# Patient Record
Sex: Male | Born: 1965
Health system: Southern US, Community
[De-identification: ages and names within clinical notes are randomized; demographics above are authoritative.]

## PROBLEM LIST (undated history)

## (undated) DIAGNOSIS — R7881 Bacteremia: Secondary | ICD-10-CM

## (undated) DIAGNOSIS — J189 Pneumonia, unspecified organism: Secondary | ICD-10-CM

## (undated) DIAGNOSIS — N289 Disorder of kidney and ureter, unspecified: Secondary | ICD-10-CM

## (undated) DIAGNOSIS — Z992 Dependence on renal dialysis: Secondary | ICD-10-CM

## (undated) DIAGNOSIS — I10 Essential (primary) hypertension: Secondary | ICD-10-CM

## (undated) HISTORY — PX: CHEST TUBE INSERTION: SHX231

## (undated) HISTORY — PX: AMPUTATION TOE: SHX6595

---

## 2002-08-05 ENCOUNTER — Ambulatory Visit (HOSPITAL_COMMUNITY): Admission: RE | Admit: 2002-08-05 | Discharge: 2002-08-05 | Payer: Self-pay | Admitting: Orthopedic Surgery

## 2002-08-05 ENCOUNTER — Encounter: Payer: Self-pay | Admitting: Orthopedic Surgery

## 2012-02-21 DIAGNOSIS — J189 Pneumonia, unspecified organism: Secondary | ICD-10-CM

## 2012-02-21 HISTORY — DX: Pneumonia, unspecified organism: J18.9

## 2012-03-11 ENCOUNTER — Encounter (HOSPITAL_BASED_OUTPATIENT_CLINIC_OR_DEPARTMENT_OTHER): Payer: Self-pay

## 2012-03-11 ENCOUNTER — Emergency Department (INDEPENDENT_AMBULATORY_CARE_PROVIDER_SITE_OTHER): Payer: Managed Care, Other (non HMO)

## 2012-03-11 ENCOUNTER — Inpatient Hospital Stay (HOSPITAL_BASED_OUTPATIENT_CLINIC_OR_DEPARTMENT_OTHER)
Admission: EM | Admit: 2012-03-11 | Discharge: 2012-03-16 | DRG: 871 | Disposition: A | Discharge status: Home Health | Payer: Managed Care, Other (non HMO) | Attending: Internal Medicine | Admitting: Internal Medicine

## 2012-03-11 DIAGNOSIS — J189 Pneumonia, unspecified organism: Secondary | ICD-10-CM

## 2012-03-11 DIAGNOSIS — D631 Anemia in chronic kidney disease: Secondary | ICD-10-CM | POA: Diagnosis present

## 2012-03-11 DIAGNOSIS — R509 Fever, unspecified: Secondary | ICD-10-CM

## 2012-03-11 DIAGNOSIS — N189 Chronic kidney disease, unspecified: Secondary | ICD-10-CM | POA: Diagnosis present

## 2012-03-11 DIAGNOSIS — E876 Hypokalemia: Secondary | ICD-10-CM | POA: Diagnosis not present

## 2012-03-11 DIAGNOSIS — E119 Type 2 diabetes mellitus without complications: Secondary | ICD-10-CM | POA: Diagnosis present

## 2012-03-11 DIAGNOSIS — B953 Streptococcus pneumoniae as the cause of diseases classified elsewhere: Secondary | ICD-10-CM | POA: Diagnosis present

## 2012-03-11 DIAGNOSIS — J13 Pneumonia due to Streptococcus pneumoniae: Secondary | ICD-10-CM | POA: Diagnosis present

## 2012-03-11 DIAGNOSIS — E1129 Type 2 diabetes mellitus with other diabetic kidney complication: Secondary | ICD-10-CM | POA: Diagnosis present

## 2012-03-11 DIAGNOSIS — E46 Unspecified protein-calorie malnutrition: Secondary | ICD-10-CM | POA: Diagnosis not present

## 2012-03-11 DIAGNOSIS — N179 Acute kidney failure, unspecified: Secondary | ICD-10-CM | POA: Diagnosis not present

## 2012-03-11 DIAGNOSIS — N039 Chronic nephritic syndrome with unspecified morphologic changes: Secondary | ICD-10-CM | POA: Diagnosis present

## 2012-03-11 DIAGNOSIS — R05 Cough: Secondary | ICD-10-CM

## 2012-03-11 DIAGNOSIS — K7200 Acute and subacute hepatic failure without coma: Secondary | ICD-10-CM | POA: Diagnosis present

## 2012-03-11 DIAGNOSIS — R5381 Other malaise: Secondary | ICD-10-CM

## 2012-03-11 DIAGNOSIS — E785 Hyperlipidemia, unspecified: Secondary | ICD-10-CM | POA: Diagnosis present

## 2012-03-11 DIAGNOSIS — J154 Pneumonia due to other streptococci: Secondary | ICD-10-CM | POA: Diagnosis present

## 2012-03-11 DIAGNOSIS — R059 Cough, unspecified: Secondary | ICD-10-CM

## 2012-03-11 DIAGNOSIS — R7989 Other specified abnormal findings of blood chemistry: Secondary | ICD-10-CM | POA: Diagnosis present

## 2012-03-11 DIAGNOSIS — E1165 Type 2 diabetes mellitus with hyperglycemia: Secondary | ICD-10-CM | POA: Diagnosis present

## 2012-03-11 DIAGNOSIS — Y9210 Unspecified residential institution as the place of occurrence of the external cause: Secondary | ICD-10-CM | POA: Diagnosis not present

## 2012-03-11 DIAGNOSIS — R7881 Bacteremia: Secondary | ICD-10-CM | POA: Diagnosis present

## 2012-03-11 DIAGNOSIS — R918 Other nonspecific abnormal finding of lung field: Secondary | ICD-10-CM

## 2012-03-11 DIAGNOSIS — T50995A Adverse effect of other drugs, medicaments and biological substances, initial encounter: Secondary | ICD-10-CM | POA: Diagnosis not present

## 2012-03-11 DIAGNOSIS — R339 Retention of urine, unspecified: Secondary | ICD-10-CM | POA: Diagnosis not present

## 2012-03-11 DIAGNOSIS — R0602 Shortness of breath: Secondary | ICD-10-CM

## 2012-03-11 DIAGNOSIS — A409 Streptococcal sepsis, unspecified: Principal | ICD-10-CM | POA: Diagnosis present

## 2012-03-11 DIAGNOSIS — R079 Chest pain, unspecified: Secondary | ICD-10-CM

## 2012-03-11 DIAGNOSIS — N170 Acute kidney failure with tubular necrosis: Secondary | ICD-10-CM | POA: Diagnosis not present

## 2012-03-11 DIAGNOSIS — E871 Hypo-osmolality and hyponatremia: Secondary | ICD-10-CM | POA: Diagnosis present

## 2012-03-11 LAB — URINALYSIS, ROUTINE W REFLEX MICROSCOPIC
Glucose, UA: >1000 mg/dL — AB
Ketones, ur: 15 mg/dL — AB
Leukocytes, UA: NEGATIVE
Nitrite: NEGATIVE
Protein, ur: >300 mg/dL — AB
Specific Gravity, Urine: 1.027 (ref 1.005–1.030)
Urobilinogen, UA: 4 mg/dL — ABNORMAL HIGH (ref 0.0–1.0)
pH: 5.5 (ref 5.0–8.0)

## 2012-03-11 LAB — BASIC METABOLIC PANEL
BUN: 29 mg/dL — ABNORMAL HIGH (ref 6–23)
CO2: 27 mEq/L (ref 19–32)
Calcium: 8.3 mg/dL — ABNORMAL LOW (ref 8.4–10.5)
Chloride: 87 mEq/L — ABNORMAL LOW (ref 96–112)
Creatinine, Ser: 1.1 mg/dL (ref 0.50–1.35)
GFR calc Af Amer: >90 mL/min (ref >90)
GFR calc non Af Amer: 79 mL/min — ABNORMAL LOW (ref >90)
Glucose, Bld: 358 mg/dL — ABNORMAL HIGH (ref 70–99)
Potassium: 4.2 mEq/L (ref 3.5–5.1)
Sodium: 125 mEq/L — ABNORMAL LOW (ref 135–145)

## 2012-03-11 LAB — CBC
HCT: 28.3 % — ABNORMAL LOW (ref 39.0–52.0)
Hemoglobin: 10 g/dL — ABNORMAL LOW (ref 13.0–17.0)
MCH: 28.6 pg (ref 26.0–34.0)
MCHC: 35.3 g/dL (ref 30.0–36.0)
MCV: 80.9 fL (ref 78.0–100.0)
Platelets: 224 10*3/uL (ref 150–400)
RBC: 3.5 MIL/uL — ABNORMAL LOW (ref 4.22–5.81)
RDW: 10.9 % — ABNORMAL LOW (ref 11.5–15.5)
WBC: 10.1 10*3/uL (ref 4.0–10.5)

## 2012-03-11 LAB — GLUCOSE, CAPILLARY
Glucose-Capillary: 371 mg/dL — ABNORMAL HIGH (ref 70–99)
Glucose-Capillary: 303 mg/dL — ABNORMAL HIGH (ref 70–99)

## 2012-03-11 LAB — URINE MICROSCOPIC-ADD ON

## 2012-03-11 MED ORDER — INSULIN REGULAR HUMAN 100 UNIT/ML IJ SOLN
10.0000 [IU] | Freq: Once | INTRAMUSCULAR | Status: AC
  Administered 2012-03-11: 10 [IU] via INTRAVENOUS

## 2012-03-11 MED ORDER — HYDROCODONE-ACETAMINOPHEN 5-325 MG PO TABS
1.0000 | ORAL_TABLET | Freq: Once | ORAL | Status: AC
  Administered 2012-03-11: 1 via ORAL
  Filled 2012-03-11: qty 1

## 2012-03-11 MED ORDER — ACETAMINOPHEN 325 MG PO TABS
650.0000 mg | ORAL_TABLET | Freq: Once | ORAL | Status: AC
  Administered 2012-03-11: 650 mg via ORAL
  Filled 2012-03-11: qty 2

## 2012-03-11 MED ORDER — DEXTROSE 5 % IV SOLN
500.0000 mg | Freq: Once | INTRAVENOUS | Status: AC
  Administered 2012-03-11: 500 mg via INTRAVENOUS
  Filled 2012-03-11: qty 500

## 2012-03-11 MED ORDER — DEXTROSE 5 % IV SOLN
1.0000 g | Freq: Once | INTRAVENOUS | Status: AC
  Administered 2012-03-11: 1 g via INTRAVENOUS
  Filled 2012-03-11: qty 10

## 2012-03-11 MED ORDER — HYDROCODONE-ACETAMINOPHEN 5-325 MG PO TABS
1.0000 | ORAL_TABLET | Freq: Once | ORAL | Status: AC
  Administered 2012-03-11: 1 via ORAL

## 2012-03-11 MED ORDER — IOHEXOL 350 MG/ML SOLN
80.0000 mL | Freq: Once | INTRAVENOUS | Status: AC | PRN
  Administered 2012-03-11: 80 mL via INTRAVENOUS

## 2012-03-11 MED ORDER — HYDROCODONE-ACETAMINOPHEN 5-325 MG PO TABS
ORAL_TABLET | ORAL | Status: AC
  Filled 2012-03-11: qty 1

## 2012-03-11 MED ORDER — SODIUM CHLORIDE 0.9 % IV BOLUS (SEPSIS)
500.0000 mL | Freq: Once | INTRAVENOUS | Status: AC
  Administered 2012-03-11: 13:00:00 via INTRAVENOUS

## 2012-03-11 MED ORDER — SODIUM CHLORIDE 0.9 % IV BOLUS (SEPSIS)
1000.0000 mL | Freq: Once | INTRAVENOUS | Status: AC
  Administered 2012-03-11: 1000 mL via INTRAVENOUS

## 2012-03-11 MED ORDER — INSULIN REGULAR HUMAN 100 UNIT/ML IJ SOLN
INTRAMUSCULAR | Status: AC
  Filled 2012-03-11: qty 10

## 2012-03-11 NOTE — ED Notes (Signed)
EMTALA consent obtained from pt. Awaiting admission bed.  Pt and family informed of plan of care.

## 2012-03-11 NOTE — ED Notes (Signed)
Pt reports generalized weakness, weight loss, fever, cough and recent diagnosis of Influenza A.  Pt is present ly being treated with PO medications but not getting better.

## 2012-03-11 NOTE — ED Notes (Signed)
Pt returned from radiology.

## 2012-03-11 NOTE — ED Notes (Signed)
Pt reports illness since 03/08/12, diagnosed with ear infection and flu.  Taking Tamiflu but not improving.

## 2012-03-11 NOTE — ED Notes (Signed)
Report given to floor. Spoke to [employer]. [facility] also arrived for transport.

## 2012-03-11 NOTE — ED Notes (Signed)
Patient transported to CT via stretcher.

## 2012-03-11 NOTE — ED Provider Notes (Signed)
History     CSN: [id number]  Arrival date & time 03/11/12  1230   First MD Initiated Contact with Patient 03/11/12 1240      Chief Complaint  Patient presents with  . Abdominal Pain  . Weakness  . Cough  . Fever    (Consider location/radiation/quality/duration/timing/severity/associated sxs/prior treatment) HPI Comments: Pt states that he as diagnosed with the flu 3 days ago at [facility] and has been taking tamiflu and has not been feeling better:pt states that he went to see his pcp this morning and was sent over here for further evaluation;Pt states that he had a lot of vomiting 2 days ago, but has not had any in the last 24 hours:pt states that his blood sugar was in the 300's at his pcp today:pt states that he is having right lower rib pain and generalized weakness  Patient is a 46 y.o. male presenting with fever. The history is provided by the patient and a relative.  Fever Primary symptoms of the febrile illness include fever, cough, vomiting and myalgias. Primary symptoms do not include shortness of breath, abdominal pain, diarrhea, dysuria or rash. The current episode started 3 to 5 days ago. This is a new problem. The problem has been gradually worsening.  Risk factors for febrile illness include diabetes mellitus.   Past Medical History  Diagnosis Date  . Flu   . Diabetes mellitus     History reviewed. No pertinent past surgical history.  No family history on file.  History  Substance Use Topics  . Smoking status: Never Smoker   . Smokeless tobacco: Never Used  . Alcohol Use: No      Review of Systems  Constitutional: Positive for fever.  Respiratory: Positive for cough. Negative for shortness of breath.   Gastrointestinal: Positive for vomiting. Negative for abdominal pain and diarrhea.  Genitourinary: Negative for dysuria.  Musculoskeletal: Positive for myalgias.  Skin: Negative for rash.  All other systems reviewed and are negative.    Allergies    Penicillins  Home Medications   Current Outpatient Rx  Name Route Sig Dispense Refill  . ASPIRIN 81 MG PO TABS Oral Take 81 mg by mouth daily.    . AZITHROMYCIN 250 MG PO TABS Oral Take 250 mg by mouth daily.    . INSULIN ISOPHANE HUMAN 100 UNIT/ML [place] SUSP Subcutaneous Inject 15 Units into the skin.    . INSULIN REGULAR HUMAN 100 UNIT/ML IJ SOLN Subcutaneous Inject 15 Units into the skin every morning.    Marland Kitchen LISINOPRIL 5 MG PO TABS Oral Take 5 mg by mouth daily.    . MULTIVITAMINS PO CAPS Oral Take 1 capsule by mouth daily.    . OSELTAMIVIR PHOSPHATE 75 MG PO CAPS Oral Take 75 mg by mouth 2 (two) times daily.    Marland Kitchen ROSUVASTATIN CALCIUM 10 MG PO TABS Oral Take 10 mg by mouth daily.      BP 142/90  Pulse 124  Temp(Src) 100.2 F (37.9 C) (Oral)  Resp 20  Ht 6\' 2"  (1.88 m)  Wt 185 lb (83.915 kg)  BMI 23.75 kg/m2  SpO2 91%  Physical Exam  Nursing note and vitals reviewed. Constitutional: He is oriented to person, place, and time. He appears well-developed and well-nourished.  HENT:  Head: Normocephalic and atraumatic.  Right Ear: External ear normal.  Left Ear: External ear normal.  Mouth/Throat: Oropharynx is clear and moist.  Eyes: Conjunctivae and EOM are normal.  Neck: Neck supple.  Cardiovascular: Normal rate and  regular rhythm.   Pulmonary/Chest: Effort normal. He exhibits tenderness.       Pt tender on the right lower ribs:decreased lung sounds on the  Right middle and lower lobe  Abdominal: Soft. Bowel sounds are normal. There is no tenderness.  Musculoskeletal: Normal range of motion.  Neurological: He is alert and oriented to person, place, and time.  Skin: Skin is warm and dry.  Psychiatric: He has a normal mood and affect.    ED Course  Procedures (including critical care time)  Labs Reviewed  CBC - Abnormal; Notable for the following:    RBC 3.50 (*)    Hemoglobin 10.0 (*)    HCT 28.3 (*)    RDW 10.9 (*)    All other components within normal limits   BASIC METABOLIC PANEL - Abnormal; Notable for the following:    Sodium 125 (*)    Chloride 87 (*)    Glucose, Bld 358 (*)    BUN 29 (*)    Calcium 8.3 (*)    GFR calc non Af Amer 79 (*)    All other components within normal limits  URINALYSIS, ROUTINE W REFLEX MICROSCOPIC  CULTURE, BLOOD (ROUTINE X 2)  CULTURE, BLOOD (ROUTINE X 2)   Dg Chest 2 View  03/11/2012  *RADIOLOGY REPORT*  Clinical Data: Shortness of breath with right lower chest pain and fever.  CHEST - 2 VIEW  Comparison: None.  Findings: Trachea is midline.  Heart size normal.  There is dense airspace consolidation in the right lower lobe.  Question mild left lower lobe air space disease.  No left pleural fluid.  IMPRESSION:  1.  Dense airspace consolidation in the right lower lobe is most consistent with pneumonia.  Follow-up to clearing is recommended, to exclude a centrally obstructing mass. 2.  Question mild left lower lobe air space disease.  Original Report Authenticated By: Luretha Rued, M.D.     1. Community acquired pneumonia   2. Diabetes mellitus       MDM  5:16 PM Dr reddy accepted but is requesting ct chest 5:58 PM Ct angio negative:pt to go to medical floor:pt needs admission as is unable to hold oxygen saturation        Glendell Docker, NP 03/11/12 1759

## 2012-03-11 NOTE — ED Notes (Signed)
Glendell Docker, FNP at bedside speaking with pt.

## 2012-03-11 NOTE — ED Notes (Signed)
Pt's girlfriend, Honor Loh, to be notified once pt is transferred. (home#) [phone] or [phone]. Pt given ice water to drink. C/o continued right rib pain. NP made aware and no new orders given.

## 2012-03-11 NOTE — ED Notes (Signed)
No available admission beds at this time.

## 2012-03-11 NOTE — ED Notes (Signed)
Pt returns from CT.  No change in pt condition. Awaiting admission bed.

## 2012-03-11 NOTE — ED Notes (Signed)
Patient transported to X-ray 

## 2012-03-12 ENCOUNTER — Encounter (HOSPITAL_COMMUNITY): Payer: Self-pay | Admitting: Internal Medicine

## 2012-03-12 ENCOUNTER — Inpatient Hospital Stay (HOSPITAL_COMMUNITY): Payer: Managed Care, Other (non HMO)

## 2012-03-12 DIAGNOSIS — J13 Pneumonia due to Streptococcus pneumoniae: Secondary | ICD-10-CM | POA: Diagnosis present

## 2012-03-12 DIAGNOSIS — E119 Type 2 diabetes mellitus without complications: Secondary | ICD-10-CM | POA: Diagnosis present

## 2012-03-12 LAB — PROTIME-INR
INR: 1.12 (ref 0.00–1.49)
Prothrombin Time: 14.6 seconds (ref 11.6–15.2)

## 2012-03-12 LAB — URINALYSIS, ROUTINE W REFLEX MICROSCOPIC
Glucose, UA: >1000 mg/dL — AB
Ketones, ur: NEGATIVE mg/dL
Nitrite: NEGATIVE
Protein, ur: >300 mg/dL — AB
Specific Gravity, Urine: 1.039 — ABNORMAL HIGH (ref 1.005–1.030)
Urobilinogen, UA: 2 mg/dL — ABNORMAL HIGH (ref 0.0–1.0)
pH: 5 (ref 5.0–8.0)

## 2012-03-12 LAB — COMPREHENSIVE METABOLIC PANEL
ALT: 78 U/L — ABNORMAL HIGH (ref 0–53)
AST: 60 U/L — ABNORMAL HIGH (ref 0–37)
Albumin: 1.6 g/dL — ABNORMAL LOW (ref 3.5–5.2)
Alkaline Phosphatase: 390 U/L — ABNORMAL HIGH (ref 39–117)
BUN: 50 mg/dL — ABNORMAL HIGH (ref 6–23)
CO2: 21 mEq/L (ref 19–32)
Calcium: 8.1 mg/dL — ABNORMAL LOW (ref 8.4–10.5)
Chloride: 89 mEq/L — ABNORMAL LOW (ref 96–112)
Creatinine, Ser: 2 mg/dL — ABNORMAL HIGH (ref 0.50–1.35)
GFR calc Af Amer: 45 mL/min — ABNORMAL LOW (ref >90)
GFR calc non Af Amer: 39 mL/min — ABNORMAL LOW (ref >90)
Glucose, Bld: 371 mg/dL — ABNORMAL HIGH (ref 70–99)
Potassium: 3.9 mEq/L (ref 3.5–5.1)
Sodium: 127 mEq/L — ABNORMAL LOW (ref 135–145)
Total Bilirubin: 3.2 mg/dL — ABNORMAL HIGH (ref 0.3–1.2)
Total Protein: 6.4 g/dL (ref 6.0–8.3)

## 2012-03-12 LAB — HIV ANTIBODY (ROUTINE TESTING W REFLEX): HIV: NONREACTIVE

## 2012-03-12 LAB — URINE MICROSCOPIC-ADD ON

## 2012-03-12 LAB — CBC
HCT: 26 % — ABNORMAL LOW (ref 39.0–52.0)
Hemoglobin: 8.9 g/dL — ABNORMAL LOW (ref 13.0–17.0)
MCH: 28.5 pg (ref 26.0–34.0)
MCHC: 34.2 g/dL (ref 30.0–36.0)
MCV: 83.3 fL (ref 78.0–100.0)
Platelets: 233 10*3/uL (ref 150–400)
RBC: 3.12 MIL/uL — ABNORMAL LOW (ref 4.22–5.81)
RDW: 12.1 % (ref 11.5–15.5)
WBC: 14.8 10*3/uL — ABNORMAL HIGH (ref 4.0–10.5)

## 2012-03-12 LAB — GLUCOSE, CAPILLARY
Glucose-Capillary: 410 mg/dL — ABNORMAL HIGH (ref 70–99)
Glucose-Capillary: 357 mg/dL — ABNORMAL HIGH (ref 70–99)
Glucose-Capillary: 344 mg/dL — ABNORMAL HIGH (ref 70–99)
Glucose-Capillary: 333 mg/dL — ABNORMAL HIGH (ref 70–99)

## 2012-03-12 LAB — SODIUM, URINE, RANDOM: Sodium, Ur: <10 mEq/L

## 2012-03-12 LAB — HEMOGLOBIN A1C
Hgb A1c MFr Bld: 15.7 % — ABNORMAL HIGH (ref <5.7)
Mean Plasma Glucose: 404 mg/dL — ABNORMAL HIGH (ref <117)

## 2012-03-12 LAB — AMMONIA: Ammonia: 22 umol/L (ref 11–60)

## 2012-03-12 MED ORDER — ATORVASTATIN CALCIUM 20 MG PO TABS
20.0000 mg | ORAL_TABLET | Freq: Every day | ORAL | Status: DC
  Filled 2012-03-12: qty 1

## 2012-03-12 MED ORDER — ENOXAPARIN SODIUM 40 MG/0.4ML ~~LOC~~ SOLN
40.0000 mg | SUBCUTANEOUS | Status: DC
  Administered 2012-03-12 – 2012-03-16 (×4): 40 mg via SUBCUTANEOUS
  Filled 2012-03-12 (×5): qty 0.4

## 2012-03-12 MED ORDER — INSULIN GLARGINE 100 UNIT/ML ~~LOC~~ SOLN
10.0000 [IU] | Freq: Every day | SUBCUTANEOUS | Status: DC
  Administered 2012-03-12: 10 [IU] via SUBCUTANEOUS

## 2012-03-12 MED ORDER — ASPIRIN 81 MG PO CHEW
81.0000 mg | CHEWABLE_TABLET | Freq: Every day | ORAL | Status: DC
  Administered 2012-03-12 – 2012-03-16 (×5): 81 mg via ORAL
  Filled 2012-03-12 (×5): qty 1

## 2012-03-12 MED ORDER — INSULIN ASPART 100 UNIT/ML ~~LOC~~ SOLN
0.0000 [IU] | Freq: Every day | SUBCUTANEOUS | Status: DC
  Administered 2012-03-12: 4 [IU] via SUBCUTANEOUS
  Administered 2012-03-13 – 2012-03-14 (×2): 3 [IU] via SUBCUTANEOUS
  Administered 2012-03-15: 4 [IU] via SUBCUTANEOUS

## 2012-03-12 MED ORDER — INSULIN ASPART 100 UNIT/ML ~~LOC~~ SOLN: 15.0000 [IU] | Freq: Every day | SUBCUTANEOUS | Status: DC

## 2012-03-12 MED ORDER — INSULIN ASPART 100 UNIT/ML ~~LOC~~ SOLN
10.0000 [IU] | Freq: Once | SUBCUTANEOUS | Status: AC
  Administered 2012-03-12: 10 [IU] via SUBCUTANEOUS

## 2012-03-12 MED ORDER — OSELTAMIVIR PHOSPHATE 75 MG PO CAPS
75.0000 mg | ORAL_CAPSULE | Freq: Two times a day (BID) | ORAL | Status: DC
  Administered 2012-03-12: 75 mg via ORAL
  Filled 2012-03-12 (×3): qty 1

## 2012-03-12 MED ORDER — ONDANSETRON HCL 4 MG PO TABS: 4.0000 mg | ORAL_TABLET | Freq: Four times a day (QID) | ORAL | Status: DC | PRN

## 2012-03-12 MED ORDER — INSULIN ASPART 100 UNIT/ML ~~LOC~~ SOLN
0.0000 [IU] | Freq: Three times a day (TID) | SUBCUTANEOUS | Status: DC
  Administered 2012-03-12: 15 [IU] via SUBCUTANEOUS
  Administered 2012-03-12 – 2012-03-13 (×2): 11 [IU] via SUBCUTANEOUS
  Administered 2012-03-13: 15 [IU] via SUBCUTANEOUS
  Administered 2012-03-13 – 2012-03-14 (×2): 11 [IU] via SUBCUTANEOUS
  Administered 2012-03-14: 5 [IU] via SUBCUTANEOUS
  Administered 2012-03-15: 8 [IU] via SUBCUTANEOUS
  Administered 2012-03-15: 5 [IU] via SUBCUTANEOUS
  Administered 2012-03-16: 11 [IU] via SUBCUTANEOUS
  Administered 2012-03-16: 8 [IU] via SUBCUTANEOUS

## 2012-03-12 MED ORDER — DEXTROSE 5 % IV SOLN
1.0000 g | INTRAVENOUS | Status: DC
  Administered 2012-03-12: 1 g via INTRAVENOUS
  Filled 2012-03-12 (×2): qty 10

## 2012-03-12 MED ORDER — ACETAMINOPHEN 650 MG RE SUPP: 650.0000 mg | Freq: Four times a day (QID) | RECTAL | Status: DC | PRN

## 2012-03-12 MED ORDER — INSULIN ASPART 100 UNIT/ML ~~LOC~~ SOLN: 0.0000 [IU] | Freq: Three times a day (TID) | SUBCUTANEOUS | Status: DC

## 2012-03-12 MED ORDER — ONDANSETRON HCL 4 MG/2ML IJ SOLN
4.0000 mg | Freq: Four times a day (QID) | INTRAMUSCULAR | Status: DC | PRN
  Administered 2012-03-13: 4 mg via INTRAVENOUS
  Filled 2012-03-12: qty 2

## 2012-03-12 MED ORDER — ADULT MULTIVITAMIN W/MINERALS CH
1.0000 | ORAL_TABLET | Freq: Every day | ORAL | Status: DC
  Administered 2012-03-12 – 2012-03-16 (×5): 1 via ORAL
  Filled 2012-03-12 (×5): qty 1

## 2012-03-12 MED ORDER — INSULIN NPH (HUMAN) (ISOPHANE) 100 UNIT/ML ~~LOC~~ SUSP
35.0000 [IU] | Freq: Two times a day (BID) | SUBCUTANEOUS | Status: DC
  Filled 2012-03-12: qty 10

## 2012-03-12 MED ORDER — INSULIN REGULAR HUMAN 100 UNIT/ML IJ SOLN: 10.0000 [IU] | Freq: Once | INTRAMUSCULAR | Status: DC

## 2012-03-12 MED ORDER — LISINOPRIL 5 MG PO TABS
5.0000 mg | ORAL_TABLET | Freq: Every day | ORAL | Status: DC
  Administered 2012-03-12: 5 mg via ORAL
  Filled 2012-03-12: qty 1

## 2012-03-12 MED ORDER — ACETAMINOPHEN 325 MG PO TABS: 650.0000 mg | ORAL_TABLET | Freq: Four times a day (QID) | ORAL | Status: DC | PRN

## 2012-03-12 MED ORDER — SODIUM CHLORIDE 0.9 % IV SOLN
INTRAVENOUS | Status: DC
  Administered 2012-03-12 – 2012-03-13 (×5): via INTRAVENOUS

## 2012-03-12 MED ORDER — INSULIN REGULAR HUMAN 100 UNIT/ML IJ SOLN: 15.0000 [IU] | Freq: Every morning | INTRAMUSCULAR | Status: DC

## 2012-03-12 MED ORDER — OXYCODONE HCL 5 MG PO TABS: 5.0000 mg | ORAL_TABLET | ORAL | Status: DC | PRN

## 2012-03-12 MED ORDER — AZITHROMYCIN 500 MG IV SOLR
500.0000 mg | INTRAVENOUS | Status: DC
  Administered 2012-03-12: 500 mg via INTRAVENOUS
  Filled 2012-03-12 (×2): qty 500

## 2012-03-12 MED ORDER — SODIUM CHLORIDE 0.9 % IV SOLN
750.0000 mg | Freq: Two times a day (BID) | INTRAVENOUS | Status: DC
  Administered 2012-03-12 – 2012-03-13 (×2): 750 mg via INTRAVENOUS
  Filled 2012-03-12 (×3): qty 750

## 2012-03-12 MED ORDER — INSULIN GLARGINE 100 UNIT/ML ~~LOC~~ SOLN: 5.0000 [IU] | Freq: Every day | SUBCUTANEOUS | Status: DC

## 2012-03-12 NOTE — Progress Notes (Signed)
ANTIBIOTIC CONSULT NOTE - INITIAL  Pharmacy Consult for Vancomycin Indication: Gram positive cocci Sepsis and  Community-acquired pneumonia    Allergies  Allergen Reactions  . Penicillins Rash    Patient Measurements: Height: 6' 0.2" (183.4 cm) Weight: 185 lb 10 oz (84.2 kg) IBW/kg (Calculated) : 78.06    Vital Signs: Temp: 99.5 F (37.5 C) (03/21 0459) Temp src: Oral (03/21 0459) BP: 159/98 mmHg (03/21 1039) Pulse Rate: 94  (03/21 1039) Intake/Output from previous day: 03/20 0701 - 03/21 0700 In: 1300 [I.V.:1300] Out: 200 [Urine:200] Intake/Output from this shift: Total I/O In: 118 [P.O.:118] Out: -   Labs:  Basename 03/12/12 0940 03/11/12 1300  WBC 14.8* 10.1  HGB 8.9* 10.0*  PLT 233 224  LABCREA -- --  CREATININE 2.00* 1.10   Estimated Creatinine Clearance: 51.5 ml/min (by C-G formula based on Cr of 2). No results found for this basename: VANCOTROUGH:2,VANCOPEAK:2,VANCORANDOM:2,GENTTROUGH:2,GENTPEAK:2,GENTRANDOM:2,TOBRATROUGH:2,TOBRAPEAK:2,TOBRARND:2,AMIKACINPEAK:2,AMIKACINTROU:2,AMIKACIN:2, in the last 72 hours   Microbiology: Recent Results (from the past 720 hour(s))  CULTURE, BLOOD (ROUTINE X 2)     Status: Normal (Preliminary result)   Collection Time   03/11/12  1:00 PM      Component Value Range Status Comment   Specimen Description BLOOD RIGHT ANTECUBITAL   Final    Special Requests NONE BOTTLES DRAWN AEROBIC AND ANAEROBIC [facility]   Final    Culture  Setup Time [id number]   Final    Culture     Final    Value: GRAM POSITIVE COCCI IN PAIRS     Note: Gram Stain Report Called to,Read Back By and Verified With: COURTNEY DRIGGERS @ [id number] 03/12/12 WICKN   Report Status PENDING   Incomplete   CULTURE, BLOOD (ROUTINE X 2)     Status: Normal (Preliminary result)   Collection Time   03/11/12  2:10 PM      Component Value Range Status Comment   Specimen Description BLOOD RIGHT HAND   Final    Special Requests NONE BOTTLES DRAWN AEROBIC AND ANAEROBIC [facility]   Final    Culture  Setup Time [id number]   Final    Culture     Final    Value: GRAM POSITIVE COCCI IN PAIRS     Note: Gram Stain Report Called to,Read Back By and Verified WithHuel Cote @ [id number] 03/12/12 WICKN   Report Status PENDING   Incomplete     Medical History: Past Medical History  Diagnosis Date  . Flu   . Diabetes mellitus     Medications:  Prescriptions prior to admission  Medication Sig Dispense Refill  . aspirin 81 MG tablet Take 81 mg by mouth daily.      Marland Kitchen azithromycin (ZITHROMAX) 250 MG tablet Take 250 mg by mouth daily.      . insulin NPH (HUMULIN N,NOVOLIN N) 100 UNIT/ML injection Inject 15 Units into the skin.      Marland Kitchen insulin regular (NOVOLIN R,HUMULIN R) 100 units/mL injection Inject 15 Units into the skin every morning.      Marland Kitchen lisinopril (PRINIVIL,ZESTRIL) 5 MG tablet Take 5 mg by mouth daily.      . Multiple Vitamin (MULTIVITAMIN) capsule Take 1 capsule by mouth daily.      Marland Kitchen oseltamivir (TAMIFLU) 75 MG capsule Take 75 mg by mouth 2 (two) times daily.      . rosuvastatin (CRESTOR) 10 MG tablet Take 10 mg by mouth daily.       Scheduled:    . aspirin  81 mg  Oral Daily  . azithromycin  500 mg Intravenous Once  . azithromycin  500 mg Intravenous Q24H  . cefTRIAXone (ROCEPHIN)  IV  1 g Intravenous Once  . cefTRIAXone (ROCEPHIN)  IV  1 g Intravenous Q24H  . enoxaparin  40 mg Subcutaneous Q24H  . HYDROcodone-acetaminophen  1 tablet Oral Once  . HYDROcodone-acetaminophen  1 tablet Oral Once  . insulin aspart  0-15 Units Subcutaneous TID WC  . insulin aspart  0-5 Units Subcutaneous QHS  . insulin aspart  10 Units Subcutaneous Once  . insulin glargine  5 Units Subcutaneous QHS  . insulin regular  10 Units Intravenous Once  . mulitivitamin with minerals  1 tablet Oral Daily  . sodium chloride  1,000 mL Intravenous Once  . DISCONTD: atorvastatin  20 mg Oral q1800  . DISCONTD: insulin aspart  0-9 Units Subcutaneous TID WC  . DISCONTD: insulin  aspart  15 Units Subcutaneous Q breakfast  . DISCONTD: insulin NPH  35 Units Subcutaneous BID  . DISCONTD: insulin regular  10 Units Intravenous Once  . DISCONTD: insulin regular  15 Units Subcutaneous q morning - 10a  . DISCONTD: lisinopril  5 mg Oral Daily  . DISCONTD: oseltamivir  75 mg Oral BID   Anti-infectives     Start     Dose/Rate Route Frequency Ordered Stop   03/12/12 1500   azithromycin (ZITHROMAX) 500 mg in dextrose 5 % 250 mL IVPB        500 mg 250 mL/hr over 60 Minutes Intravenous Every 24 hours 03/12/12 0322     03/12/12 1400   cefTRIAXone (ROCEPHIN) 1 g in dextrose 5 % 50 mL IVPB        1 g 100 mL/hr over 30 Minutes Intravenous Every 24 hours 03/12/12 0322     03/12/12 0330   oseltamivir (TAMIFLU) capsule 75 mg  Status:  Discontinued     Comments: Only for today.      75 mg Oral 2 times daily 03/12/12 0322 03/12/12 1248   03/11/12 1345   cefTRIAXone (ROCEPHIN) 1 g in dextrose 5 % 50 mL IVPB        1 g 100 mL/hr over 30 Minutes Intravenous  Once 03/11/12 1336 03/11/12 1451   03/11/12 1345   azithromycin (ZITHROMAX) 500 mg in dextrose 5 % 250 mL IVPB        500 mg 250 mL/hr over 60 Minutes Intravenous  Once 03/11/12 1336 03/11/12 1640         Assessment: 46 y.o. Male with CAP and blood cultures growing GPC.  Currently on IV azithromycin and Ceftriaxone.  Goal of Therapy:  Vancomycin trough level 15-20 mcg/ml  Plan:  Vancomycin 750mg  IV q12h.  Monitor clinical status, rena fxn, culture sensitivies, vancomycin trough at steady state.   Arman Bogus 03/12/2012,1:41 PM

## 2012-03-12 NOTE — ED Provider Notes (Signed)
Medical screening examination/treatment/procedure(s) were performed by non-physician practitioner and as supervising physician I was immediately available for consultation/collaboration.    Dot Lanes, MD 03/12/12 [phone]

## 2012-03-12 NOTE — Progress Notes (Signed)
Inpatient Diabetes Program Recommendations  AACE/ADA: New Consensus Statement on Inpatient Glycemic Control (2009)  Target Ranges:  Prepandial:   less than 140 mg/dL      Peak postprandial:   less than 180 mg/dL (1-2 hours)      Critically ill patients:  140 - 180 mg/dL   Reason for assessment: Hyperglycemia CBGs 410, 357  Inpatient Diabetes Program Recommendations Insulin - Basal: Increase Lantus to 15 units HgbA1C: pending  Note: Recommend 1st dose Lantus now instead of waiting until tonight.  Will continue to follow during this admission. Text paged Patrici Ranks NP and she will order a dose now.  Thank you!   Raoul Pitch RN,BSN,CDE Inpatient Diabetes Coordinator [phone]

## 2012-03-12 NOTE — Progress Notes (Signed)
I have personally seen and examined [name]  I agree with PE/Ap as per [name] [name] note [name] [name]

## 2012-03-12 NOTE — Progress Notes (Signed)
PATIENT DETAILS Name: [name] [name] Age: 45 y.o. Sex: male Date of Birth: 1966/08/07 Admit Date: 03/11/2012 NP:[id number], MD, MD POA:   CONSULTS: None  Subjective: Feeling a little better this am. Current home insulin regimen was started bc he did not have health benefits at the time. He does have rx drug coverage now. States his A1C's have been "high" for quite sometime, but uncertain of number. Pain in right side worse with movement. No hx of tobacco abuse. Reports ~15lb unintentional wt loss over last 3 months. No reported hx of liver disease. No recent abdominal pain, n/v  Objective: Vital signs in last 24 hours: Temp:  [99.1 F (37.3 C)-101.3 F (38.5 C)] 99.5 F (37.5 C) (03/21 0459) Pulse Rate:  [94-124] 94  (03/21 1039) Resp:  [16-22] 16  (03/21 0459) BP: (104-170)/(72-98) 159/98 mmHg (03/21 1039) SpO2:  [81 %-100 %] 92 % (03/21 0459) Weight:  [83.915 kg (185 lb)-84.2 kg (185 lb 10 oz)] 84.2 kg (185 lb 10 oz) (03/21 0012) Weight change:  Last BM Date: 03/11/12  Intake/Output from previous day:  Intake/Output Summary (Last 24 hours) at 03/12/12 1213 Last data filed at 03/12/12 0900  Gross per 24 hour  Intake   1418 ml  Output    200 ml  Net   1218 ml     Physical Exam:  Gen:  Awake, alert sitting upright in bed in NAD Cardiovascular:  S1S2 RRR. No m/r/g, no increased wob Respiratory: No increased wob. LL rales, diminished RLL Gastrointestinal: abdomen soft, NT/ND, BS+, no appreciated masses or hsm Extremities: no c/c/e   Lab Results:  Lab 03/12/12 0940 03/11/12 1300  HGB 8.9* 10.0*  HCT 26.0* 28.3*  WBC 14.8* 10.1  PLT 233 224     Lab 03/12/12 0940 03/11/12 1300  NA 127* 125*  K 3.9 4.2  CL 89* 87*  CO2 21 27  GLUCOSE 371* 358*  BUN 50* 29*  CREATININE 2.00* 1.10  CALCIUM 8.1* 8.3*  MG -- --  PHOS -- --    Studies/Results: Dg Chest 2 View  03/11/2012  *RADIOLOGY REPORT*  Clinical Data: Shortness of breath with right lower  chest pain and fever.  CHEST - 2 VIEW  Comparison: None.  Findings: Trachea is midline.  Heart size normal.  There is dense airspace consolidation in the right lower lobe.  Question mild left lower lobe air space disease.  No left pleural fluid.  IMPRESSION:  1.  Dense airspace consolidation in the right lower lobe is most consistent with pneumonia.  Follow-up to clearing is recommended, to exclude a centrally obstructing mass. 2.  Question mild left lower lobe air space disease.  Original Report Authenticated By: Luretha Rued, M.D.   Ct Angio Chest W/cm &/or Wo Cm  03/11/2012  *RADIOLOGY REPORT*  Clinical Data: Fever.  Cough.  Weakness.  Short of breath.  CT ANGIOGRAPHY CHEST  Technique:  Multidetector CT imaging of the chest using the standard protocol during bolus administration of intravenous contrast. Multiplanar reconstructed images including MIPs were obtained and reviewed to evaluate the vascular anatomy.  Contrast: 31mL OMNIPAQUE IOHEXOL 350 MG/ML IV SOLN  Comparison: Chest radiography same day  Findings: Pulmonary arterial opacification is excellent.  There are no pulmonary emboli.  There is consolidative pneumonia throughout the right lower lobe with minor involvement of the right upper lobe.  There is consolidative pneumonia less extensively in the posterior aspect of the left lower lobe.  There is a small amount of pleural fluid on  the right.  There is a tiny amount of pericardial fluid.  There are small mediastinal lymph nodes, likely reactive.  No upper abdominal pathology is seen.  IMPRESSION: No pulmonary emboli.  Consolidative pneumonia throughout the right lower lobe.  Partial involvement of the left lower lobe by consolidative  pneumonia. Minimal involvement of the right upper lobe.  Original Report Authenticated By: Jules Schick, M.D.    Medications: Scheduled Meds:    . acetaminophen  650 mg Oral Once  . aspirin  81 mg Oral Daily  . atorvastatin  20 mg Oral q1800  .  azithromycin  500 mg Intravenous Once  . azithromycin  500 mg Intravenous Q24H  . cefTRIAXone (ROCEPHIN)  IV  1 g Intravenous Once  . cefTRIAXone (ROCEPHIN)  IV  1 g Intravenous Q24H  . enoxaparin  40 mg Subcutaneous Q24H  . HYDROcodone-acetaminophen  1 tablet Oral Once  . HYDROcodone-acetaminophen  1 tablet Oral Once  . insulin aspart  0-15 Units Subcutaneous TID WC  . insulin aspart  0-5 Units Subcutaneous QHS  . insulin aspart  10 Units Subcutaneous Once  . insulin glargine  5 Units Subcutaneous QHS  . insulin regular  10 Units Intravenous Once  . lisinopril  5 mg Oral Daily  . mulitivitamin with minerals  1 tablet Oral Daily  . oseltamivir  75 mg Oral BID  . sodium chloride  1,000 mL Intravenous Once  . sodium chloride  500 mL Intravenous Once  . DISCONTD: insulin aspart  0-9 Units Subcutaneous TID WC  . DISCONTD: insulin aspart  15 Units Subcutaneous Q breakfast  . DISCONTD: insulin NPH  35 Units Subcutaneous BID  . DISCONTD: insulin regular  10 Units Intravenous Once  . DISCONTD: insulin regular  15 Units Subcutaneous q morning - 10a   Continuous Infusions:    . sodium chloride 75 mL/hr at 03/12/12 0418   PRN Meds:.acetaminophen, acetaminophen, iohexol, ondansetron (ZOFRAN) IV, ondansetron, oxyCODONE Antibiotics: Anti-infectives     Start     Dose/Rate Route Frequency Ordered Stop   03/12/12 1500   azithromycin (ZITHROMAX) 500 mg in dextrose 5 % 250 mL IVPB        500 mg 250 mL/hr over 60 Minutes Intravenous Every 24 hours 03/12/12 0322     03/12/12 1400   cefTRIAXone (ROCEPHIN) 1 g in dextrose 5 % 50 mL IVPB        1 g 100 mL/hr over 30 Minutes Intravenous Every 24 hours 03/12/12 0322     03/12/12 0330   oseltamivir (TAMIFLU) capsule 75 mg     Comments: Only for today.      75 mg Oral 2 times daily 03/12/12 0322 03/16/12 2159   03/11/12 1345   cefTRIAXone (ROCEPHIN) 1 g in dextrose 5 % 50 mL IVPB        1 g 100 mL/hr over 30 Minutes Intravenous  Once 03/11/12  1336 03/11/12 1451   03/11/12 1345   azithromycin (ZITHROMAX) 500 mg in dextrose 5 % 250 mL IVPB        500 mg 250 mL/hr over 60 Minutes Intravenous  Once 03/11/12 1336 03/11/12 1640           Assessment/Plan:  Active Problems:  Community acquired pneumonia  Diabetes mellitus  1. Gram + sepsis/CAP: continue Rocephin and Azith D1. Add Vancomycin. D/C Tamiflu tomorrow. VSS. Pt looks ill, but not acutely so. Monitor closely.   2. DM2, uncontrolled prior to admission: Will change insulin regimen now that pt  has rx drug coverage. A1C pending. D/c NPH. Start Lantus titrating as needed, SSI. Will give 10units Novolog now with cbg 400.   3. Acute hepatitis: Unclear etiology. ? Viral vs related to sepsis vs other. Pt is asymptomatic, but recent weight loss is concerning. Will order abdominal ultrasound. Check hepatitis panel, coags, NH3. Consider GI consult pending results. Stop statin  4. Acute renal failure/contrast-induced nephropathy: Cr  2.0<--1.10 on admit 3/20.  Will increase IVF's and monitor trend.  Stop ACEI, recheck u/a, check urine sodium.  5. Chest pain, pleuritic: continue analgesics.  6. Hyperlipidemia: on statin.   7. Prophylaxis: on SQ lovenox for DVT prophylaxis    Patrici Ranks, NP-C [facility] [place]  pgr [phone]   LOS: 1 day    03/12/2012, 12:13 PM

## 2012-03-12 NOTE — Progress Notes (Signed)
Utilization review completed.  

## 2012-03-12 NOTE — Progress Notes (Signed)
CRITICAL VALUE ALERT  Critical value received:  Positive blood cultures x2 - gram positive cocci in pairs  Date of notification:  03/12/2012  Time of notification:  1035   Critical value read back:yes  Nurse who received alert:  Driggers, Allene Pyo   MD notified (1st page):  Dr. Marye Round    Time of first page:  1035  MD notified (2nd page):  Time of second page:  Responding MD:  Dr. Marye Round  Time MD responded:  [phone]

## 2012-03-12 NOTE — Progress Notes (Signed)
   CARE MANAGEMENT NOTE 03/12/2012  Patient:  [name] [name], [name] [name]   Account Number:  [id number]  Date Initiated:  03/12/2012  Documentation initiated by:  Tomi Bamberger  Subjective/Objective Assessment:   dx pna  admit as observation     Action/Plan:   Anticipated DC Date:  03/16/2012   Anticipated DC Plan:  [place]  CM consult      Choice offered to / List presented to:             Status of service:  In process, will continue to follow Medicare Important Message given?   (If response is "NO", the following Medicare IM given date fields will be blank) Date Medicare IM given:   Date Additional Medicare IM given:    Discharge Disposition:    Per UR Regulation:    If discussed at Long Length of Stay Meetings, dates discussed:    Comments:  03/12/12 17:04 Tomi Bamberger RN, BSN [phone] patient lives with children, pta independent, NCM will continue to follow for dc needs.

## 2012-03-12 NOTE — H&P (Signed)
[name] [name] is an 46 y.o. male.   PCP - Dr.Stallings ([facility]). Chief Complaint: Right-sided pleuritic chest pain. HPI: 46 year old male with known history of diabetes mellitus2 has not been feeling well since Sunday that's 4 days ago and had gone to urgent care and was told he had flu and was started on Tamiflu on Sunday. Following which he started developing some nausea and start of a right-sided chest pain on deep breathing. Since it was persistent he went to his PCP yesterday and was referred to the ER at [facility]. At the ER patient had CT angiogram of the chest which showed bilateral pneumonia. Patient has been admitted for further management. Patient at this time denies any nausea vomiting abdominal pain or any dysuria discharges or diarrhea.  Past Medical History  Diagnosis Date  . Flu   . Diabetes mellitus     History reviewed. No pertinent past surgical history.  History reviewed. No pertinent family history. Social History:  reports that he has never smoked. He has never used smokeless tobacco. He reports that he does not drink alcohol or use illicit drugs.  Allergies:  Allergies  Allergen Reactions  . Penicillins Rash    Medications Prior to Admission  Medication Dose Route Frequency Provider Last Rate Last Dose  . 0.9 %  sodium chloride infusion   Intravenous Continuous Rise Patience, MD      . acetaminophen (TYLENOL) tablet 650 mg  650 mg Oral Q6H PRN Rise Patience, MD       Or  . acetaminophen (TYLENOL) suppository 650 mg  650 mg Rectal Q6H PRN Rise Patience, MD      . acetaminophen (TYLENOL) tablet 650 mg  650 mg Oral Once Glendell Docker, NP   650 mg at 03/11/12 1336  . aspirin tablet 81 mg  81 mg Oral Daily Rise Patience, MD      . atorvastatin (LIPITOR) tablet 20 mg  20 mg Oral q1800 Rise Patience, MD      . azithromycin (ZITHROMAX) 500 mg in dextrose 5 % 250 mL IVPB  500 mg Intravenous Once Glendell Docker, NP   500  mg at 03/11/12 1450  . azithromycin (ZITHROMAX) 500 mg in dextrose 5 % 250 mL IVPB  500 mg Intravenous Q24H Rise Patience, MD      . cefTRIAXone (ROCEPHIN) 1 g in dextrose 5 % 50 mL IVPB  1 g Intravenous Once Glendell Docker, NP   1 g at 03/11/12 1421  . cefTRIAXone (ROCEPHIN) 1 g in dextrose 5 % 50 mL IVPB  1 g Intravenous Q24H Rise Patience, MD      . enoxaparin (LOVENOX) injection 40 mg  40 mg Subcutaneous Q24H Rise Patience, MD      . HYDROcodone-acetaminophen (NORCO) 5-325 MG per tablet 1 tablet  1 tablet Oral Once Glendell Docker, NP   1 tablet at 03/11/12 1450  . HYDROcodone-acetaminophen (NORCO) 5-325 MG per tablet 1 tablet  1 tablet Oral Once Glendell Docker, NP   1 tablet at 03/11/12 2152  . insulin aspart (novoLOG) injection 0-9 Units  0-9 Units Subcutaneous TID WC Rise Patience, MD      . insulin NPH (HUMULIN N,NOVOLIN N) injection 35 Units  35 Units Subcutaneous BID Rise Patience, MD      . insulin regular (NOVOLIN R,HUMULIN R) 100 units/mL injection 10 Units  10 Units Intravenous Once Glendell Docker, NP   10 Units at 03/11/12 1419  .  insulin regular (NOVOLIN R,HUMULIN R) 100 units/mL injection 15 Units  15 Units Subcutaneous q morning - 10a Rise Patience, MD      . iohexol (OMNIPAQUE) 350 MG/ML injection 80 mL  80 mL Intravenous Once PRN Medication Radiologist, MD   80 mL at 03/11/12 1715  . lisinopril (PRINIVIL,ZESTRIL) tablet 5 mg  5 mg Oral Daily Rise Patience, MD      . multivitamin capsule 1 capsule  1 capsule Oral Daily Rise Patience, MD      . ondansetron [facility]) tablet 4 mg  4 mg Oral Q6H PRN Rise Patience, MD       Or  . ondansetron [facility]) injection 4 mg  4 mg Intravenous Q6H PRN Rise Patience, MD      . oseltamivir (TAMIFLU) capsule 75 mg  75 mg Oral BID Rise Patience, MD      . oxyCODONE (Oxy IR/ROXICODONE) immediate release tablet 5 mg  5 mg Oral Q4H PRN Rise Patience, MD      . sodium  chloride 0.9 % bolus 1,000 mL  1,000 mL Intravenous Once Glendell Docker, NP   1,000 mL at 03/11/12 1450  . sodium chloride 0.9 % bolus 500 mL  500 mL Intravenous Once Glendell Docker, NP       No current outpatient prescriptions on file as of 03/12/2012.    Results for orders placed during the hospital encounter of 03/11/12 (from the past 48 hour(s))  CBC     Status: Abnormal   Collection Time   03/11/12  1:00 PM      Component Value Range Comment   WBC 10.1  4.0 - 10.5 (K/uL)    RBC 3.50 (*) 4.22 - 5.81 (MIL/uL)    Hemoglobin 10.0 (*) 13.0 - 17.0 (g/dL)    HCT 28.3 (*) 39.0 - 52.0 (%)    MCV 80.9  78.0 - 100.0 (fL)    MCH 28.6  26.0 - 34.0 (pg)    MCHC 35.3  30.0 - 36.0 (g/dL)    RDW 10.9 (*) 11.5 - 15.5 (%)    Platelets 224  150 - 400 (K/uL)   BASIC METABOLIC PANEL     Status: Abnormal   Collection Time   03/11/12  1:00 PM      Component Value Range Comment   Sodium 125 (*) 135 - 145 (mEq/L)    Potassium 4.2  3.5 - 5.1 (mEq/L)    Chloride 87 (*) 96 - 112 (mEq/L)    CO2 27  19 - 32 (mEq/L)    Glucose, Bld 358 (*) 70 - 99 (mg/dL)    BUN 29 (*) 6 - 23 (mg/dL)    Creatinine, Ser 1.10  0.50 - 1.35 (mg/dL)    Calcium 8.3 (*) 8.4 - 10.5 (mg/dL)    GFR calc non Af Amer 79 (*) >90 (mL/min)    GFR calc Af Amer >90  >90 (mL/min)   URINALYSIS, ROUTINE W REFLEX MICROSCOPIC     Status: Abnormal   Collection Time   03/11/12  3:40 PM      Component Value Range Comment   Color, Urine AMBER (*) YELLOW  BIOCHEMICALS MAY BE AFFECTED BY COLOR   APPearance CLOUDY (*) CLEAR     Specific Gravity, Urine 1.027  1.005 - 1.030     pH 5.5  5.0 - 8.0     Glucose, UA >1000 (*) NEGATIVE (mg/dL)    Hgb urine dipstick LARGE (*) NEGATIVE  Bilirubin Urine MODERATE (*) NEGATIVE     Ketones, ur 15 (*) NEGATIVE (mg/dL)    Protein, ur >300 (*) NEGATIVE (mg/dL)    Urobilinogen, UA 4.0 (*) 0.0 - 1.0 (mg/dL)    Nitrite NEGATIVE  NEGATIVE     Leukocytes, UA NEGATIVE  NEGATIVE    URINE MICROSCOPIC-ADD ON      Status: Abnormal   Collection Time   03/11/12  3:40 PM      Component Value Range Comment   Squamous Epithelial / LPF FEW (*) RARE     RBC / HPF 7-10  <3 (RBC/hpf)    Bacteria, UA FEW (*) RARE    GLUCOSE, CAPILLARY     Status: Abnormal   Collection Time   03/11/12  4:05 PM      Component Value Range Comment   Glucose-Capillary 371 (*) 70 - 99 (mg/dL)   GLUCOSE, CAPILLARY     Status: Abnormal   Collection Time   03/11/12  9:54 PM      Component Value Range Comment   Glucose-Capillary 303 (*) 70 - 99 (mg/dL)    Dg Chest 2 View  03/11/2012  *RADIOLOGY REPORT*  Clinical Data: Shortness of breath with right lower chest pain and fever.  CHEST - 2 VIEW  Comparison: None.  Findings: Trachea is midline.  Heart size normal.  There is dense airspace consolidation in the right lower lobe.  Question mild left lower lobe air space disease.  No left pleural fluid.  IMPRESSION:  1.  Dense airspace consolidation in the right lower lobe is most consistent with pneumonia.  Follow-up to clearing is recommended, to exclude a centrally obstructing mass. 2.  Question mild left lower lobe air space disease.  Original Report Authenticated By: Luretha Rued, M.D.   Ct Angio Chest W/cm &/or Wo Cm  03/11/2012  *RADIOLOGY REPORT*  Clinical Data: Fever.  Cough.  Weakness.  Short of breath.  CT ANGIOGRAPHY CHEST  Technique:  Multidetector CT imaging of the chest using the standard protocol during bolus administration of intravenous contrast. Multiplanar reconstructed images including MIPs were obtained and reviewed to evaluate the vascular anatomy.  Contrast: 51mL OMNIPAQUE IOHEXOL 350 MG/ML IV SOLN  Comparison: Chest radiography same day  Findings: Pulmonary arterial opacification is excellent.  There are no pulmonary emboli.  There is consolidative pneumonia throughout the right lower lobe with minor involvement of the right upper lobe.  There is consolidative pneumonia less extensively in the posterior aspect of the  left lower lobe.  There is a small amount of pleural fluid on the right.  There is a tiny amount of pericardial fluid.  There are small mediastinal lymph nodes, likely reactive.  No upper abdominal pathology is seen.  IMPRESSION: No pulmonary emboli.  Consolidative pneumonia throughout the right lower lobe.  Partial involvement of the left lower lobe by consolidative  pneumonia. Minimal involvement of the right upper lobe.  Original Report Authenticated By: Jules Schick, M.D.    Review of Systems  Constitutional: Positive for fever and chills.  HENT: Negative.   Eyes: Negative.   Respiratory: Negative.   Cardiovascular: Positive for chest pain.  Gastrointestinal: Negative.   Genitourinary: Negative.   Musculoskeletal: Negative.   Skin: Negative.   Neurological: Negative.   Endo/Heme/Allergies: Negative.   Psychiatric/Behavioral: Negative.     Blood pressure 144/94, pulse 96, temperature 99.1 F (37.3 C), temperature source Oral, resp. rate 18, height 6' 0.2" (1.834 m), weight 84.2 kg (185 lb 10 oz), SpO2 96.00%.  Physical Exam  Constitutional: He is oriented to person, place, and time. He appears well-developed and well-nourished. No distress.  HENT:  Head: Normocephalic and atraumatic.  Right Ear: External ear normal.  Left Ear: External ear normal.  Mouth/Throat: No oropharyngeal exudate.  Eyes: Conjunctivae are normal. Pupils are equal, round, and reactive to light. Right eye exhibits no discharge. Left eye exhibits no discharge. No scleral icterus.  Neck: Normal range of motion. Neck supple.  Cardiovascular: Normal rate, regular rhythm and normal heart sounds.   Respiratory: Effort normal and breath sounds normal. No respiratory distress. He has no wheezes. He has no rales.  GI: Soft. Bowel sounds are normal. He exhibits no distension. There is no tenderness. There is no rebound.  Musculoskeletal: Normal range of motion. He exhibits no edema and no tenderness.  Neurological: He  is alert and oriented to person, place, and time.       Moves all limbs 5/5.  Skin: Skin is warm and dry. No rash noted. He is not diaphoretic. No erythema.  Psychiatric: His behavior is normal.     Assessment/Plan #1. Community-acquired pneumonia - continue with ceftriaxone Zithromax. And one more day of Tamiflu. If chest pain persists will need repeat chest x-rays to make sure there is no pleural effusion developing. #2. Pleuritic chest pain from #1 reason. #3. Diabetes mellitus2 - continued present medications and sliding scale coverage.  CODE STATUS - full code.  Ahren Pettinger N. 03/12/2012, 3:26 AM

## 2012-03-13 ENCOUNTER — Encounter (HOSPITAL_COMMUNITY): Payer: Self-pay | Admitting: General Surgery

## 2012-03-13 ENCOUNTER — Telehealth: Payer: Self-pay | Admitting: Emergency Medicine

## 2012-03-13 DIAGNOSIS — R634 Abnormal weight loss: Secondary | ICD-10-CM

## 2012-03-13 DIAGNOSIS — N179 Acute kidney failure, unspecified: Secondary | ICD-10-CM | POA: Diagnosis not present

## 2012-03-13 DIAGNOSIS — R945 Abnormal results of liver function studies: Secondary | ICD-10-CM

## 2012-03-13 DIAGNOSIS — R0602 Shortness of breath: Secondary | ICD-10-CM

## 2012-03-13 DIAGNOSIS — R0902 Hypoxemia: Secondary | ICD-10-CM

## 2012-03-13 DIAGNOSIS — R079 Chest pain, unspecified: Secondary | ICD-10-CM

## 2012-03-13 DIAGNOSIS — J189 Pneumonia, unspecified organism: Secondary | ICD-10-CM

## 2012-03-13 DIAGNOSIS — R7989 Other specified abnormal findings of blood chemistry: Secondary | ICD-10-CM | POA: Diagnosis present

## 2012-03-13 DIAGNOSIS — D72828 Other elevated white blood cell count: Secondary | ICD-10-CM

## 2012-03-13 LAB — COMPREHENSIVE METABOLIC PANEL
ALT: 82 U/L — ABNORMAL HIGH (ref 0–53)
AST: 112 U/L — ABNORMAL HIGH (ref 0–37)
Albumin: 1.6 g/dL — ABNORMAL LOW (ref 3.5–5.2)
Alkaline Phosphatase: 508 U/L — ABNORMAL HIGH (ref 39–117)
BUN: 74 mg/dL — ABNORMAL HIGH (ref 6–23)
CO2: 20 mEq/L (ref 19–32)
Calcium: 7.9 mg/dL — ABNORMAL LOW (ref 8.4–10.5)
Chloride: 91 mEq/L — ABNORMAL LOW (ref 96–112)
Creatinine, Ser: 2.99 mg/dL — ABNORMAL HIGH (ref 0.50–1.35)
GFR calc Af Amer: 27 mL/min — ABNORMAL LOW (ref >90)
GFR calc non Af Amer: 24 mL/min — ABNORMAL LOW (ref >90)
Glucose, Bld: 355 mg/dL — ABNORMAL HIGH (ref 70–99)
Potassium: 4 mEq/L (ref 3.5–5.1)
Sodium: 125 mEq/L — ABNORMAL LOW (ref 135–145)
Total Bilirubin: 2.7 mg/dL — ABNORMAL HIGH (ref 0.3–1.2)
Total Protein: 6.3 g/dL (ref 6.0–8.3)

## 2012-03-13 LAB — PROTIME-INR
INR: 1.05 (ref 0.00–1.49)
Prothrombin Time: 13.9 seconds (ref 11.6–15.2)

## 2012-03-13 LAB — ALKALINE PHOSPHATASE, ISOENZYMES
ALP, Heat Stable (Liver): 259 U/L
Alk Phos Bone Fract: 278 U/L
Alk Phos Liver Fract: 48 %
Alk Phos: 537 U/L — ABNORMAL HIGH (ref 39–117)

## 2012-03-13 LAB — HEPATITIS PANEL, ACUTE
HCV Ab: NEGATIVE
Hep A IgM: NEGATIVE
Hep B C IgM: NEGATIVE
Hepatitis B Surface Ag: NEGATIVE

## 2012-03-13 LAB — CBC
HCT: 25 % — ABNORMAL LOW (ref 39.0–52.0)
Hemoglobin: 8.4 g/dL — ABNORMAL LOW (ref 13.0–17.0)
MCH: 28.5 pg (ref 26.0–34.0)
MCHC: 33.6 g/dL (ref 30.0–36.0)
MCV: 84.7 fL (ref 78.0–100.0)
Platelets: 243 10*3/uL (ref 150–400)
RBC: 2.95 MIL/uL — ABNORMAL LOW (ref 4.22–5.81)
RDW: 12.4 % (ref 11.5–15.5)
WBC: 21.8 10*3/uL — ABNORMAL HIGH (ref 4.0–10.5)

## 2012-03-13 LAB — GAMMA GT: GGT: 99 U/L — ABNORMAL HIGH (ref 7–51)

## 2012-03-13 LAB — GLUCOSE, CAPILLARY
Glucose-Capillary: 308 mg/dL — ABNORMAL HIGH (ref 70–99)
Glucose-Capillary: 360 mg/dL — ABNORMAL HIGH (ref 70–99)
Glucose-Capillary: 335 mg/dL — ABNORMAL HIGH (ref 70–99)
Glucose-Capillary: 258 mg/dL — ABNORMAL HIGH (ref 70–99)

## 2012-03-13 MED ORDER — GUAIFENESIN 100 MG/5ML PO SOLN
5.0000 mL | ORAL | Status: DC | PRN
  Administered 2012-03-13: 100 mg via ORAL
  Filled 2012-03-13: qty 5

## 2012-03-13 MED ORDER — INSULIN GLARGINE 100 UNIT/ML ~~LOC~~ SOLN
25.0000 [IU] | Freq: Every day | SUBCUTANEOUS | Status: DC
  Administered 2012-03-13: 25 [IU] via SUBCUTANEOUS

## 2012-03-13 MED ORDER — HYDRALAZINE HCL 20 MG/ML IJ SOLN
5.0000 mg | Freq: Four times a day (QID) | INTRAMUSCULAR | Status: DC | PRN
  Administered 2012-03-13: 5 mg via INTRAVENOUS
  Filled 2012-03-13: qty 0.25

## 2012-03-13 MED ORDER — INSULIN GLARGINE 100 UNIT/ML ~~LOC~~ SOLN: 15.0000 [IU] | Freq: Every day | SUBCUTANEOUS | Status: DC

## 2012-03-13 MED ORDER — DEXTROSE 5 % IV SOLN
2.0000 g | INTRAVENOUS | Status: DC
  Administered 2012-03-13 – 2012-03-16 (×4): 2 g via INTRAVENOUS
  Filled 2012-03-13 (×4): qty 2

## 2012-03-13 MED ORDER — LIVING WELL WITH DIABETES BOOK
Freq: Once | Status: AC
  Administered 2012-03-13: 18:00:00
  Filled 2012-03-13: qty 1

## 2012-03-13 NOTE — Progress Notes (Signed)
I have seen and examined Mr. Piehl  I agree with PE/Ap as per Karolee Stamps PA I appreciate input from consultants  Hashir Deleeuw

## 2012-03-13 NOTE — Telephone Encounter (Signed)
Order has been placed.

## 2012-03-13 NOTE — Consult Note (Signed)
Infectious disease initial consult note  Late entry, patient seen 3/22  Chief complaint: Pneumonia  Reason of consultation: Antibiotics choice and duration in patient with pneumonia and bacteremia  HPI: [name] is a 46 yo patient with a hx of diabetes since 2000. He has been treated with insulin and has not been complaint to his regimen recently. Also his work schedule has been intensified over the past six months during which he has been working overnight. He presented to urgent care last Sunday with the flu for which he was treated with tamiflu. He has felt increasing right-sided chest pain since, with fever, cough and yellowish sputum production. He denied nausea, vomiting, diarrhea or abdominal pain, but admitted to having lost 20 pounds over the past 6 months.  His PCP referred him to [facility] where a CT angiogram ruled out pulmonary thromboembolism but revealed right lower and middle and smaller left lung consolidations, consistent with pneumonia. He was admitted in [place] for inpatient treatment and IV azithromycin and ceftriaxone were started. He also received two doses of vancomycin after which his urine production decreased and vanc was discontinued.  The patient has progressed to develop multiorgan failure with increasing creatinine, LFTs and alkaline phophatase, although it appears that his blood pressure did not drop significantly and he has not been septic. His blood cultures grow streptococcus.   Review of Systems  Constitutional: Negative for fever and chills HENT: Negative.  Eyes: Negative.  Respiratory: Negative for shortness of breath, positive for cough, yellow sputum production, pleuritic type chest pain Cardiovascular: negative for angina Gastrointestinal: Negative for abdominal tenderness, nausea , vomiting, diarrhea Genitourinary: Negative for dysuria Musculoskeletal: Negative.  Skin: Negative.  Neurological: Negative.  Endo/Heme/Allergies: Negative.    Psychiatric/Behavioral: Negative. Positive for anxiety  No current facility-administered medications on file prior to encounter.   No current outpatient prescriptions on file prior to encounter.     Past Medical History  Diagnosis Date  . Flu   . Diabetes mellitus    History reviewed. No pertinent past surgical history.  History reviewed. No pertinent family history. History   Social History  . Marital Status: Married    Spouse Name: N/A    Number of Children: N/A  . Years of Education: N/A   Occupational History  . Not on file.   Social History Main Topics  . Smoking status: Never Smoker   . Smokeless tobacco: Never Used  . Alcohol Use: No  . Drug Use: No  . Sexually Active:    Other Topics Concern  . Not on file   Social History Narrative  . No narrative on file   Allergies  Allergen Reactions  . Penicillins Rash    Tolerating Ceftriaxone (03/12/12)   Objective: Vital signs in last 24 hours: Temp:  [98.1 F (36.7 C)-98.5 F (36.9 C)] 98.3 F (36.8 C) (03/22 0523) Pulse Rate:  [94-97] 94  (03/22 0523) Resp:  [20] 20  (03/22 0523) BP: (148-163)/(85-102) 152/90 mmHg (03/22 0642) SpO2:  [94 %-97 %] 94 % (03/22 0523)  Intake/Output from previous day: 03/21 0701 - 03/22 0700 In: 118 [P.O.:118] Out: 200 [Urine:200] Intake/Output this shift: Total I/O In: 3403.8 [I.V.:3403.8] Out: -   Physical exam:   Constitutional: alert, cooperative, in no acute pain HEENT: EOMI, PEERL, oropharynx without erythema or exudates EYES: Icteric CV: RRR, normal s1 s2, no mrg LUNGS: diffuse crackles on the right more than left, decreased breath sounds on the bases ABD: soft, nontender, nondistened, normal BS, negative Murphy's sign  SKIN: multiple scars on lower extremities due to trauma, trace edema on the ankles bilaterally NEURO: normal strength and sensation   Lab Results   [facility] 03/13/12 0642 03/12/12 0940  WBC 21.8* 14.8*  HGB 8.4* 8.9*  HCT 25.0* 26.0*   NA 125* 127*  K 4.0 3.9  CL 91* 89*  CO2 20 21  BUN 74* 50*  CREATININE 2.99* 2.00*  GLU -- --   Liver Panel  Basename 03/13/12 0642 03/12/12 0940  PROT 6.3 6.4  ALBUMIN 1.6* 1.6*  AST 112* 60*  ALT 82* 78*  ALKPHOS 508* 390*  BILITOT 2.7* 3.2*  BILIDIR -- --  IBILI -- --   Sedimentation Rate No results found for this basename: ESRSEDRATE in the last 72 hours C-Reactive Protein No results found for this basename: CRP:2 in the last 72 hours  Microbiology: Recent Results (from the past 240 hour(s))  CULTURE, BLOOD (ROUTINE X 2)     Status: Normal (Preliminary result)   Collection Time   03/11/12  1:00 PM      Component Value Range Status Comment   Specimen Description BLOOD RIGHT ANTECUBITAL   Final    Special Requests NONE BOTTLES DRAWN AEROBIC AND ANAEROBIC [facility]   Final    Culture  Setup Time [id number]   Final    Culture     Final    Value: STREPTOCOCCUS SPECIES     Note: Gram Stain Report Called to,Read Back By and Verified With: COURTNEY DRIGGERS @ [id number] 03/12/12 WICKN   Report Status PENDING   Incomplete   CULTURE, BLOOD (ROUTINE X 2)     Status: Normal (Preliminary result)   Collection Time   03/11/12  2:10 PM      Component Value Range Status Comment   Specimen Description BLOOD RIGHT HAND   Final    Special Requests NONE BOTTLES DRAWN AEROBIC AND ANAEROBIC [facility]   Final    Culture  Setup Time [id number]   Final    Culture     Final    Value: GRAM POSITIVE COCCI IN PAIRS     STREPTOCOCCUS SPECIES     Note: Gram Stain Report Called to,Read Back By and Verified WithHuel Cote @ 1035 03/12/12 WICKN   Report Status PENDING   Incomplete     Studies/Results: Dg Chest 2 View  03/11/2012  *RADIOLOGY REPORT*  Clinical Data: Shortness of breath with right lower chest pain and fever.  CHEST - 2 VIEW  Comparison: None.  Findings: Trachea is midline.  Heart size normal.  There is dense airspace consolidation in the right lower lobe.  Question mild left  lower lobe air space disease.  No left pleural fluid.  IMPRESSION:  1.  Dense airspace consolidation in the right lower lobe is most consistent with pneumonia.  Follow-up to clearing is recommended, to exclude a centrally obstructing mass. 2.  Question mild left lower lobe air space disease.  Original Report Authenticated By: Luretha Rued, M.D.   Ct Angio Chest W/cm &/or Wo Cm  03/11/2012  *RADIOLOGY REPORT*  Clinical Data: Fever.  Cough.  Weakness.  Short of breath.  CT ANGIOGRAPHY CHEST  Technique:  Multidetector CT imaging of the chest using the standard protocol during bolus administration of intravenous contrast. Multiplanar reconstructed images including MIPs were obtained and reviewed to evaluate the vascular anatomy.  Contrast: 62mL OMNIPAQUE IOHEXOL 350 MG/ML IV SOLN  Comparison: Chest radiography same day  Findings: Pulmonary arterial opacification is excellent.  There are  no pulmonary emboli.  There is consolidative pneumonia throughout the right lower lobe with minor involvement of the right upper lobe.  There is consolidative pneumonia less extensively in the posterior aspect of the left lower lobe.  There is a small amount of pleural fluid on the right.  There is a tiny amount of pericardial fluid.  There are small mediastinal lymph nodes, likely reactive.  No upper abdominal pathology is seen.  IMPRESSION: No pulmonary emboli.  Consolidative pneumonia throughout the right lower lobe.  Partial involvement of the left lower lobe by consolidative  pneumonia. Minimal involvement of the right upper lobe.  Original Report Authenticated By: Jules Schick, M.D.   US Abdomen Complete  03/12/2012  *RADIOLOGY REPORT*  Clinical Data:  Right upper quadrant abdominal pain, elevated liver function tests  ABDOMINAL ULTRASOUND COMPLETE  Comparison:  Chest CT 03/11/2012 with incomplete visualization of the liver  Findings:  Gallbladder:  Minimal dependent sludge is noted within the gallbladder.  The  gallbladder is not distended, with gallbladder wall thickness measuring borderline thickened at 4 mm.  No shadowing calculus or sonographic Murphy's sign noted.  Common Bile Duct:  Within normal limits in caliber.  Liver: No focal mass lesion identified.  Within normal limits in parenchymal echogenicity.  IVC:  Appears normal.  Pancreas:  No abnormality identified.  Spleen:  Within normal limits in size and echotexture.  Right kidney:  Normal in size and parenchymal echogenicity.  No evidence of mass or hydronephrosis.  Left kidney:  Normal in size and parenchymal echogenicity.  No evidence of mass or hydronephrosis.  Abdominal Aorta:  Small amount of ascites is noted.  Trace left pleural effusion.  IMPRESSION: Minimal sludge within the gallbladder with borderline wall thickening.  This could indicate cholecystitis in the appropriate clinical context.  Trace ascites.  Original Report Authenticated By: Arline Asp, M.D.    Medications:   Current facility-administered medications:0.9 %  sodium chloride infusion, , Intravenous, Continuous, Melton Alar, PA, Last Rate: 125 mL/hr at 03/13/12 1100;  acetaminophen (TYLENOL) suppository 650 mg, 650 mg, Rectal, Q6H PRN, Rise Patience, MD;  acetaminophen (TYLENOL) tablet 650 mg, 650 mg, Oral, Q6H PRN, Rise Patience, MD;  aspirin chewable tablet 81 mg, 81 mg, Oral, Daily, Rise Patience, MD, 81 mg at 03/13/12 1129 cefTRIAXone (ROCEPHIN) 2 g in dextrose 5 % 50 mL IVPB, 2 g, Intravenous, Q24H, Marianne L York, PA;  enoxaparin (LOVENOX) injection 40 mg, 40 mg, Subcutaneous, Q24H, Rise Patience, MD, 40 mg at 03/12/12 1400;  guaiFENesin (ROBITUSSIN) 100 MG/5ML solution 100 mg, 5 mL, Oral, Q4H PRN, Dianne Dun, NP, 100 mg at 03/13/12 0329 hydrALAZINE (APRESOLINE) injection 5 mg, 5 mg, Intravenous, Q6H PRN, Dianne Dun, NP, 5 mg at 03/13/12 0550;  insulin aspart (novoLOG) injection 0-15 Units, 0-15 Units, Subcutaneous,  TID WC, Patrici Ranks, NP, 11 Units at 03/13/12 0848;  insulin aspart (novoLOG) injection 0-5 Units, 0-5 Units, Subcutaneous, QHS, Patrici Ranks, NP, 4 Units at 03/12/12 2219 insulin glargine (LANTUS) injection 15 Units, 15 Units, Subcutaneous, QHS, Melton Alar, PA;  mulitivitamin with minerals tablet 1 tablet, 1 tablet, Oral, Daily, Rise Patience, MD, 1 tablet at 03/13/12 1129;  ondansetron (ZOFRAN) injection 4 mg, 4 mg, Intravenous, Q6H PRN, Rise Patience, MD, 4 mg at 03/13/12 0540;  ondansetron (ZOFRAN) tablet 4 mg, 4 mg, Oral, Q6H PRN, Rise Patience, MD oxyCODONE (Oxy IR/ROXICODONE) immediate release tablet 5 mg, 5 mg, Oral, Q4H PRN, Doreatha Lew  Hal Hope, MD;  DISCONTD: atorvastatin (LIPITOR) tablet 20 mg, 20 mg, Oral, q1800, Rise Patience, MD;  DISCONTD: azithromycin (ZITHROMAX) 500 mg in dextrose 5 % 250 mL IVPB, 500 mg, Intravenous, Q24H, Rise Patience, MD, 500 mg at 03/12/12 1612 DISCONTD: cefTRIAXone (ROCEPHIN) 1 g in dextrose 5 % 50 mL IVPB, 1 g, Intravenous, Q24H, Rise Patience, MD, 1 g at 03/12/12 1400;  DISCONTD: insulin glargine (LANTUS) injection 10 Units, 10 Units, Subcutaneous, QHS, Patrici Ranks, NP, 10 Units at 03/12/12 2218;  DISCONTD: insulin glargine (LANTUS) injection 5 Units, 5 Units, Subcutaneous, QHS, Patrici Ranks, NP DISCONTD: lisinopril (PRINIVIL,ZESTRIL) tablet 5 mg, 5 mg, Oral, Daily, Rise Patience, MD, 5 mg at 03/12/12 1042;  DISCONTD: oseltamivir (TAMIFLU) capsule 75 mg, 75 mg, Oral, BID, Rise Patience, MD, 75 mg at 03/12/12 1042;  DISCONTD: vancomycin (VANCOCIN) 750 mg in sodium chloride 0.9 % 150 mL IVPB, 750 mg, Intravenous, Q12H, Sorin C Laza, MD, 750 mg at 03/13/12 [id number]  Assessment/Plan:  Assessment: This patient has evidence of bilateral lung consolidations on CT strongly suggestive of pneumonia. His Blood cultures grow streptococcus spp although the patient does not look septic at this time. He is  afebrile and has had high blood pressures. The multiorgan failure is of unknown etiology so far but could be due transient organ dysfunction due to occult hypotensive episodes.   Plan: 1. Streptococcal pneumonia: Will continue ceftriaxone after first negative blood cx. D/c azithromycin.  Will follow up on patient's cultures and clinical picture.   2. Elevated liver enzymes. Could represent hepatitis. Hepatitis panel pending.    LOS: 2 days    Scharlene Gloss, MD

## 2012-03-13 NOTE — Progress Notes (Signed)
   CARE MANAGEMENT NOTE 03/13/2012  Patient:  [name] [name], [name] [name]   Account Number:  [id number]  Date Initiated:  03/12/2012  Documentation initiated by:  Tomi Bamberger  Subjective/Objective Assessment:   dx pna  admit as observation     Action/Plan:   Anticipated DC Date:  03/16/2012   Anticipated DC Plan:  [place]  CM consult      Choice offered to / List presented to:             Status of service:  In process, will continue to follow Medicare Important Message given?   (If response is "NO", the following Medicare IM given date fields will be blank) Date Medicare IM given:   Date Additional Medicare IM given:    Discharge Disposition:    Per UR Regulation:    If discussed at Long Length of Stay Meetings, dates discussed:    Comments:  03/13/12 Sl[place] RN, BSN [phone] patient has surgery consult for poss chole, pulmonary following (coughing up blood), kidneys and liver not doing so good.  NCM will continue to follow for dc needs.  03/12/12 17:04 Tomi Bamberger RN, BSN [phone] patient lives with children, pta independent, NCM will continue to follow for dc needs.

## 2012-03-13 NOTE — Consult Note (Signed)
[name] [name] 1966-01-22  [id number].   Primary Care MD: [name] Requesting MD: Dr. Marye Round Chief Complaint/Reason for Consult: rule out cholecystitis HPI: This is a 46 yo male who had a history of uncontrolled DM who began having coughing and fevers on 03-08-12.  He saw his PCP who sent him to [facility] for evaluation.  He was found to have a large RLL PNA and was transferred to [facility] for admission.  The patient has been complaining of pleuritic type chest pain on the right side.  A CTA was obtained which revealed this large PNA but no PE.  He has had an elevation of WBC over the last several days with worsening renal failure, and elevated LFTs.  An abdominal u/s was obtained that questioned some mild gb wall thickening, but no stones.  Cholecystitis could not be ruled out.  Therefore, we have been consulted.    Review of Systems: Please see HPI, otherwise all other systems are negative, except now he admits to some hemoptysis.  History reviewed. No pertinent family history.  Past Medical History  Diagnosis Date  . Flu   . Diabetes mellitus     History reviewed. No pertinent past surgical history.  Social History:  reports that he has never smoked. He has never used smokeless tobacco. He reports that he occasionally drinks a wine or a beer.  Works as a Furniture conservator/restorer.  Has one daughter.  Allergies:  Allergies  Allergen Reactions  . Penicillins Rash    Tolerating Ceftriaxone (03/12/12)    Medications Prior to Admission  Medication Dose Route Frequency Provider Last Rate Last Dose  . 0.9 %  sodium chloride infusion   Intravenous Continuous Melton Alar, PA 125 mL/hr at 03/13/12 1100    . acetaminophen (TYLENOL) tablet 650 mg  650 mg Oral Q6H PRN Rise Patience, MD       Or  . acetaminophen (TYLENOL) suppository 650 mg  650 mg Rectal Q6H PRN Rise Patience, MD      . acetaminophen (TYLENOL) tablet 650 mg  650 mg Oral Once Glendell Docker, NP   650 mg at  03/11/12 1336  . aspirin chewable tablet 81 mg  81 mg Oral Daily Rise Patience, MD   81 mg at 03/13/12 1129  . azithromycin (ZITHROMAX) 500 mg in dextrose 5 % 250 mL IVPB  500 mg Intravenous Once Glendell Docker, NP   500 mg at 03/11/12 1450  . cefTRIAXone (ROCEPHIN) 1 g in dextrose 5 % 50 mL IVPB  1 g Intravenous Once Glendell Docker, NP   1 g at 03/11/12 1421  . cefTRIAXone (ROCEPHIN) 2 g in dextrose 5 % 50 mL IVPB  2 g Intravenous Q24H Melton Alar, PA      . enoxaparin (LOVENOX) injection 40 mg  40 mg Subcutaneous Q24H Rise Patience, MD   40 mg at 03/12/12 1400  . guaiFENesin (ROBITUSSIN) 100 MG/5ML solution 100 mg  5 mL Oral Q4H PRN Dianne Dun, NP   100 mg at 03/13/12 0329  . hydrALAZINE (APRESOLINE) injection 5 mg  5 mg Intravenous Q6H PRN Dianne Dun, NP   5 mg at 03/13/12 0550  . HYDROcodone-acetaminophen (NORCO) 5-325 MG per tablet 1 tablet  1 tablet Oral Once Glendell Docker, NP   1 tablet at 03/11/12 1450  . HYDROcodone-acetaminophen (NORCO) 5-325 MG per tablet 1 tablet  1 tablet Oral Once Glendell Docker, NP   1 tablet at 03/11/12 2152  .  insulin aspart (novoLOG) injection 0-15 Units  0-15 Units Subcutaneous TID WC Patrici Ranks, NP   11 Units at 03/13/12 0848  . insulin aspart (novoLOG) injection 0-5 Units  0-5 Units Subcutaneous QHS Patrici Ranks, NP   4 Units at 03/12/12 2219  . insulin aspart (novoLOG) injection 10 Units  10 Units Subcutaneous Once Patrici Ranks, NP   10 Units at 03/12/12 [phone]  . insulin glargine (LANTUS) injection 15 Units  15 Units Subcutaneous QHS [name] Rumpf York, PA      . insulin regular (NOVOLIN R,HUMULIN R) 100 units/mL injection 10 Units  10 Units Intravenous Once Glendell Docker, NP   10 Units at 03/11/12 1419  . iohexol (OMNIPAQUE) 350 MG/ML injection 80 mL  80 mL Intravenous Once PRN Medication Radiologist, MD   80 mL at 03/11/12 1715  . mulitivitamin with minerals tablet 1 tablet  1 tablet Oral Daily  Rise Patience, MD   1 tablet at 03/13/12 1129  . ondansetron (ZOFRAN) tablet 4 mg  4 mg Oral Q6H PRN Rise Patience, MD       Or  . ondansetron [facility]) injection 4 mg  4 mg Intravenous Q6H PRN Rise Patience, MD   4 mg at 03/13/12 0540  . oxyCODONE (Oxy IR/ROXICODONE) immediate release tablet 5 mg  5 mg Oral Q4H PRN Rise Patience, MD      . sodium chloride 0.9 % bolus 1,000 mL  1,000 mL Intravenous Once Glendell Docker, NP   1,000 mL at 03/11/12 1450  . sodium chloride 0.9 % bolus 500 mL  500 mL Intravenous Once Glendell Docker, NP      . DISCONTD: atorvastatin (LIPITOR) tablet 20 mg  20 mg Oral q1800 Rise Patience, MD      . DISCONTD: azithromycin (ZITHROMAX) 500 mg in dextrose 5 % 250 mL IVPB  500 mg Intravenous Q24H Rise Patience, MD   500 mg at 03/12/12 1612  . DISCONTD: cefTRIAXone (ROCEPHIN) 1 g in dextrose 5 % 50 mL IVPB  1 g Intravenous Q24H Rise Patience, MD   1 g at 03/12/12 1400  . DISCONTD: insulin aspart (novoLOG) injection 0-9 Units  0-9 Units Subcutaneous TID WC Rise Patience, MD      . DISCONTD: insulin aspart (novoLOG) injection 15 Units  15 Units Subcutaneous Q breakfast Bynum Bellows, MD      . DISCONTD: insulin glargine (LANTUS) injection 10 Units  10 Units Subcutaneous QHS Patrici Ranks, NP   10 Units at 03/12/12 2218  . DISCONTD: insulin glargine (LANTUS) injection 5 Units  5 Units Subcutaneous QHS Patrici Ranks, NP      . DISCONTD: insulin NPH (HUMULIN N,NOVOLIN N) injection 35 Units  35 Units Subcutaneous BID Rise Patience, MD      . DISCONTD: insulin regular (NOVOLIN R,HUMULIN R) 100 units/mL injection 10 Units  10 Units Intravenous Once Patrici Ranks, NP      . DISCONTD: insulin regular (NOVOLIN R,HUMULIN R) 100 units/mL injection 15 Units  15 Units Subcutaneous q morning - 10a Rise Patience, MD      . DISCONTD: lisinopril (PRINIVIL,ZESTRIL) tablet 5 mg  5 mg Oral Daily Rise Patience, MD   5 mg  at 03/12/12 1042  . DISCONTD: oseltamivir (TAMIFLU) capsule 75 mg  75 mg Oral BID Rise Patience, MD   75 mg at 03/12/12 1042  . DISCONTD: vancomycin (VANCOCIN) 750 mg in sodium chloride 0.9 % 150 mL IVPB  750 mg  Intravenous Q12H Sorin June Leap, MD   750 mg at 03/13/12 [id number]   No current outpatient prescriptions on file as of 03/13/2012.    Blood pressure 152/90, pulse 94, temperature 98.3 F (36.8 C), temperature source Oral, resp. rate 20, height 6' 0.2" (1.834 m), weight 185 lb 10 oz (84.2 kg), SpO2 94.00%. Physical Exam:   General: pleasant, WD, WN male who is sitting up in his chair in NAD HEENT: head is normocephalic, atraumatic.  Sclera are noninjected.  PERRL.  Ears and nose without any masses or lesions.  Mouth is pink and moist Heart: regular, rate, and rhythm.  Normal s1,s2. No obvious murmurs, gallops, or rubs noted.  Palpable radial and pedal pulses bilaterally Lungs: CTAB, no wheezes, rhonchi, or rales noted, however he has significantly decreased breath sounds on the right and left side much worse on the right side.  Respiratory effort nonlabored Abd: soft, NT, ND, +BS, no masses, hernias, or organomegaly.  No Murphy's sign is noted. MS: all 4 extremities are symmetrical with no cyanosis, clubbing, or edema. Skin: warm and dry with no masses, lesions, or rashes Psych: A&Ox3 with an appropriate affect.    Results for orders placed during the hospital encounter of 03/11/12 (from the past 48 hour(s))  CBC     Status: Abnormal   Collection Time   03/11/12  1:00 PM      Component Value Range Comment   WBC 10.1  4.0 - 10.5 (K/uL)    RBC 3.50 (*) 4.22 - 5.81 (MIL/uL)    Hemoglobin 10.0 (*) 13.0 - 17.0 (g/dL)    HCT 28.3 (*) 39.0 - 52.0 (%)    MCV 80.9  78.0 - 100.0 (fL)    MCH 28.6  26.0 - 34.0 (pg)    MCHC 35.3  30.0 - 36.0 (g/dL)    RDW 10.9 (*) 11.5 - 15.5 (%)    Platelets 224  150 - 400 (K/uL)   BASIC METABOLIC PANEL     Status: Abnormal   Collection Time   03/11/12   1:00 PM      Component Value Range Comment   Sodium 125 (*) 135 - 145 (mEq/L)    Potassium 4.2  3.5 - 5.1 (mEq/L)    Chloride 87 (*) 96 - 112 (mEq/L)    CO2 27  19 - 32 (mEq/L)    Glucose, Bld 358 (*) 70 - 99 (mg/dL)    BUN 29 (*) 6 - 23 (mg/dL)    Creatinine, Ser 1.10  0.50 - 1.35 (mg/dL)    Calcium 8.3 (*) 8.4 - 10.5 (mg/dL)    GFR calc non Af Amer 79 (*) >90 (mL/min)    GFR calc Af Amer >90  >90 (mL/min)   CULTURE, BLOOD (ROUTINE X 2)     Status: Normal (Preliminary result)   Collection Time   03/11/12  1:00 PM      Component Value Range Comment   Specimen Description BLOOD RIGHT ANTECUBITAL      Special Requests NONE BOTTLES DRAWN AEROBIC AND ANAEROBIC [facility] EACH      Culture  Setup Time [id number]      Culture        Value: STREPTOCOCCUS SPECIES     Note: Gram Stain Report Called to,Read Back By and Verified With: COURTNEY DRIGGERS @ 1035 03/12/12 WICKN   Report Status PENDING     CULTURE, BLOOD (ROUTINE X 2)     Status: Normal (Preliminary result)   Collection Time   03/11/12  2:10 PM      Component Value Range Comment   Specimen Description BLOOD RIGHT HAND      Special Requests NONE BOTTLES DRAWN AEROBIC AND ANAEROBIC 5CC EACH      Culture  Setup Time [id number]      Culture        Value: GRAM POSITIVE COCCI IN PAIRS     STREPTOCOCCUS SPECIES     Note: Gram Stain Report Called to,Read Back By and Verified With: COURTNEY DRIGGERS @ [id number] 03/12/12 WICKN   Report Status PENDING     URINALYSIS, ROUTINE W REFLEX MICROSCOPIC     Status: Abnormal   Collection Time   03/11/12  3:40 PM      Component Value Range Comment   Color, Urine AMBER (*) YELLOW  BIOCHEMICALS MAY BE AFFECTED BY COLOR   APPearance CLOUDY (*) CLEAR     Specific Gravity, Urine 1.027  1.005 - 1.030     pH 5.5  5.0 - 8.0     Glucose, UA >1000 (*) NEGATIVE (mg/dL)    Hgb urine dipstick LARGE (*) NEGATIVE     Bilirubin Urine MODERATE (*) NEGATIVE     Ketones, ur 15 (*) NEGATIVE (mg/dL)    Protein, ur >300 (*)  NEGATIVE (mg/dL)    Urobilinogen, UA 4.0 (*) 0.0 - 1.0 (mg/dL)    Nitrite NEGATIVE  NEGATIVE     Leukocytes, UA NEGATIVE  NEGATIVE    URINE MICROSCOPIC-ADD ON     Status: Abnormal   Collection Time   03/11/12  3:40 PM      Component Value Range Comment   Squamous Epithelial / LPF FEW (*) RARE     RBC / HPF 7-10  <3 (RBC/hpf)    Bacteria, UA FEW (*) RARE    GLUCOSE, CAPILLARY     Status: Abnormal   Collection Time   03/11/12  4:05 PM      Component Value Range Comment   Glucose-Capillary 371 (*) 70 - 99 (mg/dL)   GLUCOSE, CAPILLARY     Status: Abnormal   Collection Time   03/11/12  9:54 PM      Component Value Range Comment   Glucose-Capillary 303 (*) 70 - 99 (mg/dL)   GLUCOSE, CAPILLARY     Status: Abnormal   Collection Time   03/12/12  7:42 AM      Component Value Range Comment   Glucose-Capillary 410 (*) 70 - 99 (mg/dL)   COMPREHENSIVE METABOLIC PANEL     Status: Abnormal   Collection Time   03/12/12  9:40 AM      Component Value Range Comment   Sodium 127 (*) 135 - 145 (mEq/L)    Potassium 3.9  3.5 - 5.1 (mEq/L)    Chloride 89 (*) 96 - 112 (mEq/L)    CO2 21  19 - 32 (mEq/L)    Glucose, Bld 371 (*) 70 - 99 (mg/dL)    BUN 50 (*) 6 - 23 (mg/dL)    Creatinine, Ser 2.00 (*) 0.50 - 1.35 (mg/dL)    Calcium 8.1 (*) 8.4 - 10.5 (mg/dL)    Total Protein 6.4  6.0 - 8.3 (g/dL)    Albumin 1.6 (*) 3.5 - 5.2 (g/dL)    AST 60 (*) 0 - 37 (U/L)    ALT 78 (*) 0 - 53 (U/L)    Alkaline Phosphatase 390 (*) 39 - 117 (U/L)    Total Bilirubin 3.2 (*) 0.3 - 1.2 (mg/dL)    GFR calc non Af  Amer 39 (*) >90 (mL/min)    GFR calc Af Amer 45 (*) >90 (mL/min)   CBC     Status: Abnormal   Collection Time   03/12/12  9:40 AM      Component Value Range Comment   WBC 14.8 (*) 4.0 - 10.5 (K/uL)    RBC 3.12 (*) 4.22 - 5.81 (MIL/uL)    Hemoglobin 8.9 (*) 13.0 - 17.0 (g/dL)    HCT 26.0 (*) 39.0 - 52.0 (%)    MCV 83.3  78.0 - 100.0 (fL)    MCH 28.5  26.0 - 34.0 (pg)    MCHC 34.2  30.0 - 36.0 (g/dL)    RDW  12.1  11.5 - 15.5 (%)    Platelets 233  150 - 400 (K/uL)   HEMOGLOBIN A1C     Status: Abnormal   Collection Time   03/12/12  9:40 AM      Component Value Range Comment   Hemoglobin A1C 15.7 (*) <5.7 (%)    Mean Plasma Glucose 404 (*) <117 (mg/dL)   GLUCOSE, CAPILLARY     Status: Abnormal   Collection Time   03/12/12 11:05 AM      Component Value Range Comment   Glucose-Capillary 357 (*) 70 - 99 (mg/dL)   AMMONIA     Status: Normal   Collection Time   03/12/12  1:22 PM      Component Value Range Comment   Ammonia 22  11 - 60 (umol/L)   PROTIME-INR     Status: Normal   Collection Time   03/12/12  1:23 PM      Component Value Range Comment   Prothrombin Time 14.6  11.6 - 15.2 (seconds)    INR 1.12  0.00 - 1.49    HIV ANTIBODY (ROUTINE TESTING)     Status: Normal   Collection Time   03/12/12  1:23 PM      Component Value Range Comment   HIV NON REACTIVE  NON REACTIVE    URINALYSIS, ROUTINE W REFLEX MICROSCOPIC     Status: Abnormal   Collection Time   03/12/12  1:41 PM      Component Value Range Comment   Color, Urine AMBER (*) YELLOW  BIOCHEMICALS MAY BE AFFECTED BY COLOR   APPearance CLOUDY (*) CLEAR     Specific Gravity, Urine 1.039 (*) 1.005 - 1.030     pH 5.0  5.0 - 8.0     Glucose, UA >1000 (*) NEGATIVE (mg/dL)    Hgb urine dipstick MODERATE (*) NEGATIVE     Bilirubin Urine MODERATE (*) NEGATIVE     Ketones, ur NEGATIVE  NEGATIVE (mg/dL)    Protein, ur >300 (*) NEGATIVE (mg/dL)    Urobilinogen, UA 2.0 (*) 0.0 - 1.0 (mg/dL)    Nitrite NEGATIVE  NEGATIVE     Leukocytes, UA TRACE (*) NEGATIVE    SODIUM, URINE, RANDOM     Status: Normal   Collection Time   03/12/12  1:41 PM      Component Value Range Comment   Sodium, Ur <10   REPEATED TO VERIFY  URINE MICROSCOPIC-ADD ON     Status: Abnormal   Collection Time   03/12/12  1:41 PM      Component Value Range Comment   Squamous Epithelial / LPF FEW (*) RARE     WBC, UA 0-2  <3 (WBC/hpf)    RBC / HPF 0-2  <3 (RBC/hpf)     Bacteria, UA FEW (*) RARE  Casts GRANULAR CAST (*) NEGATIVE     Urine-Other RARE YEAST     GLUCOSE, CAPILLARY     Status: Abnormal   Collection Time   03/12/12  5:32 PM      Component Value Range Comment   Glucose-Capillary 344 (*) 70 - 99 (mg/dL)   GLUCOSE, CAPILLARY     Status: Abnormal   Collection Time   03/12/12  9:31 PM      Component Value Range Comment   Glucose-Capillary 333 (*) 70 - 99 (mg/dL)   CBC     Status: Abnormal   Collection Time   03/13/12  6:42 AM      Component Value Range Comment   WBC 21.8 (*) 4.0 - 10.5 (K/uL)    RBC 2.95 (*) 4.22 - 5.81 (MIL/uL)    Hemoglobin 8.4 (*) 13.0 - 17.0 (g/dL)    HCT 25.0 (*) 39.0 - 52.0 (%)    MCV 84.7  78.0 - 100.0 (fL)    MCH 28.5  26.0 - 34.0 (pg)    MCHC 33.6  30.0 - 36.0 (g/dL)    RDW 12.4  11.5 - 15.5 (%)    Platelets 243  150 - 400 (K/uL)   COMPREHENSIVE METABOLIC PANEL     Status: Abnormal   Collection Time   03/13/12  6:42 AM      Component Value Range Comment   Sodium 125 (*) 135 - 145 (mEq/L)    Potassium 4.0  3.5 - 5.1 (mEq/L)    Chloride 91 (*) 96 - 112 (mEq/L)    CO2 20  19 - 32 (mEq/L)    Glucose, Bld 355 (*) 70 - 99 (mg/dL)    BUN 74 (*) 6 - 23 (mg/dL)    Creatinine, Ser 2.99 (*) 0.50 - 1.35 (mg/dL)    Calcium 7.9 (*) 8.4 - 10.5 (mg/dL)    Total Protein 6.3  6.0 - 8.3 (g/dL)    Albumin 1.6 (*) 3.5 - 5.2 (g/dL)    AST 112 (*) 0 - 37 (U/L)    ALT 82 (*) 0 - 53 (U/L)    Alkaline Phosphatase 508 (*) 39 - 117 (U/L)    Total Bilirubin 2.7 (*) 0.3 - 1.2 (mg/dL)    GFR calc non Af Amer 24 (*) >90 (mL/min)    GFR calc Af Amer 27 (*) >90 (mL/min)   GLUCOSE, CAPILLARY     Status: Abnormal   Collection Time   03/13/12  7:57 AM      Component Value Range Comment   Glucose-Capillary 308 (*) 70 - 99 (mg/dL)   PROTIME-INR     Status: Normal   Collection Time   03/13/12  8:10 AM      Component Value Range Comment   Prothrombin Time 13.9  11.6 - 15.2 (seconds)    INR 1.05  0.00 - 1.49     Dg Chest 2  View  03/11/2012  *RADIOLOGY REPORT*  Clinical Data: Shortness of breath with right lower chest pain and fever.  CHEST - 2 VIEW  Comparison: None.  Findings: Trachea is midline.  Heart size normal.  There is dense airspace consolidation in the right lower lobe.  Question mild left lower lobe air space disease.  No left pleural fluid.  IMPRESSION:  1.  Dense airspace consolidation in the right lower lobe is most consistent with pneumonia.  Follow-up to clearing is recommended, to exclude a centrally obstructing mass. 2.  Question mild left lower lobe air space disease.  Original  Report Authenticated By: Luretha Rued, M.D.   Ct Angio Chest W/cm &/or Wo Cm  03/11/2012  *RADIOLOGY REPORT*  Clinical Data: Fever.  Cough.  Weakness.  Short of breath.  CT ANGIOGRAPHY CHEST  Technique:  Multidetector CT imaging of the chest using the standard protocol during bolus administration of intravenous contrast. Multiplanar reconstructed images including MIPs were obtained and reviewed to evaluate the vascular anatomy.  Contrast: 10mL OMNIPAQUE IOHEXOL 350 MG/ML IV SOLN  Comparison: Chest radiography same day  Findings: Pulmonary arterial opacification is excellent.  There are no pulmonary emboli.  There is consolidative pneumonia throughout the right lower lobe with minor involvement of the right upper lobe.  There is consolidative pneumonia less extensively in the posterior aspect of the left lower lobe.  There is a small amount of pleural fluid on the right.  There is a tiny amount of pericardial fluid.  There are small mediastinal lymph nodes, likely reactive.  No upper abdominal pathology is seen.  IMPRESSION: No pulmonary emboli.  Consolidative pneumonia throughout the right lower lobe.  Partial involvement of the left lower lobe by consolidative  pneumonia. Minimal involvement of the right upper lobe.  Original Report Authenticated By: Jules Schick, M.D.   US Abdomen Complete  03/12/2012  *RADIOLOGY REPORT*   Clinical Data:  Right upper quadrant abdominal pain, elevated liver function tests  ABDOMINAL ULTRASOUND COMPLETE  Comparison:  Chest CT 03/11/2012 with incomplete visualization of the liver  Findings:  Gallbladder:  Minimal dependent sludge is noted within the gallbladder.  The gallbladder is not distended, with gallbladder wall thickness measuring borderline thickened at 4 mm.  No shadowing calculus or sonographic Murphy's sign noted.  Common Bile Duct:  Within normal limits in caliber.  Liver: No focal mass lesion identified.  Within normal limits in parenchymal echogenicity.  IVC:  Appears normal.  Pancreas:  No abnormality identified.  Spleen:  Within normal limits in size and echotexture.  Right kidney:  Normal in size and parenchymal echogenicity.  No evidence of mass or hydronephrosis.  Left kidney:  Normal in size and parenchymal echogenicity.  No evidence of mass or hydronephrosis.  Abdominal Aorta:  Small amount of ascites is noted.  Trace left pleural effusion.  IMPRESSION: Minimal sludge within the gallbladder with borderline wall thickening.  This could indicate cholecystitis in the appropriate clinical context.  Trace ascites.  Original Report Authenticated By: Arline Asp, M.D.       Assessment/Plan 1. Right lower lobe PNA 2. Elevated LFTs 3. DM 4. ARF  Plan: 1. The patient does not appear to be symptomatic in the abdomen c/w with cholecystitis.  I suspect his ultrasound findings and LFTs are related to his significant RLL PNA.  However, because his LFTs are elevated, we will obtain a HIDA scan to completely rule out any form of gallbladder infection.  We will make him NPO and hold his narcotics for 6 hrs prior to the test.  I have discussed this with the patient.  We will defer further treatment to the primary medical service.  We will follow along.  Thank you for this consultation.  Janice Bodine E 03/13/2012, 11:42 AM

## 2012-03-13 NOTE — Consult Note (Signed)
I have seen and examined the patient and agree with the assessment and plans.  Will check HIDA scan  Lilac Hoff A. [name] Linden  MD, FACS

## 2012-03-13 NOTE — Telephone Encounter (Signed)
I reviewed the case - please order a CT scan chest WITH contrast. Thanks.

## 2012-03-13 NOTE — Consult Note (Signed)
Name: [name] [name] MRN: [id number] DOB: 01/07/1966    LOS: 2  [facility] Note   History of Present Illness:  46 y/o M with PMH of DM (poorly controlled) admitted on 3/21 per [facility] with a month long history of feeling poorly. His daughter had recently been diagnosed with strep throat.  He was seen at an UC and told he had flu and Rx'd with Tamiflu. He continued to worsen and developed R sided chest pain with deep breathing. He was seen by his PCP and referred to ED where CTA of Chest demonstrated bilateral pneumonia R>L on 3/20.  PCCM consulted for pneumonia / hemoptysis 3/22.  Of note, blood cultures prelim positive for streptococcus species.    Indicates initially had non-productive cough, fevers, chills, weight loss, significant fatigue.  Progressed to rusty colored sputum production.   Cultures: BCx2 3/20>>>streptococcus species>>> Sputum 3/22>>>   Antibiotics: Azithromycin (CAP) 3/20>>> Rocephin (CAP) 3/20>>>   Tests / Events: CTA CHEST 3/20>>>No pulmonary emboli. Consolidative pneumonia throughout the right lower lobe. Partial involvement of the left lower lobe by consolidative pneumonia. Minimal involvement of the right upper lobe.  3/21 HIV>>>non-reactive  Past Medical History  Diagnosis Date  . Flu   . Diabetes mellitus     History reviewed. No pertinent past surgical history.  Prior to Admission medications   Medication Sig Start Date End Date Taking? Authorizing Provider  aspirin 81 MG tablet Take 81 mg by mouth daily.   Yes Historical Provider, MD  azithromycin (ZITHROMAX) 250 MG tablet Take 250 mg by mouth daily.   Yes Historical Provider, MD  insulin NPH (HUMULIN N,NOVOLIN N) 100 UNIT/ML injection Inject 15 Units into the skin.   Yes Historical Provider, MD  insulin regular (NOVOLIN R,HUMULIN R) 100 units/mL injection Inject 15 Units into the skin every morning.   Yes Historical Provider, MD  lisinopril  (PRINIVIL,ZESTRIL) 5 MG tablet Take 5 mg by mouth daily.   Yes Historical Provider, MD  Multiple Vitamin (MULTIVITAMIN) capsule Take 1 capsule by mouth daily.   Yes Historical Provider, MD  oseltamivir (TAMIFLU) 75 MG capsule Take 75 mg by mouth 2 (two) times daily.   Yes Historical Provider, MD  rosuvastatin (CRESTOR) 10 MG tablet Take 10 mg by mouth daily.   Yes Historical Provider, MD    Allergies Allergies  Allergen Reactions  . Penicillins Rash    Tolerating Ceftriaxone (03/12/12)    Family History History reviewed. No pertinent family history.  Social History  reports that he has never smoked. He has never used smokeless tobacco. He reports that he does not drink alcohol or use illicit drugs.  Review Of Systems:   Gen: Indicates fever, chills, weight change (loss of 20lbs over past 36mo, 175 lbs over couple years), fatigue, night sweats HEENT: Denies blurred vision, double vision, hearing loss, tinnitus, sinus congestion, rhinorrhea, sore throat, neck stiffness, dysphagia PULM: See HPI CV: Denies chest pain, edema, orthopnea, paroxysmal nocturnal dyspnea, palpitations GI: Denies abdominal pain, nausea, vomiting, diarrhea, hematochezia, melena, constipation, change in bowel habits GU: Denies dysuria, hematuria, polyuria, oliguria, urethral discharge Endocrine: Denies hot or cold intolerance, polyuria, polyphagia or appetite change Derm: Denies rash, dry skin, scaling or peeling skin change Heme: Denies easy bruising, bleeding, bleeding gums Neuro: Denies headache, numbness, weakness, slurred speech, loss of memory or consciousness   Vital Signs: Filed Vitals:   03/12/12 1300 03/12/12 2134 03/13/12 0523 03/13/12 0642  BP: 157/85 148/93 163/102 152/90  Pulse: 97 95 94  Temp: 98.1 F (36.7 C) 98.5 F (36.9 C) 98.3 F (36.8 C)   TempSrc: Oral Oral Oral   Resp: 20 20 20    Height:      Weight:      SpO2: 96% 97% 94%     I/O last 3 completed shifts: In: 118  [P.O.:118] Out: 200 [Urine:200]  Physical Examination: General: chronically ill in NAD Neuro: AAOx4, speech clear, MAE CV: s1s2 rrr, no m/r/g PULM: resp's even/non-labored, posterior lower crackles on R GI: flat / soft, bsx4 active, tolerating PO's Extremities: warm/dry, no edema.  Noted acanthos nigricans on back of neck.     Labs    CBC  Lab 03/13/12 0642 03/12/12 0940 03/11/12 1300  HGB 8.4* 8.9* 10.0*  HCT 25.0* 26.0* 28.3*  WBC 21.8* 14.8* 10.1  PLT 243 233 224     BMET  Lab 03/13/12 0642 03/12/12 0940 03/11/12 1300  NA 125* 127* 125*  K 4.0 3.9 --  CL 91* 89* 87*  CO2 20 21 27   GLUCOSE 355* 371* 358*  BUN 74* 50* 29*  CREATININE 2.99* 2.00* 1.10  CALCIUM 7.9* 8.1* 8.3*  MG -- -- --  PHOS -- -- --    Lab 03/13/12 0810 03/12/12 1323  INR 1.05 1.12    Radiology: See above.    Assessment and Plan:  Community acquired pneumonia Assessment: CT with consolidative pneumonia throughout RLL, partial involvement of RUL, LLL.  Blood cultures positive for streptococcus species, lends to streptococcal PNA.  HIV negative.   Weight loss and temporal wasting concerning for other underlying process.   Plan: -CAP coverage with rocephin / azithromycin  (minimum 5 days IV abx therapy) with 14 days total abx -f/u cxr PRN while inpatient -follow up as outpatient to ensure resolution with CT in 6 weeks with Dr. Lamonte Sakai -trend PCT daily x3    Other Medical Problems below per [facility] Diabetes mellitus - apparent poor control of DM.  Recommend Diabetes Coordinator to see patient.  Check cholesterol.  Acute kidney failure Elevated LFTs    Best practices / Disposition: -->Code Status: Full Code -->DVT Px: lovenox -->GI Px: not indicated -->Diet: PO    Noe Gens, NP-C [facility] Pgr: [phone]  03/13/2012, 11:46 AM   Patient is wasting, very large pneumonia, concern for an underlying malignancy is high here.  Agree with Abx but more importantly  will need a repeat chest CT after PNA is treated to look for evidence of any oncologic process in the lungs.  Abx recommendations per ID.  Patient seen and examined, agree with above note.  I dictated the care and orders written for this patient under my direction.  Jennet Maduro, M.D. [phone]

## 2012-03-13 NOTE — Progress Notes (Signed)
Inpatient Diabetes Program Recommendations  AACE/ADA: New Consensus Statement on Inpatient Glycemic Control (2009)  Target Ranges:  Prepandial:   less than 140 mg/dL      Peak postprandial:   less than 180 mg/dL (1-2 hours)      Critically ill patients:  140 - 180 mg/dL   Reason for Visit: CBGs on 03/13/12  308-360 mg/dl  Inpatient Diabetes Program Recommendations Insulin - Basal: Continue Lantus 15 units at HS. Add Lantus 10 units now since CBGs are greater than 300 mg/dl. .  HgbA1C: pending  Note: HgbA1C is 15.7%

## 2012-03-13 NOTE — Progress Notes (Signed)
03/13/12 1600  Spoke with patient about his diabetes.  Was diagnosed in 2001.  Has been seen at [facility]. Has been taking Novolin NPH 35 units and Novolin Regular 15 units every AM.  States that this was more affordable at the time, but now has insurnce that would cover other insulin.  Has been on Lantus Solostar pens in the past.  His job has him working 10 PM to 6 AM.  Has lost weight in the past year.  States has not "been eating right". Does have a home blood glucose meter and strips.   States that he would be willing to attend outpatient diabetes classes.  Recommend that he watch DM videos and talk to a dietician while in hospital.  Staff nurses to check him on insulin administration and give him [place] notes on diabetes.  Will continue to follow while in hospital.  Harvel Ricks RN BSN

## 2012-03-13 NOTE — Telephone Encounter (Signed)
Dr. Lamonte Sakai, is this okay to order. Please advise thanks

## 2012-03-13 NOTE — Progress Notes (Signed)
Patient ID: [name] [name], male   DOB: [birth year], 46 y.o.   MRN: [id number]  Admit Date: 03/11/2012 [id number], MD, MD POA:   CONSULTS: Pulmonary 3/22 Renal 3/22 Infectious Disease 3/22 General Surgery 3/22  Subjective: Afraid to eat because it makes he have pain and cough up brown liquid. Patient anxious/afraid about his current health (single father of 21yo daughter).  Objective: Vital signs in last 24 hours: Temp:  [98.1 F (36.7 C)-98.5 F (36.9 C)] 98.3 F (36.8 C) (03/22 0523) Pulse Rate:  [94-97] 94  (03/22 0523) Resp:  [20] 20  (03/22 0523) BP: (148-163)/(85-102) 152/90 mmHg (03/22 0642) SpO2:  [94 %-97 %] 94 % (03/22 0523) Weight change:  Last BM Date: 03/11/12  Intake/Output from previous day:  Intake/Output Summary (Last 24 hours) at 03/13/12 0832 Last data filed at 03/13/12 0719  Gross per 24 hour  Intake 3521.75 ml  Output    200 ml  Net 3321.75 ml     Physical Exam:  Gen:  Awake, alert sitting upright in bed, slight diaphoretic Cardiovascular:  S1S2 RRR. No m/r/g, no increased wob Respiratory: No increased wob. LL rales, diminished RLL Gastrointestinal: abdomen soft, RUQ tenderness on palp, ND, BS+, no appreciated masses or hsm Extremities: no c/c/e   Lab Results:  Lab 03/13/12 0642 03/12/12 0940 03/11/12 1300  HGB 8.4* 8.9* 10.0*  HCT 25.0* 26.0* 28.3*  WBC 21.8* 14.8* 10.1  PLT 243 233 224     Lab 03/13/12 0642 03/12/12 0940 03/11/12 1300  NA 125* 127* 125*  K 4.0 3.9 --  CL 91* 89* 87*  CO2 20 21 27   GLUCOSE 355* 371* 358*  BUN 74* 50* 29*  CREATININE 2.99* 2.00* 1.10  CALCIUM 7.9* 8.1* 8.3*  MG -- -- --  PHOS -- -- --    Studies/Results: Dg Chest 2 View   IMPRESSION:  1.  Dense airspace consolidation in the right lower lobe is most consistent with pneumonia.  Follow-up to clearing is recommended, to exclude a centrally obstructing mass. 2.  Question mild left lower lobe air space disease.  Original Report  Authenticated By: Luretha Rued, M.D.   Ct Angio Chest W/cm &/or Wo Cm  IMPRESSION: No pulmonary emboli.  Consolidative pneumonia throughout the right lower lobe.  Partial involvement of the left lower lobe by consolidative  pneumonia. Minimal involvement of the right upper lobe.  Original Report Authenticated By: Jules Schick, M.D.   US Abdomen Complete  03/12/2012  *RADIOLOGY REPORT*  Clinical Data:  Right upper quadrant abdominal pain, elevated liver function tests  ABDOMINAL ULTRASOUND COMPLETE   IMPRESSION: Minimal sludge within the gallbladder with borderline wall thickening.  This could indicate cholecystitis in the appropriate clinical context.  Trace ascites.  Original Report Authenticated By: Arline Asp, M.D.    Medications: Scheduled Meds:    . aspirin  81 mg Oral Daily  . azithromycin  500 mg Intravenous Q24H  . cefTRIAXone (ROCEPHIN)  IV  1 g Intravenous Q24H  . enoxaparin  40 mg Subcutaneous Q24H  . insulin aspart  0-15 Units Subcutaneous TID WC  . insulin aspart  0-5 Units Subcutaneous QHS  . insulin aspart  10 Units Subcutaneous Once  . insulin glargine  10 Units Subcutaneous QHS  . mulitivitamin with minerals  1 tablet Oral Daily  . vancomycin  750 mg Intravenous Q12H  . DISCONTD: atorvastatin  20 mg Oral q1800  . DISCONTD: insulin glargine  5 Units Subcutaneous QHS  . DISCONTD: insulin regular  10 Units Intravenous Once  . DISCONTD: lisinopril  5 mg Oral Daily  . DISCONTD: oseltamivir  75 mg Oral BID   Continuous Infusions:    . sodium chloride 150 mL/hr at 03/13/12 0719   PRN Meds:.acetaminophen, acetaminophen, guaiFENesin, hydrALAZINE, ondansetron (ZOFRAN) IV, ondansetron, oxyCODONE Antibiotics: Anti-infectives     Start     Dose/Rate Route Frequency Ordered Stop   03/12/12 1500   azithromycin (ZITHROMAX) 500 mg in dextrose 5 % 250 mL IVPB        500 mg 250 mL/hr over 60 Minutes Intravenous Every 24 hours 03/12/12 0322     03/12/12 1500    vancomycin (VANCOCIN) 750 mg in sodium chloride 0.9 % 150 mL IVPB        750 mg 150 mL/hr over 60 Minutes Intravenous Every 12 hours 03/12/12 1359     03/12/12 1400   cefTRIAXone (ROCEPHIN) 1 g in dextrose 5 % 50 mL IVPB        1 g 100 mL/hr over 30 Minutes Intravenous Every 24 hours 03/12/12 0322     03/12/12 0330   oseltamivir (TAMIFLU) capsule 75 mg  Status:  Discontinued     Comments: Only for today.      75 mg Oral 2 times daily 03/12/12 0322 03/12/12 1248   03/11/12 1345   cefTRIAXone (ROCEPHIN) 1 g in dextrose 5 % 50 mL IVPB        1 g 100 mL/hr over 30 Minutes Intravenous  Once 03/11/12 1336 03/11/12 1451   03/11/12 1345   azithromycin (ZITHROMAX) 500 mg in dextrose 5 % 250 mL IVPB        500 mg 250 mL/hr over 60 Minutes Intravenous  Once 03/11/12 1336 03/11/12 1640           Assessment/Plan:  Active Problems:  Community acquired pneumonia  Acute kidney failure  Elevated LFTs  Diabetes mellitus   Dense RLL Pna, now with min dark hemoptysis in sputum.  Can not exclude malignancy. continue Rocephin,  Azith,  Received 1 dose of vanc 3/21. WBC rising.  Will consult Pulmonary 3/22.    Bacteremia: Grm + cocci in pairs in 2 blood cultures.  On D2 Azith and Rocephin.  Received one dose of Vanc and it was subsequently d/c'd due to bump in creatinine.  ID consulted 3/22.  Acute renal failure/contrast-induced nephropathy: Cr  2.99<--1.10 on admit 3/20.   IVF's, hold ACEI, D/C Vanc (only received 1 dose) U/A with protein uria.  Not making urine overnight 3/21 - 3/22.  Will consult Renal 3/22.  Foley ordered.  Acute hepatitis ? Cholecystitis: Possible cholecystitis on U/S.  Surgery consulted for opinion.  LFTs rising acutely.  Possible etiologies:  Cholecystitis, Congestion from Dense PNA, Sequela of Acute Sepsis.  Will Fractionate Alk Phos, Check GGT, Acute Hep Panel.    DM2, uncontrolled prior to admission: Will change insulin regimen now that pt has rx drug coverage. A1C  pending. Lantus titrating as needed up to 25 (per DM Coordinator Rec) &  SSI. Patient afraid to eat secondary to right upper quad pain and hemoptysis.   Chest pain, pleuritic: continue analgesics. Hyperlipidemia: holding statin due to elevated LFTs. Prophylaxis: on SQ lovenox for DVT prophylaxis    Imogene Burn, PA-C [facility] Pager: [phone]    LOS: 2 days    03/13/2012, 8:32 AM

## 2012-03-13 NOTE — Consult Note (Signed)
[name] [name] Consult Note [facility]  Date: 03/13/2012  Patient name: [name] [name] Medical record number: [id number] Date of birth: 11-Feb-1966 Age: 46 y.o. Gender: male PCP: [name], MD, MD  Medical Service: Nephrology      Chief Complaint: Pneumonia, Septicemia, ARF  History of Present Illness: [name] [name] is a 46 yo male with a hx of diabetes who presented on 3-20 with the chief complaint of pleuritic chest pain. Pt went to urgent care this past Sunday and was dx with the flu. He later develop rt sided chest pain with deep inspiration, N/V, and worsening fatigue and went to see his PCP. PCP recommended follwup in ED. In ED pt underwent CTA which showed a bilateral pneumonia. Pt was admitted and started on Empiric antibiotics. Pulmonary currently following.  Blood cultures were drawn and were positive X 2 for Streptococcus species . Currently the pt is on Rocephin and azithro. ID consulted. Pt was also found to have elevated liver enzymes and possible cholecystitis per ABD [place].  Surgrey was consulted and they feel most likely due to RLL PNA but HIDA scan has been ordered.   Nephrology being consulted for ARF. On admission (3-20) pt's Scr 1.10, 3-21 was 2.00, today 2.99. BUN on admission 29 today 74. GFR on admission was 79 now 24. Pt also had minimal urinary output over the past two day. A foley cath was placed today and currently around 650 cc in foley bag. UA done yesterday shows +glucose, mod HGB, Mod bilirubin, >300 protein, trace leukocytes. Urine NA <10. Urine micro yesterday showed few bacteria and granular casts. Abd US done and showed Normal kidney size and parenchymal echogenicity bilaterally with no evidence of mass or hydronephrosis.   The pt denies ever having any issues with his kidneys in the past. He also denies any issues with his prostate. He said he recently had a PSA done which was normal. He denies having a DRE. Prior to  admission he denies any problems with urination. He does endorse a decrease in urinary output over the past two days. He states no decrease in stream force, feelings of incomplete emptying, dribbling. He denies any dysuria or hematuria. He denies a hx of kidney stones. The pt denies any sob or chest pain. He states his rt sided chest pain is improving. He states he has a productive cough and is able to expectorate his sputum. He does state he noticed some blood in his sputum.  He denies any abd pain. He currently denies any N/V. He does endorse some constipation. He states he normally will have some swelling in his ankles but states it is more prominent today. He notes some weakness and fatigue. He reports an unintentional weight loss of 15lbs over the past 3 month.   Meds: Medications Prior to Admission  Medication Dose Route Frequency Provider Last Rate Last Dose  . 0.9 %  sodium chloride infusion   Intravenous Continuous Melton Alar, PA 125 mL/hr at 03/13/12 1100    . acetaminophen (TYLENOL) tablet 650 mg  650 mg Oral Q6H PRN Rise Patience, MD       Or  . acetaminophen (TYLENOL) suppository 650 mg  650 mg Rectal Q6H PRN Rise Patience, MD      . acetaminophen (TYLENOL) tablet 650 mg  650 mg Oral Once Glendell Docker, NP   650 mg at 03/11/12 1336  . aspirin chewable tablet 81 mg  81 mg Oral Daily Rise Patience, MD  81 mg at 03/13/12 1129  . azithromycin (ZITHROMAX) 500 mg in dextrose 5 % 250 mL IVPB  500 mg Intravenous Once Glendell Docker, NP   500 mg at 03/11/12 1450  . cefTRIAXone (ROCEPHIN) 1 g in dextrose 5 % 50 mL IVPB  1 g Intravenous Once Glendell Docker, NP   1 g at 03/11/12 1421  . cefTRIAXone (ROCEPHIN) 2 g in dextrose 5 % 50 mL IVPB  2 g Intravenous Q24H Melton Alar, PA      . enoxaparin (LOVENOX) injection 40 mg  40 mg Subcutaneous Q24H Rise Patience, MD   40 mg at 03/12/12 1400  . guaiFENesin (ROBITUSSIN) 100 MG/5ML solution 100 mg  5 mL Oral Q4H PRN  Dianne Dun, NP   100 mg at 03/13/12 0329  . hydrALAZINE (APRESOLINE) injection 5 mg  5 mg Intravenous Q6H PRN Dianne Dun, NP   5 mg at 03/13/12 0550  . HYDROcodone-acetaminophen (NORCO) 5-325 MG per tablet 1 tablet  1 tablet Oral Once Glendell Docker, NP   1 tablet at 03/11/12 1450  . HYDROcodone-acetaminophen (NORCO) 5-325 MG per tablet 1 tablet  1 tablet Oral Once Glendell Docker, NP   1 tablet at 03/11/12 2152  . insulin aspart (novoLOG) injection 0-15 Units  0-15 Units Subcutaneous TID WC Patrici Ranks, NP   11 Units at 03/13/12 0848  . insulin aspart (novoLOG) injection 0-5 Units  0-5 Units Subcutaneous QHS Patrici Ranks, NP   4 Units at 03/12/12 2219  . insulin aspart (novoLOG) injection 10 Units  10 Units Subcutaneous Once Patrici Ranks, NP   10 Units at 03/12/12 [phone]  . insulin glargine (LANTUS) injection 15 Units  15 Units Subcutaneous QHS [name] Rumpf York, PA      . insulin regular (NOVOLIN R,HUMULIN R) 100 units/mL injection 10 Units  10 Units Intravenous Once Glendell Docker, NP   10 Units at 03/11/12 1419  . iohexol (OMNIPAQUE) 350 MG/ML injection 80 mL  80 mL Intravenous Once PRN Medication Radiologist, MD   80 mL at 03/11/12 1715  . mulitivitamin with minerals tablet 1 tablet  1 tablet Oral Daily Rise Patience, MD   1 tablet at 03/13/12 1129  . ondansetron (ZOFRAN) tablet 4 mg  4 mg Oral Q6H PRN Rise Patience, MD       Or  . ondansetron [name]) injection 4 mg  4 mg Intravenous Q6H PRN Rise Patience, MD   4 mg at 03/13/12 0540  . oxyCODONE (Oxy IR/ROXICODONE) immediate release tablet 5 mg  5 mg Oral Q4H PRN Rise Patience, MD      . sodium chloride 0.9 % bolus 1,000 mL  1,000 mL Intravenous Once Glendell Docker, NP   1,000 mL at 03/11/12 1450  . sodium chloride 0.9 % bolus 500 mL  500 mL Intravenous Once Glendell Docker, NP      . DISCONTD: atorvastatin (LIPITOR) tablet 20 mg  20 mg Oral q1800 Rise Patience, MD       . DISCONTD: azithromycin (ZITHROMAX) 500 mg in dextrose 5 % 250 mL IVPB  500 mg Intravenous Q24H Rise Patience, MD   500 mg at 03/12/12 1612  . DISCONTD: cefTRIAXone (ROCEPHIN) 1 g in dextrose 5 % 50 mL IVPB  1 g Intravenous Q24H Rise Patience, MD   1 g at 03/12/12 1400  . DISCONTD: insulin aspart (novoLOG) injection 0-9 Units  0-9 Units Subcutaneous TID WC Rise Patience, MD      .  DISCONTD: insulin aspart (novoLOG) injection 15 Units  15 Units Subcutaneous Q breakfast Bynum Bellows, MD      . DISCONTD: insulin glargine (LANTUS) injection 10 Units  10 Units Subcutaneous QHS Patrici Ranks, NP   10 Units at 03/12/12 2218  . DISCONTD: insulin glargine (LANTUS) injection 5 Units  5 Units Subcutaneous QHS Patrici Ranks, NP      . DISCONTD: insulin NPH (HUMULIN N,NOVOLIN N) injection 35 Units  35 Units Subcutaneous BID Rise Patience, MD      . DISCONTD: insulin regular (NOVOLIN R,HUMULIN R) 100 units/mL injection 10 Units  10 Units Intravenous Once Patrici Ranks, NP      . DISCONTD: insulin regular (NOVOLIN R,HUMULIN R) 100 units/mL injection 15 Units  15 Units Subcutaneous q morning - 10a Rise Patience, MD      . DISCONTD: lisinopril (PRINIVIL,ZESTRIL) tablet 5 mg  5 mg Oral Daily Rise Patience, MD   5 mg at 03/12/12 1042  . DISCONTD: oseltamivir (TAMIFLU) capsule 75 mg  75 mg Oral BID Rise Patience, MD   75 mg at 03/12/12 1042  . DISCONTD: vancomycin (VANCOCIN) 750 mg in sodium chloride 0.9 % 150 mL IVPB  750 mg Intravenous Q12H Sorin June Leap, MD   750 mg at 03/13/12 0229   No current outpatient prescriptions on file as of 03/13/2012.    Allergies: Penicillins Past Medical History  Diagnosis Date  . Flu   . Diabetes mellitus    History reviewed. No pertinent past surgical history. History reviewed. No pertinent family history. History   Social History  . Marital Status: Married    Spouse Name: N/A    Number of Children: N/A  . Years of  Education: N/A   Occupational History  . Not on file.   Social History Main Topics  . Smoking status: Never Smoker   . Smokeless tobacco: Never Used  . Alcohol Use: No  . Drug Use: No  . Sexually Active:    Other Topics Concern  . Not on file   Social History Narrative  . No narrative on file    Review of Systems: As stated in HPI  Physical Exam: Blood pressure 152/90, pulse 94, temperature 98.3 F (36.8 C), temperature source Oral, resp. rate 20, height 6' 0.2" (1.834 m), weight 84.2 kg (185 lb 10 oz), SpO2 94.00%.  Constitutional: Appears ill but in no acute distress. Sitting on edge of bed. He is oriented to person, place, and time. He appears well-developed and well-nourished.  HENT:  Head: Normocephalic and atraumatic.  Mouth/Throat: Oropharynx is clear and moist.  Eyes: Conjunctivae and EOM are normal. PERRLA Neck: Neck supple. No JVD Cardiovascular: Normal rate and regular rhythm. No Murmurs gallops or rubs. Pulmonary/Chest: Effort normal. BS diminished in R base, Bibasilar rales R>L,  Diffuse rhonchi improves with cough. No wheezes Abdominal: Soft. Bowel sounds are normal. There is no tenderness. Bladder does not feel distended Neurological: He is alert and oriented to person, place, and time.  Skin: Skin is warm and dry.  Ext- Trace pedal edema noted bilaterally.  Psychiatric: Appears depressed with somewhat flat affect  Lab results: Basic Metabolic Panel:  Lab [id number] 03/12/12 0940 03/11/12 1300  NA 125* 127* 125*  K 4.0 3.9 4.2  CL 91* 89* 87*  CO2 20 21 27   GLUCOSE 355* 371* 358*  BUN 74* 50* 29*  CREATININE 2.99* 2.00* 1.10  CALCIUM 7.9* 8.1* 8.3*  ALB -- -- --  PHOS -- -- --  Liver Function Tests:  Lab 03/13/12 0642 03/12/12 0940  AST 112* 60*  ALT 82* 78*  ALKPHOS 508* 390*  BILITOT 2.7* 3.2*  PROT 6.3 6.4  ALBUMIN 1.6* 1.6*   No results found for this basename: LIPASE:3,AMYLASE:3 in the last 168 hours  Lab 03/12/12 1322    AMMONIA 22   INR: @resultsinr3 @ CBC:  Lab 03/13/12 0642 03/12/12 0940 03/11/12 1300  WBC 21.8* 14.8* 10.1  NEUTROABS -- -- --  HGB 8.4* 8.9* 10.0*  HCT 25.0* 26.0* 28.3*  MCV 84.7 83.3 80.9  PLT 243 233 224   Blood Culture    Component Value Date/Time   SDES BLOOD RIGHT HAND 03/11/2012 1410   SPECREQUEST NONE BOTTLES DRAWN AEROBIC AND ANAEROBIC 5CC EACH 03/11/2012 1410   CULT  Value: GRAM POSITIVE COCCI IN PAIRS STREPTOCOCCUS SPECIES Note: Gram Stain Report Called to,Read Back By and Verified With: COURTNEY DRIGGERS @ [id number] 03/12/12 WICKN 03/11/2012 1410   REPTSTATUS PENDING 03/11/2012 1410    Cardiac Enzymes: No results found for this basename: CKTOTAL:5,CKMB:5,CKMBINDEX:5,TROPONINI:5 in the last 168 hours CBG:  Lab 03/13/12 0757 03/12/12 2131 03/12/12 1732 03/12/12 1105 03/12/12 0742  GLUCAP 308* 333* 344* 357* 410*   Iron Studies: No results found for this basename: IRON,TIBC,TRANSFERRIN,FERRITIN in the last 72 hours  Micro Results: Recent Results (from the past 240 hour(s))  CULTURE, BLOOD (ROUTINE X 2)     Status: Normal (Preliminary result)   Collection Time   03/11/12  1:00 PM      Component Value Range Status Comment   Specimen Description BLOOD RIGHT ANTECUBITAL   Final    Special Requests NONE BOTTLES DRAWN AEROBIC AND ANAEROBIC [facility]   Final    Culture  Setup Time [id number]   Final    Culture     Final    Value: STREPTOCOCCUS SPECIES     Note: Gram Stain Report Called to,Read Back By and Verified With: COURTNEY DRIGGERS @ [id number] 03/12/12 WICKN   Report Status PENDING   Incomplete   CULTURE, BLOOD (ROUTINE X 2)     Status: Normal (Preliminary result)   Collection Time   03/11/12  2:10 PM      Component Value Range Status Comment   Specimen Description BLOOD RIGHT HAND   Final    Special Requests NONE BOTTLES DRAWN AEROBIC AND ANAEROBIC [name]   Final    Culture  Setup Time [id number]   Final    Culture     Final    Value: GRAM POSITIVE COCCI IN PAIRS      STREPTOCOCCUS SPECIES     Note: Gram Stain Report Called to,Read Back By and Verified WithHuel Cote @ [id number] 03/12/12 WICKN   Report Status PENDING   Incomplete    Studies/Results: Dg Chest 2 View  03/11/2012  *RADIOLOGY REPORT*  Clinical Data: Shortness of breath with right lower chest pain and fever.  CHEST - 2 VIEW  Comparison: None.  Findings: Trachea is midline.  Heart size normal.  There is dense airspace consolidation in the right lower lobe.  Question mild left lower lobe air space disease.  No left pleural fluid.  IMPRESSION:  1.  Dense airspace consolidation in the right lower lobe is most consistent with pneumonia.  Follow-up to clearing is recommended, to exclude a centrally obstructing mass. 2.  Question mild left lower lobe air space disease.  Original Report Authenticated By: Luretha Rued, M.D.   Ct Angio Chest W/cm &/or Wo Cm  03/11/2012  *  RADIOLOGY REPORT*  Clinical Data: Fever.  Cough.  Weakness.  Short of breath.  CT ANGIOGRAPHY CHEST  Technique:  Multidetector CT imaging of the chest using the standard protocol during bolus administration of intravenous contrast. Multiplanar reconstructed images including MIPs were obtained and reviewed to evaluate the vascular anatomy.  Contrast: 52mL OMNIPAQUE IOHEXOL 350 MG/ML IV SOLN  Comparison: Chest radiography same day  Findings: Pulmonary arterial opacification is excellent.  There are no pulmonary emboli.  There is consolidative pneumonia throughout the right lower lobe with minor involvement of the right upper lobe.  There is consolidative pneumonia less extensively in the posterior aspect of the left lower lobe.  There is a small amount of pleural fluid on the right.  There is a tiny amount of pericardial fluid.  There are small mediastinal lymph nodes, likely reactive.  No upper abdominal pathology is seen.  IMPRESSION: No pulmonary emboli.  Consolidative pneumonia throughout the right lower lobe.  Partial involvement of the  left lower lobe by consolidative  pneumonia. Minimal involvement of the right upper lobe.  Original Report Authenticated By: Jules Schick, M.D.   US Abdomen Complete  03/12/2012  *RADIOLOGY REPORT*  Clinical Data:  Right upper quadrant abdominal pain, elevated liver function tests  ABDOMINAL ULTRASOUND COMPLETE  Comparison:  Chest CT 03/11/2012 with incomplete visualization of the liver  Findings:  Gallbladder:  Minimal dependent sludge is noted within the gallbladder.  The gallbladder is not distended, with gallbladder wall thickness measuring borderline thickened at 4 mm.  No shadowing calculus or sonographic Murphy's sign noted.  Common Bile Duct:  Within normal limits in caliber.  Liver: No focal mass lesion identified.  Within normal limits in parenchymal echogenicity.  IVC:  Appears normal.  Pancreas:  No abnormality identified.  Spleen:  Within normal limits in size and echotexture.  Right kidney:  Normal in size and parenchymal echogenicity.  No evidence of mass or hydronephrosis.  Left kidney:  Normal in size and parenchymal echogenicity.  No evidence of mass or hydronephrosis.  Abdominal Aorta:  Small amount of ascites is noted.  Trace left pleural effusion.  IMPRESSION: Minimal sludge within the gallbladder with borderline wall thickening.  This could indicate cholecystitis in the appropriate clinical context.  Trace ascites.  Original Report Authenticated By: Arline Asp, M.D.    Assessment & Plan by Problem: 1. Pneumonia- bilateral pneumonia per CTA on 3-20. Pt's pleuritic rt sided chest pain improving. Pt on Rocephin and Azithro. Pt did note some hemoptysis.  Pulmonary consulted.  2. Bacteremia: Grm + cocci in pairs in 2 blood cultures. Streptococcus Species identified. On D2 Azith and Rocephin. Received one dose of Vanc and it was subsequently d/c'd due to bump in creatinine. ID consulted 3/22. 3. ARF- Possibly multifactorial ATN due to septicemia and contrast dye administration.  Cannot rule out obstruction due to 600cc out after foley catheter placement. Renal US ordered. On admission (3-20) pt's Scr 1.10, 3-21 was 2.00, today 2.99. BUN on admission 29 today 74. GFR on admission was 79 now 24. Pt also had minimal urinary output over the past two day. A foley cath was placed today and currently around 650 cc in foley bag. UA done yesterday shows +glucose, mod HGB, Mod bilirubin, >300 protein, trace leukocytes. Urine NA <10. Urine micro yesterday showed few bacteria and granular casts. Abd US done and showed Normal kidney size and parenchymal echogenicity bilaterally with no evidence of mass or hydronephrosis.Continue to closely monitor I&O. Renal panel in AM. No  Acute HD needs. 4.Acute hepatitis/Cholecystitis: Possible cholecystitis on U/S. Surgery consulted and states most likely due to RLL PNA but HIDA scan ordered. Marland Kitchen LFTs rising acutely. GGT, Acute Hep Panel ordered and pending.  5. Hyponatremia- NA 125, pt getting NS infusion at 166ml/hr.  6. Anemia- HGB 8.4, closely follow  7. DM2, uncontrolled prior to admission: per primary  This is a Careers information officer Note.  The care of the patient was discussed with Dr. Posey Pronto and the assessment and plan was formulated with their assistance.  Please see their note for official documentation of the patient encounter.   Signed: Gabriel Rainwater  PA-S2 03/13/2012, 12:52 PM   I have seen and examined this patient and agree with the assessment/plan as outlined above by [facility] PA student. I have independently examined the patient and updated the above note as needed. The patient most probably has contrast-induced nephropathy from the CT angiogram that he got upon initial evaluation likely in the face of volume depletion due to diminished oral intake from his pneumonia. He had some urinary retention here in the [name] relieved by Foley catheter (yielding 600 cc of clear-looking urine). No acute volume concerns or electrolyte issues noted to prompt  a need for renal replacement therapy at this time. He likely has a background of diabetic kidney disease given his 13 years of diabetes and proteinuria seen on dipstick urinalysis. He also has hyponatremia from acute renal failure versus SIADH from acute respiratory illness. Will check a serum and urine osmolality to further corroborate the suspicion. Avoid nephrotoxins including NSAIDs. Maintain strict Input and Output monitoring including daily standing weights if feasible. Will continue to monitor clinically with labs and daily exams and intervene as indicated.  Jeralynn Vaquera K.,MD 03/13/2012 2:26 PM

## 2012-03-14 ENCOUNTER — Inpatient Hospital Stay (HOSPITAL_COMMUNITY): Payer: Managed Care, Other (non HMO)

## 2012-03-14 DIAGNOSIS — R7881 Bacteremia: Secondary | ICD-10-CM | POA: Diagnosis present

## 2012-03-14 LAB — MICROALBUMIN / CREATININE URINE RATIO
Creatinine, Urine: 103 mg/dL
Microalb Creat Ratio: 614.7 mg/g — ABNORMAL HIGH (ref 0.0–30.0)
Microalb, Ur: 63.31 mg/dL — ABNORMAL HIGH (ref 0.00–1.89)

## 2012-03-14 LAB — COMPREHENSIVE METABOLIC PANEL
ALT: 105 U/L — ABNORMAL HIGH (ref 0–53)
AST: 140 U/L — ABNORMAL HIGH (ref 0–37)
Albumin: 1.6 g/dL — ABNORMAL LOW (ref 3.5–5.2)
Alkaline Phosphatase: 586 U/L — ABNORMAL HIGH (ref 39–117)
BUN: 83 mg/dL — ABNORMAL HIGH (ref 6–23)
CO2: 21 mEq/L (ref 19–32)
Calcium: 8 mg/dL — ABNORMAL LOW (ref 8.4–10.5)
Chloride: 94 mEq/L — ABNORMAL LOW (ref 96–112)
Creatinine, Ser: 2.86 mg/dL — ABNORMAL HIGH (ref 0.50–1.35)
GFR calc Af Amer: 29 mL/min — ABNORMAL LOW (ref >90)
GFR calc non Af Amer: 25 mL/min — ABNORMAL LOW (ref >90)
Glucose, Bld: 256 mg/dL — ABNORMAL HIGH (ref 70–99)
Potassium: 3.5 mEq/L (ref 3.5–5.1)
Sodium: 127 mEq/L — ABNORMAL LOW (ref 135–145)
Total Bilirubin: 1.6 mg/dL — ABNORMAL HIGH (ref 0.3–1.2)
Total Protein: 6.2 g/dL (ref 6.0–8.3)

## 2012-03-14 LAB — CBC
HCT: 23 % — ABNORMAL LOW (ref 39.0–52.0)
Hemoglobin: 7.8 g/dL — ABNORMAL LOW (ref 13.0–17.0)
MCH: 28.2 pg (ref 26.0–34.0)
MCHC: 33.9 g/dL (ref 30.0–36.0)
MCV: 83 fL (ref 78.0–100.0)
Platelets: 291 10*3/uL (ref 150–400)
RBC: 2.77 MIL/uL — ABNORMAL LOW (ref 4.22–5.81)
RDW: 12.2 % (ref 11.5–15.5)
WBC: 29.4 10*3/uL — ABNORMAL HIGH (ref 4.0–10.5)

## 2012-03-14 LAB — GLUCOSE, CAPILLARY
Glucose-Capillary: 248 mg/dL — ABNORMAL HIGH (ref 70–99)
Glucose-Capillary: 244 mg/dL — ABNORMAL HIGH (ref 70–99)
Glucose-Capillary: 318 mg/dL — ABNORMAL HIGH (ref 70–99)

## 2012-03-14 LAB — OSMOLALITY: Osmolality: 305 mOsm/kg — ABNORMAL HIGH (ref 275–300)

## 2012-03-14 LAB — CULTURE, BLOOD (ROUTINE X 2)
Culture  Setup Time: 201303201935
Culture  Setup Time: 201303201934

## 2012-03-14 LAB — OSMOLALITY, URINE: Osmolality, Ur: 298 mOsm/kg — ABNORMAL LOW (ref 390–1090)

## 2012-03-14 LAB — PROCALCITONIN: Procalcitonin: 18.65 ng/mL

## 2012-03-14 MED ORDER — SENNOSIDES-DOCUSATE SODIUM 8.6-50 MG PO TABS
1.0000 | ORAL_TABLET | Freq: Two times a day (BID) | ORAL | Status: DC
  Administered 2012-03-14 – 2012-03-16 (×4): 1 via ORAL
  Filled 2012-03-14 (×4): qty 1

## 2012-03-14 MED ORDER — TECHNETIUM TC 99M MEBROFENIN IV KIT
5.0000 | PACK | Freq: Once | INTRAVENOUS | Status: AC | PRN
  Administered 2012-03-14: 5 via INTRAVENOUS

## 2012-03-14 MED ORDER — INSULIN GLARGINE 100 UNIT/ML ~~LOC~~ SOLN
30.0000 [IU] | Freq: Every day | SUBCUTANEOUS | Status: DC
Start: 2012-03-14 — End: 2012-03-15
  Administered 2012-03-14: 30 [IU] via SUBCUTANEOUS

## 2012-03-14 NOTE — Progress Notes (Signed)
Nutrition Brief Note:  Pt with several family members in room at time of visit.  When asked if now was a good time for education, pt requested a later visit.  RD will check back as able.  Docia Barrier Pager: [phone]

## 2012-03-14 NOTE — Progress Notes (Signed)
<principal problem not specified>   Subjective: He denies any abdominal pain or nausea. He is very hungry.  Objective: Vital signs in last 24 hours: Temp:  [98 F (36.7 C)-98.2 F (36.8 C)] 98.2 F (36.8 C) (03/23 0546) Pulse Rate:  [90-101] 90  (03/23 0546) Resp:  [18-20] 19  (03/23 0546) BP: (137-155)/(82-97) 155/97 mmHg (03/23 0546) SpO2:  [91 %-97 %] 91 % (03/23 0546) Last BM Date: 03/11/12  Intake/Output from previous day: 03/22 0701 - 03/23 0700 In: 3643.8 [P.O.:240; I.V.:3403.8] Out: 1950 [Urine:1950] Intake/Output this shift:    General appearance: alert, cooperative and mild distress GI: soft, non-tender; bowel sounds normal; no masses,  no organomegaly  Lab Results:  Results for orders placed during the hospital encounter of 03/11/12 (from the past 24 hour(s))  GLUCOSE, CAPILLARY     Status: Abnormal   Collection Time   03/13/12  5:06 PM      Component Value Range   Glucose-Capillary 335 (*) 70 - 99 (mg/dL)  MICROALBUMIN / CREATININE URINE RATIO     Status: Abnormal   Collection Time   03/13/12  6:20 PM      Component Value Range   Microalb, Ur 63.31 (*) 0.00 - 1.89 (mg/dL)   Creatinine, Urine 103.0     Microalb Creat Ratio 614.7 (*) 0.0 - 30.0 (mg/g)  GLUCOSE, CAPILLARY     Status: Abnormal   Collection Time   03/13/12  9:07 PM      Component Value Range   Glucose-Capillary 258 (*) 70 - 99 (mg/dL)  OSMOLALITY, URINE     Status: Abnormal   Collection Time   03/14/12  5:40 AM      Component Value Range   Osmolality, Ur 298 (*) 390 - 1090 (mOsm/kg)  COMPREHENSIVE METABOLIC PANEL     Status: Abnormal   Collection Time   03/14/12  6:50 AM      Component Value Range   Sodium 127 (*) 135 - 145 (mEq/L)   Potassium 3.5  3.5 - 5.1 (mEq/L)   Chloride 94 (*) 96 - 112 (mEq/L)   CO2 21  19 - 32 (mEq/L)   Glucose, Bld 256 (*) 70 - 99 (mg/dL)   BUN 83 (*) 6 - 23 (mg/dL)   Creatinine, Ser 2.86 (*) 0.50 - 1.35 (mg/dL)   Calcium 8.0 (*) 8.4 - 10.5 (mg/dL)   Total  Protein 6.2  6.0 - 8.3 (g/dL)   Albumin 1.6 (*) 3.5 - 5.2 (g/dL)   AST 140 (*) 0 - 37 (U/L)   ALT 105 (*) 0 - 53 (U/L)   Alkaline Phosphatase 586 (*) 39 - 117 (U/L)   Total Bilirubin 1.6 (*) 0.3 - 1.2 (mg/dL)   GFR calc non Af Amer 25 (*) >90 (mL/min)   GFR calc Af Amer 29 (*) >90 (mL/min)  CBC     Status: Abnormal   Collection Time   03/14/12  6:50 AM      Component Value Range   WBC 29.4 (*) 4.0 - 10.5 (K/uL)   RBC 2.77 (*) 4.22 - 5.81 (MIL/uL)   Hemoglobin 7.8 (*) 13.0 - 17.0 (g/dL)   HCT 23.0 (*) 39.0 - 52.0 (%)   MCV 83.0  78.0 - 100.0 (fL)   MCH 28.2  26.0 - 34.0 (pg)   MCHC 33.9  30.0 - 36.0 (g/dL)   RDW 12.2  11.5 - 15.5 (%)   Platelets 291  150 - 400 (K/uL)  PROCALCITONIN     Status: Normal  Collection Time   03/14/12  6:50 AM      Component Value Range   Procalcitonin 18.65    GLUCOSE, CAPILLARY     Status: Abnormal   Collection Time   03/14/12  8:10 AM      Component Value Range   Glucose-Capillary 248 (*) 70 - 99 (mg/dL)     Studies/Results Radiology     MEDS, Scheduled    . aspirin  81 mg Oral Daily  . cefTRIAXone (ROCEPHIN)  IV  2 g Intravenous Q24H  . enoxaparin  40 mg Subcutaneous Q24H  . insulin aspart  0-15 Units Subcutaneous TID WC  . insulin aspart  0-5 Units Subcutaneous QHS  . insulin glargine  30 Units Subcutaneous QHS  . living well with diabetes book   Does not apply Once  . mulitivitamin with minerals  1 tablet Oral Daily  . DISCONTD: insulin glargine  15 Units Subcutaneous QHS  . DISCONTD: insulin glargine  25 Units Subcutaneous QHS     Assessment: <principal problem not specified> His HIDA scan is normal, so does not appear that cholecystitis is an issue at this time  Plan: We will sign off and see again prn.   LOS: 3 days    Haywood Lasso, MD, [facility], [place]   03/14/2012 12:39 PM

## 2012-03-14 NOTE — Progress Notes (Addendum)
[facility] PROGRESS NOTE Resident Note   Please see below for attending addendum to resident note.  Subjective:   Denies any chest pain, SOB, abdominal pain. Noted increased urine output   Objective:    Vital Signs:   Temp:  [98 F (36.7 C)-98.2 F (36.8 C)] 98.2 F (36.8 C) (03/23 0546) Pulse Rate:  [90-101] 90  (03/23 0546) Resp:  [18-20] 19  (03/23 0546) BP: (137-155)/(82-97) 155/97 mmHg (03/23 0546) SpO2:  [91 %-97 %] 91 % (03/23 0546) Last BM Date: 03/11/12  24-hour weight change: Weight change:   Weight trends: Filed Weights   03/11/12 1244 03/12/12 0012  Weight: 185 lb (83.915 kg) 185 lb 10 oz (84.2 kg)    Intake/Output:  03/22 0701 - 03/23 0700 In: 3643.8 [P.O.:240; I.V.:3403.8] Out: 1950 [Urine:1950]  Physical Exam: General: Vital signs reviewed and noted. Well-developed, well-nourished, in no acute distress; alert, appropriate and cooperative throughout examination.  Lungs:  Normal respiratory effort.BS diminished in the right bases. Few rales more on R>L  Heart: RRR. S1 and S2 normal   Abdomen:  BS normoactive. Soft, Nondistended, non-tender.    Extremities: No pretibial edema.     Labs: Basic Metabolic Panel:  Lab [id number] 0650 03/13/12 0642 03/12/12 0940 03/11/12 1300  NA 127* 125* 127* 125*  K 3.5 4.0 3.9 4.2  CL 94* 91* 89* 87*  CO2 21 20 21 27   GLUCOSE 256* 355* 371* 358*  BUN 83* 74* 50* 29*  CREATININE 2.86* 2.99* 2.00* 1.10  CALCIUM 8.0* 7.9* 8.1* --  MG -- -- -- --  PHOS -- -- -- --    Liver Function Tests:  Lab 03/14/12 0650 03/13/12 0642 03/12/12 0940  AST 140* 112* 60*  ALT 105* 82* 78*  ALKPHOS 586* 508* 390*  BILITOT 1.6* 2.7* 3.2*  PROT 6.2 6.3 6.4  ALBUMIN 1.6* 1.6* 1.6*   No results found for this basename: LIPASE:5,AMYLASE:5 in the last 168 hours  Lab 03/12/12 1322  AMMONIA 22    CBC:  Lab 03/14/12 0650 03/13/12 0642 03/12/12 0940 03/11/12 1300  WBC 29.4* 21.8* 14.8* 10.1  NEUTROABS -- -- -- --  HGB  7.8* 8.4* 8.9* 10.0*  HCT 23.0* 25.0* 26.0* 28.3*  MCV 83.0 84.7 83.3 80.9  PLT 291 243 233 224    Cardiac Enzymes: No results found for this basename: CKTOTAL:5,CKMB:5,CKMBINDEX:5,TROPONINI:5 in the last 168 hours  BNP: No components found with this basename: POCBNP:5  CBG:  Lab 03/14/12 0810 03/13/12 2107 03/13/12 1706 03/13/12 1121 03/13/12 0757  GLUCAP 248* 258* 335* 360* 308*    Microbiology: Results for orders placed during the hospital encounter of 03/11/12  CULTURE, BLOOD (ROUTINE X 2)     Status: Normal   Collection Time   03/11/12  1:00 PM      Component Value Range Status Comment   Specimen Description BLOOD RIGHT ANTECUBITAL   Final    Special Requests NONE BOTTLES DRAWN AEROBIC AND ANAEROBIC [facility]   Final    Culture  Setup Time [id number]   Final    Culture     Final    Value: STREPTOCOCCUS PNEUMONIAE     Note: SUSCEPTIBILITIES PERFORMED ON PREVIOUS CULTURE WITHIN THE LAST 5 DAYS.     Note: Gram Stain Report Called to,Read Back By and Verified With: COURTNEY DRIGGERS @ [id number] 03/12/12 WICKN   Report Status 03/14/2012 FINAL   Final   CULTURE, BLOOD (ROUTINE X 2)     Status: Normal   Collection Time  03/11/12  2:10 PM      Component Value Range Status Comment   Specimen Description BLOOD RIGHT HAND   Final    Special Requests NONE BOTTLES DRAWN AEROBIC AND ANAEROBIC [facility]   Final    Culture  Setup Time [id number]   Final    Culture     Final    Value: GRAM POSITIVE COCCI IN PAIRS     STREPTOCOCCUS PNEUMONIAE     Note: Gram Stain Report Called to,Read Back By and Verified With: COURTNEY DRIGGERS @ [id number] 03-26-12 WICKN   Report Status 03/14/2012 FINAL   Final    Organism ID, Bacteria STREPTOCOCCUS PNEUMONIAE   Final     Coagulation Studies:  Basename 03/13/12 0810 03/26/2012 1323  LABPROT 13.9 14.6  INR 1.05 1.12    Urinalysis:  Basename Mar 26, 2012 1341 03/11/12 1540  COLORURINE AMBER* AMBER*  LABSPEC 1.039* 1.027  PHURINE 5.0 5.5  GLUCOSEU >1000*  >1000*  HGBUR MODERATE* LARGE*  BILIRUBINUR MODERATE* MODERATE*  KETONESUR NEGATIVE 15*  PROTEINUR >300* >300*  UROBILINOGEN 2.0* 4.0*  NITRITE NEGATIVE NEGATIVE  LEUKOCYTESUR TRACE* NEGATIVE      Imaging: Nm Hepatobiliary  03/14/2012  *RADIOLOGY REPORT*  Clinical Data: Abdominal pain, evaluate for cholecystitis  NUCLEAR MEDICINE HEPATOHBILIARY INCLUDE GB  Radiopharmaceutical:  5.5 mCi technetium 93m Choletec  Comparison: Ultrasound dated Mar 26, 2012.  Findings: Normal hepatic uptake.  Small bowel is visible within 20 minutes.  Gallbladder is visible within 30 minutes, confirming cystic duct patency.  IMPRESSION: Normal hepatobiliary nuclear medicine scan.  Original Report Authenticated By: Julian Hy, M.D.   US Abdomen Complete  2012-03-26  *RADIOLOGY REPORT*  Clinical Data:  Right upper quadrant abdominal pain, elevated liver function tests  ABDOMINAL ULTRASOUND COMPLETE  Comparison:  Chest CT 03/11/2012 with incomplete visualization of the liver  Findings:  Gallbladder:  Minimal dependent sludge is noted within the gallbladder.  The gallbladder is not distended, with gallbladder wall thickness measuring borderline thickened at 4 mm.  No shadowing calculus or sonographic Murphy's sign noted.  Common Bile Duct:  Within normal limits in caliber.  Liver: No focal mass lesion identified.  Within normal limits in parenchymal echogenicity.  IVC:  Appears normal.  Pancreas:  No abnormality identified.  Spleen:  Within normal limits in size and echotexture.  Right kidney:  Normal in size and parenchymal echogenicity.  No evidence of mass or hydronephrosis.  Left kidney:  Normal in size and parenchymal echogenicity.  No evidence of mass or hydronephrosis.  Abdominal Aorta:  Small amount of ascites is noted.  Trace left pleural effusion.  IMPRESSION: Minimal sludge within the gallbladder with borderline wall thickening.  This could indicate cholecystitis in the appropriate clinical context.  Trace  ascites.  Original Report Authenticated By: Arline Asp, M.D.      Medications:    Infusions:    . DISCONTD: sodium chloride 125 mL/hr at 03/13/12 1100    Scheduled Medications:    . aspirin  81 mg Oral Daily  . cefTRIAXone (ROCEPHIN)  IV  2 g Intravenous Q24H  . enoxaparin  40 mg Subcutaneous Q24H  . insulin aspart  0-15 Units Subcutaneous TID WC  . insulin aspart  0-5 Units Subcutaneous QHS  . insulin glargine  30 Units Subcutaneous QHS  . living well with diabetes book   Does not apply Once  . mulitivitamin with minerals  1 tablet Oral Daily  . DISCONTD: insulin glargine  15 Units Subcutaneous QHS  . DISCONTD: insulin glargine  25 Units  Subcutaneous QHS    PRN Medications: acetaminophen, acetaminophen, guaiFENesin, hydrALAZINE, ondansetron (ZOFRAN) IV, ondansetron, oxyCODONE, technetium TC 25M mebrofenin   Assessment/ Plan:    1. ARI- Possibly due to contrast dye administration in the setting of volume depletion. Patient likely has some underlying renal dysfunction in the setting of long history of diabetes.   Abd US done and showed Normal kidney size and parenchymal echogenicity bilaterally with no evidence of mass or hydronephrosis. Renal function stable. Urine output improved from 200 cc to 1.9 liters  - Continue to closely monitor I&O.  - Renal panel in AM. - No Acute HD needs.   1. Pneumonia- bilateral pneumonia per CTA on 3-20. Pt's pleuritic rt sided chest pain improving. Pt on Rocephin and Azithro. Pt did note some hemoptysis. Pulmonary consulted.   2. Bacteremia: Grm + cocci in pairs in 2 blood cultures. Streptococcus Species identified. On D2 Azith and Rocephin. Received one dose of Vanc and it was subsequently d/c'd due to bump in creatinine. ID consulted 3/22.   34.Acute hepatitis/Cholecystitis: Possible cholecystitis on U/S. Surgery consulted and states most likely due to RLL PNA but HIDA scan ordered. Marland Kitchen LFTs rising acutely. GGT, Acute Hep Panel and  HIV negative.   5. Hyponatremia- NA 127 today  increased from 125 yesterday. IV fluid d/c since 3/22 2 pm ,  6. Anemia- HGB down from 8.4 to 7.8 yesterday . Patient may have underlying AOCD.  - Consider FOBT  - closely follow   7. DM2, uncontrolled prior to admission: per primary    Length of Stay: 3 days  Patient history and plan of care reviewed with attending, Dr. Elmarie Shiley.   Rosalia Hammers, MD PGYII, Internal Medicine Resident 03/14/2012, 11:52 AM  I have seen and examined this patient and agree with the assessment/plan as outlined above by Newt Lukes MD (PGY2). Renal function appears stable since yesterday- likely reflecting approach to the plateau phase of ATN.  Yamato Kopf K.,MD 03/14/2012 12:29 PM

## 2012-03-14 NOTE — Progress Notes (Signed)
Subjective: Feels better  Remains afebrile        Physical Exam: Blood pressure 158/86, pulse 104, temperature 98.5 F (36.9 C), temperature source Oral, resp. rate 21, height 6' 0.2" (1.834 m), weight 84.2 kg (185 lb 10 oz), SpO2 92.00%. Patient Vitals for the past 24 hrs:  BP Temp Temp src Pulse Resp SpO2  03/14/12 1421 158/86 mmHg 98.5 F (36.9 C) Oral 104  21  92 %  03/14/12 0546 155/97 mmHg 98.2 F (36.8 C) Oral 90  19  91 %  03/13/12 2152 137/82 mmHg 98.1 F (36.7 C) Oral 94  18  97 %   Wasted  Cvs: rrr Rs: crackles right base Abdomen : soft , nt   Intake/Output Summary (Last 24 hours) at 03/14/12 1458 Last data filed at 03/14/12 1422  Gross per 24 hour  Intake    100 ml  Output   3500 ml  Net  -3400 ml    Investigations:  Recent Results (from the past 240 hour(s))  CULTURE, BLOOD (ROUTINE X 2)     Status: Normal   Collection Time   03/11/12  1:00 PM      Component Value Range Status Comment   Specimen Description BLOOD RIGHT ANTECUBITAL   Final    Special Requests NONE BOTTLES DRAWN AEROBIC AND ANAEROBIC [facility]   Final    Culture  Setup Time [id number]   Final    Culture     Final    Value: STREPTOCOCCUS PNEUMONIAE     Note: SUSCEPTIBILITIES PERFORMED ON PREVIOUS CULTURE WITHIN THE LAST 5 DAYS.     Note: Gram Stain Report Called to,Read Back By and Verified With: COURTNEY DRIGGERS @ [id number] 03/12/12 WICKN   Report Status 03/14/2012 FINAL   Final   CULTURE, BLOOD (ROUTINE X 2)     Status: Normal   Collection Time   03/11/12  2:10 PM      Component Value Range Status Comment   Specimen Description BLOOD RIGHT HAND   Final    Special Requests NONE BOTTLES DRAWN AEROBIC AND ANAEROBIC [facility]   Final    Culture  Setup Time [id number]   Final    Culture     Final    Value: GRAM POSITIVE COCCI IN PAIRS     STREPTOCOCCUS PNEUMONIAE     Note: Gram Stain Report Called to,Read Back By and Verified With: COURTNEY DRIGGERS @ [id number] 03/12/12 WICKN   Report Status  03/14/2012 FINAL   Final    Organism ID, Bacteria STREPTOCOCCUS PNEUMONIAE   Final      Basic Metabolic Panel:  Basename 03/14/12 0650 03/13/12 0642  NA 127* 125*  K 3.5 4.0  CL 94* 91*  CO2 21 20  GLUCOSE 256* 355*  BUN 83* 74*  CREATININE 2.86* 2.99*  CALCIUM 8.0* 7.9*  MG -- --  PHOS -- --   Liver Function Tests:  [facility] 03/14/12 0650 03/13/12 0642  AST 140* 112*  ALT 105* 82*  ALKPHOS 586* 508*  BILITOT 1.6* 2.7*  PROT 6.2 6.3  ALBUMIN 1.6* 1.6*     CBC:  Basename 03/14/12 0650 03/13/12 0642  WBC 29.4* 21.8*  NEUTROABS -- --  HGB 7.8* 8.4*  HCT 23.0* 25.0*  MCV 83.0 84.7  PLT 291 243    Nm Hepatobiliary  03/14/2012  *RADIOLOGY REPORT*  Clinical Data: Abdominal pain, evaluate for cholecystitis  NUCLEAR MEDICINE HEPATOHBILIARY INCLUDE GB  Radiopharmaceutical:  5.5 mCi technetium 92m Choletec  Comparison: Ultrasound dated 03/12/2012.  Findings: Normal hepatic uptake.  Small bowel is visible within 20 minutes.  Gallbladder is visible within 30 minutes, confirming cystic duct patency.  IMPRESSION: Normal hepatobiliary nuclear medicine scan.  Original Report Authenticated By: Julian Hy, M.D.   US Abdomen Complete  03/12/2012  *RADIOLOGY REPORT*  Clinical Data:  Right upper quadrant abdominal pain, elevated liver function tests  ABDOMINAL ULTRASOUND COMPLETE  Comparison:  Chest CT 03/11/2012 with incomplete visualization of the liver  Findings:  Gallbladder:  Minimal dependent sludge is noted within the gallbladder.  The gallbladder is not distended, with gallbladder wall thickness measuring borderline thickened at 4 mm.  No shadowing calculus or sonographic Murphy's sign noted.  Common Bile Duct:  Within normal limits in caliber.  Liver: No focal mass lesion identified.  Within normal limits in parenchymal echogenicity.  IVC:  Appears normal.  Pancreas:  No abnormality identified.  Spleen:  Within normal limits in size and echotexture.  Right kidney:  Normal in  size and parenchymal echogenicity.  No evidence of mass or hydronephrosis.  Left kidney:  Normal in size and parenchymal echogenicity.  No evidence of mass or hydronephrosis.  Abdominal Aorta:  Small amount of ascites is noted.  Trace left pleural effusion.  IMPRESSION: Minimal sludge within the gallbladder with borderline wall thickening.  This could indicate cholecystitis in the appropriate clinical context.  Trace ascites.  Original Report Authenticated By: Arline Asp, M.D.   Results for CHIKEZIE, WITTMER (MRN [id number]) as of 03/14/2012 08:19  Ref. Range 03/12/2012 13:23  Hep A IgM Latest Range: NEGATIVE  NEGATIVE  Hepatitis B Surface Ag Latest Range: NEGATIVE  NEGATIVE  Hep B C IgM Latest Range: NEGATIVE  NEGATIVE  HCV Ab Latest Range: NEGATIVE  NEGATIVE  HIV Latest Range: NON REACTIVE  NON REACTIVE   GGT 99    Medications:  Scheduled:    . aspirin  81 mg Oral Daily  . cefTRIAXone (ROCEPHIN)  IV  2 g Intravenous Q24H  . enoxaparin  40 mg Subcutaneous Q24H  . insulin aspart  0-15 Units Subcutaneous TID WC  . insulin aspart  0-5 Units Subcutaneous QHS  . insulin glargine  30 Units Subcutaneous QHS  . living well with diabetes book   Does not apply Once  . mulitivitamin with minerals  1 tablet Oral Daily  . DISCONTD: insulin glargine  15 Units Subcutaneous QHS  . DISCONTD: insulin glargine  25 Units Subcutaneous QHS   Continuous:  [id number], acetaminophen, guaiFENesin, hydrALAZINE, ondansetron (ZOFRAN) IV, ondansetron, oxyCODONE, technetium TC 11M mebrofenin  Impression:  Active Problems:  Community acquired pneumonia  Diabetes mellitus  Acute kidney failure  Elevated LFTs  Bacteremia due to Gram-positive bacteria   Dense RLL Pna, now with min dark hemoptysis in sputum. Can not exclude malignancy.  continue Rocephin, Azith, Received 1 dose of vanc 3/21. WBC rising. Will consult Pulmonary 3/22.  Bacteremia: Grm + cocci in pairs in 2 blood cultures. On D2  Azith and Rocephin. Received one dose of Vanc and it was subsequently d/c'd due to bump in creatinine. ID consulted 3/22.  Acute renal failure/contrast-induced nephropathy: Cr 2.99<--1.10 on admit 3/20. IVF's, hold ACEI, D/C Vanc (only received 1 dose) U/A with protein uria. Not making urine overnight 3/21 - 3/22. Will consult Renal 3/22. Foley ordered. And had 600 cc of concentrated urine out Anemia - will order w/u  Acute hepatitis ? Cholecystitis: Possible cholecystitis on U/S. Surgery consulted for opinion. LFTs rising acutely. Possible etiologies: Cholecystitis, bacteremia  Hep panel, HIV negative .  DM2, uncontrolled prior to admission: Will change insulin regimen now that pt has rx drug coverage. A1C pending. Lantus titrating as needed up today again  (per DM Coordinator Rec) & SSI. Patient afraid to eat secondary to right upper quad pain and hemoptysis.  Chest pain, pleuritic: continue analgesics.  Hyperlipidemia: holding statin due to elevated LFTs.  Prophylaxis: on SQ lovenox for DVT prophylaxis         LOS: 3 days   Eunie Lawn, MD Pager: [phone] 03/14/2012, 2:58 PM

## 2012-03-15 DIAGNOSIS — E871 Hypo-osmolality and hyponatremia: Secondary | ICD-10-CM | POA: Diagnosis present

## 2012-03-15 DIAGNOSIS — R7881 Bacteremia: Secondary | ICD-10-CM | POA: Diagnosis present

## 2012-03-15 DIAGNOSIS — B953 Streptococcus pneumoniae as the cause of diseases classified elsewhere: Secondary | ICD-10-CM | POA: Diagnosis present

## 2012-03-15 LAB — CBC
HCT: 23.2 % — ABNORMAL LOW (ref 39.0–52.0)
Hemoglobin: 8 g/dL — ABNORMAL LOW (ref 13.0–17.0)
MCH: 29 pg (ref 26.0–34.0)
MCHC: 34.5 g/dL (ref 30.0–36.0)
MCV: 84.1 fL (ref 78.0–100.0)
Platelets: 384 10*3/uL (ref 150–400)
RBC: 2.76 MIL/uL — ABNORMAL LOW (ref 4.22–5.81)
RDW: 12.2 % (ref 11.5–15.5)
WBC: 33.2 10*3/uL — ABNORMAL HIGH (ref 4.0–10.5)

## 2012-03-15 LAB — BASIC METABOLIC PANEL
BUN: 56 mg/dL — ABNORMAL HIGH (ref 6–23)
CO2: 25 mEq/L (ref 19–32)
Calcium: 8.1 mg/dL — ABNORMAL LOW (ref 8.4–10.5)
Chloride: 97 mEq/L (ref 96–112)
Creatinine, Ser: 1.59 mg/dL — ABNORMAL HIGH (ref 0.50–1.35)
GFR calc Af Amer: 59 mL/min — ABNORMAL LOW (ref >90)
GFR calc non Af Amer: 51 mL/min — ABNORMAL LOW (ref >90)
Glucose, Bld: 226 mg/dL — ABNORMAL HIGH (ref 70–99)
Potassium: 3.2 mEq/L — ABNORMAL LOW (ref 3.5–5.1)
Sodium: 131 mEq/L — ABNORMAL LOW (ref 135–145)

## 2012-03-15 LAB — HEPATIC FUNCTION PANEL
ALT: 99 U/L — ABNORMAL HIGH (ref 0–53)
AST: 85 U/L — ABNORMAL HIGH (ref 0–37)
Albumin: 1.9 g/dL — ABNORMAL LOW (ref 3.5–5.2)
Alkaline Phosphatase: 612 U/L — ABNORMAL HIGH (ref 39–117)
Bilirubin, Direct: 0.6 mg/dL — ABNORMAL HIGH (ref 0.0–0.3)
Indirect Bilirubin: 0.7 mg/dL (ref 0.3–0.9)
Total Bilirubin: 1.3 mg/dL — ABNORMAL HIGH (ref 0.3–1.2)
Total Protein: 6.5 g/dL (ref 6.0–8.3)

## 2012-03-15 LAB — PROCALCITONIN: Procalcitonin: 5.75 ng/mL

## 2012-03-15 MED ORDER — INSULIN GLARGINE 100 UNIT/ML ~~LOC~~ SOLN
35.0000 [IU] | Freq: Every day | SUBCUTANEOUS | Status: DC
  Administered 2012-03-15: 35 [IU] via SUBCUTANEOUS

## 2012-03-15 MED ORDER — SODIUM CHLORIDE 0.9 % IJ SOLN
10.0000 mL | INTRAMUSCULAR | Status: DC | PRN
  Administered 2012-03-15 – 2012-03-16 (×2): 10 mL

## 2012-03-15 MED ORDER — POTASSIUM CHLORIDE CRYS ER 20 MEQ PO TBCR
40.0000 meq | EXTENDED_RELEASE_TABLET | Freq: Two times a day (BID) | ORAL | Status: AC
  Administered 2012-03-15 (×2): 40 meq via ORAL
  Filled 2012-03-15 (×2): qty 2

## 2012-03-15 MED ORDER — INSULIN ASPART 100 UNIT/ML ~~LOC~~ SOLN
6.0000 [IU] | Freq: Three times a day (TID) | SUBCUTANEOUS | Status: DC
  Administered 2012-03-15 – 2012-03-16 (×3): 6 [IU] via SUBCUTANEOUS

## 2012-03-15 NOTE — Progress Notes (Signed)
INFECTIOUS DISEASE PROGRESS NOTE    Date of Admission:  03/11/2012   Total days of antibiotics 4        Day 4 ceftriaxone        Azithromycin 3/20-22        Principal Problem:  *Bacteremia due to Streptococcus pneumoniae Active Problems:  Community acquired pneumonia  Diabetes mellitus  Acute kidney failure  Elevated LFTs      . aspirin  81 mg Oral Daily  . cefTRIAXone (ROCEPHIN)  IV  2 g Intravenous Q24H  . enoxaparin  40 mg Subcutaneous Q24H  . insulin aspart  0-15 Units Subcutaneous TID WC  . insulin aspart  0-5 Units Subcutaneous QHS  . insulin aspart  6 Units Subcutaneous TID WC  . insulin glargine  35 Units Subcutaneous QHS  . mulitivitamin with minerals  1 tablet Oral Daily  . potassium chloride  40 mEq Oral BID  . senna-docusate  1 tablet Oral BID  . DISCONTD: insulin glargine  30 Units Subcutaneous QHS    Subjective: Feels much better and wants to go home.   Objective: Temp:  [98 F (36.7 C)-98.5 F (36.9 C)] 98.1 F (36.7 C) (03/24 0451) Pulse Rate:  [95-105] 95  (03/24 0451) Resp:  [18-21] 20  (03/24 0451) BP: (154-169)/(78-96) 169/96 mmHg (03/24 0451) SpO2:  [92 %-98 %] 94 % (03/24 0451)  General: AAO x 3, nad Skin: no rashes Lungs: CTA B Cor: RRR without m/r/g  Lab Results Lab Results  Component Value Date   WBC 33.2* 03/15/2012   HGB 8.0* 03/15/2012   HCT 23.2* 03/15/2012   MCV 84.1 03/15/2012   PLT 384 03/15/2012    Lab Results  Component Value Date   CREATININE 1.59* 03/15/2012   BUN 56* 03/15/2012   NA 131* 03/15/2012   K 3.2* 03/15/2012   CL 97 03/15/2012   CO2 25 03/15/2012    Lab Results  Component Value Date   ALT 99* 03/15/2012   AST 85* 03/15/2012   ALKPHOS 612* 03/15/2012   BILITOT 1.3* 03/15/2012       Microbiology: Recent Results (from the past 240 hour(s))  CULTURE, BLOOD (ROUTINE X 2)     Status: Normal   Collection Time   03/11/12  1:00 PM      Component Value Range Status Comment   Specimen Description BLOOD RIGHT  ANTECUBITAL   Final    Special Requests NONE BOTTLES DRAWN AEROBIC AND ANAEROBIC [facility]   Final    Culture  Setup Time [id number]   Final    Culture     Final    Value: STREPTOCOCCUS PNEUMONIAE     Note: SUSCEPTIBILITIES PERFORMED ON PREVIOUS CULTURE WITHIN THE LAST 5 DAYS.     Note: Gram Stain Report Called to,Read Back By and Verified With: COURTNEY DRIGGERS @ [id number] 03/12/12 WICKN   Report Status 03/14/2012 FINAL   Final   CULTURE, BLOOD (ROUTINE X 2)     Status: Normal   Collection Time   03/11/12  2:10 PM      Component Value Range Status Comment   Specimen Description BLOOD RIGHT HAND   Final    Special Requests NONE BOTTLES DRAWN AEROBIC AND ANAEROBIC [facility]   Final    Culture  Setup Time [id number]   Final    Culture     Final    Value: GRAM POSITIVE COCCI IN PAIRS     STREPTOCOCCUS PNEUMONIAE     Note: Gram Stain Report Called  to,Read Back By and Verified With: COURTNEY DRIGGERS @ 1035 03/12/12 WICKN   Report Status 03/14/2012 FINAL   Final    Organism ID, Bacteria STREPTOCOCCUS PNEUMONIAE   Final     Studies/Results: Nm Hepatobiliary  03/14/2012  *RADIOLOGY REPORT*  Clinical Data: Abdominal pain, evaluate for cholecystitis  NUCLEAR MEDICINE HEPATOHBILIARY INCLUDE GB  Radiopharmaceutical:  5.5 mCi technetium 61m Choletec  Comparison: Ultrasound dated 03/12/2012.  Findings: Normal hepatic uptake.  Small bowel is visible within 20 minutes.  Gallbladder is visible within 30 minutes, confirming cystic duct patency.  IMPRESSION: Normal hepatobiliary nuclear medicine scan.  Original Report Authenticated By: Julian Hy, M.D.     Assessment: 46 yo with pneumococcal pneumonia with bacteremia.  WBC remains elevated, patient is afebrile.  Plan: 1. Continue with ceftriaxone for 2 weeks (through April 2nd) and reevaluate since his WBC remains quit elevated.  I will arrange follow up with ID clinic on or around Monday the 1st of April.  -have home health get a CBC with diff and  CMP Friday the 29th of March with results to me.    Scharlene Gloss, MD

## 2012-03-15 NOTE — Progress Notes (Signed)
Patient ID: [name] [name], male   DOB: [birth year], 46 y.o.   MRN: [id number]   [facility] Progress Note    Subjective:   Reports to be feeling better and inquires about DC home   Objective:   BP 169/96  Pulse 95  Temp(Src) 98.1 F (36.7 C) (Oral)  Resp 20  Ht 6' 0.2" (1.834 m)  Wt 84.2 kg (185 lb 10 oz)  BMI 25.04 kg/m2  SpO2 94%  Intake/Output Summary (Last 24 hours) at 03/15/12 1017 Last data filed at 03/15/12 0900  Gross per 24 hour  Intake    820 ml  Output   3900 ml  Net  -3080 ml   Weight change:   Physical Exam: [id number] sitting up in recliner  [id number] RRR, normal S1 and S2  Resp:Coarse rales bilaterally, no wheeze [id number], flat, NT, BS normal Ext:No LE edema  Imaging: Nm Hepatobiliary  03/14/2012  *RADIOLOGY REPORT*  Clinical Data: Abdominal pain, evaluate for cholecystitis  NUCLEAR MEDICINE HEPATOHBILIARY INCLUDE GB  Radiopharmaceutical:  5.5 mCi technetium 11m Choletec  Comparison: Ultrasound dated 03/12/2012.  Findings: Normal hepatic uptake.  Small bowel is visible within 20 minutes.  Gallbladder is visible within 30 minutes, confirming cystic duct patency.  IMPRESSION: Normal hepatobiliary nuclear medicine scan.  Original Report Authenticated By: Julian Hy, M.D.    Labs: BMET  Lab 03/15/12 [id number] 03/14/12 0650 03/13/12 0642 03/12/12 0940 03/11/12 1300  NA 131* 127* 125* 127* 125*  K 3.2* 3.5 4.0 3.9 4.2  CL 97 94* 91* 89* 87*  CO2 25 21 20 21 27   GLUCOSE 226* 256* 355* 371* 358*  BUN 56* 83* 74* 50* 29*  CREATININE 1.59* 2.86* 2.99* 2.00* 1.10  ALB -- -- -- -- --  CALCIUM 8.1* 8.0* 7.9* 8.1* 8.3*  PHOS -- -- -- -- --   CBC  Lab 03/15/12 0937 03/14/12 0650 03/13/12 0642 03/12/12 0940  WBC 33.2* 29.4* 21.8* 14.8*  NEUTROABS -- -- -- --  HGB 8.0* 7.8* 8.4* 8.9*  HCT 23.2* 23.0* 25.0* 26.0*  MCV 84.1 83.0 84.7 83.3  PLT 384 291 243 233    Medications:      . aspirin  81 mg Oral Daily  . cefTRIAXone  (ROCEPHIN)  IV  2 g Intravenous Q24H  . enoxaparin  40 mg Subcutaneous Q24H  . insulin aspart  0-15 Units Subcutaneous TID WC  . insulin aspart  0-5 Units Subcutaneous QHS  . insulin aspart  6 Units Subcutaneous TID WC  . insulin glargine  35 Units Subcutaneous QHS  . mulitivitamin with minerals  1 tablet Oral Daily  . potassium chloride  40 mEq Oral BID  . senna-docusate  1 tablet Oral BID  . DISCONTD: insulin glargine  30 Units Subcutaneous QHS     Assessment/ Plan:   1. ARI- Possibly due to CINP in the setting of volume depletion. Patient likely has some underlying renal dysfunction in the setting of long history of diabetes. Abd US done and showed Normal kidney size and parenchymal echogenicity bilaterally with no evidence of mass or hydronephrosis. Renal function improving with good UOP and appears to be on the path to renal recovery. 2. Pneumonia- bilateral pneumonia per CTA on 3-20. Pt's pleuritic rt sided chest pain improving. Pt on Rocephin and Azithro. Pt did note some hemoptysis. Pulmonary consulted.  3. Bacteremia: Grm + cocci in pairs in 2 blood cultures. Streptococcus species identified. On D2 Azith and Rocephin. Received one dose of Vanc and it was subsequently  d/c'd due to bump in creatinine. ID consulted 3/22.  4.Acute hepatitis/Cholecystitis: Possible cholecystitis on U/S. Surgery consulted and states most likely due to RLL PNA but HIDA scan ordered. Marland Kitchen LFTs rising acutely. GGT, Acute Hep Panel and HIV negative.  5. Hyponatremia- Improving as renal function/water handling improves.- monitor.  6. Anemia- HGB down from 8.4 to 7.8 yesterday . Patient may have underlying AOCD.  7. DM2, uncontrolled prior to admission: per primary 8. Hypokalemia: give PO potassium  Will sign off for now and arrange for him to have an out-patient follow up with me in renal clinic (this will be done Monday and appt card mailed to the patient). Please call if further assistance required in  management.  Elmarie Shiley, MD 03/15/2012, 10:17 AM

## 2012-03-15 NOTE — Progress Notes (Signed)
Pt requested that he not be disturbed during the night with employees checking in on him.  A sign put on the door to check at the nurse's station prior to entering his room.  Pt asleep. [name] [name]

## 2012-03-15 NOTE — Progress Notes (Signed)
Discontinued droplet isolation as pt does not have fever with cough.  Do not see original order for isolation.  Instructed pt of reason to d/c isolation.  Rosalie Gums RN

## 2012-03-15 NOTE — Progress Notes (Signed)
Subjective:  Feeling great  Wants to go home    Physical Exam: Blood pressure 169/96, pulse 95, temperature 98.1 F (36.7 C), temperature source Oral, resp. rate 20, height 6' 0.2" (1.834 m), weight 84.2 kg (185 lb 10 oz), SpO2 94.00%. Patient Vitals for the past 24 hrs:  BP Temp Temp src Pulse Resp SpO2 Weight  03/15/12 0451 169/96 mmHg 98.1 F (36.7 C) Oral 95  20  94 % -  03/14/12 2145 154/78 mmHg 98 F (36.7 C) Oral 105  18  98 % -  03/14/12 1421 158/86 mmHg 98.5 F (36.9 C) Oral 104  21  92 % -   Alert and oriented x3 Cvs: rrr Rs: crackles right base    Intake/Output Summary (Last 24 hours) at 03/15/12 1401 Last data filed at 03/15/12 0900  Gross per 24 hour  Intake    720 ml  Output   3900 ml  Net  -3180 ml   CBG (last 3)   Basename 03/14/12 1718 03/14/12 1159 03/14/12 0810  GLUCAP 318* 244* 248*     Investigations:  Recent Results (from the past 240 hour(s))  CULTURE, BLOOD (ROUTINE X 2)     Status: Normal   Collection Time   03/11/12  1:00 PM      Component Value Range Status Comment   Specimen Description BLOOD RIGHT ANTECUBITAL   Final    Special Requests NONE BOTTLES DRAWN AEROBIC AND ANAEROBIC [facility]   Final    Culture  Setup Time [id number]   Final    Culture     Final    Value: STREPTOCOCCUS PNEUMONIAE     Note: SUSCEPTIBILITIES PERFORMED ON PREVIOUS CULTURE WITHIN THE LAST 5 DAYS.     Note: Gram Stain Report Called to,Read Back By and Verified With: COURTNEY DRIGGERS @ [id number] 03/12/12 WICKN   Report Status 03/14/2012 FINAL   Final   CULTURE, BLOOD (ROUTINE X 2)     Status: Normal   Collection Time   03/11/12  2:10 PM      Component Value Range Status Comment   Specimen Description BLOOD RIGHT HAND   Final    Special Requests NONE BOTTLES DRAWN AEROBIC AND ANAEROBIC [facility] EACH   Final    Culture  Setup Time [id number]   Final    Culture     Final    Value: GRAM POSITIVE COCCI IN PAIRS     STREPTOCOCCUS PNEUMONIAE     Note: Gram Stain Report  Called to,Read Back By and Verified With: COURTNEY DRIGGERS @ [id number] 03/12/12 WICKN   Report Status 03/14/2012 FINAL   Final    Organism ID, Bacteria STREPTOCOCCUS PNEUMONIAE   Final      Basic Metabolic Panel:  Basename 03/15/12 0610 03/14/12 0650  NA 131* 127*  K 3.2* 3.5  CL 97 94*  CO2 25 21  GLUCOSE 226* 256*  BUN 56* 83*  CREATININE 1.59* 2.86*  CALCIUM 8.1* 8.0*  MG -- --  PHOS -- --   Liver Function Tests:  [facility] 03/15/12 0937 03/14/12 0650  AST 85* 140*  ALT 99* 105*  ALKPHOS 612* 586*  BILITOT 1.3* 1.6*  PROT 6.5 6.2  ALBUMIN 1.9* 1.6*     CBC:  Basename 03/15/12 0937 03/14/12 0650  WBC 33.2* 29.4*  NEUTROABS -- --  HGB 8.0* 7.8*  HCT 23.2* 23.0*  MCV 84.1 83.0  PLT 384 291    Nm Hepatobiliary  03/14/2012  *RADIOLOGY REPORT*  Clinical Data: Abdominal pain, evaluate  for cholecystitis  NUCLEAR MEDICINE HEPATOHBILIARY INCLUDE GB  Radiopharmaceutical:  5.5 mCi technetium 75m Choletec  Comparison: Ultrasound dated 03/12/2012.  Findings: Normal hepatic uptake.  Small bowel is visible within 20 minutes.  Gallbladder is visible within 30 minutes, confirming cystic duct patency.  IMPRESSION: Normal hepatobiliary nuclear medicine scan.  Original Report Authenticated By: Julian Hy, M.D.   Results for [name] [name], [name] [name] (MRN [id number]) as of 03/14/2012 08:19  Ref. Range 03/12/2012 13:23  Hep A IgM Latest Range: NEGATIVE  NEGATIVE  Hepatitis B Surface Ag Latest Range: NEGATIVE  NEGATIVE  Hep B C IgM Latest Range: NEGATIVE  NEGATIVE  HCV Ab Latest Range: NEGATIVE  NEGATIVE  HIV Latest Range: NON REACTIVE  NON REACTIVE   GGT 99    Medications:  Scheduled:    . aspirin  81 mg Oral Daily  . cefTRIAXone (ROCEPHIN)  IV  2 g Intravenous Q24H  . enoxaparin  40 mg Subcutaneous Q24H  . insulin aspart  0-15 Units Subcutaneous TID WC  . insulin aspart  0-5 Units Subcutaneous QHS  . insulin aspart  6 Units Subcutaneous TID WC  . insulin glargine  35 Units  Subcutaneous QHS  . mulitivitamin with minerals  1 tablet Oral Daily  . potassium chloride  40 mEq Oral BID  . senna-docusate  1 tablet Oral BID  . DISCONTD: insulin glargine  30 Units Subcutaneous QHS   Continuous:  [id number], acetaminophen, guaiFENesin, hydrALAZINE, ondansetron (ZOFRAN) IV, ondansetron, oxyCODONE  Impression:  Principal Problem:  *Bacteremia due to Streptococcus pneumoniae Active Problems:  Community acquired pneumonia  Diabetes mellitus  Acute kidney failure  Elevated LFTs   Dense RLL Pna, now with min dark hemoptysis in sputum. Due to Strep pneumo continue Rocephin, 2g/daily Received 1 dose of vanc 3/21. WBC rising despite correct treatment. May need 2 d echo /TEE  Strep Pneumo Bacteremia:  ID consulted 3/22.   Acute renal failure/contrast-induced nephropathy: Cr 2.99<--1.10 on admit 3/20. IVF's, hold ACEI, D/C Vanc (only received 1 dose) U/A with protein uria. Not making urine overnight 3/21 - 3/22. Will consult Renal 3/22. Foley ordered. And had 600 cc of concentrated urine out. Improving - UO - OK  May have CKD due to DM as underlying disease   Anemia - probably due to acute illness  Acute hepatitis ? Cholecystitis: Possible cholecystitis on U/S. Surgery consulted for opinion. LFTs rising acutely. Possible etiologies: probably due to bacteremia  Hep panel, HIV negative, HIDA scan WNL .  DM2, uncontrolled prior to admission: Will change insulin regimen now that pt has rx drug coverage. A1C pending. Lantus titrating up as needed up today again  (per DM Coordinator Rec) & SSI. Add novolog with meals   Chest pain, pleuritic: continue analgesics.  Hyperlipidemia: holding statin due to elevated LFTs.  Prophylaxis: on SQ lovenox for DVT prophylaxis         LOS: 4 days   Ayyan Sites, MD Pager: [phone] 03/15/2012, 2:01 PM

## 2012-03-16 ENCOUNTER — Inpatient Hospital Stay (HOSPITAL_COMMUNITY): Payer: Managed Care, Other (non HMO)

## 2012-03-16 DIAGNOSIS — R634 Abnormal weight loss: Secondary | ICD-10-CM

## 2012-03-16 DIAGNOSIS — J90 Pleural effusion, not elsewhere classified: Secondary | ICD-10-CM

## 2012-03-16 DIAGNOSIS — J154 Pneumonia due to other streptococci: Secondary | ICD-10-CM

## 2012-03-16 LAB — COMPREHENSIVE METABOLIC PANEL
ALT: 78 U/L — ABNORMAL HIGH (ref 0–53)
AST: 60 U/L — ABNORMAL HIGH (ref 0–37)
Albumin: 1.7 g/dL — ABNORMAL LOW (ref 3.5–5.2)
Alkaline Phosphatase: 545 U/L — ABNORMAL HIGH (ref 39–117)
BUN: 39 mg/dL — ABNORMAL HIGH (ref 6–23)
CO2: 25 mEq/L (ref 19–32)
Calcium: 8.1 mg/dL — ABNORMAL LOW (ref 8.4–10.5)
Chloride: 97 mEq/L (ref 96–112)
Creatinine, Ser: 1.15 mg/dL (ref 0.50–1.35)
GFR calc Af Amer: 87 mL/min — ABNORMAL LOW (ref >90)
GFR calc non Af Amer: 75 mL/min — ABNORMAL LOW (ref >90)
Glucose, Bld: 313 mg/dL — ABNORMAL HIGH (ref 70–99)
Potassium: 4.3 mEq/L (ref 3.5–5.1)
Sodium: 132 mEq/L — ABNORMAL LOW (ref 135–145)
Total Bilirubin: 1 mg/dL (ref 0.3–1.2)
Total Protein: 6.2 g/dL (ref 6.0–8.3)

## 2012-03-16 LAB — HEPATITIS PANEL, ACUTE
HCV Ab: NEGATIVE
Hep A IgM: NEGATIVE
Hep B C IgM: NEGATIVE
Hepatitis B Surface Ag: NEGATIVE

## 2012-03-16 LAB — CBC
HCT: 21.2 % — ABNORMAL LOW (ref 39.0–52.0)
Hemoglobin: 7 g/dL — ABNORMAL LOW (ref 13.0–17.0)
MCH: 28.7 pg (ref 26.0–34.0)
MCHC: 33 g/dL (ref 30.0–36.0)
MCV: 86.9 fL (ref 78.0–100.0)
Platelets: 415 10*3/uL — ABNORMAL HIGH (ref 150–400)
RBC: 2.44 MIL/uL — ABNORMAL LOW (ref 4.22–5.81)
RDW: 12.7 % (ref 11.5–15.5)
WBC: 35.1 10*3/uL — ABNORMAL HIGH (ref 4.0–10.5)

## 2012-03-16 LAB — GLUCOSE, CAPILLARY
Glucose-Capillary: 277 mg/dL — ABNORMAL HIGH (ref 70–99)
Glucose-Capillary: 213 mg/dL — ABNORMAL HIGH (ref 70–99)
Glucose-Capillary: 281 mg/dL — ABNORMAL HIGH (ref 70–99)
Glucose-Capillary: 249 mg/dL — ABNORMAL HIGH (ref 70–99)
Glucose-Capillary: 284 mg/dL — ABNORMAL HIGH (ref 70–99)
Glucose-Capillary: 303 mg/dL — ABNORMAL HIGH (ref 70–99)
Glucose-Capillary: 292 mg/dL — ABNORMAL HIGH (ref 70–99)

## 2012-03-16 LAB — MRSA PCR SCREENING: MRSA by PCR: NEGATIVE

## 2012-03-16 LAB — PROCALCITONIN: Procalcitonin: 2.52 ng/mL

## 2012-03-16 MED ORDER — INSULIN ASPART 100 UNIT/ML ~~LOC~~ SOLN: 2.0000 [IU] | Freq: Three times a day (TID) | SUBCUTANEOUS | Status: DC

## 2012-03-16 MED ORDER — HEPARIN SOD (PORK) LOCK FLUSH 100 UNIT/ML IV SOLN
250.0000 [IU] | INTRAVENOUS | Status: AC | PRN
  Administered 2012-03-16: 500 [IU]

## 2012-03-16 MED ORDER — INSULIN GLARGINE 100 UNIT/ML ~~LOC~~ SOLN: 40.0000 [IU] | Freq: Every day | SUBCUTANEOUS | Status: DC

## 2012-03-16 MED ORDER — INSULIN ASPART 100 UNIT/ML ~~LOC~~ SOLN: 11.0000 [IU] | Freq: Three times a day (TID) | SUBCUTANEOUS | Status: DC

## 2012-03-16 MED ORDER — DEXTROSE 5 % IV SOLN: 2.0000 g | INTRAVENOUS | Status: AC

## 2012-03-16 NOTE — Discharge Summary (Signed)
Patient ID: [name] [name] MRN: [id number] DOB/AGE: Feb 01, 1966 46 y.o.  Admit date: 03/11/2012 Discharge date: 03/16/2012  Primary Care Physician:  Dr. Scarlette Calico Infectious Disease:  Dr. Scharlene Gloss Pulmonary:  Dr. Baltazar Apo  Discharge Diagnoses:   *Bacteremia due to Streptococcus pneumoniae  Present on Admission:  Principal Problem:  *Bacteremia due to Streptococcus pneumoniae Active Problems:  Bilateral Community acquired pneumonia  Acute kidney failure - contrast nephropathy, urinary retention, and a component of CKD from DM.  Elevated LFTs  Diabetes mellitus  Hyponatremia Mal-Nourished state   Medication List  As of 03/16/2012 12:12 PM   STOP taking these medications         azithromycin 250 MG tablet      insulin NPH 100 UNIT/ML injection      insulin regular 100 units/mL injection      oseltamivir 75 MG capsule         TAKE these medications         aspirin 81 MG tablet   Take 81 mg by mouth daily.      dextrose 5 % SOLN 50 mL with cefTRIAXone 2 G SOLR 2 g   Inject 2 g into the vein daily.      insulin aspart 100 UNIT/ML injection   Commonly known as: novoLOG   Inject 11 Units into the skin 3 (three) times daily before meals.      insulin aspart 100 UNIT/ML injection   Commonly known as: novoLOG   Inject 2 Units into the skin 3 (three) times daily before meals. Sliding Scale in addition to 11 units with meals    CBG 70 - 120: 0 units  CBG 121 - 150: 2 units  CBG 151 - 200: 3 units  CBG 201 - 250: 5 units  CBG 251 - 300: 8 units  CBG 301 - 350: 11 units  CBG 351 - 400: 15 units      insulin glargine 100 UNIT/ML injection   Commonly known as: LANTUS   Inject 40 Units into the skin at bedtime.      lisinopril 5 MG tablet   Commonly known as: PRINIVIL,ZESTRIL   Take 5 mg by mouth daily.      multivitamin capsule   Take 1 capsule by mouth daily.      rosuvastatin 10 MG tablet   Commonly known as: CRESTOR   Take 10 mg by  mouth daily.            Consults: 1.  [facility]  2.  [facility]  3.  Infectious Disease  4.  Renal  Brief H and P: 46 year old male with known history of diabetes mellitus 2 had not been feeling well since Sunday ( 4 days ago prior to admit) and had gone to urgent care and was told he had flu and was started on Tamiflu on Sunday. Following which he started developing some nausea and start of a right-sided chest pain on deep breathing. Since it was persistent he went to his PCP yesterday and was referred to the ER at [facility]. At the ER patient had CT angiogram of the chest which showed bilateral pneumonia. Patient has been admitted for further management. Patient at this time denies any nausea vomiting abdominal pain or any dysuria discharges or diarrhea.  1.  Bacteremia from Streptococcus Pneumoniae with Bilateral community acquired PNA.  The patient was started empirically on Rocephin and Zithromax, he received his last day of Tamiflu.  24 -  36 hours into his hospitalization his labs were significantly worse and the patient was feeling poorly and began having hemoptysis.  Pulmonary consultation and Infectious disease consultation were both called.  Blood cultures revealed Strep Pneumo.  HIV was checked and found to be non-reactive.  ID recommended Rocephin 2g IV per day thru 4/2, and the patient will follow up with Dr. Linus Salmons in the clinic around 4/1 (Dr. Linus Salmons will confirm the appointment.)  CT Angio of the chest on 3/20 ruled out PE and demonstrated consolidative PNA thru throughout the RLL with partial involvement of the LLL.  Pulmonary recommends that the patient follow up with Dr. Lamonte Sakai and have a repeat CT chest in 6 weeks to ensure resolution.  The patient was discharged to home with PICC line in place for antibiotics thru 4/2.  2.  Diabetes Mellitus.  Hgb A1c of 15.0  The patient maintained cbgs in the 200 - 300 range even after titrating up Insulin.  He  was discharged to home with diabetic education and prescriptions for Novolog and Lantus Pens.  3.  Acute Kidney Failure.  Due to a combination of contrast nephropathy, urinary retention and likely an element of chronic kidney disease from DM II.  The renal service saw the patient in consultation.  Abd US done and showed Normal kidney size and parenchymal echogenicity bilaterally with no evidence of mass or hydronephrosis.  After the CT scan the patient developed urinary retention, this resolved with Foley catheter placement. Once the patient was urinating well the Foley catheter was removed, and his creatinine normalized with IV fluids.  4.  Acute hepatitis/Cholecystitis with elevated LFTs.  Possible cholecystitis on U/S. CC Surgery consulted and stated elevated LFTs were most likely due to bacteremia.   HIDA scan ordered and found to be negative for cholecystitis or cystic duct blockage. LFTs slowly trending down. GGT,  and HIV negative.    5.  Acute drop in hemoglobin in the setting of Chronic Anemia.  On admission the patient had a hemoglobin of 10 and an MCV value of 80.9.  Over the course of his hospital stay he did have a minimal amount of hemoptysis, however there was no frank blood loss, melena or hematochezia. At the time of discharge his hemoglobin is 7.0 with an MCV value of 86.9. The acute portion of his anemia is felt to be due to acute illness rather than bleeding.   Physical Exam on Discharge: General: Alert, awake, oriented x3, in no acute distress. Standing walking about the room. Heart: Regular rate and rhythm, without murmurs, rubs, gallops. Lungs: Clear to auscultation bilaterally. Abdomen: Soft, nontender, nondistended, positive bowel sounds. Extremities: No clubbing cyanosis or edema Neuro: Grossly intact, nonfocal.  Filed Vitals:   03/15/12 0451 03/15/12 1500 03/15/12 2121 03/16/12 0602  BP: 169/96 154/85 144/84 156/96  Pulse: 95 100 98 90  Temp: 98.1 F (36.7 C) 97.9 F  (36.6 C) 98.3 F (36.8 C) 98 F (36.7 C)  TempSrc: Oral Oral Oral Oral  Resp: 20 18 20 19   Height:      Weight:      SpO2: 94% 96% 94% 91%     Intake/Output Summary (Last 24 hours) at 03/16/12 1212 Last data filed at 03/16/12 0900  Gross per 24 hour  Intake    838 ml  Output      0 ml  Net    838 ml    Basic Metabolic Panel:  Lab [id number] 0545 03/15/12 0610  NA 132* 131*  K 4.3 3.2*  CL 97 97  CO2 25 25  GLUCOSE 313* 226*  BUN 39* 56*  CREATININE 1.15 1.59*  CALCIUM 8.1* 8.1*  MG -- --  PHOS -- --   Liver Function Tests:  Lab 03/16/12 0545 03/15/12 0937  AST 60* 85*  ALT 78* 99*  ALKPHOS 545* 612*  BILITOT 1.0 1.3*  PROT 6.2 6.5  ALBUMIN 1.7* 1.9*    Lab 03/12/12 1322  AMMONIA 22   CBC:  Lab 03/16/12 0545 03/15/12 0937  WBC 35.1* 33.2*  NEUTROABS -- --  HGB 7.0* 8.0*  HCT 21.2* 23.2*  MCV 86.9 84.1  PLT 415* 384   CBG:  Lab 03/14/12 1718 03/14/12 1159 03/14/12 0810 03/13/12 2107 03/13/12 1706 03/13/12 1121  GLUCAP 318* 244* 248* 258* 335* 360*   Hemoglobin A1C:  Lab 03/12/12 0940  HGBA1C 15.7*   Coagulation:  Lab 03/13/12 0810 03/12/12 1323  LABPROT 13.9 14.6  INR 1.05 1.12    Micro Results:  03/11/12 Specimen Description BLOOD RIGHT HAND Special Requests NONE BOTTLES DRAWN AEROBIC AND ANAEROBIC 5CC EACH Culture Setup Time [id number] Culture GRAM POSITIVE COCCI IN PAIRS; STREPTOCOCCUS PNEUMONIAE  Other Pertinent Lab Results:   Results for [name] [name], [name] [name] (MRN [id number]) as of 03/16/2012 15:26  Ref. Range 03/12/2012 13:23 03/13/2012 11:44  Hep A IgM Latest Range: NEGATIVE  NEGATIVE PENDING  Hepatitis B Surface Ag Latest Range: NEGATIVE  NEGATIVE NEGATIVE  Hep B C IgM Latest Range: NEGATIVE  NEGATIVE PENDING  HCV Ab Latest Range: NEGATIVE  NEGATIVE NEGATIVE  HIV Latest Range: NON REACTIVE  NON REACTIVE      Significant Diagnostic Studies:  Dg Chest 2 View  03/11/2012  *RADIOLOGY REPORT*  Clinical Data: Shortness of breath  with right lower chest pain and fever.  CHEST - 2 VIEW  Comparison: None.  Findings: Trachea is midline.  Heart size normal.  There is dense airspace consolidation in the right lower lobe.  Question mild left lower lobe air space disease.  No left pleural fluid.  IMPRESSION:  1.  Dense airspace consolidation in the right lower lobe is most consistent with pneumonia.  Follow-up to clearing is recommended, to exclude a centrally obstructing mass. 2.  Question mild left lower lobe air space disease.  Original Report Authenticated By: Luretha Rued, M.D.   Ct Angio Chest W/cm &/or Wo Cm  03/11/2012  *RADIOLOGY REPORT*  Clinical Data: Fever.  Cough.  Weakness.  Short of breath.  CT ANGIOGRAPHY CHEST  Technique:  Multidetector CT imaging of the chest using the standard protocol during bolus administration of intravenous contrast. Multiplanar reconstructed images including MIPs were obtained and reviewed to evaluate the vascular anatomy.  Contrast: 80mL OMNIPAQUE IOHEXOL 350 MG/ML IV SOLN  Comparison: Chest radiography same day  Findings: Pulmonary arterial opacification is excellent.  There are no pulmonary emboli.  There is consolidative pneumonia throughout the right lower lobe with minor involvement of the right upper lobe.  There is consolidative pneumonia less extensively in the posterior aspect of the left lower lobe.  There is a small amount of pleural fluid on the right.  There is a tiny amount of pericardial fluid.  There are small mediastinal lymph nodes, likely reactive.  No upper abdominal pathology is seen.  IMPRESSION: No pulmonary emboli.  Consolidative pneumonia throughout the right lower lobe.  Partial involvement of the left lower lobe by consolidative  pneumonia. Minimal involvement of the right upper lobe.  Original Report Authenticated By: Jules Schick, M.D.  Nm Hepatobiliary  03/14/2012  *RADIOLOGY REPORT*  Clinical Data: Abdominal pain, evaluate for cholecystitis  NUCLEAR MEDICINE  HEPATOHBILIARY INCLUDE GB  Radiopharmaceutical:  5.5 mCi technetium 73m Choletec  Comparison: Ultrasound dated 03/12/2012.  Findings: Normal hepatic uptake.  Small bowel is visible within 20 minutes.  Gallbladder is visible within 30 minutes, confirming cystic duct patency.  IMPRESSION: Normal hepatobiliary nuclear medicine scan.  Original Report Authenticated By: Julian Hy, M.D.   US Abdomen Complete  03/12/2012  *RADIOLOGY REPORT*  Clinical Data:  Right upper quadrant abdominal pain, elevated liver function tests  ABDOMINAL ULTRASOUND COMPLETE  Comparison:  Chest CT 03/11/2012 with incomplete visualization of the liver  Findings:  Gallbladder:  Minimal dependent sludge is noted within the gallbladder.  The gallbladder is not distended, with gallbladder wall thickness measuring borderline thickened at 4 mm.  No shadowing calculus or sonographic Murphy's sign noted.  Common Bile Duct:  Within normal limits in caliber.  Liver: No focal mass lesion identified.  Within normal limits in parenchymal echogenicity.  IVC:  Appears normal.  Pancreas:  No abnormality identified.  Spleen:  Within normal limits in size and echotexture.  Right kidney:  Normal in size and parenchymal echogenicity.  No evidence of mass or hydronephrosis.  Left kidney:  Normal in size and parenchymal echogenicity.  No evidence of mass or hydronephrosis.  Abdominal Aorta:  Small amount of ascites is noted.  Trace left pleural effusion.  IMPRESSION: Minimal sludge within the gallbladder with borderline wall thickening.  This could indicate cholecystitis in the appropriate clinical context.  Trace ascites.  Original Report Authenticated By: Arline Asp, M.D.      Disposition and Follow-up:  Stable for discharge to home with Home Health RN and IV antibiotics.  Discharge Orders    Future Appointments: Provider: Department: Dept Phone: Center:   04/20/2012 10:00 AM [facility] [id number] [facility]   04/21/2012 10:30  AM Janith Lima, MD [facility] [phone] [facility]   04/24/2012 2:15 PM Collene Gobble, MD [facility] [phone] None     Future Orders Please Complete By Expires   Diet - low sodium heart healthy      Increase activity slowly      Discharge instructions      Comments:   Daily IV antibiotics per Home Health RN. If you develop fever > 101.0 or develop vomiting or diarrhea call Dr. Ronnald Ramp office immediately to be seen or return to the Emergency room.     Follow-up Information    Follow up with Collene Gobble., MD on 04/24/2012. (Appt at 2:15.  Arrive at 2 pm )    Contact information:   [address] [place] [phone]       Follow up with Scharlene Gloss, MD on 03/23/2012. (Call to confirm appointment time with Dr. Linus Salmons)    Contact information:   [address] [place] [phone]       Follow up with Scarlette Calico, MD on 03/21/2012. (10:30 with Dr. Ronnald Ramp)    Contact information:   [address] [facility] [place], [place] [phone]         And 1.  Patient to see Dr. Linus Salmons infectious disease for followup of his strep pneumo bacteremia. 2.  Please monitor anemia.  Patient had minimal hemoptysis in hospital.  Anemia is likely secondary to chronic disease. 3.  Please monitor LFTs to ensure normalization. 4.  Please focus on DM and medication dosing, as cbgs  were still in the 300s at the time of discharge.  Time spent on Discharge: 45 min.  Sig in aned: Melton Alar 03/16/2012, 12:12 PM [phone] I have personally seen and examined [name] [name]  I agree with PE/AP as per M/ [place], PA note I have personally participated in coordinating care during the discharge process  Kateryn Marasigan

## 2012-03-16 NOTE — Discharge Instructions (Signed)
[place] RN for IV abx arranged with [place]- [phone]

## 2012-03-16 NOTE — Progress Notes (Signed)
Utilization Review Completed.[name] [name] T3/25/2013   

## 2012-03-16 NOTE — Progress Notes (Addendum)
Name: [name] [name] MRN: [id number] DOB: 1966-04-18    LOS: 7   Pulmonary / Critical Care Note   History of Present Illness:  46 y/o M with PMH of DM (poorly controlled) admitted on 3/21 per [facility] with a month long history of feeling poorly. His daughter had recently been diagnosed with strep throat.  He was seen at an UC and told he had flu and Rx'd with Tamiflu. He continued to worsen and developed R sided chest pain with deep breathing. He was seen by his PCP and referred to ED where CTA of Chest demonstrated bilateral pneumonia R>L on 3/20.  PCCM consulted for pneumonia / hemoptysis 3/22 with pos blood cultures for strep pna.     Cultures: BCx2 3/20> streptococcus species> R penicillin only Sputum 3/22> ordered but not submitted  Antibiotics per ID Azithromycin (CAP) 3/20>>> Rocephin (CAP) 3/20> 3/21   Tests / Events: CTA CHEST 3/20>>>No pulmonary emboli. Consolidative pneumonia throughout the right lower lobe. Partial involvement of the left lower lobe by consolidative pneumonia. Minimal involvement of the right upper lobe.  3/21 HIV>non-reactive   Subjective: Cough better but c/o  bilateral cp with coughing L > R  Vital Signs: Filed Vitals:   03/15/12 0451 03/15/12 1500 03/15/12 2121 03/16/12 0602  BP: 169/96 154/85 144/84 156/96  Pulse: 95 100 98 90  Temp: 98.1 F (36.7 C) 97.9 F (36.6 C) 98.3 F (36.8 C) 98 F (36.7 C)  TempSrc: Oral Oral Oral Oral  Resp: 20 18 20 19   Height:      Weight:      SpO2: 94% 96% 94% 91%   On Room air   Intake/Output Summary (Last 24 hours) at 03/16/12 [id number] Last data filed at 03/16/12 0300  Gross per 24 hour  Intake    720 ml  Output      0 ml  Net    720 ml    Physical Examination: General: chronically ill in NAD sitting in chair Neuro: AAOx4, speech clear, MAE CV: s1s2 rrr, no m/r/g PULM: resp's even/non-labored, posterior lower crackles on R GI: flat / soft, bsx4 active, tolerating  PO's Extremities: warm/dry, no edema.  Noted acanthos nigricans on back of neck.     Labs    CBC  Lab 03/16/12 0545 03/15/12 0937 03/14/12 0650  HGB 7.0* 8.0* 7.8*  HCT 21.2* 23.2* 23.0*  WBC 35.1* 33.2* 29.4*  PLT 415* 384 291     BMET  Lab 03/16/12 0545 03/15/12 0610 03/14/12 0650 03/13/12 0642 03/12/12 0940  NA 132* 131* 127* 125* 127*  K 4.3 3.2* -- -- --  CL 97 97 94* 91* 89*  CO2 25 25 21 20 21   GLUCOSE 313* 226* 256* 355* 371*  BUN 39* 56* 83* 74* 50*  CREATININE 1.15 1.59* 2.86* 2.99* 2.00*  CALCIUM 8.1* 8.1* 8.0* 7.9* 8.1*  MG -- -- -- -- --  PHOS -- -- -- -- --    Lab 03/13/12 0810 03/12/12 1323  INR 1.05 1.12      Assessment and Plan:  Community acquired pneumonia Assessment: CT with consolidative pneumonia throughout RLL, partial involvement of RUL, LLL.  Blood cultures positive   strepto  PNA.  HIV negative.   Weight loss and temporal wasting concerning for other underlying process.   Plan: -step pneumo rx per ID -f/u cxr PRN while inpatient -follow up as outpatient to ensure resolution with CT in 6 weeks with Dr. Lamonte Sakai     Other Medical Problems below  per [facility] Diabetes mellitus - apparent poor control of DM.    Acute kidney failure on ACEI prior to admit Elevated LFTs   Add:   cxr 3/25 reviewed > much better aeration, no significant effusions, f/u with cxr as outpt in 4-6 weeks > [place]    Christinia Gully, MD Pulmonary and [place] [phone]

## 2012-03-16 NOTE — Progress Notes (Signed)
   CARE MANAGEMENT NOTE 03/16/2012  Patient:  [name] [name]   Account Number:  [id number]  Date Initiated:  03/12/2012  Documentation initiated by:  [name] [name]  Subjective/Objective Assessment:   dx pna  admit as observation     Action/Plan:   Anticipated DC Date:  03/16/2012   Anticipated DC Plan:  [place]  CM consult      [facility] Choice  HOME HEALTH   Choice offered to / List presented to:  C-1 Patient        [place] arranged  HH-1 RN      [place].   Status of service:  Completed, signed off Medicare Important Message given?   (If response is "NO", the following Medicare IM given date fields will be blank) Date Medicare IM given:   Date Additional Medicare IM given:    Discharge Disposition:  [place]  Per UR Regulation:  Reviewed for med. necessity/level of care/duration of stay  If discussed at [place] of Stay Meetings, dates discussed:    Comments:  PCP- [place]  03/16/12- 1155- [name] Gibbons RN, BSN [phone] Pt for d/c today with HH-RN for IV abx, spoke with pt at bedside regarding [place] needs- list of agencies for [facility] given to pt- per pt choice referral sent to [facility] via [facility] for [facility]. Also spoke with [name] [name] with [facility] regarding referral and d/c today. Per conversation with pt- has transportation home, and gets his medications at [facility].  03/13/12 14;42 [name] Bamberger RN, BSN [phone] patient has surgery consult for poss chole, pulmonary following (coughing up blood), kidneys and liver not doing so good.  NCM will continue to follow for dc needs.  03/12/12 17:04 [name] Bamberger RN, BSN [phone] patient lives with children, pta independent, NCM will continue to follow for dc needs.

## 2012-03-16 NOTE — Progress Notes (Signed)
1400 Discharge instructions reviewed with patient verbalize and understand  Prescriptions given to patient. Skin WNL  Noted feet slightly swollen. Picc line intact per order for home antibiotic use.

## 2012-03-16 NOTE — Progress Notes (Signed)
INITIAL ADULT NUTRITION ASSESSMENT Date: 03/16/2012   Time: 10:36 AM Reason for Assessment: Consult, DM edu  ASSESSMENT: Male 46 y.o.  Dx: Bacteremia due to Streptococcus pneumoniae  Hx:  Past Medical History  Diagnosis Date  . Flu   . Diabetes mellitus     Related Meds:     . aspirin  81 mg Oral Daily  . cefTRIAXone (ROCEPHIN)  IV  2 g Intravenous Q24H  . enoxaparin  40 mg Subcutaneous Q24H  . insulin aspart  0-15 Units Subcutaneous TID WC  . insulin aspart  0-5 Units Subcutaneous QHS  . insulin aspart  6 Units Subcutaneous TID WC  . insulin glargine  35 Units Subcutaneous QHS  . mulitivitamin with minerals  1 tablet Oral Daily  . potassium chloride  40 mEq Oral BID  . senna-docusate  1 tablet Oral BID     Ht: 6' 0.2" (183.4 cm)  Wt: 185 lbs (84.1 kg)  Ideal Wt:80.9 kg % Ideal Wt: 103%  Usual Wt: 205 lbs, (93.2 kg) % Usual Wt: 90%  Body mass index is 25.04 kg/(m^2). Overweight  Food/Nutrition Related Hx: reports weight loss recently, 6 months, no trying.   Labs:  CMP     Component Value Date/Time   NA 132* 03/16/2012 0545   K 4.3 03/16/2012 0545   CL 97 03/16/2012 0545   CO2 25 03/16/2012 0545   GLUCOSE 313* 03/16/2012 0545   BUN 39* 03/16/2012 0545   CREATININE 1.15 03/16/2012 0545   CALCIUM 8.1* 03/16/2012 0545   PROT 6.2 03/16/2012 0545   ALBUMIN 1.7* 03/16/2012 0545   AST 60* 03/16/2012 0545   ALT 78* 03/16/2012 0545   ALKPHOS 545* 03/16/2012 0545   BILITOT 1.0 03/16/2012 0545   GFRNONAA 75* 03/16/2012 0545   GFRAA 87* 03/16/2012 0545    CBG (last 3)   Basename 03/14/12 1718 03/14/12 1159 03/14/12 0810  GLUCAP 318* 244* 248*    Lab Results  Component Value Date   HGBA1C 15.7* 03/12/2012      Intake/Output Summary (Last 24 hours) at 03/16/12 1039 Last data filed at 03/16/12 0900  Gross per 24 hour  Intake    838 ml  Output      0 ml  Net    838 ml     Diet Order: Carb Control medium, 1600-2000 kcal  Supplements/Tube Feeding: none  IVF:   saline lock   Estimated Nutritional Needs:   Kcal: 2100-2300  Protein:  85-100 gm Fluid:  2.1-2.3 L  Patient has not been following a DM diet at home, has had difficulty financially with affording medications. Does not check BS very often. RD instructed patient on a carbohydrate controlled diet, how to count carbs and meal/snack planning since he works nights. Patient is willing to make changes and verbalized understanding of recommendations made to him. Pt was given DM choose a meal booklet and healthy eating for Dm booklet. Encouraged to notify staff if further questions arise.  Pt also reports he has been losing weight, unsure why. 20 lbs weight loss (9.6%) in 6 months. Unable to find a documented weight history. Unknown etiology of weight loss, social vs other cause?  NUTRITION DIAGNOSIS: Unintentional weight loss ([place]-3.2)  Status: Ongoing  RELATED TO: Etiology unknown  AS EVIDENCE BY: 9.6% weight loss in 6 months  MONITORING/EVALUATION(Goals): Goal: PO intake will meet >90% of estimated nutrition needs Monitor: PO intake, weight, labs, additional education needs  EDUCATION NEEDS: -Education needs addressed  INTERVENTION: 1. DM education complete  2. RD will continue to follow 3. Recommend patient attend DM outpatient classes   Dietitian [phone]  DOCUMENTATION CODES Per approved criteria  -Not Applicable    Orson Slick MARIE 03/16/2012, 10:36 AM

## 2012-03-18 ENCOUNTER — Other Ambulatory Visit: Payer: Managed Care, Other (non HMO)

## 2012-03-21 LAB — CULTURE, BLOOD (ROUTINE X 2)
Culture  Setup Time: 201303241823
Culture  Setup Time: 201303241823
Culture: NO GROWTH
Culture: NO GROWTH

## 2012-03-23 ENCOUNTER — Ambulatory Visit (INDEPENDENT_AMBULATORY_CARE_PROVIDER_SITE_OTHER): Payer: Managed Care, Other (non HMO) | Admitting: Internal Medicine

## 2012-03-23 ENCOUNTER — Encounter: Payer: Self-pay | Admitting: Internal Medicine

## 2012-03-23 VITALS — BP 116/74 | HR 103 | Temp 98.5°F | Ht 74.0 in | Wt 193.0 lb

## 2012-03-23 DIAGNOSIS — L729 Follicular cyst of the skin and subcutaneous tissue, unspecified: Secondary | ICD-10-CM

## 2012-03-23 DIAGNOSIS — R7881 Bacteremia: Secondary | ICD-10-CM

## 2012-03-23 DIAGNOSIS — R609 Edema, unspecified: Secondary | ICD-10-CM

## 2012-03-23 DIAGNOSIS — R600 Localized edema: Secondary | ICD-10-CM

## 2012-03-23 DIAGNOSIS — B953 Streptococcus pneumoniae as the cause of diseases classified elsewhere: Secondary | ICD-10-CM

## 2012-03-23 DIAGNOSIS — L723 Sebaceous cyst: Secondary | ICD-10-CM

## 2012-03-23 DIAGNOSIS — J189 Pneumonia, unspecified organism: Secondary | ICD-10-CM

## 2012-03-23 DIAGNOSIS — E119 Type 2 diabetes mellitus without complications: Secondary | ICD-10-CM

## 2012-03-23 NOTE — Progress Notes (Signed)
Order to remove PICC line obtained from Dr. Linus Salmons. Pt. identified with name and date of birth. PICC dressing removed, site unremarkable. PICC line removed using sterile procedure @ 1650 pm. PICC length equal to that noted in pt's hospital chart of 46 cm. Sterile petroleum gauze + sterile 4X4 applied to PICC site, pressure applied for 10 minutes and covered with Medipore tape as a pressure dressing. Pt. instructed to limit use of arm for 1 hour. Pt. instructed that the pressure dressing should remain in place for 24 hours. Pt. verablized understanding of these instructions.

## 2012-03-23 NOTE — Progress Notes (Signed)
  Subjective:    Patient ID: [name] [name], male    DOB: Jun 14, 1966, 46 y.o.   MRN: [id number]  HPI Here for hospital follow up of his pneumococcal pneumonia.  He was hospitalized for SOB and noted on CXR to have an opacity and developed pneumococcal bacteremia.  He left about 1 week ago and has had completed a two week course of IV ceftriaxone.  No rashes, no diarrhea.  He had some renal insufficiency in the hospital that resolved and increase in LFTs.  Complaints he has is some pitting edema of his lower extremities, "salty" tasting food while on the antibiotics, a new cyst on his lip and forehead and occasional dry cough.  No fever or chills.  No dypsnea.  His appetite is improved.     Review of Systems  Constitutional: Positive for activity change, appetite change and fatigue. Negative for fever and chills.  Respiratory: Positive for cough. Negative for chest tightness, shortness of breath and wheezing.   Cardiovascular: Positive for leg swelling. Negative for chest pain and palpitations.  Gastrointestinal: Negative for abdominal pain and diarrhea.  Skin: Positive for rash.  Neurological: Negative for dizziness, weakness and light-headedness.  Hematological: Negative for adenopathy.  Psychiatric/Behavioral: Negative for dysphoric mood. The patient is not nervous/anxious.        Objective:   Physical Exam  Constitutional: He appears well-developed. No distress.       Thin appearing  HENT:  Mouth/Throat: Oropharynx is clear and moist. No oropharyngeal exudate.       + right cystic like lesion on right cheek near lip, on forehead  Eyes: Right eye exhibits no discharge. Left eye exhibits no discharge. No scleral icterus.  Cardiovascular: Normal rate, regular rhythm and normal heart sounds.  Exam reveals no gallop and no friction rub.   No murmur heard. Pulmonary/Chest: Effort normal and breath sounds normal. No respiratory distress. He has no wheezes. He has no rales.  Abdominal:  Soft. Bowel sounds are normal.  Lymphadenopathy:    He has no cervical adenopathy.  Skin: Skin is warm and dry. No rash noted.          Assessment & Plan:

## 2012-03-23 NOTE — Assessment & Plan Note (Signed)
He now has completed 2 weeks of therapy and doing well, no indication for extension of therapy.  Will remove his pICC line.  He can follow up PRN.  I am trying to facilitate as well getting him into his PCP for other issues.  I also have referred him to ophthalmology for his recent vision changes/blurriness.

## 2012-03-23 NOTE — Assessment & Plan Note (Signed)
I discussed with him the need to really control his diabetes well to improve his immune system.

## 2012-03-23 NOTE — Assessment & Plan Note (Signed)
His breathing is improved and no current signs of pneumonia.

## 2012-03-23 NOTE — Assessment & Plan Note (Signed)
Noted on face. Referred to dermatology for possible drainage.

## 2012-03-23 NOTE — Assessment & Plan Note (Signed)
He does have bilateral pitting edema and some cough when lying flat.  Likely multifactorial with nutrition issues.  Also could be related to cardiac issues.  I will see if his PCP can see him sooner.

## 2012-03-27 ENCOUNTER — Encounter (HOSPITAL_COMMUNITY): Payer: Self-pay | Admitting: Emergency Medicine

## 2012-03-27 ENCOUNTER — Inpatient Hospital Stay (HOSPITAL_COMMUNITY)
Admission: EM | Admit: 2012-03-27 | Discharge: 2012-04-14 | DRG: 163 | Disposition: A | Discharge status: Home Health | Payer: Managed Care, Other (non HMO) | Attending: Family Medicine | Admitting: Family Medicine

## 2012-03-27 DIAGNOSIS — R7989 Other specified abnormal findings of blood chemistry: Secondary | ICD-10-CM | POA: Diagnosis present

## 2012-03-27 DIAGNOSIS — R0602 Shortness of breath: Secondary | ICD-10-CM

## 2012-03-27 DIAGNOSIS — L729 Follicular cyst of the skin and subcutaneous tissue, unspecified: Secondary | ICD-10-CM

## 2012-03-27 DIAGNOSIS — D126 Benign neoplasm of colon, unspecified: Secondary | ICD-10-CM | POA: Diagnosis present

## 2012-03-27 DIAGNOSIS — N179 Acute kidney failure, unspecified: Secondary | ICD-10-CM | POA: Diagnosis present

## 2012-03-27 DIAGNOSIS — E119 Type 2 diabetes mellitus without complications: Secondary | ICD-10-CM | POA: Diagnosis present

## 2012-03-27 DIAGNOSIS — R17 Unspecified jaundice: Secondary | ICD-10-CM

## 2012-03-27 DIAGNOSIS — K209 Esophagitis, unspecified without bleeding: Secondary | ICD-10-CM | POA: Diagnosis present

## 2012-03-27 DIAGNOSIS — D649 Anemia, unspecified: Secondary | ICD-10-CM | POA: Diagnosis present

## 2012-03-27 DIAGNOSIS — E871 Hypo-osmolality and hyponatremia: Secondary | ICD-10-CM | POA: Diagnosis present

## 2012-03-27 DIAGNOSIS — K029 Dental caries, unspecified: Secondary | ICD-10-CM | POA: Diagnosis present

## 2012-03-27 DIAGNOSIS — R7881 Bacteremia: Secondary | ICD-10-CM | POA: Diagnosis present

## 2012-03-27 DIAGNOSIS — J869 Pyothorax without fistula: Secondary | ICD-10-CM | POA: Diagnosis present

## 2012-03-27 DIAGNOSIS — J189 Pneumonia, unspecified organism: Secondary | ICD-10-CM

## 2012-03-27 DIAGNOSIS — E1139 Type 2 diabetes mellitus with other diabetic ophthalmic complication: Secondary | ICD-10-CM | POA: Diagnosis present

## 2012-03-27 DIAGNOSIS — B953 Streptococcus pneumoniae as the cause of diseases classified elsewhere: Secondary | ICD-10-CM

## 2012-03-27 DIAGNOSIS — K122 Cellulitis and abscess of mouth: Secondary | ICD-10-CM | POA: Diagnosis present

## 2012-03-27 DIAGNOSIS — R600 Localized edema: Secondary | ICD-10-CM

## 2012-03-27 DIAGNOSIS — B954 Other streptococcus as the cause of diseases classified elsewhere: Secondary | ICD-10-CM | POA: Diagnosis present

## 2012-03-27 DIAGNOSIS — R7401 Elevation of levels of liver transaminase levels: Secondary | ICD-10-CM

## 2012-03-27 DIAGNOSIS — E8809 Other disorders of plasma-protein metabolism, not elsewhere classified: Secondary | ICD-10-CM | POA: Diagnosis present

## 2012-03-27 DIAGNOSIS — J90 Pleural effusion, not elsewhere classified: Secondary | ICD-10-CM

## 2012-03-27 DIAGNOSIS — E1165 Type 2 diabetes mellitus with hyperglycemia: Secondary | ICD-10-CM | POA: Diagnosis present

## 2012-03-27 DIAGNOSIS — J13 Pneumonia due to Streptococcus pneumoniae: Principal | ICD-10-CM | POA: Diagnosis present

## 2012-03-27 DIAGNOSIS — E11359 Type 2 diabetes mellitus with proliferative diabetic retinopathy without macular edema: Secondary | ICD-10-CM | POA: Diagnosis present

## 2012-03-27 HISTORY — DX: Bacteremia: R78.81

## 2012-03-27 HISTORY — DX: Pneumonia, unspecified organism: J18.9

## 2012-03-27 MED ORDER — ONDANSETRON HCL 4 MG/2ML IJ SOLN
4.0000 mg | Freq: Once | INTRAMUSCULAR | Status: AC
  Administered 2012-03-28: 4 mg via INTRAVENOUS
  Filled 2012-03-27: qty 2

## 2012-03-27 MED ORDER — MORPHINE SULFATE 4 MG/ML IJ SOLN
4.0000 mg | Freq: Once | INTRAMUSCULAR | Status: AC
  Administered 2012-03-28: 4 mg via INTRAVENOUS
  Filled 2012-03-27: qty 1

## 2012-03-27 NOTE — ED Provider Notes (Signed)
History     CSN: [id number]  Arrival date & time 03/27/12  2157   First MD Initiated Contact with Patient 03/27/12 2330      Chief Complaint  Patient presents with  . Emesis    (Consider location/radiation/quality/duration/timing/severity/associated sxs/prior treatment) HPI Pt presents with c/o right sided chest pain, shortness of breath and emesis.  Pt has a complicated medical history with recent pneumonia, and strep pneumo bacteremia- was treated with IV rocephin through PICC line which was removed 03/23/12.  Pt states he had continued to feel weak, but over the past several days has developed right sided chest pain which is worse with deep breaths and feels it is hard to take a deep breath.  Also worse with couhging, cough is nonproductive. No fever/chills.  Increased fatigue.  Today he had several episodes of emesis as well which made the pain worse.  Denies abdominal pain.  Pain worse with movement and palpation.  There are no other associated systemic symptoms.  There are no other alleviating or modifying factors.   Past Medical History  Diagnosis Date  . Diabetes mellitus   . Pneumonia 02/2012    Strep pneumoniae bilateral pneumonia complicated by bacteremia  . Bacteremia     History reviewed. No pertinent past surgical history.  No family history on file.  History  Substance Use Topics  . Smoking status: Never Smoker   . Smokeless tobacco: Never Used  . Alcohol Use: No      Review of Systems ROS reviewed and all otherwise negative except for mentioned in HPI  Allergies  Penicillins  Home Medications   No current outpatient prescriptions on file.  BP 125/77  Pulse 95  Temp(Src) 99.4 F (37.4 C) (Oral)  Resp 20  Ht 6\' 2"  (1.88 m)  Wt 193 lb 5.5 oz (87.7 kg)  BMI 24.82 kg/m2  SpO2 92% Vitals reviewed Physical Exam Physical Examination: General appearance - alert, ill and uncomfortable appearing, but in no acute distress Mental status - alert, oriented to  person, place, and time Eyes - pupils equal and reactive, no conjunctival injection, no  Mouth - mucous membranes moist, pharynx normal without lesions Chest - decreased air movement on right, no wheezes or crackles, mild tachypnea Heart - normal rate, regular rhythm, normal S1, S2, no murmurs, rubs, clicks or gallops Abdomen - soft, nontender, nondistended, no masses or organomegaly, nabs Musculoskeletal - no joint tenderness, deformity or swelling Extremities - peripheral pulses normal, 1+ bilateral pedal edema, no clubbing or cyanosis Skin - normal coloration and turgor, no rashes Psych- anxious appearing  ED Course  Procedures (including critical care time)  11:36 PM went to see patient, not in room     Date: 03/28/2012  Rate: 94  Rhythm: normal sinus rhythm  QRS Axis: normal  Intervals: normal  ST/T Wave abnormalities: normal  Conduction Disutrbances: none  Narrative Interpretation: unremarkable       2:27 AM D/w Dr. Rolene Arbour, [facility] for admission- pt to go to med/surg bed, [facility] Team 3, temporary admission orders written  Labs Reviewed  URINALYSIS, ROUTINE W REFLEX MICROSCOPIC - Abnormal; Notable for the following:    Color, Urine AMBER (*) BIOCHEMICALS MAY BE AFFECTED BY COLOR   Glucose, UA 100 (*)    Hgb urine dipstick TRACE (*)    Protein, ur 100 (*)    All other components within normal limits  CBC - Abnormal; Notable for the following:    WBC 19.6 (*)    RBC 2.31 (*)  Hemoglobin 7.0 (*)    HCT 21.2 (*)    RDW 15.6 (*)    All other components within normal limits  COMPREHENSIVE METABOLIC PANEL - Abnormal; Notable for the following:    Sodium 133 (*)    Glucose, Bld 160 (*)    BUN 40 (*)    Albumin 2.5 (*)    AST 107 (*)    ALT 104 (*)    Alkaline Phosphatase 365 (*)    Total Bilirubin 1.6 (*)    GFR calc non Af Amer 77 (*)    GFR calc Af Amer 89 (*)    All other components within normal limits  PRO B NATRIURETIC PEPTIDE - Abnormal; Notable for the  following:    Pro B Natriuretic peptide (BNP) 178.6 (*)    All other components within normal limits  GLUCOSE, CAPILLARY - Abnormal; Notable for the following:    Glucose-Capillary 216 (*)    All other components within normal limits  GLUCOSE, CAPILLARY - Abnormal; Notable for the following:    Glucose-Capillary 308 (*)    All other components within normal limits  GLUCOSE, CAPILLARY - Abnormal; Notable for the following:    Glucose-Capillary 295 (*)    All other components within normal limits  POCT I-STAT TROPONIN I  TYPE AND SCREEN  ABO/RH  URINE MICROSCOPIC-ADD ON  CULTURE, BLOOD (ROUTINE X 2)  CULTURE, BLOOD (ROUTINE X 2)  COMPREHENSIVE METABOLIC PANEL  CBC  VANCOMYCIN, TROUGH   Dg Chest 2 View  03/28/2012  *RADIOLOGY REPORT*  Clinical Data: Cough, right rib pain, emesis, recent pneumonia  CHEST - 2 VIEW  Comparison: 03/16/2012  Findings: Patchy right lower lobe opacity, suspicious for pneumonia, with associated small-to-moderate right pleural effusion.  Left basilar opacity, atelectasis versus pneumonia, with suspected small left pleural effusion.  No pneumothorax.  The heart is normal in size.  Interval removal of right subclavian PICC.  Visualized osseous structures are within normal limits.  IMPRESSION: Patchy right lower lobe opacity, suspicious for pneumonia, with associated small-to-moderate right pleural effusion.  Left basilar opacity, atelectasis versus pneumonia, with suspected small left pleural effusion.  Original Report Authenticated By: Julian Hy, M.D.   Ct Chest Wo Contrast  03/28/2012  *RADIOLOGY REPORT*  Clinical Data: Cough, right-sided chest pain  CT CHEST WITHOUT CONTRAST  Technique:  Multidetector CT imaging of the chest was performed following the standard protocol without IV contrast.  Comparison: Chest radiographs dated 03/28/2012.  CT chest dated 03/11/2012.  Findings: Patchy posterior right upper lobe opacity and near complete right lower lobe  consolidation, suspicious for pneumonia. Associated moderate right pleural effusion.  Patchy left lower lobe opacity, atelectasis versus pneumonia, with associated small left pleural effusion.  No pneumothorax.  Heart is normal in size.  Small pericardial effusion.  Visualized upper abdomen is unremarkable.  Mild degenerative changes of the visualized thoracolumbar spine.  IMPRESSION: Right upper/lower lobe pneumonia, with associated moderate right pleural effusion.  Patchy left lower lobe opacity, atelectasis versus pneumonia, with associated small left pleural effusion.  Original Report Authenticated By: Julian Hy, M.D.   [place] Chest  03/28/2012  *RADIOLOGY REPORT*  Clinical Data: 46 year old male with pneumonia and pleural effusions on chest CT.  Assess for thoracentesis.  CHEST ULTRASOUND  Comparison: Chest CT without contrast 03/28/2012 and earlier.  Findings: Gray-scale imaging of the right hemithorax demonstrates a small component of pleural fluid, with a larger adjacent component of consolidated lung.  No free fluid seen subjacent to the right hemidiaphragm.  The quantity  of pleural fluid identified is insufficient for thoracentesis.  IMPRESSION: Right pleural effusion and larger component of right lung consolidation on ultrasound.  Pleural fluid component quantity insufficient for thoracentesis at this time.  Original Report Authenticated By: Randall An, M.D.   US Abdomen Complete  03/28/2012  *RADIOLOGY REPORT*  Clinical Data:  Elevated LFTs.  Right upper quadrant pain and nausea.  ABDOMINAL ULTRASOUND COMPLETE  Comparison:  03/12/2012  Findings:  Gallbladder:  No gallstones, gallbladder wall thickening, or pericholecystic fluid. Negative sonographic Murphy's sign reported by the sonographer.  Common Bile Duct:  Within normal limits in caliber.  Liver: No focal mass lesion identified.  Within normal limits in parenchymal echogenicity.  IVC:  Appears normal.  Pancreas:  No abnormality  identified.  Spleen:  Within normal limits in size and echotexture.  Right kidney:  Normal in size and parenchymal echogenicity.  No evidence of mass or hydronephrosis.  Left kidney:  Normal in size and parenchymal echogenicity.  No evidence of mass or hydronephrosis.  Abdominal Aorta:  No aneurysm identified.  Other findings:  Small bilateral pleural effusions.  Small amount of upper abdominal ascites posterior to the liver and spleen.  IMPRESSION:  1.  Small amount of abdominal ascites. 2.  Small pleural effusions.  Original Report Authenticated By: Angelita Ingles, M.D.     1. Parapneumonic effusion   2. Transaminitis   3. Anemia   4. Shortness of breath       MDM  Pt presents with sob, chest pain on right side, CXR reveals right sided consolidation and pleural effusion which has increased since prior CXR and he is s/p full course of rocephin via PICC line.  Pt started on vanc and zosyn in ED for HCAP- he is uncomfortable but not unstable at this time- will need diagnostic but not therapeutic thoracentesis on an emergent basis.  Pt admitted to [facility] for further management. He also has a transaminitis- abdominal ultrasound ordered, he is also anemic- this appears to be at his baseline.  Type and screen ordered.   Prior records reviewed and considered during this visit        Threasa Beards, MD 03/29/12 [phone]

## 2012-03-27 NOTE — ED Notes (Signed)
Pt to ED via EMS.  Pt states he was discharged from [facility] on 3/25 for a blood infection and pneumonia.  Reports coughing since last night and sharp R sided pain.  Pt reports fever, nausea, and vomiting since this afternoon.

## 2012-03-28 ENCOUNTER — Inpatient Hospital Stay (HOSPITAL_COMMUNITY): Payer: Managed Care, Other (non HMO)

## 2012-03-28 ENCOUNTER — Emergency Department (HOSPITAL_COMMUNITY): Payer: Managed Care, Other (non HMO)

## 2012-03-28 ENCOUNTER — Other Ambulatory Visit: Payer: Self-pay

## 2012-03-28 ENCOUNTER — Encounter (HOSPITAL_COMMUNITY): Payer: Self-pay | Admitting: Internal Medicine

## 2012-03-28 DIAGNOSIS — J189 Pneumonia, unspecified organism: Secondary | ICD-10-CM | POA: Diagnosis present

## 2012-03-28 DIAGNOSIS — D649 Anemia, unspecified: Secondary | ICD-10-CM | POA: Diagnosis present

## 2012-03-28 DIAGNOSIS — R17 Unspecified jaundice: Secondary | ICD-10-CM | POA: Diagnosis present

## 2012-03-28 DIAGNOSIS — E8809 Other disorders of plasma-protein metabolism, not elsewhere classified: Secondary | ICD-10-CM | POA: Diagnosis present

## 2012-03-28 LAB — URINALYSIS, ROUTINE W REFLEX MICROSCOPIC
Bilirubin Urine: NEGATIVE
Glucose, UA: 100 mg/dL — AB
Ketones, ur: NEGATIVE mg/dL
Leukocytes, UA: NEGATIVE
Nitrite: NEGATIVE
Protein, ur: 100 mg/dL — AB
Specific Gravity, Urine: 1.021 (ref 1.005–1.030)
Urobilinogen, UA: 0.2 mg/dL (ref 0.0–1.0)
pH: 5 (ref 5.0–8.0)

## 2012-03-28 LAB — POCT I-STAT TROPONIN I: Troponin i, poc: 0.01 ng/mL (ref 0.00–0.08)

## 2012-03-28 LAB — COMPREHENSIVE METABOLIC PANEL
ALT: 104 U/L — ABNORMAL HIGH (ref 0–53)
AST: 107 U/L — ABNORMAL HIGH (ref 0–37)
Albumin: 2.5 g/dL — ABNORMAL LOW (ref 3.5–5.2)
Alkaline Phosphatase: 365 U/L — ABNORMAL HIGH (ref 39–117)
BUN: 40 mg/dL — ABNORMAL HIGH (ref 6–23)
CO2: 25 mEq/L (ref 19–32)
Calcium: 8.5 mg/dL (ref 8.4–10.5)
Chloride: 98 mEq/L (ref 96–112)
Creatinine, Ser: 1.13 mg/dL (ref 0.50–1.35)
GFR calc Af Amer: 89 mL/min — ABNORMAL LOW (ref >90)
GFR calc non Af Amer: 77 mL/min — ABNORMAL LOW (ref >90)
Glucose, Bld: 160 mg/dL — ABNORMAL HIGH (ref 70–99)
Potassium: 4.4 mEq/L (ref 3.5–5.1)
Sodium: 133 mEq/L — ABNORMAL LOW (ref 135–145)
Total Bilirubin: 1.6 mg/dL — ABNORMAL HIGH (ref 0.3–1.2)
Total Protein: 7.1 g/dL (ref 6.0–8.3)

## 2012-03-28 LAB — CBC
HCT: 21.2 % — ABNORMAL LOW (ref 39.0–52.0)
Hemoglobin: 7 g/dL — ABNORMAL LOW (ref 13.0–17.0)
MCH: 30.3 pg (ref 26.0–34.0)
MCHC: 33 g/dL (ref 30.0–36.0)
MCV: 91.8 fL (ref 78.0–100.0)
Platelets: 395 10*3/uL (ref 150–400)
RBC: 2.31 MIL/uL — ABNORMAL LOW (ref 4.22–5.81)
RDW: 15.6 % — ABNORMAL HIGH (ref 11.5–15.5)
WBC: 19.6 10*3/uL — ABNORMAL HIGH (ref 4.0–10.5)

## 2012-03-28 LAB — ABO/RH: ABO/RH(D): O POS

## 2012-03-28 LAB — PRO B NATRIURETIC PEPTIDE: Pro B Natriuretic peptide (BNP): 178.6 pg/mL — ABNORMAL HIGH (ref 0–125)

## 2012-03-28 LAB — URINE MICROSCOPIC-ADD ON

## 2012-03-28 LAB — GLUCOSE, CAPILLARY
Glucose-Capillary: 216 mg/dL — ABNORMAL HIGH (ref 70–99)
Glucose-Capillary: 308 mg/dL — ABNORMAL HIGH (ref 70–99)
Glucose-Capillary: 295 mg/dL — ABNORMAL HIGH (ref 70–99)

## 2012-03-28 MED ORDER — MORPHINE SULFATE 4 MG/ML IJ SOLN
INTRAMUSCULAR | Status: AC
  Administered 2012-03-28: 4 mg via INTRAVENOUS
  Filled 2012-03-28: qty 1

## 2012-03-28 MED ORDER — INSULIN ASPART 100 UNIT/ML ~~LOC~~ SOLN: 0.0000 [IU] | Freq: Three times a day (TID) | SUBCUTANEOUS | Status: DC

## 2012-03-28 MED ORDER — FUROSEMIDE 20 MG PO TABS
20.0000 mg | ORAL_TABLET | Freq: Every day | ORAL | Status: DC
  Administered 2012-03-29 – 2012-03-30 (×2): 20 mg via ORAL
  Filled 2012-03-28 (×2): qty 1

## 2012-03-28 MED ORDER — MORPHINE SULFATE 2 MG/ML IJ SOLN
2.0000 mg | INTRAMUSCULAR | Status: DC | PRN
  Administered 2012-03-28 – 2012-04-06 (×7): 2 mg via INTRAVENOUS
  Filled 2012-03-28 (×7): qty 1

## 2012-03-28 MED ORDER — VANCOMYCIN HCL IN DEXTROSE 1-5 GM/200ML-% IV SOLN
1000.0000 mg | Freq: Once | INTRAVENOUS | Status: AC
  Administered 2012-03-28: 1000 mg via INTRAVENOUS
  Filled 2012-03-28: qty 200

## 2012-03-28 MED ORDER — SODIUM CHLORIDE 0.9 % IJ SOLN: 3.0000 mL | INTRAMUSCULAR | Status: DC | PRN

## 2012-03-28 MED ORDER — INSULIN ASPART 100 UNIT/ML ~~LOC~~ SOLN
0.0000 [IU] | Freq: Three times a day (TID) | SUBCUTANEOUS | Status: DC
  Administered 2012-03-28 – 2012-03-29 (×2): 7 [IU] via SUBCUTANEOUS
  Administered 2012-03-29: 9 [IU] via SUBCUTANEOUS
  Administered 2012-03-29: 2 [IU] via SUBCUTANEOUS
  Administered 2012-03-30: 3 [IU] via SUBCUTANEOUS
  Administered 2012-03-30: 2 [IU] via SUBCUTANEOUS
  Administered 2012-04-01: 1 [IU] via SUBCUTANEOUS
  Administered 2012-04-01: 2 [IU] via SUBCUTANEOUS
  Administered 2012-04-02: 5 [IU] via SUBCUTANEOUS
  Administered 2012-04-02: 2 [IU] via SUBCUTANEOUS
  Administered 2012-04-03: 3 [IU] via SUBCUTANEOUS
  Administered 2012-04-05: 1 [IU] via SUBCUTANEOUS
  Administered 2012-04-07: 3 [IU] via SUBCUTANEOUS

## 2012-03-28 MED ORDER — PIPERACILLIN-TAZOBACTAM 3.375 G IVPB
3.3750 g | Freq: Once | INTRAVENOUS | Status: AC
  Administered 2012-03-28: 3.375 g via INTRAVENOUS
  Filled 2012-03-28: qty 50

## 2012-03-28 MED ORDER — VANCOMYCIN HCL IN DEXTROSE 1-5 GM/200ML-% IV SOLN
1000.0000 mg | Freq: Three times a day (TID) | INTRAVENOUS | Status: DC
  Administered 2012-03-28 – 2012-04-03 (×18): 1000 mg via INTRAVENOUS
  Filled 2012-03-28 (×20): qty 200

## 2012-03-28 MED ORDER — OXYCODONE HCL 5 MG PO TABS
5.0000 mg | ORAL_TABLET | ORAL | Status: DC | PRN
  Administered 2012-03-28 – 2012-04-06 (×3): 5 mg via ORAL
  Filled 2012-03-28 (×3): qty 1

## 2012-03-28 MED ORDER — SODIUM CHLORIDE 0.9 % IV SOLN: INTRAVENOUS | Status: DC

## 2012-03-28 MED ORDER — DOCUSATE SODIUM 100 MG PO CAPS
100.0000 mg | ORAL_CAPSULE | Freq: Two times a day (BID) | ORAL | Status: DC
  Administered 2012-03-28 – 2012-04-08 (×18): 100 mg via ORAL
  Filled 2012-03-28 (×16): qty 1

## 2012-03-28 MED ORDER — ACETAMINOPHEN 325 MG PO TABS: 650.0000 mg | ORAL_TABLET | Freq: Four times a day (QID) | ORAL | Status: DC | PRN

## 2012-03-28 MED ORDER — ASPIRIN 81 MG PO TABS: 81.0000 mg | ORAL_TABLET | ORAL | Status: DC

## 2012-03-28 MED ORDER — INSULIN GLARGINE 100 UNIT/ML ~~LOC~~ SOLN
40.0000 [IU] | Freq: Every day | SUBCUTANEOUS | Status: DC
  Administered 2012-03-28 – 2012-04-02 (×6): 40 [IU] via SUBCUTANEOUS
  Administered 2012-04-03: 100 [IU] via SUBCUTANEOUS

## 2012-03-28 MED ORDER — SENNA 8.6 MG PO TABS
1.0000 | ORAL_TABLET | Freq: Two times a day (BID) | ORAL | Status: DC
Start: 2012-03-28 — End: 2012-04-09
  Administered 2012-03-28 – 2012-04-08 (×20): 8.6 mg via ORAL
  Filled 2012-03-28 (×28): qty 1

## 2012-03-28 MED ORDER — PIPERACILLIN-TAZOBACTAM 3.375 G IVPB
3.3750 g | Freq: Three times a day (TID) | INTRAVENOUS | Status: DC
  Administered 2012-03-28 – 2012-03-31 (×9): 3.375 g via INTRAVENOUS
  Filled 2012-03-28 (×12): qty 50

## 2012-03-28 MED ORDER — ASPIRIN 81 MG PO CHEW
81.0000 mg | CHEWABLE_TABLET | Freq: Every day | ORAL | Status: DC
  Administered 2012-03-29: 81 mg via ORAL
  Filled 2012-03-28 (×2): qty 1

## 2012-03-28 MED ORDER — HEPARIN SODIUM (PORCINE) 5000 UNIT/ML IJ SOLN
5000.0000 [IU] | Freq: Three times a day (TID) | INTRAMUSCULAR | Status: DC
  Administered 2012-03-28 – 2012-03-30 (×6): 5000 [IU] via SUBCUTANEOUS
  Filled 2012-03-28 (×10): qty 1

## 2012-03-28 MED ORDER — LISINOPRIL 5 MG PO TABS
5.0000 mg | ORAL_TABLET | Freq: Every day | ORAL | Status: DC
  Administered 2012-03-28 – 2012-03-30 (×2): 5 mg via ORAL
  Filled 2012-03-28 (×3): qty 1

## 2012-03-28 MED ORDER — SODIUM CHLORIDE 0.9 % IJ SOLN
3.0000 mL | Freq: Two times a day (BID) | INTRAMUSCULAR | Status: DC
  Administered 2012-03-29 – 2012-04-08 (×16): 3 mL via INTRAVENOUS

## 2012-03-28 MED ORDER — ONDANSETRON HCL 4 MG PO TABS: 4.0000 mg | ORAL_TABLET | Freq: Four times a day (QID) | ORAL | Status: DC | PRN

## 2012-03-28 MED ORDER — ONDANSETRON HCL 4 MG/2ML IJ SOLN: 4.0000 mg | Freq: Four times a day (QID) | INTRAMUSCULAR | Status: DC | PRN

## 2012-03-28 MED ORDER — ACETAMINOPHEN 650 MG RE SUPP: 650.0000 mg | Freq: Four times a day (QID) | RECTAL | Status: DC | PRN

## 2012-03-28 MED ORDER — ATORVASTATIN CALCIUM 20 MG PO TABS
20.0000 mg | ORAL_TABLET | Freq: Every day | ORAL | Status: DC
  Administered 2012-03-28 – 2012-04-13 (×16): 20 mg via ORAL
  Filled 2012-03-28 (×19): qty 1

## 2012-03-28 MED ORDER — SODIUM CHLORIDE 0.9 % IV SOLN: 250.0000 mL | INTRAVENOUS | Status: DC | PRN

## 2012-03-28 NOTE — Progress Notes (Signed)
[place]  INITIAL NOTE  Pharmacy Consult for: Vancomycin, Zosyn  Indication: rule out pneumonia   Patient Data:   Allergies: Allergies  Allergen Reactions  . Penicillins Rash    Tolerating Ceftriaxone (03/12/12)    Patient Measurements: Height: 6\' 2"  (188 cm) Weight: 193 lb 5.5 oz (87.7 kg) IBW/kg (Calculated) : 82.2   Vital Signs: Temp:  [97.7 F (36.5 C)-99 F (37.2 C)] 99 F (37.2 C) (04/06 0639) Pulse Rate:  [85-95] 85  (04/06 0639) Resp:  [16-18] 18  (04/06 0639) BP: (113-122)/(70-79) 118/77 mmHg (04/06 0639) SpO2:  [90 %-93 %] 90 % (04/06 0639) Weight:  [193 lb 5.5 oz (87.7 kg)] 193 lb 5.5 oz (87.7 kg) (04/06 0427)  Intake/Output from previous day: No intake or output data in the 24 hours ending 03/28/12 0701  Labs:  Basename 03/28/12 0007  WBC 19.6*  HGB 7.0*  PLT 395  LABCREA --  CREATININE 1.13   Estimated Creatinine Clearance: 96 ml/min (by C-G formula based on Cr of 1.13). No results found for this basename: VANCOTROUGH:2,VANCOPEAK:2,VANCORANDOM:2,GENTTROUGH:2,GENTPEAK:2,GENTRANDOM:2,TOBRATROUGH:2,TOBRAPEAK:2,TOBRARND:2,AMIKACINPEAK:2,AMIKACINTROU:2,AMIKACIN:2, in the last 72 hours   Microbiology: Recent Results (from the past 720 hour(s))  CULTURE, BLOOD (ROUTINE X 2)     Status: Normal   Collection Time   03/11/12  1:00 PM      Component Value Range Status Comment   Specimen Description BLOOD RIGHT ANTECUBITAL   Final    Special Requests NONE BOTTLES DRAWN AEROBIC AND ANAEROBIC [facility]   Final    Culture  Setup Time [id number]   Final    Culture     Final    Value: STREPTOCOCCUS PNEUMONIAE     Note: SUSCEPTIBILITIES PERFORMED ON PREVIOUS CULTURE WITHIN THE LAST 5 DAYS.     Note: Gram Stain Report Called to,Read Back By and Verified With: COURTNEY DRIGGERS @ [id number] 03/12/12 [place]   Report Status 03/14/2012 FINAL   Final   CULTURE, BLOOD (ROUTINE X 2)     Status: Normal   Collection Time   03/11/12  2:10 PM      Component  Value Range Status Comment   Specimen Description BLOOD RIGHT HAND   Final    Special Requests NONE BOTTLES DRAWN AEROBIC AND ANAEROBIC [facility]   Final    Culture  Setup Time [id number]   Final    Culture     Final    Value: GRAM POSITIVE COCCI IN PAIRS     STREPTOCOCCUS PNEUMONIAE     Note: Gram Stain Report Called to,Read Back By and Verified With: COURTNEY DRIGGERS @ [id number] 03/12/12 [place]   Report Status 03/14/2012 FINAL   Final    Organism ID, Bacteria STREPTOCOCCUS PNEUMONIAE   Final   CULTURE, BLOOD (ROUTINE X 2)     Status: Normal   Collection Time   03/15/12  1:15 PM      Component Value Range Status Comment   Specimen Description BLOOD RIGHT ARM   Final    Special Requests BOTTLES DRAWN AEROBIC AND ANAEROBIC 10CC   Final    Culture  Setup Time [id number]   Final    Culture NO GROWTH 5 DAYS   Final    Report Status 03/21/2012 FINAL   Final   CULTURE, BLOOD (ROUTINE X 2)     Status: Normal   Collection Time   03/15/12  1:20 PM      Component Value Range Status Comment   Specimen Description BLOOD LEFT ARM   Final  Special Requests BOTTLES DRAWN AEROBIC AND ANAEROBIC 10CC   Final    Culture  Setup Time [id number]   Final    Culture NO GROWTH 5 DAYS   Final    Report Status 03/21/2012 FINAL   Final   MRSA PCR SCREENING     Status: Normal   Collection Time   03/16/12 11:50 AM      Component Value Range Status Comment   MRSA by PCR NEGATIVE  NEGATIVE  Final     Medical History: Past Medical History  Diagnosis Date  . Diabetes mellitus   . Pneumonia 02/2012    Strep pneumoniae bilateral pneumonia complicated by bacteremia  . Bacteremia     Scheduled Medications:     . aspirin  81 mg Oral Daily  . atorvastatin  20 mg Oral q1800  . docusate sodium  100 mg Oral BID  . heparin  5,000 Units Subcutaneous Q8H  . insulin glargine  40 Units Subcutaneous QHS  . lisinopril  5 mg Oral Daily  .  morphine injection  4 mg Intravenous Once  . morphine      . ondansetron  4  mg Intravenous Once  . piperacillin-tazobactam (ZOSYN)  IV  3.375 g Intravenous Once  . senna  1 tablet Oral BID  . sodium chloride  3 mL Intravenous Q12H  . vancomycin  1,000 mg Intravenous Once  . DISCONTD: sodium chloride   Intravenous STAT  . DISCONTD: aspirin  81 mg Oral BH-q7a      Assessment:  46 y.o. male admitted on 03/27/2012, with pneumonia. Pharmacy consulted to manage vancomycin and Zosyn. Patient with documented penicillin allergy (rash), but patient already received Zosyn and vancomycin in ED without adverse reaction.    Goal of Therapy:  1. Vancomycin trough level 15-20 mcg/ml  Plan:  1. Zosyn 3.375gm IV Q8H (4 hour infusion).  2. Vancomycin 1gm IV Q8H.   Monia Sabal Doy Mince, PharmD 03/28/2012, 7:01 AM

## 2012-03-28 NOTE — Progress Notes (Signed)
INITIAL ADULT NUTRITION ASSESSMENT Date: 03/28/2012   Time: 10:52 AM Reason for Assessment: Consult  ASSESSMENT: Male 45 y.o.  Dx: Parapneumonic effusion  Hx:  Past Medical History  Diagnosis Date  . Diabetes mellitus   . Pneumonia 02/2012    Strep pneumoniae bilateral pneumonia complicated by bacteremia  . Bacteremia     Related Meds:     . aspirin  81 mg Oral Daily  . atorvastatin  20 mg Oral q1800  . docusate sodium  100 mg Oral BID  . heparin  5,000 Units Subcutaneous Q8H  . insulin glargine  40 Units Subcutaneous QHS  . lisinopril  5 mg Oral Daily  .  morphine injection  4 mg Intravenous Once  . morphine      . ondansetron  4 mg Intravenous Once  . piperacillin-tazobactam (ZOSYN)  IV  3.375 g Intravenous Once  . piperacillin-tazobactam (ZOSYN)  IV  3.375 g Intravenous Q8H  . senna  1 tablet Oral BID  . sodium chloride  3 mL Intravenous Q12H  . vancomycin  1,000 mg Intravenous Once  . vancomycin  1,000 mg Intravenous Q8H  . DISCONTD: sodium chloride   Intravenous STAT  . DISCONTD: aspirin  81 mg Oral BH-q7a     Ht: 6\' 2"  (188 cm)  Wt: 193 lb 5.5 oz (87.7 kg)  Ideal Wt: 86.4 kg % Ideal Wt: 101%  Usual Wt: 205 lbs  Per pt report Wt Readings from Last 3 Encounters:  03/28/12 193 lb 5.5 oz (87.7 kg)  03/23/12 193 lb (87.544 kg)  03/12/12 185 lb 10 oz (84.2 kg)   % Usual Wt: 94%  Body mass index is 24.82 kg/(m^2). WNL  Food/Nutrition Related Hx: Pt reports weight loss more then 10 lbs in last month per nutrition risk report. When speaking with pt he reports a weight increase, this is supported with weight hx.   Labs:  CMP     Component Value Date/Time   NA 133* 03/28/2012 0007   K 4.4 03/28/2012 0007   CL 98 03/28/2012 0007   CO2 25 03/28/2012 0007   GLUCOSE 160* 03/28/2012 0007   BUN 40* 03/28/2012 0007   CREATININE 1.13 03/28/2012 0007   CALCIUM 8.5 03/28/2012 0007   PROT 7.1 03/28/2012 0007   ALBUMIN 2.5* 03/28/2012 0007   AST 107* 03/28/2012 0007   ALT 104*  03/28/2012 0007   ALKPHOS 365* 03/28/2012 0007   BILITOT 1.6* 03/28/2012 0007   GFRNONAA 77* 03/28/2012 0007   GFRAA 89* 03/28/2012 0007   Lab Results  Component Value Date   HGBA1C 15.7* 03/12/2012   No intake or output data in the 24 hours ending 03/28/12 1054   Diet Order: Carb Control  Supplements/Tube Feeding: none  IVF:    Estimated Nutritional Needs:   Kcal: 2100-2300 Protein:  85-100 gm Fluid:  2.1 - 2.3 L  Pt seen on previous admission for DM education. Pt reports doing better with DM control, but still needs some help with meal planning, RD to bring meal ideas to pt this afternoon. Pt reports increased appetite and weight gain since last admission.  RD consult for low albumin, pt with edema on admission, this is likely skewing lab value. Pt has had good po intake PTA, eats protein foods often.   NUTRITION DIAGNOSIS: -Food and nutrition related knowledge deficit (NB-1.1).  Status: Ongoing  RELATED TO: having difficulty creating meals that fit his diet and don't cause BS spikes  AS EVIDENCE BY: pt request for meal  ideas  MONITORING/EVALUATION(Goals): Goal: PO intake will be >75% most meals Monitor: PO intake, weight, labs, I/O's, education needs  EDUCATION NEEDS: -Education needs addressed  INTERVENTION: 1. RD provided pt with meal planning information for DM 2. RD will continue to follow  Dietitian [phone]  [employer] Per approved criteria  -Not Applicable    Orson Slick MARIE 03/28/2012, 10:52 AM

## 2012-03-28 NOTE — Progress Notes (Signed)
PT Cancellation Note  Treatment cancelled today due to patient's refusal to participate. Pt complains of pain and does not want to move today. Will attempt evaluation tomorrow pending medical stability.  Thanks, 03/28/2012 Ambrose Finland DPT PAGER: [phone] OFFICE: [phone]    Ambrose Finland 03/28/2012, 11:27 AM

## 2012-03-28 NOTE — Progress Notes (Signed)
Clinical Social Worker received referral, patient discharge plan per chart is for return home once medically ready.  Referral states may need home health needs at discharge - please contact case manager.  Clinical Social Worker will sign off for now as social work intervention is no longer needed. Please consult [place] again if new need arises.  [place], [place]

## 2012-03-28 NOTE — Progress Notes (Signed)
Patient ID: [name] [name], male   DOB: 02/15/1966, 46 y.o.   MRN: [id number]   [place] of Rt chest very small sliver of effusion Not really enough to gain access for thora- High risk NO thora performed today  Pt will return to room after [place] abd  Message placed to MD

## 2012-03-28 NOTE — H&P (Signed)
PCP:  [name], MD, MD Confirmed with pt Followed up recently with Dr. Linus Salmons in ID  Chief Complaint:  Right sided pleuritic chest pain  HPI: 54yoM with h/o poorly controlled DM, recent admission for strep pneumoniae bacteremia presents with  cough, chest pain, and found to have new pleural effusions, worsening LFT's.   Pt was admitted to [facility] 3/21-3/25 with right sided pleuritic chest pain, and at [facility] had CTA  showing bilateral basilar PNA. At 24-36 hrs into hospitalization, he looked and felt worse, and  labs got worse. He had hemoptysis. Pulm and ID consulted. He had bacteremia from streptococcus  pneumoniae. HIV negative. Started ceftriaxone 2g IV. Had PICC placed for ceftriaxone. A1c noted to  be 15. He had ARF thought due to contrast nephropathy, urinary retention but this resolved. He had  abnormal LFT's with possible cholecystitis on u/s. Surgery consulted, HIDA scan done, was egative,  LFT's downtrended. Hgb dropped from 10 to 7 through admission, felt due to acute illness. Followed  up with Dr. Linus Salmons 4/1, had PICC removed s/p 2wks ceftriaxone.   Pt is reliable historian, states that overall he was slowly getting better at home until Thrusday  night developed severe, persistent cough with extremely painful right sided chest pain, worse with  breathing. His measured temp at home was 99.3. The next day he had an appt with PCP and was  recommended tylenol. Later on Friday he continued to have persistent painful cough productive of  brownish sputum, and got more nauseous and dizzy through the evening for which he presented.   In the ED, vitals were stable. Labs with hypoNa 133, stable renal 40/1.13, decrease in AlkP, but  increase in AST/ALT to 107/104, increase in Tbili 1.6. WBC overall downtrending to 19.6. Hgb  stable at 7.0. Bcx pending x2. CXR showed patchy RLL opacity suspicious for PNA with associated  small/moderate right pleural effusion and left basilar opacity  atelectasis vs PNA with small left  pleural effusion, and PICC has been removed. I recommended CT chest for empyema vs simple  effusion, which showed RUL/RLL PNA, moderate right pleural effusion, and patchy LLL opacity with  small left pleural effusion.   He also endorses RUQ pain to palpation. ROS is otherwise not remarkable other than above. Dr. Linus Salmons  referred him to [facility] for a painful right upper lip cyst that developed during last admission.     Past Medical History  Diagnosis Date  . Diabetes mellitus   . Pneumonia 02/2012    Strep pneumoniae bilateral pneumonia complicated by bacteremia  . Bacteremia     History reviewed. No pertinent past surgical history.  Medications:  HOME MEDS: Reconciled with pt  Prior to Admission medications   Medication Sig Start Date End Date Taking? Authorizing Provider  aspirin 81 MG tablet Take 81 mg by mouth every morning.    Yes Historical Provider, MD  insulin aspart (NOVOLOG) 100 UNIT/ML injection Inject 11 Units into the skin 3 (three) times daily before meals. Based on sliding scale. Pt took 16 units today at 1600. 03/16/12 03/16/13 Yes Marianne L York, PA  insulin glargine (LANTUS) 100 UNIT/ML injection Inject 40 Units into the skin at bedtime. 03/16/12 03/16/13 Yes Marianne L York, PA  lisinopril (PRINIVIL,ZESTRIL) 5 MG tablet Take 5 mg by mouth every morning.    Yes Historical Provider, MD  Multiple Vitamin (MULITIVITAMIN WITH MINERALS) TABS Take 1 tablet by mouth every morning.   Yes Historical Provider, MD  rosuvastatin (CRESTOR) 10 MG tablet Take 10  mg by mouth every morning.    Yes Historical Provider, MD    Allergies:  Allergies  Allergen Reactions  . Penicillins Rash    Tolerating Ceftriaxone (03/12/12)    Social History:   reports that he has never smoked. He has never used smokeless tobacco. He reports that he does not drink alcohol or use illicit drugs. He is from [place] and lives at home here with his daughter. He is  divorced. 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   Family History: No family history on file.  Physical Exam: Filed Vitals:   03/28/12 0345 03/28/12 0427 03/28/12 0430 03/28/12 0639  BP: 119/70  122/79 118/77  Pulse: 88  95 85  Temp: 97.7 F (36.5 C)  98.2 F (36.8 C) 99 F (37.2 C)  TempSrc: Oral  Oral Oral  Resp: 16  16 18   Height:  6\' 2"  (1.88 m)    Weight:  87.7 kg (193 lb 5.5 oz)    SpO2: 93%  90% 90%   Blood pressure 118/77, pulse 85, temperature 99 F (37.2 C), temperature source Oral, resp. rate 18, height 6\' 2"  (1.88 m), weight 87.7 kg (193 lb 5.5 oz), SpO2 90.00%.  Gen: Tall, thin, ill but not floridly toxic appearing M in no distress, he does appear uncomfortable  and quite run down. Able to relate history well though. He appears pale.  HEENT: Pupils round, reactive, EOMI, sclera clear. Mouth with a notable left upper lip round mass.  Mouth dry appearing.  Lungs: Can't sit forward due to pain, so auscultated anterolaterally. Breathing causes him pain.  Decreased breath sounds noted at right base. Otherwise, moderately clear BS's Heart: Tachycardic, with S1/2 heard, but no m/g  Abd: Soft, not rigid, but with notable facial grimacing and exquisite tenderness in the RUQ, not on  the chest wall, but abdominal wall. BS positive Extrem: Upper extremities overall normal appearing, warm, perfusing well with palapble radials. BLE's  have very notable soft pitting edema, but are warm.  Neuro: Alert, attentive, conversant, moves extremities on his own. Grossly non-focal.    Labs & Imaging Results for orders placed during the hospital encounter of 03/27/12 (from the past 48 hour(s))  CBC     Status: Abnormal   Collection Time   03/28/12 12:07 AM      Component Value Range Comment   WBC 19.6 (*) 4.0 - 10.5 (K/uL)    RBC 2.31 (*) 4.22 - 5.81 (MIL/uL)    Hemoglobin 7.0 (*) 13.0 - 17.0 (g/dL)    HCT 21.2 (*) 39.0 - 52.0 (%)    MCV 91.8   78.0 - 100.0 (fL)    MCH 30.3  26.0 - 34.0 (pg)    MCHC 33.0  30.0 - 36.0 (g/dL)    RDW 15.6 (*) 11.5 - 15.5 (%)    Platelets 395  150 - 400 (K/uL)   COMPREHENSIVE METABOLIC PANEL     Status: Abnormal   Collection Time   03/28/12 12:07 AM      Component Value Range Comment   Sodium 133 (*) 135 - 145 (mEq/L)    Potassium 4.4  3.5 - 5.1 (mEq/L)    Chloride 98  96 - 112 (mEq/L)    CO2 25  19 - 32 (mEq/L)    Glucose, Bld 160 (*) 70 - 99 (mg/dL)    BUN 40 (*) 6 - 23 (mg/dL)    Creatinine, Ser 1.13  0.50 - 1.35 (  mg/dL)    Calcium 8.5  8.4 - 10.5 (mg/dL)    Total Protein 7.1  6.0 - 8.3 (g/dL)    Albumin 2.5 (*) 3.5 - 5.2 (g/dL)    AST 107 (*) 0 - 37 (U/L)    ALT 104 (*) 0 - 53 (U/L)    Alkaline Phosphatase 365 (*) 39 - 117 (U/L)    Total Bilirubin 1.6 (*) 0.3 - 1.2 (mg/dL)    GFR calc non Af Amer 77 (*) >90 (mL/min)    GFR calc Af Amer 89 (*) >90 (mL/min)   PRO B NATRIURETIC PEPTIDE     Status: Abnormal   Collection Time   03/28/12 12:08 AM      Component Value Range Comment   Pro B Natriuretic peptide (BNP) 178.6 (*) 0 - 125 (pg/mL)   POCT I-STAT TROPONIN I     Status: Normal   Collection Time   03/28/12 12:30 AM      Component Value Range Comment   Troponin i, poc 0.01  0.00 - 0.08 (ng/mL)    Comment 3            TYPE AND SCREEN     Status: Normal   Collection Time   03/28/12  1:00 AM      Component Value Range Comment   ABO/RH(D) O POS      Antibody Screen NEG      Sample Expiration 03/31/2012      Dg Chest 2 View  03/28/2012  *RADIOLOGY REPORT*  Clinical Data: Cough, right rib pain, emesis, recent pneumonia  CHEST - 2 VIEW  Comparison: 03/16/2012  Findings: Patchy right lower lobe opacity, suspicious for pneumonia, with associated small-to-moderate right pleural effusion.  Left basilar opacity, atelectasis versus pneumonia, with suspected small left pleural effusion.  No pneumothorax.  The heart is normal in size.  Interval removal of right subclavian PICC.  Visualized osseous  structures are within normal limits.  IMPRESSION: Patchy right lower lobe opacity, suspicious for pneumonia, with associated small-to-moderate right pleural effusion.  Left basilar opacity, atelectasis versus pneumonia, with suspected small left pleural effusion.  Original Report Authenticated By: Julian Hy, M.D.   Ct Chest Wo Contrast  03/28/2012  *RADIOLOGY REPORT*  Clinical Data: Cough, right-sided chest pain  CT CHEST WITHOUT CONTRAST  Technique:  Multidetector CT imaging of the chest was performed following the standard protocol without IV contrast.  Comparison: Chest radiographs dated 03/28/2012.  CT chest dated 03/11/2012.  Findings: Patchy posterior right upper lobe opacity and near complete right lower lobe consolidation, suspicious for pneumonia. Associated moderate right pleural effusion.  Patchy left lower lobe opacity, atelectasis versus pneumonia, with associated small left pleural effusion.  No pneumothorax.  Heart is normal in size.  Small pericardial effusion.  Visualized upper abdomen is unremarkable.  Mild degenerative changes of the visualized thoracolumbar spine.  IMPRESSION: Right upper/lower lobe pneumonia, with associated moderate right pleural effusion.  Patchy left lower lobe opacity, atelectasis versus pneumonia, with associated small left pleural effusion.  Original Report Authenticated By: Julian Hy, M.D.    Impression Present on Admission:  .Elevated LFTs .Hyponatremia .Diabetes mellitus .Community acquired pneumonia .Bacteremia due to Streptococcus pneumoniae .Acute kidney failure .Bilateral leg edema .Cyst of skin .Parapneumonic effusion .Anemia .Hypoalbuminemia .Total bilirubin, elevated  45yoM with h/o poorly controlled DM, recent admission for strep pneumoniae bacteremia presents with  cough, chest pain, and found to have new pleural effusions, worsening LFT's.   1. Bilateral strep pneumoniae PNA, now with bilateral R >  L pleural effusions: From  what I can tell,  prior CT chest showed bilateral basilar PNA, now pt appears to have RUL involvement and persistent  bibasilar involvement, and development of likely parapneumonic effusions. Overall, I am not sure if  the PNA's themselves are worse, given that his WBC count is better, and his stated Tmax was 99.3.  However it seems safer at this point to continue the ABx and get ID consultation.   His symptoms could be related to simple pleural effusions (and not worsened PNA requiring ABx) and  should be tapped.   - Continue broad spectrum ABx for now with vanc/zosyn, recommend ID consultation, consider going back  to just ceftriaxone (since he overall improved on this?). Blood cultures are pending.  - IR consultation for thoracentesis. Consider repeat pulmonary consultation.  - Holding on diuresing him for now given likely upcoming thora  2. Transaminitis and increased Tbili: There was suspicion for cholecystitis last admission but  negative HIDA scan. However, now his numbers are going back up and he's pretty exquisitely tender in  the RUQ (not the right chest wall).  - Abdominal u/s  3. HypoNa: Suspect hypervolemic hypoNa, but overall this is stable and mild, trending.   4. Anemia: Also stable, but low. Hgb 10 down to 7 during admission. Consider transfusion, although I  was not able to broach this with him given above issues.   5. Diabetes: A1c 15. Continue home lantus, add aggressive SSI. Continue home ASA 81, lisinopril,  statin  6. Right upper lip cyst: this developed during the admission. Not sure what this is. Consider  ultrasound to look for abscess?  7. Hypoalbuminemia: Nutrition consult  8. BLE edema: Not on home lasix. Will not give IVF's and will NOT give lasix for now but this should  be ordered after thoracentesis  SubQ heparin Regular bed, MC team 3 Presumed full code  Other plans as per orders.   Dijon Kohlman 03/28/2012, 6:54 AM

## 2012-03-28 NOTE — Progress Notes (Signed)
PROGRESS NOTE  [name] [name] [id number] DOB: 1966-05-13 DOA: 03/27/2012 PCP: [name], MD, MD Infectious Disease: Dr. Scharlene Gloss  Pulmonary: Dr. Baltazar Apo  Brief narrative: 46 year old man recently treated for strep coccus pneumoniae pneumonia and bacteremia. Presented with cough, right side chest pain.  Chart review:  03/11/2012-03/16/2012 hospitalization: Streptococcus pneumoniae a pneumonia with associated bacteremia, acute renal failure, elevated LFTs.  Past medical history: Diabetes mellitus  Antibiotics:  April 6: Vancomycin  April 6: Zosyn  Interim History: Chart reviewed in detail. Afebrile, vital signs stable. No hypoxia or tachypnea.  Subjective: Continues to have severe right chest pain. Some shortness of breath. Cough continues.  Objective: Filed Vitals:   03/28/12 0345 03/28/12 0427 03/28/12 0430 03/28/12 0639  BP: 119/70  122/79 118/77  Pulse: 88  95 85  Temp: 97.7 F (36.5 C)  98.2 F (36.8 C) 99 F (37.2 C)  TempSrc: Oral  Oral Oral  Resp: 16  16 18   Height:  6\' 2"  (1.88 m)    Weight:  87.7 kg (193 lb 5.5 oz)    SpO2: 93%  90% 90%   No intake or output data in the 24 hours ending 03/28/12 1001  Exam:   General:  Appears mildly uncomfortable.  ENT: Right corner of mouth had a small mass that appears to be a cyst. Inspection of the mouth itself reveals an irregular mass in the buccal mucosa. It is nontender to palpation and nonfluctuant.  Cardiovascular: Regular rate and rhythm. No murmur, rub, gallop. No lower extremity edema.  Respiratory: Clear to auscultation bilaterally. No wheezes, rales, rhonchi. Shallow inspirations limited examination.  Abdomen: Soft, nontender, nondistended. No right upper quadrant pain with deep palpation.  Musculoskeletal: Exquisite right chest wall tenderness with palpation in the midaxillary line.  Psychiatric: Grossly normal mood and affect. Speech fluent and appropriate  Data  Reviewed: Basic Metabolic Panel:  Lab [id number] 0007  NA 133*  K 4.4  CL 98  CO2 25  GLUCOSE 160*  BUN 40*  CREATININE 1.13  CALCIUM 8.5  MG --  PHOS --   Liver Function Tests:  Lab 03/28/12 0007  AST 107*  ALT 104*  ALKPHOS 365*  BILITOT 1.6*  PROT 7.1  ALBUMIN 2.5*   CBC:  Lab 03/28/12 0007  WBC 19.6*  NEUTROABS --  HGB 7.0*  HCT 21.2*  MCV 91.8  PLT 395   Studies: Dg Chest 2 View  03/28/2012  *RADIOLOGY REPORT*  Clinical Data: Cough, right rib pain, emesis, recent pneumonia  CHEST - 2 VIEW  Comparison: 03/16/2012  Findings: Patchy right lower lobe opacity, suspicious for pneumonia, with associated small-to-moderate right pleural effusion.  Left basilar opacity, atelectasis versus pneumonia, with suspected small left pleural effusion.  No pneumothorax.  The heart is normal in size.  Interval removal of right subclavian PICC.  Visualized osseous structures are within normal limits.  IMPRESSION: Patchy right lower lobe opacity, suspicious for pneumonia, with associated small-to-moderate right pleural effusion.  Left basilar opacity, atelectasis versus pneumonia, with suspected small left pleural effusion.  Original Report Authenticated By: Julian Hy, M.D.   Ct Chest Wo Contrast  03/28/2012  *RADIOLOGY REPORT*  Clinical Data: Cough, right-sided chest pain  CT CHEST WITHOUT CONTRAST  Technique:  Multidetector CT imaging of the chest was performed following the standard protocol without IV contrast.  Comparison: Chest radiographs dated 03/28/2012.  CT chest dated 03/11/2012.  Findings: Patchy posterior right upper lobe opacity and near complete right lower lobe consolidation, suspicious for pneumonia. Associated moderate right  pleural effusion.  Patchy left lower lobe opacity, atelectasis versus pneumonia, with associated small left pleural effusion.  No pneumothorax.  Heart is normal in size.  Small pericardial effusion.  Visualized upper abdomen is unremarkable.  Mild  degenerative changes of the visualized thoracolumbar spine.  IMPRESSION: Right upper/lower lobe pneumonia, with associated moderate right pleural effusion.  Patchy left lower lobe opacity, atelectasis versus pneumonia, with associated small left pleural effusion.  Original Report Authenticated By: Julian Hy, M.D.   Scheduled Meds:   . aspirin  81 mg Oral Daily  . atorvastatin  20 mg Oral q1800  . docusate sodium  100 mg Oral BID  . heparin  5,000 Units Subcutaneous Q8H  . insulin glargine  40 Units Subcutaneous QHS  . lisinopril  5 mg Oral Daily  .  morphine injection  4 mg Intravenous Once  . morphine      . ondansetron  4 mg Intravenous Once  . piperacillin-tazobactam (ZOSYN)  IV  3.375 g Intravenous Once  . piperacillin-tazobactam (ZOSYN)  IV  3.375 g Intravenous Q8H  . senna  1 tablet Oral BID  . sodium chloride  3 mL Intravenous Q12H  . vancomycin  1,000 mg Intravenous Once  . vancomycin  1,000 mg Intravenous Q8H  . DISCONTD: sodium chloride   Intravenous STAT  . DISCONTD: aspirin  81 mg Oral BH-q7a   Continuous Infusions:    Assessment/Plan: 1. Bilateral pleural effusions, right greater than left: Not amenable to thoracentesis secondary to small size on ultrasound. Pleural effusion is likely the cause of the patient's pain. He is afebrile, has no hypoxia and leukocytosis is actually markedly improved. 2. Recent pneumonia with pleural effusions: Chest CT again shows right lower lobe pneumonia with some right upper lobe patchiness. Not clear whether this represents new/persistent infection versus lag from recent treatment. Continue empiric anabiotic therapy for now. Consider pulmonology and infectious disease consultation but for now will defer.  3. History of strep pneumoniae pneumonia 4. Transaminitis: Etiology not clear however ultrasound unremarkable, hepatitis panel negative and recent HIDA scan was negative. Likely related to effusion. 5. Hyponatremia: Stable. Likely  related to recent pneumonia. 6. Anemia: Secondary to acute illness. Follow. Transfuse for hemoglobin less than 7.0.  7. Diabetes mellitus type 2, uncontrolled: Sliding scale insulin. Lantus.  8. Right buccal mucosa mass: Outpatient followup with oral surgery or ENT.    Code Status: Full code  Family Communication: None, per patient request  Disposition Plan: Home when improved.   Murray Hodgkins, MD  [facility] Pager [phone]  If 7PM-7AM, please contact night-coverage [email or web address] Password [email or web address] 03/28/2012, 10:01 AM    LOS: 1 day

## 2012-03-29 LAB — GLUCOSE, CAPILLARY
Glucose-Capillary: 308 mg/dL — ABNORMAL HIGH (ref 70–99)
Glucose-Capillary: 378 mg/dL — ABNORMAL HIGH (ref 70–99)
Glucose-Capillary: 197 mg/dL — ABNORMAL HIGH (ref 70–99)
Glucose-Capillary: 185 mg/dL — ABNORMAL HIGH (ref 70–99)

## 2012-03-29 LAB — VANCOMYCIN, TROUGH: Vancomycin Tr: 21.3 ug/mL — ABNORMAL HIGH (ref 10.0–20.0)

## 2012-03-29 LAB — COMPREHENSIVE METABOLIC PANEL
ALT: 69 U/L — ABNORMAL HIGH (ref 0–53)
AST: 41 U/L — ABNORMAL HIGH (ref 0–37)
Albumin: 2 g/dL — ABNORMAL LOW (ref 3.5–5.2)
Alkaline Phosphatase: 347 U/L — ABNORMAL HIGH (ref 39–117)
BUN: 41 mg/dL — ABNORMAL HIGH (ref 6–23)
CO2: 25 mEq/L (ref 19–32)
Calcium: 8.2 mg/dL — ABNORMAL LOW (ref 8.4–10.5)
Chloride: 96 mEq/L (ref 96–112)
Creatinine, Ser: 1.25 mg/dL (ref 0.50–1.35)
GFR calc Af Amer: 79 mL/min — ABNORMAL LOW (ref >90)
GFR calc non Af Amer: 68 mL/min — ABNORMAL LOW (ref >90)
Glucose, Bld: 334 mg/dL — ABNORMAL HIGH (ref 70–99)
Potassium: 4.3 mEq/L (ref 3.5–5.1)
Sodium: 130 mEq/L — ABNORMAL LOW (ref 135–145)
Total Bilirubin: 0.6 mg/dL (ref 0.3–1.2)
Total Protein: 6.6 g/dL (ref 6.0–8.3)

## 2012-03-29 LAB — IRON AND TIBC
Iron: <10 ug/dL — ABNORMAL LOW (ref 42–135)
UIBC: 189 ug/dL (ref 125–400)

## 2012-03-29 LAB — RETICULOCYTES
RBC.: 2.16 MIL/uL — ABNORMAL LOW (ref 4.22–5.81)
Retic Count, Absolute: 121 10*3/uL (ref 19.0–186.0)
Retic Ct Pct: 5.6 % — ABNORMAL HIGH (ref 0.4–3.1)

## 2012-03-29 LAB — CBC
HCT: 18.6 % — ABNORMAL LOW (ref 39.0–52.0)
Hemoglobin: 5.9 g/dL — CL (ref 13.0–17.0)
MCH: 29.2 pg (ref 26.0–34.0)
MCHC: 31.7 g/dL (ref 30.0–36.0)
MCV: 92.1 fL (ref 78.0–100.0)
Platelets: 327 10*3/uL (ref 150–400)
RBC: 2.02 MIL/uL — ABNORMAL LOW (ref 4.22–5.81)
RDW: 14.9 % (ref 11.5–15.5)
WBC: 13.7 10*3/uL — ABNORMAL HIGH (ref 4.0–10.5)

## 2012-03-29 LAB — FOLATE: Folate: 12.8 ng/mL

## 2012-03-29 LAB — HEMOGLOBIN AND HEMATOCRIT, BLOOD
HCT: 22.2 % — ABNORMAL LOW (ref 39.0–52.0)
Hemoglobin: 7.4 g/dL — ABNORMAL LOW (ref 13.0–17.0)

## 2012-03-29 LAB — PREPARE RBC (CROSSMATCH)

## 2012-03-29 LAB — FERRITIN: Ferritin: 906 ng/mL — ABNORMAL HIGH (ref 22–322)

## 2012-03-29 LAB — VITAMIN B12: Vitamin B-12: 937 pg/mL — ABNORMAL HIGH (ref 211–911)

## 2012-03-29 MED ORDER — ACETAMINOPHEN 325 MG PO TABS
650.0000 mg | ORAL_TABLET | Freq: Once | ORAL | Status: AC
  Administered 2012-03-29: 650 mg via ORAL
  Filled 2012-03-29: qty 2

## 2012-03-29 MED ORDER — DIPHENHYDRAMINE HCL 25 MG PO CAPS
25.0000 mg | ORAL_CAPSULE | Freq: Once | ORAL | Status: AC
  Administered 2012-03-29: 25 mg via ORAL
  Filled 2012-03-29: qty 1

## 2012-03-29 MED ORDER — INSULIN ASPART 100 UNIT/ML ~~LOC~~ SOLN
11.0000 [IU] | Freq: Three times a day (TID) | SUBCUTANEOUS | Status: DC
  Administered 2012-03-29 (×2): 11 [IU] via SUBCUTANEOUS
  Administered 2012-03-30: 12:00:00 via SUBCUTANEOUS
  Administered 2012-03-30 – 2012-04-01 (×2): 11 [IU] via SUBCUTANEOUS

## 2012-03-29 NOTE — Progress Notes (Signed)
PROGRESS NOTE  [name] [name] [id number] DOB: Feb 15, 1966 DOA: 03/27/2012 PCP: [name], MD, MD Infectious Disease: Dr. Scharlene Gloss  Pulmonary: Dr. Baltazar Apo  Brief narrative: 46 year old man recently treated for strep coccus pneumoniae pneumonia and bacteremia. Presented with cough, right side chest pain.  Chart review:  03/11/2012-03/16/2012 hospitalization: Streptococcus pneumoniae a pneumonia with associated bacteremia, acute renal failure, elevated LFTs.  Past medical history: Diabetes mellitus  Antibiotics:  April 6: Vancomycin  April 6: Zosyn  Interim History: Interval documentation reviewed. Hemoglobin 5.9 today.  Subjective: Poor sleep last night secondary to discomfort in the bed. Continues to have significant amount of right-sided chest pain with cough. Cough continues.  Objective: Filed Vitals:   03/28/12 1442 03/28/12 1454 03/28/12 2110 03/29/12 0557  BP: 129/76  125/77 103/69  Pulse: 96  95 86  Temp: 98.9 F (37.2 C)  99.4 F (37.4 C) 98.1 F (36.7 C)  TempSrc:   Oral Oral  Resp: 20  20 20   Height:      Weight:      SpO2: 88% 97% 92% 93%    Intake/Output Summary (Last 24 hours) at 03/29/12 0916 Last data filed at 03/28/12 2202  Gross per 24 hour  Intake   1450 ml  Output   2250 ml  Net   -800 ml    Exam:   General:  Appears mildly uncomfortable. Sitting in chair.  ENT: No change in soft tissue mass right buccal mucosa.  Cardiovascular: Regular rate and rhythm. No murmur, rub, gallop. No change in extremity edema (2+).  Respiratory: Clear to auscultation bilaterally. No wheezes, rales, rhonchi. Shallow inspirations limited examination.  Abdomen: Soft, nontender, nondistended.   Musculoskeletal: No change in right-sided chest pain.  Psychiatric: Grossly normal mood and affect. Speech fluent and appropriate  Data Reviewed: Basic Metabolic Panel:  Lab [id number] 0555 03/28/12 0007  NA 130* 133*  K 4.3 4.4  CL 96 98    CO2 25 25  GLUCOSE 334* 160*  BUN 41* 40*  CREATININE 1.25 1.13  CALCIUM 8.2* 8.5  MG -- --  PHOS -- --   Liver Function Tests:  Lab 03/29/12 0555 03/28/12 0007  AST 41* 107*  ALT 69* 104*  ALKPHOS 347* 365*  BILITOT 0.6 1.6*  PROT 6.6 7.1  ALBUMIN 2.0* 2.5*   CBC:  Lab 03/29/12 0555 03/28/12 0007  WBC 13.7* 19.6*  NEUTROABS -- --  HGB 5.9* 7.0*  HCT 18.6* 21.2*  MCV 92.1 91.8  PLT 327 395   Studies: Dg Chest 2 View  03/28/2012  *RADIOLOGY REPORT*  Clinical Data: Cough, right rib pain, emesis, recent pneumonia  CHEST - 2 VIEW  Comparison: 03/16/2012  Findings: Patchy right lower lobe opacity, suspicious for pneumonia, with associated small-to-moderate right pleural effusion.  Left basilar opacity, atelectasis versus pneumonia, with suspected small left pleural effusion.  No pneumothorax.  The heart is normal in size.  Interval removal of right subclavian PICC.  Visualized osseous structures are within normal limits.  IMPRESSION: Patchy right lower lobe opacity, suspicious for pneumonia, with associated small-to-moderate right pleural effusion.  Left basilar opacity, atelectasis versus pneumonia, with suspected small left pleural effusion.  Original Report Authenticated By: Julian Hy, M.D.   Ct Chest Wo Contrast  03/28/2012  *RADIOLOGY REPORT*  Clinical Data: Cough, right-sided chest pain  CT CHEST WITHOUT CONTRAST  Technique:  Multidetector CT imaging of the chest was performed following the standard protocol without IV contrast.  Comparison: Chest radiographs dated 03/28/2012.  CT chest dated 03/11/2012.  Findings: Patchy posterior right upper lobe opacity and near complete right lower lobe consolidation, suspicious for pneumonia. Associated moderate right pleural effusion.  Patchy left lower lobe opacity, atelectasis versus pneumonia, with associated small left pleural effusion.  No pneumothorax.  Heart is normal in size.  Small pericardial effusion.  Visualized upper  abdomen is unremarkable.  Mild degenerative changes of the visualized thoracolumbar spine.  IMPRESSION: Right upper/lower lobe pneumonia, with associated moderate right pleural effusion.  Patchy left lower lobe opacity, atelectasis versus pneumonia, with associated small left pleural effusion.  Original Report Authenticated By: Julian Hy, M.D.   Scheduled Meds:    . aspirin  81 mg Oral Daily  . atorvastatin  20 mg Oral q1800  . docusate sodium  100 mg Oral BID  . furosemide  20 mg Oral Daily  . heparin  5,000 Units Subcutaneous Q8H  . insulin aspart  0-9 Units Subcutaneous TID WC  . insulin glargine  40 Units Subcutaneous QHS  . lisinopril  5 mg Oral Daily  . piperacillin-tazobactam (ZOSYN)  IV  3.375 g Intravenous Q8H  . senna  1 tablet Oral BID  . sodium chloride  3 mL Intravenous Q12H  . vancomycin  1,000 mg Intravenous Q8H  . DISCONTD: insulin aspart  0-9 Units Subcutaneous TID WC   Continuous Infusions:    Assessment/Plan: 1. Bilateral pleural effusions, right greater than left: Not amenable to thoracentesis secondary to small size on ultrasound. Pleural effusion is likely the cause of the patient's pain. He is afebrile, has minimal hypoxia and leukocytosis continues to trend downward. 2. Recent pneumonia with pleural effusions: Chest CT again shows right lower lobe pneumonia with some right upper lobe patchiness. Not clear whether this represents new/persistent infection versus lag from recent treatment. Continue empiric anabiotic therapy for now. Will consider pulmonology and infectious disease consultation April 8.  3. History of strep pneumoniae pneumonia 4. Transaminitis: Trending downward. Etiology not clear however abdominal ultrasound unremarkable, hepatitis panel negative and recent HIDA scan was negative. Likely related to effusion. 5. Hyponatremia: Stable. Likely related to recent pneumonia. 6. Anemia: No history of bleeding. Check fecal occult blood. No previous  data prior to last hospitalization when he presented with hemoglobin of 10 which quickly decreased, probably from dilution. Likely secondary to  acute illness. Transfuse today. Check anemia panel first. No previous GI evaluation. We will ask GI to see. 7. Diabetes mellitus type 2, uncontrolled: Sliding scale insulin. Lantus. Resume your coverage. 8. Right buccal mucosa mass: Outpatient followup with oral surgery or ENT.     Code Status: Full code  Family Communication: None, per patient request  Disposition Plan: Home when improved.   Murray Hodgkins, MD  [facility] Pager [phone]  If 7PM-7AM, please contact night-coverage [email or web address] Password [email or web address] 03/29/2012, 9:16 AM    LOS: 2 days

## 2012-03-29 NOTE — Progress Notes (Signed)
PT Cancellation Note  Treatment cancelled today due to medical issues with patient which prohibited therapy. Pt currently has a Hgb of 5.9 Spoke with RN and they are giving 2 units of blood this morning/afternoon. Will attempt evaluation this afternoon/tomorrow.   Thank you, 03/29/2012 Ambrose Finland DPT PAGER: [phone] OFFICE: [phone]    Ambrose Finland 03/29/2012, 9:58 AM

## 2012-03-29 NOTE — Progress Notes (Signed)
[place] for: Vancomycin, Zosyn  Indication: rule out pneumonia   Patient Data:   Allergies: Allergies  Allergen Reactions  . Penicillins Rash    Tolerating Ceftriaxone (03/12/12)    Patient Measurements: Height: 6\' 2"  (188 cm) Weight: 193 lb 5.5 oz (87.7 kg) IBW/kg (Calculated) : 82.2   Vital Signs: Temp:  [98.1 F (36.7 C)-99.4 F (37.4 C)] 98.1 F (36.7 C) (04/07 0557) Pulse Rate:  [86-96] 86  (04/07 0557) Resp:  [20] 20  (04/07 0557) BP: (103-129)/(69-77) 103/69 mmHg (04/07 0557) SpO2:  [88 %-97 %] 93 % (04/07 0557) FiO2 (%):  [2 %] 2 % (04/06 2110)  Intake/Output from previous day:  Intake/Output Summary (Last 24 hours) at 03/29/12 1032 Last data filed at 03/28/12 2202  Gross per 24 hour  Intake   1450 ml  Output   2250 ml  Net   -800 ml    Labs:  Basename 03/29/12 0555 03/28/12 0007  WBC 13.7* 19.6*  HGB 5.9* 7.0*  PLT 327 395  LABCREA -- --  CREATININE 1.25 1.13   Estimated Creatinine Clearance: 86.8 ml/min (by C-G formula based on Cr of 1.25).  Basename 03/29/12 0852  VANCOTROUGH 21.3*  VANCOPEAK --  Jake Michaelis --  GENTTROUGH --  GENTPEAK --  GENTRANDOM --  TOBRATROUGH --  Shelah Lewandowsky --  TOBRARND --  AMIKACINPEAK --  AMIKACINTROU --  AMIKACIN --     Microbiology: Recent Results (from the past 720 hour(s))  CULTURE, BLOOD (ROUTINE X 2)     Status: Normal   Collection Time   03/11/12  1:00 PM      Component Value Range Status Comment   Specimen Description BLOOD RIGHT ANTECUBITAL   Final    Special Requests NONE BOTTLES DRAWN AEROBIC AND ANAEROBIC [facility]   Final    Culture  Setup Time [id number]   Final    Culture     Final    Value: STREPTOCOCCUS PNEUMONIAE     Note: SUSCEPTIBILITIES PERFORMED ON PREVIOUS CULTURE WITHIN THE LAST 5 DAYS.     Note: Gram Stain Report Called to,Read Back By and Verified With: COURTNEY DRIGGERS @ [id number] 03/12/12 WICKN   Report Status 03/14/2012 FINAL   Final     CULTURE, BLOOD (ROUTINE X 2)     Status: Normal   Collection Time   03/11/12  2:10 PM      Component Value Range Status Comment   Specimen Description BLOOD RIGHT HAND   Final    Special Requests NONE BOTTLES DRAWN AEROBIC AND ANAEROBIC [facility]   Final    Culture  Setup Time [id number]   Final    Culture     Final    Value: GRAM POSITIVE COCCI IN PAIRS     STREPTOCOCCUS PNEUMONIAE     Note: Gram Stain Report Called to,Read Back By and Verified With: COURTNEY DRIGGERS @ [id number] 03/12/12 WICKN   Report Status 03/14/2012 FINAL   Final    Organism ID, Bacteria STREPTOCOCCUS PNEUMONIAE   Final   CULTURE, BLOOD (ROUTINE X 2)     Status: Normal   Collection Time   03/15/12  1:15 PM      Component Value Range Status Comment   Specimen Description BLOOD RIGHT ARM   Final    Special Requests BOTTLES DRAWN AEROBIC AND ANAEROBIC 10CC   Final    Culture  Setup Time [id number]   Final    Culture NO GROWTH 5 DAYS  Final    Report Status 03/21/2012 FINAL   Final   CULTURE, BLOOD (ROUTINE X 2)     Status: Normal   Collection Time   03/15/12  1:20 PM      Component Value Range Status Comment   Specimen Description BLOOD LEFT ARM   Final    Special Requests BOTTLES DRAWN AEROBIC AND ANAEROBIC 10CC   Final    Culture  Setup Time [id number]   Final    Culture NO GROWTH 5 DAYS   Final    Report Status 03/21/2012 FINAL   Final   MRSA PCR SCREENING     Status: Normal   Collection Time   03/16/12 11:50 AM      Component Value Range Status Comment   MRSA by PCR NEGATIVE  NEGATIVE  Final   CULTURE, BLOOD (ROUTINE X 2)     Status: Normal (Preliminary result)   Collection Time   03/28/12 12:15 AM      Component Value Range Status Comment   Specimen Description BLOOD LEFT ARM   Final    Special Requests BOTTLES DRAWN AEROBIC AND ANAEROBIC 10CC   Final    Culture  Setup Time [id number]   Final    Culture     Final    Value:        BLOOD CULTURE RECEIVED NO GROWTH TO DATE CULTURE WILL BE HELD FOR 5  DAYS BEFORE ISSUING A FINAL NEGATIVE REPORT   Report Status PENDING   Incomplete   CULTURE, BLOOD (ROUTINE X 2)     Status: Normal (Preliminary result)   Collection Time   03/28/12 12:25 AM      Component Value Range Status Comment   Specimen Description BLOOD RIGHT ARM   Final    Special Requests BOTTLES DRAWN AEROBIC ONLY 10CC   Final    Culture  Setup Time [id number]   Final    Culture     Final    Value:        BLOOD CULTURE RECEIVED NO GROWTH TO DATE CULTURE WILL BE HELD FOR 5 DAYS BEFORE ISSUING A FINAL NEGATIVE REPORT   Report Status PENDING   Incomplete     Medical History: Past Medical History  Diagnosis Date  . Diabetes mellitus   . Pneumonia 02/2012    Strep pneumoniae bilateral pneumonia complicated by bacteremia  . Bacteremia     Scheduled Medications:     . acetaminophen  650 mg Oral Once  . aspirin  81 mg Oral Daily  . atorvastatin  20 mg Oral q1800  . diphenhydrAMINE  25 mg Oral Once  . docusate sodium  100 mg Oral BID  . furosemide  20 mg Oral Daily  . heparin  5,000 Units Subcutaneous Q8H  . insulin aspart  0-9 Units Subcutaneous TID WC  . insulin aspart  11 Units Subcutaneous TID AC  . insulin glargine  40 Units Subcutaneous QHS  . lisinopril  5 mg Oral Daily  . piperacillin-tazobactam (ZOSYN)  IV  3.375 g Intravenous Q8H  . senna  1 tablet Oral BID  . sodium chloride  3 mL Intravenous Q12H  . vancomycin  1,000 mg Intravenous Q8H  . DISCONTD: insulin aspart  0-9 Units Subcutaneous TID WC      Assessment:  46 y.o. male admitted on 03/27/2012, with pneumonia. Pharmacy consulted to manage vancomycin and Zosyn. Patient with documented penicillin allergy (rash), but patient received Zosyn and vancomycin in ED without adverse reaction.  Pharmacy System-Based Medication Review: Anticoagulation: sq hep, CBC stable Infectious Diseases: PNA, bilateral pleural effusions; had S. pneumo bacteremia in March treated with 2wks of IV Rocephin. Afebrile, WBC  declining  Vanc 4/6 >> {4/7 trough at 7h 21.40mcg/ml, no change) Zosyn 4/6 >>  Blood x 2 4/6   Pulmonary: bilateral pleural effusions, unable to do thoracentesis due to small effusions Cardiovascular: DM risk reduction:  ASA/Statin/Lisinopril, lasix 20 daily Gastrointestinal: LFTs up, possible cholesystitis Endocrinology: hx DM - on Lantus,SSI Fluids/Electrolytes/Nutrition: lytes ok Nephrology: Scr rising, good UOP Hematology/Oncology: Hgb 7 ->5.6, for 2 units today, for GI evaluation Best Practices: DVT Px: SQ Heparin - consider changing to SCDs in light of falling Hgb Unless otherwise clinically indicated. PTA Medication Problems: Home medications not ordered:  MVI   Goal of Therapy:  1. Vancomycin trough level 15-20 mcg/ml  Plan:  1. Continue Vancomycin 1g IV q8h 2. Follow renal function very closely, if SCr rises or UOP drops consider empiric frequency decrease to q12h, with trough at [facility]. Follow up SCr, UOP, cultures, clinical course and adjust as clinically indicated.   Rowe Robert Pharm.D., BCPS Clinical Pharmacist 03/29/2012 10:34 AM Pager: [phone] Phone: [phone]

## 2012-03-29 NOTE — Progress Notes (Signed)
Received call from lab about pt's hemoglobin of 5.9 at 0658. Paged Dr Sarajane Jews at [phone] and received call back at 0703. No new orders received at this time. Will continue to monitor pt closely.

## 2012-03-30 DIAGNOSIS — R609 Edema, unspecified: Secondary | ICD-10-CM

## 2012-03-30 DIAGNOSIS — R0602 Shortness of breath: Secondary | ICD-10-CM

## 2012-03-30 DIAGNOSIS — R7881 Bacteremia: Secondary | ICD-10-CM

## 2012-03-30 DIAGNOSIS — N179 Acute kidney failure, unspecified: Secondary | ICD-10-CM

## 2012-03-30 DIAGNOSIS — J90 Pleural effusion, not elsewhere classified: Secondary | ICD-10-CM

## 2012-03-30 DIAGNOSIS — B953 Streptococcus pneumoniae as the cause of diseases classified elsewhere: Secondary | ICD-10-CM

## 2012-03-30 DIAGNOSIS — E119 Type 2 diabetes mellitus without complications: Secondary | ICD-10-CM

## 2012-03-30 LAB — COMPREHENSIVE METABOLIC PANEL
ALT: 78 U/L — ABNORMAL HIGH (ref 0–53)
AST: 52 U/L — ABNORMAL HIGH (ref 0–37)
Albumin: 1.9 g/dL — ABNORMAL LOW (ref 3.5–5.2)
Alkaline Phosphatase: 494 U/L — ABNORMAL HIGH (ref 39–117)
BUN: 36 mg/dL — ABNORMAL HIGH (ref 6–23)
CO2: 25 mEq/L (ref 19–32)
Calcium: 8.3 mg/dL — ABNORMAL LOW (ref 8.4–10.5)
Chloride: 97 mEq/L (ref 96–112)
Creatinine, Ser: 1.15 mg/dL (ref 0.50–1.35)
GFR calc Af Amer: 87 mL/min — ABNORMAL LOW (ref >90)
GFR calc non Af Amer: 75 mL/min — ABNORMAL LOW (ref >90)
Glucose, Bld: 190 mg/dL — ABNORMAL HIGH (ref 70–99)
Potassium: 4.2 mEq/L (ref 3.5–5.1)
Sodium: 130 mEq/L — ABNORMAL LOW (ref 135–145)
Total Bilirubin: 0.6 mg/dL (ref 0.3–1.2)
Total Protein: 6.3 g/dL (ref 6.0–8.3)

## 2012-03-30 LAB — CBC
HCT: 20.8 % — ABNORMAL LOW (ref 39.0–52.0)
Hemoglobin: 6.9 g/dL — CL (ref 13.0–17.0)
MCH: 29.5 pg (ref 26.0–34.0)
MCHC: 33.2 g/dL (ref 30.0–36.0)
MCV: 88.9 fL (ref 78.0–100.0)
Platelets: 299 10*3/uL (ref 150–400)
RBC: 2.34 MIL/uL — ABNORMAL LOW (ref 4.22–5.81)
RDW: 15.1 % (ref 11.5–15.5)
WBC: 10.6 10*3/uL — ABNORMAL HIGH (ref 4.0–10.5)

## 2012-03-30 LAB — GLUCOSE, CAPILLARY
Glucose-Capillary: 96 mg/dL (ref 70–99)
Glucose-Capillary: 233 mg/dL — ABNORMAL HIGH (ref 70–99)
Glucose-Capillary: 197 mg/dL — ABNORMAL HIGH (ref 70–99)
Glucose-Capillary: 175 mg/dL — ABNORMAL HIGH (ref 70–99)

## 2012-03-30 MED ORDER — DIPHENHYDRAMINE HCL 25 MG PO CAPS
25.0000 mg | ORAL_CAPSULE | Freq: Once | ORAL | Status: AC
  Administered 2012-03-30: 25 mg via ORAL
  Filled 2012-03-30: qty 22

## 2012-03-30 MED ORDER — POLYETHYLENE GLYCOL 3350 17 G PO PACK
68.0000 g | PACK | Freq: Once | ORAL | Status: AC
  Administered 2012-03-30: 68 g via ORAL
  Filled 2012-03-30: qty 4

## 2012-03-30 MED ORDER — ACETAMINOPHEN 325 MG PO TABS
650.0000 mg | ORAL_TABLET | Freq: Once | ORAL | Status: AC
  Administered 2012-03-30: 650 mg via ORAL
  Filled 2012-03-30: qty 2

## 2012-03-30 MED ORDER — FUROSEMIDE 10 MG/ML IJ SOLN
20.0000 mg | Freq: Two times a day (BID) | INTRAMUSCULAR | Status: AC
  Administered 2012-03-30 – 2012-04-01 (×4): 20 mg via INTRAVENOUS
  Filled 2012-03-30 (×4): qty 2

## 2012-03-30 NOTE — Progress Notes (Addendum)
PROGRESS NOTE  [name] [name] [id number] DOB: 07/12/1966 DOA: 03/27/2012 PCP: [name], MD, MD Infectious Disease: Dr. Scharlene Gloss  Pulmonary: Dr. Baltazar Apo  Brief narrative: 46 year old man recently treated for streptococcus pneumoniae bacteremia and pneumonia. Presented with cough, right-sided chest pain.  Chart review:  03/11/2012-03/16/2012 hospitalization: Streptococcus pneumoniae bacteremia, pneumonia, acute renal failure, elevated LFTs.  Past medical history: Diabetes mellitus  Antibiotics:  April 6: Vancomycin  April 6: Zosyn  Interim History: Interval documentation reviewed. Afebrile. Minimal hypoxia. Hemoglobin 6.9. White blood cell count decreased to 10.6. LFT elevation persists.  Subjective: Slept much better. Still has exquisite right-sided chest pain with cough. Not particularly short of breath.  Objective: Filed Vitals:   03/29/12 1500 03/29/12 2009 03/30/12 0511 03/30/12 0514  BP: 115/76 119/74 141/90 129/84  Pulse: 88 95 95 98  Temp: 97.9 F (36.6 C) 98.7 F (37.1 C) 98.6 F (37 C) 98.5 F (36.9 C)  TempSrc: Oral Oral Oral Oral  Resp: 18 18 18 18   Height:      Weight:      SpO2:  94% 94% 94%    Intake/Output Summary (Last 24 hours) at 03/30/12 0820 Last data filed at 03/30/12 0248  Gross per 24 hour  Intake   1330 ml  Output   1700 ml  Net   -370 ml    Exam:   General:  Appears more comfortable.   ENT: No change in soft tissue mass right buccal mucosa.  Cardiovascular: Regular rate and rhythm. No murmur, rub, gallop. No change in extremity edema (2+).  Respiratory: Clear to auscultation bilaterally. No wheezes, rales, rhonchi. Shallow inspirations limit examination.  Abdomen: Soft, nontender, nondistended. No right upper quadrant tenderness.  Musculoskeletal: No change in right-sided chest pain.  Psychiatric: Grossly normal mood and affect. Speech fluent and appropriate  Data Reviewed: Basic Metabolic  Panel:  Lab 0000000 0530 03/29/12 0555 03/28/12 0007  NA 130* 130* 133*  K 4.2 4.3 --  CL 97 96 98  CO2 25 25 25   GLUCOSE 190* 334* 160*  BUN 36* 41* 40*  CREATININE 1.15 1.25 1.13  CALCIUM 8.3* 8.2* 8.5  MG -- -- --  PHOS -- -- --   Liver Function Tests:  Lab 03/30/12 0530 03/29/12 0555 03/28/12 0007  AST 52* 41* 107*  ALT 78* 69* 104*  ALKPHOS 494* 347* 365*  BILITOT 0.6 0.6 1.6*  PROT 6.3 6.6 7.1  ALBUMIN 1.9* 2.0* 2.5*   CBC:  Lab 03/30/12 0530 03/29/12 1614 03/29/12 0555 03/28/12 0007  WBC 10.6* -- 13.7* 19.6*  NEUTROABS -- -- -- --  HGB 6.9* 7.4* 5.9* 7.0*  HCT 20.8* 22.2* 18.6* 21.2*  MCV 88.9 -- 92.1 91.8  PLT 299 -- 327 395    Basename 03/30/12 0657 03/29/12 2205 03/29/12 1622 03/29/12 1144 03/29/12 0647 03/28/12 2123  GLUCAP 96 185* 197* 378* 308* 295*    Studies: Ct Chest Wo Contrast  03/28/2012  *RADIOLOGY REPORT*  Clinical Data: Cough, right-sided chest pain  CT CHEST WITHOUT CONTRAST  Technique:  Multidetector CT imaging of the chest was performed following the standard protocol without IV contrast.  Comparison: Chest radiographs dated 03/28/2012.  CT chest dated 03/11/2012.  Findings: Patchy posterior right upper lobe opacity and near complete right lower lobe consolidation, suspicious for pneumonia. Associated moderate right pleural effusion.  Patchy left lower lobe opacity, atelectasis versus pneumonia, with associated small left pleural effusion.  No pneumothorax.  Heart is normal in size.  Small pericardial effusion.  Visualized upper abdomen  is unremarkable.  Mild degenerative changes of the visualized thoracolumbar spine.  IMPRESSION: Right upper/lower lobe pneumonia, with associated moderate right pleural effusion.  Patchy left lower lobe opacity, atelectasis versus pneumonia, with associated small left pleural effusion.  Original Report Authenticated By: Julian Hy, M.D.   Scheduled Meds:    . acetaminophen  650 mg Oral Once  . atorvastatin   20 mg Oral q1800  . diphenhydrAMINE  25 mg Oral Once  . docusate sodium  100 mg Oral BID  . furosemide  20 mg Oral Daily  . insulin aspart  0-9 Units Subcutaneous TID WC  . insulin aspart  11 Units Subcutaneous TID AC  . insulin glargine  40 Units Subcutaneous QHS  . lisinopril  5 mg Oral Daily  . piperacillin-tazobactam (ZOSYN)  IV  3.375 g Intravenous Q8H  . senna  1 tablet Oral BID  . sodium chloride  3 mL Intravenous Q12H  . vancomycin  1,000 mg Intravenous Q8H  . DISCONTD: aspirin  81 mg Oral Daily  . DISCONTD: heparin  5,000 Units Subcutaneous Q8H   Continuous Infusions:    Assessment/Plan: 1. Bilateral pleural effusions, right greater than left: Not amenable to thoracentesis secondary to small size on ultrasound. Pleural effusion may be etiology of pain. He is afebrile, has minimal hypoxia and leukocytosis continues to trend downward. 2. Recent pneumonia with pleural effusions: Chest CT again shows right lower lobe pneumonia with some right upper lobe patchiness. Not clear whether this represents new/persistent infection versus lag from recent treatment. Continue empiric antibiotic therapy for now. Pulmonary consultation for further recommendations and possible bronchoscopy. 3. History of strep pneumoniae pneumonia 4. Mixed pattern elevated LFTs: Etiology not clear however abdominal ultrasound unremarkable, hepatitis panel negative and recent HIDA scan was negative. Possibly related to effusion. 5. Hyponatremia: Stable. Likely related to recent pneumonia/pulmonary process. 6. Anemia: No history of bleeding. Check fecal occult blood. No previous data prior to last hospitalization when he presented with hemoglobin of 10 which quickly decreased, probably from dilution. Certainly a component of this is likely to be secondary to acute illness but I am skeptical that acute illness totally accounts for it. Transfuse additional units of packed or blood cells today. We will ask GI to  see. 7. Diabetes mellitus type 2, uncontrolled: Improved control. Continue sliding scale insulin, Lantus, meal coverage. 8. Right buccal mucosa mass: Outpatient followup with oral surgery or ENT. This developed during his last hospitalization.  Concern for underlying malignancy--at this point it is difficult to tie together his multiple abnormalities. Appreciate pulmonology and gastroenterology recommendations.  Code Status: Full code  Family Communication: None, per patient request  Disposition Plan: Home when improved.   Murray Hodgkins, MD  [facility] Pager [phone]  If 7PM-7AM, please contact night-coverage [email or web address] Password [email or web address] 03/30/2012, 8:20 AM    LOS: 3 days

## 2012-03-30 NOTE — Consult Note (Signed)
Patient: [name] [name] DOB: December 31, 1965 Date of Admission: 03/27/2012 PCP - [name], MD            Pulmonary consult  Date of Consult: 03/30/2012 MD requesting consult:  [facility] Sarajane Jews) Reason for consult: Recurrent PNA  HPI - 46yo male with hx poorly controlled DM with recent admission for PNA and strep bacteremia c/b acute renal failure. He was d/c 3/25 with PICC for Ceftriaxone through 4/2.  He returned 4/6 with cough, SOB and R sided pleuritic chest pain.  Re-admitted by [facility] with recurrent PNA, R>L pleural effusions, DM and anemia.  PCCM consulted 4/8 for assist and ?FOB.   Allergies:  Allergies  Allergen Reactions  . Penicillins Rash    Tolerating Ceftriaxone (03/12/12)     PMH: Past Medical History  Diagnosis Date  . Diabetes mellitus   . Pneumonia 02/2012    Strep pneumoniae bilateral pneumonia complicated by bacteremia  . Bacteremia     Home meds: Prior to Admission medications   Medication  Sig  Start Date  End Date  Taking?  Authorizing Provider   aspirin 81 MG tablet  Take 81 mg by mouth every morning.    Yes  Historical Provider, MD   insulin aspart (NOVOLOG) 100 UNIT/ML injection  Inject 11 Units into the skin 3 (three) times daily before meals. Based on sliding scale. Pt took 16 units today at 1600.  03/16/12  03/16/13  Yes  Marianne L York, PA   insulin glargine (LANTUS) 100 UNIT/ML injection  Inject 40 Units into the skin at bedtime.  03/16/12  03/16/13  Yes  Marianne L York, PA   lisinopril (PRINIVIL,ZESTRIL) 5 MG tablet  Take 5 mg by mouth every morning.    Yes  Historical Provider, MD   Multiple Vitamin (MULITIVITAMIN WITH MINERALS) TABS  Take 1 tablet by mouth every morning.    Yes  Historical Provider, MD   rosuvastatin (CRESTOR) 10 MG tablet  Take 10 mg by mouth every morning.    Yes  Historical Provider, MD       Social Hx: History   Social History  . Marital Status: Married    Spouse Name: N/A    Number of Children: N/A  . Years of  Education: N/A   Occupational History  . Not on file.   Social History Main Topics  . Smoking status: Never Smoker   . Smokeless tobacco: Never Used  . Alcohol Use: No  . Drug Use: No  . Sexually Active:    Other Topics Concern  . Not on file   Social History Narrative   He is from [place] and lives at home here with his daughter. He is divorced. 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      Family Hx: -- only family hx he knows of is DM.  His parents were killed in car accident when he was 5 and he was raised by his aunt and uncle.  No known autoimmune or pulmonary disease.  No family history on file.   ROS: C/o SOB, non productive cough and R sided pleuritic pain, worse with inspiration.  Also c/o BLE edema.  Denies other chest pain, hemoptysis.  No recent travel or sick contacts. Was hospitalized x 1 week late march.  All other systems reviewed and were neg.   Filed Vitals:   03/29/12 1500 03/29/12 2009 03/30/12 0511 03/30/12 0514  BP: 115/76 119/74 141/90 129/84  Pulse: 88  95 95 98  Temp: 97.9 F (36.6 C) 98.7 F (37.1 C) 98.6 F (37 C) 98.5 F (36.9 C)  TempSrc: Oral Oral Oral Oral  Resp: 18 18 18 18   Height:      Weight:      SpO2:  94% 94% 94%    chest X-ray [place] Chest  03/28/2012  *RADIOLOGY REPORT*  Clinical Data: 46 year old male with pneumonia and pleural effusions on chest CT.  Assess for thoracentesis.  CHEST ULTRASOUND  Comparison: Chest CT without contrast 03/28/2012 and earlier.  Findings: Gray-scale imaging of the right hemithorax demonstrates a small component of pleural fluid, with a larger adjacent component of consolidated lung.  No free fluid seen subjacent to the right hemidiaphragm.  The quantity of pleural fluid identified is insufficient for thoracentesis.  IMPRESSION: Right pleural effusion and larger component of right lung consolidation on ultrasound.  Pleural fluid component quantity insufficient for  thoracentesis at this time.  Original Report Authenticated By: Randall An, M.D.   US Abdomen Complete  03/28/2012  *RADIOLOGY REPORT*  Clinical Data:  Elevated LFTs.  Right upper quadrant pain and nausea.  ABDOMINAL ULTRASOUND COMPLETE  Comparison:  03/12/2012  Findings:  Gallbladder:  No gallstones, gallbladder wall thickening, or pericholecystic fluid. Negative sonographic Murphy's sign reported by the sonographer.  Common Bile Duct:  Within normal limits in caliber.  Liver: No focal mass lesion identified.  Within normal limits in parenchymal echogenicity.  IVC:  Appears normal.  Pancreas:  No abnormality identified.  Spleen:  Within normal limits in size and echotexture.  Right kidney:  Normal in size and parenchymal echogenicity.  No evidence of mass or hydronephrosis.  Left kidney:  Normal in size and parenchymal echogenicity.  No evidence of mass or hydronephrosis.  Abdominal Aorta:  No aneurysm identified.  Other findings:  Small bilateral pleural effusions.  Small amount of upper abdominal ascites posterior to the liver and spleen.  IMPRESSION:  1.  Small amount of abdominal ascites. 2.  Small pleural effusions.  Original Report Authenticated By: Angelita Ingles, M.D.    CT chest 4/6>>>Right upper/lower lobe pneumonia, with associated moderate right pleural effusion. Patchy left lower lobe opacity, atelectasis versus pneumonia, with associated small left pleural effusion.     CBC    Component Value Date/Time   WBC 10.6* 03/30/2012 0530   RBC 2.34* 03/30/2012 0530   HGB 6.9* 03/30/2012 0530   HCT 20.8* 03/30/2012 0530   PLT 299 03/30/2012 0530   MCV 88.9 03/30/2012 0530   MCH 29.5 03/30/2012 0530   MCHC 33.2 03/30/2012 0530   RDW 15.1 03/30/2012 0530     BMET    Component Value Date/Time   NA 130* 03/30/2012 0530   K 4.2 03/30/2012 0530   CL 97 03/30/2012 0530   CO2 25 03/30/2012 0530   GLUCOSE 190* 03/30/2012 0530   BUN 36* 03/30/2012 0530   CREATININE 1.15 03/30/2012 0530   CALCIUM 8.3* 03/30/2012  0530   GFRNONAA 75* 03/30/2012 0530   GFRAA 87* 03/30/2012 0530      EXAM: General: pleasant male, NAD HEENT: mm moist, R buccal mucosa mass Neuro: awake, alert, appropriate CV: s1s2 rrr PULM: resps even, non labored on Brice, diminished R>L base, few scattered ronchi GI: abd soft, non tender, +bs Extremities:  Warm and dry, 1-2+ BLE edema, pale  IMPRESSION/ PLAN:  1. Recurrent PNA -- Recent strep pneumoniae PNA and bacteremia.  D/c home 3/25 with cont IV ceftriaxone through 4/2 per ID.  Worsened  4/6.  Non resolving R sided PNA with associated effusion.  HIV neg last admit. No significant risk factors, never smoker.  However given mult abnormalities (transaminitis, significant anemia, etc) does increase concern for underlying malignancy and 20 lbs wt loss unexplained!  Aspiration event at home very likely!!  PLAN -  Cont IV abx (now on vanc, zosyn)  ID to see  Call ent to consider drain facial abscess?  ??underlying mass with post-obstructive PNA- not seen on CT however in BI but with clinical circumstance wt loss unexplained,. May need bronch Pending clinical course, may need FOB O2 F/u CXR in am  Lasix to neg balance  2. Pleural effusions -- likely parapneumonic.  Not large enough for thora on u/s eval by IR. Although I cannot see these images and I have some concern loculation PLAN -  Cont abx - see above PCCM will consider [place] and eval  lasix  3. Anemia - Per primary.  GI to see, hold Tx , lasix for heoconcetration prior 4. DM - per primary , does he need gastroparesis treatment, work up? 5. Transaminitis - per primary  6. Hyponatremia- eval for SIADH to consider and nhe appears overloaded as contributer Ensure guac stools by primary  Hold acei with diuresis Will follow p in am      [facility], NP 03/30/2012  10:45 AM Pager: [phone]  *Care during the described time interval was provided by me and/or other providers on the critical care team. I have  reviewed this patient's available data, including medical history, events of note, physical examination and test results as part of my evaluation.   Lavon Paganini. Titus Mould, MD, [place] Pgr: [facility]

## 2012-03-30 NOTE — Progress Notes (Signed)
PT Cancellation Note  Treatment cancelled today due to medical issues with patient which prohibited therapy.  Pt's hgb currenlty 6.9.  Will continue to follow.  Thanks!!!  Hughey Rittenberry 03/30/2012, 11:13 AM Antoine Poche, [place] DPT [phone]

## 2012-03-30 NOTE — Progress Notes (Signed)
Notified by lab of pt's hemoglobin of 5.9 this am. Dr Sarajane Jews made aware. Day shift RN updated.

## 2012-03-30 NOTE — Consult Note (Signed)
Reason for Consult: Anemia Referring Physician: [facility]  [name] [name] is an 46 y.o. male.  HPI: Patient is a 46 year old man recently treated for strep pneumoniae pneumonia and bacteremia, AKI-from March 20 of March 25 who is readmitted with right-sided chest pain and cough- found to have worsening anemia requiring blood transfusion. Patient's hemoglobin on 03/11/2012 was 10- which slowly trended down to 5.9 yesterday- which required 2 units of PRBCs. Did not have any bowel movements since admission. Also denies having any vomiting episodes after admission. He did report having to vomiting episodes before coming in hospital on Friday- and says that he might have seen red color in the vomit- but is unsure as it was dark in the parking lot. Patient denies any previous history of anemia. Has not noticed/pain attention to change in color of stool- or any blood in stool. He does report losing 20 pounds weight in past 6 months but has no apparent change in appetite. Did use ibuprofen for 3-4 days before his previous admission- but no long-term use of NSAIDs.  Denies any history of peptic ulcer disease, but does report having acid reflux for some time now. Did not have any GI evaluation in past- including no EGD or colonoscopy. Has family history of colon cancer in maternal aunt-diagnosed in her 67s   Past Medical History  Diagnosis Date  . Diabetes mellitus   . Pneumonia 02/2012    Strep pneumoniae bilateral pneumonia complicated by bacteremia  . Bacteremia     History reviewed. No pertinent past surgical history.  No family history on file.  Social History:  reports that he has never smoked. He has never used smokeless tobacco. He reports that he does not drink alcohol or use illicit drugs.  Allergies:  Allergies  Allergen Reactions  . Penicillins Rash    Tolerating Ceftriaxone (03/12/12)    Medications: I have reviewed the patient's current medications.  Results for  orders placed during the hospital encounter of 03/27/12 (from the past 48 hour(s))  URINALYSIS, ROUTINE W REFLEX MICROSCOPIC     Status: Abnormal   Collection Time   03/28/12 11:41 AM      Component Value Range Comment   Color, Urine AMBER (*) YELLOW  BIOCHEMICALS MAY BE AFFECTED BY COLOR   APPearance CLEAR  CLEAR     Specific Gravity, Urine 1.021  1.005 - 1.030     pH 5.0  5.0 - 8.0     Glucose, UA 100 (*) NEGATIVE (mg/dL)    Hgb urine dipstick TRACE (*) NEGATIVE     Bilirubin Urine NEGATIVE  NEGATIVE     Ketones, ur NEGATIVE  NEGATIVE (mg/dL)    Protein, ur 100 (*) NEGATIVE (mg/dL)    Urobilinogen, UA 0.2  0.0 - 1.0 (mg/dL)    Nitrite NEGATIVE  NEGATIVE     Leukocytes, UA NEGATIVE  NEGATIVE    URINE MICROSCOPIC-ADD ON     Status: Normal   Collection Time   03/28/12 11:41 AM      Component Value Range Comment   Squamous Epithelial / LPF RARE  RARE     RBC / HPF 0-2  <3 (RBC/hpf)   GLUCOSE, CAPILLARY     Status: Abnormal   Collection Time   03/28/12 12:10 PM      Component Value Range Comment   Glucose-Capillary 216 (*) 70 - 99 (mg/dL)    Comment 1 Notify RN      Comment 2 Documented in Chart     GLUCOSE,  CAPILLARY     Status: Abnormal   Collection Time   03/28/12  4:04 PM      Component Value Range Comment   Glucose-Capillary 308 (*) 70 - 99 (mg/dL)    Comment 1 Notify RN      Comment 2 Documented in Chart     GLUCOSE, CAPILLARY     Status: Abnormal   Collection Time   03/28/12  9:23 PM      Component Value Range Comment   Glucose-Capillary 295 (*) 70 - 99 (mg/dL)   COMPREHENSIVE METABOLIC PANEL     Status: Abnormal   Collection Time   03/29/12  5:55 AM      Component Value Range Comment   Sodium 130 (*) 135 - 145 (mEq/L)    Potassium 4.3  3.5 - 5.1 (mEq/L)    Chloride 96  96 - 112 (mEq/L)    CO2 25  19 - 32 (mEq/L)    Glucose, Bld 334 (*) 70 - 99 (mg/dL)    BUN 41 (*) 6 - 23 (mg/dL)    Creatinine, Ser 1.25  0.50 - 1.35 (mg/dL)    Calcium 8.2 (*) 8.4 - 10.5 (mg/dL)    Total  Protein 6.6  6.0 - 8.3 (g/dL)    Albumin 2.0 (*) 3.5 - 5.2 (g/dL)    AST 41 (*) 0 - 37 (U/L)    ALT 69 (*) 0 - 53 (U/L)    Alkaline Phosphatase 347 (*) 39 - 117 (U/L)    Total Bilirubin 0.6  0.3 - 1.2 (mg/dL)    GFR calc non Af Amer 68 (*) >90 (mL/min)    GFR calc Af Amer 79 (*) >90 (mL/min)   CBC     Status: Abnormal   Collection Time   03/29/12  5:55 AM      Component Value Range Comment   WBC 13.7 (*) 4.0 - 10.5 (K/uL)    RBC 2.02 (*) 4.22 - 5.81 (MIL/uL)    Hemoglobin 5.9 (*) 13.0 - 17.0 (g/dL)    HCT 18.6 (*) 39.0 - 52.0 (%)    MCV 92.1  78.0 - 100.0 (fL)    MCH 29.2  26.0 - 34.0 (pg)    MCHC 31.7  30.0 - 36.0 (g/dL)    RDW 14.9  11.5 - 15.5 (%)    Platelets 327  150 - 400 (K/uL)   GLUCOSE, CAPILLARY     Status: Abnormal   Collection Time   03/29/12  6:47 AM      Component Value Range Comment   Glucose-Capillary 308 (*) 70 - 99 (mg/dL)   VANCOMYCIN, TROUGH     Status: Abnormal   Collection Time   03/29/12  8:52 AM      Component Value Range Comment   Vancomycin Tr 21.3 (*) 10.0 - 20.0 (ug/mL)   PREPARE RBC (CROSSMATCH)     Status: Normal   Collection Time   03/29/12  9:30 AM      Component Value Range Comment   Order Confirmation ORDER PROCESSED BY BLOOD BANK     VITAMIN B12     Status: Abnormal   Collection Time   03/29/12 11:34 AM      Component Value Range Comment   Vitamin B-12 937 (*) 211 - 911 (pg/mL)   FOLATE     Status: Normal   Collection Time   03/29/12 11:34 AM      Component Value Range Comment   Folate 12.8     IRON AND  TIBC     Status: Abnormal   Collection Time   03/29/12 11:34 AM      Component Value Range Comment   Iron <10 (*) 42 - 135 (ug/dL)    TIBC Not calculated due to Iron <10.  215 - 435 (ug/dL)    Saturation Ratios Not calculated due to Iron <10.  20 - 55 (%)    UIBC 189  125 - 400 (ug/dL)   FERRITIN     Status: Abnormal   Collection Time   03/29/12 11:34 AM      Component Value Range Comment   Ferritin 906 (*) 22 - 322 (ng/mL)   RETICULOCYTES      Status: Abnormal   Collection Time   03/29/12 11:34 AM      Component Value Range Comment   Retic Ct Pct 5.6 (*) 0.4 - 3.1 (%)    RBC. 2.16 (*) 4.22 - 5.81 (MIL/uL)    Retic Count, Manual 121.0  19.0 - 186.0 (K/uL)   GLUCOSE, CAPILLARY     Status: Abnormal   Collection Time   03/29/12 11:44 AM      Component Value Range Comment   Glucose-Capillary 378 (*) 70 - 99 (mg/dL)    Comment 1 Notify RN      Comment 2 Documented in Chart     HEMOGLOBIN AND HEMATOCRIT, BLOOD     Status: Abnormal   Collection Time   03/29/12  4:14 PM      Component Value Range Comment   Hemoglobin 7.4 (*) 13.0 - 17.0 (g/dL) POST TRANSFUSION SPECIMEN   HCT 22.2 (*) 39.0 - 52.0 (%)   GLUCOSE, CAPILLARY     Status: Abnormal   Collection Time   03/29/12  4:22 PM      Component Value Range Comment   Glucose-Capillary 197 (*) 70 - 99 (mg/dL)    Comment 1 Notify RN      Comment 2 Documented in Chart     GLUCOSE, CAPILLARY     Status: Abnormal   Collection Time   03/29/12 10:05 PM      Component Value Range Comment   Glucose-Capillary 185 (*) 70 - 99 (mg/dL)   CBC     Status: Abnormal   Collection Time   03/30/12  5:30 AM      Component Value Range Comment   WBC 10.6 (*) 4.0 - 10.5 (K/uL)    RBC 2.34 (*) 4.22 - 5.81 (MIL/uL)    Hemoglobin 6.9 (*) 13.0 - 17.0 (g/dL)    HCT 20.8 (*) 39.0 - 52.0 (%)    MCV 88.9  78.0 - 100.0 (fL)    MCH 29.5  26.0 - 34.0 (pg)    MCHC 33.2  30.0 - 36.0 (g/dL)    RDW 15.1  11.5 - 15.5 (%)    Platelets 299  150 - 400 (K/uL)   COMPREHENSIVE METABOLIC PANEL     Status: Abnormal   Collection Time   03/30/12  5:30 AM      Component Value Range Comment   Sodium 130 (*) 135 - 145 (mEq/L)    Potassium 4.2  3.5 - 5.1 (mEq/L)    Chloride 97  96 - 112 (mEq/L)    CO2 25  19 - 32 (mEq/L)    Glucose, Bld 190 (*) 70 - 99 (mg/dL)    BUN 36 (*) 6 - 23 (mg/dL)    Creatinine, Ser 1.15  0.50 - 1.35 (mg/dL)    Calcium 8.3 (*) 8.4 -  10.5 (mg/dL)    Total Protein 6.3  6.0 - 8.3 (g/dL)    Albumin 1.9  (*) 3.5 - 5.2 (g/dL)    AST 52 (*) 0 - 37 (U/L)    ALT 78 (*) 0 - 53 (U/L)    Alkaline Phosphatase 494 (*) 39 - 117 (U/L)    Total Bilirubin 0.6  0.3 - 1.2 (mg/dL)    GFR calc non Af Amer 75 (*) >90 (mL/min)    GFR calc Af Amer 87 (*) >90 (mL/min)   GLUCOSE, CAPILLARY     Status: Normal   Collection Time   03/30/12  6:57 AM      Component Value Range Comment   Glucose-Capillary 96  70 - 99 (mg/dL)     [place] Chest  03/28/2012  *RADIOLOGY REPORT*  Clinical Data: 46 year old male with pneumonia and pleural effusions on chest CT.  Assess for thoracentesis.  CHEST ULTRASOUND  Comparison: Chest CT without contrast 03/28/2012 and earlier.  Findings: Gray-scale imaging of the right hemithorax demonstrates a small component of pleural fluid, with a larger adjacent component of consolidated lung.  No free fluid seen subjacent to the right hemidiaphragm.  The quantity of pleural fluid identified is insufficient for thoracentesis.  IMPRESSION: Right pleural effusion and larger component of right lung consolidation on ultrasound.  Pleural fluid component quantity insufficient for thoracentesis at this time.  Original Report Authenticated By: Randall An, M.D.   US Abdomen Complete  03/28/2012  *RADIOLOGY REPORT*  Clinical Data:  Elevated LFTs.  Right upper quadrant pain and nausea.  ABDOMINAL ULTRASOUND COMPLETE  Comparison:  03/12/2012  Findings:  Gallbladder:  No gallstones, gallbladder wall thickening, or pericholecystic fluid. Negative sonographic Murphy's sign reported by the sonographer.  Common Bile Duct:  Within normal limits in caliber.  Liver: No focal mass lesion identified.  Within normal limits in parenchymal echogenicity.  IVC:  Appears normal.  Pancreas:  No abnormality identified.  Spleen:  Within normal limits in size and echotexture.  Right kidney:  Normal in size and parenchymal echogenicity.  No evidence of mass or hydronephrosis.  Left kidney:  Normal in size and parenchymal echogenicity.  No  evidence of mass or hydronephrosis.  Abdominal Aorta:  No aneurysm identified.  Other findings:  Small bilateral pleural effusions.  Small amount of upper abdominal ascites posterior to the liver and spleen.  IMPRESSION:  1.  Small amount of abdominal ascites. 2.  Small pleural effusions.  Original Report Authenticated By: Angelita Ingles, M.D.    ROS:  As stated above in the HPI otherwise negative.  Blood pressure 129/84, pulse 98, temperature 98.5 F (36.9 C), temperature source Oral, resp. rate 18, height 6\' 2"  (1.88 m), weight 87.7 kg (193 lb 5.5 oz), SpO2 94.00%.  PE: Gen: NAD, Alert and Oriented HEENT:  [place]/AT, EOMI Neck: Supple, no LAD Lungs: Decreased air entry bilaterally lower lung fields. No wheezing or crackles in upper lung fields. CV: S1, S2, RRR without M/G/R ABM: Soft, NTND, +BS Ext: Bilateral 2+ edema.  Assessment/Plan:  # Anemia:  Hb: 6.9< 7.4< 5.9< 7.0<8.0<<<10.0 ( 03/11/2012 )  Iron/TIBC/Ferritin    Component Value Date/Time   IRON <10* 03/29/2012 1134   TIBC Not calculated due to Iron <10. 03/29/2012 1134   FERRITIN 906* 03/29/2012 1134   Patient denies any previous known history of anemia-although does not remember if his hemoglobin was checked recently. Unclear if this is from GI bleeding- but is highly likely- mostly upper GI considering age group. -  Ferritin 906- but has acute stress. Iron stores are most likely low. - Will check occult blood in stool. - Transfuse to keep hemoglobin more than 7. - Likely upper endoscopy tomorrow. Will discuss with Dr. Collene Mares.  #Bilateral pleural effusions: No thoracentesis- as insufficient fluid for tapping per ultrasound. - Most likely para-pneumonic effusion.  - Management per primary team. Will proceed with an EGD in AM. He will also need a colonoscopy later this week. He has not had a BM in 3 days. Will prep tomorrow for a colonoscopy on Wednesday. Will give him Miralax tonight to start the prep gradually.Marland Kitchen    PATEL,RAVI 03/30/2012, 10:53 AM

## 2012-03-31 ENCOUNTER — Encounter (HOSPITAL_COMMUNITY): Payer: Self-pay | Admitting: *Deleted

## 2012-03-31 ENCOUNTER — Encounter (HOSPITAL_COMMUNITY)
Admission: EM | Disposition: A | Discharge status: Home / Self Care | Payer: Self-pay | Source: Home / Self Care | Attending: Internal Medicine

## 2012-03-31 ENCOUNTER — Inpatient Hospital Stay (HOSPITAL_COMMUNITY): Payer: Managed Care, Other (non HMO)

## 2012-03-31 HISTORY — PX: ESOPHAGOGASTRODUODENOSCOPY: SHX5428

## 2012-03-31 LAB — COMPREHENSIVE METABOLIC PANEL
ALT: 127 U/L — ABNORMAL HIGH (ref 0–53)
AST: 103 U/L — ABNORMAL HIGH (ref 0–37)
Albumin: 1.9 g/dL — ABNORMAL LOW (ref 3.5–5.2)
Alkaline Phosphatase: 792 U/L — ABNORMAL HIGH (ref 39–117)
BUN: 31 mg/dL — ABNORMAL HIGH (ref 6–23)
CO2: 25 mEq/L (ref 19–32)
Calcium: 8.7 mg/dL (ref 8.4–10.5)
Chloride: 95 mEq/L — ABNORMAL LOW (ref 96–112)
Creatinine, Ser: 1.1 mg/dL (ref 0.50–1.35)
GFR calc Af Amer: >90 mL/min (ref >90)
GFR calc non Af Amer: 79 mL/min — ABNORMAL LOW (ref >90)
Glucose, Bld: 250 mg/dL — ABNORMAL HIGH (ref 70–99)
Potassium: 4.2 mEq/L (ref 3.5–5.1)
Sodium: 130 mEq/L — ABNORMAL LOW (ref 135–145)
Total Bilirubin: 0.6 mg/dL (ref 0.3–1.2)
Total Protein: 6.7 g/dL (ref 6.0–8.3)

## 2012-03-31 LAB — TYPE AND SCREEN
ABO/RH(D): O POS
Antibody Screen: NEGATIVE
Unit division: 0
Unit division: 0

## 2012-03-31 LAB — GLUCOSE, CAPILLARY
Glucose-Capillary: 211 mg/dL — ABNORMAL HIGH (ref 70–99)
Glucose-Capillary: 116 mg/dL — ABNORMAL HIGH (ref 70–99)
Glucose-Capillary: 75 mg/dL (ref 70–99)
Glucose-Capillary: 275 mg/dL — ABNORMAL HIGH (ref 70–99)

## 2012-03-31 LAB — CBC
HCT: 23.8 % — ABNORMAL LOW (ref 39.0–52.0)
Hemoglobin: 8 g/dL — ABNORMAL LOW (ref 13.0–17.0)
MCH: 28.8 pg (ref 26.0–34.0)
MCHC: 33.6 g/dL (ref 30.0–36.0)
MCV: 85.6 fL (ref 78.0–100.0)
Platelets: 281 10*3/uL (ref 150–400)
RBC: 2.78 MIL/uL — ABNORMAL LOW (ref 4.22–5.81)
RDW: 15.7 % — ABNORMAL HIGH (ref 11.5–15.5)
WBC: 10.1 10*3/uL (ref 4.0–10.5)

## 2012-03-31 LAB — SODIUM, URINE, RANDOM: Sodium, Ur: 71 mEq/L

## 2012-03-31 LAB — PHOSPHORUS: Phosphorus: 3.7 mg/dL (ref 2.3–4.6)

## 2012-03-31 LAB — MAGNESIUM: Magnesium: 1.9 mg/dL (ref 1.5–2.5)

## 2012-03-31 LAB — OSMOLALITY, URINE: Osmolality, Ur: 353 mOsm/kg — ABNORMAL LOW (ref 390–1090)

## 2012-03-31 LAB — OCCULT BLOOD X 1 CARD TO LAB, STOOL: Fecal Occult Bld: NEGATIVE

## 2012-03-31 SURGERY — EGD (ESOPHAGOGASTRODUODENOSCOPY)
Anesthesia: Moderate Sedation

## 2012-03-31 MED ORDER — PEG 3350-KCL-NA BICARB-NACL 420 G PO SOLR
4000.0000 mL | Freq: Once | ORAL | Status: AC
  Administered 2012-03-31: 4000 mL via ORAL
  Filled 2012-03-31: qty 4000

## 2012-03-31 MED ORDER — DIPHENHYDRAMINE HCL 50 MG/ML IJ SOLN
INTRAMUSCULAR | Status: AC
  Filled 2012-03-31: qty 1

## 2012-03-31 MED ORDER — FENTANYL NICU IV SYRINGE 50 MCG/ML
INJECTION | INTRAMUSCULAR | Status: DC | PRN
  Administered 2012-03-31 (×2): 25 ug via INTRAVENOUS

## 2012-03-31 MED ORDER — SODIUM CHLORIDE 0.9 % IV SOLN
Freq: Once | INTRAVENOUS | Status: AC
  Administered 2012-03-31: 500 mL via INTRAVENOUS

## 2012-03-31 MED ORDER — PANTOPRAZOLE SODIUM 40 MG PO TBEC: 40.0000 mg | DELAYED_RELEASE_TABLET | Freq: Two times a day (BID) | ORAL | Status: DC

## 2012-03-31 MED ORDER — PEG 3350-KCL-NA BICARB-NACL 420 G PO SOLR: 4000.0000 mL | Freq: Once | ORAL | Status: DC

## 2012-03-31 MED ORDER — IOHEXOL 300 MG/ML  SOLN
75.0000 mL | Freq: Once | INTRAMUSCULAR | Status: AC | PRN
  Administered 2012-03-31: 75 mL via INTRAVENOUS

## 2012-03-31 MED ORDER — PANTOPRAZOLE SODIUM 40 MG PO TBEC
40.0000 mg | DELAYED_RELEASE_TABLET | Freq: Every day | ORAL | Status: DC
  Administered 2012-03-31: 40 mg via ORAL
  Filled 2012-03-31 (×2): qty 1

## 2012-03-31 MED ORDER — MIDAZOLAM HCL 10 MG/2ML IJ SOLN
INTRAMUSCULAR | Status: DC | PRN
  Administered 2012-03-31 (×2): 2 mg via INTRAVENOUS
  Administered 2012-03-31: 1 mg via INTRAVENOUS

## 2012-03-31 MED ORDER — PIPERACILLIN-TAZOBACTAM 3.375 G IVPB
3.3750 g | Freq: Three times a day (TID) | INTRAVENOUS | Status: DC
  Administered 2012-03-31 – 2012-04-03 (×8): 3.375 g via INTRAVENOUS
  Filled 2012-03-31 (×10): qty 50

## 2012-03-31 MED ORDER — MIDAZOLAM HCL 10 MG/2ML IJ SOLN
INTRAMUSCULAR | Status: AC
  Filled 2012-03-31: qty 4

## 2012-03-31 MED ORDER — FENTANYL CITRATE 0.05 MG/ML IJ SOLN
INTRAMUSCULAR | Status: AC
  Filled 2012-03-31: qty 4

## 2012-03-31 NOTE — Op Note (Signed)
[facility] [place], [place]  [id number]  OPERATIVE PROCEDURE REPORT  PATIENT:  [name] [name], [name] [name]  MR#:  [id number] BIRTHDATE:  Feb 07, 1966  GENDER:  male ENDOSCOPIST:  Carol Ada, MD ASSISTANT:  Nicky Pugh and Ethelene Hal PROCEDURE DATE:  03/31/2012 PROCEDURE:  EGD, diagnostic [phone] ASA CLASS:  Class II INDICATIONS:  Anemia MEDICATIONS:  Fentanyl 50 mcg IV, Versed 4 mg IV  DESCRIPTION OF PROCEDURE:   After the risks benefits and alternatives of the procedure were thoroughly explained, informed consent was obtained.  The Pentax Gastroscope [id number] endoscope was introduced through the mouth and advanced to the second portion of the duodenum, without limitations.  The instrument was slowly withdrawn as the mucosa was fully examined. <<PROCEDUREIMAGES>>  FINDINGS:  An LA Grade C esophagitis was identified. No other abnormalities in the upper GI tract. There was no bleeding from the esophagitis or any evidence of any old blood.    Retroflexed views revealed no abnormalities.    The scope was then withdrawn from the patient and the procedure terminated.  COMPLICATIONS:  None  IMPRESSION:  1) LA Grade C Esophagitis. RECOMMENDATIONS:  1) Colonoscopy for tomorrow. 2) Protonix 40 mg BID.  ______________________________ Carol Ada, MD  n. Lorrin MaisCarol Ada at 03/31/2012 03:56 PM  [place], Herbie [place], [id number]

## 2012-03-31 NOTE — H&P (View-Only) (Signed)
PROGRESS NOTE  [name] [name] [id number] DOB: 1966-11-17 DOA: 03/27/2012 PCP: [name], MD, MD Infectious Disease: Dr. Scharlene Gloss  Pulmonary: Dr. Baltazar Apo  Brief narrative: 46 year old man recently treated for streptococcus pneumoniae bacteremia and pneumonia. Presented with cough, right-sided chest pain.  Chart review:  03/11/2012-03/16/2012 hospitalization: Streptococcus pneumoniae bacteremia, pneumonia, acute renal failure, elevated LFTs.  Past medical history: Diabetes mellitus  Antibiotics:  April 6: Vancomycin  April 6: Zosyn  Interim History: Interval documentation reviewed. Afebrile. Minimal hypoxia. Hemoglobin 6.9. White blood cell count decreased to 10.6. LFT elevation persists.  Subjective: Slept much better. Still has exquisite right-sided chest pain with cough. Not particularly short of breath.  Objective: Filed Vitals:   03/29/12 1500 03/29/12 2009 03/30/12 0511 03/30/12 0514  BP: 115/76 119/74 141/90 129/84  Pulse: 88 95 95 98  Temp: 97.9 F (36.6 C) 98.7 F (37.1 C) 98.6 F (37 C) 98.5 F (36.9 C)  TempSrc: Oral Oral Oral Oral  Resp: 18 18 18 18   Height:      Weight:      SpO2:  94% 94% 94%    Intake/Output Summary (Last 24 hours) at 03/30/12 0820 Last data filed at 03/30/12 0248  Gross per 24 hour  Intake   1330 ml  Output   1700 ml  Net   -370 ml    Exam:   General:  Appears more comfortable.   ENT: No change in soft tissue mass right buccal mucosa.  Cardiovascular: Regular rate and rhythm. No murmur, rub, gallop. No change in extremity edema (2+).  Respiratory: Clear to auscultation bilaterally. No wheezes, rales, rhonchi. Shallow inspirations limit examination.  Abdomen: Soft, nontender, nondistended. No right upper quadrant tenderness.  Musculoskeletal: No change in right-sided chest pain.  Psychiatric: Grossly normal mood and affect. Speech fluent and appropriate  Data Reviewed: Basic Metabolic  Panel:  Lab 0000000 0530 03/29/12 0555 03/28/12 0007  NA 130* 130* 133*  K 4.2 4.3 --  CL 97 96 98  CO2 25 25 25   GLUCOSE 190* 334* 160*  BUN 36* 41* 40*  CREATININE 1.15 1.25 1.13  CALCIUM 8.3* 8.2* 8.5  MG -- -- --  PHOS -- -- --   Liver Function Tests:  Lab 03/30/12 0530 03/29/12 0555 03/28/12 0007  AST 52* 41* 107*  ALT 78* 69* 104*  ALKPHOS 494* 347* 365*  BILITOT 0.6 0.6 1.6*  PROT 6.3 6.6 7.1  ALBUMIN 1.9* 2.0* 2.5*   CBC:  Lab 03/30/12 0530 03/29/12 1614 03/29/12 0555 03/28/12 0007  WBC 10.6* -- 13.7* 19.6*  NEUTROABS -- -- -- --  HGB 6.9* 7.4* 5.9* 7.0*  HCT 20.8* 22.2* 18.6* 21.2*  MCV 88.9 -- 92.1 91.8  PLT 299 -- 327 395    Basename 03/30/12 0657 03/29/12 2205 03/29/12 1622 03/29/12 1144 03/29/12 0647 03/28/12 2123  GLUCAP 96 185* 197* 378* 308* 295*    Studies: Ct Chest Wo Contrast  03/28/2012  *RADIOLOGY REPORT*  Clinical Data: Cough, right-sided chest pain  CT CHEST WITHOUT CONTRAST  Technique:  Multidetector CT imaging of the chest was performed following the standard protocol without IV contrast.  Comparison: Chest radiographs dated 03/28/2012.  CT chest dated 03/11/2012.  Findings: Patchy posterior right upper lobe opacity and near complete right lower lobe consolidation, suspicious for pneumonia. Associated moderate right pleural effusion.  Patchy left lower lobe opacity, atelectasis versus pneumonia, with associated small left pleural effusion.  No pneumothorax.  Heart is normal in size.  Small pericardial effusion.  Visualized upper abdomen  is unremarkable.  Mild degenerative changes of the visualized thoracolumbar spine.  IMPRESSION: Right upper/lower lobe pneumonia, with associated moderate right pleural effusion.  Patchy left lower lobe opacity, atelectasis versus pneumonia, with associated small left pleural effusion.  Original Report Authenticated By: Julian Hy, M.D.   Scheduled Meds:    . acetaminophen  650 mg Oral Once  . atorvastatin   20 mg Oral q1800  . diphenhydrAMINE  25 mg Oral Once  . docusate sodium  100 mg Oral BID  . furosemide  20 mg Oral Daily  . insulin aspart  0-9 Units Subcutaneous TID WC  . insulin aspart  11 Units Subcutaneous TID AC  . insulin glargine  40 Units Subcutaneous QHS  . lisinopril  5 mg Oral Daily  . piperacillin-tazobactam (ZOSYN)  IV  3.375 g Intravenous Q8H  . senna  1 tablet Oral BID  . sodium chloride  3 mL Intravenous Q12H  . vancomycin  1,000 mg Intravenous Q8H  . DISCONTD: aspirin  81 mg Oral Daily  . DISCONTD: heparin  5,000 Units Subcutaneous Q8H   Continuous Infusions:    Assessment/Plan: 1. Bilateral pleural effusions, right greater than left: Not amenable to thoracentesis secondary to small size on ultrasound. Pleural effusion may be etiology of pain. He is afebrile, has minimal hypoxia and leukocytosis continues to trend downward. 2. Recent pneumonia with pleural effusions: Chest CT again shows right lower lobe pneumonia with some right upper lobe patchiness. Not clear whether this represents new/persistent infection versus lag from recent treatment. Continue empiric antibiotic therapy for now. Pulmonary consultation for further recommendations and possible bronchoscopy. 3. History of strep pneumoniae pneumonia 4. Mixed pattern elevated LFTs: Etiology not clear however abdominal ultrasound unremarkable, hepatitis panel negative and recent HIDA scan was negative. Possibly related to effusion. 5. Hyponatremia: Stable. Likely related to recent pneumonia/pulmonary process. 6. Anemia: No history of bleeding. Check fecal occult blood. No previous data prior to last hospitalization when he presented with hemoglobin of 10 which quickly decreased, probably from dilution. Certainly a component of this is likely to be secondary to acute illness but I am skeptical that acute illness totally accounts for it. Transfuse additional units of packed or blood cells today. We will ask GI to  see. 7. Diabetes mellitus type 2, uncontrolled: Improved control. Continue sliding scale insulin, Lantus, meal coverage. 8. Right buccal mucosa mass: Outpatient followup with oral surgery or ENT. This developed during his last hospitalization.  Concern for underlying malignancy--at this point it is difficult to tie together his multiple abnormalities. Appreciate pulmonology and gastroenterology recommendations.  Code Status: Full code  Family Communication: None, per patient request  Disposition Plan: Home when improved.   Murray Hodgkins, MD  [facility] Pager [phone]  If 7PM-7AM, please contact night-coverage [email or web address] Password [email or web address] 03/30/2012, 8:20 AM    LOS: 3 days

## 2012-03-31 NOTE — Progress Notes (Signed)
PROGRESS NOTE  [name] [name] [id number] DOB: 09-Jul-1966 DOA: 03/27/2012 PCP: [name], MD, MD Infectious Disease: Dr. Scharlene Gloss  Pulmonary: Dr. Baltazar Apo  Brief narrative: 46 year old man recently treated for streptococcus pneumoniae bacteremia and pneumonia. Presented with cough, right-sided chest pain.  Chart review:  03/11/2012-03/16/2012 hospitalization: Streptococcus pneumoniae bacteremia, pneumonia, acute renal failure, elevated LFTs.  Past medical history: Diabetes mellitus  Consultations:  Pulmonology  Gastroenterology  ENT  Physical therapy: Home health  Procedures:  April 9: Upper endoscopy: LA grade C esophagitis. Returns 40 mg by mouth twice a day.  April 10: Colonoscopy  Antibiotics:  April 6: Vancomycin  April 6: Zosyn  Interim History: Interval documentation reviewed. Bronchoscopy is being considered. Afebrile, vital signs stable. Minimal hypoxia. Chest x-ray shows increasing right-sided effusion.  Subjective: Still coughing but has less pain. Short of breath at times especially when lying down.  Objective: Filed Vitals:   03/31/12 1535 03/31/12 1540 03/31/12 1545 03/31/12 1600  BP: 168/89 168/89 151/81 145/81  Pulse:      Temp:      TempSrc:      Resp: 20 28 32 6  Height:      Weight:      SpO2: 92% 94% 93% 95%   No intake or output data in the 24 hours ending 03/31/12 1709  Exam:   General:  Appears more comfortable.   ENT: No change in soft tissue mass right buccal mucosa.  Cardiovascular: Regular rate and rhythm. No murmur, rub, gallop. Slight decrease in extremity edema (2+).  Respiratory: Clear to auscultation anteriorly bilaterally. No wheezes, rales, rhonchi. Diminished breath sounds on the right posteriorly. Shallow inspirations limit examination.  Psychiatric: Grossly normal mood and affect. Speech fluent and appropriate  Data Reviewed: Basic Metabolic Panel:  Lab [id number] 0500 03/30/12 0530  03/29/12 0555 03/28/12 0007  NA 130* 130* 130* 133*  K 4.2 4.2 -- --  CL 95* 97 96 98  CO2 25 25 25 25   GLUCOSE 250* 190* 334* 160*  BUN 31* 36* 41* 40*  CREATININE 1.10 1.15 1.25 1.13  CALCIUM 8.7 8.3* 8.2* 8.5  MG 1.9 -- -- --  PHOS 3.7 -- -- --   Liver Function Tests:  Lab 03/31/12 0500 03/30/12 0530 03/29/12 0555 03/28/12 0007  AST 103* 52* 41* 107*  ALT 127* 78* 69* 104*  ALKPHOS 792* 494* 347* 365*  BILITOT 0.6 0.6 0.6 1.6*  PROT 6.7 6.3 6.6 7.1  ALBUMIN 1.9* 1.9* 2.0* 2.5*   CBC:  Lab 03/31/12 0500 03/30/12 0530 03/29/12 1614 03/29/12 0555 03/28/12 0007  WBC 10.1 10.6* -- 13.7* 19.6*  NEUTROABS -- -- -- -- --  HGB 8.0* 6.9* 7.4* 5.9* 7.0*  HCT 23.8* 20.8* 22.2* 18.6* 21.2*  MCV 85.6 88.9 -- 92.1 91.8  PLT 281 299 -- 327 395    Basename 03/31/12 1101 03/31/12 0706 03/30/12 2144 03/30/12 1620 03/30/12 1153 03/30/12 0657  GLUCAP 116* 211* 175* 197* 233* 96   Studies: Dg Chest Port 1 View  03/31/2012  *RADIOLOGY REPORT*  Clinical Data: Assess infiltrate.  PORTABLE CHEST - 1 VIEW  Comparison: 03/28/2012  Findings: Very low lung volumes.  Worsening bibasilar opacities and probable edema.  Bilateral effusions.  Cardiomegaly.  IMPRESSION: Increasing pulmonary edema, bibasilar opacities and effusions. Very low lung volumes.  Original Report Authenticated By: Raelyn Number, M.D.   Scheduled Meds:    . sodium chloride   Intravenous Once  . atorvastatin  20 mg Oral q1800  . docusate sodium  100  mg Oral BID  . furosemide  20 mg Intravenous Q12H  . insulin aspart  0-9 Units Subcutaneous TID WC  . insulin aspart  11 Units Subcutaneous TID AC  . insulin glargine  40 Units Subcutaneous QHS  . piperacillin-tazobactam (ZOSYN)  IV  3.375 g Intravenous Q8H  . polyethylene glycol  68 g Oral Once  . senna  1 tablet Oral BID  . sodium chloride  3 mL Intravenous Q12H  . vancomycin  1,000 mg Intravenous Q8H   Continuous Infusions:    Assessment/Plan: 1. Bilateral pleural  effusions, right greater than left: Not amenable to thoracentesis on admission secondary to small size on ultrasound. However, repeat chest x-ray today suggests much larger effusion. Defer timing of thoracentesis to pulmonology. Appreciate pulmonology evaluation.  2. Recent pneumonia with pleural effusions: Chest CT suggested right lower lobe pneumonia with some right upper lobe patchiness. Not clear whether this represents new/persistent infection versus lag from recent treatment. Continue empiric antibiotic therapy for now. Pulmonary consultation appreciated. Bronchoscopy being considered.  3. History of strep pneumoniae pneumonia 4. Mixed pattern elevated LFTs: Etiology not clear. Abdominal ultrasound unremarkable, hepatitis panel negative and recent HIDA scan was negative. Possibly related to effusion. Continue to follow. 5. Hyponatremia: Stable. Likely related to current pneumonia/pulmonary process. 6. Anemia: No history of bleeding. Fecal occult blood negative. No previous data prior to last hospitalization when he presented with hemoglobin of 10 which quickly decreased, probably from dilution. Certainly a component of this is likely to be secondary to acute illness but I am skeptical that acute illness totally accounts for it. Appreciate gastroenterology evaluation 7. Diabetes mellitus type 2, uncontrolled: Continue sliding scale insulin, Lantus, meal coverage. 8. Right buccal mucosa mass: This developed during his last hospitalization. Etiology unclear. Discussed with Dr. Allena Napoleon is to obtain CT. He will see in the morning.   Concern for underlying malignancy--at this point it is difficult to tie together his multiple abnormalities. [place] pulmonology, gastroenterology and ENT recommendations.  Code Status: Full code  Family Communication: None, per patient request  Disposition Plan: Home when improved.   Murray Hodgkins, MD  [facility] Pager [phone]  If  7PM-7AM, please contact night-coverage [email or web address] Password [email or web address] 03/31/2012, 5:09 PM    LOS: 4 days

## 2012-03-31 NOTE — Evaluation (Signed)
Physical Therapy Evaluation Patient Details Name: [name] [name] MRN: [id number] DOB: Jul 12, 1966 Today's Date: 03/31/2012  Problem List:  Patient Active Problem List  Diagnoses  . Community acquired pneumonia  . Diabetes mellitus  . Acute kidney failure  . Elevated LFTs  . Bacteremia due to Streptococcus pneumoniae  . Hyponatremia  . Bilateral leg edema  . Cyst of skin  . Parapneumonic effusion  . Anemia  . Hypoalbuminemia  . Total bilirubin, elevated    Past Medical History:  Past Medical History  Diagnosis Date  . Diabetes mellitus   . Pneumonia 02/2012    Strep pneumoniae bilateral pneumonia complicated by bacteremia  . Bacteremia    Past Surgical History: History reviewed. No pertinent past surgical history.  PT Assessment/Plan/Recommendation PT Assessment Clinical Impression Statement: Pt is 46 y/o male admitted for PNA and parapneumonic effusion with further generalize weakness and low Hgb.  Pt will benefit from acute PT services to improve overall endurance, strength and overall mobiltiy to prepare for safe d/c home.  Limited evaluation due to pt leaving for test. PT Recommendation/Assessment: Patient will need skilled PT in the acute care venue PT Problem List: Decreased strength;Decreased activity tolerance;Decreased balance;Decreased mobility;Decreased knowledge of use of DME PT Therapy Diagnosis : Difficulty walking;Abnormality of gait;Generalized weakness PT Plan PT Frequency: Min 3X/week PT Treatment/Interventions: DME instruction;Gait training;Stair training;Functional mobility training;Therapeutic activities;Therapeutic exercise;Balance training;Neuromuscular re-education;Patient/family education PT Recommendation Follow Up Recommendations: Home health PT;Outpatient PT (HHPT vs OPPT depending ability to drive) Equipment Recommended: Rolling walker with 5" wheels (Will continue to determine) PT Goals  Acute Rehab PT Goals PT Goal Formulation: With  patient Time For Goal Achievement: 7 days Pt will go Supine/Side to Sit: Independently PT Goal: Supine/Side to Sit - Progress: Goal set today Pt will go Sit to Stand: Independently PT Goal: Sit to Stand - Progress: Goal set today Pt will go Stand to Sit: Independently PT Goal: Stand to Sit - Progress: Goal set today Pt will Stand: 3 - 5 min;with modified independence PT Goal: Stand - Progress: Goal set today Pt will Ambulate: >150 feet;with modified independence;with rolling walker PT Goal: Ambulate - Progress: Goal set today Pt will Go Up / Down Stairs: Flight;with modified independence;with rail(s) PT Goal: Up/Down Stairs - Progress: Goal set today  PT Evaluation Precautions/Restrictions  Precautions Precautions: Fall Prior Functioning  Home Living Lives With: Daughter (48 y/o) Receives Help From: Friend(s) Type of Home: Apartment (on third floor) Home Layout: One level Home Access: Stairs to enter Entrance Stairs-Rails: Left Entrance Stairs-Number of Steps: 14 Bathroom Shower/Tub: Chiropodist: Standard Bathroom Accessibility: Yes Home Adaptive Equipment: None Prior Function Level of Independence: Independent with basic ADLs;Independent with gait;Independent with transfers Able to Take Stairs?: Yes (progressively got weaker and needed extra time ) Driving: Yes 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; manufacturer) Cognition Cognition Arousal/Alertness: Awake/alert Overall Cognitive Status: Appears within functional limits for tasks assessed Orientation Level: Oriented X4 Sensation/Coordination Coordination Gross Motor Movements are Fluid and Coordinated: Yes Extremity Assessment RUE Assessment RUE Assessment: Within Functional Limits LUE Assessment LUE Assessment: Within Functional Limits RLE Assessment RLE Assessment: Within Functional Limits (overall poor endurance) LLE Assessment LLE Assessment: Within Functional Limits (overall  poor endurance) Mobility (including Balance) Bed Mobility Bed Mobility: Yes Sit to Supine: 5: Set up;With rail;HOB flat (minguard for safety) Sit to Supine - Details (indicate cue type and reason): Minguard for safety for pt to transfer to stretcher to go off floor for procedure. Transfers Transfers: Yes Sit to  Stand: 4: Min assist;From chair/3-in-1;With armrests Sit to Stand Details (indicate cue type and reason): (A) to initiate transfer with cues for hand placement Stand to Sit: 4: Min assist;To bed (minguard) Stand to Sit Details: Minguard for safety with cues for hand placement Ambulation/Gait Ambulation/Gait: Yes Ambulation/Gait Assistance: 4: Min assist Ambulation/Gait Assistance Details (indicate cue type and reason): HHA to maintain balance.  Pt with LOB x 2 with staggered gait Ambulation Distance (Feet): 20 Feet Assistive device: 1 person hand held assist Gait Pattern: Shuffle    Exercise    End of Session PT - End of Session Activity Tolerance: Patient limited by fatigue Patient left: Other (comment) (On stretcher going down for procedure) Nurse Communication: Mobility status for transfers;Mobility status for ambulation General Behavior During Session: [facility] for tasks performed Cognition: [facility] for tasks performed  Kyia Rhude 03/31/2012, 3:17 PM [place], [place] DPT [phone]

## 2012-03-31 NOTE — Progress Notes (Deleted)
PROGRESS NOTE  [name] [name] [id number] DOB: 11-02-1966 DOA: 03/27/2012 PCP: [name], MD, MD Infectious Disease: Dr. Scharlene Gloss  Pulmonary: Dr. Baltazar Apo  Brief narrative: 46 year old man recently treated for streptococcus pneumoniae bacteremia and pneumonia. Presented with cough, right-sided chest pain.  Chart review:  03/11/2012-03/16/2012 hospitalization: Streptococcus pneumoniae bacteremia, pneumonia, acute renal failure, elevated LFTs.  Past medical history: Diabetes mellitus  Consultations:  Pulmonology  Gastroenterology  ENT  Physical therapy: Home health  Procedures:  April 9: Upper endoscopy: LA grade C esophagitis. Returns 40 mg by mouth twice a day.  April 10: Colonoscopy  Antibiotics:  April 6: Vancomycin  April 6: Zosyn  Interim History: Interval documentation reviewed. Bronchoscopy is being considered. Afebrile, vital signs stable. Minimal hypoxia. Chest x-ray shows increasing right-sided effusion.  Subjective: Still coughing but has less pain. Short of breath at times especially when lying down.  Objective: Filed Vitals:   03/30/12 1700 03/30/12 1716 03/30/12 2200 03/31/12 0600  BP: 148/86 147/89 145/98 156/93  Pulse: 100 98 98 96  Temp: 98.9 F (37.2 C) 99 F (37.2 C) 99.4 F (37.4 C) 98.8 F (37.1 C)  TempSrc: Oral Oral Oral Oral  Resp: 16 20 19 19   Height:      Weight:    86.4 kg (190 lb 7.6 oz)  SpO2:  92% 93% 92%    Intake/Output Summary (Last 24 hours) at 03/31/12 0805 Last data filed at 03/30/12 1459  Gross per 24 hour  Intake    250 ml  Output    850 ml  Net   -600 ml    Exam:   General:  Appears more comfortable.   ENT: No change in soft tissue mass right buccal mucosa.  Cardiovascular: Regular rate and rhythm. No murmur, rub, gallop. Slight decrease in extremity edema (2+).  Respiratory: Clear to auscultation anteriorly bilaterally. No wheezes, rales, rhonchi. Diminished breath sounds on the  right posteriorly. Shallow inspirations limit examination.  Psychiatric: Grossly normal mood and affect. Speech fluent and appropriate  Data Reviewed: Basic Metabolic Panel:  Lab [id number] 0500 03/30/12 0530 03/29/12 0555 03/28/12 0007  NA 130* 130* 130* 133*  K 4.2 4.2 -- --  CL 95* 97 96 98  CO2 25 25 25 25   GLUCOSE 250* 190* 334* 160*  BUN 31* 36* 41* 40*  CREATININE 1.10 1.15 1.25 1.13  CALCIUM 8.7 8.3* 8.2* 8.5  MG 1.9 -- -- --  PHOS 3.7 -- -- --   Liver Function Tests:  Lab 03/31/12 0500 03/30/12 0530 03/29/12 0555 03/28/12 0007  AST 103* 52* 41* 107*  ALT 127* 78* 69* 104*  ALKPHOS 792* 494* 347* 365*  BILITOT 0.6 0.6 0.6 1.6*  PROT 6.7 6.3 6.6 7.1  ALBUMIN 1.9* 1.9* 2.0* 2.5*   CBC:  Lab 03/31/12 0500 03/30/12 0530 03/29/12 1614 03/29/12 0555 03/28/12 0007  WBC 10.1 10.6* -- 13.7* 19.6*  NEUTROABS -- -- -- -- --  HGB 8.0* 6.9* 7.4* 5.9* 7.0*  HCT 23.8* 20.8* 22.2* 18.6* 21.2*  MCV 85.6 88.9 -- 92.1 91.8  PLT 281 299 -- 327 395    Basename 03/31/12 0706 03/30/12 2144 03/30/12 1620 03/30/12 1153 03/30/12 0657 03/29/12 2205  GLUCAP 211* 175* 197* 233* 96 185*   Studies: Dg Chest Port 1 View  03/31/2012  *RADIOLOGY REPORT*  Clinical Data: Assess infiltrate.  PORTABLE CHEST - 1 VIEW  Comparison: 03/28/2012  Findings: Very low lung volumes.  Worsening bibasilar opacities and probable edema.  Bilateral effusions.  Cardiomegaly.  IMPRESSION: Increasing  pulmonary edema, bibasilar opacities and effusions. Very low lung volumes.  Original Report Authenticated By: Raelyn Number, M.D.   Scheduled Meds:    . acetaminophen  650 mg Oral Once  . atorvastatin  20 mg Oral q1800  . diphenhydrAMINE  25 mg Oral Once  . docusate sodium  100 mg Oral BID  . furosemide  20 mg Intravenous Q12H  . insulin aspart  0-9 Units Subcutaneous TID WC  . insulin aspart  11 Units Subcutaneous TID AC  . insulin glargine  40 Units Subcutaneous QHS  . piperacillin-tazobactam (ZOSYN)  IV   3.375 g Intravenous Q8H  . polyethylene glycol  68 g Oral Once  . senna  1 tablet Oral BID  . sodium chloride  3 mL Intravenous Q12H  . vancomycin  1,000 mg Intravenous Q8H  . DISCONTD: furosemide  20 mg Oral Daily  . DISCONTD: lisinopril  5 mg Oral Daily   Continuous Infusions:    Assessment/Plan: 1. Bilateral pleural effusions, right greater than left: Not amenable to thoracentesis on admission secondary to small size on ultrasound. However, repeat chest x-ray today suggests much larger effusion. Defer timing of thoracentesis to pulmonology. Appreciate pulmonology evaluation.  2. Recent pneumonia with pleural effusions: Chest CT suggested right lower lobe pneumonia with some right upper lobe patchiness. Not clear whether this represents new/persistent infection versus lag from recent treatment. Continue empiric antibiotic therapy for now. Pulmonary consultation appreciated. Bronchoscopy being considered.  3. History of strep pneumoniae pneumonia 4. Mixed pattern elevated LFTs: Etiology not clear. Abdominal ultrasound unremarkable, hepatitis panel negative and recent HIDA scan was negative. Possibly related to effusion. Continue to follow. 5. Hyponatremia: Stable. Likely related to current pneumonia/pulmonary process. 6. Anemia: No history of bleeding. Fecal occult blood negative. No previous data prior to last hospitalization when he presented with hemoglobin of 10 which quickly decreased, probably from dilution. Certainly a component of this is likely to be secondary to acute illness but I am skeptical that acute illness totally accounts for it. Appreciate gastroenterology evaluation 7. Diabetes mellitus type 2, uncontrolled: Continue sliding scale insulin, Lantus, meal coverage. 8. Right buccal mucosa mass: This developed during his last hospitalization. Etiology unclear. ENT consult to assess for possible abscess versus cyst.  Concern for underlying malignancy--at this point it is difficult  to tie together his multiple abnormalities. [place] pulmonology, gastroenterology and ENT recommendations.  Code Status: Full code  Family Communication: None, per patient request  Disposition Plan: Home when improved.   Murray Hodgkins, MD  [facility] Pager [phone]  If 7PM-7AM, please contact night-coverage [email or web address] Password [email or web address] 03/31/2012, 8:05 AM    LOS: 4 days

## 2012-03-31 NOTE — Progress Notes (Signed)
   CARE MANAGEMENT NOTE 03/31/2012  Patient:  [name] [name], [name] [name]   Account Number:  [id number]  Date Initiated:  03/31/2012  Documentation initiated by:  Lizabeth Leyden  Subjective/Objective Assessment:   Patient admitted with cough and chest pain.     Action/Plan:   Progression of care and discharge planning   Anticipated DC Date:  04/02/2012   Anticipated DC Plan:  [place]  CM consult      Choice offered to / List presented to:             Status of service:   Medicare Important Message given?   (If response is "NO", the following Medicare IM given date fields will be blank) Date Medicare IM given:   Date Additional Medicare IM given:    Discharge Disposition:    Per UR Regulation:    If discussed at Long Length of Stay Meetings, dates discussed:    Comments:  PCP: Dr Nolon Rod  03/30/2012 [place], [place] [phone] Met with patient to discuss discharge planning. CM to continue to follow for discharge needs.

## 2012-03-31 NOTE — Interval H&P Note (Signed)
History and Physical Interval Note:  03/31/2012 3:37 PM  [name] [name]  has presented today for surgery, with the diagnosis of anemia  The various methods of treatment have been discussed with the patient and family. After consideration of risks, benefits and other options for treatment, the patient has consented to  Procedure(s) (LRB): ESOPHAGOGASTRODUODENOSCOPY (EGD) (N/A) as a surgical intervention .  The patients' history has been reviewed, patient examined, no change in status, stable for surgery.  I have reviewed the patients' chart and labs.  Questions were answered to the patient's satisfaction.     [name]

## 2012-04-01 ENCOUNTER — Encounter (HOSPITAL_COMMUNITY): Payer: Self-pay | Admitting: Gastroenterology

## 2012-04-01 ENCOUNTER — Inpatient Hospital Stay (HOSPITAL_COMMUNITY): Payer: Managed Care, Other (non HMO)

## 2012-04-01 ENCOUNTER — Encounter (HOSPITAL_COMMUNITY)
Admission: EM | Disposition: A | Discharge status: Home / Self Care | Payer: Self-pay | Source: Home / Self Care | Attending: Internal Medicine

## 2012-04-01 HISTORY — PX: COLONOSCOPY: SHX5424

## 2012-04-01 LAB — GLUCOSE, CAPILLARY
Glucose-Capillary: 153 mg/dL — ABNORMAL HIGH (ref 70–99)
Glucose-Capillary: 88 mg/dL (ref 70–99)
Glucose-Capillary: 68 mg/dL — ABNORMAL LOW (ref 70–99)
Glucose-Capillary: 86 mg/dL (ref 70–99)
Glucose-Capillary: 128 mg/dL — ABNORMAL HIGH (ref 70–99)

## 2012-04-01 LAB — COMPREHENSIVE METABOLIC PANEL
ALT: 91 U/L — ABNORMAL HIGH (ref 0–53)
AST: 46 U/L — ABNORMAL HIGH (ref 0–37)
Albumin: 2 g/dL — ABNORMAL LOW (ref 3.5–5.2)
Alkaline Phosphatase: 714 U/L — ABNORMAL HIGH (ref 39–117)
BUN: 18 mg/dL (ref 6–23)
CO2: 30 mEq/L (ref 19–32)
Calcium: 8.7 mg/dL (ref 8.4–10.5)
Chloride: 98 mEq/L (ref 96–112)
Creatinine, Ser: 0.91 mg/dL (ref 0.50–1.35)
GFR calc Af Amer: >90 mL/min (ref >90)
GFR calc non Af Amer: >90 mL/min (ref >90)
Glucose, Bld: 99 mg/dL (ref 70–99)
Potassium: 3.5 mEq/L (ref 3.5–5.1)
Sodium: 136 mEq/L (ref 135–145)
Total Bilirubin: 0.5 mg/dL (ref 0.3–1.2)
Total Protein: 6.8 g/dL (ref 6.0–8.3)

## 2012-04-01 LAB — CBC
HCT: 25 % — ABNORMAL LOW (ref 39.0–52.0)
Hemoglobin: 8.2 g/dL — ABNORMAL LOW (ref 13.0–17.0)
MCH: 28.7 pg (ref 26.0–34.0)
MCHC: 32.8 g/dL (ref 30.0–36.0)
MCV: 87.4 fL (ref 78.0–100.0)
Platelets: 313 10*3/uL (ref 150–400)
RBC: 2.86 MIL/uL — ABNORMAL LOW (ref 4.22–5.81)
RDW: 15.4 % (ref 11.5–15.5)
WBC: 9.1 10*3/uL (ref 4.0–10.5)

## 2012-04-01 SURGERY — COLONOSCOPY
Anesthesia: Moderate Sedation

## 2012-04-01 MED ORDER — FUROSEMIDE 40 MG PO TABS
40.0000 mg | ORAL_TABLET | Freq: Every day | ORAL | Status: DC
  Administered 2012-04-02: 40 mg via ORAL
  Filled 2012-04-01 (×4): qty 1

## 2012-04-01 MED ORDER — MIDAZOLAM HCL 10 MG/2ML IJ SOLN
INTRAMUSCULAR | Status: DC | PRN
  Administered 2012-04-01 (×4): 2 mg via INTRAVENOUS

## 2012-04-01 MED ORDER — FENTANYL CITRATE 0.05 MG/ML IJ SOLN
INTRAMUSCULAR | Status: AC
  Filled 2012-04-01: qty 4

## 2012-04-01 MED ORDER — DEXTROSE 50 % IV SOLN
INTRAVENOUS | Status: AC
  Administered 2012-04-01: 25 mL
  Filled 2012-04-01: qty 50

## 2012-04-01 MED ORDER — SODIUM CHLORIDE 0.9 % IV SOLN: Freq: Once | INTRAVENOUS | Status: DC

## 2012-04-01 MED ORDER — MIDAZOLAM HCL 10 MG/2ML IJ SOLN
INTRAMUSCULAR | Status: AC
  Filled 2012-04-01: qty 4

## 2012-04-01 MED ORDER — FENTANYL NICU IV SYRINGE 50 MCG/ML
INJECTION | INTRAMUSCULAR | Status: DC | PRN
  Administered 2012-04-01 (×4): 25 ug via INTRAVENOUS

## 2012-04-01 MED ORDER — DIPHENHYDRAMINE HCL 50 MG/ML IJ SOLN
INTRAMUSCULAR | Status: AC
  Filled 2012-04-01: qty 1

## 2012-04-01 NOTE — Progress Notes (Signed)
[name] [name] CSN:[id number],MRN:[id number] is a 46 y.o. male,  Outpatient Primary MD for the patient is [name], MD, MD  Chief Complaint  Patient presents with  . Emesis        Subjective:   [name] [name] today has, No headache, No chest pain, No abdominal pain - No Nausea, No new weakness tingling or numbness, No Cough - SOB.   Objective:   Filed Vitals:   03/31/12 1540 03/31/12 1545 03/31/12 1600 03/31/12 2244  BP: 168/89 151/81 145/81 182/105  Pulse:    109  Temp:    98 F (36.7 C)  TempSrc:    Oral  Resp: 28 32 6 20  Height:      Weight:      SpO2: 94% 93% 95% 94%    Wt Readings from Last 3 Encounters:  03/31/12 86.4 kg (190 lb 7.6 oz)  03/31/12 86.4 kg (190 lb 7.6 oz)  03/31/12 86.4 kg (190 lb 7.6 oz)     Intake/Output Summary (Last 24 hours) at 04/01/12 1256 Last data filed at 04/01/12 0958  Gross per 24 hour  Intake      3 ml  Output      0 ml  Net      3 ml    Exam Awake Alert, Oriented *3, No new F.N deficits, Normal affect [place].AT,PERRAL. Right-sided facial swelling. Supple Neck,No JVD, No cervical lymphadenopathy appriciated.  Symmetrical Chest wall movement, Good air movement bilaterally, CTAB RRR,No Gallops,Rubs or new Murmurs, No Parasternal Heave +ve B.Sounds, Abd Soft, Non tender, No organomegaly appriciated, No rebound -guarding or rigidity. No Cyanosis, Clubbing , 1+edema, No new Rash or bruise    Data Review  CBC  Lab 04/01/12 0630 03/31/12 0500 03/30/12 0530 03/29/12 1614 03/29/12 0555 03/28/12 0007  WBC 9.1 10.1 10.6* -- 13.7* 19.6*  HGB 8.2* 8.0* 6.9* 7.4* 5.9* --  HCT 25.0* 23.8* 20.8* 22.2* 18.6* --  PLT 313 281 299 -- 327 395  MCV 87.4 85.6 88.9 -- 92.1 91.8  MCH 28.7 28.8 29.5 -- 29.2 30.3  MCHC 32.8 33.6 33.2 -- 31.7 33.0  RDW 15.4 15.7* 15.1 -- 14.9 15.6*  LYMPHSABS -- -- -- -- -- --  MONOABS -- -- -- -- -- --  EOSABS -- -- -- -- -- --  BASOSABS -- -- -- -- -- --  BANDABS -- -- -- -- -- --    Chemistries     Lab 04/01/12 0630 03/31/12 0500 03/30/12 0530 03/29/12 0555 03/28/12 0007  NA 136 130* 130* 130* 133*  K 3.5 4.2 4.2 4.3 4.4  CL 98 95* 97 96 98  CO2 30 25 25 25 25   GLUCOSE 99 250* 190* 334* 160*  BUN 18 31* 36* 41* 40*  CREATININE 0.91 1.10 1.15 1.25 1.13  CALCIUM 8.7 8.7 8.3* 8.2* 8.5  MG -- 1.9 -- -- --  AST 46* 103* 52* 41* 107*  ALT 91* 127* 78* 69* 104*  ALKPHOS 714* 792* 494* 347* 365*  BILITOT 0.5 0.6 0.6 0.6 1.6*   ------------------------------------------------------------------------------------------------------------------ estimated creatinine clearance is 119.2 ml/min (by C-G formula based on Cr of 0.91). ------------------------------------------------------------------------------------------------------------------ No results found for this basename: HGBA1C:2 in the last 72 hours ------------------------------------------------------------------------------------------------------------------ No results found for this basename: CHOL:2,HDL:2,LDLCALC:2,TRIG:2,CHOLHDL:2,LDLDIRECT:2 in the last 72 hours ------------------------------------------------------------------------------------------------------------------ No results found for this basename: TSH,T4TOTAL,FREET3,T3FREE,THYROIDAB in the last 72 hours ------------------------------------------------------------------------------------------------------------------ No results found for this basename: VITAMINB12:2,FOLATE:2,FERRITIN:2,TIBC:2,IRON:2,RETICCTPCT:2 in the last 72 hours  Coagulation profile No results found for this basename: INR:5,PROTIME:5 in the last 168  hours  No results found for this basename: DDIMER:2 in the last 72 hours  Cardiac Enzymes No results found for this basename: CK:3,CKMB:3,TROPONINI:3,MYOGLOBIN:3 in the last 168 hours ------------------------------------------------------------------------------------------------------------------ No components found with this basename:  POCBNP:3  Micro Results Recent Results (from the past 240 hour(s))  CULTURE, BLOOD (ROUTINE X 2)     Status: Normal (Preliminary result)   Collection Time   03/28/12 12:15 AM      Component Value Range Status Comment   Specimen Description BLOOD LEFT ARM   Final    Special Requests BOTTLES DRAWN AEROBIC AND ANAEROBIC 10CC   Final    Culture  Setup Time [id number]   Final    Culture     Final    Value:        BLOOD CULTURE RECEIVED NO GROWTH TO DATE CULTURE WILL BE HELD FOR 5 DAYS BEFORE ISSUING A FINAL NEGATIVE REPORT   Report Status PENDING   Incomplete   CULTURE, BLOOD (ROUTINE X 2)     Status: Normal (Preliminary result)   Collection Time   03/28/12 12:25 AM      Component Value Range Status Comment   Specimen Description BLOOD RIGHT ARM   Final    Special Requests BOTTLES DRAWN AEROBIC ONLY 10CC   Final    Culture  Setup Time [id number]   Final    Culture     Final    Value:        BLOOD CULTURE RECEIVED NO GROWTH TO DATE CULTURE WILL BE HELD FOR 5 DAYS BEFORE ISSUING A FINAL NEGATIVE REPORT   Report Status PENDING   Incomplete     Radiology Reports Dg Chest 2 View  03/28/2012  *RADIOLOGY REPORT*  Clinical Data: Cough, right rib pain, emesis, recent pneumonia  CHEST - 2 VIEW  Comparison: 03/16/2012  Findings: Patchy right lower lobe opacity, suspicious for pneumonia, with associated small-to-moderate right pleural effusion.  Left basilar opacity, atelectasis versus pneumonia, with suspected small left pleural effusion.  No pneumothorax.  The heart is normal in size.  Interval removal of right subclavian PICC.  Visualized osseous structures are within normal limits.  IMPRESSION: Patchy right lower lobe opacity, suspicious for pneumonia, with associated small-to-moderate right pleural effusion.  Left basilar opacity, atelectasis versus pneumonia, with suspected small left pleural effusion.  Original Report Authenticated By: Julian Hy, M.D.   Dg Chest 2 View  03/16/2012   *RADIOLOGY REPORT*  Clinical Data: History of pneumonia.  CHEST - 2 VIEW  Comparison: 03/11/2012.  Findings: Tip of right PICC terminates in right atrial region.  No pneumothorax is seen.  There is some atelectasis and infiltrate in the medial posterior aspect of the left lung.  There are atelectasis and infiltrative densities seen within the right perihilar region and within the right midlung and right lower lung areas with consolidation and atelectasis.  There is improvement in the amount of infiltrate compared to previous study.   On the lateral image there is continued abnormal opacity inferiorly and posteriorly.  Indistinctness of costophrenic angles may reflect an element of pleural effusion.  IMPRESSION: Right PICC in place.  Some atelectasis and infiltrative density in the medial posterior left lung inferiorly. here are atelectasis and infiltrative densities seen within the right perihilar region and within the right midlung and right lower lung areas with consolidation and atelectasis.  There is improvement in the amount of infiltrative compared to previous study but clearing has not occurred. Question small amounts of pleural effusion.  Original Report  Authenticated By: Delane Ginger, M.D.   Dg Chest 2 View  03/11/2012  *RADIOLOGY REPORT*  Clinical Data: Shortness of breath with right lower chest pain and fever.  CHEST - 2 VIEW  Comparison: None.  Findings: Trachea is midline.  Heart size normal.  There is dense airspace consolidation in the right lower lobe.  Question mild left lower lobe air space disease.  No left pleural fluid.  IMPRESSION:  1.  Dense airspace consolidation in the right lower lobe is most consistent with pneumonia.  Follow-up to clearing is recommended, to exclude a centrally obstructing mass. 2.  Question mild left lower lobe air space disease.  Original Report Authenticated By: Luretha Rued, M.D.   Ct Chest Wo Contrast  03/28/2012  *RADIOLOGY REPORT*  Clinical Data: Cough,  right-sided chest pain  CT CHEST WITHOUT CONTRAST  Technique:  Multidetector CT imaging of the chest was performed following the standard protocol without IV contrast.  Comparison: Chest radiographs dated 03/28/2012.  CT chest dated 03/11/2012.  Findings: Patchy posterior right upper lobe opacity and near complete right lower lobe consolidation, suspicious for pneumonia. Associated moderate right pleural effusion.  Patchy left lower lobe opacity, atelectasis versus pneumonia, with associated small left pleural effusion.  No pneumothorax.  Heart is normal in size.  Small pericardial effusion.  Visualized upper abdomen is unremarkable.  Mild degenerative changes of the visualized thoracolumbar spine.  IMPRESSION: Right upper/lower lobe pneumonia, with associated moderate right pleural effusion.  Patchy left lower lobe opacity, atelectasis versus pneumonia, with associated small left pleural effusion.  Original Report Authenticated By: Julian Hy, M.D.   Ct Angio Chest W/cm &/or Wo Cm  03/11/2012  *RADIOLOGY REPORT*  Clinical Data: Fever.  Cough.  Weakness.  Short of breath.  CT ANGIOGRAPHY CHEST  Technique:  Multidetector CT imaging of the chest using the standard protocol during bolus administration of intravenous contrast. Multiplanar reconstructed images including MIPs were obtained and reviewed to evaluate the vascular anatomy.  Contrast: 39mL OMNIPAQUE IOHEXOL 350 MG/ML IV SOLN  Comparison: Chest radiography same day  Findings: Pulmonary arterial opacification is excellent.  There are no pulmonary emboli.  There is consolidative pneumonia throughout the right lower lobe with minor involvement of the right upper lobe.  There is consolidative pneumonia less extensively in the posterior aspect of the left lower lobe.  There is a small amount of pleural fluid on the right.  There is a tiny amount of pericardial fluid.  There are small mediastinal lymph nodes, likely reactive.  No upper abdominal pathology is  seen.  IMPRESSION: No pulmonary emboli.  Consolidative pneumonia throughout the right lower lobe.  Partial involvement of the left lower lobe by consolidative  pneumonia. Minimal involvement of the right upper lobe.  Original Report Authenticated By: Jules Schick, M.D.   Nm Hepatobiliary  03/14/2012  *RADIOLOGY REPORT*  Clinical Data: Abdominal pain, evaluate for cholecystitis  NUCLEAR MEDICINE HEPATOHBILIARY INCLUDE GB  Radiopharmaceutical:  5.5 mCi technetium 66m Choletec  Comparison: Ultrasound dated 03/12/2012.  Findings: Normal hepatic uptake.  Small bowel is visible within 20 minutes.  Gallbladder is visible within 30 minutes, confirming cystic duct patency.  IMPRESSION: Normal hepatobiliary nuclear medicine scan.  Original Report Authenticated By: Julian Hy, M.D.   [place] Chest  03/28/2012  *RADIOLOGY REPORT*  Clinical Data: 46 year old male with pneumonia and pleural effusions on chest CT.  Assess for thoracentesis.  CHEST ULTRASOUND  Comparison: Chest CT without contrast 03/28/2012 and earlier.  Findings: Gray-scale imaging of the right hemithorax demonstrates  a small component of pleural fluid, with a larger adjacent component of consolidated lung.  No free fluid seen subjacent to the right hemidiaphragm.  The quantity of pleural fluid identified is insufficient for thoracentesis.  IMPRESSION: Right pleural effusion and larger component of right lung consolidation on ultrasound.  Pleural fluid component quantity insufficient for thoracentesis at this time.  Original Report Authenticated By: Randall An, M.D.   US Abdomen Complete  03/28/2012  *RADIOLOGY REPORT*  Clinical Data:  Elevated LFTs.  Right upper quadrant pain and nausea.  ABDOMINAL ULTRASOUND COMPLETE  Comparison:  03/12/2012  Findings:  Gallbladder:  No gallstones, gallbladder wall thickening, or pericholecystic fluid. Negative sonographic Murphy's sign reported by the sonographer.  Common Bile Duct:  Within normal limits in  caliber.  Liver: No focal mass lesion identified.  Within normal limits in parenchymal echogenicity.  IVC:  Appears normal.  Pancreas:  No abnormality identified.  Spleen:  Within normal limits in size and echotexture.  Right kidney:  Normal in size and parenchymal echogenicity.  No evidence of mass or hydronephrosis.  Left kidney:  Normal in size and parenchymal echogenicity.  No evidence of mass or hydronephrosis.  Abdominal Aorta:  No aneurysm identified.  Other findings:  Small bilateral pleural effusions.  Small amount of upper abdominal ascites posterior to the liver and spleen.  IMPRESSION:  1.  Small amount of abdominal ascites. 2.  Small pleural effusions.  Original Report Authenticated By: Angelita Ingles, M.D.   US Abdomen Complete  03/12/2012  *RADIOLOGY REPORT*  Clinical Data:  Right upper quadrant abdominal pain, elevated liver function tests  ABDOMINAL ULTRASOUND COMPLETE  Comparison:  Chest CT 03/11/2012 with incomplete visualization of the liver  Findings:  Gallbladder:  Minimal dependent sludge is noted within the gallbladder.  The gallbladder is not distended, with gallbladder wall thickness measuring borderline thickened at 4 mm.  No shadowing calculus or sonographic Murphy's sign noted.  Common Bile Duct:  Within normal limits in caliber.  Liver: No focal mass lesion identified.  Within normal limits in parenchymal echogenicity.  IVC:  Appears normal.  Pancreas:  No abnormality identified.  Spleen:  Within normal limits in size and echotexture.  Right kidney:  Normal in size and parenchymal echogenicity.  No evidence of mass or hydronephrosis.  Left kidney:  Normal in size and parenchymal echogenicity.  No evidence of mass or hydronephrosis.  Abdominal Aorta:  Small amount of ascites is noted.  Trace left pleural effusion.  IMPRESSION: Minimal sludge within the gallbladder with borderline wall thickening.  This could indicate cholecystitis in the appropriate clinical context.  Trace ascites.   Original Report Authenticated By: Arline Asp, M.D.   Ct Maxillofacial W/cm  04/01/2012  *RADIOLOGY REPORT*  Clinical Data: Swelling on the right for several weeks.  Jaw pain.  CT MAXILLOFACIAL WITH CONTRAST  Technique:  Multidetector CT imaging of the maxillofacial structures was performed with intravenous contrast. Multiplanar CT image reconstructions were also generated.  Contrast: 40mL OMNIPAQUE IOHEXOL 300 MG/ML  SOLN  Comparison: None.  Findings: In the right buccal region, there is a complex fluid collection spanning over 5 x 2.1 x 1.1 cm.  This is immediately adjacent to the significant dental disease.  Mild cervical spondylotic changes.  Visualized mastoid air cells, middle ear cavities and paranasal sinuses are clear.  IMPRESSION: Probable abscess right buccal region most likely dental in origin given the degree of significant caries and periapical lucency of the molars.  Results called to Bari Mantis patient's  nurse 04/01/2012 3:20 a.m. with request to call report first in the morning.  Original Report Authenticated By: Doug Sou, M.D.   Dg Chest Port 1 View  03/31/2012  *RADIOLOGY REPORT*  Clinical Data: Assess infiltrate.  PORTABLE CHEST - 1 VIEW  Comparison: 03/28/2012  Findings: Very low lung volumes.  Worsening bibasilar opacities and probable edema.  Bilateral effusions.  Cardiomegaly.  IMPRESSION: Increasing pulmonary edema, bibasilar opacities and effusions. Very low lung volumes.  Original Report Authenticated By: Raelyn Number, M.D.    Scheduled Meds:   . sodium chloride   Intravenous Once  . atorvastatin  20 mg Oral q1800  . docusate sodium  100 mg Oral BID  . furosemide  20 mg Intravenous Q12H  . insulin aspart  0-9 Units Subcutaneous TID WC  . insulin aspart  11 Units Subcutaneous TID AC  . insulin glargine  40 Units Subcutaneous QHS  . pantoprazole  40 mg Oral Daily  . piperacillin-tazobactam (ZOSYN)  IV  3.375 g Intravenous Q8H  . polyethylene  glycol-electrolytes  4,000 mL Oral Once  . senna  1 tablet Oral BID  . sodium chloride  3 mL Intravenous Q12H  . vancomycin  1,000 mg Intravenous Q8H  . DISCONTD: pantoprazole  40 mg Oral BID AC  . DISCONTD: piperacillin-tazobactam (ZOSYN)  IV  3.375 g Intravenous Q8H  . DISCONTD: polyethylene glycol-electrolytes  4,000 mL Oral Once   Continuous Infusions:  PRN Meds:.sodium chloride, acetaminophen, acetaminophen, iohexol, morphine, ondansetron (ZOFRAN) IV, ondansetron, oxyCODONE, sodium chloride, DISCONTD: fentaNYL, DISCONTD: midazolam  Assessment & Plan   Brief narrative:  46 year old man recently treated for streptococcus pneumoniae bacteremia and pneumonia. Presented with cough, right-sided chest pain.    Chart review:  03/11/2012-03/16/2012 hospitalization: Streptococcus pneumoniae bacteremia, pneumonia, acute renal failure, elevated LFTs.   Past medical history:  Diabetes mellitus    Consultations:  Pulmonology Gastroenterology ENT  Physical therapy: Home health   Procedures:  April 9: Upper endoscopy: LA grade C esophagitis. Protonix 40 mg by mouth twice a day.  April 10: Colonoscopy   Antibiotics:  April 6: Vancomycin  April 6: Zosyn      #1. 46 y.o. male admitted on 03/27/2012, with recurrent pneumonia along with pleural effusion concerning for empyema, recent admission forStreptococcus pneumoniae bacteremia and was discharged on IV Rocephin, now comes back with fever chills. Pharmacy consulted to manage vancomycin and Zosyn (currently day 5). Noted plans for thoracentesis and possible referral for VATS. Based on repeat chest ultrasound results on April 11-20 13. Appreciate pulmonary input, we'll consult ID once oral surgery he sees the patient today. He should is HIV negative.     #2.Mixed pattern elevated LFTs: Etiology not clear. Abdominal ultrasound unremarkable, hepatitis panel negative and recent HIDA scan was negative. Possibly related to effusion.  Continue to follow.     #3. Hyponatremia: Stable. Likely related to current pneumonia/pulmonary process.     #4. Anemia: No history of bleeding. Fecal occult blood negative. No previous data prior to last hospitalization when he presented with hemoglobin of 10 which quickly decreased, probably from dilution. Certainly a component of this is likely to be secondary to acute illness but I am skeptical that acute illness totally accounts for it. Appreciate gastroenterology evaluation, EGD showing grade C. esophagitis placed on PPI, due for colonoscopy later today.     #5. Diabetes mellitus type 2, uncontrolled: Continue sliding scale insulin, Lantus, meal coverage.     #6. Right buccal mucosa mass: This developed during his last hospitalization.  Etiology unclear. Discussed with Dr. Murriel Hopper to his sedation have called oral surgeon Dr. Hoyt Koch who will see the patient later today for now continue vancomycin and Zosyn.  .   #7. Acute renal failure with edema - renal failure almost resolved, I edema improving with Lasix, will switch to by mouth Lasix, will add TED stockings and SCDs.    DVT Prophylaxis  SCDs      Thurnell Lose M.D on 04/01/2012 at 12:56 PM  [facility] Office  [phone]

## 2012-04-01 NOTE — Consult Note (Signed)
Reason for Consult: Right cheek/lip swelling   [name] [name] is an 46 y.o. male.  CC: Swelling right cheek  HPI: Pt admitted for Bilateral strep pneumoniae on 03/27/2012. Pt states swelling right cheek began about that time. No dental pain.  Past Medical History  Diagnosis Date  . Diabetes mellitus   . Pneumonia 02/2012    Strep pneumoniae bilateral pneumonia complicated by bacteremia  . Bacteremia     Past Surgical History  Procedure Date  . Esophagogastroduodenoscopy 03/31/2012    Procedure: ESOPHAGOGASTRODUODENOSCOPY (EGD);  Surgeon: Beryle Beams, MD;  Location: [facility] ENDOSCOPY;  Service: Endoscopy;  Laterality: N/A;    History reviewed. No pertinent family history.  Social History:  reports that he has never smoked. He has never used smokeless tobacco. He reports that he does not drink alcohol or use illicit drugs.  Allergies:  Allergies  Allergen Reactions  . Penicillins Rash    Tolerating Ceftriaxone (03/12/12)    Medications: I have reviewed the patient's current medications.  Results for orders placed during the hospital encounter of 03/27/12 (from the past 48 hour(s))  GLUCOSE, CAPILLARY     Status: Abnormal   Collection Time   03/30/12 11:53 AM      Component Value Range Comment   Glucose-Capillary 233 (*) 70 - 99 (mg/dL)   GLUCOSE, CAPILLARY     Status: Abnormal   Collection Time   03/30/12  4:20 PM      Component Value Range Comment   Glucose-Capillary 197 (*) 70 - 99 (mg/dL)   GLUCOSE, CAPILLARY     Status: Abnormal   Collection Time   03/30/12  9:44 PM      Component Value Range Comment   Glucose-Capillary 175 (*) 70 - 99 (mg/dL)    Comment 1 Documented in Chart      Comment 2 Notify RN     OSMOLALITY, URINE     Status: Abnormal   Collection Time   03/31/12 12:20 AM      Component Value Range Comment   Osmolality, Ur 353 (*) 390 - 1090 (mOsm/kg)   SODIUM, URINE, RANDOM     Status: Normal   Collection Time   03/31/12 12:21 AM      Component Value Range  Comment   Sodium, Ur 71     OCCULT BLOOD X 1 CARD TO LAB, STOOL     Status: Normal   Collection Time   03/31/12  3:20 AM      Component Value Range Comment   Fecal Occult Bld NEGATIVE     CBC     Status: Abnormal   Collection Time   03/31/12  5:00 AM      Component Value Range Comment   WBC 10.1  4.0 - 10.5 (K/uL)    RBC 2.78 (*) 4.22 - 5.81 (MIL/uL)    Hemoglobin 8.0 (*) 13.0 - 17.0 (g/dL)    HCT 23.8 (*) 39.0 - 52.0 (%)    MCV 85.6  78.0 - 100.0 (fL)    MCH 28.8  26.0 - 34.0 (pg)    MCHC 33.6  30.0 - 36.0 (g/dL)    RDW 15.7 (*) 11.5 - 15.5 (%)    Platelets 281  150 - 400 (K/uL)   COMPREHENSIVE METABOLIC PANEL     Status: Abnormal   Collection Time   03/31/12  5:00 AM      Component Value Range Comment   Sodium 130 (*) 135 - 145 (mEq/L)    Potassium 4.2  3.5 - 5.1 (mEq/L)    Chloride 95 (*) 96 - 112 (mEq/L)    CO2 25  19 - 32 (mEq/L)    Glucose, Bld 250 (*) 70 - 99 (mg/dL)    BUN 31 (*) 6 - 23 (mg/dL)    Creatinine, Ser 1.10  0.50 - 1.35 (mg/dL)    Calcium 8.7  8.4 - 10.5 (mg/dL)    Total Protein 6.7  6.0 - 8.3 (g/dL)    Albumin 1.9 (*) 3.5 - 5.2 (g/dL)    AST 103 (*) 0 - 37 (U/L)    ALT 127 (*) 0 - 53 (U/L)    Alkaline Phosphatase 792 (*) 39 - 117 (U/L)    Total Bilirubin 0.6  0.3 - 1.2 (mg/dL)    GFR calc non Af Amer 79 (*) >90 (mL/min)    GFR calc Af Amer >90  >90 (mL/min)   MAGNESIUM     Status: Normal   Collection Time   03/31/12  5:00 AM      Component Value Range Comment   Magnesium 1.9  1.5 - 2.5 (mg/dL)   PHOSPHORUS     Status: Normal   Collection Time   03/31/12  5:00 AM      Component Value Range Comment   Phosphorus 3.7  2.3 - 4.6 (mg/dL)   GLUCOSE, CAPILLARY     Status: Abnormal   Collection Time   03/31/12  7:06 AM      Component Value Range Comment   Glucose-Capillary 211 (*) 70 - 99 (mg/dL)    Comment 1 Documented in Chart      Comment 2 Notify RN     GLUCOSE, CAPILLARY     Status: Abnormal   Collection Time   03/31/12 11:01 AM      Component Value Range  Comment   Glucose-Capillary 116 (*) 70 - 99 (mg/dL)    Comment 1 Documented in Chart      Comment 2 Notify RN     GLUCOSE, CAPILLARY     Status: Normal   Collection Time   03/31/12  4:56 PM      Component Value Range Comment   Glucose-Capillary 75  70 - 99 (mg/dL)   GLUCOSE, CAPILLARY     Status: Abnormal   Collection Time   03/31/12 10:40 PM      Component Value Range Comment   Glucose-Capillary 275 (*) 70 - 99 (mg/dL)   CBC     Status: Abnormal   Collection Time   04/01/12  6:30 AM      Component Value Range Comment   WBC 9.1  4.0 - 10.5 (K/uL)    RBC 2.86 (*) 4.22 - 5.81 (MIL/uL)    Hemoglobin 8.2 (*) 13.0 - 17.0 (g/dL)    HCT 25.0 (*) 39.0 - 52.0 (%)    MCV 87.4  78.0 - 100.0 (fL)    MCH 28.7  26.0 - 34.0 (pg)    MCHC 32.8  30.0 - 36.0 (g/dL)    RDW 15.4  11.5 - 15.5 (%)    Platelets 313  150 - 400 (K/uL)   COMPREHENSIVE METABOLIC PANEL     Status: Abnormal   Collection Time   04/01/12  6:30 AM      Component Value Range Comment   Sodium 136  135 - 145 (mEq/L)    Potassium 3.5  3.5 - 5.1 (mEq/L)    Chloride 98  96 - 112 (mEq/L)    CO2 30  19 - 32 (  mEq/L)    Glucose, Bld 99  70 - 99 (mg/dL)    BUN 18  6 - 23 (mg/dL) DELTA CHECK NOTED   Creatinine, Ser 0.91  0.50 - 1.35 (mg/dL)    Calcium 8.7  8.4 - 10.5 (mg/dL)    Total Protein 6.8  6.0 - 8.3 (g/dL)    Albumin 2.0 (*) 3.5 - 5.2 (g/dL)    AST 46 (*) 0 - 37 (U/L)    ALT 91 (*) 0 - 53 (U/L)    Alkaline Phosphatase 714 (*) 39 - 117 (U/L)    Total Bilirubin 0.5  0.3 - 1.2 (mg/dL)    GFR calc non Af Amer >90  >90 (mL/min)    GFR calc Af Amer >90  >90 (mL/min)   GLUCOSE, CAPILLARY     Status: Abnormal   Collection Time   04/01/12  7:04 AM      Component Value Range Comment   Glucose-Capillary 153 (*) 70 - 99 (mg/dL)   GLUCOSE, CAPILLARY     Status: Normal   Collection Time   04/01/12 11:29 AM      Component Value Range Comment   Glucose-Capillary 88  70 - 99 (mg/dL)     Ct Maxillofacial W/cm  04/01/2012  *RADIOLOGY  REPORT*  Clinical Data: Swelling on the right for several weeks.  Jaw pain.  CT MAXILLOFACIAL WITH CONTRAST  Technique:  Multidetector CT imaging of the maxillofacial structures was performed with intravenous contrast. Multiplanar CT image reconstructions were also generated.  Contrast: 44mL OMNIPAQUE IOHEXOL 300 MG/ML  SOLN  Comparison: None.  Findings: In the right buccal region, there is a complex fluid collection spanning over 5 x 2.1 x 1.1 cm.  This is immediately adjacent to the significant dental disease.  Mild cervical spondylotic changes.  Visualized mastoid air cells, middle ear cavities and paranasal sinuses are clear.  IMPRESSION: Probable abscess right buccal region most likely dental in origin given the degree of significant caries and periapical lucency of the molars.  Results called to Bari Mantis patient's nurse 04/01/2012 3:20 a.m. with request to call report first in the morning.  Original Report Authenticated By: Doug Sou, M.D.   Dg Chest Port 1 View  03/31/2012  *RADIOLOGY REPORT*  Clinical Data: Assess infiltrate.  PORTABLE CHEST - 1 VIEW  Comparison: 03/28/2012  Findings: Very low lung volumes.  Worsening bibasilar opacities and probable edema.  Bilateral effusions.  Cardiomegaly.  IMPRESSION: Increasing pulmonary edema, bibasilar opacities and effusions. Very low lung volumes.  Original Report Authenticated By: Raelyn Number, M.D.    @ROS @ Blood pressure 182/105, pulse 109, temperature 98 F (36.7 C), temperature source Oral, resp. rate 20, height 6\' 2"  (1.88 m), weight 86.4 kg (190 lb 7.6 oz), SpO2 94.00%. General appearance: alert, cooperative and no distress Head: Normocephalic, without obvious abnormality, atraumatic Eyes: negative Ears: normal TM's and external ear canals both ears Nose: Nares normal. Septum midline. Mucosa normal. No drainage or sinus tenderness. Throat: lips, mucosa, and tongue normal; teeth and gums normal and 5.o cm by 2.0 cm by 1.0 cm firm  swelling right buccal cheek mucosa. No obvious dental caries on exam. Negative trismus. Unable to express saliva from parotid manipulation Right.  Neck: no adenopathy, no carotid bruit, no JVD, supple, symmetrical, trachea midline and thyroid not enlarged, symmetric, no tenderness/mass/nodules Resp: clear to auscultation bilaterally Cardio: regular rate and rhythm, S1, S2 normal, no murmur, click, rub or gallop  Assessment/Plan: 46 Year Old admitted for Bilateral pneumonia with  right cheek swelling. Possible dental vs parotid etiology. Will order panorex and evaluate for dental abscesses. Patient may require I and D bedside with local anesthesia.   Kamee Bobst M 04/01/2012, 11:48 AM

## 2012-04-01 NOTE — Progress Notes (Signed)
Physical Therapy Treatment Patient Details Name: [name] [name] MRN: [id number] DOB: Aug 20, 1966 Today's Date: 04/01/2012  PT Assessment/Plan  PT - Assessment/Plan Comments on Treatment Session: Pt was able to increase amb without an assistive device. Pt had LOB 3x and need standing breaks to regain himself with sudden turns and changes in gait speed.  PT Plan: Discharge plan remains appropriate;Frequency remains appropriate PT Frequency: Min 3X/week Follow Up Recommendations: Home health PT;Outpatient PT Equipment Recommended: Rolling walker with 5" wheels PT Goals  Acute Rehab PT Goals PT Goal: Supine/Side to Sit - Progress: Progressing toward goal PT Goal: Sit to Stand - Progress: Progressing toward goal PT Goal: Stand to Sit - Progress: Progressing toward goal PT Goal: Stand - Progress: Progressing toward goal PT Goal: Ambulate - Progress: Progressing toward goal  PT Treatment Precautions/Restrictions  Precautions Precautions: Fall Required Braces or Orthoses: No Restrictions Weight Bearing Restrictions: No Mobility (including Balance) Bed Mobility Bed Mobility: Yes Sit to Supine: 6: Modified independent (Device/Increase time) Transfers Transfers: Yes Sit to Stand: 6: Modified independent (Device/Increase time) Stand to Sit: 6: Modified independent (Device/Increase time) Ambulation/Gait Ambulation/Gait: Yes Ambulation/Gait Assistance: 4: Min assist (Minguard A for safety during LOB) Ambulation/Gait Assistance Details (indicate cue type and reason): Pt had LOB 3x but was able to regain his balance with Min assist. Pt took 3 standing breaks after changes in gait speed and sudden turns.   Ambulation Distance (Feet): 800 Feet Assistive device: None Gait Pattern: Within Functional Limits;Step-through pattern Stairs: No Wheelchair Mobility Wheelchair Mobility: No    Exercise    End of Session PT - End of Session Equipment Utilized During Treatment: Gait  belt Activity Tolerance: Patient tolerated treatment well Patient left: in bed Nurse Communication: Mobility status for transfers;Mobility status for ambulation General Behavior During Session: [facility] for tasks performed Cognition: [place] for tasks performed  Cathlean Cower 04/01/2012, 2:57 PM

## 2012-04-01 NOTE — Progress Notes (Signed)
CBG: 68  Treatment: D50 IV 25 mL  Symptoms: Shaky, Hungry  Follow-up CBG: Time:1445 CBG Result 86  Possible Reasons for Event: Inadequate meal intake  Comments/MD notified:Pt NPO since MN for procedures. Will continue to monitor.     Romie Levee, RN

## 2012-04-01 NOTE — Op Note (Signed)
[facility] [place], [place]  [id number]  OPERATIVE PROCEDURE REPORT  PATIENT:  [name] [name], [name] [name]  MR#:  [id number] BIRTHDATE:  [birth year]  GENDER:  male ENDOSCOPIST:  Dr. Edmonia James, MD ASSISTANT:  Cherylynn Ridges, technician and Wray Kearns, RN.  PROCEDURE DATE:  04/01/2012 PRE-PROCEDURE PREPERATION:  The patient was prepped with a gallon of Golytely the night prior to the procedure.  The patient was fasted for 8 hours prior to the procedure. PRE-PROCEDURE PHYSICAL:  Patient has stable vital signs. Neck is supple. There is no JVD, thyromegaly or LAD. Chest clear to auscultation. S1 and S2 regular. Abdomen soft, non-distended, non-tender with NABS. PROCEDURE:  Colonoscopy with hot snare polypectomy x 1. ASA CLASS:  Class III INDICATIONS:  1) Iron deficiency anemia 2) Abnormal weight loss 3) CRC screening. MEDICATIONS:  Fentanyl 100 mcg & Versed 8 mg IV.  DESCRIPTION OF PROCEDURE: After the risks, benefits, and alternatives of the procedure were thoroughly explained [including a 10% missed rate of cancer and polyps], informed consent was obtained.  Digital rectal exam was performed.  The Pentax video colonoscope [id number] was introduced through the anus and advanced to the cecum, which was identified by both the appendix and ileocecal valve, limited by a lot f residual stool in the colon. The quality of the prep was fair, at best Multiple washes were done. Small lesions could be missed. The instrument was then slowly withdrawn as the colon was fully examined. <<PROCEDUREIMAGES>>  FINDINGS:  There was a 75mm sessile, mid-right colon polyp that was removed by a hot snare x 1 [150/15] The rest of the colonic mucosa appeared healthy with a normal vascular pattern. No masses, diverticula or AVM's were noted.  The appendiceal orifice and the ICV were identified and photographed. Retroflexed views revealed no abnormalities. The patient tolerated  the procedure without immediate complications.  The scope was then withdrawn from the patient and the procedure terminated.  IMPRESSION:  One 9 mm sessile right colon polyp removed by hot snare x 1; otherwise normal colonoscopy up to the cecum; there was a lot of residual stool in the colon-multiple washes wre done-small lesions could be missed.  RECOMMENDATIONS:  1) Hold all NSAIDS for 2 weeks. 2) Await pathology results.  3) Continue surveillance. 4) Serial CBC's. 5) Resume regular diet.  REPEAT EXAM:  In 3 years with a 3 day prep; in case the patient has any abnormal GI symptoms in the interim, she should contact the office immediately for further recommendations.  DISCHARGE INSTRUCTIONS:  Standard discharge instructions given.  ______________________________ Dr. Edmonia James, MD  CPT CODES: [phone]  DIAGNOSIS CODES:  211.3, 280.9, 783.21, V76.51  CC:  [facility]  n. eSIGNED:   Dr. Edmonia James at 04/01/2012 04:37 PM  [name] [name], [name] [name], [id number]

## 2012-04-01 NOTE — Consult Note (Signed)
ENT CONSULT:  Reason for Consult:Right Cheek swelling  Referring Physician: [facility] Kordel Arriola is an 46 y.o. male.  HPI: Pt reports several week hx of gradual swelling in the Rt cheek and perilabial area. Nontender and min symptoms. No hx of trauma. Tol po and chewing. Hx of poor dental care.  Past Medical History  Diagnosis Date  . Diabetes mellitus   . Pneumonia 02/2012    Strep pneumoniae bilateral pneumonia complicated by bacteremia  . Bacteremia     Past Surgical History  Procedure Date  . Esophagogastroduodenoscopy 03/31/2012    Procedure: ESOPHAGOGASTRODUODENOSCOPY (EGD);  Surgeon: Beryle Beams, MD;  Location: [facility] ENDOSCOPY;  Service: Endoscopy;  Laterality: N/A;    History reviewed. No pertinent family history.  Social History:  reports that he has never smoked. He has never used smokeless tobacco. He reports that he does not drink alcohol or use illicit drugs.  Allergies:  Allergies  Allergen Reactions  . Penicillins Rash    Tolerating Ceftriaxone (03/12/12)    Medications: I have reviewed the patient's current medications.  Results for orders placed during the hospital encounter of 03/27/12 (from the past 48 hour(s))  GLUCOSE, CAPILLARY     Status: Abnormal   Collection Time   03/30/12 11:53 AM      Component Value Range Comment   Glucose-Capillary 233 (*) 70 - 99 (mg/dL)   GLUCOSE, CAPILLARY     Status: Abnormal   Collection Time   03/30/12  4:20 PM      Component Value Range Comment   Glucose-Capillary 197 (*) 70 - 99 (mg/dL)   GLUCOSE, CAPILLARY     Status: Abnormal   Collection Time   03/30/12  9:44 PM      Component Value Range Comment   Glucose-Capillary 175 (*) 70 - 99 (mg/dL)    Comment 1 Documented in Chart      Comment 2 Notify RN     OSMOLALITY, URINE     Status: Abnormal   Collection Time   03/31/12 12:20 AM      Component Value Range Comment   Osmolality, Ur 353 (*) 390 - 1090 (mOsm/kg)   SODIUM, URINE, RANDOM     Status: Normal   Collection Time   03/31/12 12:21 AM      Component Value Range Comment   Sodium, Ur 71     OCCULT BLOOD X 1 CARD TO LAB, STOOL     Status: Normal   Collection Time   03/31/12  3:20 AM      Component Value Range Comment   Fecal Occult Bld NEGATIVE     CBC     Status: Abnormal   Collection Time   03/31/12  5:00 AM      Component Value Range Comment   WBC 10.1  4.0 - 10.5 (K/uL)    RBC 2.78 (*) 4.22 - 5.81 (MIL/uL)    Hemoglobin 8.0 (*) 13.0 - 17.0 (g/dL)    HCT 23.8 (*) 39.0 - 52.0 (%)    MCV 85.6  78.0 - 100.0 (fL)    MCH 28.8  26.0 - 34.0 (pg)    MCHC 33.6  30.0 - 36.0 (g/dL)    RDW 15.7 (*) 11.5 - 15.5 (%)    Platelets 281  150 - 400 (K/uL)   COMPREHENSIVE METABOLIC PANEL     Status: Abnormal   Collection Time   03/31/12  5:00 AM      Component Value Range Comment  Sodium 130 (*) 135 - 145 (mEq/L)    Potassium 4.2  3.5 - 5.1 (mEq/L)    Chloride 95 (*) 96 - 112 (mEq/L)    CO2 25  19 - 32 (mEq/L)    Glucose, Bld 250 (*) 70 - 99 (mg/dL)    BUN 31 (*) 6 - 23 (mg/dL)    Creatinine, Ser 1.10  0.50 - 1.35 (mg/dL)    Calcium 8.7  8.4 - 10.5 (mg/dL)    Total Protein 6.7  6.0 - 8.3 (g/dL)    Albumin 1.9 (*) 3.5 - 5.2 (g/dL)    AST 103 (*) 0 - 37 (U/L)    ALT 127 (*) 0 - 53 (U/L)    Alkaline Phosphatase 792 (*) 39 - 117 (U/L)    Total Bilirubin 0.6  0.3 - 1.2 (mg/dL)    GFR calc non Af Amer 79 (*) >90 (mL/min)    GFR calc Af Amer >90  >90 (mL/min)   MAGNESIUM     Status: Normal   Collection Time   03/31/12  5:00 AM      Component Value Range Comment   Magnesium 1.9  1.5 - 2.5 (mg/dL)   PHOSPHORUS     Status: Normal   Collection Time   03/31/12  5:00 AM      Component Value Range Comment   Phosphorus 3.7  2.3 - 4.6 (mg/dL)   GLUCOSE, CAPILLARY     Status: Abnormal   Collection Time   03/31/12  7:06 AM      Component Value Range Comment   Glucose-Capillary 211 (*) 70 - 99 (mg/dL)    Comment 1 Documented in Chart      Comment 2 Notify RN     GLUCOSE, CAPILLARY     Status: Abnormal    Collection Time   03/31/12 11:01 AM      Component Value Range Comment   Glucose-Capillary 116 (*) 70 - 99 (mg/dL)    Comment 1 Documented in Chart      Comment 2 Notify RN     GLUCOSE, CAPILLARY     Status: Normal   Collection Time   03/31/12  4:56 PM      Component Value Range Comment   Glucose-Capillary 75  70 - 99 (mg/dL)   GLUCOSE, CAPILLARY     Status: Abnormal   Collection Time   03/31/12 10:40 PM      Component Value Range Comment   Glucose-Capillary 275 (*) 70 - 99 (mg/dL)   CBC     Status: Abnormal   Collection Time   04/01/12  6:30 AM      Component Value Range Comment   WBC 9.1  4.0 - 10.5 (K/uL)    RBC 2.86 (*) 4.22 - 5.81 (MIL/uL)    Hemoglobin 8.2 (*) 13.0 - 17.0 (g/dL)    HCT 25.0 (*) 39.0 - 52.0 (%)    MCV 87.4  78.0 - 100.0 (fL)    MCH 28.7  26.0 - 34.0 (pg)    MCHC 32.8  30.0 - 36.0 (g/dL)    RDW 15.4  11.5 - 15.5 (%)    Platelets 313  150 - 400 (K/uL)   COMPREHENSIVE METABOLIC PANEL     Status: Abnormal   Collection Time   04/01/12  6:30 AM      Component Value Range Comment   Sodium 136  135 - 145 (mEq/L)    Potassium 3.5  3.5 - 5.1 (mEq/L)    Chloride 98  96 - 112 (mEq/L)    CO2 30  19 - 32 (mEq/L)    Glucose, Bld 99  70 - 99 (mg/dL)    BUN 18  6 - 23 (mg/dL) DELTA CHECK NOTED   Creatinine, Ser 0.91  0.50 - 1.35 (mg/dL)    Calcium 8.7  8.4 - 10.5 (mg/dL)    Total Protein 6.8  6.0 - 8.3 (g/dL)    Albumin 2.0 (*) 3.5 - 5.2 (g/dL)    AST 46 (*) 0 - 37 (U/L)    ALT 91 (*) 0 - 53 (U/L)    Alkaline Phosphatase 714 (*) 39 - 117 (U/L)    Total Bilirubin 0.5  0.3 - 1.2 (mg/dL)    GFR calc non Af Amer >90  >90 (mL/min)    GFR calc Af Amer >90  >90 (mL/min)   GLUCOSE, CAPILLARY     Status: Abnormal   Collection Time   04/01/12  7:04 AM      Component Value Range Comment   Glucose-Capillary 153 (*) 70 - 99 (mg/dL)     Ct Maxillofacial W/cm  04/01/2012  *RADIOLOGY REPORT*  Clinical Data: Swelling on the right for several weeks.  Jaw pain.  CT MAXILLOFACIAL  WITH CONTRAST  Technique:  Multidetector CT imaging of the maxillofacial structures was performed with intravenous contrast. Multiplanar CT image reconstructions were also generated.  Contrast: 64mL OMNIPAQUE IOHEXOL 300 MG/ML  SOLN  Comparison: None.  Findings: In the right buccal region, there is a complex fluid collection spanning over 5 x 2.1 x 1.1 cm.  This is immediately adjacent to the significant dental disease.  Mild cervical spondylotic changes.  Visualized mastoid air cells, middle ear cavities and paranasal sinuses are clear.  IMPRESSION: Probable abscess right buccal region most likely dental in origin given the degree of significant caries and periapical lucency of the molars.  Results called to Bari Mantis patient's nurse 04/01/2012 3:20 a.m. with request to call report first in the morning.  Original Report Authenticated By: Doug Sou, M.D.   Dg Chest Port 1 View  03/31/2012  *RADIOLOGY REPORT*  Clinical Data: Assess infiltrate.  PORTABLE CHEST - 1 VIEW  Comparison: 03/28/2012  Findings: Very low lung volumes.  Worsening bibasilar opacities and probable edema.  Bilateral effusions.  Cardiomegaly.  IMPRESSION: Increasing pulmonary edema, bibasilar opacities and effusions. Very low lung volumes.  Original Report Authenticated By: Raelyn Number, M.D.    ROS:@ROS @  Blood pressure 182/105, pulse 109, temperature 98 F (36.7 C), temperature source Oral, resp. rate 20, height 6\' 2"  (1.88 m), weight 86.4 kg (190 lb 7.6 oz), SpO2 94.00%.  PHYSICAL EXAM: Mouth - mucous membranes moist, pharynx normal without lesions.  Sig soft swelling, Non-tender, no erythema  No apparent dental infection, Neck - No LA  Studies Reviewed:CT MaxilloFacial reviewed as above  Assessment/Plan: Perioral swelling c/w fluid collection vs low-grade infection. Cont current Abx tx. ?etiology, based on CT findings c/w dental/periapical abscess with soft tissue extension. Rec Oral Surgery eval., possible dental  extraction and drainage absc. Will schedule panorex x-ray. Please reconsult as needed.  [place], Katelen Luepke 04/01/2012, 8:44 AM

## 2012-04-01 NOTE — Consult Note (Signed)
Patient: [name] [name] DOB: 1966-11-16 Date of Admission: 03/27/2012 PCP - [name], MD            Pulmonary consult  Date of Consult: 04/01/2012 MD requesting consult:  [facility] Sarajane Jews) Reason for consult: Recurrent PNA  HPI - 46yo male with hx poorly controlled DM with recent admission for PNA and strep bacteremia c/b acute renal failure. He was d/c 3/25 with PICC for Ceftriaxone through 4/2.  He returned 4/6 with cough, SOB and R sided pleuritic chest pain.  Re-admitted by [facility] with recurrent PNA, R>L pleural effusions, DM and anemia.  PCCM consulted 4/8 for assist and ?FOB.   Subjective: - he feels better since being restarted on abx (currently vanco + pip/tazo) - attempt made at R thora under [place] but not enough fluid visualized to tap - planning for CSY today, underwent EGD 4/9 - to be seen by oral surgeon re R lip abscess (seen on CT scan)  Filed Vitals:   03/31/12 1540 03/31/12 1545 03/31/12 1600 03/31/12 2244  BP: 168/89 151/81 145/81 182/105  Pulse:    109  Temp:    98 F (36.7 C)  TempSrc:    Oral  Resp: 28 32 6 20  Height:      Weight:      SpO2: 94% 93% 95% 94%    chest X-ray Ct Maxillofacial W/cm  04/01/2012  *RADIOLOGY REPORT*  Clinical Data: Swelling on the right for several weeks.  Jaw pain.  CT MAXILLOFACIAL WITH CONTRAST  Technique:  Multidetector CT imaging of the maxillofacial structures was performed with intravenous contrast. Multiplanar CT image reconstructions were also generated.  Contrast: 2mL OMNIPAQUE IOHEXOL 300 MG/ML  SOLN  Comparison: None.  Findings: In the right buccal region, there is a complex fluid collection spanning over 5 x 2.1 x 1.1 cm.  This is immediately adjacent to the significant dental disease.  Mild cervical spondylotic changes.  Visualized mastoid air cells, middle ear cavities and paranasal sinuses are clear.  IMPRESSION: Probable abscess right buccal region most likely dental in origin given the degree of significant  caries and periapical lucency of the molars.  Results called to Bari Mantis patient's nurse 04/01/2012 3:20 a.m. with request to call report first in the morning.  Original Report Authenticated By: Doug Sou, M.D.   Dg Chest Port 1 View  03/31/2012  *RADIOLOGY REPORT*  Clinical Data: Assess infiltrate.  PORTABLE CHEST - 1 VIEW  Comparison: 03/28/2012  Findings: Very low lung volumes.  Worsening bibasilar opacities and probable edema.  Bilateral effusions.  Cardiomegaly.  IMPRESSION: Increasing pulmonary edema, bibasilar opacities and effusions. Very low lung volumes.  Original Report Authenticated By: Raelyn Number, M.D.    CT chest 4/6>>>Right upper/lower lobe pneumonia, with associated moderate right pleural effusion. Patchy left lower lobe opacity, atelectasis versus pneumonia, with associated small left pleural effusion. No obvious endobronchial lesion CBC    Component Value Date/Time   WBC 9.1 04/01/2012 0630   RBC 2.86* 04/01/2012 0630   HGB 8.2* 04/01/2012 0630   HCT 25.0* 04/01/2012 0630   PLT 313 04/01/2012 0630   MCV 87.4 04/01/2012 0630   MCH 28.7 04/01/2012 0630   MCHC 32.8 04/01/2012 0630   RDW 15.4 04/01/2012 0630   BMET    Component Value Date/Time   NA 136 04/01/2012 0630   K 3.5 04/01/2012 0630   CL 98 04/01/2012 0630   CO2 30 04/01/2012 0630   GLUCOSE 99 04/01/2012 0630   BUN 18 04/01/2012  0630   CREATININE 0.91 04/01/2012 0630   CALCIUM 8.7 04/01/2012 0630   GFRNONAA >90 04/01/2012 0630   GFRAA >90 04/01/2012 0630   EXAM: General: pleasant male, NAD HEENT: mm moist, R buccal mucosa mass Neuro: awake, alert, appropriate CV: s1s2 rrr PULM: resps even, non labored on RA, diminished R>L base, few scattered ronchi GI: abd soft, non tender, +bs Extremities:  Warm and dry, 1-2+ BLE edema, pale  IMPRESSION/ PLAN:  1. Recurrent PNA -- Recent strep pneumoniae PNA and bacteremia.  D/c home 3/25 with cont IV ceftriaxone through 4/2 per ID.  Worsened 4/6.  Persistent R sided PNA  with associated effusion.  This could reflect insufficient treatment time given the severity of the PNA, possible empyema or abscess development.  HIV neg last admit. No significant risk factors, never smoker.  However given mult abnormalities (transaminitis, significant anemia, etc) does increase concern for underlying malignancy and 20 lbs wt loss unexplained! No clear endobronchial lesion or abscess on CT scan 4/6, he does have new R effusion.   Lab 04/01/12 0630 03/31/12 0500 03/30/12 0530 03/29/12 0555 03/28/12 0007  WBC 9.1 10.1 10.6* 13.7* 19.6*  PLAN -  Cont IV abx (now on vanc, zosyn) ID should see this patient to help guide duration of therapy, tailor abx (he was getting better on ceftriaxone, ? Whether we can change to PO abx to extend the treatment course)  Possible drainage of  facial abscess?  I would propose longer abx course, then repeat CT scan chest to determine whether he will need FOB. If he worsens clinically, then will proceed to FOB sooner  2. Pleural effusions -- likely parapneumonic fluid but consider empyema.  Not large enough for thora on u/s eval by IR.  PLAN -  Cont abx - see above Will recheck chest [place] on 4/11, consider possible thora at that time. I believe he needs thora if there is enough fluid to get. If the fluid is loculated then possible referral to [facility] for VATS  3. Anemia - Per primary.  Planning CSY 4/10  Lab 04/01/12 0630 03/31/12 0500 03/30/12 0530 03/29/12 1614 03/29/12 0555  HGB 8.2* 8.0* 6.9* 7.4* 5.9*   4. DM - per primary , does he need gastroparesis treatment, work up?  5. Transaminitis - per primary   Lab 04/01/12 0630 03/31/12 0500 03/30/12 0530 03/29/12 0555 03/28/12 0007  AST 46* 103* 52* 41* 107*  ALT 91* 127* 78* 69* 104*  ALKPHOS 714* 792* 494* 347* 365*  BILITOT 0.5 0.6 0.6 0.6 1.6*  PROT 6.8 6.7 6.3 6.6 7.1  ALBUMIN 2.0* 1.9* 1.9* 2.0* 2.5*  INR -- -- -- -- --   6. Hyponatremia- quickly resolved  Baltazar Apo, MD,  PhD 04/01/2012, 9:39 AM [facility] [phone] or if no answer [phone]

## 2012-04-01 NOTE — Progress Notes (Addendum)
Patient has order to do CGB Q1h x 4hours. Started at 1339. Second at 1449. Patient was off unit for colonoscopy until around 1800. Resumed at 1825, And passed along to night shift RN to obtain fourth CBG. Will continue to monitor.

## 2012-04-01 NOTE — Progress Notes (Signed)
[place] for: Vancomycin, Zosyn  Indication: rule out pneumonia   Patient Data:   Allergies: Allergies  Allergen Reactions  . Penicillins Rash    Tolerating Ceftriaxone (03/12/12)    Patient Measurements: Height: 6\' 2"  (188 cm) Weight: 190 lb 7.6 oz (86.4 kg) IBW/kg (Calculated) : 82.2   Vital Signs: Temp:  [98 F (36.7 C)] 98 F (36.7 C) (04/09 2244) Pulse Rate:  [109] 109  (04/09 2244) Resp:  [6-32] 20  (04/09 2244) BP: (145-182)/(81-105) 182/105 mmHg (04/09 2244) SpO2:  [92 %-95 %] 94 % (04/09 2244)  Intake/Output from previous day:  Intake/Output Summary (Last 24 hours) at 04/01/12 1118 Last data filed at 04/01/12 0958  Gross per 24 hour  Intake      3 ml  Output      0 ml  Net      3 ml    Labs:  Basename 04/01/12 0630 03/31/12 0500 03/30/12 0530  WBC 9.1 10.1 10.6*  HGB 8.2* 8.0* 6.9*  PLT 313 281 299  LABCREA -- -- --  CREATININE 0.91 1.10 1.15   Estimated Creatinine Clearance: 119.2 ml/min (by C-G formula based on Cr of 0.91).  Scheduled Medications:     . sodium chloride   Intravenous Once  . atorvastatin  20 mg Oral q1800  . docusate sodium  100 mg Oral BID  . furosemide  20 mg Intravenous Q12H  . insulin aspart  0-9 Units Subcutaneous TID WC  . insulin aspart  11 Units Subcutaneous TID AC  . insulin glargine  40 Units Subcutaneous QHS  . pantoprazole  40 mg Oral Daily  . piperacillin-tazobactam (ZOSYN)  IV  3.375 g Intravenous Q8H  . polyethylene glycol-electrolytes  4,000 mL Oral Once  . senna  1 tablet Oral BID  . sodium chloride  3 mL Intravenous Q12H  . vancomycin  1,000 mg Intravenous Q8H  . DISCONTD: pantoprazole  40 mg Oral BID AC  . DISCONTD: piperacillin-tazobactam (ZOSYN)  IV  3.375 g Intravenous Q8H  . DISCONTD: polyethylene glycol-electrolytes  4,000 mL Oral Once      Assessment:  46 y.o. male admitted on 03/27/2012, with recurrent pneumonia. Pharmacy consulted to manage vancomycin and  Zosyn (currently day 5).  Noted plans for thoracentesis and possible referral for VATS.      Goal of Therapy:  1. Vancomycin trough level 15-20 mcg/ml    Plan:  1. Continue Vancomycin 1g IV q8h 2. Will consider repeat vancomycin late this week or early next week 3. Follow up SCr, UOP, cultures, clinical course and adjust as clinically indicated.   Hildred Laser, Pharm D 04/01/2012 11:21 AM

## 2012-04-01 NOTE — Progress Notes (Signed)
PT Cancellation Note  Treatment cancelled today due to patient receiving procedure or test. Pt at Xray and scheduled for colonoscopy this afternoon. Will followup with patient tomorrow.   04/01/2012 Jacqualyn Posey PTA [phone] pager [phone] office     Jacqualyn Posey 04/01/2012, 11:05 AM

## 2012-04-01 NOTE — Progress Notes (Signed)
04/01/2012 Jacqualyn Posey PTA [phone] pager [phone] office

## 2012-04-02 DIAGNOSIS — L03211 Cellulitis of face: Secondary | ICD-10-CM

## 2012-04-02 DIAGNOSIS — J189 Pneumonia, unspecified organism: Secondary | ICD-10-CM

## 2012-04-02 DIAGNOSIS — L0201 Cutaneous abscess of face: Secondary | ICD-10-CM

## 2012-04-02 LAB — CBC
HCT: 23.2 % — ABNORMAL LOW (ref 39.0–52.0)
Hemoglobin: 7.6 g/dL — ABNORMAL LOW (ref 13.0–17.0)
MCH: 28.8 pg (ref 26.0–34.0)
MCHC: 32.8 g/dL (ref 30.0–36.0)
MCV: 87.9 fL (ref 78.0–100.0)
Platelets: 271 10*3/uL (ref 150–400)
RBC: 2.64 MIL/uL — ABNORMAL LOW (ref 4.22–5.81)
RDW: 15.1 % (ref 11.5–15.5)
WBC: 11 10*3/uL — ABNORMAL HIGH (ref 4.0–10.5)

## 2012-04-02 LAB — GLUCOSE, CAPILLARY
Glucose-Capillary: 189 mg/dL — ABNORMAL HIGH (ref 70–99)
Glucose-Capillary: 277 mg/dL — ABNORMAL HIGH (ref 70–99)
Glucose-Capillary: 186 mg/dL — ABNORMAL HIGH (ref 70–99)
Glucose-Capillary: 117 mg/dL — ABNORMAL HIGH (ref 70–99)
Glucose-Capillary: 166 mg/dL — ABNORMAL HIGH (ref 70–99)

## 2012-04-02 LAB — PREPARE RBC (CROSSMATCH)

## 2012-04-02 MED ORDER — FUROSEMIDE 10 MG/ML IJ SOLN
20.0000 mg | Freq: Once | INTRAMUSCULAR | Status: AC
  Administered 2012-04-02: 20 mg via INTRAVENOUS
  Filled 2012-04-02: qty 2

## 2012-04-02 MED ORDER — INSULIN ASPART 100 UNIT/ML ~~LOC~~ SOLN: 9.0000 [IU] | Freq: Three times a day (TID) | SUBCUTANEOUS | Status: DC

## 2012-04-02 MED ORDER — METOPROLOL TARTRATE 50 MG PO TABS
50.0000 mg | ORAL_TABLET | Freq: Two times a day (BID) | ORAL | Status: DC
  Administered 2012-04-02 – 2012-04-08 (×14): 50 mg via ORAL
  Filled 2012-04-02 (×16): qty 1

## 2012-04-02 MED ORDER — LIDOCAINE HCL (PF) 1 % IJ SOLN
INTRAMUSCULAR | Status: AC
  Administered 2012-04-02: 12:00:00
  Filled 2012-04-02: qty 5

## 2012-04-02 MED ORDER — FERROUS SULFATE 325 (65 FE) MG PO TABS
325.0000 mg | ORAL_TABLET | Freq: Two times a day (BID) | ORAL | Status: DC
  Administered 2012-04-02 – 2012-04-13 (×21): 325 mg via ORAL
  Filled 2012-04-02 (×28): qty 1

## 2012-04-02 MED ORDER — INSULIN ASPART 100 UNIT/ML ~~LOC~~ SOLN
13.0000 [IU] | Freq: Three times a day (TID) | SUBCUTANEOUS | Status: DC
  Administered 2012-04-02 – 2012-04-04 (×5): 13 [IU] via SUBCUTANEOUS

## 2012-04-02 MED ORDER — AMLODIPINE BESYLATE 5 MG PO TABS
5.0000 mg | ORAL_TABLET | Freq: Every day | ORAL | Status: DC
  Administered 2012-04-02 – 2012-04-12 (×10): 5 mg via ORAL
  Filled 2012-04-02 (×12): qty 1

## 2012-04-02 NOTE — Consult Note (Signed)
[name] [name] is an 46 y.o. male.  CC: Cheek swelling Right     Past Medical History  Diagnosis Date  . Diabetes mellitus   . Pneumonia 02/2012    Strep pneumoniae bilateral pneumonia complicated by bacteremia  . Bacteremia     Past Surgical History  Procedure Date  . Esophagogastroduodenoscopy 03/31/2012    Procedure: ESOPHAGOGASTRODUODENOSCOPY (EGD);  Surgeon: Beryle Beams, MD;  Location: [facility] ENDOSCOPY;  Service: Endoscopy;  Laterality: N/A;    History reviewed. No pertinent family history.  Social History:  reports that he has never smoked. He has never used smokeless tobacco. He reports that he does not drink alcohol or use illicit drugs.  Allergies:  Allergies  Allergen Reactions  . Penicillins Rash    Tolerating Ceftriaxone (03/12/12)  I have reviewed the patient's current medications.  Medications:   Results for orders placed during the hospital encounter of 03/27/12 (from the past 48 hour(s))  GLUCOSE, CAPILLARY     Status: Normal   Collection Time   03/31/12  4:56 PM      Component Value Range Comment   Glucose-Capillary 75  70 - 99 (mg/dL)   GLUCOSE, CAPILLARY     Status: Abnormal   Collection Time   03/31/12 10:40 PM      Component Value Range Comment   Glucose-Capillary 275 (*) 70 - 99 (mg/dL)   CBC     Status: Abnormal   Collection Time   04/01/12  6:30 AM      Component Value Range Comment   WBC 9.1  4.0 - 10.5 (K/uL)    RBC 2.86 (*) 4.22 - 5.81 (MIL/uL)    Hemoglobin 8.2 (*) 13.0 - 17.0 (g/dL)    HCT 25.0 (*) 39.0 - 52.0 (%)    MCV 87.4  78.0 - 100.0 (fL)    MCH 28.7  26.0 - 34.0 (pg)    MCHC 32.8  30.0 - 36.0 (g/dL)    RDW 15.4  11.5 - 15.5 (%)    Platelets 313  150 - 400 (K/uL)   COMPREHENSIVE METABOLIC PANEL     Status: Abnormal   Collection Time   04/01/12  6:30 AM      Component Value Range Comment   Sodium 136  135 - 145 (mEq/L)    Potassium 3.5  3.5 - 5.1 (mEq/L)    Chloride 98  96 - 112 (mEq/L)    CO2 30  19 - 32 (mEq/L)    Glucose, Bld 99  70 - 99 (mg/dL)    BUN 18  6 - 23 (mg/dL) DELTA CHECK NOTED   Creatinine, Ser 0.91  0.50 - 1.35 (mg/dL)    Calcium 8.7  8.4 - 10.5 (mg/dL)    Total Protein 6.8  6.0 - 8.3 (g/dL)    Albumin 2.0 (*) 3.5 - 5.2 (g/dL)    AST 46 (*) 0 - 37 (U/L)    ALT 91 (*) 0 - 53 (U/L)    Alkaline Phosphatase 714 (*) 39 - 117 (U/L)    Total Bilirubin 0.5  0.3 - 1.2 (mg/dL)    GFR calc non Af Amer >90  >90 (mL/min)    GFR calc Af Amer >90  >90 (mL/min)   GLUCOSE, CAPILLARY     Status: Abnormal   Collection Time   04/01/12  7:04 AM      Component Value Range Comment   Glucose-Capillary 153 (*) 70 - 99 (mg/dL)   GLUCOSE, CAPILLARY  Status: Normal   Collection Time   04/01/12 11:29 AM      Component Value Range Comment   Glucose-Capillary 88  70 - 99 (mg/dL)   GLUCOSE, CAPILLARY     Status: Abnormal   Collection Time   04/01/12  1:39 PM      Component Value Range Comment   Glucose-Capillary 68 (*) 70 - 99 (mg/dL)   GLUCOSE, CAPILLARY     Status: Normal   Collection Time   04/01/12  2:49 PM      Component Value Range Comment   Glucose-Capillary 86  70 - 99 (mg/dL)   GLUCOSE, CAPILLARY     Status: Abnormal   Collection Time   04/01/12  6:25 PM      Component Value Range Comment   Glucose-Capillary 128 (*) 70 - 99 (mg/dL)   GLUCOSE, CAPILLARY     Status: Abnormal   Collection Time   04/01/12  9:39 PM      Component Value Range Comment   Glucose-Capillary 189 (*) 70 - 99 (mg/dL)   CBC     Status: Abnormal   Collection Time   04/02/12  6:05 AM      Component Value Range Comment   WBC 11.0 (*) 4.0 - 10.5 (K/uL)    RBC 2.64 (*) 4.22 - 5.81 (MIL/uL)    Hemoglobin 7.6 (*) 13.0 - 17.0 (g/dL)    HCT 23.2 (*) 39.0 - 52.0 (%)    MCV 87.9  78.0 - 100.0 (fL)    MCH 28.8  26.0 - 34.0 (pg)    MCHC 32.8  30.0 - 36.0 (g/dL)    RDW 15.1  11.5 - 15.5 (%)    Platelets 271  150 - 400 (K/uL)   GLUCOSE, CAPILLARY     Status: Abnormal   Collection Time   04/02/12  6:37 AM      Component Value  Range Comment   Glucose-Capillary 277 (*) 70 - 99 (mg/dL)   GLUCOSE, CAPILLARY     Status: Abnormal   Collection Time   04/02/12 11:17 AM      Component Value Range Comment   Glucose-Capillary 186 (*) 70 - 99 (mg/dL)     Dg Orthopantogram  04/01/2012  *RADIOLOGY REPORT*  Clinical Data: 46 year old male with poor dentition, swelling of the right upper jaw and tooth ache.  ORTHOPANTOGRAM/PANORAMIC  Comparison: Face CT 03/31/2012.  Findings: Large dental caries of the left mandibular wisdom tooth again noted.  Periapical lucency at the left maxillary anterior molar better seen on comparison.  No right side maxillary or mandible dentition peri apical lucency.  Small dental caries better seen on comparison (Non occlusal surface posterior right maxillary molar).  Mandible intact.  IMPRESSION: Left greater than right dental caries.  No periapical lucency identified in the area of the right buccal space collection.  Original Report Authenticated By: Randall An, M.D.   Ct Maxillofacial W/cm  04/01/2012  *RADIOLOGY REPORT*  Clinical Data: Swelling on the right for several weeks.  Jaw pain.  CT MAXILLOFACIAL WITH CONTRAST  Technique:  Multidetector CT imaging of the maxillofacial structures was performed with intravenous contrast. Multiplanar CT image reconstructions were also generated.  Contrast: 56mL OMNIPAQUE IOHEXOL 300 MG/ML  SOLN  Comparison: None.  Findings: In the right buccal region, there is a complex fluid collection spanning over 5 x 2.1 x 1.1 cm.  This is immediately adjacent to the significant dental disease.  Mild cervical spondylotic changes.  Visualized mastoid air cells, middle ear  cavities and paranasal sinuses are clear.  IMPRESSION: Probable abscess right buccal region most likely dental in origin given the degree of significant caries and periapical lucency of the molars.  Results called to Bari Mantis patient's nurse 04/01/2012 3:20 a.m. with request to call report first in the morning.   Original Report Authenticated By: Doug Sou, M.D.     Blood pressure 152/91, pulse 98, temperature 99.3 F (37.4 C), temperature source Oral, resp. rate 24, height 6\' 2"  (1.88 m), weight 86.7 kg (191 lb 2.2 oz), SpO2 93.00%. Throat: 5cm by 2 cm by 1 cm swelling right buccal cheek  Assessment/Plan: Panorex reviewed. Location of swelling may indicate parotid origin. Drained bedside. Local anesthesia 2% lidocaine 1cc. 18 gauge needle aspiration. Approximately 3-4cc purulent material expressed. Abscess decompressed. Patient tolerated well. Aerobic culture ordered.  Nichol Ator M 04/02/2012, 12:20 PM

## 2012-04-02 NOTE — Progress Notes (Signed)
[name] [name] CSN:[id number],MRN:[id number] is a 46 y.o. male,  Outpatient Primary MD for the patient is [name], MD, MD  Chief Complaint  Patient presents with  . Emesis        Subjective:   [name] [name] today has, No headache, No chest pain, No abdominal pain - No Nausea, No new weakness tingling or numbness, No Cough - SOB.   Objective:   Filed Vitals:   04/02/12 0400 04/02/12 0544 04/02/12 0549 04/02/12 0844  BP: 145/87 140/80  152/91  Pulse:  96  98  Temp:  98.6 F (37 C)  99.3 F (37.4 C)  TempSrc:  Oral  Oral  Resp: 16 20  24   Height:      Weight:   86.7 kg (191 lb 2.2 oz)   SpO2:  98%  93%    Wt Readings from Last 3 Encounters:  04/02/12 86.7 kg (191 lb 2.2 oz)  04/02/12 86.7 kg (191 lb 2.2 oz)  04/02/12 86.7 kg (191 lb 2.2 oz)     Intake/Output Summary (Last 24 hours) at 04/02/12 1244 Last data filed at 04/02/12 0951  Gross per 24 hour  Intake   1478 ml  Output      0 ml  Net   1478 ml    Exam Awake Alert, Oriented *3, No new F.N deficits, Normal affect [place].AT,PERRAL. Right-sided facial swelling. Supple Neck,No JVD, No cervical lymphadenopathy appriciated.  Symmetrical Chest wall movement, Good air movement bilaterally, CTAB RRR,No Gallops,Rubs or new Murmurs, No Parasternal Heave +ve B.Sounds, Abd Soft, Non tender, No organomegaly appriciated, No rebound -guarding or rigidity. No Cyanosis, Clubbing , 1+edema, No new Rash or bruise    Data Review  CBC  Lab 04/02/12 0605 04/01/12 0630 03/31/12 0500 03/30/12 0530 03/29/12 1614 03/29/12 0555  WBC 11.0* 9.1 10.1 10.6* -- 13.7*  HGB 7.6* 8.2* 8.0* 6.9* 7.4* --  HCT 23.2* 25.0* 23.8* 20.8* 22.2* --  PLT 271 313 281 299 -- 327  MCV 87.9 87.4 85.6 88.9 -- 92.1  MCH 28.8 28.7 28.8 29.5 -- 29.2  MCHC 32.8 32.8 33.6 33.2 -- 31.7  RDW 15.1 15.4 15.7* 15.1 -- 14.9  LYMPHSABS -- -- -- -- -- --  MONOABS -- -- -- -- -- --  EOSABS -- -- -- -- -- --  BASOSABS -- -- -- -- -- --  BANDABS -- --  -- -- -- --    Chemistries   Lab 04/01/12 0630 03/31/12 0500 03/30/12 0530 03/29/12 0555 03/28/12 0007  NA 136 130* 130* 130* 133*  K 3.5 4.2 4.2 4.3 4.4  CL 98 95* 97 96 98  CO2 30 25 25 25 25   GLUCOSE 99 250* 190* 334* 160*  BUN 18 31* 36* 41* 40*  CREATININE 0.91 1.10 1.15 1.25 1.13  CALCIUM 8.7 8.7 8.3* 8.2* 8.5  MG -- 1.9 -- -- --  AST 46* 103* 52* 41* 107*  ALT 91* 127* 78* 69* 104*  ALKPHOS 714* 792* 494* 347* 365*  BILITOT 0.5 0.6 0.6 0.6 1.6*   ------------------------------------------------------------------------------------------------------------------ estimated creatinine clearance is 119.2 ml/min (by C-G formula based on Cr of 0.91). ------------------------------------------------------------------------------------------------------------------ No results found for this basename: HGBA1C:2 in the last 72 hours ------------------------------------------------------------------------------------------------------------------ No results found for this basename: CHOL:2,HDL:2,LDLCALC:2,TRIG:2,CHOLHDL:2,LDLDIRECT:2 in the last 72 hours ------------------------------------------------------------------------------------------------------------------ No results found for this basename: TSH,T4TOTAL,FREET3,T3FREE,THYROIDAB in the last 72 hours ------------------------------------------------------------------------------------------------------------------ No results found for this basename: VITAMINB12:2,FOLATE:2,FERRITIN:2,TIBC:2,IRON:2,RETICCTPCT:2 in the last 72 hours  Coagulation profile No results found for this basename: INR:5,PROTIME:5 in the last  168 hours  No results found for this basename: DDIMER:2 in the last 72 hours  Cardiac Enzymes No results found for this basename: CK:3,CKMB:3,TROPONINI:3,MYOGLOBIN:3 in the last 168 hours ------------------------------------------------------------------------------------------------------------------ No components found  with this basename: POCBNP:3  Micro Results Recent Results (from the past 240 hour(s))  CULTURE, BLOOD (ROUTINE X 2)     Status: Normal (Preliminary result)   Collection Time   03/28/12 12:15 AM      Component Value Range Status Comment   Specimen Description BLOOD LEFT ARM   Final    Special Requests BOTTLES DRAWN AEROBIC AND ANAEROBIC 10CC   Final    Culture  Setup Time [id number]   Final    Culture     Final    Value:        BLOOD CULTURE RECEIVED NO GROWTH TO DATE CULTURE WILL BE HELD FOR 5 DAYS BEFORE ISSUING A FINAL NEGATIVE REPORT   Report Status PENDING   Incomplete   CULTURE, BLOOD (ROUTINE X 2)     Status: Normal (Preliminary result)   Collection Time   03/28/12 12:25 AM      Component Value Range Status Comment   Specimen Description BLOOD RIGHT ARM   Final    Special Requests BOTTLES DRAWN AEROBIC ONLY 10CC   Final    Culture  Setup Time [id number]   Final    Culture     Final    Value:        BLOOD CULTURE RECEIVED NO GROWTH TO DATE CULTURE WILL BE HELD FOR 5 DAYS BEFORE ISSUING A FINAL NEGATIVE REPORT   Report Status PENDING   Incomplete     Radiology Reports Dg Chest 2 View  03/28/2012  *RADIOLOGY REPORT*  Clinical Data: Cough, right rib pain, emesis, recent pneumonia  CHEST - 2 VIEW  Comparison: 03/16/2012  Findings: Patchy right lower lobe opacity, suspicious for pneumonia, with associated small-to-moderate right pleural effusion.  Left basilar opacity, atelectasis versus pneumonia, with suspected small left pleural effusion.  No pneumothorax.  The heart is normal in size.  Interval removal of right subclavian PICC.  Visualized osseous structures are within normal limits.  IMPRESSION: Patchy right lower lobe opacity, suspicious for pneumonia, with associated small-to-moderate right pleural effusion.  Left basilar opacity, atelectasis versus pneumonia, with suspected small left pleural effusion.  Original Report Authenticated By: Julian Hy, M.D.   Dg Chest 2  View  03/16/2012  *RADIOLOGY REPORT*  Clinical Data: History of pneumonia.  CHEST - 2 VIEW  Comparison: 03/11/2012.  Findings: Tip of right PICC terminates in right atrial region.  No pneumothorax is seen.  There is some atelectasis and infiltrate in the medial posterior aspect of the left lung.  There are atelectasis and infiltrative densities seen within the right perihilar region and within the right midlung and right lower lung areas with consolidation and atelectasis.  There is improvement in the amount of infiltrate compared to previous study.   On the lateral image there is continued abnormal opacity inferiorly and posteriorly.  Indistinctness of costophrenic angles may reflect an element of pleural effusion.  IMPRESSION: Right PICC in place.  Some atelectasis and infiltrative density in the medial posterior left lung inferiorly. here are atelectasis and infiltrative densities seen within the right perihilar region and within the right midlung and right lower lung areas with consolidation and atelectasis.  There is improvement in the amount of infiltrative compared to previous study but clearing has not occurred. Question small amounts of pleural effusion.  Original  Report Authenticated By: Delane Ginger, M.D.   Dg Chest 2 View  03/11/2012  *RADIOLOGY REPORT*  Clinical Data: Shortness of breath with right lower chest pain and fever.  CHEST - 2 VIEW  Comparison: None.  Findings: Trachea is midline.  Heart size normal.  There is dense airspace consolidation in the right lower lobe.  Question mild left lower lobe air space disease.  No left pleural fluid.  IMPRESSION:  1.  Dense airspace consolidation in the right lower lobe is most consistent with pneumonia.  Follow-up to clearing is recommended, to exclude a centrally obstructing mass. 2.  Question mild left lower lobe air space disease.  Original Report Authenticated By: Luretha Rued, M.D.   Ct Chest Wo Contrast  03/28/2012  *RADIOLOGY REPORT*   Clinical Data: Cough, right-sided chest pain  CT CHEST WITHOUT CONTRAST  Technique:  Multidetector CT imaging of the chest was performed following the standard protocol without IV contrast.  Comparison: Chest radiographs dated 03/28/2012.  CT chest dated 03/11/2012.  Findings: Patchy posterior right upper lobe opacity and near complete right lower lobe consolidation, suspicious for pneumonia. Associated moderate right pleural effusion.  Patchy left lower lobe opacity, atelectasis versus pneumonia, with associated small left pleural effusion.  No pneumothorax.  Heart is normal in size.  Small pericardial effusion.  Visualized upper abdomen is unremarkable.  Mild degenerative changes of the visualized thoracolumbar spine.  IMPRESSION: Right upper/lower lobe pneumonia, with associated moderate right pleural effusion.  Patchy left lower lobe opacity, atelectasis versus pneumonia, with associated small left pleural effusion.  Original Report Authenticated By: Julian Hy, M.D.   Ct Angio Chest W/cm &/or Wo Cm  03/11/2012  *RADIOLOGY REPORT*  Clinical Data: Fever.  Cough.  Weakness.  Short of breath.  CT ANGIOGRAPHY CHEST  Technique:  Multidetector CT imaging of the chest using the standard protocol during bolus administration of intravenous contrast. Multiplanar reconstructed images including MIPs were obtained and reviewed to evaluate the vascular anatomy.  Contrast: 65mL OMNIPAQUE IOHEXOL 350 MG/ML IV SOLN  Comparison: Chest radiography same day  Findings: Pulmonary arterial opacification is excellent.  There are no pulmonary emboli.  There is consolidative pneumonia throughout the right lower lobe with minor involvement of the right upper lobe.  There is consolidative pneumonia less extensively in the posterior aspect of the left lower lobe.  There is a small amount of pleural fluid on the right.  There is a tiny amount of pericardial fluid.  There are small mediastinal lymph nodes, likely reactive.  No upper  abdominal pathology is seen.  IMPRESSION: No pulmonary emboli.  Consolidative pneumonia throughout the right lower lobe.  Partial involvement of the left lower lobe by consolidative  pneumonia. Minimal involvement of the right upper lobe.  Original Report Authenticated By: Jules Schick, M.D.   Nm Hepatobiliary  03/14/2012  *RADIOLOGY REPORT*  Clinical Data: Abdominal pain, evaluate for cholecystitis  NUCLEAR MEDICINE HEPATOHBILIARY INCLUDE GB  Radiopharmaceutical:  5.5 mCi technetium 39m Choletec  Comparison: Ultrasound dated 03/12/2012.  Findings: Normal hepatic uptake.  Small bowel is visible within 20 minutes.  Gallbladder is visible within 30 minutes, confirming cystic duct patency.  IMPRESSION: Normal hepatobiliary nuclear medicine scan.  Original Report Authenticated By: Julian Hy, M.D.   [place] Chest  03/28/2012  *RADIOLOGY REPORT*  Clinical Data: 46 year old male with pneumonia and pleural effusions on chest CT.  Assess for thoracentesis.  CHEST ULTRASOUND  Comparison: Chest CT without contrast 03/28/2012 and earlier.  Findings: Gray-scale imaging of the right hemithorax  demonstrates a small component of pleural fluid, with a larger adjacent component of consolidated lung.  No free fluid seen subjacent to the right hemidiaphragm.  The quantity of pleural fluid identified is insufficient for thoracentesis.  IMPRESSION: Right pleural effusion and larger component of right lung consolidation on ultrasound.  Pleural fluid component quantity insufficient for thoracentesis at this time.  Original Report Authenticated By: Randall An, M.D.   US Abdomen Complete  03/28/2012  *RADIOLOGY REPORT*  Clinical Data:  Elevated LFTs.  Right upper quadrant pain and nausea.  ABDOMINAL ULTRASOUND COMPLETE  Comparison:  03/12/2012  Findings:  Gallbladder:  No gallstones, gallbladder wall thickening, or pericholecystic fluid. Negative sonographic Murphy's sign reported by the sonographer.  Common Bile Duct:  Within  normal limits in caliber.  Liver: No focal mass lesion identified.  Within normal limits in parenchymal echogenicity.  IVC:  Appears normal.  Pancreas:  No abnormality identified.  Spleen:  Within normal limits in size and echotexture.  Right kidney:  Normal in size and parenchymal echogenicity.  No evidence of mass or hydronephrosis.  Left kidney:  Normal in size and parenchymal echogenicity.  No evidence of mass or hydronephrosis.  Abdominal Aorta:  No aneurysm identified.  Other findings:  Small bilateral pleural effusions.  Small amount of upper abdominal ascites posterior to the liver and spleen.  IMPRESSION:  1.  Small amount of abdominal ascites. 2.  Small pleural effusions.  Original Report Authenticated By: Angelita Ingles, M.D.   US Abdomen Complete  03/12/2012  *RADIOLOGY REPORT*  Clinical Data:  Right upper quadrant abdominal pain, elevated liver function tests  ABDOMINAL ULTRASOUND COMPLETE  Comparison:  Chest CT 03/11/2012 with incomplete visualization of the liver  Findings:  Gallbladder:  Minimal dependent sludge is noted within the gallbladder.  The gallbladder is not distended, with gallbladder wall thickness measuring borderline thickened at 4 mm.  No shadowing calculus or sonographic Murphy's sign noted.  Common Bile Duct:  Within normal limits in caliber.  Liver: No focal mass lesion identified.  Within normal limits in parenchymal echogenicity.  IVC:  Appears normal.  Pancreas:  No abnormality identified.  Spleen:  Within normal limits in size and echotexture.  Right kidney:  Normal in size and parenchymal echogenicity.  No evidence of mass or hydronephrosis.  Left kidney:  Normal in size and parenchymal echogenicity.  No evidence of mass or hydronephrosis.  Abdominal Aorta:  Small amount of ascites is noted.  Trace left pleural effusion.  IMPRESSION: Minimal sludge within the gallbladder with borderline wall thickening.  This could indicate cholecystitis in the appropriate clinical  context.  Trace ascites.  Original Report Authenticated By: Arline Asp, M.D.   Ct Maxillofacial W/cm  04/01/2012  *RADIOLOGY REPORT*  Clinical Data: Swelling on the right for several weeks.  Jaw pain.  CT MAXILLOFACIAL WITH CONTRAST  Technique:  Multidetector CT imaging of the maxillofacial structures was performed with intravenous contrast. Multiplanar CT image reconstructions were also generated.  Contrast: 13mL OMNIPAQUE IOHEXOL 300 MG/ML  SOLN  Comparison: None.  Findings: In the right buccal region, there is a complex fluid collection spanning over 5 x 2.1 x 1.1 cm.  This is immediately adjacent to the significant dental disease.  Mild cervical spondylotic changes.  Visualized mastoid air cells, middle ear cavities and paranasal sinuses are clear.  IMPRESSION: Probable abscess right buccal region most likely dental in origin given the degree of significant caries and periapical lucency of the molars.  Results called to Bari Mantis  patient's nurse 04/01/2012 3:20 a.m. with request to call report first in the morning.  Original Report Authenticated By: Doug Sou, M.D.   Dg Chest Port 1 View  03/31/2012  *RADIOLOGY REPORT*  Clinical Data: Assess infiltrate.  PORTABLE CHEST - 1 VIEW  Comparison: 03/28/2012  Findings: Very low lung volumes.  Worsening bibasilar opacities and probable edema.  Bilateral effusions.  Cardiomegaly.  IMPRESSION: Increasing pulmonary edema, bibasilar opacities and effusions. Very low lung volumes.  Original Report Authenticated By: Raelyn Number, M.D.    Scheduled Meds:    . amLODipine  5 mg Oral Daily  . atorvastatin  20 mg Oral q1800  . dextrose      . docusate sodium  100 mg Oral BID  . ferrous sulfate  325 mg Oral BID WC  . furosemide  20 mg Intravenous Once  . furosemide  40 mg Oral Daily  . insulin aspart  0-9 Units Subcutaneous TID WC  . insulin aspart  13 Units Subcutaneous TID AC  . insulin glargine  40 Units Subcutaneous QHS  . lidocaine      .  metoprolol tartrate  50 mg Oral BID  . piperacillin-tazobactam (ZOSYN)  IV  3.375 g Intravenous Q8H  . senna  1 tablet Oral BID  . sodium chloride  3 mL Intravenous Q12H  . vancomycin  1,000 mg Intravenous Q8H  . DISCONTD: sodium chloride   Intravenous Once  . DISCONTD: insulin aspart  11 Units Subcutaneous TID AC  . DISCONTD: insulin aspart  9 Units Subcutaneous TID AC  . DISCONTD: pantoprazole  40 mg Oral Daily   Continuous Infusions:  PRN Meds:.sodium chloride, acetaminophen, acetaminophen, morphine, ondansetron (ZOFRAN) IV, ondansetron, oxyCODONE, sodium chloride, DISCONTD: fentaNYL, DISCONTD: midazolam  Assessment & Plan   Brief narrative:  46 year old man recently treated for streptococcus pneumoniae bacteremia and pneumonia. Presented with cough, right-sided chest pain.    Chart review:  03/11/2012-03/16/2012 hospitalization: Streptococcus pneumoniae bacteremia, pneumonia, acute renal failure, elevated LFTs.   Past medical history:  Diabetes mellitus    Consultations:  Pulmonology Gastroenterology ENT  Physical therapy: Home health   Procedures:  April 9: Upper endoscopy: LA grade C esophagitis. Protonix 40 mg by mouth twice a day- Dr Benson [place] April 10: Colonoscopy - Dr Collene Mares April 11: I&D of Rt Parotid Abscess - Dr Hoyt Koch   Antibiotics:  April 6: Vancomycin  April 6: Zosyn      #1. 46 y.o. male admitted on 03/27/2012, with recurrent pneumonia along with pleural effusion concerning for empyema, recent admission for Streptococcus pneumoniae bacteremia and was discharged on IV Rocephin, now comes back with fever chills. Pharmacy consulted to manage vancomycin and Zosyn started on April 6, Noted plans for thoracentesis and possible referral for [facility] pulmonary managing to see the patient again. Based on repeat chest ultrasound results. Appreciate pulmonary input, have discussed the case with Dr. Tommy Medal infectious disease hose to see the patient today and adjust  antibiotics further. He is HIV negative.     #2.Mixed pattern elevated LFTs: Etiology not clear. Abdominal ultrasound unremarkable, hepatitis panel negative and recent HIDA scan was negative. Possibly related to effusion. Continue to follow.     #3. Hyponatremia: Stable. Likely related to current pneumonia/pulmonary process.     #4. Anemia: No history of bleeding. Fecal occult blood negative. No previous data prior to last hospitalization when he presented with hemoglobin of 10 which quickly decreased, probably from dilution also could be a medication effect. His anemia panel does show severely  low iron although ferritin is stable (however it is a acute phase reactant) . Appreciate gastroenterology evaluation, EGD showing grade C. esophagitis placed on PPI, limited colonoscopy also unremarkable, as patient is getting symptomatic i.e. short of breath with exertion we'll transfuse him one unit of packed RBC on 04/02/2012.    #5. Diabetes mellitus type 2, uncontrolled: Continue sliding scale insulin, Lantus, meal coverage increased.  CBG (last 3)   Basename 04/02/12 1117 04/02/12 0637 04/01/12 2139  GLUCAP 186* 277* 189*      #6. Right buccal mucosa mass: This developed during his last hospitalization. Was seen by ENT and oral surgery, underwent I&D of a right parotid abscess by Dr. Hoyt Koch on 04/02/2012, antibiotics as #1 above.  .   #7. Acute renal failure with edema - renal failure almost resolved,  edema improving with Lasix, will switch to by mouth Lasix, will add TED stockings and SCDs.    DVT Prophylaxis  SCDs      Thurnell Lose M.D on 04/02/2012 at 12:44 PM  [facility] Office  [phone]

## 2012-04-02 NOTE — Consult Note (Signed)
Date of Admission:  03/27/2012  Date of Consult:  04/02/2012  Reason for Consult:buccal abscess, HCAP after rx for Pneumooccal bacteremia and pneumonia Referring Physician: Dr. [name]   HPI: [name] [name] is an 46 y.o. male with diabetes recently admitted to the hospital with streptococcus pneumonia a pneumonia and bacteremia was seen by my partner Dr.Cpmer, who prescribed him high dose IV Rocephin. Patient was seen in followup by my partner Dr. Linus Salmons to discontinue the patient's PICC line. In interim the patient had developed some facial swelling while he was actually in the hospital the first time and this was noted by my partner Dr. Ewell Poe while in clinic. He was concerned that the patient might have a cyst that might require drainage in this area. The patient was then readmitted to the hospital on April 6th with fevers chills nausea vomiting and right-sided chest pain. CT scan of the chest revealed new lobar consolidation pleural effusions. He also had worsening facial swelling. He was started on vancomycin and Zosyn by the hospitalist team. The patient was seen by Dr. Lamonte Sakai with critical care medicine, as well by Dr. Almyra Free with a GI who diagnosed gastritis. CT of the maxillofacial area did revealed a abscess within the buccal mucosa. This was thought by radiology and ear nose and throat to be due to dental disease that the patient has significant caries in this region. Patient is also been seen by Dr. Hoyt Koch who today at the bedside drained pus from this region. Dr. Hoyt Koch actually feels the tinnitus for this abscess is more likely the patient's parotid gland. Consult to assist in the manner work up this patient with buccal abscess, possible HCAP after recent rx for Pneumococcal pneumonia. I spent greater than 45 minutes with the patient including greater than 50% of time in face to face counsel of the patient and in coordination of their care.   Past Medical History  Diagnosis Date  . Diabetes  mellitus   . Pneumonia 02/2012    Strep pneumoniae bilateral pneumonia complicated by bacteremia  . Bacteremia     Past Surgical History  Procedure Date  . Esophagogastroduodenoscopy 03/31/2012    Procedure: ESOPHAGOGASTRODUODENOSCOPY (EGD);  Surgeon: Beryle Beams, MD;  Location: [facility];  Service: Endoscopy;  Laterality: N/A;  ergies:   Allergies  Allergen Reactions  . Penicillins Rash    Tolerating Ceftriaxone (03/12/12)     Medications: I have reviewed patients current medications as documented in Epic Anti-infectives     Start     Dose/Rate Route Frequency Ordered Stop   03/31/12 2000  piperacillin-tazobactam (ZOSYN) IVPB 3.375 g       3.375 g 12.5 mL/hr over 240 Minutes Intravenous Every 8 hours 03/31/12 1841     03/28/12 1000   vancomycin (VANCOCIN) IVPB 1000 mg/200 mL premix        1,000 mg 200 mL/hr over 60 Minutes Intravenous Every 8 hours 03/28/12 0715     03/28/12 1000   piperacillin-tazobactam (ZOSYN) IVPB 3.375 g  Status:  Discontinued        3.375 g 12.5 mL/hr over 240 Minutes Intravenous 3 times per day 03/28/12 0726 03/31/12 1841   03/28/12 0200   vancomycin (VANCOCIN) IVPB 1000 mg/200 mL premix        1,000 mg 200 mL/hr over 60 Minutes Intravenous  Once 03/28/12 0148 03/28/12 0413   03/28/12 0200  piperacillin-tazobactam (ZOSYN) IVPB 3.375 g       3.375 g 12.5 mL/hr over 240 Minutes Intravenous  Once 03/28/12 0148 03/28/12 [id number]          Social History:  reports that he has never smoked. He has never used smokeless tobacco. He reports that he does not drink alcohol or use illicit drugs.  History reviewed. No pertinent family history.  As in HPI and primary teams notes otherwise 12 point review of systems is negative  Blood pressure 152/91, pulse 98, temperature 99.3 F (37.4 C), temperature source Oral, resp. rate 24, height 6\' 2"  (1.88 m), weight 191 lb 2.2 oz (86.7 kg), SpO2 93.00%. General: Alert and awake, oriented x3, not in any acute  distress. HEENT: anicteric sclera, pupils reactive to light and accommodation, EOMI, oropharynx clear and without exudate CVS regular rate, normal r,  no murmur rubs or gallops Chest: clear to auscultation bilaterally, no wheezing, rales or rhonchi Abdomen: soft nontender, nondistended, normal bowel sounds, Extremities: no  clubbing or edema noted bilaterally Skin: no rashes Neuro: nonfocal, strength and sensation intact   Results for orders placed during the hospital encounter of 03/27/12 (from the past 48 hour(s))  GLUCOSE, CAPILLARY     Status: Normal   Collection Time   03/31/12  4:56 PM      Component Value Range Comment   Glucose-Capillary 75  70 - 99 (mg/dL)   GLUCOSE, CAPILLARY     Status: Abnormal   Collection Time   03/31/12 10:40 PM      Component Value Range Comment   Glucose-Capillary 275 (*) 70 - 99 (mg/dL)   CBC     Status: Abnormal   Collection Time   04/01/12  6:30 AM      Component Value Range Comment   WBC 9.1  4.0 - 10.5 (K/uL)    RBC 2.86 (*) 4.22 - 5.81 (MIL/uL)    Hemoglobin 8.2 (*) 13.0 - 17.0 (g/dL)    HCT 25.0 (*) 39.0 - 52.0 (%)    MCV 87.4  78.0 - 100.0 (fL)    MCH 28.7  26.0 - 34.0 (pg)    MCHC 32.8  30.0 - 36.0 (g/dL)    RDW 15.4  11.5 - 15.5 (%)    Platelets 313  150 - 400 (K/uL)   COMPREHENSIVE METABOLIC PANEL     Status: Abnormal   Collection Time   04/01/12  6:30 AM      Component Value Range Comment   Sodium 136  135 - 145 (mEq/L)    Potassium 3.5  3.5 - 5.1 (mEq/L)    Chloride 98  96 - 112 (mEq/L)    CO2 30  19 - 32 (mEq/L)    Glucose, Bld 99  70 - 99 (mg/dL)    BUN 18  6 - 23 (mg/dL) DELTA CHECK NOTED   Creatinine, Ser 0.91  0.50 - 1.35 (mg/dL)    Calcium 8.7  8.4 - 10.5 (mg/dL)    Total Protein 6.8  6.0 - 8.3 (g/dL)    Albumin 2.0 (*) 3.5 - 5.2 (g/dL)    AST 46 (*) 0 - 37 (U/L)    ALT 91 (*) 0 - 53 (U/L)    Alkaline Phosphatase 714 (*) 39 - 117 (U/L)    Total Bilirubin 0.5  0.3 - 1.2 (mg/dL)    GFR calc non Af Amer >90  >90 (mL/min)     GFR calc Af Amer >90  >90 (mL/min)   GLUCOSE, CAPILLARY     Status: Abnormal   Collection Time   04/01/12  7:04 AM  Component Value Range Comment   Glucose-Capillary 153 (*) 70 - 99 (mg/dL)   GLUCOSE, CAPILLARY     Status: Normal   Collection Time   04/01/12 11:29 AM      Component Value Range Comment   Glucose-Capillary 88  70 - 99 (mg/dL)   GLUCOSE, CAPILLARY     Status: Abnormal   Collection Time   04/01/12  1:39 PM      Component Value Range Comment   Glucose-Capillary 68 (*) 70 - 99 (mg/dL)   GLUCOSE, CAPILLARY     Status: Normal   Collection Time   04/01/12  2:49 PM      Component Value Range Comment   Glucose-Capillary 86  70 - 99 (mg/dL)   GLUCOSE, CAPILLARY     Status: Abnormal   Collection Time   04/01/12  6:25 PM      Component Value Range Comment   Glucose-Capillary 128 (*) 70 - 99 (mg/dL)   GLUCOSE, CAPILLARY     Status: Abnormal   Collection Time   04/01/12  9:39 PM      Component Value Range Comment   Glucose-Capillary 189 (*) 70 - 99 (mg/dL)   CBC     Status: Abnormal   Collection Time   04/02/12  6:05 AM      Component Value Range Comment   WBC 11.0 (*) 4.0 - 10.5 (K/uL)    RBC 2.64 (*) 4.22 - 5.81 (MIL/uL)    Hemoglobin 7.6 (*) 13.0 - 17.0 (g/dL)    HCT 23.2 (*) 39.0 - 52.0 (%)    MCV 87.9  78.0 - 100.0 (fL)    MCH 28.8  26.0 - 34.0 (pg)    MCHC 32.8  30.0 - 36.0 (g/dL)    RDW 15.1  11.5 - 15.5 (%)    Platelets 271  150 - 400 (K/uL)   GLUCOSE, CAPILLARY     Status: Abnormal   Collection Time   04/02/12  6:37 AM      Component Value Range Comment   Glucose-Capillary 277 (*) 70 - 99 (mg/dL)   GLUCOSE, CAPILLARY     Status: Abnormal   Collection Time   04/02/12 11:17 AM      Component Value Range Comment   Glucose-Capillary 186 (*) 70 - 99 (mg/dL)       Component Value Date/Time   SDES BLOOD RIGHT ARM 03/28/2012 0025   SPECREQUEST BOTTLES DRAWN AEROBIC ONLY 10CC 03/28/2012 0025   CULT        BLOOD CULTURE RECEIVED NO GROWTH TO DATE CULTURE WILL BE  HELD FOR 5 DAYS BEFORE ISSUING A FINAL NEGATIVE REPORT 03/28/2012 0025   REPTSTATUS PENDING 03/28/2012 0025   Dg Orthopantogram  04/01/2012  *RADIOLOGY REPORT*  Clinical Data: 46 year old male with poor dentition, swelling of the right upper jaw and tooth ache.  ORTHOPANTOGRAM/PANORAMIC  Comparison: Face CT 03/31/2012.  Findings: Large dental caries of the left mandibular wisdom tooth again noted.  Periapical lucency at the left maxillary anterior molar better seen on comparison.  No right side maxillary or mandible dentition peri apical lucency.  Small dental caries better seen on comparison (Non occlusal surface posterior right maxillary molar).  Mandible intact.  IMPRESSION: Left greater than right dental caries.  No periapical lucency identified in the area of the right buccal space collection.  Original Report Authenticated By: Randall An, M.D.   Ct Maxillofacial W/cm  04/01/2012  *RADIOLOGY REPORT*  Clinical Data: Swelling on the right for several weeks.  Jaw pain.  CT MAXILLOFACIAL WITH CONTRAST  Technique:  Multidetector CT imaging of the maxillofacial structures was performed with intravenous contrast. Multiplanar CT image reconstructions were also generated.  Contrast: 52mL OMNIPAQUE IOHEXOL 300 MG/ML  SOLN  Comparison: None.  Findings: In the right buccal region, there is a complex fluid collection spanning over 5 x 2.1 x 1.1 cm.  This is immediately adjacent to the significant dental disease.  Mild cervical spondylotic changes.  Visualized mastoid air cells, middle ear cavities and paranasal sinuses are clear.  IMPRESSION: Probable abscess right buccal region most likely dental in origin given the degree of significant caries and periapical lucency of the molars.  Results called to Bari Mantis patient's nurse 04/01/2012 3:20 a.m. with request to call report first in the morning.  Original Report Authenticated By: Doug Sou, M.D.     Recent Results (from the past 720 hour(s))  CULTURE,  BLOOD (ROUTINE X 2)     Status: Normal   Collection Time   03/11/12  1:00 PM      Component Value Range Status Comment   Specimen Description BLOOD RIGHT ANTECUBITAL   Final    Special Requests NONE BOTTLES DRAWN AEROBIC AND ANAEROBIC [facility]   Final    Culture  Setup Time [id number]   Final    Culture     Final    Value: STREPTOCOCCUS PNEUMONIAE     Note: SUSCEPTIBILITIES PERFORMED ON PREVIOUS CULTURE WITHIN THE LAST 5 DAYS.     Note: Gram Stain Report Called to,Read Back By and Verified With: COURTNEY DRIGGERS @ [id number] 03/12/12 [place]   Report Status 03/14/2012 FINAL   Final   CULTURE, BLOOD (ROUTINE X 2)     Status: Normal   Collection Time   03/11/12  2:10 PM      Component Value Range Status Comment   Specimen Description BLOOD RIGHT HAND   Final    Special Requests NONE BOTTLES DRAWN AEROBIC AND ANAEROBIC [facility]   Final    Culture  Setup Time [id number]   Final    Culture     Final    Value: GRAM POSITIVE COCCI IN PAIRS     STREPTOCOCCUS PNEUMONIAE     Note: Gram Stain Report Called to,Read Back By and Verified With: COURTNEY DRIGGERS @ [id number] 03/12/12 [place]   Report Status 03/14/2012 FINAL   Final    Organism ID, Bacteria STREPTOCOCCUS PNEUMONIAE   Final   CULTURE, BLOOD (ROUTINE X 2)     Status: Normal   Collection Time   03/15/12  1:15 PM      Component Value Range Status Comment   Specimen Description BLOOD RIGHT ARM   Final    Special Requests BOTTLES DRAWN AEROBIC AND ANAEROBIC 10CC   Final    Culture  Setup Time [id number]   Final    Culture NO GROWTH 5 DAYS   Final    Report Status 03/21/2012 FINAL   Final   CULTURE, BLOOD (ROUTINE X 2)     Status: Normal   Collection Time   03/15/12  1:20 PM      Component Value Range Status Comment   Specimen Description BLOOD LEFT ARM   Final    Special Requests BOTTLES DRAWN AEROBIC AND ANAEROBIC 10CC   Final    Culture  Setup Time [id number]   Final    Culture NO GROWTH 5 DAYS   Final    Report Status 03/21/2012 FINAL    Final  MRSA PCR SCREENING     Status: Normal   Collection Time   03/16/12 11:50 AM      Component Value Range Status Comment   MRSA by PCR NEGATIVE  NEGATIVE  Final   CULTURE, BLOOD (ROUTINE X 2)     Status: Normal (Preliminary result)   Collection Time   03/28/12 12:15 AM      Component Value Range Status Comment   Specimen Description BLOOD LEFT ARM   Final    Special Requests BOTTLES DRAWN AEROBIC AND ANAEROBIC 10CC   Final    Culture  Setup Time [id number]   Final    Culture     Final    Value:        BLOOD CULTURE RECEIVED NO GROWTH TO DATE CULTURE WILL BE HELD FOR 5 DAYS BEFORE ISSUING A FINAL NEGATIVE REPORT   Report Status PENDING   Incomplete   CULTURE, BLOOD (ROUTINE X 2)     Status: Normal (Preliminary result)   Collection Time   03/28/12 12:25 AM      Component Value Range Status Comment   Specimen Description BLOOD RIGHT ARM   Final    Special Requests BOTTLES DRAWN AEROBIC ONLY 10CC   Final    Culture  Setup Time [id number]   Final    Culture     Final    Value:        BLOOD CULTURE RECEIVED NO GROWTH TO DATE CULTURE WILL BE HELD FOR 5 DAYS BEFORE ISSUING A FINAL NEGATIVE REPORT   Report Status PENDING   Incomplete      Impression/Recommendation  74 -year-old with recent pneumococcal bacteremia and pneumonia readmitted with apparent healthcare associated pneumonia and a buccal abscess.  1) HCAP: --agree with vancomycin and zosyn and should finish 7 days of therapy minimum --Unfortunately no culture data to help narrow therapy  2) Buccal abscess: Dr. Hoyt Koch thinks this is due to parotid disease which would make MSSA MRSA more likely culprits. Still need to keep in mid odontogenic flora as well. He is well covered with current regimen. --will followup culture data and narrow antibiotics for buccal abscess as able and AFTER we have finished rx for HCAP  3) Pleural effusions: Likely parapneumonic but Dr. Lamonte Sakai feels they are too small to hazard tapping them  4) Mx  infections: I dont see need to workup for immunodeficiency state. His buccal abscess and HCAP could all have been due to hospitalization causing hcap and parotitis or he might have coincident bad dental disease. No prior hx of recurrent infections  Thank you so much for this interesting consult,   Alcide Evener 04/02/2012, 12:52 PM   [phone] (pager) [phone] (office)

## 2012-04-03 ENCOUNTER — Encounter (HOSPITAL_COMMUNITY): Payer: Self-pay | Admitting: Gastroenterology

## 2012-04-03 ENCOUNTER — Inpatient Hospital Stay (HOSPITAL_COMMUNITY): Payer: Managed Care, Other (non HMO)

## 2012-04-03 DIAGNOSIS — I319 Disease of pericardium, unspecified: Secondary | ICD-10-CM

## 2012-04-03 LAB — COMPREHENSIVE METABOLIC PANEL
ALT: 128 U/L — ABNORMAL HIGH (ref 0–53)
ALT: 141 U/L — ABNORMAL HIGH (ref 0–53)
AST: 161 U/L — ABNORMAL HIGH (ref 0–37)
AST: 132 U/L — ABNORMAL HIGH (ref 0–37)
Albumin: 1.8 g/dL — ABNORMAL LOW (ref 3.5–5.2)
Albumin: 1.9 g/dL — ABNORMAL LOW (ref 3.5–5.2)
Alkaline Phosphatase: 1356 U/L — ABNORMAL HIGH (ref 39–117)
Alkaline Phosphatase: 1491 U/L — ABNORMAL HIGH (ref 39–117)
BUN: 27 mg/dL — ABNORMAL HIGH (ref 6–23)
BUN: 27 mg/dL — ABNORMAL HIGH (ref 6–23)
CO2: 28 mEq/L (ref 19–32)
CO2: 29 mEq/L (ref 19–32)
Calcium: 8.5 mg/dL (ref 8.4–10.5)
Calcium: 8.7 mg/dL (ref 8.4–10.5)
Chloride: 95 mEq/L — ABNORMAL LOW (ref 96–112)
Chloride: 97 mEq/L (ref 96–112)
Creatinine, Ser: 1.66 mg/dL — ABNORMAL HIGH (ref 0.50–1.35)
Creatinine, Ser: 1.54 mg/dL — ABNORMAL HIGH (ref 0.50–1.35)
GFR calc Af Amer: 56 mL/min — ABNORMAL LOW (ref >90)
GFR calc Af Amer: 61 mL/min — ABNORMAL LOW (ref >90)
GFR calc non Af Amer: 48 mL/min — ABNORMAL LOW (ref >90)
GFR calc non Af Amer: 53 mL/min — ABNORMAL LOW (ref >90)
Glucose, Bld: 240 mg/dL — ABNORMAL HIGH (ref 70–99)
Glucose, Bld: 88 mg/dL (ref 70–99)
Potassium: 4.1 mEq/L (ref 3.5–5.1)
Potassium: 4.1 mEq/L (ref 3.5–5.1)
Sodium: 132 mEq/L — ABNORMAL LOW (ref 135–145)
Sodium: 137 mEq/L (ref 135–145)
Total Bilirubin: 0.6 mg/dL (ref 0.3–1.2)
Total Bilirubin: 0.6 mg/dL (ref 0.3–1.2)
Total Protein: 6.7 g/dL (ref 6.0–8.3)
Total Protein: 6.5 g/dL (ref 6.0–8.3)

## 2012-04-03 LAB — TYPE AND SCREEN
ABO/RH(D): O POS
Antibody Screen: NEGATIVE
Unit division: 0

## 2012-04-03 LAB — CULTURE, BLOOD (ROUTINE X 2)
Culture  Setup Time: 201304060313
Culture  Setup Time: 201304060313
Culture: NO GROWTH
Culture: NO GROWTH

## 2012-04-03 LAB — URINALYSIS, ROUTINE W REFLEX MICROSCOPIC
Bilirubin Urine: NEGATIVE
Glucose, UA: NEGATIVE mg/dL
Ketones, ur: NEGATIVE mg/dL
Leukocytes, UA: NEGATIVE
Nitrite: NEGATIVE
Protein, ur: 100 mg/dL — AB
Specific Gravity, Urine: 1.009 (ref 1.005–1.030)
Urobilinogen, UA: 0.2 mg/dL (ref 0.0–1.0)
pH: 6.5 (ref 5.0–8.0)

## 2012-04-03 LAB — URINE MICROSCOPIC-ADD ON

## 2012-04-03 LAB — CREATININE, URINE, RANDOM: Creatinine, Urine: 32.13 mg/dL

## 2012-04-03 LAB — GLUCOSE, CAPILLARY
Glucose-Capillary: 220 mg/dL — ABNORMAL HIGH (ref 70–99)
Glucose-Capillary: 94 mg/dL (ref 70–99)
Glucose-Capillary: 64 mg/dL — ABNORMAL LOW (ref 70–99)
Glucose-Capillary: 81 mg/dL (ref 70–99)
Glucose-Capillary: 74 mg/dL (ref 70–99)
Glucose-Capillary: 164 mg/dL — ABNORMAL HIGH (ref 70–99)

## 2012-04-03 LAB — HEMOGLOBIN AND HEMATOCRIT, BLOOD
HCT: 26.4 % — ABNORMAL LOW (ref 39.0–52.0)
Hemoglobin: 8.5 g/dL — ABNORMAL LOW (ref 13.0–17.0)

## 2012-04-03 LAB — NA AND K (SODIUM & POTASSIUM), RAND UR
Potassium Urine: 22 mEq/L
Sodium, Ur: 35 mEq/L

## 2012-04-03 LAB — ALPHA-1-ANTITRYPSIN: A-1 Antitrypsin, Ser: 332 mg/dL — ABNORMAL HIGH (ref 90–200)

## 2012-04-03 MED ORDER — LEVOFLOXACIN IN D5W 750 MG/150ML IV SOLN
750.0000 mg | INTRAVENOUS | Status: DC
  Administered 2012-04-03 – 2012-04-05 (×3): 750 mg via INTRAVENOUS
  Filled 2012-04-03 (×4): qty 150

## 2012-04-03 MED ORDER — SODIUM CHLORIDE 0.9 % IV SOLN
INTRAVENOUS | Status: AC
  Administered 2012-04-03: 100 mL/h via INTRAVENOUS

## 2012-04-03 MED ORDER — HYDROCODONE-ACETAMINOPHEN 10-325 MG PO TABS
2.0000 | ORAL_TABLET | ORAL | Status: DC | PRN
  Administered 2012-04-03 – 2012-04-05 (×3): 2 via ORAL
  Filled 2012-04-03 (×3): qty 2

## 2012-04-03 MED ORDER — LINEZOLID 600 MG PO TABS
600.0000 mg | ORAL_TABLET | Freq: Two times a day (BID) | ORAL | Status: DC
  Administered 2012-04-03 – 2012-04-13 (×21): 600 mg via ORAL
  Filled 2012-04-03 (×26): qty 1

## 2012-04-03 MED ORDER — DEXTROSE 50 % IV SOLN
INTRAVENOUS | Status: AC
  Administered 2012-04-03: 50 mL
  Filled 2012-04-03: qty 50

## 2012-04-03 MED ORDER — ALBUTEROL SULFATE (5 MG/ML) 0.5% IN NEBU
2.5000 mg | INHALATION_SOLUTION | Freq: Four times a day (QID) | RESPIRATORY_TRACT | Status: DC
  Administered 2012-04-03 – 2012-04-06 (×12): 2.5 mg via RESPIRATORY_TRACT
  Filled 2012-04-03 (×12): qty 0.5

## 2012-04-03 MED ORDER — METRONIDAZOLE IN NACL 5-0.79 MG/ML-% IV SOLN
500.0000 mg | Freq: Three times a day (TID) | INTRAVENOUS | Status: AC
Start: 2012-04-03 — End: 2012-04-04
  Administered 2012-04-03 – 2012-04-04 (×2): 500 mg via INTRAVENOUS
  Filled 2012-04-03 (×3): qty 100

## 2012-04-03 MED ORDER — GUAIFENESIN ER 600 MG PO TB12
1200.0000 mg | ORAL_TABLET | Freq: Two times a day (BID) | ORAL | Status: DC
  Administered 2012-04-03 – 2012-04-13 (×20): 1200 mg via ORAL
  Filled 2012-04-03 (×26): qty 2

## 2012-04-03 MED ORDER — LIDOCAINE 5 % EX PTCH
2.0000 | MEDICATED_PATCH | CUTANEOUS | Status: DC
  Administered 2012-04-03 – 2012-04-08 (×6): 2 via TRANSDERMAL
  Filled 2012-04-03 (×7): qty 2

## 2012-04-03 NOTE — Progress Notes (Signed)
Physical Therapy Treatment Patient Details Name: [name] [name] MRN: [id number] DOB: [birth year] Today's Date: 04/03/2012  PT Assessment/Plan  PT - Assessment/Plan Comments on Treatment Session: Pt's amb/ balance is improving. Pt had LOB 1x and needed less standing breaks during amb with sudden turns. Pt still unsteadiness but was able to regain himself with assistance. Pt instructed to use hand rails during stairs for safey.  PT Plan: Discharge plan remains appropriate;Frequency remains appropriate PT Frequency: Min 3X/week Follow Up Recommendations: Home health PT Equipment Recommended: Rolling walker with 5" wheels PT Goals  Acute Rehab PT Goals PT Goal: Sit to Stand - Progress: Progressing toward goal PT Goal: Stand to Sit - Progress: Progressing toward goal PT Goal: Stand - Progress: Met PT Goal: Ambulate - Progress: Met PT Goal: Up/Down Stairs - Progress: Progressing toward goal  PT Treatment Precautions/Restrictions  Precautions Precautions: Fall Required Braces or Orthoses DO NOT USE: No Restrictions Weight Bearing Restrictions: No Mobility (including Balance) Bed Mobility Bed Mobility: No Transfers Transfers: Yes Sit to Stand: 6: Modified independent (Device/Increase time) Stand to Sit: 6: Modified independent (Device/Increase time) Ambulation/Gait Ambulation/Gait: Yes Ambulation/Gait Assistance: 4: Min assist (Minguard A for safety ) Ambulation/Gait Assistance Details (indicate cue type and reason): Pt had LOB but was able to regain balance without assistance. Pt took 2 standing break with sudden turns. He walked with a slight "waddle."   Ambulation Distance (Feet): 900 Feet Assistive device: None Gait Pattern: Within Functional Limits Stairs: Yes Stairs Assistance: 4: Min assist (Minguard for safety) Stair Management Technique: One rail Left Number of Stairs: 20  (5 steps x4 in gym )   End of Session PT - End of Session Equipment Utilized During  Treatment: Gait belt Activity Tolerance: Patient tolerated treatment well Patient left: in chair Nurse Communication: Mobility status for transfers;Need for lift equipment General Behavior During Session: [facility] for tasks performed Cognition: [facility] for tasks performed  Cathlean Cower 04/03/2012, 2:55 PM

## 2012-04-03 NOTE — Progress Notes (Signed)
   CARE MANAGEMENT NOTE 04/03/2012  Patient:  [name] [name], [name] [name]   Account Number:  [id number]  Date Initiated:  03/31/2012  Documentation initiated by:  Lizabeth Leyden  Subjective/Objective Assessment:   Patient admitted with cough and chest pain.     Action/Plan:   Progression of care and discharge planning   Anticipated DC Date:  04/02/2012   Anticipated DC Plan:  [place]  CM consult      PAC Choice  [place]   Choice offered to / List presented to:  C-1 Patient   DME arranged  [place]      DME agency  [place] arranged  [facility]      [place].   Status of service:  In process, will continue to follow Medicare Important Message given?   (If response is "NO", the following Medicare IM given date fields will be blank) Date Medicare IM given:   Date Additional Medicare IM given:    Discharge Disposition:    Per UR Regulation:    If discussed at Long Length of Stay Meetings, dates discussed:    Comments:  PCP: Dr Nolon Rod  04/03/2012  [place], [place]  [phone] Met with patient to discuss discharge plans for home health services.  Reviewed home health agencies, he selected [facility] for Pt and OT. DME: RW and Tub bench [employer]. [facility]/ [facility] referral for PT and OT [facility]/ referral for rollining walker and tub bench.  04/01/12 Lars Pinks, RN ,BSN [id number] PT HAS A DENTAL ABCESS AND WILL NEED TEETH EXTRACTED.  PT STATED THAT HE LIVES ON THE 3RD FLOOR OF AN APARTMENT BUILDING AND FEELS THAT HE WOULD BENEFIT FROM HH PT AND A TUB BENCH.  PT/OT TO EVAL PT.  WILL F/U ON DC NEEDS.  03/30/2012 [place], [place] Met with patient to discuss discharge planning. CM to continue to follow for discharge needs.

## 2012-04-03 NOTE — Progress Notes (Signed)
Subjective: Pt with worsened elevation of aklaline phosphatase and transminiases today   Antibiotics:  Anti-infectives     Start     Dose/Rate Route Frequency Ordered Stop   04/03/12 1400   levofloxacin (LEVAQUIN) IVPB 750 mg        750 mg 100 mL/hr over 90 Minutes Intravenous Every 24 hours 04/03/12 1155     04/03/12 1200   metroNIDAZOLE (FLAGYL) IVPB 500 mg        500 mg 100 mL/hr over 60 Minutes Intravenous Every 8 hours 04/03/12 1145     04/03/12 1200   linezolid (ZYVOX) tablet 600 mg        600 mg Oral Every 12 hours 04/03/12 1145     03/31/12 2000   piperacillin-tazobactam (ZOSYN) IVPB 3.375 g  Status:  Discontinued        3.375 g 12.5 mL/hr over 240 Minutes Intravenous Every 8 hours 03/31/12 1841 04/03/12 1145   03/28/12 1000   vancomycin (VANCOCIN) IVPB 1000 mg/200 mL premix  Status:  Discontinued        1,000 mg 200 mL/hr over 60 Minutes Intravenous Every 8 hours 03/28/12 0715 04/03/12 1008   03/28/12 1000   piperacillin-tazobactam (ZOSYN) IVPB 3.375 g  Status:  Discontinued        3.375 g 12.5 mL/hr over 240 Minutes Intravenous 3 times per day 03/28/12 0726 03/31/12 1841   03/28/12 0200   vancomycin (VANCOCIN) IVPB 1000 mg/200 mL premix        1,000 mg 200 mL/hr over 60 Minutes Intravenous  Once 03/28/12 0148 03/28/12 0413   03/28/12 0200  piperacillin-tazobactam (ZOSYN) IVPB 3.375 g       3.375 g 12.5 mL/hr over 240 Minutes Intravenous  Once 03/28/12 0148 03/28/12 0626          Medications: Scheduled Meds:   . albuterol  2.5 mg Nebulization Q6H  . amLODipine  5 mg Oral Daily  . atorvastatin  20 mg Oral q1800  . dextrose      . docusate sodium  100 mg Oral BID  . ferrous sulfate  325 mg Oral BID WC  . guaiFENesin  1,200 mg Oral BID  . insulin aspart  0-9 Units Subcutaneous TID WC  . insulin aspart  13 Units Subcutaneous TID AC  . insulin glargine  40 Units Subcutaneous QHS  . levofloxacin (LEVAQUIN) IV  750 mg Intravenous Q24H  . lidocaine  2 patch  Transdermal Q24H  . linezolid  600 mg Oral Q12H  . metoprolol tartrate  50 mg Oral BID  . metronidazole  500 mg Intravenous Q8H  . senna  1 tablet Oral BID  . sodium chloride  3 mL Intravenous Q12H  . DISCONTD: furosemide  40 mg Oral Daily  . DISCONTD: piperacillin-tazobactam (ZOSYN)  IV  3.375 g Intravenous Q8H  . DISCONTD: vancomycin  1,000 mg Intravenous Q8H   Continuous Infusions:   . sodium chloride 100 mL/hr (04/03/12 1839)   PRN Meds:.sodium chloride, acetaminophen, acetaminophen, HYDROcodone-acetaminophen, morphine, ondansetron (ZOFRAN) IV, ondansetron, oxyCODONE, sodium chloride   Objective: Weight change: 7 lb 7.4 oz (3.384 kg)  Intake/Output Summary (Last 24 hours) at 04/03/12 1851 Last data filed at 04/03/12 1025  Gross per 24 hour  Intake    983 ml  Output   1300 ml  Net   -317 ml   Blood pressure 149/91, pulse 81, temperature 99 F (37.2 C), temperature source Oral, resp. rate 18, height 6\' 2"  (1.88 m), weight 198 lb 9.6 oz (  90.084 kg), SpO2 88.00%. Temp:  [99 F (37.2 C)-99.4 F (37.4 C)] 99 F (37.2 C) (04/12 1323) Pulse Rate:  [70-96] 81  (04/12 1323) Resp:  [18-20] 18  (04/12 1323) BP: (133-152)/(81-96) 149/91 mmHg (04/12 1323) SpO2:  [88 %-93 %] 88 % (04/12 1700) Weight:  [198 lb 9.6 oz (90.084 kg)] 198 lb 9.6 oz (90.084 kg) (04/12 0500)  Physical Exam: General: Alert and awake, oriented x3, not in any acute distress. HEENT: anicteric sclera, pupils reactive to light and accommodation, EOMI CVS regular rate, normal r,  no murmur rubs or gallops Chest: clear to auscultation bilaterally, no wheezing, rales or rhonchi Abdomen: soft nontender, nondistended, normal bowel sounds, Extremities: no  clubbing or edema noted bilaterally Skin: no rashes Lymph: no new lymphadenopathy Neuro: nonfocal  Lab Results:  Basename 04/03/12 0953 04/02/12 0605 04/01/12 0630  WBC -- 11.0* 9.1  HGB 8.5* 7.6* --  HCT 26.4* 23.2* --  PLT -- 271 313     BMET  Basename 04/03/12 1120 04/03/12 0630  NA 137 132*  K 4.1 4.1  CL 97 95*  CO2 29 28  GLUCOSE 88 240*  BUN 27* 27*  CREATININE 1.54* 1.66*  CALCIUM 8.7 8.5    Micro Results: Recent Results (from the past 240 hour(s))  CULTURE, BLOOD (ROUTINE X 2)     Status: Normal   Collection Time   03/28/12 12:15 AM      Component Value Range Status Comment   Specimen Description BLOOD LEFT ARM   Final    Special Requests BOTTLES DRAWN AEROBIC AND ANAEROBIC 10CC   Final    Culture  Setup Time [id number]   Final    Culture NO GROWTH 5 DAYS   Final    Report Status 04/03/2012 FINAL   Final   CULTURE, BLOOD (ROUTINE X 2)     Status: Normal   Collection Time   03/28/12 12:25 AM      Component Value Range Status Comment   Specimen Description BLOOD RIGHT ARM   Final    Special Requests BOTTLES DRAWN AEROBIC ONLY 10CC   Final    Culture  Setup Time [id number]   Final    Culture NO GROWTH 5 DAYS   Final    Report Status 04/03/2012 FINAL   Final   WOUND CULTURE     Status: Normal (Preliminary result)   Collection Time   04/02/12 12:30 PM      Component Value Range Status Comment   Specimen Description WOUND MOUTH RIGHT   Final    Special Requests ORAL MUCOSA   Final    Gram Stain     Final    Value: FEW WBC PRESENT, PREDOMINANTLY PMN     NO SQUAMOUS EPITHELIAL CELLS SEEN     NO ORGANISMS SEEN   Culture NO GROWTH 1 DAY   Final    Report Status PENDING   Incomplete     Studies/Results: US Abdomen Complete  04/03/2012  *RADIOLOGY REPORT*  Clinical Data:  Elevated liver enzymes.  Acute renal failure.  COMPLETE ABDOMINAL ULTRASOUND  Comparison:  HIDA 03/14/2012, ultrasound 03/12/2012  Findings:  Gallbladder:  There is layering sludge within the gallbladder.  The gallbladder wall is upper limits of normal 3 mm.  No pericholecystic fluid.  Negative sonographic Murphy's sign.  Common bile duct:  Normal at 4 mm  Liver:  No focal lesion identified.  Within normal limits in parenchymal  echogenicity.  IVC:  Appears normal.  Pancreas:  No focal  abnormality seen.  Spleen:  Normal size and echogenicity.  Right Kidney:  12.2cm in length.  No evidence of hydronephrosis or stones.  Left Kidney:  12.9cm in length.  No evidence of hydronephrosis or stones.  Abdominal aorta:  No aneurysm identified.  IMPRESSION:  1.  Small sludge within the gallbladder is similar to comparison exam. 2.  Liver parenchyma appears normal without evidence of duct dilatation.  Of note on the prior HIDA  scan (03/14/2012),  there is a delayed clearance of radiotracer from the liver suggesting cholestasis or hepatitis.  Original Report Authenticated By: Suzy Bouchard, M.D.      Assessment/Plan: [name] [name] is a 46 y.o. male with recent pneumococcal bacteremia and pneumonia readmitted with apparent healthcare associated pneumonia and a buccal abscess.Marland Kitchen He is now having worsening elevation of alkaline phosphatase, transaminases, and worsened renal fxn  1) HCAP:  --will change to zyvox, levaquin and flagyl to finish 7 days of therapy   2) Buccal abscess: Dr. Hoyt Koch thinks this is due to parotid disease which would make MSSA MRSA more likely culprits. Still need to keep in mid odontogenic flora as well. He is well covered with current regimen.  --changed to zyvox and could continue this to cover for MRSA, MSSA, most oral flora, another cheaper alternative would be avelox and doxycyline  3) Elevated Alkaline phosphatase, transaminases: Primary team were concerned this could be antibiotic effect, therefore changed regimen in particular to avoid the beta lactams  3) Pleural effusions: Likely parapneumonic but Dr. Lamonte Sakai feels they are too small to hazard tapping them   4) Mx infections: I dont see need to workup for immunodeficiency state. His buccal abscess and HCAP could all have been due to hospitalization causing hcap and parotitis or he might have coincident bad dental disease. No prior hx of recurrent  infections   Dr. Megan Salon available on the weekend for questions.    LOS: 7 days   Alcide Evener 04/03/2012, 6:51 PM

## 2012-04-03 NOTE — Progress Notes (Signed)
Nutrition Follow-up  Diet Order:  CHO Modified Medium; meal completion 100%. Pt s/p I&D of Right parotid abscess 04/02/12. Pt reports no problems eating. Pt asking for meal ideas.  Reviewed CHO counting with pt, especially for Breakfast meal. Handout provided. Pt seemed very interested in following CHO modified diet. Expect good compliance.  Meds: Scheduled Meds:   . albuterol  2.5 mg Nebulization Q6H  . amLODipine  5 mg Oral Daily  . atorvastatin  20 mg Oral q1800  . docusate sodium  100 mg Oral BID  . ferrous sulfate  325 mg Oral BID WC  . furosemide  20 mg Intravenous Once  . furosemide  40 mg Oral Daily  . guaiFENesin  1,200 mg Oral BID  . insulin aspart  0-9 Units Subcutaneous TID WC  . insulin aspart  13 Units Subcutaneous TID AC  . insulin glargine  40 Units Subcutaneous QHS  . lidocaine  2 patch Transdermal Q24H  . lidocaine      . metoprolol tartrate  50 mg Oral BID  . piperacillin-tazobactam (ZOSYN)  IV  3.375 g Intravenous Q8H  . senna  1 tablet Oral BID  . sodium chloride  3 mL Intravenous Q12H  . DISCONTD: vancomycin  1,000 mg Intravenous Q8H   Continuous Infusions:  PRN Meds:.sodium chloride, acetaminophen, acetaminophen, HYDROcodone-acetaminophen, morphine, ondansetron (ZOFRAN) IV, ondansetron, oxyCODONE, sodium chloride  Labs:  CMP     Component Value Date/Time   NA 132* 04/03/2012 0630   K 4.1 04/03/2012 0630   CL 95* 04/03/2012 0630   CO2 28 04/03/2012 0630   GLUCOSE 240* 04/03/2012 0630   BUN 27* 04/03/2012 0630   CREATININE 1.66* 04/03/2012 0630   CALCIUM 8.5 04/03/2012 0630   PROT 6.7 04/03/2012 0630   ALBUMIN 1.8* 04/03/2012 0630   AST 161* 04/03/2012 0630   ALT 128* 04/03/2012 0630   ALKPHOS 1356* 04/03/2012 0630   BILITOT 0.6 04/03/2012 0630   GFRNONAA 48* 04/03/2012 0630   GFRAA 56* 04/03/2012 0630   CBG (last 3)   Basename 04/03/12 0641 04/02/12 2153 04/02/12 1634  GLUCAP 220* 166* 117*    Intake/Output Summary (Last 24 hours) at 04/03/12 1048 Last  data filed at 04/03/12 1025  Gross per 24 hour  Intake   1453 ml  Output   2500 ml  Net  -1047 ml    Weight Status:   04/03/12 198 lbs- stable  Estimated needs:  Kcal: 2100-2300 Protein: 85-100 gm  Nutrition Dx:  Food and nutrition related knowledge deficit (NB-1.1). Status: Ongoing  Goal: PO intake will be >75% most meals; met  Intervention:    CHO counting education  Recommend CHO modified high diet to better meet pt's needs.  Monitor:  PO intake, weight, labs, I/O's, education needs   Maylon Peppers [place] Pager #:  [phone]

## 2012-04-03 NOTE — Progress Notes (Signed)
CBG: 64  Treatment: D50 IV 25 mL  Symptoms: Shaky  Follow-up CBG: Time:1547 CBG Result:81 (pt getting [place] of Abdomen)   Possible Reasons for Event: Inadequate meal intake  Comments/MD notified:    Crotts, Kirsten Spearing Renea

## 2012-04-03 NOTE — Progress Notes (Signed)
   CARE MANAGEMENT NOTE 04/03/2012  Patient:  [name] [name], [name] [name]   Account Number:  [id number]  Date Initiated:  03/31/2012  Documentation initiated by:  Lizabeth Leyden  Subjective/Objective Assessment:   Patient admitted with cough and chest pain.     Action/Plan:   Progression of care and discharge planning   Anticipated DC Date:  04/02/2012   Anticipated DC Plan:  [place]  CM consult      PAC Choice  [place]   Choice offered to / List presented to:  C-1 Patient   DME arranged  [place]      DME agency  [place] arranged  [facility]      [place].   Status of service:  In process, will continue to follow Medicare Important Message given?   (If response is "NO", the following Medicare IM given date fields will be blank) Date Medicare IM given:   Date Additional Medicare IM given:    Discharge Disposition:    Per UR Regulation:    If discussed at Long Length of Stay Meetings, dates discussed:    Comments:  PCP: Dr Nolon Rod  04/03/12 Lars Pinks, RN, BSN 1434 UR COMPLETED  04/03/2012 [place], [place]: will deliver the DME to hospital today.  04/03/2012  [place], [employer] Met with patient to discuss discharge plans for home health services.  Reviewed home health agencies, he selected [facility] for Pt and OT. DME: RW and Tub bench [facility]. [facility]/ [facility] referral for PT and OT [facility]/ referral for rollining walker and tub bench.  04/01/12 Lars Pinks, RN ,BSN 1636 PT HAS A DENTAL ABCESS AND WILL NEED TEETH EXTRACTED.  PT STATED THAT HE LIVES ON THE 3RD FLOOR OF AN APARTMENT BUILDING AND FEELS THAT HE WOULD BENEFIT FROM HH PT AND A TUB BENCH.  PT/OT TO EVAL PT.  WILL F/U ON DC NEEDS.  03/30/2012 [place], [place] Met with patient to discuss discharge  planning. CM to continue to follow for discharge needs.

## 2012-04-03 NOTE — Progress Notes (Signed)
Patient: [name] [name] DOB: 1966/10/20 Date of Admission: 03/27/2012 PCP - [name], MD            Pulmonary consult f/u Date of Consult: 04/03/2012 MD requesting consult:  [facility] Sarajane Jews) Reason for consult: Recurrent PNA  HPI - 45yo male with hx poorly controlled DM with recent admission for PNA and strep bacteremia c/b acute renal failure. He was d/c 3/25 with PICC for Ceftriaxone through 4/2.  He returned 4/6 with cough, SOB and R sided pleuritic chest pain.  Re-admitted by [facility] with recurrent PNA, R>L pleural effusions, DM and anemia.  PCCM consulted 4/8 for assist and ?FOB.   Culture data:  4/6: BCX2>>> 4/11 mouth I&D>>>  abx Zosyn 4/6>>> vanc 4/6>>>  Subjective: C/o right sided CP and discomfort.   BP 145/86  Pulse 70  Temp(Src) 99 F (37.2 C) (Oral)  Resp 18  Ht 6\' 2"  (1.88 m)  Wt 90.084 kg (198 lb 9.6 oz)  BMI 25.50 kg/m2  SpO2 92% 2 liters  EXAM: General: pleasant male, NAD HEENT: mm moist, R buccal mucosa mass Neuro: awake, alert, appropriate CV: s1s2 rrr PULM: resps even,labored w/ exertion. diminished R>L base, few scattered ronchi GI: abd soft, non tender, +bs Extremities:  Warm and dry, 1-2+ BLE edema, pale chest X-ray Dg Orthopantogram  04/01/2012  *RADIOLOGY REPORT*  Clinical Data: 46 year old male with poor dentition, swelling of the right upper jaw and tooth ache.  ORTHOPANTOGRAM/PANORAMIC  Comparison: Face CT 03/31/2012.  Findings: Large dental caries of the left mandibular wisdom tooth again noted.  Periapical lucency at the left maxillary anterior molar better seen on comparison.  No right side maxillary or mandible dentition peri apical lucency.  Small dental caries better seen on comparison (Non occlusal surface posterior right maxillary molar).  Mandible intact.  IMPRESSION: Left greater than right dental caries.  No periapical lucency identified in the area of the right buccal space collection.  Original Report Authenticated By: Joni Fears III, M.D.    CT chest 4/6>>>Right upper/lower lobe pneumonia, with associated moderate right pleural effusion. Patchy left lower lobe opacity, atelectasis versus pneumonia, with associated small left pleural effusion. No obvious endobronchial lesion  Bedside US exam 4/12: right chest examined at bedside. Very little pleural fluid. Mostly consolidated lung. Fluid collections are minimal and not amendable to draining.  Pictures in chart.   Lab 04/03/12 0630 04/01/12 0630 03/31/12 0500  NA 132* 136 130*  K 4.1 3.5 4.2  CL 95* 98 95*  CO2 28 30 25   BUN 27* 18 31*  CREATININE 1.66* 0.91 1.10  GLUCOSE 240* 99 250*    Lab 04/02/12 0605 04/01/12 0630 03/31/12 0500  HGB 7.6* 8.2* 8.0*  HCT 23.2* 25.0* 23.8*  WBC 11.0* 9.1 10.1  PLT 271 313 281        IMPRESSION/ PLAN:  1. Recurrent PNA -- Recent strep pneumoniae PNA and bacteremia.  D/c home 3/25 with cont IV ceftriaxone through 4/2 per ID.  Worsened 4/6.  Persistent R sided PNA with associated effusion.  This could reflect insufficient treatment time given the severity of the PNA, possible empyema or abscess development. Does increase concern for underlying malignancy and 20 lbs wt loss unexplained! No clear endobronchial lesion or abscess on CT scan 4/6, he has small right effusion. Not enough fluid to tap. There is marked consolidation of the right lung by bedside US Lab 04/02/12 0605 04/01/12 0630 03/31/12 0500 03/30/12 0530 03/29/12 0555  WBC 11.0* 9.1 10.1 10.6* 13.7*  PLAN -  Cont IV abx (now on vanc, zosyn) as directed by ID.  I Would propose longer abx course, transition to oral regimen (soon), then repeat CT scan chest in several weeks to determine whether he will need FOB. If he worsens clinically, then will proceed to FOB sooner Will add pulmonary hygiene and analgesia to assist w/ cough deep breath efforts.   2. Pleural effusions -- likely parapneumonic but small and not large enough to tap by bedside US on 4/12     PLAN -  Cont abx - see above   3. Anemia - Per primary.  S/p colonoscopy 4/10, finding:  right colon polyp, which was removed.     Lab 04/02/12 0605 04/01/12 0630 03/31/12 0500 03/30/12 0530 03/29/12 1614  HGB 7.6* 8.2* 8.0* 6.9* 7.4*  plan: Hold NSAIDS Regular diet Trend CBC  4. DM - per primary , does he need gastroparesis treatment, work up?  5. Transaminitis - per primary   Lab 04/03/12 0630 04/01/12 0630 03/31/12 0500 03/30/12 0530 03/29/12 0555  AST 161* 46* 103* 52* 41*  ALT 128* 91* 127* 78* 69*  ALKPHOS 1356* 714* 792* 494* 347*  BILITOT 0.6 0.5 0.6 0.6 0.6  PROT 6.7 6.8 6.7 6.3 6.6  ALBUMIN 1.8* 2.0* 1.9* 1.9* 2.0*  INR -- -- -- -- --   6. Buccal abscess s/p I&D on 4/11 Plan abx per ID   Attending Addendum:  I have seen the patient, discussed the issues, test results and plans with Jerrye Bushy, NP. I agree with the Assessment and Plans as ammended above.   Baltazar Apo, MD, PhD 04/03/2012, 3:39 PM [facility] [phone] or if no answer [phone]

## 2012-04-03 NOTE — Progress Notes (Signed)
ANTIBIOTIC CONSULT NOTE - FOLLOW UP  Pharmacy Consult for levofloxacin, pt also started on zyvox and flagyl. Vancomycin and zosyn stopped today Indication: HCAP and buccal abscess  Allergies  Allergen Reactions  . Penicillins Rash    Tolerating Ceftriaxone (03/12/12)    Patient Measurements: Height: 6\' 2"  (188 cm) Weight: 198 lb 9.6 oz (90.084 kg) IBW/kg (Calculated) : 82.2  Adjusted Body Weight: Vital Signs: Temp: 99 F (37.2 C) (04/12 0600) BP: 152/96 mmHg (04/12 1054) Pulse Rate: 70  (04/12 0600) Intake/Output from previous day: 04/11 0701 - 04/12 0700 In: 1931 [P.O.:1278; I.V.:3; Blood:350; IV Piggyback:300] Out: 1800 [Urine:1800] Intake/Output from this shift: Total I/O In: -  Out: 700 [Urine:700]  Labs:  [facility] 04/03/12 0953 04/03/12 0630 04/02/12 0605 04/01/12 0630  WBC -- -- 11.0* 9.1  HGB 8.5* -- 7.6* 8.2*  PLT -- -- 271 313  LABCREA -- -- -- --  CREATININE -- 1.66* -- 0.91   Estimated Creatinine Clearance: 65.3 ml/min (by C-G formula based on Cr of 1.66). No results found for this basename: VANCOTROUGH:2,VANCOPEAK:2,VANCORANDOM:2,GENTTROUGH:2,GENTPEAK:2,GENTRANDOM:2,TOBRATROUGH:2,TOBRAPEAK:2,TOBRARND:2,AMIKACINPEAK:2,AMIKACINTROU:2,AMIKACIN:2, in the last 72 hours   Microbiology: Recent Results (from the past 720 hour(s))  CULTURE, BLOOD (ROUTINE X 2)     Status: Normal   Collection Time   03/11/12  1:00 PM      Component Value Range Status Comment   Specimen Description BLOOD RIGHT ANTECUBITAL   Final    Special Requests NONE BOTTLES DRAWN AEROBIC AND ANAEROBIC [facility]   Final    Culture  Setup Time [id number]   Final    Culture     Final    Value: STREPTOCOCCUS PNEUMONIAE     Note: SUSCEPTIBILITIES PERFORMED ON PREVIOUS CULTURE WITHIN THE LAST 5 DAYS.     Note: Gram Stain Report Called to,Read Back By and Verified With: COURTNEY DRIGGERS @ [id number] 03/12/12 [place]   Report Status 03/14/2012 FINAL   Final   CULTURE, BLOOD (ROUTINE X 2)     Status:  Normal   Collection Time   03/11/12  2:10 PM      Component Value Range Status Comment   Specimen Description BLOOD RIGHT HAND   Final    Special Requests NONE BOTTLES DRAWN AEROBIC AND ANAEROBIC [facility]   Final    Culture  Setup Time [id number]   Final    Culture     Final    Value: GRAM POSITIVE COCCI IN PAIRS     STREPTOCOCCUS PNEUMONIAE     Note: Gram Stain Report Called to,Read Back By and Verified With: COURTNEY DRIGGERS @ [id number] 03/12/12 [place]   Report Status 03/14/2012 FINAL   Final    Organism ID, Bacteria STREPTOCOCCUS PNEUMONIAE   Final   CULTURE, BLOOD (ROUTINE X 2)     Status: Normal   Collection Time   03/15/12  1:15 PM      Component Value Range Status Comment   Specimen Description BLOOD RIGHT ARM   Final    Special Requests BOTTLES DRAWN AEROBIC AND ANAEROBIC 10CC   Final    Culture  Setup Time [id number]   Final    Culture NO GROWTH 5 DAYS   Final    Report Status 03/21/2012 FINAL   Final   CULTURE, BLOOD (ROUTINE X 2)     Status: Normal   Collection Time   03/15/12  1:20 PM      Component Value Range Status Comment   Specimen Description BLOOD LEFT ARM   Final    Special Requests  BOTTLES DRAWN AEROBIC AND ANAEROBIC 10CC   Final    Culture  Setup Time [id number]   Final    Culture NO GROWTH 5 DAYS   Final    Report Status 03/21/2012 FINAL   Final   MRSA PCR SCREENING     Status: Normal   Collection Time   03/16/12 11:50 AM      Component Value Range Status Comment   MRSA by PCR NEGATIVE  NEGATIVE  Final   CULTURE, BLOOD (ROUTINE X 2)     Status: Normal   Collection Time   03/28/12 12:15 AM      Component Value Range Status Comment   Specimen Description BLOOD LEFT ARM   Final    Special Requests BOTTLES DRAWN AEROBIC AND ANAEROBIC 10CC   Final    Culture  Setup Time [id number]   Final    Culture NO GROWTH 5 DAYS   Final    Report Status 04/03/2012 FINAL   Final   CULTURE, BLOOD (ROUTINE X 2)     Status: Normal   Collection Time   03/28/12 12:25 AM       Component Value Range Status Comment   Specimen Description BLOOD RIGHT ARM   Final    Special Requests BOTTLES DRAWN AEROBIC ONLY 10CC   Final    Culture  Setup Time [id number]   Final    Culture NO GROWTH 5 DAYS   Final    Report Status 04/03/2012 FINAL   Final   WOUND CULTURE     Status: Normal (Preliminary result)   Collection Time   04/02/12 12:30 PM      Component Value Range Status Comment   Specimen Description WOUND MOUTH RIGHT   Final    Special Requests ORAL MUCOSA   Final    Gram Stain     Final    Value: FEW WBC PRESENT, PREDOMINANTLY PMN     NO SQUAMOUS EPITHELIAL CELLS SEEN     NO ORGANISMS SEEN   Culture NO GROWTH 1 DAY   Final    Report Status PENDING   Incomplete     Anti-infectives     Start     Dose/Rate Route Frequency Ordered Stop   04/03/12 1400   levofloxacin (LEVAQUIN) IVPB 750 mg        750 mg 100 mL/hr over 90 Minutes Intravenous Every 24 hours 04/03/12 1155     04/03/12 1200   metroNIDAZOLE (FLAGYL) IVPB 500 mg        500 mg 100 mL/hr over 60 Minutes Intravenous Every 8 hours 04/03/12 1145     04/03/12 1200   linezolid (ZYVOX) tablet 600 mg        600 mg Oral Every 12 hours 04/03/12 1145     03/31/12 2000   piperacillin-tazobactam (ZOSYN) IVPB 3.375 g  Status:  Discontinued        3.375 g 12.5 mL/hr over 240 Minutes Intravenous Every 8 hours 03/31/12 1841 04/03/12 1145   03/28/12 1000   vancomycin (VANCOCIN) IVPB 1000 mg/200 mL premix  Status:  Discontinued        1,000 mg 200 mL/hr over 60 Minutes Intravenous Every 8 hours 03/28/12 0715 04/03/12 1008   03/28/12 1000   piperacillin-tazobactam (ZOSYN) IVPB 3.375 g  Status:  Discontinued        3.375 g 12.5 mL/hr over 240 Minutes Intravenous 3 times per day 03/28/12 0726 03/31/12 1841   03/28/12 0200   vancomycin (VANCOCIN) IVPB 1000 mg/200  mL premix        1,000 mg 200 mL/hr over 60 Minutes Intravenous  Once 03/28/12 0148 03/28/12 0413   03/28/12 0200  piperacillin-tazobactam (ZOSYN) IVPB  3.375 g       3.375 g 12.5 mL/hr over 240 Minutes Intravenous  Once 03/28/12 0148 03/28/12 0626          Assessment: 46 year old on vancomycin and zosyn for HCAP and buccal abscess to change to levofloxacin, zyvox and flagyl. Today is total abx day # 7.  Creatinine up to 1.66 from 0.91.  Afebrile. Wbc was up to 11 from 9.1 yesterday. Mouth cx ng x 1 day. Blood cxs negative - final.  Plan:  1. Levofloxacin 750 mg IV q24 for HCAP, will f/u renal fxn closely 2. Zyvox 600 mg po bid per MD 3. Flagyl 500 mg IV q8h per MD  Eudelia Bunch, Pharm.D. [id number] 04/03/2012 12:05 PM

## 2012-04-03 NOTE — Progress Notes (Signed)
[name] [name] CSN:[id number],MRN:[id number] is a 46 y.o. male,  Outpatient Primary MD for the patient is [name] Logan, MD, MD  Chief Complaint  Patient presents with  . Emesis        Subjective:   [name] [name] today has, No headache, No chest pain, No abdominal pain - No Nausea, No new weakness tingling or numbness, No Cough - SOB.   Objective:   Filed Vitals:   04/03/12 0500 04/03/12 0600 04/03/12 1054 04/03/12 1100  BP:  145/86 152/96   Pulse:  70    Temp:  99 F (37.2 C)    TempSrc:      Resp:  18    Height:      Weight: 90.084 kg (198 lb 9.6 oz)     SpO2:  92%  93%    Wt Readings from Last 3 Encounters:  04/03/12 90.084 kg (198 lb 9.6 oz)  04/03/12 90.084 kg (198 lb 9.6 oz)  04/03/12 90.084 kg (198 lb 9.6 oz)     Intake/Output Summary (Last 24 hours) at 04/03/12 1152 Last data filed at 04/03/12 1025  Gross per 24 hour  Intake   1453 ml  Output   2500 ml  Net  -1047 ml    Exam Awake Alert, Oriented *3, No new F.N deficits, Normal affect [place].AT,PERRAL. Right-sided facial swelling. Supple Neck,No JVD, No cervical lymphadenopathy appriciated.  Symmetrical Chest wall movement, Good air movement bilaterally, CTAB RRR,No Gallops,Rubs or new Murmurs, No Parasternal Heave +ve B.Sounds, Abd Soft, Non tender, No organomegaly appriciated, No rebound -guarding or rigidity. No Cyanosis, Clubbing , 1+edema, No new Rash or bruise    Data Review  CBC  Lab 04/03/12 0953 04/02/12 0605 04/01/12 0630 03/31/12 0500 03/30/12 0530 03/29/12 0555  WBC -- 11.0* 9.1 10.1 10.6* 13.7*  HGB 8.5* 7.6* 8.2* 8.0* 6.9* --  HCT 26.4* 23.2* 25.0* 23.8* 20.8* --  PLT -- 271 313 281 299 327  MCV -- 87.9 87.4 85.6 88.9 92.1  MCH -- 28.8 28.7 28.8 29.5 29.2  MCHC -- 32.8 32.8 33.6 33.2 31.7  RDW -- 15.1 15.4 15.7* 15.1 14.9  LYMPHSABS -- -- -- -- -- --  MONOABS -- -- -- -- -- --  EOSABS -- -- -- -- -- --  BASOSABS -- -- -- -- -- --  BANDABS -- -- -- -- -- --    Chemistries    Lab 04/03/12 0630 04/01/12 0630 03/31/12 0500 03/30/12 0530 03/29/12 0555  NA 132* 136 130* 130* 130*  K 4.1 3.5 4.2 4.2 4.3  CL 95* 98 95* 97 96  CO2 28 30 25 25 25   GLUCOSE 240* 99 250* 190* 334*  BUN 27* 18 31* 36* 41*  CREATININE 1.66* 0.91 1.10 1.15 1.25  CALCIUM 8.5 8.7 8.7 8.3* 8.2*  MG -- -- 1.9 -- --  AST 161* 46* 103* 52* 41*  ALT 128* 91* 127* 78* 69*  ALKPHOS 1356* 714* 792* 494* 347*  BILITOT 0.6 0.5 0.6 0.6 0.6   ------------------------------------------------------------------------------------------------------------------ estimated creatinine clearance is 65.3 ml/min (by C-G formula based on Cr of 1.66). ------------------------------------------------------------------------------------------------------------------ No results found for this basename: HGBA1C:2 in the last 72 hours ------------------------------------------------------------------------------------------------------------------ No results found for this basename: CHOL:2,HDL:2,LDLCALC:2,TRIG:2,CHOLHDL:2,LDLDIRECT:2 in the last 72 hours ------------------------------------------------------------------------------------------------------------------ No results found for this basename: TSH,T4TOTAL,FREET3,T3FREE,THYROIDAB in the last 72 hours ------------------------------------------------------------------------------------------------------------------ No results found for this basename: VITAMINB12:2,FOLATE:2,FERRITIN:2,TIBC:2,IRON:2,RETICCTPCT:2 in the last 72 hours  Coagulation profile No results found for this basename: INR:5,PROTIME:5 in the last 168 hours  No results found for  this basename: DDIMER:2 in the last 72 hours  Cardiac Enzymes No results found for this basename: CK:3,CKMB:3,TROPONINI:3,MYOGLOBIN:3 in the last 168 hours ------------------------------------------------------------------------------------------------------------------ No components found with this basename:  POCBNP:3  Micro Results Recent Results (from the past 240 hour(s))  CULTURE, BLOOD (ROUTINE X 2)     Status: Normal   Collection Time   03/28/12 12:15 AM      Component Value Range Status Comment   Specimen Description BLOOD LEFT ARM   Final    Special Requests BOTTLES DRAWN AEROBIC AND ANAEROBIC 10CC   Final    Culture  Setup Time [id number]   Final    Culture NO GROWTH 5 DAYS   Final    Report Status 04/03/2012 FINAL   Final   CULTURE, BLOOD (ROUTINE X 2)     Status: Normal   Collection Time   03/28/12 12:25 AM      Component Value Range Status Comment   Specimen Description BLOOD RIGHT ARM   Final    Special Requests BOTTLES DRAWN AEROBIC ONLY 10CC   Final    Culture  Setup Time [id number]   Final    Culture NO GROWTH 5 DAYS   Final    Report Status 04/03/2012 FINAL   Final   WOUND CULTURE     Status: Normal (Preliminary result)   Collection Time   04/02/12 12:30 PM      Component Value Range Status Comment   Specimen Description WOUND MOUTH RIGHT   Final    Special Requests ORAL MUCOSA   Final    Gram Stain     Final    Value: FEW WBC PRESENT, PREDOMINANTLY PMN     NO SQUAMOUS EPITHELIAL CELLS SEEN     NO ORGANISMS SEEN   Culture NO GROWTH 1 DAY   Final    Report Status PENDING   Incomplete     Radiology Reports Dg Chest 2 View  03/28/2012  *RADIOLOGY REPORT*  Clinical Data: Cough, right rib pain, emesis, recent pneumonia  CHEST - 2 VIEW  Comparison: 03/16/2012  Findings: Patchy right lower lobe opacity, suspicious for pneumonia, with associated small-to-moderate right pleural effusion.  Left basilar opacity, atelectasis versus pneumonia, with suspected small left pleural effusion.  No pneumothorax.  The heart is normal in size.  Interval removal of right subclavian PICC.  Visualized osseous structures are within normal limits.  IMPRESSION: Patchy right lower lobe opacity, suspicious for pneumonia, with associated small-to-moderate right pleural effusion.  Left basilar  opacity, atelectasis versus pneumonia, with suspected small left pleural effusion.  Original Report Authenticated By: Julian Hy, M.D.   Dg Chest 2 View  03/16/2012  *RADIOLOGY REPORT*  Clinical Data: History of pneumonia.  CHEST - 2 VIEW  Comparison: 03/11/2012.  Findings: Tip of right PICC terminates in right atrial region.  No pneumothorax is seen.  There is some atelectasis and infiltrate in the medial posterior aspect of the left lung.  There are atelectasis and infiltrative densities seen within the right perihilar region and within the right midlung and right lower lung areas with consolidation and atelectasis.  There is improvement in the amount of infiltrate compared to previous study.   On the lateral image there is continued abnormal opacity inferiorly and posteriorly.  Indistinctness of costophrenic angles may reflect an element of pleural effusion.  IMPRESSION: Right PICC in place.  Some atelectasis and infiltrative density in the medial posterior left lung inferiorly. here are atelectasis and infiltrative densities seen within the right perihilar region  and within the right midlung and right lower lung areas with consolidation and atelectasis.  There is improvement in the amount of infiltrative compared to previous study but clearing has not occurred. Question small amounts of pleural effusion.  Original Report Authenticated By: Delane Ginger, M.D.   Dg Chest 2 View  03/11/2012  *RADIOLOGY REPORT*  Clinical Data: Shortness of breath with right lower chest pain and fever.  CHEST - 2 VIEW  Comparison: None.  Findings: Trachea is midline.  Heart size normal.  There is dense airspace consolidation in the right lower lobe.  Question mild left lower lobe air space disease.  No left pleural fluid.  IMPRESSION:  1.  Dense airspace consolidation in the right lower lobe is most consistent with pneumonia.  Follow-up to clearing is recommended, to exclude a centrally obstructing mass. 2.  Question mild  left lower lobe air space disease.  Original Report Authenticated By: Luretha Rued, M.D.   Ct Chest Wo Contrast  03/28/2012  *RADIOLOGY REPORT*  Clinical Data: Cough, right-sided chest pain  CT CHEST WITHOUT CONTRAST  Technique:  Multidetector CT imaging of the chest was performed following the standard protocol without IV contrast.  Comparison: Chest radiographs dated 03/28/2012.  CT chest dated 03/11/2012.  Findings: Patchy posterior right upper lobe opacity and near complete right lower lobe consolidation, suspicious for pneumonia. Associated moderate right pleural effusion.  Patchy left lower lobe opacity, atelectasis versus pneumonia, with associated small left pleural effusion.  No pneumothorax.  Heart is normal in size.  Small pericardial effusion.  Visualized upper abdomen is unremarkable.  Mild degenerative changes of the visualized thoracolumbar spine.  IMPRESSION: Right upper/lower lobe pneumonia, with associated moderate right pleural effusion.  Patchy left lower lobe opacity, atelectasis versus pneumonia, with associated small left pleural effusion.  Original Report Authenticated By: Julian Hy, M.D.   Ct Angio Chest W/cm &/or Wo Cm  03/11/2012  *RADIOLOGY REPORT*  Clinical Data: Fever.  Cough.  Weakness.  Short of breath.  CT ANGIOGRAPHY CHEST  Technique:  Multidetector CT imaging of the chest using the standard protocol during bolus administration of intravenous contrast. Multiplanar reconstructed images including MIPs were obtained and reviewed to evaluate the vascular anatomy.  Contrast: 48mL OMNIPAQUE IOHEXOL 350 MG/ML IV SOLN  Comparison: Chest radiography same day  Findings: Pulmonary arterial opacification is excellent.  There are no pulmonary emboli.  There is consolidative pneumonia throughout the right lower lobe with minor involvement of the right upper lobe.  There is consolidative pneumonia less extensively in the posterior aspect of the left lower lobe.  There is a small  amount of pleural fluid on the right.  There is a tiny amount of pericardial fluid.  There are small mediastinal lymph nodes, likely reactive.  No upper abdominal pathology is seen.  IMPRESSION: No pulmonary emboli.  Consolidative pneumonia throughout the right lower lobe.  Partial involvement of the left lower lobe by consolidative  pneumonia. Minimal involvement of the right upper lobe.  Original Report Authenticated By: Jules Schick, M.D.   Nm Hepatobiliary  03/14/2012  *RADIOLOGY REPORT*  Clinical Data: Abdominal pain, evaluate for cholecystitis  NUCLEAR MEDICINE HEPATOHBILIARY INCLUDE GB  Radiopharmaceutical:  5.5 mCi technetium 82m Choletec  Comparison: Ultrasound dated 03/12/2012.  Findings: Normal hepatic uptake.  Small bowel is visible within 20 minutes.  Gallbladder is visible within 30 minutes, confirming cystic duct patency.  IMPRESSION: Normal hepatobiliary nuclear medicine scan.  Original Report Authenticated By: Julian Hy, M.D.   [place] Chest  03/28/2012  *  RADIOLOGY REPORT*  Clinical Data: 46 year old male with pneumonia and pleural effusions on chest CT.  Assess for thoracentesis.  CHEST ULTRASOUND  Comparison: Chest CT without contrast 03/28/2012 and earlier.  Findings: Gray-scale imaging of the right hemithorax demonstrates a small component of pleural fluid, with a larger adjacent component of consolidated lung.  No free fluid seen subjacent to the right hemidiaphragm.  The quantity of pleural fluid identified is insufficient for thoracentesis.  IMPRESSION: Right pleural effusion and larger component of right lung consolidation on ultrasound.  Pleural fluid component quantity insufficient for thoracentesis at this time.  Original Report Authenticated By: Randall An, M.D.   US Abdomen Complete  03/28/2012  *RADIOLOGY REPORT*  Clinical Data:  Elevated LFTs.  Right upper quadrant pain and nausea.  ABDOMINAL ULTRASOUND COMPLETE  Comparison:  03/12/2012  Findings:  Gallbladder:  No  gallstones, gallbladder wall thickening, or pericholecystic fluid. Negative sonographic Murphy's sign reported by the sonographer.  Common Bile Duct:  Within normal limits in caliber.  Liver: No focal mass lesion identified.  Within normal limits in parenchymal echogenicity.  IVC:  Appears normal.  Pancreas:  No abnormality identified.  Spleen:  Within normal limits in size and echotexture.  Right kidney:  Normal in size and parenchymal echogenicity.  No evidence of mass or hydronephrosis.  Left kidney:  Normal in size and parenchymal echogenicity.  No evidence of mass or hydronephrosis.  Abdominal Aorta:  No aneurysm identified.  Other findings:  Small bilateral pleural effusions.  Small amount of upper abdominal ascites posterior to the liver and spleen.  IMPRESSION:  1.  Small amount of abdominal ascites. 2.  Small pleural effusions.  Original Report Authenticated By: Angelita Ingles, M.D.   US Abdomen Complete  03/12/2012  *RADIOLOGY REPORT*  Clinical Data:  Right upper quadrant abdominal pain, elevated liver function tests  ABDOMINAL ULTRASOUND COMPLETE  Comparison:  Chest CT 03/11/2012 with incomplete visualization of the liver  Findings:  Gallbladder:  Minimal dependent sludge is noted within the gallbladder.  The gallbladder is not distended, with gallbladder wall thickness measuring borderline thickened at 4 mm.  No shadowing calculus or sonographic Murphy's sign noted.  Common Bile Duct:  Within normal limits in caliber.  Liver: No focal mass lesion identified.  Within normal limits in parenchymal echogenicity.  IVC:  Appears normal.  Pancreas:  No abnormality identified.  Spleen:  Within normal limits in size and echotexture.  Right kidney:  Normal in size and parenchymal echogenicity.  No evidence of mass or hydronephrosis.  Left kidney:  Normal in size and parenchymal echogenicity.  No evidence of mass or hydronephrosis.  Abdominal Aorta:  Small amount of ascites is noted.  Trace left pleural  effusion.  IMPRESSION: Minimal sludge within the gallbladder with borderline wall thickening.  This could indicate cholecystitis in the appropriate clinical context.  Trace ascites.  Original Report Authenticated By: Arline Asp, M.D.   Ct Maxillofacial W/cm  04/01/2012  *RADIOLOGY REPORT*  Clinical Data: Swelling on the right for several weeks.  Jaw pain.  CT MAXILLOFACIAL WITH CONTRAST  Technique:  Multidetector CT imaging of the maxillofacial structures was performed with intravenous contrast. Multiplanar CT image reconstructions were also generated.  Contrast: 32mL OMNIPAQUE IOHEXOL 300 MG/ML  SOLN  Comparison: None.  Findings: In the right buccal region, there is a complex fluid collection spanning over 5 x 2.1 x 1.1 cm.  This is immediately adjacent to the significant dental disease.  Mild cervical spondylotic changes.  Visualized mastoid air  cells, middle ear cavities and paranasal sinuses are clear.  IMPRESSION: Probable abscess right buccal region most likely dental in origin given the degree of significant caries and periapical lucency of the molars.  Results called to Bari Mantis patient's nurse 04/01/2012 3:20 a.m. with request to call report first in the morning.  Original Report Authenticated By: Doug Sou, M.D.   Dg Chest Port 1 View  03/31/2012  *RADIOLOGY REPORT*  Clinical Data: Assess infiltrate.  PORTABLE CHEST - 1 VIEW  Comparison: 03/28/2012  Findings: Very low lung volumes.  Worsening bibasilar opacities and probable edema.  Bilateral effusions.  Cardiomegaly.  IMPRESSION: Increasing pulmonary edema, bibasilar opacities and effusions. Very low lung volumes.  Original Report Authenticated By: Raelyn Number, M.D.    Scheduled Meds:    . albuterol  2.5 mg Nebulization Q6H  . amLODipine  5 mg Oral Daily  . atorvastatin  20 mg Oral q1800  . docusate sodium  100 mg Oral BID  . ferrous sulfate  325 mg Oral BID WC  . furosemide  20 mg Intravenous Once  . guaiFENesin  1,200  mg Oral BID  . insulin aspart  0-9 Units Subcutaneous TID WC  . insulin aspart  13 Units Subcutaneous TID AC  . insulin glargine  40 Units Subcutaneous QHS  . lidocaine  2 patch Transdermal Q24H  . lidocaine      . linezolid  600 mg Oral Q12H  . metoprolol tartrate  50 mg Oral BID  . metronidazole  500 mg Intravenous Q8H  . senna  1 tablet Oral BID  . sodium chloride  3 mL Intravenous Q12H  . DISCONTD: furosemide  40 mg Oral Daily  . DISCONTD: piperacillin-tazobactam (ZOSYN)  IV  3.375 g Intravenous Q8H  . DISCONTD: vancomycin  1,000 mg Intravenous Q8H   Continuous Infusions:    . sodium chloride     PRN Meds:.sodium chloride, acetaminophen, acetaminophen, HYDROcodone-acetaminophen, morphine, ondansetron (ZOFRAN) IV, ondansetron, oxyCODONE, sodium chloride  Assessment & Plan   Brief narrative:  46 year old man recently treated for streptococcus pneumoniae bacteremia and pneumonia. Presented with cough, right-sided chest pain.    Chart review:  03/11/2012-03/16/2012 hospitalization: Streptococcus pneumoniae bacteremia, pneumonia, acute renal failure, elevated LFTs.   Past medical history:  Diabetes mellitus    Consultations:  Pulmonology Gastroenterology ENT  Physical therapy: Home health   Procedures:  April 9: Upper endoscopy: LA grade C esophagitis. Protonix 40 mg by mouth twice a day- Dr Benson [place] April 10: Colonoscopy - Dr Collene Mares April 11: I&D of Rt Parotid Abscess - Dr Hoyt Koch   Antibiotics:  April 6: Vancomycin  April 6: Zosyn ABX on 12th switched to Zyvox-Levaquin-Flagyl      #1. 46 y.o. male admitted on 03/27/2012, with recurrent pneumonia along with pleural effusion concerning for empyema, recent admission for Streptococcus pneumoniae bacteremia and was discharged on IV Rocephin, now comes back with fever chills. ID now changed ABX to Zyvox-Lavaquin and Falgyl due to ? Marrow suppression and ARF on 12th, D/W Dr Wendie Agreste, will monitor. Pulm saw pt again not  enough Pl fluid to Tap-ABX continue.     #2.Mixed pattern elevated LFTs: Etiology not clear. Abdominal ultrasound unremarkable on the 6th, hepatitis panel negative and recent HIDA scan was negative. ?? Possibly related to effusion. Has bumped up again on 12th, have requested GI to see, repeat US and Echo-Hep congestion.     #3. Hyponatremia: Stable. Likely related to current pneumonia/pulmonary process.      #4. Anemia: No  history of bleeding. Fecal occult blood negative. No previous data prior to last hospitalization when he presented with hemoglobin of 10 which quickly decreased, probably from dilution also could be a medication effect. His anemia panel does show severely low iron although ferritin is stable (however it is a acute phase reactant) . Appreciate gastroenterology evaluation, EGD showing grade C. esophagitis placed on PPI, limited colonoscopy also unremarkable, stable post 2 units on 11th, ? Marrow suppression from [place] will monitor.    #5. Diabetes mellitus type 2, uncontrolled: Continue sliding scale insulin, Lantus, meal coverage increased.  CBG (last 3)   Basename 04/03/12 1122 04/03/12 0641 04/02/12 2153  GLUCAP 94 220* 166*      #6. Right buccal mucosa mass: This developed during his last hospitalization. Was seen by ENT and oral surgery, underwent I&D of a right parotid abscess by Dr. Hoyt Koch on 04/02/2012, antibiotics as #1 above.  .   #7. Acute renal failure with edema - renal failure worse today along with LFTs, USm Renal lytes, UA, hold lasix, gentle IVF, DC Vanco.    DVT Prophylaxis  SCDs      Thurnell Lose M.D on 04/03/2012 at 11:52 AM  [place]  [phone]

## 2012-04-03 NOTE — Progress Notes (Signed)
04/03/2012 Jacqualyn Posey PTA [phone] pager [phone] office

## 2012-04-03 NOTE — Progress Notes (Signed)
Patient ID: [name] [name], male   DOB: 03-Apr-1966, 46 y.o.   MRN: [id number] Subjective: No acute events.  Objective: Vital signs in last 24 hours: Temp:  [97 F (36.1 C)-99.4 F (37.4 C)] 99 F (37.2 C) (04/12 1323) Pulse Rate:  [70-96] 81  (04/12 1323) Resp:  [18-20] 18  (04/12 1323) BP: (133-165)/(81-101) 149/91 mmHg (04/12 1323) SpO2:  [90 %-93 %] 92 % (04/12 1323) Weight:  [90.084 kg (198 lb 9.6 oz)] 90.084 kg (198 lb 9.6 oz) (04/12 0500) Last BM Date: 04/01/12  Intake/Output from previous day: 04/11 0701 - 04/12 0700 In: 1931 [P.O.:1278; I.V.:3; Blood:350; IV Piggyback:300] Out: 1800 [Urine:1800] Intake/Output this shift: Total I/O In: -  Out: 700 [Urine:700]  General appearance: alert and no distress GI: soft, non-tender; bowel sounds normal; no masses,  no organomegaly  Lab Results:  Basename 04/03/12 0953 04/02/12 0605 04/01/12 0630  WBC -- 11.0* 9.1  HGB 8.5* 7.6* 8.2*  HCT 26.4* 23.2* 25.0*  PLT -- 271 313   BMET  Basename 04/03/12 1120 04/03/12 0630 04/01/12 0630  NA 137 132* 136  K 4.1 4.1 3.5  CL 97 95* 98  CO2 29 28 30   GLUCOSE 88 240* 99  BUN 27* 27* 18  CREATININE 1.54* 1.66* 0.91  CALCIUM 8.7 8.5 8.7   LFT  Basename 04/03/12 1120  PROT 6.5  ALBUMIN 1.9*  AST 132*  ALT 141*  ALKPHOS PENDING  BILITOT 0.6  BILIDIR --  IBILI --   PT/INR No results found for this basename: LABPROT:2,INR:2 in the last 72 hours Hepatitis Panel No results found for this basename: HEPBSAG,HCVAB,HEPAIGM,HEPBIGM in the last 72 hours C-Diff No results found for this basename: CDIFFTOX:3 in the last 72 hours Fecal Lactopherrin No results found for this basename: FECLLACTOFRN in the last 72 hours  Studies/Results: No results found.  Medications:  Scheduled:   . albuterol  2.5 mg Nebulization Q6H  . amLODipine  5 mg Oral Daily  . atorvastatin  20 mg Oral q1800  . docusate sodium  100 mg Oral BID  . ferrous sulfate  325 mg Oral BID WC  .  guaiFENesin  1,200 mg Oral BID  . insulin aspart  0-9 Units Subcutaneous TID WC  . insulin aspart  13 Units Subcutaneous TID AC  . insulin glargine  40 Units Subcutaneous QHS  . levofloxacin (LEVAQUIN) IV  750 mg Intravenous Q24H  . lidocaine  2 patch Transdermal Q24H  . linezolid  600 mg Oral Q12H  . metoprolol tartrate  50 mg Oral BID  . metronidazole  500 mg Intravenous Q8H  . senna  1 tablet Oral BID  . sodium chloride  3 mL Intravenous Q12H  . DISCONTD: furosemide  40 mg Oral Daily  . DISCONTD: piperacillin-tazobactam (ZOSYN)  IV  3.375 g Intravenous Q8H  . DISCONTD: vancomycin  1,000 mg Intravenous Q8H   Continuous:   . sodium chloride      Assessment/Plan: 1) Elevated AP. 2) LA Grade C esophagitis. 3) Pneumonia. 4) Increased creatinine.   I am uncertain about the cause of the elevated AP.  This has been noted even since his initial hospitalization.  No clear evidence of CHF.  I checked him for hepatojugular reflux, but it was negative and his lung fields are essentially clear.  The RUQ U/S during this admission was negative for any biliary ductal dilation.  He is on for a follow up ultrasound today.  Plan: 1) Check AMA, ASMA, ACE level, ANA, alpha-1-antitrypsin.  LOS: 7 days   Elzie Knisley D 04/03/2012, 1:42 PM

## 2012-04-03 NOTE — Progress Notes (Signed)
  Echocardiogram 2D Echocardiogram has been performed.  [name] [name] 04/03/2012, 5:03 PM

## 2012-04-04 ENCOUNTER — Inpatient Hospital Stay (HOSPITAL_COMMUNITY): Payer: Managed Care, Other (non HMO)

## 2012-04-04 DIAGNOSIS — R7989 Other specified abnormal findings of blood chemistry: Secondary | ICD-10-CM

## 2012-04-04 DIAGNOSIS — R945 Abnormal results of liver function studies: Secondary | ICD-10-CM

## 2012-04-04 DIAGNOSIS — N289 Disorder of kidney and ureter, unspecified: Secondary | ICD-10-CM

## 2012-04-04 DIAGNOSIS — D649 Anemia, unspecified: Secondary | ICD-10-CM

## 2012-04-04 DIAGNOSIS — R7402 Elevation of levels of lactic acid dehydrogenase (LDH): Secondary | ICD-10-CM

## 2012-04-04 DIAGNOSIS — R7401 Elevation of levels of liver transaminase levels: Secondary | ICD-10-CM

## 2012-04-04 LAB — GLUCOSE, CAPILLARY
Glucose-Capillary: 116 mg/dL — ABNORMAL HIGH (ref 70–99)
Glucose-Capillary: 82 mg/dL (ref 70–99)
Glucose-Capillary: 101 mg/dL — ABNORMAL HIGH (ref 70–99)
Glucose-Capillary: 83 mg/dL (ref 70–99)
Glucose-Capillary: 67 mg/dL — ABNORMAL LOW (ref 70–99)
Glucose-Capillary: 72 mg/dL (ref 70–99)

## 2012-04-04 LAB — DIRECT ANTIGLOBULIN TEST (NOT AT ARMC)
DAT, IgG: NEGATIVE
DAT, complement: NEGATIVE

## 2012-04-04 LAB — COMPREHENSIVE METABOLIC PANEL
ALT: 101 U/L — ABNORMAL HIGH (ref 0–53)
AST: 69 U/L — ABNORMAL HIGH (ref 0–37)
Albumin: 1.8 g/dL — ABNORMAL LOW (ref 3.5–5.2)
Alkaline Phosphatase: 1151 U/L — ABNORMAL HIGH (ref 39–117)
BUN: 25 mg/dL — ABNORMAL HIGH (ref 6–23)
CO2: 29 mEq/L (ref 19–32)
Calcium: 8.4 mg/dL (ref 8.4–10.5)
Chloride: 102 mEq/L (ref 96–112)
Creatinine, Ser: 1.6 mg/dL — ABNORMAL HIGH (ref 0.50–1.35)
GFR calc Af Amer: 59 mL/min — ABNORMAL LOW (ref >90)
GFR calc non Af Amer: 51 mL/min — ABNORMAL LOW (ref >90)
Glucose, Bld: 116 mg/dL — ABNORMAL HIGH (ref 70–99)
Potassium: 3.9 mEq/L (ref 3.5–5.1)
Sodium: 139 mEq/L (ref 135–145)
Total Bilirubin: 0.4 mg/dL (ref 0.3–1.2)
Total Protein: 6.5 g/dL (ref 6.0–8.3)

## 2012-04-04 LAB — CBC
HCT: 23.2 % — ABNORMAL LOW (ref 39.0–52.0)
Hemoglobin: 7.6 g/dL — ABNORMAL LOW (ref 13.0–17.0)
MCH: 28.6 pg (ref 26.0–34.0)
MCHC: 32.8 g/dL (ref 30.0–36.0)
MCV: 87.2 fL (ref 78.0–100.0)
Platelets: 261 10*3/uL (ref 150–400)
RBC: 2.66 MIL/uL — ABNORMAL LOW (ref 4.22–5.81)
RDW: 14.6 % (ref 11.5–15.5)
WBC: 7.3 10*3/uL (ref 4.0–10.5)

## 2012-04-04 LAB — VITAMIN B12: Vitamin B-12: 679 pg/mL (ref 211–911)

## 2012-04-04 LAB — WOUND CULTURE: Culture: NO GROWTH

## 2012-04-04 LAB — RETICULOCYTES
RBC.: 2.95 MIL/uL — ABNORMAL LOW (ref 4.22–5.81)
Retic Count, Absolute: 50.2 10*3/uL (ref 19.0–186.0)
Retic Ct Pct: 1.7 % (ref 0.4–3.1)

## 2012-04-04 LAB — IRON AND TIBC
Iron: 19 ug/dL — ABNORMAL LOW (ref 42–135)
Saturation Ratios: 10 % — ABNORMAL LOW (ref 20–55)
TIBC: 195 ug/dL — ABNORMAL LOW (ref 215–435)
UIBC: 176 ug/dL (ref 125–400)

## 2012-04-04 LAB — LACTATE DEHYDROGENASE: LDH: 180 U/L (ref 94–250)

## 2012-04-04 LAB — FERRITIN: Ferritin: 1235 ng/mL — ABNORMAL HIGH (ref 22–322)

## 2012-04-04 LAB — HAPTOGLOBIN: Haptoglobin: 409 mg/dL — ABNORMAL HIGH (ref 45–215)

## 2012-04-04 LAB — FOLATE: Folate: 13.9 ng/mL

## 2012-04-04 LAB — ANGIOTENSIN CONVERTING ENZYME: Angiotensin-Converting Enzyme: 32 U/L (ref 8–52)

## 2012-04-04 MED ORDER — INSULIN ASPART 100 UNIT/ML ~~LOC~~ SOLN
9.0000 [IU] | Freq: Three times a day (TID) | SUBCUTANEOUS | Status: DC
  Administered 2012-04-04: 9 [IU] via SUBCUTANEOUS

## 2012-04-04 MED ORDER — IOHEXOL 300 MG/ML  SOLN
20.0000 mL | INTRAMUSCULAR | Status: AC
  Administered 2012-04-04 (×2): 20 mL via ORAL

## 2012-04-04 MED ORDER — SODIUM CHLORIDE 0.9 % IV SOLN
25.0000 mg | Freq: Once | INTRAVENOUS | Status: AC
  Administered 2012-04-04: 25 mg via INTRAVENOUS
  Filled 2012-04-04: qty 0.5

## 2012-04-04 MED ORDER — SODIUM CHLORIDE 0.9 % IV SOLN
125.0000 mg | Freq: Once | INTRAVENOUS | Status: DC
  Filled 2012-04-04: qty 10

## 2012-04-04 MED ORDER — DIPHENHYDRAMINE HCL 25 MG PO CAPS
25.0000 mg | ORAL_CAPSULE | Freq: Once | ORAL | Status: AC
  Administered 2012-04-04: 25 mg via ORAL
  Filled 2012-04-04: qty 1

## 2012-04-04 MED ORDER — SODIUM CHLORIDE 0.9 % IV SOLN
1500.0000 mg | Freq: Once | INTRAVENOUS | Status: AC
  Administered 2012-04-04: 1500 mg via INTRAVENOUS
  Filled 2012-04-04 (×2): qty 30

## 2012-04-04 MED ORDER — SODIUM CHLORIDE 0.9 % IV SOLN
25.0000 mg | Freq: Once | INTRAVENOUS | Status: DC
  Filled 2012-04-04: qty 0.5

## 2012-04-04 MED ORDER — INSULIN GLARGINE 100 UNIT/ML ~~LOC~~ SOLN
35.0000 [IU] | Freq: Every day | SUBCUTANEOUS | Status: DC
  Administered 2012-04-04: 35 [IU] via SUBCUTANEOUS

## 2012-04-04 MED ORDER — SODIUM CHLORIDE 0.9 % IV SOLN
1500.0000 mg | Freq: Once | INTRAVENOUS | Status: DC
  Filled 2012-04-04: qty 30

## 2012-04-04 MED ORDER — SODIUM CHLORIDE 0.9 % IV SOLN
INTRAVENOUS | Status: AC
  Administered 2012-04-04: 09:00:00 via INTRAVENOUS

## 2012-04-04 NOTE — Progress Notes (Signed)
[facility] Progress Note  Subjective: Feels better. No GI complaints.  Objective:  Vital signs in last 24 hours: Temp:  [98 F (36.7 C)-99 F (37.2 C)] 98 F (36.7 C) (04/13 0600) Pulse Rate:  [81-91] 86  (04/13 0600) Resp:  [18] 18  (04/13 0600) BP: (133-149)/(80-91) 133/85 mmHg (04/13 0600) SpO2:  [88 %-92 %] 92 % (04/13 0924) Weight:  [201 lb 4.5 oz (91.3 kg)] 201 lb 4.5 oz (91.3 kg) (04/13 0500) Last BM Date: 04/01/12 General:   Alert,  Well-developed,  white male in NAD Heart:  Regular rate and rhythm; no murmurs Abdomen:  Soft, nontender and nondistended. Normal bowel sounds, without guarding, and without rebound.   Extremities:  Without edema. Neurologic:  Alert and  oriented x4;  grossly normal neurologically. Psych:  Alert and cooperative. Normal mood and affect.  Intake/Output from previous day: 04/12 0701 - 04/13 0700 In: 240 [P.O.:240] Out: 700 [Urine:700] Intake/Output this shift:    Lab Results:  Basename 04/04/12 0720 04/03/12 0953 04/02/12 0605  WBC 7.3 -- 11.0*  HGB 7.6* 8.5* 7.6*  HCT 23.2* 26.4* 23.2*  PLT 261 -- 271   BMET  Basename 04/04/12 0720 04/03/12 1120 04/03/12 0630  NA 139 137 132*  K 3.9 4.1 4.1  CL 102 97 95*  CO2 29 29 28   GLUCOSE 116* 88 240*  BUN 25* 27* 27*  CREATININE 1.60* 1.54* 1.66*  CALCIUM 8.4 8.7 8.5   LFT  Basename 04/04/12 0720  PROT 6.5  ALBUMIN 1.8*  AST 69*  ALT 101*  ALKPHOS 1151*  BILITOT 0.4  BILIDIR --  IBILI --   Studies/Results: US Abdomen Complete  04/03/2012  *RADIOLOGY REPORT*  Clinical Data:  Elevated liver enzymes.  Acute renal failure.  COMPLETE ABDOMINAL ULTRASOUND  Comparison:  HIDA 03/14/2012, ultrasound 03/12/2012  Findings:  Gallbladder:  There is layering sludge within the gallbladder.  The gallbladder wall is upper limits of normal 3 mm.  No pericholecystic fluid.  Negative sonographic Murphy's sign.  Common bile duct:  Normal at 4 mm  Liver:  No focal lesion identified.   Within normal limits in parenchymal echogenicity.  IVC:  Appears normal.  Pancreas:  No focal abnormality seen.  Spleen:  Normal size and echogenicity.  Right Kidney:  12.2cm in length.  No evidence of hydronephrosis or stones.  Left Kidney:  12.9cm in length.  No evidence of hydronephrosis or stones.  Abdominal aorta:  No aneurysm identified.  IMPRESSION:  1.  Small sludge within the gallbladder is similar to comparison exam. 2.  Liver parenchyma appears normal without evidence of duct dilatation.  Of note on the prior HIDA  scan (03/14/2012),  there is a delayed clearance of radiotracer from the liver suggesting cholestasis or hepatitis.  Original Report Authenticated By: Suzy Bouchard, M.D.    Assessment / Plan:  1) Elevated AP, intrahepatic cholestasis. US shows GB sludge, no biliary dilation. Serologies so far are negative, several are pending.  2) LA Grade C esophagitis. Continue PPI. 3) Pneumonia.  4) Increased creatinine. 5) Anemia.  Principal Problem:  *Community acquired pneumonia Active Problems:  Diabetes mellitus  Acute kidney failure  Elevated LFTs  Bacteremia due to Streptococcus pneumoniae  Hyponatremia  Bilateral leg edema  Cyst of skin  Parapneumonic effusion  Anemia  Hypoalbuminemia  Total bilirubin, elevated    LOS: 8 days   Aslan Montagna T. Fuller Plan MD [facility] 04/04/2012, 11:22 AM

## 2012-04-04 NOTE — Consult Note (Signed)
[name] [name] [id number] August 17, 1966 46 y.o. 04/04/2012 11:15 AM   Referring Physician: Dr Mamie Nick. [name] [name]  Reason For Consult:  Anemia  CC:  Dr. [name]  Findings:  46 year old man admitted with streptococcus  pneumonia and bacteremia and parotitis.  Patient has seen several consults.  He was also found to have anemia -  H/H are as follows :-  4/13.13  7.6/23.2 04/02/12  7.6/23.2 03/30/12    6.9/20.8 03/29/12    7.4/22.2 03/28/12    7.0/21.2  Patient has received 3 units of PRBC so far.  Last unit given on 04/02/12  Patient denies history of over blood loss.  He denies history of weight loss prior to admission.  Denies family history for anemia.   GI evaluation has included an EGD and colonoscopy performed by Dr. Saralyn Pilar hung on 4/9 and 4/10 - colonoscopy was unremarkable.  EGD revealed mild esophagitis.     Past Medical History:  Past Medical History  Diagnosis Date  . Diabetes mellitus   . Pneumonia 02/2012    Strep pneumoniae bilateral pneumonia complicated by bacteremia  . Bacteremia     Past Surgical History  Procedure Date  . Esophagogastroduodenoscopy 03/31/2012    Procedure: ESOPHAGOGASTRODUODENOSCOPY (EGD);  Surgeon: Beryle Beams, MD;  Location: [facility] ENDOSCOPY;  Service: Endoscopy;  Laterality: N/A;  . Colonoscopy 04/01/2012    Procedure: COLONOSCOPY;  Surgeon: Juanita Craver, MD;  Location: [facility] ENDOSCOPY;  Service: Endoscopy;  Laterality: N/A;    Allergies:  Allergies  Allergen Reactions  . Penicillins Rash    Tolerating Ceftriaxone (03/12/12)    Medications:  Scheduled:   . albuterol  2.5 mg Nebulization Q6H  . amLODipine  5 mg Oral Daily  . atorvastatin  20 mg Oral q1800  . dextrose      . docusate sodium  100 mg Oral BID  . ferrous sulfate  325 mg Oral BID WC  . guaiFENesin  1,200 mg Oral BID  . insulin aspart  0-9 Units Subcutaneous TID WC  . insulin aspart  13 Units Subcutaneous TID AC  . insulin glargine  40 Units Subcutaneous QHS  . levofloxacin  (LEVAQUIN) IV  750 mg Intravenous Q24H  . lidocaine  2 patch Transdermal Q24H  . linezolid  600 mg Oral Q12H  . metoprolol tartrate  50 mg Oral BID  . metronidazole  500 mg Intravenous Q8H  . senna  1 tablet Oral BID  . sodium chloride  3 mL Intravenous Q12H  . DISCONTD: piperacillin-tazobactam (ZOSYN)  IV  3.375 g Intravenous Q8H   Continuous:   . sodium chloride 100 mL/hr (04/03/12 1839)  . sodium chloride 75 mL/hr at 04/04/12 0900   [id number] chloride, acetaminophen, acetaminophen, HYDROcodone-acetaminophen, morphine, ondansetron (ZOFRAN) IV, ondansetron, oxyCODONE, sodium chloride  Social History:  Denies tobacco use. Denies alcohol use.  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  Divorced with a 23 year old daughter. His aunt, Lorna Dibble is a retired Marine scientist in [place].    Family History:  Negative for anemia and malignancy.   Review of Systems: Constitutional ROS: Fever, Chills, Night Sweats, Anorexia, Pain  Cardiovascular ROS: no chest pain or dyspnea on exertion Respiratory ROS: has a chronic cough with shortness of breath on exertion and wheeze Neurological ROS: no TIA or stroke symptoms Dermatological ROS: Denies rash or petechiae Gastrointestinal ROS: no abdominal pain, change in bowel habits, or black or bloody stools Genito-Urinary ROS: no dysuria, trouble voiding, or hematuria Hematological and Lymphatic ROS: negative Musculoskeletal ROS: negative  Remaining ROS negative.   Physical Exam: Blood pressure 133/85, pulse 86, temperature 98 F (36.7 C), temperature source Oral, resp. rate 18, height 6\' 2"  (1.88 m), weight 201 lb 4.5 oz (91.3 kg), SpO2 92.00%.  General appearance: alert and cooperative Resp: wheezes bilaterally Cardio: regular rate and rhythm, S1, S2 normal, no murmur, click, rub or gallop GI: soft, non-tender; bowel sounds normal; no masses,  no organomegaly Extremities: extremities normal, atraumatic, no cyanosis or edema Skin: No petechiea   Lab  Results: LABS:  CBC    Component Value Date/Time   WBC 7.3 04/04/2012 0720   RBC 2.66* 04/04/2012 0720   HGB 7.6* 04/04/2012 0720   HCT 23.2* 04/04/2012 0720   PLT 261 04/04/2012 0720   MCV 87.2 04/04/2012 0720   MCH 28.6 04/04/2012 0720   MCHC 32.8 04/04/2012 0720   RDW 14.6 04/04/2012 0720     Basename 04/04/12 0720 04/03/12 1120  NA 139 137  K 3.9 4.1  CL 102 97  CO2 29 29  GLUCOSE 116* 88  BUN 25* 27*  CREATININE 1.60* 1.54*  CALCIUM 8.4 8.7   Retic count - 5.6, Ferritn 906, iron <10, Folate 12.  Radiological Studies: US Abdomen Complete  04/03/2012  Findings:  Gallbladder:  There is layering sludge within the gallbladder.  The gallbladder wall is upper limits of normal 3 mm.  No pericholecystic fluid.  Negative sonographic Murphy's sign.  Common bile duct:  Normal at 4 mm  Liver:  No focal lesion identified.  Within normal limits in parenchymal echogenicity.  IVC:  Appears normal.  Pancreas:  No focal abnormality seen.  Spleen:  Normal size and echogenicity.  Right Kidney:  12.2cm in length.  No evidence of hydronephrosis or stones.  Left Kidney:  12.9cm in length.  No evidence of hydronephrosis or stones.  Abdominal aorta:  No aneurysm identified.  IMPRESSION:  1.  Small sludge within the gallbladder is similar to comparison exam. 2.  Liver parenchyma appears normal without evidence of duct dilatation.  Of note on the prior HIDA  scan (03/14/2012),  there is a delayed clearance of radiotracer from the liver suggesting cholestasis or hepatitis.    Impression and Plan: 1.  Persistent anemia - multifactorial. Acute on chronic illness, nutritional, diabetes etc.  Obtain smear and reticulocyte count. Rule out hemolysis from recent infection.  Repeat anemia panel.  Rule out myelodyscrasia.  Obtain erythropoetin level.  Limit blood draws. Would not transfuse unless hg <7.0 or if patient becomes symptomatic.  Although ferritin elevated (acute phase reactant), iron level low.  Would also  recommend IV iron x 1 dose. 2. Renal insufficiency 3. Abnormal LFT's -  Hepatitis and HIV negative.  W/U in progress.  Consider CT of abdomen ( non contrast as creatinine elevated).   Will follow results with you.   Sophronia Simas I., MD 04/04/2012

## 2012-04-04 NOTE — Progress Notes (Signed)
[name] [name] CSN:[id number],MRN:[id number] is a 46 y.o. male,  Outpatient Primary MD for the patient is [name] Logan, MD, MD  Chief Complaint  Patient presents with  . Emesis        Subjective:    [name] today has, No headache, No chest pain, No abdominal pain - No Nausea, No new weakness tingling or numbness, No Cough - SOB.    Objective:   Filed Vitals:   04/04/12 0500 04/04/12 0600 04/04/12 0917 04/04/12 0924  BP:  133/85    Pulse:  86    Temp:  98 F (36.7 C)    TempSrc:      Resp:  18    Height:      Weight: 91.3 kg (201 lb 4.5 oz)     SpO2:  89% 89% 92%    Wt Readings from Last 3 Encounters:  04/04/12 91.3 kg (201 lb 4.5 oz)  04/04/12 91.3 kg (201 lb 4.5 oz)  04/04/12 91.3 kg (201 lb 4.5 oz)    No intake or output data in the 24 hours ending 04/04/12 1331  Exam Awake Alert, Oriented *3, No new F.N deficits, Normal affect [place].AT,PERRAL. Right-sided facial swelling. Supple Neck,No JVD, No cervical lymphadenopathy appriciated.  Symmetrical Chest wall movement, Good air movement bilaterally, CTAB RRR,No Gallops,Rubs or new Murmurs, No Parasternal Heave +ve B.Sounds, Abd Soft, Non tender, No organomegaly appriciated, No rebound -guarding or rigidity. No Cyanosis, Clubbing , 1+edema, No new Rash or bruise      Data Review   CBC  Lab 04/04/12 0720 04/03/12 0953 04/02/12 0605 04/01/12 0630 03/31/12 0500 03/30/12 0530  WBC 7.3 -- 11.0* 9.1 10.1 10.6*  HGB 7.6* 8.5* 7.6* 8.2* 8.0* --  HCT 23.2* 26.4* 23.2* 25.0* 23.8* --  PLT 261 -- 271 313 281 299  MCV 87.2 -- 87.9 87.4 85.6 88.9  MCH 28.6 -- 28.8 28.7 28.8 29.5  MCHC 32.8 -- 32.8 32.8 33.6 33.2  RDW 14.6 -- 15.1 15.4 15.7* 15.1  LYMPHSABS -- -- -- -- -- --  MONOABS -- -- -- -- -- --  EOSABS -- -- -- -- -- --  BASOSABS -- -- -- -- -- --  BANDABS -- -- -- -- -- --    Chemistries   Lab 04/04/12 0720 04/03/12 1120 04/03/12 0630 04/01/12 0630 03/31/12 0500  NA 139 137 132* 136 130*  K  3.9 4.1 4.1 3.5 4.2  CL 102 97 95* 98 95*  CO2 29 29 28 30 25   GLUCOSE 116* 88 240* 99 250*  BUN 25* 27* 27* 18 31*  CREATININE 1.60* 1.54* 1.66* 0.91 1.10  CALCIUM 8.4 8.7 8.5 8.7 8.7  MG -- -- -- -- 1.9  AST 69* 132* 161* 46* 103*  ALT 101* 141* 128* 91* 127*  ALKPHOS 1151* 1491* 1356* 714* 792*  BILITOT 0.4 0.6 0.6 0.5 0.6   ------------------------------------------------------------------------------------------------------------------ estimated creatinine clearance is 67.8 ml/min (by C-G formula based on Cr of 1.6). ------------------------------------------------------------------------------------------------------------------ No results found for this basename: HGBA1C:2 in the last 72 hours ------------------------------------------------------------------------------------------------------------------ No results found for this basename: CHOL:2,HDL:2,LDLCALC:2,TRIG:2,CHOLHDL:2,LDLDIRECT:2 in the last 72 hours ------------------------------------------------------------------------------------------------------------------ No results found for this basename: TSH,T4TOTAL,FREET3,T3FREE,THYROIDAB in the last 72 hours ------------------------------------------------------------------------------------------------------------------  [facility] 04/04/12 1210  VITAMINB12 --  FOLATE --  FERRITIN --  TIBC --  IRON --  RETICCTPCT 1.7    Coagulation profile No results found for this basename: INR:5,PROTIME:5 in the last 168 hours  No results found for this basename: DDIMER:2 in the last 72 hours  Cardiac  Enzymes No results found for this basename: CK:3,CKMB:3,TROPONINI:3,MYOGLOBIN:3 in the last 168 hours ------------------------------------------------------------------------------------------------------------------ No components found with this basename: POCBNP:3  Micro Results Recent Results (from the past 240 hour(s))  CULTURE, BLOOD (ROUTINE X 2)     Status: Normal    Collection Time   03/28/12 12:15 AM      Component Value Range Status Comment   Specimen Description BLOOD LEFT ARM   Final    Special Requests BOTTLES DRAWN AEROBIC AND ANAEROBIC 10CC   Final    Culture  Setup Time [id number]   Final    Culture NO GROWTH 5 DAYS   Final    Report Status 04/03/2012 FINAL   Final   CULTURE, BLOOD (ROUTINE X 2)     Status: Normal   Collection Time   03/28/12 12:25 AM      Component Value Range Status Comment   Specimen Description BLOOD RIGHT ARM   Final    Special Requests BOTTLES DRAWN AEROBIC ONLY 10CC   Final    Culture  Setup Time [id number]   Final    Culture NO GROWTH 5 DAYS   Final    Report Status 04/03/2012 FINAL   Final   WOUND CULTURE     Status: Normal   Collection Time   04/02/12 12:30 PM      Component Value Range Status Comment   Specimen Description WOUND MOUTH RIGHT   Final    Special Requests ORAL MUCOSA   Final    Gram Stain     Final    Value: FEW WBC PRESENT, PREDOMINANTLY PMN     NO SQUAMOUS EPITHELIAL CELLS SEEN     NO ORGANISMS SEEN   Culture NO GROWTH 2 DAYS   Final    Report Status 04/04/2012 FINAL   Final     Radiology Reports Dg Chest 2 View  03/28/2012  *RADIOLOGY REPORT*  Clinical Data: Cough, right rib pain, emesis, recent pneumonia  CHEST - 2 VIEW  Comparison: 03/16/2012  Findings: Patchy right lower lobe opacity, suspicious for pneumonia, with associated small-to-moderate right pleural effusion.  Left basilar opacity, atelectasis versus pneumonia, with suspected small left pleural effusion.  No pneumothorax.  The heart is normal in size.  Interval removal of right subclavian PICC.  Visualized osseous structures are within normal limits.  IMPRESSION: Patchy right lower lobe opacity, suspicious for pneumonia, with associated small-to-moderate right pleural effusion.  Left basilar opacity, atelectasis versus pneumonia, with suspected small left pleural effusion.  Original Report Authenticated By: Julian Hy, M.D.    Dg Chest 2 View  03/16/2012  *RADIOLOGY REPORT*  Clinical Data: History of pneumonia.  CHEST - 2 VIEW  Comparison: 03/11/2012.  Findings: Tip of right PICC terminates in right atrial region.  No pneumothorax is seen.  There is some atelectasis and infiltrate in the medial posterior aspect of the left lung.  There are atelectasis and infiltrative densities seen within the right perihilar region and within the right midlung and right lower lung areas with consolidation and atelectasis.  There is improvement in the amount of infiltrate compared to previous study.   On the lateral image there is continued abnormal opacity inferiorly and posteriorly.  Indistinctness of costophrenic angles may reflect an element of pleural effusion.  IMPRESSION: Right PICC in place.  Some atelectasis and infiltrative density in the medial posterior left lung inferiorly. here are atelectasis and infiltrative densities seen within the right perihilar region and within the right midlung and right lower lung areas with  consolidation and atelectasis.  There is improvement in the amount of infiltrative compared to previous study but clearing has not occurred. Question small amounts of pleural effusion.  Original Report Authenticated By: Delane Ginger, M.D.   Dg Chest 2 View  03/11/2012  *RADIOLOGY REPORT*  Clinical Data: Shortness of breath with right lower chest pain and fever.  CHEST - 2 VIEW  Comparison: None.  Findings: Trachea is midline.  Heart size normal.  There is dense airspace consolidation in the right lower lobe.  Question mild left lower lobe air space disease.  No left pleural fluid.  IMPRESSION:  1.  Dense airspace consolidation in the right lower lobe is most consistent with pneumonia.  Follow-up to clearing is recommended, to exclude a centrally obstructing mass. 2.  Question mild left lower lobe air space disease.  Original Report Authenticated By: Luretha Rued, M.D.   Ct Chest Wo Contrast  03/28/2012  *RADIOLOGY  REPORT*  Clinical Data: Cough, right-sided chest pain  CT CHEST WITHOUT CONTRAST  Technique:  Multidetector CT imaging of the chest was performed following the standard protocol without IV contrast.  Comparison: Chest radiographs dated 03/28/2012.  CT chest dated 03/11/2012.  Findings: Patchy posterior right upper lobe opacity and near complete right lower lobe consolidation, suspicious for pneumonia. Associated moderate right pleural effusion.  Patchy left lower lobe opacity, atelectasis versus pneumonia, with associated small left pleural effusion.  No pneumothorax.  Heart is normal in size.  Small pericardial effusion.  Visualized upper abdomen is unremarkable.  Mild degenerative changes of the visualized thoracolumbar spine.  IMPRESSION: Right upper/lower lobe pneumonia, with associated moderate right pleural effusion.  Patchy left lower lobe opacity, atelectasis versus pneumonia, with associated small left pleural effusion.  Original Report Authenticated By: Julian Hy, M.D.   Ct Angio Chest W/cm &/or Wo Cm  03/11/2012  *RADIOLOGY REPORT*  Clinical Data: Fever.  Cough.  Weakness.  Short of breath.  CT ANGIOGRAPHY CHEST  Technique:  Multidetector CT imaging of the chest using the standard protocol during bolus administration of intravenous contrast. Multiplanar reconstructed images including MIPs were obtained and reviewed to evaluate the vascular anatomy.  Contrast: 49mL OMNIPAQUE IOHEXOL 350 MG/ML IV SOLN  Comparison: Chest radiography same day  Findings: Pulmonary arterial opacification is excellent.  There are no pulmonary emboli.  There is consolidative pneumonia throughout the right lower lobe with minor involvement of the right upper lobe.  There is consolidative pneumonia less extensively in the posterior aspect of the left lower lobe.  There is a small amount of pleural fluid on the right.  There is a tiny amount of pericardial fluid.  There are small mediastinal lymph nodes, likely reactive.   No upper abdominal pathology is seen.  IMPRESSION: No pulmonary emboli.  Consolidative pneumonia throughout the right lower lobe.  Partial involvement of the left lower lobe by consolidative  pneumonia. Minimal involvement of the right upper lobe.  Original Report Authenticated By: Jules Schick, M.D.   Nm Hepatobiliary  03/14/2012  *RADIOLOGY REPORT*  Clinical Data: Abdominal pain, evaluate for cholecystitis  NUCLEAR MEDICINE HEPATOHBILIARY INCLUDE GB  Radiopharmaceutical:  5.5 mCi technetium 35m Choletec  Comparison: Ultrasound dated 03/12/2012.  Findings: Normal hepatic uptake.  Small bowel is visible within 20 minutes.  Gallbladder is visible within 30 minutes, confirming cystic duct patency.  IMPRESSION: Normal hepatobiliary nuclear medicine scan.  Original Report Authenticated By: Julian Hy, M.D.   [place] Chest  03/28/2012  *RADIOLOGY REPORT*  Clinical Data: 46 year old male with pneumonia and  pleural effusions on chest CT.  Assess for thoracentesis.  CHEST ULTRASOUND  Comparison: Chest CT without contrast 03/28/2012 and earlier.  Findings: Gray-scale imaging of the right hemithorax demonstrates a small component of pleural fluid, with a larger adjacent component of consolidated lung.  No free fluid seen subjacent to the right hemidiaphragm.  The quantity of pleural fluid identified is insufficient for thoracentesis.  IMPRESSION: Right pleural effusion and larger component of right lung consolidation on ultrasound.  Pleural fluid component quantity insufficient for thoracentesis at this time.  Original Report Authenticated By: Randall An, M.D.   US Abdomen Complete  03/28/2012  *RADIOLOGY REPORT*  Clinical Data:  Elevated LFTs.  Right upper quadrant pain and nausea.  ABDOMINAL ULTRASOUND COMPLETE  Comparison:  03/12/2012  Findings:  Gallbladder:  No gallstones, gallbladder wall thickening, or pericholecystic fluid. Negative sonographic Murphy's sign reported by the sonographer.  Common Bile  Duct:  Within normal limits in caliber.  Liver: No focal mass lesion identified.  Within normal limits in parenchymal echogenicity.  IVC:  Appears normal.  Pancreas:  No abnormality identified.  Spleen:  Within normal limits in size and echotexture.  Right kidney:  Normal in size and parenchymal echogenicity.  No evidence of mass or hydronephrosis.  Left kidney:  Normal in size and parenchymal echogenicity.  No evidence of mass or hydronephrosis.  Abdominal Aorta:  No aneurysm identified.  Other findings:  Small bilateral pleural effusions.  Small amount of upper abdominal ascites posterior to the liver and spleen.  IMPRESSION:  1.  Small amount of abdominal ascites. 2.  Small pleural effusions.  Original Report Authenticated By: Angelita Ingles, M.D.   US Abdomen Complete  03/12/2012  *RADIOLOGY REPORT*  Clinical Data:  Right upper quadrant abdominal pain, elevated liver function tests  ABDOMINAL ULTRASOUND COMPLETE  Comparison:  Chest CT 03/11/2012 with incomplete visualization of the liver  Findings:  Gallbladder:  Minimal dependent sludge is noted within the gallbladder.  The gallbladder is not distended, with gallbladder wall thickness measuring borderline thickened at 4 mm.  No shadowing calculus or sonographic Murphy's sign noted.  Common Bile Duct:  Within normal limits in caliber.  Liver: No focal mass lesion identified.  Within normal limits in parenchymal echogenicity.  IVC:  Appears normal.  Pancreas:  No abnormality identified.  Spleen:  Within normal limits in size and echotexture.  Right kidney:  Normal in size and parenchymal echogenicity.  No evidence of mass or hydronephrosis.  Left kidney:  Normal in size and parenchymal echogenicity.  No evidence of mass or hydronephrosis.  Abdominal Aorta:  Small amount of ascites is noted.  Trace left pleural effusion.  IMPRESSION: Minimal sludge within the gallbladder with borderline wall thickening.  This could indicate cholecystitis in the appropriate  clinical context.  Trace ascites.  Original Report Authenticated By: Arline Asp, M.D.   Ct Maxillofacial W/cm  04/01/2012  *RADIOLOGY REPORT*  Clinical Data: Swelling on the right for several weeks.  Jaw pain.  CT MAXILLOFACIAL WITH CONTRAST  Technique:  Multidetector CT imaging of the maxillofacial structures was performed with intravenous contrast. Multiplanar CT image reconstructions were also generated.  Contrast: 46mL OMNIPAQUE IOHEXOL 300 MG/ML  SOLN  Comparison: None.  Findings: In the right buccal region, there is a complex fluid collection spanning over 5 x 2.1 x 1.1 cm.  This is immediately adjacent to the significant dental disease.  Mild cervical spondylotic changes.  Visualized mastoid air cells, middle ear cavities and paranasal sinuses are clear.  IMPRESSION: Probable abscess right buccal region most likely dental in origin given the degree of significant caries and periapical lucency of the molars.  Results called to Bari Mantis patient's nurse 04/01/2012 3:20 a.m. with request to call report first in the morning.  Original Report Authenticated By: Doug Sou, M.D.   Dg Chest Port 1 View  03/31/2012  *RADIOLOGY REPORT*  Clinical Data: Assess infiltrate.  PORTABLE CHEST - 1 VIEW  Comparison: 03/28/2012  Findings: Very low lung volumes.  Worsening bibasilar opacities and probable edema.  Bilateral effusions.  Cardiomegaly.  IMPRESSION: Increasing pulmonary edema, bibasilar opacities and effusions. Very low lung volumes.  Original Report Authenticated By: Raelyn Number, M.D.    Scheduled Meds:    . albuterol  2.5 mg Nebulization Q6H  . amLODipine  5 mg Oral Daily  . atorvastatin  20 mg Oral q1800  . dextrose      . diphenhydrAMINE  25 mg Oral Once  . docusate sodium  100 mg Oral BID  . ferric gluconate (FERRLECIT/NULECIT) IV  125 mg Intravenous Once  . ferrous sulfate  325 mg Oral BID WC  . guaiFENesin  1,200 mg Oral BID  . insulin aspart  0-9 Units Subcutaneous TID WC  .  insulin aspart  13 Units Subcutaneous TID AC  . insulin glargine  40 Units Subcutaneous QHS  . iron dextran (INFED/DEXFERRUM) infusion  25 mg Intravenous Once   Followed by  . iron dextran (INFED/DEXFERRUM) infusion  1,500 mg Intravenous Once  . levofloxacin (LEVAQUIN) IV  750 mg Intravenous Q24H  . lidocaine  2 patch Transdermal Q24H  . linezolid  600 mg Oral Q12H  . metoprolol tartrate  50 mg Oral BID  . metronidazole  500 mg Intravenous Q8H  . senna  1 tablet Oral BID  . sodium chloride  3 mL Intravenous Q12H   Continuous Infusions:    . sodium chloride 100 mL/hr (04/03/12 1839)  . sodium chloride 75 mL/hr at 04/04/12 0900   PRN Meds:.sodium chloride, acetaminophen, acetaminophen, HYDROcodone-acetaminophen, morphine, ondansetron (ZOFRAN) IV, ondansetron, oxyCODONE, sodium chloride  Assessment & Plan   Brief narrative:  46 year old man recently treated for streptococcus pneumoniae bacteremia and pneumonia , anemia, right-sided facial swelling and elevated liver enzymes along with acute renal failure during his first hospitalization about 2 weeks ago, patient received a PICC line and was then discharged on IV Rocephin to home, he presented back to the hospital last week with fever and chills, this time he was diagnosed with buccal abscess, was able right-sided parapneumonic effusion, persistently elevated liver enzymes despite negative hepatitis panel and mild gallbladder sludge with negative HIDA, he was being seen by GI, pulmonary, ID, renal, hematology.  His antibiotics have been switched around by infectious disease physicians, he continues to have moderate to severe anemia probably anemia of acute disease in combination with iron deficiency worsened by frequent lab draws, his INR is being replaced he has been seen by hematology and will be followed up by them, GI has ordered further immunological testing for elevated transaminases, pulmonary has evaluated him several times for his  right-sided parapneumonic effusion however effusion size is too small to be tapped. He has developed mild non-oliguric acute renal insufficiency again with negative ultrasound, this is likely due to vancomycin and anemia, vancomycin has been stopped patient is improving with gentle IV fluids which will be continued. His edema has improved thereafter Lasix has been stopped also.   Past medical history:  Diabetes mellitus  Consultations:  Pulmonology Gastroenterology ENT  Physical therapy: Home health Hematology   Procedures:  April 9: Upper endoscopy: LA grade C esophagitis. Protonix 40 mg by mouth twice a day- Dr Benson [place] April 10: Colonoscopy - Dr Collene Mares April 11: I&D of Rt Parotid Abscess - Dr Hoyt Koch   Antibiotics:  April 6: Vancomycin  April 6: Zosyn ABX on 12th switched to Zyvox-Levaquin-Flagyl      #1. 46 y.o. male admitted on 03/27/2012, with recurrent pneumonia along with pleural effusion concerning for empyema, recent admission for Streptococcus pneumoniae bacteremia and was discharged on IV Rocephin, now comes back with fever chills. ID now changed ABX to Zyvox-Lavaquin and Falgyl due to ? Marrow suppression and ARF on 12th, D/W Dr Wendie Agreste, will monitor. Pulm saw pt again not enough Pl fluid to Tap-ABX continue.     #2.Mixed pattern elevated LFTs: Etiology not clear. Abdominal ultrasound unremarkable on the 6th, hepatitis panel negative and recent HIDA scan was negative. ?? Possibly related to effusion. Has bumped up again on 12th, GI following patient, repeat US shows evidence of gallbladder sludge I discussed the case personally with surgeon on call Dr. Sharlett Iles who recommended no surgical intervention at this time as there is no sign of acute cholecystitis on exam or on the ultrasound, with recent negative HIDA scan, will also check an echo to rule out right-sided heart failure with hepatic congestion.     #3. Hyponatremia: Stable. Likely related to current  pneumonia/pulmonary process.      #4. Anemia: No history of bleeding. Fecal occult blood negative. No previous data prior to last hospitalization when he presented with hemoglobin of 10 which quickly decreased, probably from dilution also could be a medication effect. His anemia panel does show severely low iron although ferritin is stable (however it is a acute phase reactant) . Appreciate gastroenterology evaluation, EGD showing grade C. esophagitis placed on PPI, limited colonoscopy also unremarkable, stable post 1 unit on 11th, ? Marrow suppression from Zosyn + AOAD  and iron deficiency, IV iron being given on recommendation of hematology we'll continue to monitor H&H hematology follow.    #5. Diabetes mellitus type 2, uncontrolled: Continue sliding scale insulin, Lantus, meal coverage increased.  CBG (last 3)   Basename 04/04/12 1124 04/04/12 0706 04/03/12 2141  GLUCAP 82 116* 164*      #6. Right buccal mucosa mass: This developed during his last hospitalization. Was seen by ENT and oral surgery, underwent I&D of a right parotid abscess by Dr. Hoyt Koch on 04/02/2012, antibiotics as #1 above.  .   #7. Acute renal failure with edema - renal failure worse today along with LFTs, could have been due to combination of vancomycin and anemia, [place] stable, FeNa 1.37, urine does show mild proteinuria and since patient is diabetic we'll try to place him on ACE. but once creatinine is stable will need outpatient renal follow up. Continue to hold lasix, gentle IVF, vancomycin stopped 03-03-12.    DVT Prophylaxis  SCDs      Thurnell Lose M.D on 04/04/2012 at 1:31 PM  [facility] Office  [phone]

## 2012-04-05 ENCOUNTER — Inpatient Hospital Stay (HOSPITAL_COMMUNITY): Payer: Managed Care, Other (non HMO)

## 2012-04-05 LAB — GLUCOSE, CAPILLARY
Glucose-Capillary: 74 mg/dL (ref 70–99)
Glucose-Capillary: 130 mg/dL — ABNORMAL HIGH (ref 70–99)
Glucose-Capillary: 49 mg/dL — ABNORMAL LOW (ref 70–99)
Glucose-Capillary: 57 mg/dL — ABNORMAL LOW (ref 70–99)
Glucose-Capillary: 101 mg/dL — ABNORMAL HIGH (ref 70–99)
Glucose-Capillary: 95 mg/dL (ref 70–99)
Glucose-Capillary: 102 mg/dL — ABNORMAL HIGH (ref 70–99)

## 2012-04-05 LAB — CBC
HCT: 25.5 % — ABNORMAL LOW (ref 39.0–52.0)
Hemoglobin: 8.3 g/dL — ABNORMAL LOW (ref 13.0–17.0)
MCH: 28.8 pg (ref 26.0–34.0)
MCHC: 32.5 g/dL (ref 30.0–36.0)
MCV: 88.5 fL (ref 78.0–100.0)
Platelets: 339 10*3/uL (ref 150–400)
RBC: 2.88 MIL/uL — ABNORMAL LOW (ref 4.22–5.81)
RDW: 14.5 % (ref 11.5–15.5)
WBC: 9.7 10*3/uL (ref 4.0–10.5)

## 2012-04-05 LAB — BASIC METABOLIC PANEL
BUN: 23 mg/dL (ref 6–23)
CO2: 26 mEq/L (ref 19–32)
Calcium: 8.6 mg/dL (ref 8.4–10.5)
Chloride: 103 mEq/L (ref 96–112)
Creatinine, Ser: 1.69 mg/dL — ABNORMAL HIGH (ref 0.50–1.35)
GFR calc Af Amer: 55 mL/min — ABNORMAL LOW (ref >90)
GFR calc non Af Amer: 47 mL/min — ABNORMAL LOW (ref >90)
Glucose, Bld: 104 mg/dL — ABNORMAL HIGH (ref 70–99)
Potassium: 4.2 mEq/L (ref 3.5–5.1)
Sodium: 137 mEq/L (ref 135–145)

## 2012-04-05 MED ORDER — INSULIN GLARGINE 100 UNIT/ML ~~LOC~~ SOLN
20.0000 [IU] | Freq: Every day | SUBCUTANEOUS | Status: DC
  Administered 2012-04-05 – 2012-04-08 (×4): 20 [IU] via SUBCUTANEOUS

## 2012-04-05 MED ORDER — INSULIN GLARGINE 100 UNIT/ML ~~LOC~~ SOLN: 30.0000 [IU] | Freq: Every day | SUBCUTANEOUS | Status: DC

## 2012-04-05 MED ORDER — INSULIN ASPART 100 UNIT/ML ~~LOC~~ SOLN
7.0000 [IU] | Freq: Three times a day (TID) | SUBCUTANEOUS | Status: DC
  Administered 2012-04-05: 7 [IU] via SUBCUTANEOUS

## 2012-04-05 MED ORDER — ASPIRIN EC 81 MG PO TBEC
81.0000 mg | DELAYED_RELEASE_TABLET | Freq: Once | ORAL | Status: AC
  Administered 2012-04-05: 81 mg via ORAL
  Filled 2012-04-05: qty 1

## 2012-04-05 NOTE — Progress Notes (Signed)
[name] [name] CSN:[id number],MRN:[id number] is a 46 y.o. male,  Outpatient Primary MD for the patient is [name], MD, MD  Chief Complaint  Patient presents with  . Emesis        Subjective:    [name] [name] today has, No headache, No chest pain, No abdominal pain - No Nausea, No new weakness tingling or numbness, No Cough - SOB. On 04/05/2012 He is complaining of left eye purple colored squeakily worm on his left eye vision field(10" O clock position) does not report of any vision loss or weakness or headache.   Objective:   Filed Vitals:   04/04/12 1459 04/04/12 2115 04/05/12 0530 04/05/12 0759  BP:  147/89 132/80   Pulse:  88 80   Temp:  99.4 F (37.4 C) 98.3 F (36.8 C)   TempSrc:  Oral Oral   Resp:  17 20   Height:      Weight:      SpO2: 90% 93% 96% 85%    Wt Readings from Last 3 Encounters:  04/04/12 91.3 kg (201 lb 4.5 oz)  04/04/12 91.3 kg (201 lb 4.5 oz)  04/04/12 91.3 kg (201 lb 4.5 oz)     Intake/Output Summary (Last 24 hours) at 04/05/12 1006 Last data filed at 04/05/12 0410  Gross per 24 hour  Intake    240 ml  Output    450 ml  Net   -210 ml    Exam Awake Alert, Oriented *3, No new F.N deficits, Normal affect [place].AT,PERRAL. Right-sided facial swelling. Supple Neck,No JVD, No cervical lymphadenopathy appriciated.  Symmetrical Chest wall movement, Good air movement bilaterally, CTAB RRR,No Gallops,Rubs or new Murmurs, No Parasternal Heave +ve B.Sounds, Abd Soft, Non tender, No organomegaly appriciated, No rebound -guarding or rigidity. No Cyanosis, Clubbing , 1+edema, No new Rash or bruise      Data Review   CBC  Lab 04/05/12 0556 04/04/12 0720 04/03/12 0953 04/02/12 0605 04/01/12 0630 03/31/12 0500  WBC 9.7 7.3 -- 11.0* 9.1 10.1  HGB 8.3* 7.6* 8.5* 7.6* 8.2* --  HCT 25.5* 23.2* 26.4* 23.2* 25.0* --  PLT 339 261 -- 271 313 281  MCV 88.5 87.2 -- 87.9 87.4 85.6  MCH 28.8 28.6 -- 28.8 28.7 28.8  MCHC 32.5 32.8 -- 32.8 32.8  33.6  RDW 14.5 14.6 -- 15.1 15.4 15.7*  LYMPHSABS -- -- -- -- -- --  MONOABS -- -- -- -- -- --  EOSABS -- -- -- -- -- --  BASOSABS -- -- -- -- -- --  BANDABS -- -- -- -- -- --    Chemistries   Lab 04/05/12 0556 04/04/12 0720 04/03/12 1120 04/03/12 0630 04/01/12 0630 03/31/12 0500  NA 137 139 137 132* 136 --  K 4.2 3.9 4.1 4.1 3.5 --  CL 103 102 97 95* 98 --  CO2 26 29 29 28 30  --  GLUCOSE 104* 116* 88 240* 99 --  BUN 23 25* 27* 27* 18 --  CREATININE 1.69* 1.60* 1.54* 1.66* 0.91 --  CALCIUM 8.6 8.4 8.7 8.5 8.7 --  MG -- -- -- -- -- 1.9  AST -- 69* 132* 161* 46* 103*  ALT -- 101* 141* 128* 91* 127*  ALKPHOS -- 1151* 1491* 1356* 714* 792*  BILITOT -- 0.4 0.6 0.6 0.5 0.6   ------------------------------------------------------------------------------------------------------------------ estimated creatinine clearance is 64.2 ml/min (by C-G formula based on Cr of 1.69). ------------------------------------------------------------------------------------------------------------------ No results found for this basename: HGBA1C:2 in the last 72 hours ------------------------------------------------------------------------------------------------------------------ No results found for this basename:  CHOL:2,HDL:2,LDLCALC:2,TRIG:2,CHOLHDL:2,LDLDIRECT:2 in the last 72 hours ------------------------------------------------------------------------------------------------------------------ No results found for this basename: TSH,T4TOTAL,FREET3,T3FREE,THYROIDAB in the last 72 hours ------------------------------------------------------------------------------------------------------------------  [facility] 04/04/12 1210  VITAMINB12 679  FOLATE 13.9  FERRITIN 1235*  TIBC 195*  IRON 19*  RETICCTPCT 1.7    Coagulation profile No results found for this basename: INR:5,PROTIME:5 in the last 168 hours  No results found for this basename: DDIMER:2 in the last 72 hours  Cardiac Enzymes No  results found for this basename: CK:3,CKMB:3,TROPONINI:3,MYOGLOBIN:3 in the last 168 hours ------------------------------------------------------------------------------------------------------------------ No components found with this basename: POCBNP:3  Micro Results Recent Results (from the past 240 hour(s))  CULTURE, BLOOD (ROUTINE X 2)     Status: Normal   Collection Time   03/28/12 12:15 AM      Component Value Range Status Comment   Specimen Description BLOOD LEFT ARM   Final    Special Requests BOTTLES DRAWN AEROBIC AND ANAEROBIC 10CC   Final    Culture  Setup Time [id number]   Final    Culture NO GROWTH 5 DAYS   Final    Report Status 04/03/2012 FINAL   Final   CULTURE, BLOOD (ROUTINE X 2)     Status: Normal   Collection Time   03/28/12 12:25 AM      Component Value Range Status Comment   Specimen Description BLOOD RIGHT ARM   Final    Special Requests BOTTLES DRAWN AEROBIC ONLY 10CC   Final    Culture  Setup Time [id number]   Final    Culture NO GROWTH 5 DAYS   Final    Report Status 04/03/2012 FINAL   Final   WOUND CULTURE     Status: Normal   Collection Time   04/02/12 12:30 PM      Component Value Range Status Comment   Specimen Description WOUND MOUTH RIGHT   Final    Special Requests ORAL MUCOSA   Final    Gram Stain     Final    Value: FEW WBC PRESENT, PREDOMINANTLY PMN     NO SQUAMOUS EPITHELIAL CELLS SEEN     NO ORGANISMS SEEN   Culture NO GROWTH 2 DAYS   Final    Report Status 04/04/2012 FINAL   Final     Radiology Reports Dg Chest 2 View  03/28/2012  *RADIOLOGY REPORT*  Clinical Data: Cough, right rib pain, emesis, recent pneumonia  CHEST - 2 VIEW  Comparison: 03/16/2012  Findings: Patchy right lower lobe opacity, suspicious for pneumonia, with associated small-to-moderate right pleural effusion.  Left basilar opacity, atelectasis versus pneumonia, with suspected small left pleural effusion.  No pneumothorax.  The heart is normal in size.  Interval removal of  right subclavian PICC.  Visualized osseous structures are within normal limits.  IMPRESSION: Patchy right lower lobe opacity, suspicious for pneumonia, with associated small-to-moderate right pleural effusion.  Left basilar opacity, atelectasis versus pneumonia, with suspected small left pleural effusion.  Original Report Authenticated By: Julian Hy, M.D.   Dg Chest 2 View  03/16/2012  *RADIOLOGY REPORT*  Clinical Data: History of pneumonia.  CHEST - 2 VIEW  Comparison: 03/11/2012.  Findings: Tip of right PICC terminates in right atrial region.  No pneumothorax is seen.  There is some atelectasis and infiltrate in the medial posterior aspect of the left lung.  There are atelectasis and infiltrative densities seen within the right perihilar region and within the right midlung and right lower lung areas with consolidation and atelectasis.  There is improvement in the amount  of infiltrate compared to previous study.   On the lateral image there is continued abnormal opacity inferiorly and posteriorly.  Indistinctness of costophrenic angles may reflect an element of pleural effusion.  IMPRESSION: Right PICC in place.  Some atelectasis and infiltrative density in the medial posterior left lung inferiorly. here are atelectasis and infiltrative densities seen within the right perihilar region and within the right midlung and right lower lung areas with consolidation and atelectasis.  There is improvement in the amount of infiltrative compared to previous study but clearing has not occurred. Question small amounts of pleural effusion.  Original Report Authenticated By: Delane Ginger, M.D.   Dg Chest 2 View  03/11/2012  *RADIOLOGY REPORT*  Clinical Data: Shortness of breath with right lower chest pain and fever.  CHEST - 2 VIEW  Comparison: None.  Findings: Trachea is midline.  Heart size normal.  There is dense airspace consolidation in the right lower lobe.  Question mild left lower lobe air space disease.  No  left pleural fluid.  IMPRESSION:  1.  Dense airspace consolidation in the right lower lobe is most consistent with pneumonia.  Follow-up to clearing is recommended, to exclude a centrally obstructing mass. 2.  Question mild left lower lobe air space disease.  Original Report Authenticated By: Luretha Rued, M.D.   Ct Chest Wo Contrast  03/28/2012  *RADIOLOGY REPORT*  Clinical Data: Cough, right-sided chest pain  CT CHEST WITHOUT CONTRAST  Technique:  Multidetector CT imaging of the chest was performed following the standard protocol without IV contrast.  Comparison: Chest radiographs dated 03/28/2012.  CT chest dated 03/11/2012.  Findings: Patchy posterior right upper lobe opacity and near complete right lower lobe consolidation, suspicious for pneumonia. Associated moderate right pleural effusion.  Patchy left lower lobe opacity, atelectasis versus pneumonia, with associated small left pleural effusion.  No pneumothorax.  Heart is normal in size.  Small pericardial effusion.  Visualized upper abdomen is unremarkable.  Mild degenerative changes of the visualized thoracolumbar spine.  IMPRESSION: Right upper/lower lobe pneumonia, with associated moderate right pleural effusion.  Patchy left lower lobe opacity, atelectasis versus pneumonia, with associated small left pleural effusion.  Original Report Authenticated By: Julian Hy, M.D.   Ct Angio Chest W/cm &/or Wo Cm  03/11/2012  *RADIOLOGY REPORT*  Clinical Data: Fever.  Cough.  Weakness.  Short of breath.  CT ANGIOGRAPHY CHEST  Technique:  Multidetector CT imaging of the chest using the standard protocol during bolus administration of intravenous contrast. Multiplanar reconstructed images including MIPs were obtained and reviewed to evaluate the vascular anatomy.  Contrast: 6mL OMNIPAQUE IOHEXOL 350 MG/ML IV SOLN  Comparison: Chest radiography same day  Findings: Pulmonary arterial opacification is excellent.  There are no pulmonary emboli.  There  is consolidative pneumonia throughout the right lower lobe with minor involvement of the right upper lobe.  There is consolidative pneumonia less extensively in the posterior aspect of the left lower lobe.  There is a small amount of pleural fluid on the right.  There is a tiny amount of pericardial fluid.  There are small mediastinal lymph nodes, likely reactive.  No upper abdominal pathology is seen.  IMPRESSION: No pulmonary emboli.  Consolidative pneumonia throughout the right lower lobe.  Partial involvement of the left lower lobe by consolidative  pneumonia. Minimal involvement of the right upper lobe.  Original Report Authenticated By: Jules Schick, M.D.   Nm Hepatobiliary  03/14/2012  *RADIOLOGY REPORT*  Clinical Data: Abdominal pain, evaluate for cholecystitis  NUCLEAR MEDICINE HEPATOHBILIARY INCLUDE GB  Radiopharmaceutical:  5.5 mCi technetium 65m Choletec  Comparison: Ultrasound dated 03/12/2012.  Findings: Normal hepatic uptake.  Small bowel is visible within 20 minutes.  Gallbladder is visible within 30 minutes, confirming cystic duct patency.  IMPRESSION: Normal hepatobiliary nuclear medicine scan.  Original Report Authenticated By: Julian Hy, M.D.   [place] Chest  03/28/2012  *RADIOLOGY REPORT*  Clinical Data: 46 year old male with pneumonia and pleural effusions on chest CT.  Assess for thoracentesis.  CHEST ULTRASOUND  Comparison: Chest CT without contrast 03/28/2012 and earlier.  Findings: Gray-scale imaging of the right hemithorax demonstrates a small component of pleural fluid, with a larger adjacent component of consolidated lung.  No free fluid seen subjacent to the right hemidiaphragm.  The quantity of pleural fluid identified is insufficient for thoracentesis.  IMPRESSION: Right pleural effusion and larger component of right lung consolidation on ultrasound.  Pleural fluid component quantity insufficient for thoracentesis at this time.  Original Report Authenticated By: Randall An, M.D.   US Abdomen Complete  03/28/2012  *RADIOLOGY REPORT*  Clinical Data:  Elevated LFTs.  Right upper quadrant pain and nausea.  ABDOMINAL ULTRASOUND COMPLETE  Comparison:  03/12/2012  Findings:  Gallbladder:  No gallstones, gallbladder wall thickening, or pericholecystic fluid. Negative sonographic Murphy's sign reported by the sonographer.  Common Bile Duct:  Within normal limits in caliber.  Liver: No focal mass lesion identified.  Within normal limits in parenchymal echogenicity.  IVC:  Appears normal.  Pancreas:  No abnormality identified.  Spleen:  Within normal limits in size and echotexture.  Right kidney:  Normal in size and parenchymal echogenicity.  No evidence of mass or hydronephrosis.  Left kidney:  Normal in size and parenchymal echogenicity.  No evidence of mass or hydronephrosis.  Abdominal Aorta:  No aneurysm identified.  Other findings:  Small bilateral pleural effusions.  Small amount of upper abdominal ascites posterior to the liver and spleen.  IMPRESSION:  1.  Small amount of abdominal ascites. 2.  Small pleural effusions.  Original Report Authenticated By: Angelita Ingles, M.D.   US Abdomen Complete  03/12/2012  *RADIOLOGY REPORT*  Clinical Data:  Right upper quadrant abdominal pain, elevated liver function tests  ABDOMINAL ULTRASOUND COMPLETE  Comparison:  Chest CT 03/11/2012 with incomplete visualization of the liver  Findings:  Gallbladder:  Minimal dependent sludge is noted within the gallbladder.  The gallbladder is not distended, with gallbladder wall thickness measuring borderline thickened at 4 mm.  No shadowing calculus or sonographic Murphy's sign noted.  Common Bile Duct:  Within normal limits in caliber.  Liver: No focal mass lesion identified.  Within normal limits in parenchymal echogenicity.  IVC:  Appears normal.  Pancreas:  No abnormality identified.  Spleen:  Within normal limits in size and echotexture.  Right kidney:  Normal in size and parenchymal  echogenicity.  No evidence of mass or hydronephrosis.  Left kidney:  Normal in size and parenchymal echogenicity.  No evidence of mass or hydronephrosis.  Abdominal Aorta:  Small amount of ascites is noted.  Trace left pleural effusion.  IMPRESSION: Minimal sludge within the gallbladder with borderline wall thickening.  This could indicate cholecystitis in the appropriate clinical context.  Trace ascites.  Original Report Authenticated By: Arline Asp, M.D.   Ct Maxillofacial W/cm  04/01/2012  *RADIOLOGY REPORT*  Clinical Data: Swelling on the right for several weeks.  Jaw pain.  CT MAXILLOFACIAL WITH CONTRAST  Technique:  Multidetector CT imaging of the maxillofacial structures  was performed with intravenous contrast. Multiplanar CT image reconstructions were also generated.  Contrast: 63mL OMNIPAQUE IOHEXOL 300 MG/ML  SOLN  Comparison: None.  Findings: In the right buccal region, there is a complex fluid collection spanning over 5 x 2.1 x 1.1 cm.  This is immediately adjacent to the significant dental disease.  Mild cervical spondylotic changes.  Visualized mastoid air cells, middle ear cavities and paranasal sinuses are clear.  IMPRESSION: Probable abscess right buccal region most likely dental in origin given the degree of significant caries and periapical lucency of the molars.  Results called to Bari Mantis patient's nurse 04/01/2012 3:20 a.m. with request to call report first in the morning.  Original Report Authenticated By: Doug Sou, M.D.   Dg Chest Port 1 View  03/31/2012  *RADIOLOGY REPORT*  Clinical Data: Assess infiltrate.  PORTABLE CHEST - 1 VIEW  Comparison: 03/28/2012  Findings: Very low lung volumes.  Worsening bibasilar opacities and probable edema.  Bilateral effusions.  Cardiomegaly.  IMPRESSION: Increasing pulmonary edema, bibasilar opacities and effusions. Very low lung volumes.  Original Report Authenticated By: Raelyn Number, M.D.    Echo  - Left ventricle: The cavity  size was normal. Wall thickness was increased in a pattern of mild LVH. Systolic function was normal. The estimated ejection fraction was in the range of 55% to 60%. - Atrial septum: No defect or patent foramen ovale was identified. - Pericardium, extracardiac: Small pericardial effusion with no evidence of tamponade    Scheduled Meds:    . albuterol  2.5 mg Nebulization Q6H  . amLODipine  5 mg Oral Daily  . atorvastatin  20 mg Oral q1800  . diphenhydrAMINE  25 mg Oral Once  . docusate sodium  100 mg Oral BID  . ferrous sulfate  325 mg Oral BID WC  . guaiFENesin  1,200 mg Oral BID  . insulin aspart  0-9 Units Subcutaneous TID WC  . insulin aspart  7 Units Subcutaneous TID AC  . insulin glargine  30 Units Subcutaneous QHS  . iohexol  20 mL Oral Q1 Hr x 2  . iron dextran (INFED/DEXFERRUM) infusion  25 mg Intravenous Once   Followed by  . iron dextran (INFED/DEXFERRUM) infusion  1,500 mg Intravenous Once  . levofloxacin (LEVAQUIN) IV  750 mg Intravenous Q24H  . lidocaine  2 patch Transdermal Q24H  . linezolid  600 mg Oral Q12H  . metoprolol tartrate  50 mg Oral BID  . metronidazole  500 mg Intravenous Q8H  . senna  1 tablet Oral BID  . sodium chloride  3 mL Intravenous Q12H  . DISCONTD: ferric gluconate (FERRLECIT/NULECIT) IV  125 mg Intravenous Once  . DISCONTD: insulin aspart  13 Units Subcutaneous TID AC  . DISCONTD: insulin aspart  9 Units Subcutaneous TID AC  . DISCONTD: insulin glargine  35 Units Subcutaneous QHS  . DISCONTD: insulin glargine  40 Units Subcutaneous QHS  . DISCONTD: iron dextran (INFED/DEXFERRUM) infusion  1,500 mg Intravenous Once  . DISCONTD: iron dextran (INFED/DEXFERRUM) infusion  25 mg Intravenous Once   Continuous Infusions:    . sodium chloride 75 mL/hr at 04/04/12 0900   PRN Meds:.sodium chloride, acetaminophen, acetaminophen, HYDROcodone-acetaminophen, morphine, ondansetron (ZOFRAN) IV, ondansetron, oxyCODONE, sodium chloride  Assessment &  Plan   Brief narrative:  46 year old man recently treated for streptococcus pneumoniae bacteremia and pneumonia , anemia, right-sided facial swelling and elevated liver enzymes along with acute renal failure during his first hospitalization about 2 weeks ago, patient received a  PICC line and was then discharged on IV Rocephin to home, he presented back to the hospital last week with fever and chills, this time he was diagnosed with buccal abscess, was able right-sided parapneumonic effusion, persistently elevated liver enzymes despite negative hepatitis panel and mild gallbladder sludge with negative HIDA, he was being seen by GI, pulmonary, ID, renal, hematology.  His antibiotics have been switched around by infectious disease physicians, he continues to have moderate to severe anemia probably anemia of acute disease in combination with iron deficiency worsened by frequent lab draws, his INR is being replaced he has been seen by hematology and will be followed up by them, GI has ordered further immunological testing for elevated transaminases, pulmonary has evaluated him several times for his right-sided parapneumonic effusion however effusion size is too small to be tapped. He has developed mild non-oliguric acute renal insufficiency again with negative ultrasound, this is likely due to vancomycin and anemia, vancomycin has been stopped patient is improving with gentle IV fluids which will be continued. His edema has improved thereafter Lasix has been stopped also.   Past medical history:  Diabetes mellitus    Consultations:  Pulmonology Gastroenterology ENT  Physical therapy: Home health Hematology   Procedures:  April 9: Upper endoscopy: LA grade C esophagitis. Protonix 40 mg by mouth twice a day- Dr Benson [place] April 10: Colonoscopy - Dr Collene Mares April 11: I&D of Rt Parotid Abscess - Dr Hoyt Koch   Antibiotics:  April 6: Vancomycin  April 6: Zosyn ABX on 12th switched to  Zyvox-Levaquin-Flagyl      #1. 46 y.o. male admitted on 03/27/2012, with recurrent pneumonia along with pleural effusion concerning for empyema, recent admission for Streptococcus pneumoniae bacteremia and was discharged on IV Rocephin, now comes back with fever chills. ID now changed ABX to Zyvox-Lavaquin and Falgyl due to ? Marrow suppression and ARF on 12th, D/W Dr Wendie Agreste, will monitor. Pulm saw pt again not enough Pl fluid to Tap-ABX continue. Will repeat a two-view chest x-ray on April 14.     #2.Mixed pattern elevated LFTs: Etiology not clear. Abdominal ultrasound unremarkable on the 6th, hepatitis panel negative and recent HIDA scan was negative. ?? Possibly related to effusion. Has bumped up again on 12th, GI following patient, repeat US shows evidence of gallbladder sludge I discussed the case personally with surgeon on call Dr. Sharlett Iles who recommended no surgical intervention at this time as there is no sign of acute cholecystitis on exam or on the ultrasound, with recent negative HIDA scan, echogram shows mild LV hypertrophy but no frank acute heart failure.    #3. Hyponatremia: Stable. Likely related to current pneumonia/pulmonary process.      #4. Anemia: No history of bleeding. Fecal occult blood negative. No previous data prior to last hospitalization when he presented with hemoglobin of 10 which quickly decreased, probably from dilution also could be a medication effect. His anemia panel does show severely low iron although ferritin is stable (however it is a acute phase reactant) . Appreciate gastroenterology evaluation, EGD showing grade C. esophagitis placed on PPI, limited colonoscopy also unremarkable, stable post 1 unit on 11th, ? Marrow suppression from Zosyn + AOAD  and iron deficiency, IV iron being given on recommendation of hematology we'll continue to monitor H&H hematology follow.    #5. Diabetes mellitus type 2, uncontrolled: Continue sliding scale insulin,  Lantus, meal coverage, do 2 acute renal insufficiency patient's insulin dose has been reduced on 04/05/2012, few episodes of borderline hypoglycemia were  noted.    CBG (last 3)   Basename 04/05/12 0625 04/04/12 2142 04/04/12 2056  GLUCAP 130* 72 67*      #6. Right buccal mucosa mass: This developed during his last hospitalization. Was seen by ENT and oral surgery, underwent I&D of a right parotid abscess by Dr. Hoyt Koch on 04/02/2012, antibiotics as #1 above.  .   #7. Acute renal failure with edema - renal failure worse today along with LFTs, could have been due to combination of vancomycin and anemia, [place] stable, FeNa 1.37, urine does show mild proteinuria and since patient is diabetic we'll try to place him on ACE. but once creatinine is stable will need outpatient renal follow up. Continue to hold lasix, gentle IVF, vancomycin stopped 03-03-12. Have discussed the case on 04/05/2012 with Dr. Burnett Sheng nephrologist on call who does not recommend any change in  plan of care at this time, most likely this ATN which was improved in due course of time. Patient likely has underlying CKD due to his diabetes.     #8. Tubular adenoma on colonic polyp biopsy outpatient GI followup.     #9. Left eye 10:00 position sensation of purple colored loose body which per patient started yesterday evening. With no vision loss, no focal neurological deficit, no eye pain or headache. Have discussed it with Dr. Satira Sark ophthalmology who will see the patient shortly on 04/05/2012. Further testing as recommended by ophthalmology. Will give him a dose of baby aspirin today although I doubt this is CNS in origin.    DVT Prophylaxis  SCDs      Thurnell Lose M.D on 04/05/2012 at 10:06 AM  [facility] Office  [phone]

## 2012-04-05 NOTE — Consult Note (Signed)
CC: Visual change HPI: Patient is a 46 y/o male who has a history of uncontrolled DM, currently admitted for treatment of pneumonia, anemia, buccal abscess, and elevated LFTs who developed a new vision change yesterday. He reports that he has had some loss of clarity of his vision OU since March, but yesterday developed a squiggly worm in the upper aspect of his vision OS. He says OD is normal. No pain or HA. The area of the "worm" is translucent, not completely dark. It moves with his vision, but lags slightly behind his eye movements if he looks from side to side. He does not wear glasses and has not had a recent eye exam. He says as best he can remember, last A1c was about 13.   Past Medical History  Diagnosis Date  . Diabetes mellitus   . Pneumonia 02/2012    Strep pneumoniae bilateral pneumonia complicated by bacteremia  . Bacteremia    POH: Negative, does not wear glasses or readers.    History   Social History  . Marital Status: Married    Spouse Name: N/A    Number of Children: N/A  . Years of Education: N/A   Occupational History  . Not on file.   Social History Main Topics  . Smoking status: Never Smoker   . Smokeless tobacco: Never Used  . Alcohol Use: No  . Drug Use: No  . Sexually Active:    Other Topics Concern  . Not on file   Social History Narrative   He is from [place] and lives at home here with his daughter. He is divorced. 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    Allergies  Allergen Reactions  . Penicillins Rash    Tolerating Ceftriaxone (03/12/12)   Scheduled Meds:   . albuterol  2.5 mg Nebulization Q6H  . amLODipine  5 mg Oral Daily  . aspirin EC  81 mg Oral Once  . atorvastatin  20 mg Oral q1800  . diphenhydrAMINE  25 mg Oral Once  . docusate sodium  100 mg Oral BID  . ferrous sulfate  325 mg Oral BID WC  . guaiFENesin  1,200 mg Oral BID  . insulin aspart  0-9 Units Subcutaneous TID WC  . insulin  glargine  20 Units Subcutaneous QHS  . iohexol  20 mL Oral Q1 Hr x 2  . iron dextran (INFED/DEXFERRUM) infusion  25 mg Intravenous Once   Followed by  . iron dextran (INFED/DEXFERRUM) infusion  1,500 mg Intravenous Once  . levofloxacin (LEVAQUIN) IV  750 mg Intravenous Q24H  . lidocaine  2 patch Transdermal Q24H  . linezolid  600 mg Oral Q12H  . metoprolol tartrate  50 mg Oral BID  . senna  1 tablet Oral BID  . sodium chloride  3 mL Intravenous Q12H  . DISCONTD: insulin aspart  13 Units Subcutaneous TID AC  . DISCONTD: insulin aspart  7 Units Subcutaneous TID AC  . DISCONTD: insulin aspart  9 Units Subcutaneous TID AC  . DISCONTD: insulin glargine  30 Units Subcutaneous QHS  . DISCONTD: insulin glargine  35 Units Subcutaneous QHS  . DISCONTD: insulin glargine  40 Units Subcutaneous QHS   Continuous Infusions:   . sodium chloride 75 mL/hr at 04/04/12 0900   PRN Meds:.sodium chloride, acetaminophen, acetaminophen, HYDROcodone-acetaminophen, morphine, ondansetron (ZOFRAN) IV, ondansetron, oxyCODONE, sodium chloride  ROS: 10 system review positive for right thoracic pain with deep breathing, visual changes per  HPI, no fever/chills/numbness/weakness/HA/rashes/MSK problems/CP. Had recent colonoscopy and upper GI.  EXAM: VA near, with +2.50 add: OD 20/40-, OS 20/50- CVF: Full OD and OS Motility: Full versions Pupils: ERRL, no visible RAPD. IOP (mm Hg by tonopen): OD 13, OS 12  Penlight exam: Lids: Normal OU. Conj - quiet OU Cornea - Clear OU. AC - formed OU. Lens - Clear OU.  Dilated fundus examination with 20D lens (tropicamide and phenylephrine instilled OU at 2:45): OD: There are intraretinal hemorrhages, exudates and cotton wool spots scattered throughout the fundus. Vitreous is clear. Optic nerve is pink and sharp. Heme and exudates present in the macula. OS: There are intraretinal hemorrhages, exudates and cotton wool spots throughout the fundus, including in the macula. The  optic nerve is pink but has overlying neovascularization with an associated small vitreous hemorrhage.  Imp/Plan: Proliferative diabetic retinopathy with macular edema and vitreous hemorrhage. I emphasized to the patient and family the importance of blood glucose control, as well as control of blood pressure and cholesterol. He will need outpatient follow up with a retinal specialist for panretinal photocoagulation, as well as likely additional treatment for macular edema. All questions were answered to the patient and family's satisfaction.

## 2012-04-06 ENCOUNTER — Inpatient Hospital Stay (HOSPITAL_COMMUNITY): Payer: Managed Care, Other (non HMO)

## 2012-04-06 DIAGNOSIS — J90 Pleural effusion, not elsewhere classified: Secondary | ICD-10-CM

## 2012-04-06 DIAGNOSIS — J189 Pneumonia, unspecified organism: Secondary | ICD-10-CM

## 2012-04-06 DIAGNOSIS — D509 Iron deficiency anemia, unspecified: Secondary | ICD-10-CM

## 2012-04-06 DIAGNOSIS — L0201 Cutaneous abscess of face: Secondary | ICD-10-CM

## 2012-04-06 DIAGNOSIS — D638 Anemia in other chronic diseases classified elsewhere: Secondary | ICD-10-CM

## 2012-04-06 DIAGNOSIS — L03211 Cellulitis of face: Secondary | ICD-10-CM

## 2012-04-06 LAB — COMPREHENSIVE METABOLIC PANEL
ALT: 61 U/L — ABNORMAL HIGH (ref 0–53)
AST: 29 U/L (ref 0–37)
Albumin: 2 g/dL — ABNORMAL LOW (ref 3.5–5.2)
Alkaline Phosphatase: 1010 U/L — ABNORMAL HIGH (ref 39–117)
BUN: 22 mg/dL (ref 6–23)
CO2: 29 mEq/L (ref 19–32)
Calcium: 8.9 mg/dL (ref 8.4–10.5)
Chloride: 105 mEq/L (ref 96–112)
Creatinine, Ser: 1.66 mg/dL — ABNORMAL HIGH (ref 0.50–1.35)
GFR calc Af Amer: 56 mL/min — ABNORMAL LOW (ref >90)
GFR calc non Af Amer: 48 mL/min — ABNORMAL LOW (ref >90)
Glucose, Bld: 71 mg/dL (ref 70–99)
Potassium: 4.6 mEq/L (ref 3.5–5.1)
Sodium: 141 mEq/L (ref 135–145)
Total Bilirubin: 0.5 mg/dL (ref 0.3–1.2)
Total Protein: 6.7 g/dL (ref 6.0–8.3)

## 2012-04-06 LAB — GLUCOSE, CAPILLARY
Glucose-Capillary: 100 mg/dL — ABNORMAL HIGH (ref 70–99)
Glucose-Capillary: 146 mg/dL — ABNORMAL HIGH (ref 70–99)
Glucose-Capillary: 70 mg/dL (ref 70–99)
Glucose-Capillary: 133 mg/dL — ABNORMAL HIGH (ref 70–99)
Glucose-Capillary: 118 mg/dL — ABNORMAL HIGH (ref 70–99)
Glucose-Capillary: 138 mg/dL — ABNORMAL HIGH (ref 70–99)

## 2012-04-06 LAB — CBC
HCT: 25.2 % — ABNORMAL LOW (ref 39.0–52.0)
Hemoglobin: 8.2 g/dL — ABNORMAL LOW (ref 13.0–17.0)
MCH: 28.8 pg (ref 26.0–34.0)
MCHC: 32.5 g/dL (ref 30.0–36.0)
MCV: 88.4 fL (ref 78.0–100.0)
Platelets: 350 10*3/uL (ref 150–400)
RBC: 2.85 MIL/uL — ABNORMAL LOW (ref 4.22–5.81)
RDW: 14.6 % (ref 11.5–15.5)
WBC: 7.3 10*3/uL (ref 4.0–10.5)

## 2012-04-06 LAB — BODY FLUID CELL COUNT WITH DIFFERENTIAL
Eos, Fluid: 0 %
Lymphs, Fluid: 20 %
Monocyte-Macrophage-Serous Fluid: 14 % — ABNORMAL LOW (ref 50–90)
Neutrophil Count, Fluid: 65 % — ABNORMAL HIGH (ref 0–25)
Other Cells, Fluid: 1 %
Total Nucleated Cell Count, Fluid: 950 cu mm (ref 0–1000)

## 2012-04-06 LAB — ERYTHROPOIETIN: Erythropoietin: 25.3 m[IU]/mL (ref 2.6–34.0)

## 2012-04-06 LAB — ANA: Anti Nuclear Antibody(ANA): POSITIVE — AB

## 2012-04-06 LAB — ANTI-NUCLEAR AB-TITER (ANA TITER): ANA Titer 1: NEGATIVE

## 2012-04-06 LAB — MITOCHONDRIAL ANTIBODIES: Mitochondrial M2 Ab, IgG: 0.22 (ref <0.91)

## 2012-04-06 LAB — PATHOLOGIST SMEAR REVIEW

## 2012-04-06 MED ORDER — DOCUSATE SODIUM 100 MG PO CAPS: 200.0000 mg | ORAL_CAPSULE | Freq: Every day | ORAL | Status: DC

## 2012-04-06 MED ORDER — ALBUTEROL SULFATE (5 MG/ML) 0.5% IN NEBU
2.5000 mg | INHALATION_SOLUTION | Freq: Three times a day (TID) | RESPIRATORY_TRACT | Status: DC
  Administered 2012-04-06 – 2012-04-07 (×3): 2.5 mg via RESPIRATORY_TRACT
  Filled 2012-04-06 (×3): qty 0.5

## 2012-04-06 MED ORDER — ALBUTEROL SULFATE (5 MG/ML) 0.5% IN NEBU
2.5000 mg | INHALATION_SOLUTION | Freq: Four times a day (QID) | RESPIRATORY_TRACT | Status: DC | PRN
  Administered 2012-04-07: 2.5 mg via RESPIRATORY_TRACT
  Filled 2012-04-06: qty 0.5

## 2012-04-06 NOTE — Progress Notes (Signed)
   CARE MANAGEMENT NOTE 04/06/2012  Patient:  [name] [name], [name] [name]   Account Number:  [id number]  Date Initiated:  03/31/2012  Documentation initiated by:  Lizabeth Leyden  Subjective/Objective Assessment:   Patient admitted with cough and chest pain.     Action/Plan:   Progression of care and discharge planning   Anticipated DC Date:  04/02/2012   Anticipated DC Plan:  [place]  CM consult      PAC Choice  [place]   Choice offered to / List presented to:  C-1 Patient   DME arranged  [place]      DME agency  [place] arranged  [place] [facility]      [place].   Status of service:  In process, will continue to follow Medicare Important Message given?   (If response is "NO", the following Medicare IM given date fields will be blank) Date Medicare IM given:   Date Additional Medicare IM given:    Discharge Disposition:    Per UR Regulation:    If discussed at Long Length of Stay Meetings, dates discussed:    Comments:  PCP: Dr Nolon Rod  04/06/12 Lars Pinks, RN, BSN 1512 PT WAS FOUND TO HAVE BILATERAL PLEURAL EFFUSIONS AND WILL NEED TAPPING, IF PT IS ABLE TO TOLERATE AS HE HAS HAD A FAILED ATTEMPT.  WILL F/U.  04/03/12 Lars Pinks, RN, BSN 1434 UR COMPLETED  04/03/2012 [place], [place] APRIA: will deliver the DME to hospital today.  04/03/2012  [place], [place] Met with patient to discuss discharge plans for home health services.  Reviewed home health agencies, he selected [facility] for Pt and OT. DME: RW and Tub bench [facility]. [facility]/ [facility] referral for PT and OT APRIA/ referral for rollining walker and tub bench.  04/01/12 Lars Pinks, RN ,BSN 1636 PT HAS A DENTAL ABCESS AND WILL NEED TEETH EXTRACTED.  PT STATED THAT HE LIVES ON THE 3RD FLOOR OF AN APARTMENT BUILDING AND FEELS THAT  HE WOULD BENEFIT FROM HH PT AND A TUB BENCH.  PT/OT TO EVAL PT.  WILL F/U ON DC NEEDS.  03/30/2012 [place], [place] Met with patient to discuss discharge planning. CM to continue to follow for discharge needs.

## 2012-04-06 NOTE — Progress Notes (Signed)
SUBJECTIVE:  S/p IV iron which he tolerated well.   LABS:  CBC    Component Value Date/Time   WBC 7.3 04/06/2012 0655   RBC 2.85* 04/06/2012 0655   HGB 8.2* 04/06/2012 0655   HCT 25.2* 04/06/2012 0655   PLT 350 04/06/2012 0655   MCV 88.4 04/06/2012 0655   MCH 28.8 04/06/2012 0655   MCHC 32.5 04/06/2012 0655   RDW 14.6 04/06/2012 0655     Basename 04/06/12 0655 04/05/12 0556  NA 141 137  K 4.6 4.2  CL 105 103  CO2 29 26  GLUCOSE 71 104*  BUN 22 23  CREATININE 1.66* 1.69*  CALCIUM 8.9 8.6   Additional lab results reviewed. TIBC - 195 Iron - 19 Folate - 13.9 Haptoglobin - 409  XRAYS/RESULTS: Ct Abdomen Pelvis Wo Contrast  04/05/2012  *RADIOLOGY REPORT*  Clinical Data: Abdominal pain, abnormal LFTs  CT ABDOMEN AND PELVIS WITHOUT CONTRAST  Technique:  Multidetector CT imaging of the abdomen and pelvis was performed following the standard protocol without intravenous contrast.  Comparison: Ultrasound 04/03/2012  Findings: There is  bilateral moderate pleural effusions which layer dependently.  There is dense right basilar atelectasis versus pneumonia with air bronchograms.  There is passive atelectasis at the left lung base additionally.  Small pericardial effusion is noted.  Non-IV contrast of the images of the liver are unremarkable.  The gallbladder wall is mildly thickened with mild pericholecystic fluid.  This is similar to comparison ultrasound.  Pancreas, spleen, adrenal glands, and kidneys appear normal.  The stomach, small bowel, appendix, and cecum are normal.  There is moderate volume stool throughout the colon.  The rectosigmoid colon is normal.  Abdominal aorta normal caliber.  No retroperitoneal periportal lymphadenopathy.  No free fluid the pelvis.  The bladder is distended.  No pelvic lymphadenopathy.  Prostate gland is small.  There is small amount of free fluid within the pelvis which has simple fluid attenuation. Review of  bone windows demonstrates no aggressive osseous  lesions.  IMPRESSION:  1.  Large bilateral pleural effusions with associated dense basilar passive atelectasis.  Cannot exclude a component of pneumonia at the lung bases.  Extensive air bronchograms. 2.  Small pericardial effusion. 3.  Mild gallbladder wall thickening and small amount pericholecystic fluid. 4.  Small amount of free fluid in the pelvis may relate to liver dysfunction. 5.  Distended bladder.  Original Report Authenticated By: Suzy Bouchard, M.D.   Dg Chest 2 View  04/05/2012  *RADIOLOGY REPORT*  Clinical Data: Shortness of breath and chest pain.  CHEST - 2 VIEW  Comparison: 03/31/2012 chest radiograph and 03/28/2012 CT  Findings: Moderate to large bilateral pleural effusions with lower lung atelectasis again noted. There is no evidence of pneumothorax. Pulmonary vascular congestion has decreased. No other changes are identified.  IMPRESSION: Unchanged moderate to large bilateral pleural effusions and lower lung atelectasis.  Decreased pulmonary vascular congestion.  Original Report Authenticated By: Lura Em, M.D.     ASSESSMENT and PLAN: Anemia of chronic disease with iron deficiency - s/p IV iron.  Continue with oral iron.   Sophronia Simas I., MD 04/06/2012

## 2012-04-06 NOTE — Progress Notes (Signed)
ANTIBIOTIC CONSULT NOTE - FOLLOW UP  Pharmacy Consult for levofloxacin Indication: HCAP and buccal abscess  Allergies  Allergen Reactions  . Penicillins Rash    Tolerating Ceftriaxone (03/12/12)    Patient Measurements: Height: 6\' 2"  (188 cm) Weight: 201 lb 4.5 oz (91.3 kg) IBW/kg (Calculated) : 82.2  Adjusted Body Weight: Vital Signs: Temp: 98.1 F (36.7 C) (04/15 0600) Temp src: Oral (04/15 0600) BP: 155/91 mmHg (04/15 0600) Pulse Rate: 81  (04/15 0600) Intake/Output from previous day:   Intake/Output from this shift:    Labs:  Basename 04/06/12 0655 04/05/12 0556 04/04/12 0720 04/03/12 1207  WBC 7.3 9.7 7.3 --  HGB 8.2* 8.3* 7.6* --  PLT 350 339 261 --  LABCREA -- -- -- 32.13  CREATININE 1.66* 1.69* 1.60* --   Estimated Creatinine Clearance: 65.3 ml/min (by C-G formula based on Cr of 1.66). No results found for this basename: VANCOTROUGH:2,VANCOPEAK:2,VANCORANDOM:2,GENTTROUGH:2,GENTPEAK:2,GENTRANDOM:2,TOBRATROUGH:2,TOBRAPEAK:2,TOBRARND:2,AMIKACINPEAK:2,AMIKACINTROU:2,AMIKACIN:2, in the last 72 hours   Microbiology: Recent Results (from the past 720 hour(s))  CULTURE, BLOOD (ROUTINE X 2)     Status: Normal   Collection Time   03/11/12  1:00 PM      Component Value Range Status Comment   Specimen Description BLOOD RIGHT ANTECUBITAL   Final    Special Requests NONE BOTTLES DRAWN AEROBIC AND ANAEROBIC [facility]   Final    Culture  Setup Time [id number]   Final    Culture     Final    Value: STREPTOCOCCUS PNEUMONIAE     Note: SUSCEPTIBILITIES PERFORMED ON PREVIOUS CULTURE WITHIN THE LAST 5 DAYS.     Note: Gram Stain Report Called to,Read Back By and Verified With: COURTNEY DRIGGERS @ [id number] 03/12/12 [place]   Report Status 03/14/2012 FINAL   Final   CULTURE, BLOOD (ROUTINE X 2)     Status: Normal   Collection Time   03/11/12  2:10 PM      Component Value Range Status Comment   Specimen Description BLOOD RIGHT HAND   Final    Special Requests NONE BOTTLES DRAWN AEROBIC  AND ANAEROBIC [facility]   Final    Culture  Setup Time [id number]   Final    Culture     Final    Value: GRAM POSITIVE COCCI IN PAIRS     STREPTOCOCCUS PNEUMONIAE     Note: Gram Stain Report Called to,Read Back By and Verified With: COURTNEY DRIGGERS @ [id number] 03/12/12 [place]   Report Status 03/14/2012 FINAL   Final    Organism ID, Bacteria STREPTOCOCCUS PNEUMONIAE   Final   CULTURE, BLOOD (ROUTINE X 2)     Status: Normal   Collection Time   03/15/12  1:15 PM      Component Value Range Status Comment   Specimen Description BLOOD RIGHT ARM   Final    Special Requests BOTTLES DRAWN AEROBIC AND ANAEROBIC 10CC   Final    Culture  Setup Time [id number]   Final    Culture NO GROWTH 5 DAYS   Final    Report Status 03/21/2012 FINAL   Final   CULTURE, BLOOD (ROUTINE X 2)     Status: Normal   Collection Time   03/15/12  1:20 PM      Component Value Range Status Comment   Specimen Description BLOOD LEFT ARM   Final    Special Requests BOTTLES DRAWN AEROBIC AND ANAEROBIC 10CC   Final    Culture  Setup Time [id number]   Final    Culture NO GROWTH  5 DAYS   Final    Report Status 03/21/2012 FINAL   Final   MRSA PCR SCREENING     Status: Normal   Collection Time   03/16/12 11:50 AM      Component Value Range Status Comment   MRSA by PCR NEGATIVE  NEGATIVE  Final   CULTURE, BLOOD (ROUTINE X 2)     Status: Normal   Collection Time   03/28/12 12:15 AM      Component Value Range Status Comment   Specimen Description BLOOD LEFT ARM   Final    Special Requests BOTTLES DRAWN AEROBIC AND ANAEROBIC 10CC   Final    Culture  Setup Time [id number]   Final    Culture NO GROWTH 5 DAYS   Final    Report Status 04/03/2012 FINAL   Final   CULTURE, BLOOD (ROUTINE X 2)     Status: Normal   Collection Time   03/28/12 12:25 AM      Component Value Range Status Comment   Specimen Description BLOOD RIGHT ARM   Final    Special Requests BOTTLES DRAWN AEROBIC ONLY 10CC   Final    Culture  Setup Time [id number]    Final    Culture NO GROWTH 5 DAYS   Final    Report Status 04/03/2012 FINAL   Final   WOUND CULTURE     Status: Normal   Collection Time   04/02/12 12:30 PM      Component Value Range Status Comment   Specimen Description WOUND MOUTH RIGHT   Final    Special Requests ORAL MUCOSA   Final    Gram Stain     Final    Value: FEW WBC PRESENT, PREDOMINANTLY PMN     NO SQUAMOUS EPITHELIAL CELLS SEEN     NO ORGANISMS SEEN   Culture NO GROWTH 2 DAYS   Final    Report Status 04/04/2012 FINAL   Final     Anti-infectives     Start     Dose/Rate Route Frequency Ordered Stop   04/03/12 1400   levofloxacin (LEVAQUIN) IVPB 750 mg        750 mg 100 mL/hr over 90 Minutes Intravenous Every 24 hours 04/03/12 1155     04/03/12 1200   metroNIDAZOLE (FLAGYL) IVPB 500 mg        500 mg 100 mL/hr over 60 Minutes Intravenous Every 8 hours 04/03/12 1145 04/04/12 1159   04/03/12 1200   linezolid (ZYVOX) tablet 600 mg        600 mg Oral Every 12 hours 04/03/12 1145     03/31/12 2000   piperacillin-tazobactam (ZOSYN) IVPB 3.375 g  Status:  Discontinued        3.375 g 12.5 mL/hr over 240 Minutes Intravenous Every 8 hours 03/31/12 1841 04/03/12 1145   03/28/12 1000   vancomycin (VANCOCIN) IVPB 1000 mg/200 mL premix  Status:  Discontinued        1,000 mg 200 mL/hr over 60 Minutes Intravenous Every 8 hours 03/28/12 0715 04/03/12 1008   03/28/12 1000   piperacillin-tazobactam (ZOSYN) IVPB 3.375 g  Status:  Discontinued        3.375 g 12.5 mL/hr over 240 Minutes Intravenous 3 times per day 03/28/12 0726 03/31/12 1841   03/28/12 0200   vancomycin (VANCOCIN) IVPB 1000 mg/200 mL premix        1,000 mg 200 mL/hr over 60 Minutes Intravenous  Once 03/28/12 0148 03/28/12 0413   03/28/12 0200  piperacillin-tazobactam (ZOSYN) IVPB 3.375 g        3.375 g 12.5 mL/hr over 240 Minutes Intravenous  Once 03/28/12 0148 03/28/12 [id number]          Assessment: 46 year old on levaquin, flagyl and zyvox for HCAP and buccal  abscess.  His CrCl ~ 65.  Plan:  1. Continue Levofloxacin 750 mg IV q24 for HCAP. 2. Zyvox 600 mg po bid per MD 3. Flagyl 500 mg IV q8h per MD  Excell Seltzer, PharmD [phone] 04/06/2012 8:18 AM

## 2012-04-06 NOTE — Progress Notes (Signed)
[name] [name] CSN:[id number],MRN:[id number] is a 46 y.o. male,  Outpatient Primary MD for the patient is [name], MD, MD  Chief Complaint  Patient presents with  . Emesis        Subjective:    [name] [name] today has, No headache, No chest pain, No abdominal pain - No Nausea, No new weakness tingling or numbness, No Cough, he continues to have some right-sided pressure and exertional SOB . On 04/05/2012 He is complaining of left eye purple colored squeakily worm on his left eye vision field (10" O clock position) does not report of any vision loss or weakness or headache.   Objective:   Filed Vitals:   04/05/12 2129 04/05/12 2215 04/06/12 0600 04/06/12 0850  BP:  165/95 155/91   Pulse:  97 81   Temp:  98.7 F (37.1 C) 98.1 F (36.7 C)   TempSrc:  Oral Oral   Resp:  20 20   Height:      Weight:      SpO2: 88% 89% 89% 88%    Wt Readings from Last 3 Encounters:  04/04/12 91.3 kg (201 lb 4.5 oz)  04/04/12 91.3 kg (201 lb 4.5 oz)  04/04/12 91.3 kg (201 lb 4.5 oz)    No intake or output data in the 24 hours ending 04/06/12 1254  Exam Awake Alert, Oriented *3, No new F.N deficits, Normal affect [place].AT,PERRAL. Right-sided facial swelling. Supple Neck,No JVD, No cervical lymphadenopathy appriciated.  Symmetrical Chest wall movement, Good air movement bilaterally, diminished breath sounds bilaterally with some bibasilar Rales RRR,No Gallops,Rubs or new Murmurs, No Parasternal Heave +ve B.Sounds, Abd Soft, Non tender, No organomegaly appriciated, No rebound -guarding or rigidity. No Cyanosis, Clubbing , 1+edema, No new Rash or bruise      Data Review   CBC  Lab 04/06/12 0655 04/05/12 0556 04/04/12 0720 04/03/12 0953 04/02/12 0605 04/01/12 0630  WBC 7.3 9.7 7.3 -- 11.0* 9.1  HGB 8.2* 8.3* 7.6* 8.5* 7.6* --  HCT 25.2* 25.5* 23.2* 26.4* 23.2* --  PLT 350 339 261 -- 271 313  MCV 88.4 88.5 87.2 -- 87.9 87.4  MCH 28.8 28.8 28.6 -- 28.8 28.7  MCHC 32.5 32.5  32.8 -- 32.8 32.8  RDW 14.6 14.5 14.6 -- 15.1 15.4  LYMPHSABS -- -- -- -- -- --  MONOABS -- -- -- -- -- --  EOSABS -- -- -- -- -- --  BASOSABS -- -- -- -- -- --  BANDABS -- -- -- -- -- --    Chemistries   Lab 04/06/12 0655 04/05/12 0556 04/04/12 0720 04/03/12 1120 04/03/12 0630 04/01/12 0630 03/31/12 0500  NA 141 137 139 137 132* -- --  K 4.6 4.2 3.9 4.1 4.1 -- --  CL 105 103 102 97 95* -- --  CO2 29 26 29 29 28  -- --  GLUCOSE 71 104* 116* 88 240* -- --  BUN 22 23 25* 27* 27* -- --  CREATININE 1.66* 1.69* 1.60* 1.54* 1.66* -- --  CALCIUM 8.9 8.6 8.4 8.7 8.5 -- --  MG -- -- -- -- -- -- 1.9  AST 29 -- 69* 132* 161* 46* --  ALT 61* -- 101* 141* 128* 91* --  ALKPHOS 1010* -- 1151* 1491* 1356* 714* --  BILITOT 0.5 -- 0.4 0.6 0.6 0.5 --   ------------------------------------------------------------------------------------------------------------------ estimated creatinine clearance is 65.3 ml/min (by C-G formula based on Cr of 1.66). ------------------------------------------------------------------------------------------------------------------ No results found for this basename: HGBA1C:2 in the last 72 hours ------------------------------------------------------------------------------------------------------------------ No results found for  this basename: CHOL:2,HDL:2,LDLCALC:2,TRIG:2,CHOLHDL:2,LDLDIRECT:2 in the last 72 hours ------------------------------------------------------------------------------------------------------------------ No results found for this basename: TSH,T4TOTAL,FREET3,T3FREE,THYROIDAB in the last 72 hours ------------------------------------------------------------------------------------------------------------------  [facility] 04/04/12 1210  VITAMINB12 679  FOLATE 13.9  FERRITIN 1235*  TIBC 195*  IRON 19*  RETICCTPCT 1.7    Coagulation profile No results found for this basename: INR:5,PROTIME:5 in the last 168 hours  No results found for this  basename: DDIMER:2 in the last 72 hours  Cardiac Enzymes No results found for this basename: CK:3,CKMB:3,TROPONINI:3,MYOGLOBIN:3 in the last 168 hours ------------------------------------------------------------------------------------------------------------------ No components found with this basename: POCBNP:3  Micro Results Recent Results (from the past 240 hour(s))  CULTURE, BLOOD (ROUTINE X 2)     Status: Normal   Collection Time   03/28/12 12:15 AM      Component Value Range Status Comment   Specimen Description BLOOD LEFT ARM   Final    Special Requests BOTTLES DRAWN AEROBIC AND ANAEROBIC 10CC   Final    Culture  Setup Time [id number]   Final    Culture NO GROWTH 5 DAYS   Final    Report Status 04/03/2012 FINAL   Final   CULTURE, BLOOD (ROUTINE X 2)     Status: Normal   Collection Time   03/28/12 12:25 AM      Component Value Range Status Comment   Specimen Description BLOOD RIGHT ARM   Final    Special Requests BOTTLES DRAWN AEROBIC ONLY 10CC   Final    Culture  Setup Time [id number]   Final    Culture NO GROWTH 5 DAYS   Final    Report Status 04/03/2012 FINAL   Final   WOUND CULTURE     Status: Normal   Collection Time   04/02/12 12:30 PM      Component Value Range Status Comment   Specimen Description WOUND MOUTH RIGHT   Final    Special Requests ORAL MUCOSA   Final    Gram Stain     Final    Value: FEW WBC PRESENT, PREDOMINANTLY PMN     NO SQUAMOUS EPITHELIAL CELLS SEEN     NO ORGANISMS SEEN   Culture NO GROWTH 2 DAYS   Final    Report Status 04/04/2012 FINAL   Final     Radiology Reports Dg Chest 2 View  03/28/2012  *RADIOLOGY REPORT*  Clinical Data: Cough, right rib pain, emesis, recent pneumonia  CHEST - 2 VIEW  Comparison: 03/16/2012  Findings: Patchy right lower lobe opacity, suspicious for pneumonia, with associated small-to-moderate right pleural effusion.  Left basilar opacity, atelectasis versus pneumonia, with suspected small left pleural effusion.  No  pneumothorax.  The heart is normal in size.  Interval removal of right subclavian PICC.  Visualized osseous structures are within normal limits.  IMPRESSION: Patchy right lower lobe opacity, suspicious for pneumonia, with associated small-to-moderate right pleural effusion.  Left basilar opacity, atelectasis versus pneumonia, with suspected small left pleural effusion.  Original Report Authenticated By: Julian Hy, M.D.   Dg Chest 2 View  03/16/2012  *RADIOLOGY REPORT*  Clinical Data: History of pneumonia.  CHEST - 2 VIEW  Comparison: 03/11/2012.  Findings: Tip of right PICC terminates in right atrial region.  No pneumothorax is seen.  There is some atelectasis and infiltrate in the medial posterior aspect of the left lung.  There are atelectasis and infiltrative densities seen within the right perihilar region and within the right midlung and right lower lung areas with consolidation and atelectasis.  There is improvement in  the amount of infiltrate compared to previous study.   On the lateral image there is continued abnormal opacity inferiorly and posteriorly.  Indistinctness of costophrenic angles may reflect an element of pleural effusion.  IMPRESSION: Right PICC in place.  Some atelectasis and infiltrative density in the medial posterior left lung inferiorly. here are atelectasis and infiltrative densities seen within the right perihilar region and within the right midlung and right lower lung areas with consolidation and atelectasis.  There is improvement in the amount of infiltrative compared to previous study but clearing has not occurred. Question small amounts of pleural effusion.  Original Report Authenticated By: Delane Ginger, M.D.   Dg Chest 2 View  03/11/2012  *RADIOLOGY REPORT*  Clinical Data: Shortness of breath with right lower chest pain and fever.  CHEST - 2 VIEW  Comparison: None.  Findings: Trachea is midline.  Heart size normal.  There is dense airspace consolidation in the right  lower lobe.  Question mild left lower lobe air space disease.  No left pleural fluid.  IMPRESSION:  1.  Dense airspace consolidation in the right lower lobe is most consistent with pneumonia.  Follow-up to clearing is recommended, to exclude a centrally obstructing mass. 2.  Question mild left lower lobe air space disease.  Original Report Authenticated By: Luretha Rued, M.D.   Ct Chest Wo Contrast  03/28/2012  *RADIOLOGY REPORT*  Clinical Data: Cough, right-sided chest pain  CT CHEST WITHOUT CONTRAST  Technique:  Multidetector CT imaging of the chest was performed following the standard protocol without IV contrast.  Comparison: Chest radiographs dated 03/28/2012.  CT chest dated 03/11/2012.  Findings: Patchy posterior right upper lobe opacity and near complete right lower lobe consolidation, suspicious for pneumonia. Associated moderate right pleural effusion.  Patchy left lower lobe opacity, atelectasis versus pneumonia, with associated small left pleural effusion.  No pneumothorax.  Heart is normal in size.  Small pericardial effusion.  Visualized upper abdomen is unremarkable.  Mild degenerative changes of the visualized thoracolumbar spine.  IMPRESSION: Right upper/lower lobe pneumonia, with associated moderate right pleural effusion.  Patchy left lower lobe opacity, atelectasis versus pneumonia, with associated small left pleural effusion.  Original Report Authenticated By: Julian Hy, M.D.   Ct Angio Chest W/cm &/or Wo Cm  03/11/2012  *RADIOLOGY REPORT*  Clinical Data: Fever.  Cough.  Weakness.  Short of breath.  CT ANGIOGRAPHY CHEST  Technique:  Multidetector CT imaging of the chest using the standard protocol during bolus administration of intravenous contrast. Multiplanar reconstructed images including MIPs were obtained and reviewed to evaluate the vascular anatomy.  Contrast: 9mL OMNIPAQUE IOHEXOL 350 MG/ML IV SOLN  Comparison: Chest radiography same day  Findings: Pulmonary arterial  opacification is excellent.  There are no pulmonary emboli.  There is consolidative pneumonia throughout the right lower lobe with minor involvement of the right upper lobe.  There is consolidative pneumonia less extensively in the posterior aspect of the left lower lobe.  There is a small amount of pleural fluid on the right.  There is a tiny amount of pericardial fluid.  There are small mediastinal lymph nodes, likely reactive.  No upper abdominal pathology is seen.  IMPRESSION: No pulmonary emboli.  Consolidative pneumonia throughout the right lower lobe.  Partial involvement of the left lower lobe by consolidative  pneumonia. Minimal involvement of the right upper lobe.  Original Report Authenticated By: Jules Schick, M.D.   Nm Hepatobiliary  03/14/2012  *RADIOLOGY REPORT*  Clinical Data: Abdominal pain, evaluate for  cholecystitis  NUCLEAR MEDICINE HEPATOHBILIARY INCLUDE GB  Radiopharmaceutical:  5.5 mCi technetium 33m Choletec  Comparison: Ultrasound dated 03/12/2012.  Findings: Normal hepatic uptake.  Small bowel is visible within 20 minutes.  Gallbladder is visible within 30 minutes, confirming cystic duct patency.  IMPRESSION: Normal hepatobiliary nuclear medicine scan.  Original Report Authenticated By: Julian Hy, M.D.   [place] Chest  03/28/2012  *RADIOLOGY REPORT*  Clinical Data: 46 year old male with pneumonia and pleural effusions on chest CT.  Assess for thoracentesis.  CHEST ULTRASOUND  Comparison: Chest CT without contrast 03/28/2012 and earlier.  Findings: Gray-scale imaging of the right hemithorax demonstrates a small component of pleural fluid, with a larger adjacent component of consolidated lung.  No free fluid seen subjacent to the right hemidiaphragm.  The quantity of pleural fluid identified is insufficient for thoracentesis.  IMPRESSION: Right pleural effusion and larger component of right lung consolidation on ultrasound.  Pleural fluid component quantity insufficient for  thoracentesis at this time.  Original Report Authenticated By: Randall An, M.D.   US Abdomen Complete  03/28/2012  *RADIOLOGY REPORT*  Clinical Data:  Elevated LFTs.  Right upper quadrant pain and nausea.  ABDOMINAL ULTRASOUND COMPLETE  Comparison:  03/12/2012  Findings:  Gallbladder:  No gallstones, gallbladder wall thickening, or pericholecystic fluid. Negative sonographic Murphy's sign reported by the sonographer.  Common Bile Duct:  Within normal limits in caliber.  Liver: No focal mass lesion identified.  Within normal limits in parenchymal echogenicity.  IVC:  Appears normal.  Pancreas:  No abnormality identified.  Spleen:  Within normal limits in size and echotexture.  Right kidney:  Normal in size and parenchymal echogenicity.  No evidence of mass or hydronephrosis.  Left kidney:  Normal in size and parenchymal echogenicity.  No evidence of mass or hydronephrosis.  Abdominal Aorta:  No aneurysm identified.  Other findings:  Small bilateral pleural effusions.  Small amount of upper abdominal ascites posterior to the liver and spleen.  IMPRESSION:  1.  Small amount of abdominal ascites. 2.  Small pleural effusions.  Original Report Authenticated By: Angelita Ingles, M.D.   US Abdomen Complete  03/12/2012  *RADIOLOGY REPORT*  Clinical Data:  Right upper quadrant abdominal pain, elevated liver function tests  ABDOMINAL ULTRASOUND COMPLETE  Comparison:  Chest CT 03/11/2012 with incomplete visualization of the liver  Findings:  Gallbladder:  Minimal dependent sludge is noted within the gallbladder.  The gallbladder is not distended, with gallbladder wall thickness measuring borderline thickened at 4 mm.  No shadowing calculus or sonographic Murphy's sign noted.  Common Bile Duct:  Within normal limits in caliber.  Liver: No focal mass lesion identified.  Within normal limits in parenchymal echogenicity.  IVC:  Appears normal.  Pancreas:  No abnormality identified.  Spleen:  Within normal limits in size  and echotexture.  Right kidney:  Normal in size and parenchymal echogenicity.  No evidence of mass or hydronephrosis.  Left kidney:  Normal in size and parenchymal echogenicity.  No evidence of mass or hydronephrosis.  Abdominal Aorta:  Small amount of ascites is noted.  Trace left pleural effusion.  IMPRESSION: Minimal sludge within the gallbladder with borderline wall thickening.  This could indicate cholecystitis in the appropriate clinical context.  Trace ascites.  Original Report Authenticated By: Arline Asp, M.D.   Ct Maxillofacial W/cm  04/01/2012  *RADIOLOGY REPORT*  Clinical Data: Swelling on the right for several weeks.  Jaw pain.  CT MAXILLOFACIAL WITH CONTRAST  Technique:  Multidetector CT imaging of the  maxillofacial structures was performed with intravenous contrast. Multiplanar CT image reconstructions were also generated.  Contrast: 55mL OMNIPAQUE IOHEXOL 300 MG/ML  SOLN  Comparison: None.  Findings: In the right buccal region, there is a complex fluid collection spanning over 5 x 2.1 x 1.1 cm.  This is immediately adjacent to the significant dental disease.  Mild cervical spondylotic changes.  Visualized mastoid air cells, middle ear cavities and paranasal sinuses are clear.  IMPRESSION: Probable abscess right buccal region most likely dental in origin given the degree of significant caries and periapical lucency of the molars.  Results called to Bari Mantis patient's nurse 04/01/2012 3:20 a.m. with request to call report first in the morning.  Original Report Authenticated By: Doug Sou, M.D.   Dg Chest Port 1 View  03/31/2012  *RADIOLOGY REPORT*  Clinical Data: Assess infiltrate.  PORTABLE CHEST - 1 VIEW  Comparison: 03/28/2012  Findings: Very low lung volumes.  Worsening bibasilar opacities and probable edema.  Bilateral effusions.  Cardiomegaly.  IMPRESSION: Increasing pulmonary edema, bibasilar opacities and effusions. Very low lung volumes.  Original Report Authenticated By:  Raelyn Number, M.D.    Echo  - Left ventricle: The cavity size was normal. Wall thickness was increased in a pattern of mild LVH. Systolic function was normal. The estimated ejection fraction was in the range of 55% to 60%. - Atrial septum: No defect or patent foramen ovale was identified. - Pericardium, extracardiac: Small pericardial effusion with no evidence of tamponade    Scheduled Meds:    . albuterol  2.5 mg Nebulization Q6H  . amLODipine  5 mg Oral Daily  . atorvastatin  20 mg Oral q1800  . docusate sodium  100 mg Oral BID  . ferrous sulfate  325 mg Oral BID WC  . guaiFENesin  1,200 mg Oral BID  . insulin aspart  0-9 Units Subcutaneous TID WC  . insulin glargine  20 Units Subcutaneous QHS  . lidocaine  2 patch Transdermal Q24H  . linezolid  600 mg Oral Q12H  . metoprolol tartrate  50 mg Oral BID  . senna  1 tablet Oral BID  . sodium chloride  3 mL Intravenous Q12H  . DISCONTD: levofloxacin (LEVAQUIN) IV  750 mg Intravenous Q24H   Continuous Infusions:   PRN Meds:.sodium chloride, acetaminophen, acetaminophen, HYDROcodone-acetaminophen, morphine, ondansetron (ZOFRAN) IV, ondansetron, oxyCODONE, sodium chloride  Assessment & Plan   Brief narrative:  46 year old man recently treated for streptococcus pneumoniae bacteremia and pneumonia , anemia, right-sided facial swelling and elevated liver enzymes along with acute renal failure during his first hospitalization about 2 weeks ago was admitted on 03/12/2012 and discharged on 03/17/2012, patient had received a PICC line during his first admission and was then discharged on IV Rocephin to home as recommended by ID physician Dr. Linus Salmons.  He presented back to the hospital 03/28/2010 with fever and chills, this time he was diagnosed with buccal abscess, along with bilateral pleural effusions with possible right-sided parapneumonic effusion, persistently elevated liver enzymes despite negative hepatitis panel , mild  gallbladder sludge on ultrasound but with negative HIDA, he was being seen by GI, pulmonary, ID, renal, hematology.  His antibiotics have been switched around by infectious disease physicians, he continues to have moderate to severe anemia probably anemia of chronic disease along with anemia of acute disease in combination with iron deficiency worsened by frequent lab draws, his iron has been replaced intravenously and now is on oral supplements, has been seen by hematology and is being  followed up by them, GI has ordered further immunological testing for elevated transaminases, pulmonary has evaluated him several times for his right-sided parapneumonic effusion however effusion size is too small to be tapped who on ultrasound, He has redeveloped mild non-oliguric acute renal insufficiency again with negative ultrasound, this is likely due to vancomycin and anemia, vancomycin has been stopped patient is improving post gentle IV fluids which will be continued. His edema has improved thereafter Lasix has been stopped also.  He also had left I visual field defect i.e. loose body in his left eye for which he was seen by ophthalmologist Dr.Tanner who diagnosed him with Proliferative diabetic retinopathy with macular edema and vitreous hemorrhage and will need continued outpatient IV followup along with aggressive risk factor modification i.e. Diabetes, hypertension dyslipidemia.    Past medical history:  Diabetes mellitus    Consultations:  Pulmonology Gastroenterology ENT  Oral Surgery Physical therapy: Home health Hematology [facility]   Procedures:  April 9: Upper endoscopy: LA grade C esophagitis. Protonix 40 mg by mouth twice a day- Dr Benson [place] April 10: Colonoscopy - Dr Collene Mares April 11: I&D of Rt Parotid Abscess - Dr Hoyt Koch   Antibiotics:  April 6: Vancomycin  April 6: Zosyn ABX on April 12th switched to Zyvox-Levaquin-Flagyl      #1. 46 y.o. male admitted on 03/27/2012, with  recurrent pneumonia along with pleural effusion concerning for empyema, recent admission for Streptococcus pneumoniae bacteremia and was discharged on IV Rocephin, now comes back with fever chills. ID now changed ABX to Zyvox-Lavaquin and Falgyl due to ? Marrow suppression and ARF on 12th, D/W Dr Wendie Agreste, will monitor. Patient continues to have quite a bit of oxygen demand along with exertional shortness of breath, off note CT abdomen done on 04/05/2012 by [facility]em-Onc shows significantly large amounts of bilateral pleural effusions, I have personally called pulmonary physician Dr. Leonidas [name] on 04-06-12 to reevaluate the patient from the standpoint of large pleural effusions.    #2.Mixed pattern elevated LFTs: Etiology not clear. Abdominal ultrasound unremarkable on the 6th, hepatitis panel negative and recent HIDA scan was negative. ?? Possibly related to effusion. Has bumped up again on 12th, GI following patient, repeat US shows evidence of gallbladder sludge I discussed the case personally with surgeon on call Dr. Sharlett Iles who recommended no surgical intervention at this time as there is no sign of acute cholecystitis on exam or on the ultrasound, with recent negative HIDA scan, echogram shows mild LV hypertrophy but no frank acute heart failure.    #3. Hyponatremia: Stable. Likely related to current pneumonia/pulmonary process. Resolved     #4. Anemia: Likely combination of anemia of chronic disease, iron deficiency, anemia acute disease. No history of bleeding. Fecal occult blood negative. No previous data prior to last hospitalization when he presented with hemoglobin of 10 which quickly decreased, probably from dilution also could be a medication effect. His anemia panel does show severely low iron although ferritin is stable (however it is a acute phase reactant) . Appreciate gastroenterology evaluation, EGD showing grade C. esophagitis placed on PPI, limited colonoscopy also unremarkable, stable  post 1 unit on 11th, ? Marrow suppression from Zosyn + AOAD  and iron deficiency, IV iron being given on recommendation of hematology we'll continue to monitor H&H hematology follow.    #5. Diabetes mellitus type 2, uncontrolled: Continue sliding scale insulin, Lantus, meal coverage, due to acute renal insufficiency patient's insulin dose has been reduced on 04/05/2012, few episodes of borderline hypoglycemia were noted.  Lab Results  Component Value Date   HGBA1C 15.7* 03/12/2012     CBG (last 3)   Basename 04/06/12 1204 04/06/12 0701 04/05/12 2222  GLUCAP 133* 70 146*      #6. Right buccal mucosa mass: This developed during his last hospitalization. Was seen by ENT and oral surgery, underwent I&D of a right parotid abscess by Dr. Hoyt Koch on 04/02/2012, antibiotics as #1 above.  .   #7. Acute renal failure with edema - renal failure worse today along with LFTs, could have been due to combination of vancomycin and anemia, [place] stable, FeNa 1.37, urine does show mild proteinuria and since patient is diabetic we'll try to place him on ACE. but once creatinine is stable will need outpatient renal follow up. Continue to hold lasix, post gentle IVF, vancomycin stopped 03-03-12. Have discussed the case on 04/05/2012 with Dr. Jonnie Finner nephrologist on call who does not recommend any change in  plan of care at this time, most likely this ATN which was improved in due course of time. Patient likely has underlying CKD due to his diabetes which was an extremely poor control as evidenced by his A1c of 15.7. We'll continue to monitor     #8. Tubular adenoma on colonic polyp biopsy outpatient GI followup.     #9. Left eye 10:00 position sensation of purple colored loose body which per patient started yesterday evening. With no vision loss, no focal neurological deficit, no eye pain or headache. Have discussed it with Dr. Satira Sark patient was seen by him on 04/05/2012 and was diagnosed withProliferative  diabetic retinopathy with macular edema and vitreous hemorrhage, patient will need outpatient retina specialist followup along with risk factor modification with strict control of diabetes mellitus type 2, hypertension, dyslipidemia.     DVT Prophylaxis  SCDs      Thurnell Lose M.D on 04/06/2012 at 12:54 PM  [facility] Office  [phone]

## 2012-04-06 NOTE — Progress Notes (Signed)
Patient: [name] [name] DOB: 28-Jul-1966 Date of Admission: 03/27/2012 PCP - [name], MD            [facility]  MD requesting consult:  [facility] Sarajane Jews) Reason for consult: Recurrent PNA  HPI - 46yo male with hx poorly controlled DM with recent admission for PNA and strep bacteremia c/b acute renal failure. He was d/c 3/25 with PICC for Ceftriaxone through 4/2.  He returned 4/6 with cough, SOB and R sided pleuritic chest pain.  Re-admitted by [facility] with recurrent PNA, R>L pleural effusions, DM and anemia.  PCCM consulted 4/8 for assist and ?FOB.   Culture data:  4/6: BCX2>>>neg 4/11 mouth I&D>>>neg  abx Zosyn 4/6>>>4/12 vanc 4/6>>>4/12 Levaquin (PNA - per ID) 4/12>>> Zyvox (PNA - per ID) 4/12>>>  Subjective: Denies SOB.  Still with some mild pleuritic chest pain with inspiration.    BP 155/91  Pulse 81  Temp(Src) 98.1 F (36.7 C) (Oral)  Resp 20  Ht 6\' 2"  (1.88 m)  Wt 201 lb 4.5 oz (91.3 kg)  BMI 25.84 kg/m2  SpO2 88% 2 liters  EXAM: General: pleasant male, NAD OOB in chair  HEENT: mm moist, R buccal mucosa mass improved Neuro: awake, alert, appropriate CV: s1s2 rrr PULM: resps even, nonlabored, CTA apices, diminished R>L base GI: abd soft, non tender, +bs Extremities:  Warm and dry, 1-2+ BLE edema, pale   chest X-ray Ct Abdomen Pelvis Wo Contrast  04/05/2012  *RADIOLOGY REPORT*  Clinical Data: Abdominal pain, abnormal LFTs  CT ABDOMEN AND PELVIS WITHOUT CONTRAST  Technique:  Multidetector CT imaging of the abdomen and pelvis was performed following the standard protocol without intravenous contrast.  Comparison: Ultrasound 04/03/2012  Findings: There is  bilateral moderate pleural effusions which layer dependently.  There is dense right basilar atelectasis versus pneumonia with air bronchograms.  There is passive atelectasis at the left lung base additionally.  Small pericardial effusion is noted.  Non-IV contrast of the images of the liver are  unremarkable.  The gallbladder wall is mildly thickened with mild pericholecystic fluid.  This is similar to comparison ultrasound.  Pancreas, spleen, adrenal glands, and kidneys appear normal.  The stomach, small bowel, appendix, and cecum are normal.  There is moderate volume stool throughout the colon.  The rectosigmoid colon is normal.  Abdominal aorta normal caliber.  No retroperitoneal periportal lymphadenopathy.  No free fluid the pelvis.  The bladder is distended.  No pelvic lymphadenopathy.  Prostate gland is small.  There is small amount of free fluid within the pelvis which has simple fluid attenuation. Review of  bone windows demonstrates no aggressive osseous lesions.  IMPRESSION:  1.  Large bilateral pleural effusions with associated dense basilar passive atelectasis.  Cannot exclude a component of pneumonia at the lung bases.  Extensive air bronchograms. 2.  Small pericardial effusion. 3.  Mild gallbladder wall thickening and small amount pericholecystic fluid. 4.  Small amount of free fluid in the pelvis may relate to liver dysfunction. 5.  Distended bladder.  Original Report Authenticated By: Suzy Bouchard, M.D.   Dg Chest 2 View  04/05/2012  *RADIOLOGY REPORT*  Clinical Data: Shortness of breath and chest pain.  CHEST - 2 VIEW  Comparison: 03/31/2012 chest radiograph and 03/28/2012 CT  Findings: Moderate to large bilateral pleural effusions with lower lung atelectasis again noted. There is no evidence of pneumothorax. Pulmonary vascular congestion has decreased. No other changes are identified.  IMPRESSION: Unchanged moderate to large bilateral pleural effusions and lower lung atelectasis.  Decreased pulmonary vascular congestion.  Original Report Authenticated By: Lura Em, M.D.    CT chest 4/6>>>Right upper/lower lobe pneumonia, with associated moderate right pleural effusion. Patchy left lower lobe opacity, atelectasis versus pneumonia, with associated small left pleural effusion.  No obvious endobronchial lesion  Bedside US exam 4/12: right chest examined at bedside. Very little pleural fluid. Mostly consolidated lung. Fluid collections are minimal and not amendable to draining.  Pictures in chart.   Lab 04/06/12 0655 04/05/12 0556 04/04/12 0720  NA 141 137 139  K 4.6 4.2 3.9  CL 105 103 102  CO2 29 26 29   BUN 22 23 25*  CREATININE 1.66* 1.69* 1.60*  GLUCOSE 71 104* 116*    Lab 04/06/12 0655 04/05/12 0556 04/04/12 0720  HGB 8.2* 8.3* 7.6*  HCT 25.2* 25.5* 23.2*  WBC 7.3 9.7 7.3  PLT 350 339 261       IMPRESSION/ PLAN:  1. Recurrent PNA -- Recent strep pneumoniae PNA and bacteremia.  D/c home 3/25 with cont IV ceftriaxone through 4/2 per ID.  Worsened 4/6.  Persistent R sided PNA with associated effusion.  This could reflect insufficient treatment time given the severity of the PNA, possible empyema or abscess development. Does increase concern for underlying malignancy and 20 lbs wt loss unexplained! No clear endobronchial lesion or abscess on CT scan 4/6, he has small right effusion. Not enough fluid to tap. There is marked consolidation of the right lung by bedside US 4/12.  Lab 04/06/12 0655 04/05/12 0556 04/04/12 0720 04/02/12 0605 04/01/12 0630  WBC 7.3 9.7 7.3 11.0* 9.1  PLAN -  Cont IV abx (now on zyvox, levaquin per ID) Consider longer abx course, transition to oral regimen (soon), then repeat CT scan chest in several weeks to determine whether he will need FOB. If he worsens clinically, then will proceed to FOB sooner Cont pulmonary hygiene and analgesia to assist w/ cough deep breath efforts.  Consider repeat bedside u/s 4/15 to eval effusion -- ?? Effusion v empyema v consolidated lung.  ??enough fluid for diagnostic tap alone  2. Pleural effusions -- likely parapneumonic but small and not large enough to tap by bedside US on 4/12   PLAN -  Cont abx - see above Consider repeat u/s 4/15  3. Anemia - Per primary.  S/p colonoscopy 4/10, finding:   right colon polyp, which was removed.   Lab 04/06/12 0655 04/05/12 0556 04/04/12 0720 04/03/12 0953 04/02/12 0605  HGB 8.2* 8.3* 7.6* 8.5* 7.6*  plan: Hold NSAIDS Regular diet Trend CBC Iron per heme/onc   4. DM - per primary , does he need gastroparesis treatment, work up?  5. Transaminitis - per primary  Lab 04/06/12 0655 04/04/12 0720 04/03/12 1120 04/03/12 0630 04/01/12 0630  AST 29 69* 132* 161* 46*  ALT 61* 101* 141* 128* 91*  ALKPHOS 1010* 1151* 1491* 1356* 714*  BILITOT 0.5 0.4 0.6 0.6 0.5  PROT 6.7 6.5 6.5 6.7 6.8  ALBUMIN 2.0* 1.8* 1.9* 1.8* 2.0*  INR -- -- -- -- --   6. Buccal abscess s/p I&D on 4/11.  Cultures negative  Plan abx per ID   [facility], NP 04/06/2012  10:04 AM Pager: [phone]  *Care during the described time interval was provided by me and/or other providers on the critical care team. I have reviewed this patient's available data, including medical history, events of note, physical examination and test results as part of my evaluation.  Patient with severe pleuritic chest pain.  Concern for parapneumonic effusion vs empyema.  Fluid present in pleural space but minimal.  Patient not behaving in enough of a toxic manner to suggest placement of chest tube at this time.  Will thora today and depending on fluid analysis will address tomorrow by calling CVTS or placement of a chest tube.  Patient seen and examined, agree with above note.  I dictated the care and orders written for this patient under my direction.  Jennet Maduro, M.D. [phone]

## 2012-04-06 NOTE — Progress Notes (Signed)
Subjective: Pt having difficulty beathing and right sided chest pain   Antibiotics:  Anti-infectives     Start     Dose/Rate Route Frequency Ordered Stop   04/03/12 1400   levofloxacin (LEVAQUIN) IVPB 750 mg        750 mg 100 mL/hr over 90 Minutes Intravenous Every 24 hours 04/03/12 1155     04/03/12 1200   metroNIDAZOLE (FLAGYL) IVPB 500 mg        500 mg 100 mL/hr over 60 Minutes Intravenous Every 8 hours 04/03/12 1145 04/04/12 1159   04/03/12 1200   linezolid (ZYVOX) tablet 600 mg        600 mg Oral Every 12 hours 04/03/12 1145     03/31/12 2000   piperacillin-tazobactam (ZOSYN) IVPB 3.375 g  Status:  Discontinued        3.375 g 12.5 mL/hr over 240 Minutes Intravenous Every 8 hours 03/31/12 1841 04/03/12 1145   03/28/12 1000   vancomycin (VANCOCIN) IVPB 1000 mg/200 mL premix  Status:  Discontinued        1,000 mg 200 mL/hr over 60 Minutes Intravenous Every 8 hours 03/28/12 0715 04/03/12 1008   03/28/12 1000   piperacillin-tazobactam (ZOSYN) IVPB 3.375 g  Status:  Discontinued        3.375 g 12.5 mL/hr over 240 Minutes Intravenous 3 times per day 03/28/12 0726 03/31/12 1841   03/28/12 0200   vancomycin (VANCOCIN) IVPB 1000 mg/200 mL premix        1,000 mg 200 mL/hr over 60 Minutes Intravenous  Once 03/28/12 0148 03/28/12 0413   03/28/12 0200  piperacillin-tazobactam (ZOSYN) IVPB 3.375 g       3.375 g 12.5 mL/hr over 240 Minutes Intravenous  Once 03/28/12 0148 03/28/12 0626          Medications: Scheduled Meds:    . albuterol  2.5 mg Nebulization Q6H  . amLODipine  5 mg Oral Daily  . aspirin EC  81 mg Oral Once  . atorvastatin  20 mg Oral q1800  . docusate sodium  100 mg Oral BID  . ferrous sulfate  325 mg Oral BID WC  . guaiFENesin  1,200 mg Oral BID  . insulin aspart  0-9 Units Subcutaneous TID WC  . insulin glargine  20 Units Subcutaneous QHS  . levofloxacin (LEVAQUIN) IV  750 mg Intravenous Q24H  . lidocaine  2 patch Transdermal Q24H  . linezolid  600 mg  Oral Q12H  . metoprolol tartrate  50 mg Oral BID  . senna  1 tablet Oral BID  . sodium chloride  3 mL Intravenous Q12H  . DISCONTD: insulin aspart  7 Units Subcutaneous TID AC  . DISCONTD: insulin glargine  30 Units Subcutaneous QHS   Continuous Infusions:  PRN Meds:.sodium chloride, acetaminophen, acetaminophen, HYDROcodone-acetaminophen, morphine, ondansetron (ZOFRAN) IV, ondansetron, oxyCODONE, sodium chloride   Objective: Weight change:  No intake or output data in the 24 hours ending 04/06/12 1116 Blood pressure 155/91, pulse 81, temperature 98.1 F (36.7 C), temperature source Oral, resp. rate 20, height 6\' 2"  (1.88 m), weight 201 lb 4.5 oz (91.3 kg), SpO2 88.00%. Temp:  [97.4 F (36.3 C)-98.7 F (37.1 C)] 98.1 F (36.7 C) (04/15 0600) Pulse Rate:  [81-97] 81  (04/15 0600) Resp:  [20] 20  (04/15 0600) BP: (143-165)/(80-95) 155/91 mmHg (04/15 0600) SpO2:  [88 %-95 %] 88 % (04/15 0850)  Physical Exam: General: Alert and awake, oriented x3, not in any acute distress. HEENT: anicteric sclera, pupils reactive to  light and accommodation, EOMI CVS regular rate, normal r,  no murmur rubs or gallops Chest: clear to auscultation bilaterally, no wheezing, rales or rhonchi Abdomen: soft nontender, nondistended, normal bowel sounds, Extremities: no  clubbing or edema noted bilaterally Skin: no rashes Lymph: no new lymphadenopathy Neuro: nonfocal  Lab Results:  Basename 04/06/12 0655 04/05/12 0556  WBC 7.3 9.7  HGB 8.2* 8.3*  HCT 25.2* 25.5*  PLT 350 339    BMET  Basename 04/06/12 0655 04/05/12 0556  NA 141 137  K 4.6 4.2  CL 105 103  CO2 29 26  GLUCOSE 71 104*  BUN 22 23  CREATININE 1.66* 1.69*  CALCIUM 8.9 8.6    Micro Results: Recent Results (from the past 240 hour(s))  CULTURE, BLOOD (ROUTINE X 2)     Status: Normal   Collection Time   03/28/12 12:15 AM      Component Value Range Status Comment   Specimen Description BLOOD LEFT ARM   Final    Special  Requests BOTTLES DRAWN AEROBIC AND ANAEROBIC 10CC   Final    Culture  Setup Time [id number]   Final    Culture NO GROWTH 5 DAYS   Final    Report Status 04/03/2012 FINAL   Final   CULTURE, BLOOD (ROUTINE X 2)     Status: Normal   Collection Time   03/28/12 12:25 AM      Component Value Range Status Comment   Specimen Description BLOOD RIGHT ARM   Final    Special Requests BOTTLES DRAWN AEROBIC ONLY 10CC   Final    Culture  Setup Time [id number]   Final    Culture NO GROWTH 5 DAYS   Final    Report Status 04/03/2012 FINAL   Final   WOUND CULTURE     Status: Normal   Collection Time   04/02/12 12:30 PM      Component Value Range Status Comment   Specimen Description WOUND MOUTH RIGHT   Final    Special Requests ORAL MUCOSA   Final    Gram Stain     Final    Value: FEW WBC PRESENT, PREDOMINANTLY PMN     NO SQUAMOUS EPITHELIAL CELLS SEEN     NO ORGANISMS SEEN   Culture NO GROWTH 2 DAYS   Final    Report Status 04/04/2012 FINAL   Final     Studies/Results: Ct Abdomen Pelvis Wo Contrast  04/05/2012  *RADIOLOGY REPORT*  Clinical Data: Abdominal pain, abnormal LFTs  CT ABDOMEN AND PELVIS WITHOUT CONTRAST  Technique:  Multidetector CT imaging of the abdomen and pelvis was performed following the standard protocol without intravenous contrast.  Comparison: Ultrasound 04/03/2012  Findings: There is  bilateral moderate pleural effusions which layer dependently.  There is dense right basilar atelectasis versus pneumonia with air bronchograms.  There is passive atelectasis at the left lung base additionally.  Small pericardial effusion is noted.  Non-IV contrast of the images of the liver are unremarkable.  The gallbladder wall is mildly thickened with mild pericholecystic fluid.  This is similar to comparison ultrasound.  Pancreas, spleen, adrenal glands, and kidneys appear normal.  The stomach, small bowel, appendix, and cecum are normal.  There is moderate volume stool throughout the colon.  The  rectosigmoid colon is normal.  Abdominal aorta normal caliber.  No retroperitoneal periportal lymphadenopathy.  No free fluid the pelvis.  The bladder is distended.  No pelvic lymphadenopathy.  Prostate gland is small.  There is small amount  of free fluid within the pelvis which has simple fluid attenuation. Review of  bone windows demonstrates no aggressive osseous lesions.  IMPRESSION:  1.  Large bilateral pleural effusions with associated dense basilar passive atelectasis.  Cannot exclude a component of pneumonia at the lung bases.  Extensive air bronchograms. 2.  Small pericardial effusion. 3.  Mild gallbladder wall thickening and small amount pericholecystic fluid. 4.  Small amount of free fluid in the pelvis may relate to liver dysfunction. 5.  Distended bladder.  Original Report Authenticated By: Suzy Bouchard, M.D.   Dg Chest 2 View  04/05/2012  *RADIOLOGY REPORT*  Clinical Data: Shortness of breath and chest pain.  CHEST - 2 VIEW  Comparison: 03/31/2012 chest radiograph and 03/28/2012 CT  Findings: Moderate to large bilateral pleural effusions with lower lung atelectasis again noted. There is no evidence of pneumothorax. Pulmonary vascular congestion has decreased. No other changes are identified.  IMPRESSION: Unchanged moderate to large bilateral pleural effusions and lower lung atelectasis.  Decreased pulmonary vascular congestion.  Original Report Authenticated By: Lura Em, M.D.      Assessment/Plan: [name] [name] is a 46 y.o. male with recent pneumococcal bacteremia and pneumonia readmitted with apparent healthcare associated pneumonia and a buccal abscess.. He had elevationg in liver fxn tests in particular alk phosph ? Cause. Still with large pleural effusion  1) HCAP:  --sp more than 7 days of zyvox and levaquin, will dc levaquin   2) Pleural effusion: agree with removing fluid and seeing if this is exudative. If it is an empyema he will need protracted antibiotics     2) Buccal abscess: Dr. Hoyt Koch thinks this is due to parotid disease which would make MSSA MRSA more likely culprits. Still need to keep in mid odontogenic flora as well. He is well covered with current regimen.  --changed to zyvox and could continue this to cover for MRSA, MSSA, most oral flora, another cheaper alternative would be avelox and doxycyline      LOS: 10 days   Alcide Evener 04/06/2012, 11:16 AM

## 2012-04-06 NOTE — Procedures (Signed)
Thoracentesis with U/S guidance  Thoracentesis for pleural fluid analysis.  Consent from patient.  Pleural space evaluated via U/S and pocket found.  Area marked, cleaned and lidocaine injected.  Needle placed through chest wall to the pleural space and 3 cc of yellow-brown fluid removed.  We were unable to remove additional fluid.  CXR order in place.  Fluid was taken to the lab, we will only be able to get cultures and cell count with diff.  Will F/U on analysis and inform patient when results are available.  U/S guidance utilized.  I was present throughout the entire procedure.  Jennet Maduro, M.D. [phone]

## 2012-04-06 NOTE — Progress Notes (Signed)
Gastroenterology Progress Note  Subjective: Feels better. No GI complaints.  Objective:  Vital signs in last 24 hours: Temp:  [98.1 F (36.7 C)-98.7 F (37.1 C)] 98.1 F (36.7 C) (04/15 0600) Pulse Rate:  [81-97] 81  (04/15 0600) Resp:  [20] 20  (04/15 0600) BP: (155-165)/(91-95) 155/91 mmHg (04/15 0600) SpO2:  [88 %-89 %] 88 % (04/15 0850) Last BM Date: 04/01/12 (Note left for MD)  General:   Alert,  In pain and distress from recent Thoracentesis. Heart:  Regular rate and rhythm; no murmurs Abdomen:  Soft, nontender and nondistended. Normal bowel sounds, without guarding, and without rebound.   Extremities:  Without edema. Neurologic:  Alert and  oriented x4;  grossly normal neurologically. Psych:  Alert and cooperative.  Lab Results:  Basename 04/06/12 0655 04/05/12 0556 04/04/12 0720  WBC 7.3 9.7 7.3  HGB 8.2* 8.3* 7.6*  HCT 25.2* 25.5* 23.2*  PLT 350 339 261   BMET  Basename 04/06/12 0655 04/05/12 0556 04/04/12 0720  NA 141 137 139  K 4.6 4.2 3.9  CL 105 103 102  CO2 29 26 29   GLUCOSE 71 104* 116*  BUN 22 23 25*  CREATININE 1.66* 1.69* 1.60*  CALCIUM 8.9 8.6 8.4   LFT  Basename 04/06/12 0655  PROT 6.7  ALBUMIN 2.0*  AST 29  ALT 61*  ALKPHOS 1010*  BILITOT 0.5  BILIDIR --  IBILI --   Studies/Results: Ct Abdomen Pelvis Wo Contrast  04/05/2012  *RADIOLOGY REPORT*  Clinical Data: Abdominal pain, abnormal LFTs  CT ABDOMEN AND PELVIS WITHOUT CONTRAST  Technique:  Multidetector CT imaging of the abdomen and pelvis was performed following the standard protocol without intravenous contrast.  Comparison: Ultrasound 04/03/2012  Findings: There is  bilateral moderate pleural effusions which layer dependently.  There is dense right basilar atelectasis versus pneumonia with air bronchograms.  There is passive atelectasis at the left lung base additionally.  Small pericardial effusion is noted.  Non-IV contrast of the images of the liver are unremarkable.  The  gallbladder wall is mildly thickened with mild pericholecystic fluid.  This is similar to comparison ultrasound.  Pancreas, spleen, adrenal glands, and kidneys appear normal.  The stomach, small bowel, appendix, and cecum are normal.  There is moderate volume stool throughout the colon.  The rectosigmoid colon is normal.  Abdominal aorta normal caliber.  No retroperitoneal periportal lymphadenopathy.  No free fluid the pelvis.  The bladder is distended.  No pelvic lymphadenopathy.  Prostate gland is small.  There is small amount of free fluid within the pelvis which has simple fluid attenuation. Review of  bone windows demonstrates no aggressive osseous lesions.  IMPRESSION:  1.  Large bilateral pleural effusions with associated dense basilar passive atelectasis.  Cannot exclude a component of pneumonia at the lung bases.  Extensive air bronchograms. 2.  Small pericardial effusion. 3.  Mild gallbladder wall thickening and small amount pericholecystic fluid. 4.  Small amount of free fluid in the pelvis may relate to liver dysfunction. 5.  Distended bladder.  Original Report Authenticated By: Suzy Bouchard, M.D.   Dg Chest 2 View  04/05/2012  *RADIOLOGY REPORT*  Clinical Data: Shortness of breath and chest pain.  CHEST - 2 VIEW  Comparison: 03/31/2012 chest radiograph and 03/28/2012 CT  Findings: Moderate to large bilateral pleural effusions with lower lung atelectasis again noted. There is no evidence of pneumothorax. Pulmonary vascular congestion has decreased. No other changes are identified.  IMPRESSION: Unchanged moderate to large bilateral pleural effusions and  lower lung atelectasis.  Decreased pulmonary vascular congestion.  Original Report Authenticated By: Lura Em, M.D.   Assessment / Plan: 1) Elevated AP, intrahepatic cholestasis. US shows GB sludge, no biliary dilation. Serologies so far are negative, several are pending.  2) LA Grade C esophagitis. Continue PPI. 3) Pneumonia.  4)  Increased creatinine 5) Anemia.  Alk Phos is trending down slowly- 1010<1151<1491<1356<714. Still unclear etiology. Alpha 1 Antitrypsin- elevated (not low). ANA is positive.  ACE levels normal( low likelyhood of Sarcoid). AMA and ASMA pending. Wait for thoracentesis results- done today.   Principal Problem:  *Community acquired pneumonia Active Problems:  Diabetes mellitus  Acute kidney failure  Elevated LFTs  Bacteremia due to Streptococcus pneumoniae  Hyponatremia  Bilateral leg edema  Cyst of skin  Parapneumonic effusion  Anemia  Hypoalbuminemia  Total bilirubin, elevated Patient seen and discussed with Dr. Criss Alvine. Follow serial LFT's for now.   LOS: 10 days   PATEL,RAVI MD  04/06/2012, 2:56 PM

## 2012-04-07 ENCOUNTER — Inpatient Hospital Stay (HOSPITAL_COMMUNITY): Payer: Managed Care, Other (non HMO)

## 2012-04-07 ENCOUNTER — Encounter (HOSPITAL_COMMUNITY): Payer: Self-pay | Admitting: Radiology

## 2012-04-07 DIAGNOSIS — J90 Pleural effusion, not elsewhere classified: Secondary | ICD-10-CM

## 2012-04-07 DIAGNOSIS — J189 Pneumonia, unspecified organism: Secondary | ICD-10-CM

## 2012-04-07 LAB — GLUCOSE, CAPILLARY
Glucose-Capillary: 172 mg/dL — ABNORMAL HIGH (ref 70–99)
Glucose-Capillary: 117 mg/dL — ABNORMAL HIGH (ref 70–99)
Glucose-Capillary: 224 mg/dL — ABNORMAL HIGH (ref 70–99)

## 2012-04-07 LAB — PROTEIN ELECTROPHORESIS, SERUM
Albumin ELP: 31.7 % — ABNORMAL LOW (ref 55.8–66.1)
Alpha-1-Globulin: 9.6 % — ABNORMAL HIGH (ref 2.9–4.9)
Alpha-2-Globulin: 17.1 % — ABNORMAL HIGH (ref 7.1–11.8)
Beta 2: 7.7 % — ABNORMAL HIGH (ref 3.2–6.5)
Beta Globulin: 5.2 % (ref 4.7–7.2)
Gamma Globulin: 28.7 % — ABNORMAL HIGH (ref 11.1–18.8)
M-Spike, %: NOT DETECTED g/dL
Total Protein ELP: 6.5 g/dL (ref 6.0–8.3)

## 2012-04-07 LAB — COMPREHENSIVE METABOLIC PANEL
ALT: 48 U/L (ref 0–53)
AST: 26 U/L (ref 0–37)
Albumin: 2 g/dL — ABNORMAL LOW (ref 3.5–5.2)
Alkaline Phosphatase: 909 U/L — ABNORMAL HIGH (ref 39–117)
BUN: 21 mg/dL (ref 6–23)
CO2: 26 mEq/L (ref 19–32)
Calcium: 8.7 mg/dL (ref 8.4–10.5)
Chloride: 101 mEq/L (ref 96–112)
Creatinine, Ser: 1.57 mg/dL — ABNORMAL HIGH (ref 0.50–1.35)
GFR calc Af Amer: 60 mL/min — ABNORMAL LOW (ref >90)
GFR calc non Af Amer: 52 mL/min — ABNORMAL LOW (ref >90)
Glucose, Bld: 171 mg/dL — ABNORMAL HIGH (ref 70–99)
Potassium: 4.2 mEq/L (ref 3.5–5.1)
Sodium: 136 mEq/L (ref 135–145)
Total Bilirubin: 0.4 mg/dL (ref 0.3–1.2)
Total Protein: 7 g/dL (ref 6.0–8.3)

## 2012-04-07 LAB — CBC
HCT: 26.5 % — ABNORMAL LOW (ref 39.0–52.0)
Hemoglobin: 8.6 g/dL — ABNORMAL LOW (ref 13.0–17.0)
MCH: 28.6 pg (ref 26.0–34.0)
MCHC: 32.5 g/dL (ref 30.0–36.0)
MCV: 88 fL (ref 78.0–100.0)
Platelets: 445 10*3/uL — ABNORMAL HIGH (ref 150–400)
RBC: 3.01 MIL/uL — ABNORMAL LOW (ref 4.22–5.81)
RDW: 14.8 % (ref 11.5–15.5)
WBC: 10.7 10*3/uL — ABNORMAL HIGH (ref 4.0–10.5)

## 2012-04-07 LAB — IMMUNOFIXATION ELECTROPHORESIS
IgA: 352 mg/dL (ref 68–379)
IgG (Immunoglobin G), Serum: 1910 mg/dL — ABNORMAL HIGH (ref 650–1600)
IgM, Serum: 113 mg/dL (ref 41–251)
Total Protein ELP: 6.5 g/dL (ref 6.0–8.3)

## 2012-04-07 LAB — ANTI-SMOOTH MUSCLE ANTIBODY, IGG: F-Actin IgG: 10 U (ref <20)

## 2012-04-07 LAB — PATHOLOGIST SMEAR REVIEW

## 2012-04-07 MED ORDER — METRONIDAZOLE 500 MG PO TABS
500.0000 mg | ORAL_TABLET | Freq: Three times a day (TID) | ORAL | Status: DC
  Administered 2012-04-07 – 2012-04-14 (×17): 500 mg via ORAL
  Filled 2012-04-07 (×26): qty 1

## 2012-04-07 NOTE — Progress Notes (Signed)
Agree with PTA treatment note and discharging patient.  Thanks!! [place], [place] DPT [phone]

## 2012-04-07 NOTE — Progress Notes (Signed)
   CARE MANAGEMENT NOTE 04/07/2012  Patient:  [name] [name], [name] [name]   Account Number:  [id number]  Date Initiated:  03/31/2012  Documentation initiated by:  Lizabeth Leyden  Subjective/Objective Assessment:   Patient admitted with cough and chest pain.     Action/Plan:   Progression of care and discharge planning   Anticipated DC Date:  04/02/2012   Anticipated DC Plan:  [place]  CM consult      PAC Choice  [place]   Choice offered to / List presented to:  C-1 Patient   DME arranged  [place]      DME agency  [place] arranged  [place] OT      [place].   Status of service:  In process, will continue to follow Medicare Important Message given?   (If response is "NO", the following Medicare IM given date fields will be blank) Date Medicare IM given:   Date Additional Medicare IM given:    Discharge Disposition:    Per UR Regulation:    If discussed at Long Length of Stay Meetings, dates discussed:    Comments:  PCP: Dr Nolon Rod  04/07/12 Lars Pinks, RN, BSN [place]  04/06/12 Lars Pinks, RN, BSN 1512 PT WAS FOUND TO HAVE BILATERAL PLEURAL EFFUSIONS AND WILL NEED TAPPING, IF PT IS ABLE TO TOLERATE AS HE HAS HAD A FAILED ATTEMPT.  WILL F/U.  04/03/12 Lars Pinks, RN, BSN 1434 UR COMPLETED  04/03/2012 [place], [place] APRIA: will deliver the DME to hospital today.  04/03/2012  [place], [place] Met with patient to discuss discharge plans for home health services.  Reviewed home health agencies, he selected [facility] for Pt and OT. DME: RW and Tub bench [facility]. [facility]/ [facility] referral for PT and OT APRIA/ referral for rollining walker and tub bench.  04/01/12 Lars Pinks, RN ,BSN 1636 PT HAS A DENTAL ABCESS AND WILL NEED TEETH EXTRACTED.  PT STATED THAT HE LIVES ON  THE 3RD FLOOR OF AN APARTMENT BUILDING AND FEELS THAT HE WOULD BENEFIT FROM HH PT AND A TUB BENCH.  PT/OT TO EVAL PT.  WILL F/U ON DC NEEDS.  03/30/2012 [place], [place] Met with patient to discuss discharge planning. CM to continue to follow for discharge needs.

## 2012-04-07 NOTE — Progress Notes (Deleted)
Physical Therapy Treatment Patient Details Name: [name] [name] MRN: [id number] DOB: 1966-04-24 Today's Date: 04/07/2012  PT Assessment/Plan  PT - Assessment/Plan Comments on Treatment Session: Pt's was able to amb with improve balance. Pt had no LOB during amb. Pt did not have to take breaks during amb. Pt was able to amb 2 flights of stairs without the use of O2. Pt's sat stayed above 85 after stairs and improved to 89 with rest/ deep breathing. Pt was encouarged to amb with staff without O2 to improve lung tolerance. PT Plan: Discharge plan remains appropriate;Frequency remains appropriate PT Frequency: Min 3X/week Follow Up Recommendations: Home health PT PT Goals  Acute Rehab PT Goals PT Goal: Sit to Stand - Progress: Progressing toward goal PT Goal: Stand to Sit - Progress: Progressing toward goal PT Goal: Stand - Progress: Met PT Goal: Ambulate - Progress: Met PT Goal: Up/Down Stairs - Progress: Progressing toward goal  PT Treatment Precautions/Restrictions  Precautions Precautions: Fall Required Braces or Orthoses DO NOT USE: No Restrictions Weight Bearing Restrictions: No Mobility (including Balance) Bed Mobility Bed Mobility: No Transfers Transfers: Yes Sit to Stand: 6: Modified independent (Device/Increase time) Stand to Sit: 6: Modified independent (Device/Increase time) Ambulation/Gait Ambulation/Gait:  (O2 tank as needed ) Ambulation/Gait Assistance Details (indicate cue type and reason): Pt had no LOB. Pt amb with "waddle" gait. Ambulation Distance (Feet): 400 Feet Assistive device: None Gait Pattern: Within Functional Limits Stairs: Yes Stairs Assistance:  (Minguard A for saftey ) Number of Stairs: 24  (2 flights in stairwell 3rd floor) Wheelchair Mobility Wheelchair Mobility: No  End of Session PT - End of Session Activity Tolerance: Patient tolerated treatment well Patient left: in chair;with call bell in reach Nurse Communication: Mobility  status for transfers;Mobility status for ambulation General Behavior During Session: [facility] for tasks performed Cognition: [facility] for tasks performed  Cathlean Cower 04/07/2012, 11:49 AM

## 2012-04-07 NOTE — Progress Notes (Addendum)
[name] [name] CSN:[id number],MRN:[id number] is a 46 y.o. male,  Outpatient Primary MD for the patient is [name], MD, MD  Chief Complaint  Patient presents with  . Emesis        Subjective:    [name] [name] today has, No headache, No chest pain, No abdominal pain - No Nausea, No new weakness tingling or numbness, No Cough, he continues to have some right-sided pressure and exertional SOB . On 04/05/2012 He is complaining of left eye purple colored squeakily worm on his left eye vision field (10" O clock position) does not report of any vision loss or weakness or headache.   Objective:   Filed Vitals:   04/06/12 0850 04/06/12 1400 04/06/12 2113 04/07/12 0519  BP:  144/86 145/84 172/99  Pulse:  81 88 82  Temp:  98.5 F (36.9 C) 98.5 F (36.9 C) 98.8 F (37.1 C)  TempSrc:  Oral Oral Oral  Resp:  19 18 20   Height:      Weight:      SpO2: 88% 94% 95% 95%    Wt Readings from Last 3 Encounters:  04/04/12 91.3 kg (201 lb 4.5 oz)  04/04/12 91.3 kg (201 lb 4.5 oz)  04/04/12 91.3 kg (201 lb 4.5 oz)     Intake/Output Summary (Last 24 hours) at 04/07/12 1109 Last data filed at 04/07/12 0845  Gross per 24 hour  Intake    240 ml  Output      0 ml  Net    240 ml    Exam Awake Alert, Oriented *3, No new F.N deficits, Normal affect [place].AT,PERRAL. Right-sided facial swelling. Supple Neck,No JVD, No cervical lymphadenopathy appriciated.  Symmetrical Chest wall movement, Good air movement bilaterally, diminished breath sounds bilaterally with some bibasilar Rales RRR,No Gallops,Rubs or new Murmurs, No Parasternal Heave +ve B.Sounds, Abd Soft, Non tender, No organomegaly appriciated, No rebound -guarding or rigidity. No Cyanosis, Clubbing , 1+edema, No new Rash or bruise      Data Review   CBC  Lab 04/07/12 0620 04/06/12 0655 04/05/12 0556 04/04/12 0720 04/03/12 0953 04/02/12 0605  WBC 10.7* 7.3 9.7 7.3 -- 11.0*  HGB 8.6* 8.2* 8.3* 7.6* 8.5* --  HCT 26.5*  25.2* 25.5* 23.2* 26.4* --  PLT 445* 350 339 261 -- 271  MCV 88.0 88.4 88.5 87.2 -- 87.9  MCH 28.6 28.8 28.8 28.6 -- 28.8  MCHC 32.5 32.5 32.5 32.8 -- 32.8  RDW 14.8 14.6 14.5 14.6 -- 15.1  LYMPHSABS -- -- -- -- -- --  MONOABS -- -- -- -- -- --  EOSABS -- -- -- -- -- --  BASOSABS -- -- -- -- -- --  BANDABS -- -- -- -- -- --    Chemistries   Lab 04/07/12 0620 04/06/12 0655 04/05/12 0556 04/04/12 0720 04/03/12 1120 04/03/12 0630  NA 136 141 137 139 137 --  K 4.2 4.6 4.2 3.9 4.1 --  CL 101 105 103 102 97 --  CO2 26 29 26 29 29  --  GLUCOSE 171* 71 104* 116* 88 --  BUN 21 22 23  25* 27* --  CREATININE 1.57* 1.66* 1.69* 1.60* 1.54* --  CALCIUM 8.7 8.9 8.6 8.4 8.7 --  MG -- -- -- -- -- --  AST 26 29 -- 69* 132* 161*  ALT 48 61* -- 101* 141* 128*  ALKPHOS 909* 1010* -- 1151* 1491* 1356*  BILITOT 0.4 0.5 -- 0.4 0.6 0.6   ------------------------------------------------------------------------------------------------------------------ estimated creatinine clearance is 69.1 ml/min (by C-G formula based on  Cr of 1.57). ------------------------------------------------------------------------------------------------------------------ No results found for this basename: HGBA1C:2 in the last 72 hours ------------------------------------------------------------------------------------------------------------------ No results found for this basename: CHOL:2,HDL:2,LDLCALC:2,TRIG:2,CHOLHDL:2,LDLDIRECT:2 in the last 72 hours ------------------------------------------------------------------------------------------------------------------ No results found for this basename: TSH,T4TOTAL,FREET3,T3FREE,THYROIDAB in the last 72 hours ------------------------------------------------------------------------------------------------------------------  [facility] 04/04/12 1210  VITAMINB12 679  FOLATE 13.9  FERRITIN 1235*  TIBC 195*  IRON 19*  RETICCTPCT 1.7    Coagulation profile No results found for  this basename: INR:5,PROTIME:5 in the last 168 hours  No results found for this basename: DDIMER:2 in the last 72 hours  Cardiac Enzymes No results found for this basename: CK:3,CKMB:3,TROPONINI:3,MYOGLOBIN:3 in the last 168 hours ------------------------------------------------------------------------------------------------------------------ No components found with this basename: POCBNP:3  Micro Results Recent Results (from the past 240 hour(s))  WOUND CULTURE     Status: Normal   Collection Time   04/02/12 12:30 PM      Component Value Range Status Comment   Specimen Description WOUND MOUTH RIGHT   Final    Special Requests ORAL MUCOSA   Final    Gram Stain     Final    Value: FEW WBC PRESENT, PREDOMINANTLY PMN     NO SQUAMOUS EPITHELIAL CELLS SEEN     NO ORGANISMS SEEN   Culture NO GROWTH 2 DAYS   Final    Report Status 04/04/2012 FINAL   Final   BODY FLUID CULTURE     Status: Normal (Preliminary result)   Collection Time   04/06/12  2:40 PM      Component Value Range Status Comment   Specimen Description FLUID PLEURAL   Final    Special Requests 1 TUBE @ 2.2CC   Final    Gram Stain     Final    Value: MODERATE WBC PRESENT, PREDOMINANTLY PMN     NO ORGANISMS SEEN   Culture PENDING   Incomplete    Report Status PENDING   Incomplete     Radiology Reports Dg Chest 2 View  03/28/2012  *RADIOLOGY REPORT*  Clinical Data: Cough, right rib pain, emesis, recent pneumonia  CHEST - 2 VIEW  Comparison: 03/16/2012  Findings: Patchy right lower lobe opacity, suspicious for pneumonia, with associated small-to-moderate right pleural effusion.  Left basilar opacity, atelectasis versus pneumonia, with suspected small left pleural effusion.  No pneumothorax.  The heart is normal in size.  Interval removal of right subclavian PICC.  Visualized osseous structures are within normal limits.  IMPRESSION: Patchy right lower lobe opacity, suspicious for pneumonia, with associated small-to-moderate  right pleural effusion.  Left basilar opacity, atelectasis versus pneumonia, with suspected small left pleural effusion.  Original Report Authenticated By: Julian Hy, M.D.   Dg Chest 2 View  03/16/2012  *RADIOLOGY REPORT*  Clinical Data: History of pneumonia.  CHEST - 2 VIEW  Comparison: 03/11/2012.  Findings: Tip of right PICC terminates in right atrial region.  No pneumothorax is seen.  There is some atelectasis and infiltrate in the medial posterior aspect of the left lung.  There are atelectasis and infiltrative densities seen within the right perihilar region and within the right midlung and right lower lung areas with consolidation and atelectasis.  There is improvement in the amount of infiltrate compared to previous study.   On the lateral image there is continued abnormal opacity inferiorly and posteriorly.  Indistinctness of costophrenic angles may reflect an element of pleural effusion.  IMPRESSION: Right PICC in place.  Some atelectasis and infiltrative density in the medial posterior left lung inferiorly. here are atelectasis and infiltrative  densities seen within the right perihilar region and within the right midlung and right lower lung areas with consolidation and atelectasis.  There is improvement in the amount of infiltrative compared to previous study but clearing has not occurred. Question small amounts of pleural effusion.  Original Report Authenticated By: Delane Ginger, M.D.   Dg Chest 2 View  03/11/2012  *RADIOLOGY REPORT*  Clinical Data: Shortness of breath with right lower chest pain and fever.  CHEST - 2 VIEW  Comparison: None.  Findings: Trachea is midline.  Heart size normal.  There is dense airspace consolidation in the right lower lobe.  Question mild left lower lobe air space disease.  No left pleural fluid.  IMPRESSION:  1.  Dense airspace consolidation in the right lower lobe is most consistent with pneumonia.  Follow-up to clearing is recommended, to exclude a centrally  obstructing mass. 2.  Question mild left lower lobe air space disease.  Original Report Authenticated By: Luretha Rued, M.D.   Ct Chest Wo Contrast  03/28/2012  *RADIOLOGY REPORT*  Clinical Data: Cough, right-sided chest pain  CT CHEST WITHOUT CONTRAST  Technique:  Multidetector CT imaging of the chest was performed following the standard protocol without IV contrast.  Comparison: Chest radiographs dated 03/28/2012.  CT chest dated 03/11/2012.  Findings: Patchy posterior right upper lobe opacity and near complete right lower lobe consolidation, suspicious for pneumonia. Associated moderate right pleural effusion.  Patchy left lower lobe opacity, atelectasis versus pneumonia, with associated small left pleural effusion.  No pneumothorax.  Heart is normal in size.  Small pericardial effusion.  Visualized upper abdomen is unremarkable.  Mild degenerative changes of the visualized thoracolumbar spine.  IMPRESSION: Right upper/lower lobe pneumonia, with associated moderate right pleural effusion.  Patchy left lower lobe opacity, atelectasis versus pneumonia, with associated small left pleural effusion.  Original Report Authenticated By: Julian Hy, M.D.   Ct Angio Chest W/cm &/or Wo Cm  03/11/2012  *RADIOLOGY REPORT*  Clinical Data: Fever.  Cough.  Weakness.  Short of breath.  CT ANGIOGRAPHY CHEST  Technique:  Multidetector CT imaging of the chest using the standard protocol during bolus administration of intravenous contrast. Multiplanar reconstructed images including MIPs were obtained and reviewed to evaluate the vascular anatomy.  Contrast: 4mL OMNIPAQUE IOHEXOL 350 MG/ML IV SOLN  Comparison: Chest radiography same day  Findings: Pulmonary arterial opacification is excellent.  There are no pulmonary emboli.  There is consolidative pneumonia throughout the right lower lobe with minor involvement of the right upper lobe.  There is consolidative pneumonia less extensively in the posterior aspect of the  left lower lobe.  There is a small amount of pleural fluid on the right.  There is a tiny amount of pericardial fluid.  There are small mediastinal lymph nodes, likely reactive.  No upper abdominal pathology is seen.  IMPRESSION: No pulmonary emboli.  Consolidative pneumonia throughout the right lower lobe.  Partial involvement of the left lower lobe by consolidative  pneumonia. Minimal involvement of the right upper lobe.  Original Report Authenticated By: Jules Schick, M.D.   Nm Hepatobiliary  03/14/2012  *RADIOLOGY REPORT*  Clinical Data: Abdominal pain, evaluate for cholecystitis  NUCLEAR MEDICINE HEPATOHBILIARY INCLUDE GB  Radiopharmaceutical:  5.5 mCi technetium 26m Choletec  Comparison: Ultrasound dated 03/12/2012.  Findings: Normal hepatic uptake.  Small bowel is visible within 20 minutes.  Gallbladder is visible within 30 minutes, confirming cystic duct patency.  IMPRESSION: Normal hepatobiliary nuclear medicine scan.  Original Report Authenticated By: Julian Hy,  M.D.   [place] Chest  03/28/2012  *RADIOLOGY REPORT*  Clinical Data: 46 year old male with pneumonia and pleural effusions on chest CT.  Assess for thoracentesis.  CHEST ULTRASOUND  Comparison: Chest CT without contrast 03/28/2012 and earlier.  Findings: Gray-scale imaging of the right hemithorax demonstrates a small component of pleural fluid, with a larger adjacent component of consolidated lung.  No free fluid seen subjacent to the right hemidiaphragm.  The quantity of pleural fluid identified is insufficient for thoracentesis.  IMPRESSION: Right pleural effusion and larger component of right lung consolidation on ultrasound.  Pleural fluid component quantity insufficient for thoracentesis at this time.  Original Report Authenticated By: Randall An, M.D.   US Abdomen Complete  03/28/2012  *RADIOLOGY REPORT*  Clinical Data:  Elevated LFTs.  Right upper quadrant pain and nausea.  ABDOMINAL ULTRASOUND COMPLETE  Comparison:   03/12/2012  Findings:  Gallbladder:  No gallstones, gallbladder wall thickening, or pericholecystic fluid. Negative sonographic Murphy's sign reported by the sonographer.  Common Bile Duct:  Within normal limits in caliber.  Liver: No focal mass lesion identified.  Within normal limits in parenchymal echogenicity.  IVC:  Appears normal.  Pancreas:  No abnormality identified.  Spleen:  Within normal limits in size and echotexture.  Right kidney:  Normal in size and parenchymal echogenicity.  No evidence of mass or hydronephrosis.  Left kidney:  Normal in size and parenchymal echogenicity.  No evidence of mass or hydronephrosis.  Abdominal Aorta:  No aneurysm identified.  Other findings:  Small bilateral pleural effusions.  Small amount of upper abdominal ascites posterior to the liver and spleen.  IMPRESSION:  1.  Small amount of abdominal ascites. 2.  Small pleural effusions.  Original Report Authenticated By: Angelita Ingles, M.D.   US Abdomen Complete  03/12/2012  *RADIOLOGY REPORT*  Clinical Data:  Right upper quadrant abdominal pain, elevated liver function tests  ABDOMINAL ULTRASOUND COMPLETE  Comparison:  Chest CT 03/11/2012 with incomplete visualization of the liver  Findings:  Gallbladder:  Minimal dependent sludge is noted within the gallbladder.  The gallbladder is not distended, with gallbladder wall thickness measuring borderline thickened at 4 mm.  No shadowing calculus or sonographic Murphy's sign noted.  Common Bile Duct:  Within normal limits in caliber.  Liver: No focal mass lesion identified.  Within normal limits in parenchymal echogenicity.  IVC:  Appears normal.  Pancreas:  No abnormality identified.  Spleen:  Within normal limits in size and echotexture.  Right kidney:  Normal in size and parenchymal echogenicity.  No evidence of mass or hydronephrosis.  Left kidney:  Normal in size and parenchymal echogenicity.  No evidence of mass or hydronephrosis.  Abdominal Aorta:  Small amount of  ascites is noted.  Trace left pleural effusion.  IMPRESSION: Minimal sludge within the gallbladder with borderline wall thickening.  This could indicate cholecystitis in the appropriate clinical context.  Trace ascites.  Original Report Authenticated By: Arline Asp, M.D.   Ct Maxillofacial W/cm  04/01/2012  *RADIOLOGY REPORT*  Clinical Data: Swelling on the right for several weeks.  Jaw pain.  CT MAXILLOFACIAL WITH CONTRAST  Technique:  Multidetector CT imaging of the maxillofacial structures was performed with intravenous contrast. Multiplanar CT image reconstructions were also generated.  Contrast: 55mL OMNIPAQUE IOHEXOL 300 MG/ML  SOLN  Comparison: None.  Findings: In the right buccal region, there is a complex fluid collection spanning over 5 x 2.1 x 1.1 cm.  This is immediately adjacent to the significant dental disease.  Mild cervical spondylotic changes.  Visualized mastoid air cells, middle ear cavities and paranasal sinuses are clear.  IMPRESSION: Probable abscess right buccal region most likely dental in origin given the degree of significant caries and periapical lucency of the molars.  Results called to Bari Mantis patient's nurse 04/01/2012 3:20 a.m. with request to call report first in the morning.  Original Report Authenticated By: Doug Sou, M.D.   Dg Chest Port 1 View  03/31/2012  *RADIOLOGY REPORT*  Clinical Data: Assess infiltrate.  PORTABLE CHEST - 1 VIEW  Comparison: 03/28/2012  Findings: Very low lung volumes.  Worsening bibasilar opacities and probable edema.  Bilateral effusions.  Cardiomegaly.  IMPRESSION: Increasing pulmonary edema, bibasilar opacities and effusions. Very low lung volumes.  Original Report Authenticated By: Raelyn Number, M.D.    Echo  - Left ventricle: The cavity size was normal. Wall thickness was increased in a pattern of mild LVH. Systolic function was normal. The estimated ejection fraction was in the range of 55% to 60%. - Atrial septum: No  defect or patent foramen ovale was identified. - Pericardium, extracardiac: Small pericardial effusion with no evidence of tamponade    Scheduled Meds:    . albuterol  2.5 mg Nebulization TID  . amLODipine  5 mg Oral Daily  . atorvastatin  20 mg Oral q1800  . docusate sodium  100 mg Oral BID  . ferrous sulfate  325 mg Oral BID WC  . guaiFENesin  1,200 mg Oral BID  . insulin aspart  0-9 Units Subcutaneous TID WC  . insulin glargine  20 Units Subcutaneous QHS  . lidocaine  2 patch Transdermal Q24H  . linezolid  600 mg Oral Q12H  . metoprolol tartrate  50 mg Oral BID  . senna  1 tablet Oral BID  . sodium chloride  3 mL Intravenous Q12H  . DISCONTD: albuterol  2.5 mg Nebulization Q6H  . DISCONTD: docusate sodium  200 mg Oral Daily  . DISCONTD: levofloxacin (LEVAQUIN) IV  750 mg Intravenous Q24H   Continuous Infusions:   PRN Meds:.sodium chloride, acetaminophen, acetaminophen, albuterol, HYDROcodone-acetaminophen, morphine, ondansetron (ZOFRAN) IV, ondansetron, oxyCODONE, sodium chloride  Assessment & Plan   Brief narrative:  46 year old man recently treated for streptococcus pneumoniae bacteremia and pneumonia , anemia, right-sided facial swelling and elevated liver enzymes along with acute renal failure during his first hospitalization about 2 weeks ago was admitted on 03/12/2012 and discharged on 03/17/2012, patient had received a PICC line during his first admission and was then discharged on IV Rocephin to home as recommended by ID physician Dr. Linus Salmons.  He presented back to the hospital 03/28/2010 with fever and chills, this time he was diagnosed with buccal abscess, along with bilateral pleural effusions with possible right-sided parapneumonic effusion, persistently elevated liver enzymes despite negative hepatitis panel , mild gallbladder sludge on ultrasound but with negative HIDA, he was being seen by GI, pulmonary, ID, renal, hematology.  CT scan of the abdomen and pelvis  which was done on April 14 by hematology short moderate to large sized bilateral pleural effusions, patient did have right-sided pneumonia and right-sided pleuritic chest pain throughout his hospital stay so pulmonary was the cord on the 15th, I do ultrasound-guidance thoracentesis was attempted on the 15th by fibrofatty which he is only 5 cc of fluid brown in color, showing large amount of WBCs and moderate neutrophils, further studies are pending, will wait for pulmonary and ID followup in the future course of action i.e. antibiotics/chest tube depending on  that recommendation. I have also discussed with the patient that it is a [facility] he continues to require IV antibiotics +/- chest tube placement if deemed necessary by pulmonary.  His antibiotics have been switched around by infectious disease physicians, he continues to have moderate to severe anemia probably anemia of chronic disease along with anemia of acute disease in combination with iron deficiency worsened by frequent lab draws, his iron has been replaced intravenously and now is on oral supplements, has been seen by hematology and is being followed up by them, GI has ordered further immunological testing for elevated transaminases, pulmonary has evaluated him several times for his right-sided parapneumonic effusion however effusion size is too small to be tapped who on ultrasound, He has redeveloped mild non-oliguric acute renal insufficiency again with negative ultrasound, this is likely due to vancomycin and anemia, vancomycin has been stopped patient is improving post gentle IV fluids. His edema has improved thereafter Lasix has been stopped also. His creatinine is starting to improve gently.  He has elevated LFTs with positive ANA, right upper quadrant ultrasound x2 along with high-dose scan has been negative no clinical signs of acute cholecystitis, patient is being followed by Dr.Mann and  he will need to follow with GI upon discharge in about 3 weeks for followup on Serological tests ordered by Dr. Collene Mares, and colonoscopy and EGD biopsy results.  He also had left I visual field defect i.e. loose body in his left eye for which he was seen by ophthalmologist Dr.Tanner who diagnosed him with Proliferative diabetic retinopathy with macular edema and vitreous hemorrhage and will need continued outpatient IV followup along with aggressive risk factor modification i.e. Diabetes, hypertension dyslipidemia.    Past medical history:  Diabetes mellitus    Consultations done during hospitalization: And followups needed post discharge  Pulmonology -Dr. Lamonte Sakai Gastroenterology -Dr. Collene Mares ENT (no followup needed) Oral Surgery - Dr. Hoyt Koch Hematology - Dr Jamse Arn Tanner-Opthalmology - will need outpatient Retina specialist to followup CTS called for VATS 04/07/2012 PT    Procedures:  April 9: Upper endoscopy: LA grade C esophagitis. Protonix 40 mg by mouth twice a day- Dr Benson [place] April 10: Colonoscopy - Dr Collene Mares April 11: I&D of Rt Parotid Abscess - Dr Hoyt Koch   Antibiotics:  April 6: Vancomycin  April 6: Zosyn ABX on April 12th switched to Zyvox-Levaquin-Flagyl      #1. 46 y.o. male admitted on 03/27/2012, with recurrent pneumonia along with pleural effusion concerning for empyema, recent admission for Streptococcus pneumoniae bacteremia and was discharged on IV Rocephin, now comes back with fever chills. ID now changed ABX to Zyvox-Lavaquin and Falgyl due to ? Marrow suppression and ARF on 12th, D/W Dr Wendie Agreste, will monitor. Patient continues to have quite a bit of oxygen demand along with exertional shortness of breath, off note CT abdomen done on 04/05/2012 by Haem-Onc shows significantly large amounts of bilateral pleural effusions, I had personally called pulmonary physician Dr. Leonidas Romberg on 04-06-12 to reevaluate the patient from the standpoint of large pleural effusions, a  thoracentesis was attempted on 04/06/2012 which he did 5-10 cc of brown colored fluid, with large amount of WBCs and neutrophils, other studies are pending this could be a parapneumonic vs empyema, for pulmonary input and ID input, question if patient needs chest tube.   Addendum was called by Dr Rogene Houston pt will need VATS for possible empyema, have called.    #2.Mixed pattern elevated LFTs: Etiology not  clear. Abdominal ultrasound unremarkable on the 6th, hepatitis panel negative and recent HIDA scan was negative. ?? Possibly related to effusion. Has bumped up again on 12th, GI following patient, repeat US shows evidence of gallbladder sludge I discussed the case personally with surgeon on call Dr. Sharlett Iles who recommended no surgical intervention at this time as there is no sign of acute cholecystitis on exam or on the ultrasound, with recent negative HIDA scan, echogram shows mild LV hypertrophy but no frank acute heart failure.    #3. Hyponatremia: Stable. Likely related to current pneumonia/pulmonary process. Resolved     #4. Anemia: Likely combination of anemia of chronic disease, iron deficiency, anemia acute disease. No history of bleeding. Fecal occult blood negative. No previous data prior to last hospitalization when he presented with hemoglobin of 10 which quickly decreased, probably from dilution also could be a medication effect. His anemia panel does show severely low iron although ferritin is stable (however it is a acute phase reactant) . Appreciate gastroenterology evaluation, EGD showing grade C. esophagitis placed on PPI, limited colonoscopy also unremarkable, stable post 1 unit on 11th, ? Marrow suppression from Zosyn + AOAD  and iron deficiency, IV iron being given on recommendation of hematology we'll continue to monitor H&H which is improving, appreciate hematology input.    #5. Diabetes mellitus type 2, uncontrolled: Continue sliding scale insulin, Lantus, meal coverage,  due to acute renal insufficiency patient's insulin dose has been reduced on 04/05/2012, few episodes of borderline hypoglycemia were noted.   Lab Results  Component Value Date   HGBA1C 15.7* 03/12/2012     CBG (last 3)   Basename 04/07/12 0713 04/06/12 2118 04/06/12 1654  GLUCAP 172* 138* 118*      #6. Right buccal mucosa mass: This developed during his last hospitalization. Was seen by ENT and oral surgery, underwent I&D of a right parotid abscess by Dr. Hoyt Koch on 04/02/2012, antibiotics as #1 above.  .   #7. Acute renal failure with edema - renal failure worse today along with LFTs, could have been due to combination of vancomycin and anemia, [place] stable, FeNa 1.37, urine does show mild proteinuria and since patient is diabetic we'll try to place him on ACE. but once creatinine is stable will need outpatient renal follow up. Continue to hold lasix, post gentle IVF, vancomycin stopped 03-03-12. Have discussed the case on 04/05/2012 with Dr. Jonnie Finner nephrologist on call who does not recommend any change in  plan of care at this time, most likely this ATN which was improved in due course of time. Patient likely has underlying CKD due to his diabetes which was an extremely poor control as evidenced by his A1c of 15.7. We'll continue to monitor, trend is improving, patient will need to be on ACE inhibitor and will need outpatient nephrology followup after discharge.     #8. Tubular adenoma on colonic polyp biopsy outpatient GI followup.     #9. Left eye 10:00 position sensation of purple colored loose body which per patient started yesterday evening. With no vision loss, no focal neurological deficit, no eye pain or headache. Have discussed it with Dr. Satira Sark patient was seen by him on 04/05/2012 and was diagnosed withProliferative diabetic retinopathy with macular edema and vitreous hemorrhage, patient will need outpatient retina specialist followup along with risk factor modification with  strict control of diabetes mellitus type 2, hypertension, dyslipidemia.     DVT Prophylaxis  SCDs      Thurnell Lose M.D on 04/07/2012 at  11:09 AM  [facility] Office  [phone]

## 2012-04-07 NOTE — Consult Note (Signed)
Reason for Consult:Right parapneumonic effusion Referring Physician: Dr. Jettie Pagan is an 46 y.o. male.  HPI: 46 yo male admitted 4/6 with cc/o pleuritic CP, SOB and nausea  Mr. Delgrosso is a 46 year old nonsmoker who was treated for pneumonia in March of this year. He has streptococcal pneumonia complicated by acute renal failure. He was discharged on 3/25 with home IV antibiotics(ceftriaxone). He felt well until 4/4 when he developed pleuritic right sided CP. Over the next couple of days it worsened, he then began having a lot of nausea. He was readmitted 4/6 with pneumonia and a parapneumonic effusion. He states that the CP was a 10/10 on admission. He was treated with IV antibiotics. A chest CT showed a densely consolidated RLL with a small to moderate effusion. He has improved clinically since admission. Still some pleuritic pain 4/10, but overall feels much better. A followup CXR yesterday showed possible increase in pleural fluid. Attempted thoracentesis yielded only 3 cc of fluid.  Past Medical History  Diagnosis Date  . Diabetes mellitus   . Pneumonia 02/2012    Strep pneumoniae bilateral pneumonia complicated by bacteremia  . Bacteremia     Past Surgical History  Procedure Date  . Esophagogastroduodenoscopy 03/31/2012    Procedure: ESOPHAGOGASTRODUODENOSCOPY (EGD);  Surgeon: Beryle Beams, MD;  Location: [facility] ENDOSCOPY;  Service: Endoscopy;  Laterality: N/A;  . Colonoscopy 04/01/2012    Procedure: COLONOSCOPY;  Surgeon: Juanita Craver, MD;  Location: [facility] ENDOSCOPY;  Service: Endoscopy;  Laterality: N/A;    History reviewed. No pertinent family history.  Social History:  reports that he has never smoked. He has never used smokeless tobacco. He reports that he does not drink alcohol or use illicit drugs.  Allergies:  Allergies  Allergen Reactions  . Penicillins Rash    Tolerating Ceftriaxone (03/12/12)    Medications:  I have reviewed the patient's current  medications. Prior to Admission:  Prescriptions prior to admission  Medication Sig Dispense Refill  . aspirin 81 MG tablet Take 81 mg by mouth every morning.       . insulin aspart (NOVOLOG) 100 UNIT/ML injection Inject 11 Units into the skin 3 (three) times daily before meals. Based on sliding scale. Pt took 16 units today at 1600.      Marland Kitchen insulin glargine (LANTUS) 100 UNIT/ML injection Inject 40 Units into the skin at bedtime.      Marland Kitchen lisinopril (PRINIVIL,ZESTRIL) 5 MG tablet Take 5 mg by mouth every morning.       . Multiple Vitamin (MULITIVITAMIN WITH MINERALS) TABS Take 1 tablet by mouth every morning.      . rosuvastatin (CRESTOR) 10 MG tablet Take 10 mg by mouth every morning.         Results for orders placed during the hospital encounter of 03/27/12 (from the past 48 hour(s))  GLUCOSE, CAPILLARY     Status: Abnormal   Collection Time   04/05/12  8:30 PM      Component Value Range Comment   Glucose-Capillary 100 (*) 70 - 99 (mg/dL)    Comment 1 Notify RN     GLUCOSE, CAPILLARY     Status: Abnormal   Collection Time   04/05/12 10:22 PM      Component Value Range Comment   Glucose-Capillary 146 (*) 70 - 99 (mg/dL)    Comment 1 Notify RN     CBC     Status: Abnormal   Collection Time   04/06/12  6:55 AM  Component Value Range Comment   WBC 7.3  4.0 - 10.5 (K/uL)    RBC 2.85 (*) 4.22 - 5.81 (MIL/uL)    Hemoglobin 8.2 (*) 13.0 - 17.0 (g/dL)    HCT 25.2 (*) 39.0 - 52.0 (%)    MCV 88.4  78.0 - 100.0 (fL)    MCH 28.8  26.0 - 34.0 (pg)    MCHC 32.5  30.0 - 36.0 (g/dL)    RDW 14.6  11.5 - 15.5 (%)    Platelets 350  150 - 400 (K/uL)   COMPREHENSIVE METABOLIC PANEL     Status: Abnormal   Collection Time   04/06/12  6:55 AM      Component Value Range Comment   Sodium 141  135 - 145 (mEq/L)    Potassium 4.6  3.5 - 5.1 (mEq/L)    Chloride 105  96 - 112 (mEq/L)    CO2 29  19 - 32 (mEq/L)    Glucose, Bld 71  70 - 99 (mg/dL)    BUN 22  6 - 23 (mg/dL)    Creatinine, Ser 1.66 (*)  0.50 - 1.35 (mg/dL)    Calcium 8.9  8.4 - 10.5 (mg/dL)    Total Protein 6.7  6.0 - 8.3 (g/dL)    Albumin 2.0 (*) 3.5 - 5.2 (g/dL)    AST 29  0 - 37 (U/L)    ALT 61 (*) 0 - 53 (U/L)    Alkaline Phosphatase 1010 (*) 39 - 117 (U/L)    Total Bilirubin 0.5  0.3 - 1.2 (mg/dL)    GFR calc non Af Amer 48 (*) >90 (mL/min)    GFR calc Af Amer 56 (*) >90 (mL/min)   GLUCOSE, CAPILLARY     Status: Normal   Collection Time   04/06/12  7:01 AM      Component Value Range Comment   Glucose-Capillary 70  70 - 99 (mg/dL)    Comment 1 Notify RN     GLUCOSE, CAPILLARY     Status: Abnormal   Collection Time   04/06/12 12:04 PM      Component Value Range Comment   Glucose-Capillary 133 (*) 70 - 99 (mg/dL)   BODY FLUID CULTURE     Status: Normal (Preliminary result)   Collection Time   04/06/12  2:40 PM      Component Value Range Comment   Specimen Description FLUID PLEURAL      Special Requests 1 TUBE @ 2.2CC      Gram Stain        Value: MODERATE WBC PRESENT, PREDOMINANTLY PMN     NO ORGANISMS SEEN   Culture NO GROWTH      Report Status PENDING     BODY FLUID CELL COUNT WITH DIFFERENTIAL     Status: Abnormal   Collection Time   04/06/12  2:40 PM      Component Value Range Comment   Fluid Type-FCT FLUID      Color, Fluid AMBER (*) YELLOW     Appearance, Fluid CLOUDY (*) CLEAR     WBC, Fluid 950  0 - 1000 (cu mm)    Neutrophil Count, Fluid 65 (*) 0 - 25 (%)    Lymphs, Fluid 20      Monocyte-Macrophage-Serous Fluid 14 (*) 50 - 90 (%)    Eos, Fluid 0      Other Cells, Fluid 1   BASOPHIL  PATHOLOGIST SMEAR REVIEW     Status: Normal   Collection Time  04/06/12  2:40 PM      Component Value Range Comment   Tech Review BENIGN MESOTHELIAL CELLS.     GLUCOSE, CAPILLARY     Status: Abnormal   Collection Time   04/06/12  4:54 PM      Component Value Range Comment   Glucose-Capillary 118 (*) 70 - 99 (mg/dL)   GLUCOSE, CAPILLARY     Status: Abnormal   Collection Time   04/06/12  9:18 PM       Component Value Range Comment   Glucose-Capillary 138 (*) 70 - 99 (mg/dL)    Comment 1 Documented in Chart      Comment 2 Notify RN     CBC     Status: Abnormal   Collection Time   04/07/12  6:20 AM      Component Value Range Comment   WBC 10.7 (*) 4.0 - 10.5 (K/uL)    RBC 3.01 (*) 4.22 - 5.81 (MIL/uL)    Hemoglobin 8.6 (*) 13.0 - 17.0 (g/dL)    HCT 26.5 (*) 39.0 - 52.0 (%)    MCV 88.0  78.0 - 100.0 (fL)    MCH 28.6  26.0 - 34.0 (pg)    MCHC 32.5  30.0 - 36.0 (g/dL)    RDW 14.8  11.5 - 15.5 (%)    Platelets 445 (*) 150 - 400 (K/uL)   COMPREHENSIVE METABOLIC PANEL     Status: Abnormal   Collection Time   04/07/12  6:20 AM      Component Value Range Comment   Sodium 136  135 - 145 (mEq/L)    Potassium 4.2  3.5 - 5.1 (mEq/L)    Chloride 101  96 - 112 (mEq/L)    CO2 26  19 - 32 (mEq/L)    Glucose, Bld 171 (*) 70 - 99 (mg/dL)    BUN 21  6 - 23 (mg/dL)    Creatinine, Ser 1.57 (*) 0.50 - 1.35 (mg/dL)    Calcium 8.7  8.4 - 10.5 (mg/dL)    Total Protein 7.0  6.0 - 8.3 (g/dL)    Albumin 2.0 (*) 3.5 - 5.2 (g/dL)    AST 26  0 - 37 (U/L)    ALT 48  0 - 53 (U/L)    Alkaline Phosphatase 909 (*) 39 - 117 (U/L)    Total Bilirubin 0.4  0.3 - 1.2 (mg/dL)    GFR calc non Af Amer 52 (*) >90 (mL/min)    GFR calc Af Amer 60 (*) >90 (mL/min)   GLUCOSE, CAPILLARY     Status: Abnormal   Collection Time   04/07/12  7:13 AM      Component Value Range Comment   Glucose-Capillary 172 (*) 70 - 99 (mg/dL)    Comment 1 Documented in Chart      Comment 2 Notify RN     GLUCOSE, CAPILLARY     Status: Abnormal   Collection Time   04/07/12 11:29 AM      Component Value Range Comment   Glucose-Capillary 117 (*) 70 - 99 (mg/dL)    Comment 1 Notify RN      Comment 2 Documented in Chart       Dg Chest Port 1 View  04/06/2012  *RADIOLOGY REPORT*  Clinical Data: Status post right thoracentesis.  Question pneumothorax.  PORTABLE CHEST - 1 VIEW  Comparison: Plain film chest 04/05/2012.  Findings: No pneumothorax  is identified after thoracentesis.  Right greater than left pleural effusions persist.  The  patient's right effusion appears somewhat decreased.  Bilateral airspace disease appears unchanged.  Heart size normal.  IMPRESSION:  1.  Negative for pneumothorax after thoracentesis. 2.  Some decrease in right effusion.  Left effusion and bilateral airspace disease persist.  Original Report Authenticated By: Arvid Right. Luther Parody, M.D.    Review of Systems  Constitutional: Positive for fever, chills, weight loss and malaise/fatigue.  Respiratory: Positive for cough, sputum production and shortness of breath. Negative for hemoptysis.        Pleuritic right CP  Cardiovascular: Negative for orthopnea, claudication and leg swelling.  Gastrointestinal: Positive for nausea.  Musculoskeletal: Positive for myalgias.  Neurological: Positive for weakness.  All other systems reviewed and are negative.   Blood pressure 137/86, pulse 67, temperature 98.8 F (37.1 C), temperature source Oral, resp. rate 20, height 6\' 2"  (1.88 m), weight 201 lb 4.5 oz (91.3 kg), SpO2 96.00%. Physical Exam  Constitutional: He is oriented to person, place, and time. He appears well-developed and well-nourished. No distress.  HENT:  Head: Normocephalic and atraumatic.  Eyes: EOM are normal. Pupils are equal, round, and reactive to light.  Neck: Neck supple. No JVD present. No tracheal deviation present.  Cardiovascular: Normal rate, regular rhythm, normal heart sounds and intact distal pulses.   Respiratory: He has no wheezes.       Diminished BS bilaterally  GI: Soft. There is no tenderness.  Lymphadenopathy:    He has no cervical adenopathy.  Neurological: He is alert and oriented to person, place, and time. No cranial nerve deficit.  Skin: Skin is warm and dry.    Assessment/Plan: 46 yo male with pneumonia and a right parapneumonic effusion. He is currently being treated with Zyvox and Flagyl PO. Attempts to drain the  pleural fluid with ultrasound guidance have not been successful. He did have 3cc of fluid accessed on the most recent attempt. The initial gram stains were negative and the fluid had 950 WBC.  His CT on admission showed that the vast majority of the right basilar opacity on his admission CXR was due to dense consolidation of the RLL. Although his CXR suggests he may have more fluid at present, he needs a repeat CT to better evaluate the process in his right chest. CT ordered. Further suggestions after CT reviewed.  Marieclaire Bettenhausen C 04/07/2012, 6:52 PM

## 2012-04-07 NOTE — Progress Notes (Addendum)
Physical Therapy Treatment Patient Details Name: [name] [name] MRN: [id number] DOB: 11-Dec-1966 Today's Date: 04/07/2012  PT Assessment/Plan  PT - Assessment/Plan Comments on Treatment Session: Pt's was able to amb with improve balance. Pt had no LOB during amb. Pt did not have to take breaks during amb. Pt was able to amb 2 flights of stairs without the use of O2. Pt's sat stayed above 85 after stairs and improved to 89 with rest/ deep breathing. Pt was encouarged to amb with staff without O2 to improve lung tolerance. PT Plan: All goals met and education completed, patient dischaged from PT services PT Frequency: Min 3X/week Follow Up Recommendations: Home health PT PT Goals  Acute Rehab PT Goals PT Goal: Sit to Stand - Progress: Met PT Goal: Stand to Sit - Progress: Met PT Goal: Stand - Progress: Met PT Goal: Ambulate - Progress: Met PT Goal: Up/Down Stairs - Progress: Other (comment) (Pt met safe level to be DCd from PT)  PT Treatment Precautions/Restrictions  Precautions Precautions: Fall Required Braces or Orthoses DO NOT USE: No Restrictions Weight Bearing Restrictions: No Mobility (including Balance) Bed Mobility Bed Mobility: No Transfers Transfers: Yes Sit to Stand: 6: Modified independent (Device/Increase time) Stand to Sit: 6: Modified independent (Device/Increase time) Ambulation/Gait Ambulation/Gait:  (O2 tank as needed ) Ambulation/Gait Assistance: 6: Modified independent (Device/Increase time) Ambulation/Gait Assistance Details (indicate cue type and reason): Pt had no LOB. Pt amb with "waddle" gait. Ambulation Distance (Feet): 400 Feet Assistive device: None Gait Pattern: Within Functional Limits Stairs: Yes Stairs Assistance: 5: Supervision (Minguard A for saftey ) Number of Stairs: 24  (2 flights in stairwell 3rd floor) Wheelchair Mobility Wheelchair Mobility: No    Exercise    End of Session PT - End of Session Activity Tolerance: Patient  tolerated treatment well Patient left: in chair;with call bell in reach Nurse Communication: Mobility status for transfers;Mobility status for ambulation General Behavior During Session: [facility] for tasks performed Cognition: [facility] for tasks performed  Isabell Bonafede, Tonia Brooms 04/07/2012, 12:02 PM 04/07/2012 Jacqualyn Posey PTA [phone] pager [phone] office

## 2012-04-07 NOTE — Progress Notes (Signed)
Patient: [name] [name] DOB: Aug 04, 1966 Date of Admission: 03/27/2012 PCP - [name], MD            [place] [facility]  MD requesting consult:  [facility]) Reason for consult: Recurrent PNA  HPI - 46yo male with hx poorly controlled DM with recent admission for PNA and strep bacteremia c/b acute renal failure. He was d/c 3/25 with PICC for Ceftriaxone through 4/2.  He returned 4/6 with cough, SOB and R sided pleuritic chest pain.  Re-admitted by [facility] with recurrent PNA, R>L pleural effusions, DM and anemia.  PCCM consulted 4/8 for assist and ?FOB.   Culture data:  4/6: BCX2>>>neg 4/11 mouth I&D>>>neg  abx Zosyn 4/6>>>4/12 vanc 4/6>>>4/12 Levaquin (PNA - per ID) 4/12>>> Zyvox (PNA - per ID) 4/12>>>  Subjective: Denies SOB.  Still with some mild pleuritic chest pain with inspiration.    BP 172/99  Pulse 82  Temp(Src) 98.8 F (37.1 C) (Oral)  Resp 20  Ht 6\' 2"  (1.88 m)  Wt 91.3 kg (201 lb 4.5 oz)  BMI 25.84 kg/m2  SpO2 95% 2 liters  EXAM: General: pleasant male, NAD OOB in chair  HEENT: mm moist, R buccal mucosa mass improved Neuro: awake, alert, appropriate CV: s1s2 rrr PULM: resps even, nonlabored, CTA apices, diminished R>L base GI: abd soft, non tender, +bs Extremities:  Warm and dry, 1-2+ BLE edema, pale   chest X-ray Dg Chest Port 1 View  04/06/2012  *RADIOLOGY REPORT*  Clinical Data: Status post right thoracentesis.  Question pneumothorax.  PORTABLE CHEST - 1 VIEW  Comparison: Plain film chest 04/05/2012.  Findings: No pneumothorax is identified after thoracentesis.  Right greater than left pleural effusions persist.  The patient's right effusion appears somewhat decreased.  Bilateral airspace disease appears unchanged.  Heart size normal.  IMPRESSION:  1.  Negative for pneumothorax after thoracentesis. 2.  Some decrease in right effusion.  Left effusion and bilateral airspace disease persist.  Original Report Authenticated By: Arvid Right. D'ALESSIO, M.D.     CT chest 4/6>>>Right upper/lower lobe pneumonia, with associated moderate right pleural effusion. Patchy left lower lobe opacity, atelectasis versus pneumonia, with associated small left pleural effusion. No obvious endobronchial lesion  Bedside US exam 4/12: right chest examined at bedside. Very little pleural fluid. Mostly consolidated lung. Fluid collections are minimal and not amendable to draining.  Pictures in chart.   Lab 04/07/12 0620 04/06/12 0655 04/05/12 0556  NA 136 141 137  K 4.2 4.6 4.2  CL 101 105 103  CO2 26 29 26   BUN 21 22 23   CREATININE 1.57* 1.66* 1.69*  GLUCOSE 171* 71 104*    Lab 04/07/12 0620 04/06/12 0655 04/05/12 0556  HGB 8.6* 8.2* 8.3*  HCT 26.5* 25.2* 25.5*  WBC 10.7* 7.3 9.7  PLT 445* 350 339       IMPRESSION/ PLAN:  1. Recurrent PNA -- Recent strep pneumoniae PNA and bacteremia.  D/c home 3/25 with cont IV ceftriaxone through 4/2 per ID.  Worsened 4/6.  Persistent R sided PNA with associated effusion.  This could reflect insufficient treatment time given the severity of the PNA, possible empyema or abscess development. Does increase concern for underlying malignancy and 20 lbs wt loss unexplained! No clear endobronchial lesion or abscess on CT scan 4/6, he has small right effusion. Not enough fluid to tap. There is marked consolidation of the right lung by bedside US 4/12.   Lab 04/07/12 0620 04/06/12 0655 04/05/12 0556 04/04/12 0720 04/02/12 0605  WBC 10.7*  7.3 9.7 7.3 11.0*  PLAN -  Cont IV abx (now on zyvox, levaquin per ID) Thora from 4/15 likely empyema and loculated/thick fluid, will need CVTS involvement. Cont pulmonary hygiene and analgesia to assist w/ cough deep breath efforts.   2. Pleural effusions -- likely parapneumonic vs empyema post tap yesterday.  PLAN -  Cont abx - see above CVTS to see, consult called.  3. Anemia - Per primary.  S/p colonoscopy 4/10, finding:  right colon polyp, which was removed.   Plan: Hold  NSAIDS Regular diet Trend CBC Iron per heme/onc   4. DM - per primary , does he need gastroparesis treatment, work up? Will defer to primary.  5. Transaminitis - per primary   Lab 04/07/12 0620 04/06/12 0655 04/04/12 0720 04/03/12 1120 04/03/12 0630  AST 26 29 69* 132* 161*  ALT 48 61* 101* 141* 128*  ALKPHOS 909* 1010* 1151* 1491* 1356*  BILITOT 0.4 0.5 0.4 0.6 0.6  PROT 7.0 6.7 6.5 6.5 6.7  ALBUMIN 2.0* 2.0* 1.8* 1.9* 1.8*  INR -- -- -- -- --   6. Buccal abscess s/p I&D on 4/11.  Cultures negative  Plan Abx per ID  Jennet Maduro, MD 04/07/2012  1:15 PM Pager: [phone]

## 2012-04-07 NOTE — Progress Notes (Signed)
Subjective: States thoracocentesis was very painful he is anxious to hear from CVTS today   Antibiotics:  Anti-infectives     Start     Dose/Rate Route Frequency Ordered Stop   04/03/12 1400   levofloxacin (LEVAQUIN) IVPB 750 mg  Status:  Discontinued        750 mg 100 mL/hr over 90 Minutes Intravenous Every 24 hours 04/03/12 1155 04/06/12 1119   04/03/12 1200   metroNIDAZOLE (FLAGYL) IVPB 500 mg        500 mg 100 mL/hr over 60 Minutes Intravenous Every 8 hours 04/03/12 1145 04/04/12 1159   04/03/12 1200   linezolid (ZYVOX) tablet 600 mg        600 mg Oral Every 12 hours 04/03/12 1145     03/31/12 2000   piperacillin-tazobactam (ZOSYN) IVPB 3.375 g  Status:  Discontinued        3.375 g 12.5 mL/hr over 240 Minutes Intravenous Every 8 hours 03/31/12 1841 04/03/12 1145   03/28/12 1000   vancomycin (VANCOCIN) IVPB 1000 mg/200 mL premix  Status:  Discontinued        1,000 mg 200 mL/hr over 60 Minutes Intravenous Every 8 hours 03/28/12 0715 04/03/12 1008   03/28/12 1000   piperacillin-tazobactam (ZOSYN) IVPB 3.375 g  Status:  Discontinued        3.375 g 12.5 mL/hr over 240 Minutes Intravenous 3 times per day 03/28/12 0726 03/31/12 1841   03/28/12 0200   vancomycin (VANCOCIN) IVPB 1000 mg/200 mL premix        1,000 mg 200 mL/hr over 60 Minutes Intravenous  Once 03/28/12 0148 03/28/12 0413   03/28/12 0200  piperacillin-tazobactam (ZOSYN) IVPB 3.375 g       3.375 g 12.5 mL/hr over 240 Minutes Intravenous  Once 03/28/12 0148 03/28/12 0626          Medications: Scheduled Meds:    . albuterol  2.5 mg Nebulization TID  . amLODipine  5 mg Oral Daily  . atorvastatin  20 mg Oral q1800  . docusate sodium  100 mg Oral BID  . ferrous sulfate  325 mg Oral BID WC  . guaiFENesin  1,200 mg Oral BID  . insulin aspart  0-9 Units Subcutaneous TID WC  . insulin glargine  20 Units Subcutaneous QHS  . lidocaine  2 patch Transdermal Q24H  . linezolid  600 mg Oral Q12H  . metoprolol  tartrate  50 mg Oral BID  . senna  1 tablet Oral BID  . sodium chloride  3 mL Intravenous Q12H  . DISCONTD: albuterol  2.5 mg Nebulization Q6H  . DISCONTD: docusate sodium  200 mg Oral Daily   Continuous Infusions:  PRN Meds:.sodium chloride, acetaminophen, acetaminophen, albuterol, HYDROcodone-acetaminophen, morphine, ondansetron (ZOFRAN) IV, ondansetron, oxyCODONE, sodium chloride   Objective: Weight change:   Intake/Output Summary (Last 24 hours) at 04/07/12 1650 Last data filed at 04/07/12 1500  Gross per 24 hour  Intake    480 ml  Output      0 ml  Net    480 ml   Blood pressure 137/86, pulse 67, temperature 98.8 F (37.1 C), temperature source Oral, resp. rate 20, height 6\' 2"  (1.88 m), weight 201 lb 4.5 oz (91.3 kg), SpO2 96.00%. Temp:  [98.3 F (36.8 C)-98.8 F (37.1 C)] 98.8 F (37.1 C) (04/16 1624) Pulse Rate:  [67-88] 67  (04/16 1624) Resp:  [18-20] 20  (04/16 1624) BP: (137-172)/(84-99) 137/86 mmHg (04/16 1624) SpO2:  [92 %-96 %] 96 % (04/16  1624)  Physical Exam: General: Alert and awake, oriented x3, not in any acute distress. HEENT: anicteric sclera, pupils reactive to light and accommodation, EOMI CVS regular rate, normal r,  no murmur rubs or gallops Chest: clear to auscultation bilaterally, no wheezing, rales or rhonchi Abdomen: soft nontender, nondistended, normal bowel sounds, Extremities: no  clubbing or edema noted bilaterally Skin: no rashes Lymph: no new lymphadenopathy Neuro: nonfocal  Lab Results:  Basename 04/07/12 0620 04/06/12 0655  WBC 10.7* 7.3  HGB 8.6* 8.2*  HCT 26.5* 25.2*  PLT 445* 350    BMET  Basename 04/07/12 0620 04/06/12 0655  NA 136 141  K 4.2 4.6  CL 101 105  CO2 26 29  GLUCOSE 171* 71  BUN 21 22  CREATININE 1.57* 1.66*  CALCIUM 8.7 8.9    Micro Results: Recent Results (from the past 240 hour(s))  WOUND CULTURE     Status: Normal   Collection Time   04/02/12 12:30 PM      Component Value Range Status Comment     Specimen Description WOUND MOUTH RIGHT   Final    Special Requests ORAL MUCOSA   Final    Gram Stain     Final    Value: FEW WBC PRESENT, PREDOMINANTLY PMN     NO SQUAMOUS EPITHELIAL CELLS SEEN     NO ORGANISMS SEEN   Culture NO GROWTH 2 DAYS   Final    Report Status 04/04/2012 FINAL   Final   BODY FLUID CULTURE     Status: Normal (Preliminary result)   Collection Time   04/06/12  2:40 PM      Component Value Range Status Comment   Specimen Description FLUID PLEURAL   Final    Special Requests 1 TUBE @ 2.2CC   Final    Gram Stain     Final    Value: MODERATE WBC PRESENT, PREDOMINANTLY PMN     NO ORGANISMS SEEN   Culture NO GROWTH   Final    Report Status PENDING   Incomplete     Studies/Results: Dg Chest Port 1 View  04/06/2012  *RADIOLOGY REPORT*  Clinical Data: Status post right thoracentesis.  Question pneumothorax.  PORTABLE CHEST - 1 VIEW  Comparison: Plain film chest 04/05/2012.  Findings: No pneumothorax is identified after thoracentesis.  Right greater than left pleural effusions persist.  The patient's right effusion appears somewhat decreased.  Bilateral airspace disease appears unchanged.  Heart size normal.  IMPRESSION:  1.  Negative for pneumothorax after thoracentesis. 2.  Some decrease in right effusion.  Left effusion and bilateral airspace disease persist.  Original Report Authenticated By: Arvid Right. Luther Parody, M.D.      Assessment/Plan: [name] [name] is a 46 y.o. male with recent pneumococcal bacteremia and pneumonia readmitted with apparent healthcare associated pneumonia and a buccal abscess.. He had elevationg in liver fxn tests in particular alk phosph ? Cause. He had thoracocentesis yesterday with brown, yelllow fluid removed 900 cells no organisms seen  1) Parapneumonic effusion: LB CCM concerned for the areas that appear organized and not drainable. He is going to be seen by CVTS for consideration of thoracoscopic surgery to clean up the pleural  space --continue zyvox and add flagyl   2) Buccal abscess: Dr. Hoyt Koch thinks this is due to parotid disease which would make MSSA MRSA more likely culprits. Still need to keep in mid odontogenic flora as well. He is well covered with current regimen.  --continue zyvox + flagyl --may need  to reimage down the road --he also needs to be seen by a dentist and oral surgeon as oupt      LOS: 11 days   Alcide Evener 04/07/2012, 4:50 PM

## 2012-04-07 NOTE — Progress Notes (Signed)
Gastroenterology Progress Note  Subjective: No GI complaints. Ready to walk with PT. Feels better than yesterday  Objective:  Vital signs in last 24 hours: Temp:  [98.5 F (36.9 C)-98.8 F (37.1 C)] 98.8 F (37.1 C) (04/16 0519) Pulse Rate:  [81-88] 82  (04/16 0519) Resp:  [18-20] 20  (04/16 0519) BP: (144-172)/(84-99) 172/99 mmHg (04/16 0519) SpO2:  [94 %-95 %] 95 % (04/16 0519) Last BM Date: 04/01/12 (Note left for MD)  General:   Alert,  In pain and distress from recent Thoracentesis. Heart:  Regular rate and rhythm; no murmurs Abdomen:  Soft, nontender and nondistended. Normal bowel sounds, without guarding, and without rebound.   Extremities:  Without edema. Neurologic:  Alert and  oriented x4;  grossly normal neurologically. Psych:  Alert and cooperative.  Lab Results:  Basename 04/07/12 0620 04/06/12 0655 04/05/12 0556  WBC 10.7* 7.3 9.7  HGB 8.6* 8.2* 8.3*  HCT 26.5* 25.2* 25.5*  PLT 445* 350 339   BMET  Basename 04/07/12 0620 04/06/12 0655 04/05/12 0556  NA 136 141 137  K 4.2 4.6 4.2  CL 101 105 103  CO2 26 29 26   GLUCOSE 171* 71 104*  BUN 21 22 23   CREATININE 1.57* 1.66* 1.69*  CALCIUM 8.7 8.9 8.6   LFT  Basename 04/07/12 0620  PROT 7.0  ALBUMIN 2.0*  AST 26  ALT 48  ALKPHOS 909*  BILITOT 0.4  BILIDIR --  IBILI --   Studies/Results: Dg Chest Port 1 View  04/06/2012  *RADIOLOGY REPORT*  Clinical Data: Status post right thoracentesis.  Question pneumothorax.  PORTABLE CHEST - 1 VIEW  Comparison: Plain film chest 04/05/2012.  Findings: No pneumothorax is identified after thoracentesis.  Right greater than left pleural effusions persist.  The patient's right effusion appears somewhat decreased.  Bilateral airspace disease appears unchanged.  Heart size normal.  IMPRESSION:  1.  Negative for pneumothorax after thoracentesis. 2.  Some decrease in right effusion.  Left effusion and bilateral airspace disease persist.  Original Report Authenticated By:  Arvid Right. Luther Parody, M.D.   Assessment / Plan: 1) Elevated AP, intrahepatic cholestasis. US shows GB sludge, no biliary dilation. Serologies so far are negative, several are pending.  2) LA Grade C esophagitis. Continue PPI. 3) Pneumonia.  4) Increased creatinine 5) Anemia.  Alk Phos is trending down slowly- 909<1010<1151<1491<1356<714. Still unclear etiology. Alpha 1 Antitrypsin- elevated (not low). ANA is positive.  ACE levels normal( low likelyhood of Sarcoid). AMA and ASMA pending. Wait for thoracentesis results- done yesterday. - Alk Phos trending down. No new GI recs. Follow serial LFT's and will follow him as out pt.  Principal Problem:  *Community acquired pneumonia Active Problems:  Diabetes mellitus  Acute kidney failure  Elevated LFTs  Bacteremia due to Streptococcus pneumoniae  Hyponatremia  Bilateral leg edema  Cyst of skin  Parapneumonic effusion  Anemia  Hypoalbuminemia  Total bilirubin, elevated   LOS: 11 days   PATEL,RAVI MD  04/07/2012, 10:55 AM  The patient was not in the room when I came to evaluate him.  I am not clear if the AP elevation is from the liver as his GGT is only mildly elevated and is discordant with the large degree of elevation in his AP.  At this time I think he will be best served with a follow up in the office and his other medical issues can be resolved.  Signing off.

## 2012-04-08 LAB — COMPREHENSIVE METABOLIC PANEL
ALT: 48 U/L (ref 0–53)
AST: 41 U/L — ABNORMAL HIGH (ref 0–37)
Albumin: 2 g/dL — ABNORMAL LOW (ref 3.5–5.2)
Alkaline Phosphatase: 829 U/L — ABNORMAL HIGH (ref 39–117)
BUN: 19 mg/dL (ref 6–23)
CO2: 23 mEq/L (ref 19–32)
Calcium: 8.7 mg/dL (ref 8.4–10.5)
Chloride: 105 mEq/L (ref 96–112)
Creatinine, Ser: 1.5 mg/dL — ABNORMAL HIGH (ref 0.50–1.35)
GFR calc Af Amer: 63 mL/min — ABNORMAL LOW (ref >90)
GFR calc non Af Amer: 55 mL/min — ABNORMAL LOW (ref >90)
Glucose, Bld: 97 mg/dL (ref 70–99)
Potassium: 4.2 mEq/L (ref 3.5–5.1)
Sodium: 138 mEq/L (ref 135–145)
Total Bilirubin: 0.3 mg/dL (ref 0.3–1.2)
Total Protein: 6.9 g/dL (ref 6.0–8.3)

## 2012-04-08 LAB — CBC
HCT: 27 % — ABNORMAL LOW (ref 39.0–52.0)
Hemoglobin: 8.9 g/dL — ABNORMAL LOW (ref 13.0–17.0)
MCH: 28.2 pg (ref 26.0–34.0)
MCHC: 33 g/dL (ref 30.0–36.0)
MCV: 85.4 fL (ref 78.0–100.0)
Platelets: 476 10*3/uL — ABNORMAL HIGH (ref 150–400)
RBC: 3.16 MIL/uL — ABNORMAL LOW (ref 4.22–5.81)
RDW: 14.4 % (ref 11.5–15.5)
WBC: 10 10*3/uL (ref 4.0–10.5)

## 2012-04-08 LAB — GLUCOSE, CAPILLARY
Glucose-Capillary: 89 mg/dL (ref 70–99)
Glucose-Capillary: 102 mg/dL — ABNORMAL HIGH (ref 70–99)
Glucose-Capillary: 105 mg/dL — ABNORMAL HIGH (ref 70–99)

## 2012-04-08 MED ORDER — ZOLPIDEM TARTRATE 5 MG PO TABS: 5.0000 mg | ORAL_TABLET | Freq: Every evening | ORAL | Status: DC | PRN

## 2012-04-08 NOTE — Progress Notes (Signed)
[name] [name] CSN:[id number],MRN:[id number] is a 46 y.o. male,  Outpatient Primary MD for the patient is [name], MD, MD  Chief Complaint  Patient presents with  . Emesis        Subjective:    [name] today has, No headache, No chest pain, No abdominal pain - No Nausea, No new weakness tingling or numbness, No Cough, he continues to have some right-sided pressure and exertional SOB . Able to ambulate to supply him without any specific distress although is using oxygen. Is concerned a little bit about procedure tomorrow but ready to get it done. Wishes to know where he will be discharged to.  Objective:   Filed Vitals:   04/07/12 1624 04/07/12 2100 04/08/12 0529 04/08/12 0551  BP: 137/86 168/95 174/103 152/84  Pulse: 67 85 84 80  Temp: 98.8 F (37.1 C) 98.2 F (36.8 C) 98 F (36.7 C)   TempSrc:  Oral Oral   Resp: 20 18 18    Height:      Weight:      SpO2: 96% 95% 94%     Wt Readings from Last 3 Encounters:  04/04/12 91.3 kg (201 lb 4.5 oz)  04/04/12 91.3 kg (201 lb 4.5 oz)  04/04/12 91.3 kg (201 lb 4.5 oz)    No intake or output data in the 24 hours ending 04/08/12 1708  Exam Awake Alert, Oriented *3, No new F.N deficits, Normal affect [place].AT,PERRAL. Right-sided facial swelling. Supple Neck,No JVD, No cervical lymphadenopathy appriciated.  Symmetrical Chest wall movement, Good air movement bilaterally. RRR,No Gallops,Rubs or new Murmurs, No Parasternal Heave +ve B.Sounds, Abd Soft, Non tender, No organomegaly appriciated, No rebound -guarding or rigidity. No Cyanosis, Clubbing , 1+edema, No new Rash or bruise    Data Review   CBC  Lab 04/08/12 0650 04/07/12 0620 04/06/12 0655 04/05/12 0556 04/04/12 0720  WBC 10.0 10.7* 7.3 9.7 7.3  HGB 8.9* 8.6* 8.2* 8.3* 7.6*  HCT 27.0* 26.5* 25.2* 25.5* 23.2*  PLT 476* 445* 350 339 261  MCV 85.4 88.0 88.4 88.5 87.2  MCH 28.2 28.6 28.8 28.8 28.6  MCHC 33.0 32.5 32.5 32.5 32.8  RDW 14.4 14.8 14.6 14.5  14.6  LYMPHSABS -- -- -- -- --  MONOABS -- -- -- -- --  EOSABS -- -- -- -- --  BASOSABS -- -- -- -- --  BANDABS -- -- -- -- --    Chemistries   Lab 04/08/12 0650 04/07/12 0620 04/06/12 0655 04/05/12 0556 04/04/12 0720 04/03/12 1120  NA 138 136 141 137 139 --  K 4.2 4.2 4.6 4.2 3.9 --  CL 105 101 105 103 102 --  CO2 23 26 29 26 29  --  GLUCOSE 97 171* 71 104* 116* --  BUN 19 21 22 23  25* --  CREATININE 1.50* 1.57* 1.66* 1.69* 1.60* --  CALCIUM 8.7 8.7 8.9 8.6 8.4 --  MG -- -- -- -- -- --  AST 41* 26 29 -- 69* 132*  ALT 48 48 61* -- 101* 141*  ALKPHOS 829* 909* 1010* -- 1151* 1491*  BILITOT 0.3 0.4 0.5 -- 0.4 0.6   ------------------------------------------------------------------------------------------------------------------ estimated creatinine clearance is 72.3 ml/min (by C-G formula based on Cr of 1.5). ------------------------------------------------------------------------------------------------------------------ No results found for this basename: HGBA1C:2 in the last 72 hours ------------------------------------------------------------------------------------------------------------------ No results found for this basename: CHOL:2,HDL:2,LDLCALC:2,TRIG:2,CHOLHDL:2,LDLDIRECT:2 in the last 72 hours ------------------------------------------------------------------------------------------------------------------ No results found for this basename: TSH,T4TOTAL,FREET3,T3FREE,THYROIDAB in the last 72 hours ------------------------------------------------------------------------------------------------------------------ No results found for this basename: VITAMINB12:2,FOLATE:2,FERRITIN:2,TIBC:2,IRON:2,RETICCTPCT:2 in the last 72 hours  Coagulation profile No results found for this basename: INR:5,PROTIME:5 in the last 168 hours  No results found for this basename: DDIMER:2 in the last 72 hours  Cardiac Enzymes No results found for this basename:  CK:3,CKMB:3,TROPONINI:3,MYOGLOBIN:3 in the last 168 hours ------------------------------------------------------------------------------------------------------------------ No components found with this basename: POCBNP:3  Micro Results Recent Results (from the past 240 hour(s))  WOUND CULTURE     Status: Normal   Collection Time   04/02/12 12:30 PM      Component Value Range Status Comment   Specimen Description WOUND MOUTH RIGHT   Final    Special Requests ORAL MUCOSA   Final    Gram Stain     Final    Value: FEW WBC PRESENT, PREDOMINANTLY PMN     NO SQUAMOUS EPITHELIAL CELLS SEEN     NO ORGANISMS SEEN   Culture NO GROWTH 2 DAYS   Final    Report Status 04/04/2012 FINAL   Final   BODY FLUID CULTURE     Status: Normal (Preliminary result)   Collection Time   04/06/12  2:40 PM      Component Value Range Status Comment   Specimen Description FLUID PLEURAL   Final    Special Requests 1 TUBE @ 2.2CC   Final    Gram Stain     Final    Value: MODERATE WBC PRESENT, PREDOMINANTLY PMN     NO ORGANISMS SEEN   Culture NO GROWTH 1 DAY   Final    Report Status PENDING   Incomplete     Radiology Reports Dg Chest 2 View  03/28/2012  *RADIOLOGY REPORT*  Clinical Data: Cough, right rib pain, emesis, recent pneumonia  CHEST - 2 VIEW  Comparison: 03/16/2012  Findings: Patchy right lower lobe opacity, suspicious for pneumonia, with associated small-to-moderate right pleural effusion.  Left basilar opacity, atelectasis versus pneumonia, with suspected small left pleural effusion.  No pneumothorax.  The heart is normal in size.  Interval removal of right subclavian PICC.  Visualized osseous structures are within normal limits.  IMPRESSION: Patchy right lower lobe opacity, suspicious for pneumonia, with associated small-to-moderate right pleural effusion.  Left basilar opacity, atelectasis versus pneumonia, with suspected small left pleural effusion.  Original Report Authenticated By: Julian Hy, M.D.    Dg Chest 2 View  03/16/2012  *RADIOLOGY REPORT*  Clinical Data: History of pneumonia.  CHEST - 2 VIEW  Comparison: 03/11/2012.  Findings: Tip of right PICC terminates in right atrial region.  No pneumothorax is seen.  There is some atelectasis and infiltrate in the medial posterior aspect of the left lung.  There are atelectasis and infiltrative densities seen within the right perihilar region and within the right midlung and right lower lung areas with consolidation and atelectasis.  There is improvement in the amount of infiltrate compared to previous study.   On the lateral image there is continued abnormal opacity inferiorly and posteriorly.  Indistinctness of costophrenic angles may reflect an element of pleural effusion.  IMPRESSION: Right PICC in place.  Some atelectasis and infiltrative density in the medial posterior left lung inferiorly. here are atelectasis and infiltrative densities seen within the right perihilar region and within the right midlung and right lower lung areas with consolidation and atelectasis.  There is improvement in the amount of infiltrative compared to previous study but clearing has not occurred. Question small amounts of pleural effusion.  Original Report Authenticated By: Delane Ginger, M.D.   Dg Chest 2 View  03/11/2012  *RADIOLOGY REPORT*  Clinical Data: Shortness of breath with right lower chest pain and fever.  CHEST - 2 VIEW  Comparison: None.  Findings: Trachea is midline.  Heart size normal.  There is dense airspace consolidation in the right lower lobe.  Question mild left lower lobe air space disease.  No left pleural fluid.  IMPRESSION:  1.  Dense airspace consolidation in the right lower lobe is most consistent with pneumonia.  Follow-up to clearing is recommended, to exclude a centrally obstructing mass. 2.  Question mild left lower lobe air space disease.  Original Report Authenticated By: Luretha Rued, M.D.   Ct Chest Wo Contrast  03/28/2012  *RADIOLOGY  REPORT*  Clinical Data: Cough, right-sided chest pain  CT CHEST WITHOUT CONTRAST  Technique:  Multidetector CT imaging of the chest was performed following the standard protocol without IV contrast.  Comparison: Chest radiographs dated 03/28/2012.  CT chest dated 03/11/2012.  Findings: Patchy posterior right upper lobe opacity and near complete right lower lobe consolidation, suspicious for pneumonia. Associated moderate right pleural effusion.  Patchy left lower lobe opacity, atelectasis versus pneumonia, with associated small left pleural effusion.  No pneumothorax.  Heart is normal in size.  Small pericardial effusion.  Visualized upper abdomen is unremarkable.  Mild degenerative changes of the visualized thoracolumbar spine.  IMPRESSION: Right upper/lower lobe pneumonia, with associated moderate right pleural effusion.  Patchy left lower lobe opacity, atelectasis versus pneumonia, with associated small left pleural effusion.  Original Report Authenticated By: Julian Hy, M.D.   Ct Angio Chest W/cm &/or Wo Cm  03/11/2012  *RADIOLOGY REPORT*  Clinical Data: Fever.  Cough.  Weakness.  Short of breath.  CT ANGIOGRAPHY CHEST  Technique:  Multidetector CT imaging of the chest using the standard protocol during bolus administration of intravenous contrast. Multiplanar reconstructed images including MIPs were obtained and reviewed to evaluate the vascular anatomy.  Contrast: 2mL OMNIPAQUE IOHEXOL 350 MG/ML IV SOLN  Comparison: Chest radiography same day  Findings: Pulmonary arterial opacification is excellent.  There are no pulmonary emboli.  There is consolidative pneumonia throughout the right lower lobe with minor involvement of the right upper lobe.  There is consolidative pneumonia less extensively in the posterior aspect of the left lower lobe.  There is a small amount of pleural fluid on the right.  There is a tiny amount of pericardial fluid.  There are small mediastinal lymph nodes, likely reactive.   No upper abdominal pathology is seen.  IMPRESSION: No pulmonary emboli.  Consolidative pneumonia throughout the right lower lobe.  Partial involvement of the left lower lobe by consolidative  pneumonia. Minimal involvement of the right upper lobe.  Original Report Authenticated By: Jules Schick, M.D.   Nm Hepatobiliary  03/14/2012  *RADIOLOGY REPORT*  Clinical Data: Abdominal pain, evaluate for cholecystitis  NUCLEAR MEDICINE HEPATOHBILIARY INCLUDE GB  Radiopharmaceutical:  5.5 mCi technetium 40m Choletec  Comparison: Ultrasound dated 03/12/2012.  Findings: Normal hepatic uptake.  Small bowel is visible within 20 minutes.  Gallbladder is visible within 30 minutes, confirming cystic duct patency.  IMPRESSION: Normal hepatobiliary nuclear medicine scan.  Original Report Authenticated By: Julian Hy, M.D.   [place] Chest  03/28/2012  *RADIOLOGY REPORT*  Clinical Data: 46 year old male with pneumonia and pleural effusions on chest CT.  Assess for thoracentesis.  CHEST ULTRASOUND  Comparison: Chest CT without contrast 03/28/2012 and earlier.  Findings: Gray-scale imaging of the right hemithorax demonstrates a small component of pleural fluid, with a larger adjacent component of consolidated lung.  No free  fluid seen subjacent to the right hemidiaphragm.  The quantity of pleural fluid identified is insufficient for thoracentesis.  IMPRESSION: Right pleural effusion and larger component of right lung consolidation on ultrasound.  Pleural fluid component quantity insufficient for thoracentesis at this time.  Original Report Authenticated By: Randall An, M.D.   US Abdomen Complete  03/28/2012  *RADIOLOGY REPORT*  Clinical Data:  Elevated LFTs.  Right upper quadrant pain and nausea.  ABDOMINAL ULTRASOUND COMPLETE  Comparison:  03/12/2012  Findings:  Gallbladder:  No gallstones, gallbladder wall thickening, or pericholecystic fluid. Negative sonographic Murphy's sign reported by the sonographer.  Common Bile  Duct:  Within normal limits in caliber.  Liver: No focal mass lesion identified.  Within normal limits in parenchymal echogenicity.  IVC:  Appears normal.  Pancreas:  No abnormality identified.  Spleen:  Within normal limits in size and echotexture.  Right kidney:  Normal in size and parenchymal echogenicity.  No evidence of mass or hydronephrosis.  Left kidney:  Normal in size and parenchymal echogenicity.  No evidence of mass or hydronephrosis.  Abdominal Aorta:  No aneurysm identified.  Other findings:  Small bilateral pleural effusions.  Small amount of upper abdominal ascites posterior to the liver and spleen.  IMPRESSION:  1.  Small amount of abdominal ascites. 2.  Small pleural effusions.  Original Report Authenticated By: Angelita Ingles, M.D.   US Abdomen Complete  03/12/2012  *RADIOLOGY REPORT*  Clinical Data:  Right upper quadrant abdominal pain, elevated liver function tests  ABDOMINAL ULTRASOUND COMPLETE  Comparison:  Chest CT 03/11/2012 with incomplete visualization of the liver  Findings:  Gallbladder:  Minimal dependent sludge is noted within the gallbladder.  The gallbladder is not distended, with gallbladder wall thickness measuring borderline thickened at 4 mm.  No shadowing calculus or sonographic Murphy's sign noted.  Common Bile Duct:  Within normal limits in caliber.  Liver: No focal mass lesion identified.  Within normal limits in parenchymal echogenicity.  IVC:  Appears normal.  Pancreas:  No abnormality identified.  Spleen:  Within normal limits in size and echotexture.  Right kidney:  Normal in size and parenchymal echogenicity.  No evidence of mass or hydronephrosis.  Left kidney:  Normal in size and parenchymal echogenicity.  No evidence of mass or hydronephrosis.  Abdominal Aorta:  Small amount of ascites is noted.  Trace left pleural effusion.  IMPRESSION: Minimal sludge within the gallbladder with borderline wall thickening.  This could indicate cholecystitis in the appropriate  clinical context.  Trace ascites.  Original Report Authenticated By: Arline Asp, M.D.   Ct Maxillofacial W/cm  04/01/2012  *RADIOLOGY REPORT*  Clinical Data: Swelling on the right for several weeks.  Jaw pain.  CT MAXILLOFACIAL WITH CONTRAST  Technique:  Multidetector CT imaging of the maxillofacial structures was performed with intravenous contrast. Multiplanar CT image reconstructions were also generated.  Contrast: 25mL OMNIPAQUE IOHEXOL 300 MG/ML  SOLN  Comparison: None.  Findings: In the right buccal region, there is a complex fluid collection spanning over 5 x 2.1 x 1.1 cm.  This is immediately adjacent to the significant dental disease.  Mild cervical spondylotic changes.  Visualized mastoid air cells, middle ear cavities and paranasal sinuses are clear.  IMPRESSION: Probable abscess right buccal region most likely dental in origin given the degree of significant caries and periapical lucency of the molars.  Results called to Bari Mantis patient's nurse 04/01/2012 3:20 a.m. with request to call report first in the morning.  Original Report Authenticated  By: Doug Sou, M.D.   Dg Chest Port 1 View  03/31/2012  *RADIOLOGY REPORT*  Clinical Data: Assess infiltrate.  PORTABLE CHEST - 1 VIEW  Comparison: 03/28/2012  Findings: Very low lung volumes.  Worsening bibasilar opacities and probable edema.  Bilateral effusions.  Cardiomegaly.  IMPRESSION: Increasing pulmonary edema, bibasilar opacities and effusions. Very low lung volumes.  Original Report Authenticated By: Raelyn Number, M.D.    Echo  - Left ventricle: The cavity size was normal. Wall thickness was increased in a pattern of mild LVH. Systolic function was normal. The estimated ejection fraction was in the range of 55% to 60%. - Atrial septum: No defect or patent foramen ovale was identified. - Pericardium, extracardiac: Small pericardial effusion with no evidence of tamponade    Scheduled Meds:    . amLODipine  5 mg  Oral Daily  . atorvastatin  20 mg Oral q1800  . docusate sodium  100 mg Oral BID  . ferrous sulfate  325 mg Oral BID WC  . guaiFENesin  1,200 mg Oral BID  . insulin aspart  0-9 Units Subcutaneous TID WC  . insulin glargine  20 Units Subcutaneous QHS  . lidocaine  2 patch Transdermal Q24H  . linezolid  600 mg Oral Q12H  . metoprolol tartrate  50 mg Oral BID  . metroNIDAZOLE  500 mg Oral Q8H  . senna  1 tablet Oral BID  . sodium chloride  3 mL Intravenous Q12H  . DISCONTD: albuterol  2.5 mg Nebulization TID   Continuous Infusions:   PRN Meds:.sodium chloride, acetaminophen, acetaminophen, albuterol, HYDROcodone-acetaminophen, morphine, ondansetron (ZOFRAN) IV, ondansetron, oxyCODONE, sodium chloride  Assessment & Plan   Brief narrative:  46 year old man recently treated for streptococcus pneumoniae bacteremia and pneumonia , anemia, right-sided facial swelling and elevated liver enzymes along with acute renal failure during his first hospitalization about 2 weeks ago was admitted on 03/12/2012 and discharged on 03/17/2012, patient had received a PICC line during his first admission and was then discharged on IV Rocephin to home as recommended by ID physician Dr. Linus Salmons.  He presented back to the hospital 03/28/2010 with fever and chills, this time he was diagnosed with buccal abscess, along with bilateral pleural effusions with possible right-sided parapneumonic effusion, persistently elevated liver enzymes despite negative hepatitis panel , mild gallbladder sludge on ultrasound but with negative HIDA, he was being seen by GI, pulmonary, ID, renal, hematology.  CT scan of the abdomen and pelvis which was done on April 14 by hematology short moderate to large sized bilateral pleural effusions, patient did have right-sided pneumonia and right-sided pleuritic chest pain throughout his hospital stay so pulmonary was the cord on the 15th, I do ultrasound-guidance thoracentesis was attempted on the  15th by fibrofatty which he is only 5 cc of fluid brown in color, showing large amount of WBCs and moderate neutrophils, further studies are pending, will wait for pulmonary and ID followup in the future course of action i.e. antibiotics/chest tube depending on that recommendation. I have also discussed with the patient that it is a [facility] he continues to require IV antibiotics +/- chest tube placement if deemed necessary by pulmonary.  His antibiotics have been switched around by infectious disease physicians, he continues to have moderate to severe anemia probably anemia of chronic disease along with anemia of acute disease in combination with iron deficiency worsened by frequent lab draws, his iron has been replaced intravenously and now  is on oral supplements, has been seen by hematology and is being followed up by them, GI has ordered further immunological testing for elevated transaminases, pulmonary has evaluated him several times for his right-sided parapneumonic effusion however effusion size is too small to be tapped who on ultrasound, He has redeveloped mild non-oliguric acute renal insufficiency again with negative ultrasound, this is likely due to vancomycin and anemia, vancomycin has been stopped patient is improving post gentle IV fluids. His edema has improved thereafter Lasix has been stopped also. His creatinine is starting to improve gently.  He has elevated LFTs with positive ANA, right upper quadrant ultrasound x2 along with high-dose scan has been negative no clinical signs of acute cholecystitis, patient is being followed by Dr.Mann and he will need to follow with GI upon discharge in about 3 weeks for followup on Serological tests ordered by Dr. Collene Mares, and colonoscopy and EGD biopsy results.  He also had left I visual field defect i.e. loose body in his left eye for which he was seen by ophthalmologist Dr.Tanner who diagnosed him  with Proliferative diabetic retinopathy with macular edema and vitreous hemorrhage and will need continued outpatient IV followup along with aggressive risk factor modification i.e. Diabetes, hypertension dyslipidemia.    Past medical history:  Diabetes mellitus    Consultations done during hospitalization: And followups needed post discharge  Pulmonology -Dr. Lamonte Sakai Gastroenterology -Dr. Collene Mares ENT (no followup needed) Oral Surgery - Dr. Hoyt Koch Hematology - Dr Jamse Arn Tanner-Opthalmology - will need outpatient Retina specialist to followup CTS called for VATS 04/07/2012 PT    Procedures:  April 9: Upper endoscopy: LA grade C esophagitis. Protonix 40 mg by mouth twice a day- Dr Benson [place] April 10: Colonoscopy - Dr Collene Mares April 11: I&D of Rt Parotid Abscess - Dr Hoyt Koch   Antibiotics:  April 6: Vancomycin  April 6: Zosyn ABX on April 12th switched to Zyvox-Levaquin-Flagyl      #1. 46 y.o. male admitted on 03/27/2012, with recurrent pneumonia along with pleural effusion concerning for empyema, recent admission for Streptococcus pneumoniae bacteremia and was discharged on IV Rocephin, now comes back with fever chills. ID now changed ABX to Zyvox-Lavaquin and Falgyl due to ? Marrow suppression and ARF on 12th, D/W Dr Wendie Agreste, will monitor. Patient continues to have quite a bit of oxygen demand along with exertional shortness of breath, off note CT abdomen done on 04/05/2012 by Haem-Onc shows significantly large amounts of bilateral pleural effusions,, a thoracentesis was attempted on 04/06/2012 which he did 5-10 cc of brown colored fluid, with large amount of WBCs and neutrophils-patient will be seen tomorrow for possible VAT surgery by Dr. Lauralee Evener appreciated  #2.Mixed pattern elevated LFTs: Etiology not clear. Abdominal ultrasound unremarkable on the 6th, hepatitis panel negative and recent HIDA scan was negative. ?? Possibly related to effusion. Has bumped up again on 12th, GI  following patient, repeat US shows evidence of gallbladder sludge I discussed the case personally with surgeon on call Dr. Sharlett Iles who recommended no surgical intervention given negative ultrasound, with recent negative HIDA scan, echogram shows mild LV hypertrophy but no frank acute heart failure-trending downwards  #3. Hyponatremia: Stable. Likely related to current pneumonia/pulmonary process. Resolved   #4. Anemia: Likely combination of anemia of chronic disease, iron deficiency, anemia acute disease. No history of bleeding. Fecal occult blood negative. No previous data prior to last hospitalization when he presented with hemoglobin of 10 which quickly decreased, probably from dilution also could be a medication effect. His anemia panel  does show severely low iron although ferritin is stable (however it is a acute phase reactant) . Appreciate gastroenterology evaluation, EGD showing grade C. esophagitis placed on PPI, limited colonoscopy also unremarkable, stable post 1 unit on 11th, ? Marrow suppression from Zosyn + AOAD  and iron deficiency, IV iron being given on recommendation of hematology we'll continue to monitor H&H which is improving, appreciate hematology input.  #5. Diabetes mellitus type 2, uncontrolled, A1c 15: Continue sliding scale insulin, Lantus, meal coverage, due to acute renal insufficiency patient's insulin dose has been reduced on 04/05/2012, few episodes of borderline hypoglycemia were noted-blood sugars between 100-105  CBG (last 3)   Basename 04/08/12 1144 04/08/12 0659 04/07/12 2057  GLUCAP 102* 89 224*   #6. Right buccal mucosa mass: This developed during his last hospitalization. Was seen by ENT and oral surgery, underwent I&D of a right parotid abscess by Dr. Hoyt Koch on 04/02/2012, antibiotics as #1 above.  #7. Acute renal failure with edema - renal failure worse today along with LFTs, could have been due to combination of vancomycin and anemia, [place] stable, FeNa 1.37,  urine does show mild proteinuria and since patient is diabetic we'll try to place him on ACE. but once creatinine is stable will need outpatient renal follow up. Continue to hold lasix, post gentle IVF, vancomycin stopped 03-03-12. Have discussed the case on 04/05/2012 with Dr. Jonnie Finner nephrologist on call who does not recommend any change in  plan of care at this time, most likely this ATN which was improved in due course of time. Patient likely has underlying CKD due to his diabetes which was an extremely poor control as evidenced by his A1c of 15.7. We'll continue to monitor, trend is improving, patient will need to be on ACE inhibitor and will need outpatient nephrology followup after discharge.  #8. Tubular adenoma on colonic polyp biopsy outpatient GI followup.  #9. Left eye 10:00 position sensation of purple colored loose body which per patient started yesterday evening. With no vision loss, no focal neurological deficit, no eye pain or headache. Have discussed it with Dr. Satira Sark patient was seen by him on 04/05/2012 and was diagnosed withProliferative diabetic retinopathy with macular edema and vitreous hemorrhage, patient will need outpatient retina specialist followup along with risk factor modification with strict control of diabetes mellitus type 2, hypertension, dyslipidemia.     DVT Prophylaxis  SCDs      Calyx Hawker,JAI M.D on 04/08/2012 at 5:08 PM  [facility] Office  [phone]

## 2012-04-08 NOTE — Progress Notes (Addendum)
Patient: [name] [name] DOB: 1966-12-10 Date of Admission: 03/27/2012 PCP - [name], MD            [facility]  MD requesting consult:  [facility] Sarajane Jews) Reason for consult: Recurrent PNA  HPI - 46yo male with hx poorly controlled DM with recent admission for PNA and strep bacteremia c/b acute renal failure. He was d/c 3/25 with PICC for Ceftriaxone through 4/2.  He returned 4/6 with cough, SOB and R sided pleuritic chest pain.  Re-admitted by [facility] with recurrent PNA, R>L pleural effusions, DM and anemia.  PCCM consulted 4/8 for assist and ?FOB.   Culture data:  4/6: BCX2>>>neg 4/11 mouth I&D>>>neg  abx Zosyn 4/6>>>4/12 vanc 4/6>>>4/12 Levaquin (PNA - per ID) 4/12>>>4/16 Zyvox (PNA - per ID) 4/12>>> Flagyl 4/16>>>   Subjective: Thoracentesis was painful.  Had CT yesterday, anxious to hear from CVTS  BP 152/84  Pulse 80  Temp(Src) 98 F (36.7 C) (Oral)  Resp 18  Ht 6\' 2"  (1.88 m)  Wt 201 lb 4.5 oz (91.3 kg)  BMI 25.84 kg/m2  SpO2 94% 2 liters  EXAM: General: pleasant male, NAD OOB in chair  HEENT: mm moist, R buccal mucosa mass improved Neuro: awake, alert, appropriate CV: s1s2 rrr PULM: resps even, nonlabored, CTA apices, diminished R>L base GI: abd soft, non tender, +bs Extremities:  Warm and dry, 1-2+ BLE edema, pale   chest X-ray Ct Chest Wo Contrast  04/08/2012  *RADIOLOGY REPORT*  Clinical Data: Follow up pleural effusion.  CT CHEST WITHOUT CONTRAST  Technique:  Multidetector CT imaging of the chest was performed following the standard protocol without IV contrast.  Comparison: Chest radiograph performed 04/06/2012, and CT of the chest performed 03/28/2012  Findings: There are persistent small to moderate bilateral pleural effusions, relatively similar in size bilaterally.  There is associated partial consolidation of the left lower lobe, and significant consolidation of the right middle and lower lobes, with associated air bronchograms.  Given the  appearance on prior CT, findings are compatible with persistent bilateral pneumonia.  The expanded portions of both lungs appear grossly clear.  There is no definite evidence for interstitial prominence to suggest pulmonary edema.  No pneumothorax is seen status post thoracentesis.  A small pericardial effusion is noted.  The mediastinum is otherwise unremarkable in appearance.  Scattered mediastinal nodes remain borderline normal in size; no mediastinal lymphadenopathy is seen. The great vessels are grossly unremarkable in appearance. The thyroid gland is unremarkable in appearance.  No axillary lymphadenopathy is seen.  The visualized portions of the liver and spleen are unremarkable.  No acute osseous abnormalities are identified.  IMPRESSION:  1.  Persistent small to moderate bilateral pleural effusions, relatively similar in size bilaterally.  Associated partial consolidation of the left lower lobe, and significant consolidation of the right middle and lower lobes, with air bronchograms. Findings compatible with persistent bilateral pneumonia. 2.  No evidence of pneumothorax; no definite evidence for pulmonary edema. 3.  Small pericardial effusion seen.  Original Report Authenticated By: Santa Lighter, M.D.   Dg Chest Port 1 View  04/06/2012  *RADIOLOGY REPORT*  Clinical Data: Status post right thoracentesis.  Question pneumothorax.  PORTABLE CHEST - 1 VIEW  Comparison: Plain film chest 04/05/2012.  Findings: No pneumothorax is identified after thoracentesis.  Right greater than left pleural effusions persist.  The patient's right effusion appears somewhat decreased.  Bilateral airspace disease appears unchanged.  Heart size normal.  IMPRESSION:  1.  Negative for pneumothorax after thoracentesis. 2.  Some decrease in right effusion.  Left effusion and bilateral airspace disease persist.  Original Report Authenticated By: Arvid Right. D'ALESSIO, M.D.    CT chest 4/6>>>Right upper/lower lobe pneumonia, with  associated moderate right pleural effusion. Patchy left lower lobe opacity, atelectasis versus pneumonia, with associated small left pleural effusion. No obvious endobronchial lesion  Bedside US exam 4/12: right chest examined at bedside. Very little pleural fluid. Mostly consolidated lung. Fluid collections are minimal and not amendable to draining.  Pictures in chart.   Lab 04/08/12 0650 04/07/12 0620 04/06/12 0655  NA 138 136 141  K 4.2 4.2 4.6  CL 105 101 105  CO2 23 26 29   BUN 19 21 22   CREATININE 1.50* 1.57* 1.66*  GLUCOSE 97 171* 71    Lab 04/08/12 0650 04/07/12 0620 04/06/12 0655  HGB 8.9* 8.6* 8.2*  HCT 27.0* 26.5* 25.2*  WBC 10.0 10.7* 7.3  PLT 476* 445* 350      IMPRESSION/ PLAN:  1. Recurrent PNA -- Recent strep pneumoniae PNA and bacteremia.  D/c home 3/25 with cont IV ceftriaxone through 4/2 per ID.  Worsened 4/6.  Persistent R sided PNA with associated effusion.  This could reflect insufficient treatment time given the severity of the PNA, possible empyema or abscess development. Does increase concern for underlying malignancy and 20 lbs wt loss unexplained! No clear endobronchial lesion or abscess on CT scan 4/6, but cont to have persistent mod effusions not amenable to thoracentesis.  F/u CT 4/16 cont to show mod amount pleural fluid and dense consolidation.  Attempted thoracentesis 4/15 with only 22ml fluid.    PLAN -  Cont IV abx (now on zyvox, flagyl per ID) Thora from 4/15 likely empyema and loculated/thick fluid CVTS following, ordered f/u CT 4/16 and will make further recs/plan after imaging.  Cont pulmonary hygiene and analgesia to assist w/ cough deep breath efforts.   2. Pleural effusions -- likely parapneumonic vs ?empyema post tap 4/16.  PLAN -  Cont abx - see above CVTS following, see above  3. Anemia - Per primary.  S/p colonoscopy 4/10, finding:  right colon polyp, which was removed.   Plan: Hold NSAIDS Regular diet Trend CBC Iron per heme/onc    4. DM - per primary , does he need gastroparesis treatment, work up? Will defer to primary.  5. Transaminitis - per primary. Trending down.   Lab 04/08/12 0650 04/07/12 0620 04/06/12 0655 04/04/12 0720 04/03/12 1120  AST 41* 26 29 69* 132*  ALT 48 48 61* 101* 141*  ALKPHOS 829* 909* 1010* 1151* 1491*  BILITOT 0.3 0.4 0.5 0.4 0.6  PROT 6.9 7.0 6.7 6.5 6.5  ALBUMIN 2.0* 2.0* 2.0* 1.8* 1.9*  INR -- -- -- -- --    6. Buccal abscess s/p I&D on 4/11.  Cultures negative  Plan Abx per ID WIll need dental/ oral surgeon f/u on d/c  [facility], NP 04/08/2012  9:42 AM Pager: [phone]  *Care during the described time interval was provided by me and/or other providers on the critical care team. I have reviewed this patient's available data, including medical history, events of note, physical examination and test results as part of my evaluation.  Christinia Gully, MD Pulmonary and [facility] [facility] [phone]

## 2012-04-08 NOTE — Progress Notes (Signed)
Patient ID: [name] [name], male   DOB: Feb 17, 1966, 46 y.o.   MRN: [id number] Feels better today BP 152/84  Pulse 80  Temp(Src) 98 F (36.7 C) (Oral)  Resp 18  Ht 6\' 2"  (1.88 m)  Wt 201 lb 4.5 oz (91.3 kg)  BMI 25.84 kg/m2  SpO2 94% CT chest IMPRESSION:  1. Persistent small to moderate bilateral pleural effusions,  relatively similar in size bilaterally. Associated partial  consolidation of the left lower lobe, and significant consolidation  of the right middle and lower lobes, with air bronchograms.  Findings compatible with persistent bilateral pneumonia.  2. No evidence of pneumothorax; no definite evidence for pulmonary  edema.  3. Small pericardial effusion seen.  My impression is that the right parapneumonic effusion is bigger and therefore I recommended to him that we proceed with Right VATS, drainage of pleural effusion in AM  I have discussed with the patient the general nature of the procedure, need for general anesthesia, and incisions to be used. I have discussed the expected hospital stay, overall recovery and short and long term outcomes. He understands the risks include but are not limited to death, stroke, MI, DVT/PE, bleeding, possible need for transfusion, infections, and other organ system dysfunction including respiratory, renal, or GI complications. He accepts these risks and agrees to proceed.  For OR tomorrow AM

## 2012-04-08 NOTE — Progress Notes (Signed)
Subjective: Pt was gone from room when I visited (in solarium perhaps)  Antibiotics:  Anti-infectives     Start     Dose/Rate Route Frequency Ordered Stop   04/07/12 1730   metroNIDAZOLE (FLAGYL) tablet 500 mg        500 mg Oral 3 times per day 04/07/12 1653     04/03/12 1400   levofloxacin (LEVAQUIN) IVPB 750 mg  Status:  Discontinued        750 mg 100 mL/hr over 90 Minutes Intravenous Every 24 hours 04/03/12 1155 04/06/12 1119   04/03/12 1200   metroNIDAZOLE (FLAGYL) IVPB 500 mg        500 mg 100 mL/hr over 60 Minutes Intravenous Every 8 hours 04/03/12 1145 04/04/12 1159   04/03/12 1200   linezolid (ZYVOX) tablet 600 mg        600 mg Oral Every 12 hours 04/03/12 1145     03/31/12 2000   piperacillin-tazobactam (ZOSYN) IVPB 3.375 g  Status:  Discontinued        3.375 g 12.5 mL/hr over 240 Minutes Intravenous Every 8 hours 03/31/12 1841 04/03/12 1145   03/28/12 1000   vancomycin (VANCOCIN) IVPB 1000 mg/200 mL premix  Status:  Discontinued        1,000 mg 200 mL/hr over 60 Minutes Intravenous Every 8 hours 03/28/12 0715 04/03/12 1008   03/28/12 1000   piperacillin-tazobactam (ZOSYN) IVPB 3.375 g  Status:  Discontinued        3.375 g 12.5 mL/hr over 240 Minutes Intravenous 3 times per day 03/28/12 0726 03/31/12 1841   03/28/12 0200   vancomycin (VANCOCIN) IVPB 1000 mg/200 mL premix        1,000 mg 200 mL/hr over 60 Minutes Intravenous  Once 03/28/12 0148 03/28/12 0413   03/28/12 0200  piperacillin-tazobactam (ZOSYN) IVPB 3.375 g       3.375 g 12.5 mL/hr over 240 Minutes Intravenous  Once 03/28/12 0148 03/28/12 0626          Medications: Scheduled Meds:    . amLODipine  5 mg Oral Daily  . atorvastatin  20 mg Oral q1800  . docusate sodium  100 mg Oral BID  . ferrous sulfate  325 mg Oral BID WC  . guaiFENesin  1,200 mg Oral BID  . insulin aspart  0-9 Units Subcutaneous TID WC  . insulin glargine  20 Units Subcutaneous QHS  . lidocaine  2 patch Transdermal Q24H  .  linezolid  600 mg Oral Q12H  . metoprolol tartrate  50 mg Oral BID  . metroNIDAZOLE  500 mg Oral Q8H  . senna  1 tablet Oral BID  . sodium chloride  3 mL Intravenous Q12H  . DISCONTD: albuterol  2.5 mg Nebulization TID   Continuous Infusions:  PRN Meds:.sodium chloride, acetaminophen, acetaminophen, albuterol, HYDROcodone-acetaminophen, morphine, ondansetron (ZOFRAN) IV, ondansetron, oxyCODONE, sodium chloride   Objective: Weight change:  No intake or output data in the 24 hours ending 04/08/12 1634 Blood pressure 152/84, pulse 80, temperature 98 F (36.7 C), temperature source Oral, resp. rate 18, height 6\' 2"  (1.88 m), weight 201 lb 4.5 oz (91.3 kg), SpO2 94.00%. Temp:  [98 F (36.7 C)-98.2 F (36.8 C)] 98 F (36.7 C) (04/17 0529) Pulse Rate:  [80-85] 80  (04/17 0551) Resp:  [18] 18  (04/17 0529) BP: (152-174)/(84-103) 152/84 mmHg (04/17 0551) SpO2:  [94 %-95 %] 94 % (04/17 0529)  Physical Exam: Pt gone from room  Lab Results:  Basename 04/08/12 0650 04/07/12 [id number]  WBC 10.0 10.7*  HGB 8.9* 8.6*  HCT 27.0* 26.5*  PLT 476* 445*    BMET  Basename 04/08/12 0650 04/07/12 0620  NA 138 136  K 4.2 4.2  CL 105 101  CO2 23 26  GLUCOSE 97 171*  BUN 19 21  CREATININE 1.50* 1.57*  CALCIUM 8.7 8.7    Micro Results: Recent Results (from the past 240 hour(s))  WOUND CULTURE     Status: Normal   Collection Time   04/02/12 12:30 PM      Component Value Range Status Comment   Specimen Description WOUND MOUTH RIGHT   Final    Special Requests ORAL MUCOSA   Final    Gram Stain     Final    Value: FEW WBC PRESENT, PREDOMINANTLY PMN     NO SQUAMOUS EPITHELIAL CELLS SEEN     NO ORGANISMS SEEN   Culture NO GROWTH 2 DAYS   Final    Report Status 04/04/2012 FINAL   Final   BODY FLUID CULTURE     Status: Normal (Preliminary result)   Collection Time   04/06/12  2:40 PM      Component Value Range Status Comment   Specimen Description FLUID PLEURAL   Final    Special Requests 1  TUBE @ 2.2CC   Final    Gram Stain     Final    Value: MODERATE WBC PRESENT, PREDOMINANTLY PMN     NO ORGANISMS SEEN   Culture NO GROWTH 1 DAY   Final    Report Status PENDING   Incomplete     Studies/Results: Ct Chest Wo Contrast  04/08/2012  *RADIOLOGY REPORT*  Clinical Data: Follow up pleural effusion.  CT CHEST WITHOUT CONTRAST  Technique:  Multidetector CT imaging of the chest was performed following the standard protocol without IV contrast.  Comparison: Chest radiograph performed 04/06/2012, and CT of the chest performed 03/28/2012  Findings: There are persistent small to moderate bilateral pleural effusions, relatively similar in size bilaterally.  There is associated partial consolidation of the left lower lobe, and significant consolidation of the right middle and lower lobes, with associated air bronchograms.  Given the appearance on prior CT, findings are compatible with persistent bilateral pneumonia.  The expanded portions of both lungs appear grossly clear.  There is no definite evidence for interstitial prominence to suggest pulmonary edema.  No pneumothorax is seen status post thoracentesis.  A small pericardial effusion is noted.  The mediastinum is otherwise unremarkable in appearance.  Scattered mediastinal nodes remain borderline normal in size; no mediastinal lymphadenopathy is seen. The great vessels are grossly unremarkable in appearance. The thyroid gland is unremarkable in appearance.  No axillary lymphadenopathy is seen.  The visualized portions of the liver and spleen are unremarkable.  No acute osseous abnormalities are identified.  IMPRESSION:  1.  Persistent small to moderate bilateral pleural effusions, relatively similar in size bilaterally.  Associated partial consolidation of the left lower lobe, and significant consolidation of the right middle and lower lobes, with air bronchograms. Findings compatible with persistent bilateral pneumonia. 2.  No evidence of  pneumothorax; no definite evidence for pulmonary edema. 3.  Small pericardial effusion seen.  Original Report Authenticated By: Santa Lighter, M.D.      Assessment/Plan: [name] [name] is a 46 y.o. male with recent pneumococcal bacteremia and pneumonia readmitted with apparent healthcare associated pneumonia and a buccal abscess.. He had elevationg in liver fxn tests in particular alk phosph ? Cause. He had  thoracocentesis yesterday with brown, yelllow fluid removed 900 cells no organisms seen. CT reviwed with radiology  1) Pulmonary infiltrates with Parapneumonic effusions: RML area with air-bronchograms is new.   --Persistent effusions which are largeer and new RML infiltrate make me think this is more a mechanical issue and that he may need bronchoscopy and or CVTS   surgeryt to help clean up airways and pleural space. No obvious reason to change abx   --continue zyvox and add flagyl   2) Buccal abscess: Dr. Hoyt Koch thinks this is due to parotid disease which would make MSSA MRSA more likely culprits. Still need to keep in mid odontogenic flora as well. He is well covered with current regimen.  --continue zyvox + flagyl --may need to reimage down the road --he also needs to be seen by a dentist and oral surgeon as oupt      LOS: 12 days   Alcide Evener 04/08/2012, 4:34 PM

## 2012-04-09 ENCOUNTER — Inpatient Hospital Stay (HOSPITAL_COMMUNITY): Payer: Managed Care, Other (non HMO)

## 2012-04-09 ENCOUNTER — Inpatient Hospital Stay (HOSPITAL_COMMUNITY): Payer: Managed Care, Other (non HMO) | Admitting: Anesthesiology

## 2012-04-09 ENCOUNTER — Encounter (HOSPITAL_COMMUNITY): Payer: Self-pay | Admitting: Anesthesiology

## 2012-04-09 ENCOUNTER — Encounter (HOSPITAL_COMMUNITY)
Admission: EM | Disposition: A | Discharge status: Home / Self Care | Payer: Self-pay | Source: Home / Self Care | Attending: Internal Medicine

## 2012-04-09 DIAGNOSIS — J869 Pyothorax without fistula: Secondary | ICD-10-CM

## 2012-04-09 LAB — COMPREHENSIVE METABOLIC PANEL
ALT: 46 U/L (ref 0–53)
AST: 31 U/L (ref 0–37)
Albumin: 2.2 g/dL — ABNORMAL LOW (ref 3.5–5.2)
Alkaline Phosphatase: 814 U/L — ABNORMAL HIGH (ref 39–117)
BUN: 20 mg/dL (ref 6–23)
CO2: 25 mEq/L (ref 19–32)
Calcium: 9 mg/dL (ref 8.4–10.5)
Chloride: 103 mEq/L (ref 96–112)
Creatinine, Ser: 1.46 mg/dL — ABNORMAL HIGH (ref 0.50–1.35)
GFR calc Af Amer: 65 mL/min — ABNORMAL LOW (ref >90)
GFR calc non Af Amer: 56 mL/min — ABNORMAL LOW (ref >90)
Glucose, Bld: 149 mg/dL — ABNORMAL HIGH (ref 70–99)
Potassium: 4 mEq/L (ref 3.5–5.1)
Sodium: 138 mEq/L (ref 135–145)
Total Bilirubin: 0.3 mg/dL (ref 0.3–1.2)
Total Protein: 7.3 g/dL (ref 6.0–8.3)

## 2012-04-09 LAB — URINALYSIS, ROUTINE W REFLEX MICROSCOPIC
Bilirubin Urine: NEGATIVE
Glucose, UA: 100 mg/dL — AB
Ketones, ur: NEGATIVE mg/dL
Leukocytes, UA: NEGATIVE
Nitrite: NEGATIVE
Protein, ur: 100 mg/dL — AB
Specific Gravity, Urine: 1.021 (ref 1.005–1.030)
Urobilinogen, UA: 0.2 mg/dL (ref 0.0–1.0)
pH: 5 (ref 5.0–8.0)

## 2012-04-09 LAB — GLUCOSE, CAPILLARY
Glucose-Capillary: 177 mg/dL — ABNORMAL HIGH (ref 70–99)
Glucose-Capillary: 154 mg/dL — ABNORMAL HIGH (ref 70–99)
Glucose-Capillary: 143 mg/dL — ABNORMAL HIGH (ref 70–99)
Glucose-Capillary: 137 mg/dL — ABNORMAL HIGH (ref 70–99)
Glucose-Capillary: 218 mg/dL — ABNORMAL HIGH (ref 70–99)

## 2012-04-09 LAB — URINE MICROSCOPIC-ADD ON

## 2012-04-09 LAB — CBC
HCT: 29 % — ABNORMAL LOW (ref 39.0–52.0)
Hemoglobin: 9.3 g/dL — ABNORMAL LOW (ref 13.0–17.0)
MCH: 28.2 pg (ref 26.0–34.0)
MCHC: 32.1 g/dL (ref 30.0–36.0)
MCV: 87.9 fL (ref 78.0–100.0)
Platelets: 529 10*3/uL — ABNORMAL HIGH (ref 150–400)
RBC: 3.3 MIL/uL — ABNORMAL LOW (ref 4.22–5.81)
RDW: 14.6 % (ref 11.5–15.5)
WBC: 9.5 10*3/uL (ref 4.0–10.5)

## 2012-04-09 LAB — PROTIME-INR
INR: 1.22 (ref 0.00–1.49)
Prothrombin Time: 15.7 seconds — ABNORMAL HIGH (ref 11.6–15.2)

## 2012-04-09 SURGERY — VIDEO ASSISTED THORACOSCOPY (VATS)/EMPYEMA
Anesthesia: General | Site: Chest | Laterality: Right | Wound class: Clean Contaminated

## 2012-04-09 MED ORDER — ACETAMINOPHEN 10 MG/ML IV SOLN
1000.0000 mg | Freq: Four times a day (QID) | INTRAVENOUS | Status: AC
  Administered 2012-04-09 – 2012-04-10 (×4): 1000 mg via INTRAVENOUS
  Filled 2012-04-09 (×4): qty 100

## 2012-04-09 MED ORDER — MORPHINE SULFATE 4 MG/ML IJ SOLN: 0.0500 mg/kg | INTRAMUSCULAR | Status: DC | PRN

## 2012-04-09 MED ORDER — ONDANSETRON HCL 4 MG/2ML IJ SOLN: 4.0000 mg | Freq: Four times a day (QID) | INTRAMUSCULAR | Status: DC | PRN

## 2012-04-09 MED ORDER — ONDANSETRON HCL 4 MG/2ML IJ SOLN
INTRAMUSCULAR | Status: DC | PRN
  Administered 2012-04-09: 4 mg via INTRAVENOUS

## 2012-04-09 MED ORDER — LACTATED RINGERS IV SOLN: INTRAVENOUS | Status: DC

## 2012-04-09 MED ORDER — HYDROMORPHONE HCL PF 1 MG/ML IJ SOLN
INTRAMUSCULAR | Status: AC
  Filled 2012-04-09: qty 1

## 2012-04-09 MED ORDER — OXYCODONE HCL 5 MG PO TABS
5.0000 mg | ORAL_TABLET | ORAL | Status: AC | PRN
  Administered 2012-04-10: 5 mg via ORAL
  Filled 2012-04-09: qty 1

## 2012-04-09 MED ORDER — LABETALOL HCL 5 MG/ML IV SOLN
INTRAVENOUS | Status: AC
  Filled 2012-04-09: qty 4

## 2012-04-09 MED ORDER — SODIUM CHLORIDE 0.9 % IJ SOLN: 9.0000 mL | INTRAMUSCULAR | Status: DC | PRN

## 2012-04-09 MED ORDER — VANCOMYCIN HCL IN DEXTROSE 1-5 GM/200ML-% IV SOLN
1000.0000 mg | INTRAVENOUS | Status: DC
  Filled 2012-04-09: qty 200

## 2012-04-09 MED ORDER — OXYCODONE-ACETAMINOPHEN 5-325 MG PO TABS
1.0000 | ORAL_TABLET | ORAL | Status: DC | PRN
  Administered 2012-04-10 – 2012-04-13 (×6): 2 via ORAL
  Filled 2012-04-09 (×6): qty 2

## 2012-04-09 MED ORDER — FENTANYL 10 MCG/ML IV SOLN
INTRAVENOUS | Status: DC
  Administered 2012-04-09 (×2): via INTRAVENOUS
  Administered 2012-04-09: 150 ug via INTRAVENOUS
  Administered 2012-04-10: 255 ug via INTRAVENOUS
  Administered 2012-04-10: 07:00:00 via INTRAVENOUS
  Administered 2012-04-11: 30 ug via INTRAVENOUS
  Filled 2012-04-09 (×4): qty 50

## 2012-04-09 MED ORDER — HYDROMORPHONE HCL PF 1 MG/ML IJ SOLN: 0.2500 mg | INTRAMUSCULAR | Status: DC | PRN

## 2012-04-09 MED ORDER — GLYCOPYRROLATE 0.2 MG/ML IJ SOLN
INTRAMUSCULAR | Status: DC | PRN
  Administered 2012-04-09: .8 mg via INTRAVENOUS

## 2012-04-09 MED ORDER — 0.9 % SODIUM CHLORIDE (POUR BTL) OPTIME
TOPICAL | Status: DC | PRN
  Administered 2012-04-09: 1000 mL

## 2012-04-09 MED ORDER — POTASSIUM CHLORIDE 10 MEQ/50ML IV SOLN
10.0000 meq | Freq: Every day | INTRAVENOUS | Status: DC | PRN
  Filled 2012-04-09: qty 50

## 2012-04-09 MED ORDER — LABETALOL HCL 5 MG/ML IV SOLN
10.0000 mg | Freq: Once | INTRAVENOUS | Status: AC
  Administered 2012-04-09: 10 mg via INTRAVENOUS

## 2012-04-09 MED ORDER — EPHEDRINE SULFATE 50 MG/ML IJ SOLN
INTRAMUSCULAR | Status: DC | PRN
  Administered 2012-04-09: 5 mg via INTRAVENOUS

## 2012-04-09 MED ORDER — NALOXONE HCL 0.4 MG/ML IJ SOLN: 0.4000 mg | INTRAMUSCULAR | Status: DC | PRN

## 2012-04-09 MED ORDER — SODIUM CHLORIDE 0.9 % IV SOLN
INTRAVENOUS | Status: DC
  Administered 2012-04-09 – 2012-04-10 (×2): via INTRAVENOUS

## 2012-04-09 MED ORDER — VANCOMYCIN HCL IN DEXTROSE 1-5 GM/200ML-% IV SOLN: 1000.0000 mg | Freq: Two times a day (BID) | INTRAVENOUS | Status: DC

## 2012-04-09 MED ORDER — PROPOFOL 10 MG/ML IV EMUL
INTRAVENOUS | Status: DC | PRN
  Administered 2012-04-09: 100 mg via INTRAVENOUS
  Administered 2012-04-09: 200 mg via INTRAVENOUS
  Administered 2012-04-09: 100 mg via INTRAVENOUS

## 2012-04-09 MED ORDER — KETOROLAC TROMETHAMINE 30 MG/ML IJ SOLN
30.0000 mg | Freq: Four times a day (QID) | INTRAMUSCULAR | Status: AC
  Administered 2012-04-09 – 2012-04-11 (×7): 30 mg via INTRAVENOUS
  Filled 2012-04-09 (×7): qty 1

## 2012-04-09 MED ORDER — DIPHENHYDRAMINE HCL 50 MG/ML IJ SOLN: 12.5000 mg | Freq: Four times a day (QID) | INTRAMUSCULAR | Status: DC | PRN

## 2012-04-09 MED ORDER — NEOSTIGMINE METHYLSULFATE 1 MG/ML IJ SOLN
INTRAMUSCULAR | Status: DC | PRN
  Administered 2012-04-09: 5 mg via INTRAVENOUS

## 2012-04-09 MED ORDER — DIPHENHYDRAMINE HCL 12.5 MG/5ML PO ELIX
12.5000 mg | ORAL_SOLUTION | Freq: Four times a day (QID) | ORAL | Status: DC | PRN
  Filled 2012-04-09: qty 5

## 2012-04-09 MED ORDER — HYDROMORPHONE HCL PF 1 MG/ML IJ SOLN
0.2500 mg | INTRAMUSCULAR | Status: DC | PRN
  Administered 2012-04-09 (×2): 0.5 mg via INTRAVENOUS

## 2012-04-09 MED ORDER — FENTANYL CITRATE 0.05 MG/ML IJ SOLN
INTRAMUSCULAR | Status: DC | PRN
  Administered 2012-04-09: 50 ug via INTRAVENOUS
  Administered 2012-04-09: 100 ug via INTRAVENOUS
  Administered 2012-04-09: 150 ug via INTRAVENOUS
  Administered 2012-04-09: 50 ug via INTRAVENOUS
  Administered 2012-04-09: 25 ug via INTRAVENOUS

## 2012-04-09 MED ORDER — TRAMADOL HCL 50 MG PO TABS
50.0000 mg | ORAL_TABLET | Freq: Four times a day (QID) | ORAL | Status: DC | PRN
  Administered 2012-04-12: 100 mg via ORAL
  Filled 2012-04-09: qty 2

## 2012-04-09 MED ORDER — MIDAZOLAM HCL 5 MG/5ML IJ SOLN
INTRAMUSCULAR | Status: DC | PRN
  Administered 2012-04-09: 2 mg via INTRAVENOUS

## 2012-04-09 MED ORDER — SENNOSIDES-DOCUSATE SODIUM 8.6-50 MG PO TABS
1.0000 | ORAL_TABLET | Freq: Every evening | ORAL | Status: DC | PRN
  Filled 2012-04-09: qty 1

## 2012-04-09 MED ORDER — ROCURONIUM BROMIDE 100 MG/10ML IV SOLN
INTRAVENOUS | Status: DC | PRN
  Administered 2012-04-09: 50 mg via INTRAVENOUS

## 2012-04-09 MED ORDER — METOPROLOL TARTRATE 50 MG PO TABS
50.0000 mg | ORAL_TABLET | Freq: Two times a day (BID) | ORAL | Status: DC
  Administered 2012-04-10 – 2012-04-13 (×8): 50 mg via ORAL
  Filled 2012-04-09 (×11): qty 1

## 2012-04-09 MED ORDER — INSULIN ASPART 100 UNIT/ML ~~LOC~~ SOLN
0.0000 [IU] | Freq: Four times a day (QID) | SUBCUTANEOUS | Status: DC
  Administered 2012-04-10: 4 [IU] via SUBCUTANEOUS
  Administered 2012-04-10: 2 [IU] via SUBCUTANEOUS

## 2012-04-09 MED ORDER — LACTATED RINGERS IV SOLN
INTRAVENOUS | Status: DC | PRN
  Administered 2012-04-09 (×2): via INTRAVENOUS

## 2012-04-09 MED ORDER — BISACODYL 5 MG PO TBEC
10.0000 mg | DELAYED_RELEASE_TABLET | Freq: Every day | ORAL | Status: DC
  Administered 2012-04-10 – 2012-04-13 (×4): 10 mg via ORAL
  Filled 2012-04-09: qty 1
  Filled 2012-04-09 (×3): qty 2

## 2012-04-09 SURGICAL SUPPLY — 58 items
CANISTER SUCTION 2500CC (MISCELLANEOUS) ×4 IMPLANT
CATH KIT ON Q 5IN SLV (PAIN MANAGEMENT) IMPLANT
CATH THORACIC 28FR (CATHETERS) IMPLANT
CATH THORACIC 36FR (CATHETERS) IMPLANT
CATH THORACIC 36FR RT ANG (CATHETERS) IMPLANT
CLIP TI MEDIUM 6 (CLIP) IMPLANT
CLOTH BEACON ORANGE TIMEOUT ST (SAFETY) ×2 IMPLANT
CONT SPEC 4OZ CLIKSEAL STRL BL (MISCELLANEOUS) ×12 IMPLANT
DRAIN CHANNEL 28F RND 3/8 FF (WOUND CARE) IMPLANT
DRAIN CHANNEL 32F RND 10.7 FF (WOUND CARE) ×4 IMPLANT
DRAPE LAPAROSCOPIC ABDOMINAL (DRAPES) ×2 IMPLANT
DRAPE SLUSH MACHINE 52X66 (DRAPES) IMPLANT
DRAPE SLUSH/WARMER DISC (DRAPES) IMPLANT
ELECT REM PT RETURN 9FT ADLT (ELECTROSURGICAL) ×2
ELECTRODE REM PT RTRN 9FT ADLT (ELECTROSURGICAL) ×1 IMPLANT
GLOVE EUDERMIC 7 POWDERFREE (GLOVE) ×4 IMPLANT
GOWN PREVENTION PLUS XLARGE (GOWN DISPOSABLE) ×4 IMPLANT
GOWN STRL NON-REIN LRG LVL3 (GOWN DISPOSABLE) ×4 IMPLANT
KIT BASIN OR (CUSTOM PROCEDURE TRAY) ×2 IMPLANT
KIT ROOM TURNOVER OR (KITS) ×2 IMPLANT
KIT SUCTION CATH 14FR (SUCTIONS) ×2 IMPLANT
NS IRRIG 1000ML POUR BTL (IV SOLUTION) ×4 IMPLANT
PACK CHEST (CUSTOM PROCEDURE TRAY) ×2 IMPLANT
PAD ARMBOARD 7.5X6 YLW CONV (MISCELLANEOUS) ×6 IMPLANT
POUCH ENDO CATCH II 15MM (MISCELLANEOUS) IMPLANT
SEALANT PROGEL (MISCELLANEOUS) IMPLANT
SEALANT SURG COSEAL 4ML (VASCULAR PRODUCTS) IMPLANT
SEALANT SURG COSEAL 8ML (VASCULAR PRODUCTS) IMPLANT
SOLUTION ANTI FOG 6CC (MISCELLANEOUS) ×4 IMPLANT
SPECIMEN JAR MEDIUM (MISCELLANEOUS) IMPLANT
SPONGE GAUZE 4X4 12PLY (GAUZE/BANDAGES/DRESSINGS) ×2 IMPLANT
SUT PROLENE 4 0 RB 1 (SUTURE)
SUT PROLENE 4-0 RB1 .5 CRCL 36 (SUTURE) IMPLANT
SUT SILK  1 MH (SUTURE) ×3
SUT SILK 1 MH (SUTURE) ×3 IMPLANT
SUT SILK 2 0SH CR/8 30 (SUTURE) IMPLANT
SUT SILK 3 0SH CR/8 30 (SUTURE) IMPLANT
SUT VIC AB 0 CTX 27 (SUTURE) IMPLANT
SUT VIC AB 1 CTX 27 (SUTURE) ×2 IMPLANT
SUT VIC AB 2-0 CT1 27 (SUTURE)
SUT VIC AB 2-0 CT1 TAPERPNT 27 (SUTURE) IMPLANT
SUT VIC AB 2-0 CTX 36 (SUTURE) ×2 IMPLANT
SUT VIC AB 3-0 MH 27 (SUTURE) IMPLANT
SUT VIC AB 3-0 SH 27 (SUTURE)
SUT VIC AB 3-0 SH 27X BRD (SUTURE) IMPLANT
SUT VIC AB 3-0 X1 27 (SUTURE) ×2 IMPLANT
SUT VICRYL 0 UR6 27IN ABS (SUTURE) ×4 IMPLANT
SUT VICRYL 2 TP 1 (SUTURE) IMPLANT
SWAB COLLECTION DEVICE MRSA (MISCELLANEOUS) IMPLANT
SYSTEM SAHARA CHEST DRAIN ATS (WOUND CARE) ×2 IMPLANT
TIP APPLICATOR SPRAY EXTEND 16 (VASCULAR PRODUCTS) IMPLANT
TOWEL OR 17X24 6PK STRL BLUE (TOWEL DISPOSABLE) ×2 IMPLANT
TOWEL OR 17X26 10 PK STRL BLUE (TOWEL DISPOSABLE) ×4 IMPLANT
TRAP SPECIMEN MUCOUS 40CC (MISCELLANEOUS) ×4 IMPLANT
TRAY FOLEY CATH 14FR (SET/KITS/TRAYS/PACK) ×2 IMPLANT
TUBE ANAEROBIC SPECIMEN COL (MISCELLANEOUS) IMPLANT
TUNNELER SHEATH ON-Q 11GX8 (MISCELLANEOUS) IMPLANT
WATER STERILE IRR 1000ML POUR (IV SOLUTION) ×4 IMPLANT

## 2012-04-09 NOTE — Brief Op Note (Addendum)
03/27/2012 - 04/09/2012  9:39 AM  PATIENT:  [name] [name]  46 y.o. male  PRE-OPERATIVE DIAGNOSIS:  RIGHT EMPYEMA  POST-OPERATIVE DIAGNOSIS:  Right Para pneumonic Effusion  PROCEDURE:  RIGHT VIDEO ASSISTED THORACOSCOPY (VATS),drainage of  pleural effusion, and decortication  SURGEON:  Surgeon(s) and Role:    * Melrose Nakayama, MD - Primary  PHYSICIAN ASSISTANT: Lars Pinks PA-C   ANESTHESIA:   general  EBL:  Total I/O In: 1000 [I.V.:1000] Out: 350 [Urine:300; Blood:50]  BLOOD ADMINISTERED:none  DRAINS: (2-32 [place]) Blake drain(s) in the [place] Drains in the right pleural space    SPECIMEN:  Source of Specimen:  Right visceral and parietal pleura,visceral peel;pleural fluid sent for culture, AFB, and fungus.  DISPOSITION OF SPECIMEN:  PATHOLOGY  COUNTS CORRECT:  YES  DICTATION: .Dragon Dictation  PLAN OF CARE: Admit to inpatient   PATIENT DISPOSITION:  PACU - hemodynamically stable.   Delay start of Pharmacological VTE agent (>24hrs) due to surgical blood loss or risk of bleeding: yes  Findings- loculated fluid, minimal pleural exudate/ peel

## 2012-04-09 NOTE — Progress Notes (Signed)
[name] [name] CSN:[id number],MRN:[id number] is a 46 y.o. male,  Outpatient Primary MD for the patient is [name], MD, MD  Chief Complaint  Patient presents with  . Emesis        Subjective:    [name] [name] seen today at PACU.  D#0 s/p VATS.  C/o mod pain in R side at area of surgery.  Want to know when he can get up.  Hungry and wishes to eat.  No N/V.  Mild SOB  Objective:   Filed Vitals:   04/09/12 1130 04/09/12 1145 04/09/12 1200 04/09/12 1215  BP: 169/91 169/87 170/99 167/98  Pulse: 73 73 73 72  Temp:      TempSrc:      Resp: 17 17 18 17   Height:      Weight:      SpO2: 95% 96% 96% 96%    Wt Readings from Last 3 Encounters:  04/04/12 91.3 kg (201 lb 4.5 oz)  04/04/12 91.3 kg (201 lb 4.5 oz)  04/04/12 91.3 kg (201 lb 4.5 oz)     Intake/Output Summary (Last 24 hours) at 04/09/12 1302 Last data filed at 04/09/12 0937  Gross per 24 hour  Intake   1700 ml  Output   1350 ml  Net    350 ml    Exam Awake Alert, Oriented *3, No new F.N deficits, Normal affect [place].AT,PERRAL. Supple Neck,No JVD, No cervical lymphadenopathy appriciated.  Symmetrical Chest wall movement, Good air movement bilaterally-Chest tube in place R side, with drainage RRR,No Gallops,Rubs or new Murmurs, No Parasternal Heave +ve B.Sounds, Abd Soft, Non tender, No organomegaly appriciated, No rebound -guarding or rigidity. No Cyanosis, Clubbing , 1+edema, No new Rash or bruise    Data Review   CBC  Lab 04/09/12 0605 04/08/12 0650 04/07/12 0620 04/06/12 0655 04/05/12 0556  WBC 9.5 10.0 10.7* 7.3 9.7  HGB 9.3* 8.9* 8.6* 8.2* 8.3*  HCT 29.0* 27.0* 26.5* 25.2* 25.5*  PLT 529* 476* 445* 350 339  MCV 87.9 85.4 88.0 88.4 88.5  MCH 28.2 28.2 28.6 28.8 28.8  MCHC 32.1 33.0 32.5 32.5 32.5  RDW 14.6 14.4 14.8 14.6 14.5  LYMPHSABS -- -- -- -- --  MONOABS -- -- -- -- --  EOSABS -- -- -- -- --  BASOSABS -- -- -- -- --  BANDABS -- -- -- -- --    Chemistries   Lab 04/09/12 0605  04/08/12 0650 04/07/12 0620 04/06/12 0655 04/05/12 0556 04/04/12 0720  NA 138 138 136 141 137 --  K 4.0 4.2 4.2 4.6 4.2 --  CL 103 105 101 105 103 --  CO2 25 23 26 29 26  --  GLUCOSE 149* 97 171* 71 104* --  BUN 20 19 21 22 23  --  CREATININE 1.46* 1.50* 1.57* 1.66* 1.69* --  CALCIUM 9.0 8.7 8.7 8.9 8.6 --  MG -- -- -- -- -- --  AST 31 41* 26 29 -- 69*  ALT 46 48 48 61* -- 101*  ALKPHOS 814* 829* 909* 1010* -- 1151*  BILITOT 0.3 0.3 0.4 0.5 -- 0.4   ------------------------------------------------------------------------------------------------------------------ estimated creatinine clearance is 74.3 ml/min (by C-G formula based on Cr of 1.46). ------------------------------------------------------------------------------------------------------------------ No results found for this basename: HGBA1C:2 in the last 72 hours ------------------------------------------------------------------------------------------------------------------ No results found for this basename: CHOL:2,HDL:2,LDLCALC:2,TRIG:2,CHOLHDL:2,LDLDIRECT:2 in the last 72 hours ------------------------------------------------------------------------------------------------------------------ No results found for this basename: TSH,T4TOTAL,FREET3,T3FREE,THYROIDAB in the last 72 hours ------------------------------------------------------------------------------------------------------------------ No results found for this basename: VITAMINB12:2,FOLATE:2,FERRITIN:2,TIBC:2,IRON:2,RETICCTPCT:2 in the last 72 hours  Coagulation profile  Lab  04/09/12 0605  INR 1.22  PROTIME --    No results found for this basename: DDIMER:2 in the last 72 hours  Cardiac Enzymes No results found for this basename: CK:3,CKMB:3,TROPONINI:3,MYOGLOBIN:3 in the last 168 hours ------------------------------------------------------------------------------------------------------------------ No components found with this basename: POCBNP:3  Micro  Results Recent Results (from the past 240 hour(s))  WOUND CULTURE     Status: Normal   Collection Time   04/02/12 12:30 PM      Component Value Range Status Comment   Specimen Description WOUND MOUTH RIGHT   Final    Special Requests ORAL MUCOSA   Final    Gram Stain     Final    Value: FEW WBC PRESENT, PREDOMINANTLY PMN     NO SQUAMOUS EPITHELIAL CELLS SEEN     NO ORGANISMS SEEN   Culture NO GROWTH 2 DAYS   Final    Report Status 04/04/2012 FINAL   Final   BODY FLUID CULTURE     Status: Normal (Preliminary result)   Collection Time   04/06/12  2:40 PM      Component Value Range Status Comment   Specimen Description FLUID PLEURAL   Final    Special Requests 1 TUBE @ 2.2CC   Final    Gram Stain     Final    Value: MODERATE WBC PRESENT, PREDOMINANTLY PMN     NO ORGANISMS SEEN   Culture NO GROWTH 2 DAYS   Final    Report Status PENDING   Incomplete     Radiology Reports Dg Chest 2 View  03/28/2012  *RADIOLOGY REPORT*  Clinical Data: Cough, right rib pain, emesis, recent pneumonia  CHEST - 2 VIEW  Comparison: 03/16/2012  Findings: Patchy right lower lobe opacity, suspicious for pneumonia, with associated small-to-moderate right pleural effusion.  Left basilar opacity, atelectasis versus pneumonia, with suspected small left pleural effusion.  No pneumothorax.  The heart is normal in size.  Interval removal of right subclavian PICC.  Visualized osseous structures are within normal limits.  IMPRESSION: Patchy right lower lobe opacity, suspicious for pneumonia, with associated small-to-moderate right pleural effusion.  Left basilar opacity, atelectasis versus pneumonia, with suspected small left pleural effusion.  Original Report Authenticated By: Julian Hy, M.D.   Dg Chest 2 View  03/16/2012  *RADIOLOGY REPORT*  Clinical Data: History of pneumonia.  CHEST - 2 VIEW  Comparison: 03/11/2012.  Findings: Tip of right PICC terminates in right atrial region.  No pneumothorax is seen.  There is  some atelectasis and infiltrate in the medial posterior aspect of the left lung.  There are atelectasis and infiltrative densities seen within the right perihilar region and within the right midlung and right lower lung areas with consolidation and atelectasis.  There is improvement in the amount of infiltrate compared to previous study.   On the lateral image there is continued abnormal opacity inferiorly and posteriorly.  Indistinctness of costophrenic angles may reflect an element of pleural effusion.  IMPRESSION: Right PICC in place.  Some atelectasis and infiltrative density in the medial posterior left lung inferiorly. here are atelectasis and infiltrative densities seen within the right perihilar region and within the right midlung and right lower lung areas with consolidation and atelectasis.  There is improvement in the amount of infiltrative compared to previous study but clearing has not occurred. Question small amounts of pleural effusion.  Original Report Authenticated By: Delane Ginger, M.D.   Dg Chest 2 View  03/11/2012  *RADIOLOGY REPORT*  Clinical Data: Shortness of  breath with right lower chest pain and fever.  CHEST - 2 VIEW  Comparison: None.  Findings: Trachea is midline.  Heart size normal.  There is dense airspace consolidation in the right lower lobe.  Question mild left lower lobe air space disease.  No left pleural fluid.  IMPRESSION:  1.  Dense airspace consolidation in the right lower lobe is most consistent with pneumonia.  Follow-up to clearing is recommended, to exclude a centrally obstructing mass. 2.  Question mild left lower lobe air space disease.  Original Report Authenticated By: Luretha Rued, M.D.   Ct Chest Wo Contrast  03/28/2012  *RADIOLOGY REPORT*  Clinical Data: Cough, right-sided chest pain  CT CHEST WITHOUT CONTRAST  Technique:  Multidetector CT imaging of the chest was performed following the standard protocol without IV contrast.  Comparison: Chest radiographs  dated 03/28/2012.  CT chest dated 03/11/2012.  Findings: Patchy posterior right upper lobe opacity and near complete right lower lobe consolidation, suspicious for pneumonia. Associated moderate right pleural effusion.  Patchy left lower lobe opacity, atelectasis versus pneumonia, with associated small left pleural effusion.  No pneumothorax.  Heart is normal in size.  Small pericardial effusion.  Visualized upper abdomen is unremarkable.  Mild degenerative changes of the visualized thoracolumbar spine.  IMPRESSION: Right upper/lower lobe pneumonia, with associated moderate right pleural effusion.  Patchy left lower lobe opacity, atelectasis versus pneumonia, with associated small left pleural effusion.  Original Report Authenticated By: Julian Hy, M.D.   Ct Angio Chest W/cm &/or Wo Cm  03/11/2012  *RADIOLOGY REPORT*  Clinical Data: Fever.  Cough.  Weakness.  Short of breath.  CT ANGIOGRAPHY CHEST  Technique:  Multidetector CT imaging of the chest using the standard protocol during bolus administration of intravenous contrast. Multiplanar reconstructed images including MIPs were obtained and reviewed to evaluate the vascular anatomy.  Contrast: 88mL OMNIPAQUE IOHEXOL 350 MG/ML IV SOLN  Comparison: Chest radiography same day  Findings: Pulmonary arterial opacification is excellent.  There are no pulmonary emboli.  There is consolidative pneumonia throughout the right lower lobe with minor involvement of the right upper lobe.  There is consolidative pneumonia less extensively in the posterior aspect of the left lower lobe.  There is a small amount of pleural fluid on the right.  There is a tiny amount of pericardial fluid.  There are small mediastinal lymph nodes, likely reactive.  No upper abdominal pathology is seen.  IMPRESSION: No pulmonary emboli.  Consolidative pneumonia throughout the right lower lobe.  Partial involvement of the left lower lobe by consolidative  pneumonia. Minimal involvement of the  right upper lobe.  Original Report Authenticated By: Jules Schick, M.D.   Nm Hepatobiliary  03/14/2012  *RADIOLOGY REPORT*  Clinical Data: Abdominal pain, evaluate for cholecystitis  NUCLEAR MEDICINE HEPATOHBILIARY INCLUDE GB  Radiopharmaceutical:  5.5 mCi technetium 28m Choletec  Comparison: Ultrasound dated 03/12/2012.  Findings: Normal hepatic uptake.  Small bowel is visible within 20 minutes.  Gallbladder is visible within 30 minutes, confirming cystic duct patency.  IMPRESSION: Normal hepatobiliary nuclear medicine scan.  Original Report Authenticated By: Julian Hy, M.D.   [place] Chest  03/28/2012  *RADIOLOGY REPORT*  Clinical Data: 46 year old male with pneumonia and pleural effusions on chest CT.  Assess for thoracentesis.  CHEST ULTRASOUND  Comparison: Chest CT without contrast 03/28/2012 and earlier.  Findings: Gray-scale imaging of the right hemithorax demonstrates a small component of pleural fluid, with a larger adjacent component of consolidated lung.  No free fluid seen subjacent to  the right hemidiaphragm.  The quantity of pleural fluid identified is insufficient for thoracentesis.  IMPRESSION: Right pleural effusion and larger component of right lung consolidation on ultrasound.  Pleural fluid component quantity insufficient for thoracentesis at this time.  Original Report Authenticated By: Randall An, M.D.   US Abdomen Complete  03/28/2012  *RADIOLOGY REPORT*  Clinical Data:  Elevated LFTs.  Right upper quadrant pain and nausea.  ABDOMINAL ULTRASOUND COMPLETE  Comparison:  03/12/2012  Findings:  Gallbladder:  No gallstones, gallbladder wall thickening, or pericholecystic fluid. Negative sonographic Murphy's sign reported by the sonographer.  Common Bile Duct:  Within normal limits in caliber.  Liver: No focal mass lesion identified.  Within normal limits in parenchymal echogenicity.  IVC:  Appears normal.  Pancreas:  No abnormality identified.  Spleen:  Within normal limits in size  and echotexture.  Right kidney:  Normal in size and parenchymal echogenicity.  No evidence of mass or hydronephrosis.  Left kidney:  Normal in size and parenchymal echogenicity.  No evidence of mass or hydronephrosis.  Abdominal Aorta:  No aneurysm identified.  Other findings:  Small bilateral pleural effusions.  Small amount of upper abdominal ascites posterior to the liver and spleen.  IMPRESSION:  1.  Small amount of abdominal ascites. 2.  Small pleural effusions.  Original Report Authenticated By: Angelita Ingles, M.D.   US Abdomen Complete  03/12/2012  *RADIOLOGY REPORT*  Clinical Data:  Right upper quadrant abdominal pain, elevated liver function tests  ABDOMINAL ULTRASOUND COMPLETE  Comparison:  Chest CT 03/11/2012 with incomplete visualization of the liver  Findings:  Gallbladder:  Minimal dependent sludge is noted within the gallbladder.  The gallbladder is not distended, with gallbladder wall thickness measuring borderline thickened at 4 mm.  No shadowing calculus or sonographic Murphy's sign noted.  Common Bile Duct:  Within normal limits in caliber.  Liver: No focal mass lesion identified.  Within normal limits in parenchymal echogenicity.  IVC:  Appears normal.  Pancreas:  No abnormality identified.  Spleen:  Within normal limits in size and echotexture.  Right kidney:  Normal in size and parenchymal echogenicity.  No evidence of mass or hydronephrosis.  Left kidney:  Normal in size and parenchymal echogenicity.  No evidence of mass or hydronephrosis.  Abdominal Aorta:  Small amount of ascites is noted.  Trace left pleural effusion.  IMPRESSION: Minimal sludge within the gallbladder with borderline wall thickening.  This could indicate cholecystitis in the appropriate clinical context.  Trace ascites.  Original Report Authenticated By: Arline Asp, M.D.   Ct Maxillofacial W/cm  04/01/2012  *RADIOLOGY REPORT*  Clinical Data: Swelling on the right for several weeks.  Jaw pain.  CT  MAXILLOFACIAL WITH CONTRAST  Technique:  Multidetector CT imaging of the maxillofacial structures was performed with intravenous contrast. Multiplanar CT image reconstructions were also generated.  Contrast: 17mL OMNIPAQUE IOHEXOL 300 MG/ML  SOLN  Comparison: None.  Findings: In the right buccal region, there is a complex fluid collection spanning over 5 x 2.1 x 1.1 cm.  This is immediately adjacent to the significant dental disease.  Mild cervical spondylotic changes.  Visualized mastoid air cells, middle ear cavities and paranasal sinuses are clear.  IMPRESSION: Probable abscess right buccal region most likely dental in origin given the degree of significant caries and periapical lucency of the molars.  Results called to Bari Mantis patient's nurse 04/01/2012 3:20 a.m. with request to call report first in the morning.  Original Report Authenticated By: Doug Sou,  M.D.   Dg Chest Port 1 View  03/31/2012  *RADIOLOGY REPORT*  Clinical Data: Assess infiltrate.  PORTABLE CHEST - 1 VIEW  Comparison: 03/28/2012  Findings: Very low lung volumes.  Worsening bibasilar opacities and probable edema.  Bilateral effusions.  Cardiomegaly.  IMPRESSION: Increasing pulmonary edema, bibasilar opacities and effusions. Very low lung volumes.  Original Report Authenticated By: Raelyn Number, M.D.    Echo  - Left ventricle: The cavity size was normal. Wall thickness was increased in a pattern of mild LVH. Systolic function was normal. The estimated ejection fraction was in the range of 55% to 60%. - Atrial septum: No defect or patent foramen ovale was identified. - Pericardium, extracardiac: Small pericardial effusion with no evidence of tamponade  Scheduled Meds:    . amLODipine  5 mg Oral Daily  . atorvastatin  20 mg Oral q1800  . docusate sodium  100 mg Oral BID  . fentaNYL   Intravenous Q4H  . ferrous sulfate  325 mg Oral BID WC  . guaiFENesin  1,200 mg Oral BID  . HYDROmorphone      . insulin aspart   0-9 Units Subcutaneous TID WC  . insulin glargine  20 Units Subcutaneous QHS  . ketorolac  30 mg Intravenous Q6H  . lidocaine  2 patch Transdermal Q24H  . linezolid  600 mg Oral Q12H  . metoprolol tartrate  50 mg Oral BID  . metroNIDAZOLE  500 mg Oral Q8H  . senna  1 tablet Oral BID  . sodium chloride  3 mL Intravenous Q12H   Continuous Infusions:   PRN Meds:.sodium chloride, acetaminophen, acetaminophen, albuterol, diphenhydrAMINE, diphenhydrAMINE, HYDROcodone-acetaminophen, HYDROmorphone, morphine, morphine, naloxone, ondansetron (ZOFRAN) IV, ondansetron, oxyCODONE, sodium chloride, sodium chloride, zolpidem, DISCONTD: 0.9 % irrigation (POUR BTL), DISCONTD: ondansetron (ZOFRAN) IV  Assessment & Plan   Brief narrative:  46 year old man recently treated for streptococcus pneumoniae bacteremia and pneumonia , anemia, right-sided facial swelling and elevated liver enzymes along with acute renal failure during his first hospitalization about 2 weeks ago was admitted on 03/12/2012 and discharged on 03/17/2012, patient had received a PICC line during his first admission and was then discharged on IV Rocephin to home as recommended by ID physician Dr. Linus Salmons.  He presented back to the hospital 03/28/2010 with fever and chills, this time he was diagnosed with buccal abscess, along with bilateral pleural effusions with possible right-sided parapneumonic effusion, persistently elevated liver enzymes despite negative hepatitis panel , mild gallbladder sludge on ultrasound but with negative HIDA, he was being seen by GI, pulmonary, ID, renal, hematology.  CT scan of the abdomen and pelvis which was done on April 14 by hematology short moderate to large sized bilateral pleural effusions, patient did have right-sided pneumonia and right-sided pleuritic chest pain throughout his hospital stay so pulmonary was the cord on the 15th, I do ultrasound-guidance thoracentesis was attempted on the 15th by fibrofatty  which he is only 5 cc of fluid brown in color, showing large amount of WBCs and moderate neutrophils, further studies are pending, will wait for pulmonary and ID followup in the future course of action i.e. antibiotics/chest tube depending on that recommendation. I have also discussed with the patient that it is a [facility] he continues to require IV antibiotics +/- chest tube placement if deemed necessary by pulmonary.  His antibiotics have been switched around by infectious disease physicians, he continues to have moderate to severe anemia probably anemia of chronic  disease along with anemia of acute disease in combination with iron deficiency worsened by frequent lab draws, his iron has been replaced intravenously and now is on oral supplements, has been seen by hematology and is being followed up by them, GI has ordered further immunological testing for elevated transaminases, pulmonary has evaluated him several times for his right-sided parapneumonic effusion however effusion size is too small to be tapped who on ultrasound, He has redeveloped mild non-oliguric acute renal insufficiency again with negative ultrasound, this is likely due to vancomycin and anemia, vancomycin has been stopped patient is improving post gentle IV fluids. His edema has improved thereafter Lasix has been stopped also. His creatinine is starting to improve gently.  He has elevated LFTs with positive ANA, right upper quadrant ultrasound x2 along with high-dose scan has been negative no clinical signs of acute cholecystitis, patient is being followed by Dr.Mann and he will need to follow with GI upon discharge in about 3 weeks for followup on Serological tests ordered by Dr. Collene Mares, and colonoscopy and EGD biopsy results.  He also had left I visual field defect i.e. loose body in his left eye for which he was seen by ophthalmologist Dr.Tanner who diagnosed him with Proliferative  diabetic retinopathy with macular edema and vitreous hemorrhage and will need continued outpatient IV followup along with aggressive risk factor modification i.e. Diabetes, hypertension dyslipidemia.    Past medical history:  Diabetes mellitus    Consultations done during hospitalization: And followups needed post discharge  Pulmonology -Dr. Lamonte Sakai Gastroenterology -Dr. Collene Mares ENT (no followup needed) Oral Surgery - Dr. Hoyt Koch Hematology - Dr Jamse Arn Tanner-Opthalmology - will need outpatient Retina specialist to followup CTS called for VATS 04/07/2012 PT    Procedures:  April 9: Upper endoscopy: LA grade C esophagitis. Protonix 40 mg by mouth twice a day- Dr Benson [place] April 10: Colonoscopy - Dr Collene Mares April 11: I&D of Rt Parotid Abscess - Dr Hoyt Koch April 18: VATs by Dr. Roxan Hockey   Antibiotics:  April 6: Vancomycin  April 6: Zosyn ABX on April 12th switched to Zyvox-Levaquin-Flagyl      #1. 46 y.o. male admitted on 03/27/2012, with recurrent pneumonia along with pleural effusion concerning for empyema, recent admission for Streptococcus pneumoniae bacteremia and was discharged on IV Rocephin, now comes back with fever chills. ID now changed ABX to Zyvox-Linezolide and Falgyl due to ? Marrow suppression and ARF on 12th, D/W Dr Wendie Agreste, will monitor. Patient continues to have quite a bit of oxygen demand along with exertional shortness of breath, off note CT abdomen done on 04/05/2012 by Haem-Onc shows significantly large amounts of bilateral pleural effusions,, a thoracentesis was attempted on 04/06/2012 which he did 5-10 cc of brown colored fluid, with large amount of WBCs and neutrophilsPatient had VATs done today and will be admitted to step-down for overnight monitoring.  Pain management and post-o[p orders per surgeon.  Hopefully can determine organism now with cultures and narrow to appropriate meds.  Post-Op CXR showed decrease in R effusions pleural effusions  #2.Mixed pattern  elevated LFTs: Etiology not clear. Abdominal ultrasound unremarkable on the 6th, hepatitis panel negative and recent HIDA scan was negative. ?? Possibly related to effusion. Has bumped up again on 12th, GI following patient, repeat US shows evidence of gallbladder sludge. Surgery thought no surgical intervention given negative ultrasound, with recent negative HIDA scan, echogram shows mild LV hypertrophy but no frank acute heart failure-trending downwards  #3. Hyponatremia: Stable. Likely related to current pneumonia/pulmonary process. Resolved   #  4. Anemia: Likely combination of anemia of chronic disease, iron deficiency, anemia acute disease. No history of bleeding. Fecal occult blood negative. No previous data prior to last hospitalization when he presented with hemoglobin of 10 which quickly decreased, probably from dilution also could be a medication effect. His anemia panel does show severely low iron although ferritin is stable (however it is a acute phase reactant) . Appreciate gastroenterology evaluation, EGD showing grade C. esophagitis placed on PPI, limited colonoscopy also unremarkable, stable post 1 unit on 11th, ? Marrow suppression from Zosyn + AOAD  and iron deficiency, IV iron being given on recommendation of hematology we'll continue to monitor H&H which is improving, appreciate hematology input.  #5. Diabetes mellitus type 2, uncontrolled, A1c 15: Continue sliding scale insulin, Lantus, meal coverage, due to acute renal insufficiency patient's insulin dose has been reduced on 04/05/2012, few episodes of borderline hypoglycemia were noted-blood sugars between 100-105  CBG (last 3)   Basename 04/09/12 1004 04/09/12 0532 04/08/12 2056  GLUCAP 143* 154* 177*   #6. Right buccal mucosa mass: This developed during his last hospitalization. Was seen by ENT and oral surgery, underwent I&D of a right parotid abscess by Dr. Hoyt Koch on 04/02/2012, antibiotics as #1 above.  #7. Acute renal  failure with edema - renal failure improved slowly, could have been due to combination of vancomycin and anemia, [place] stable, FeNa 1.37, urine does show mild proteinuria and since patient is diabetic we'll try to place him on ACE. but once creatinine is stable will need outpatient renal follow up. Continue to hold lasix, post gentle IVF, vancomycin stopped 03-03-12.  discussed the case on 04/05/2012 with nephrologist most likely this ATN which was improved in due course of time. Patient likely has underlying CKD due to his diabetes which was an extremely poor control as evidenced by his A1c of 15.7. We'll continue to monitor, trend is improving, patient will need to be on ACE inhibitor and will need outpatient nephrology followup after discharge.  #8. Tubular adenoma on colonic polyp biopsy outpatient GI followup.  #9. Left eye 10:00 position sensation of purple colored loose body which per patient started yesterday evening. With no vision loss, no focal neurological deficit, no eye pain or headache. Have discussed it with Dr. Satira Sark patient was seen by him on 04/05/2012 and was diagnosed withProliferative diabetic retinopathy with macular edema and vitreous hemorrhage, patient will need outpatient retina specialist followup along with risk factor modification with strict control of diabetes mellitus type 2, hypertension, dyslipidemia.     DVT Prophylaxis  SCDs      [name] [name],JAI M.D on 04/09/2012 at 1:02 PM  [facility] Office  [phone]

## 2012-04-09 NOTE — Interval H&P Note (Signed)
History and Physical Interval Note:  04/09/2012 7:37 AM  [name] [name]  has presented today for surgery, with the diagnosis of EMPYEMA  The various methods of treatment have been discussed with the patient and family. After consideration of risks, benefits and other options for treatment, the patient has consented to  Procedure(s) (LRB): VIDEO ASSISTED THORACOSCOPY (VATS)/EMPYEMA (Right) as a surgical intervention .  The patients' history has been reviewed, patient examined, no change in status, stable for surgery.  I have reviewed the patients' chart and labs.  Questions were answered to the patient's satisfaction.     Lateefah Mallery C

## 2012-04-09 NOTE — Transfer of Care (Signed)
Immediate Anesthesia Transfer of Care Note  Patient: [name] [name]  Procedure(s) Performed: Procedure(s) (LRB): VIDEO ASSISTED THORACOSCOPY (VATS)/EMPYEMA (Right)  Patient Location: PACU  Anesthesia Type: General  Level of Consciousness: awake, alert  and oriented  Airway & Oxygen Therapy: Patient Spontanous Breathing and Patient connected to face mask oxygen  Post-op Assessment: Report given to PACU RN and Post -op Vital signs reviewed and stable  Post vital signs: Reviewed and stable  Complications: No apparent anesthesia complications

## 2012-04-09 NOTE — Progress Notes (Signed)
Nutrition Follow-up  Diet Order:  NPO  Pt s/p RIGHT VIDEO ASSISTED THORACOSCOPY (VATS),drainage of  pleural effusion, and decortication due to right empyema/persistent small to moderate bilateral pleural effusions.  Pt previously on CHO Modified diet with 100% meal completion.  Meds: Scheduled Meds:   . amLODipine  5 mg Oral Daily  . atorvastatin  20 mg Oral q1800  . docusate sodium  100 mg Oral BID  . fentaNYL   Intravenous Q4H  . ferrous sulfate  325 mg Oral BID WC  . guaiFENesin  1,200 mg Oral BID  . HYDROmorphone      . insulin aspart  0-9 Units Subcutaneous TID WC  . insulin glargine  20 Units Subcutaneous QHS  . ketorolac  30 mg Intravenous Q6H  . lidocaine  2 patch Transdermal Q24H  . linezolid  600 mg Oral Q12H  . metoprolol tartrate  50 mg Oral BID  . metroNIDAZOLE  500 mg Oral Q8H  . senna  1 tablet Oral BID  . sodium chloride  3 mL Intravenous Q12H   Continuous Infusions:  PRN Meds:.sodium chloride, acetaminophen, acetaminophen, albuterol, diphenhydrAMINE, diphenhydrAMINE, HYDROcodone-acetaminophen, HYDROmorphone, morphine, morphine, naloxone, ondansetron (ZOFRAN) IV, ondansetron, oxyCODONE, sodium chloride, sodium chloride, zolpidem, DISCONTD: 0.9 % irrigation (POUR BTL), DISCONTD: ondansetron (ZOFRAN) IV  Labs:  CMP     Component Value Date/Time   NA 138 04/09/2012 0605   K 4.0 04/09/2012 0605   CL 103 04/09/2012 0605   CO2 25 04/09/2012 0605   GLUCOSE 149* 04/09/2012 0605   BUN 20 04/09/2012 0605   CREATININE 1.46* 04/09/2012 0605   CALCIUM 9.0 04/09/2012 0605   PROT 7.3 04/09/2012 0605   ALBUMIN 2.2* 04/09/2012 0605   AST 31 04/09/2012 0605   ALT 46 04/09/2012 0605   ALKPHOS 814* 04/09/2012 0605   BILITOT 0.3 04/09/2012 0605   GFRNONAA 56* 04/09/2012 0605   GFRAA 65* 04/09/2012 0605  CBG (last 3)   Basename 04/09/12 1004 04/09/12 0532 04/08/12 2056  GLUCAP 143* 154* 177*    Intake/Output Summary (Last 24 hours) at 04/09/12 1126 Last data filed at 04/09/12 [id number]  Gross per 24 hour  Intake   1700 ml  Output   1350 ml  Net    350 ml    Weight Status:   201 lbs 4/13 198 lbs 4/12  Estimated needs:  Kcal: 2100-2300 Protein: 85-100 gm  Nutrition Dx:  Food and nutrition related knowledge deficit (NB-1.1). Status: Ongoing  Goal:  PO intake will be >75% most meals; met  Intervention:    Continued CHO counting education, defer education today due to surgery today.  Recommend CHO modified high diet to better meet pt's needs.  Monitor:  PO intake, weight, labs, I/O's, education needs  Maylon Peppers Cornelison Pager #:  [phone]

## 2012-04-09 NOTE — Anesthesia Procedure Notes (Addendum)
Procedure Name: Intubation Date/Time: 04/09/2012 8:21 AM Performed by: Angelique Holm Pre-anesthesia Checklist: Patient identified, Timeout performed, Emergency Drugs available, Suction available and Patient being monitored Patient Re-evaluated:Patient Re-evaluated prior to inductionOxygen Delivery Method: Circle system utilized Preoxygenation: Pre-oxygenation with 100% oxygen Intubation Type: IV induction and Cricoid Pressure applied Ventilation: Mask ventilation without difficulty and Oral airway inserted - appropriate to patient size Laryngoscope Size: Mac, 2, Miller and 4 Grade View: Grade IV Tube type: Oral Tube size: 8.0 mm Number of attempts: 3 Airway Equipment and Method: Stylet and Video-laryngoscopy Placement Confirmation: positive ETCO2,  ETT inserted through vocal cords under direct vision and breath sounds checked- equal and bilateral Tube secured with: Tape Dental Injury: Teeth and Oropharynx as per pre-operative assessment  Comments: DLx1 by CRNA with MAC 4 - grade 4 view.  DLx2 by anesthesiologist with Sabra Heck 2 8.0 ETT inserted atraumatically.  8.0 ETT exchanged for a double lumen tube with glidescope and cook catheter. Pt. Had immobile epiglottis.

## 2012-04-09 NOTE — Anesthesia Preprocedure Evaluation (Addendum)
Anesthesia Evaluation  Patient identified by MRN, date of birth, ID band Patient awake    Reviewed: Allergy & Precautions, H&P , NPO status , Patient's Chart, lab work & pertinent test results, reviewed documented beta blocker date and time   Airway Mallampati: II TM Distance: >3 FB Neck ROM: Full    Dental  (+) Teeth Intact and Dental Advisory Given   Pulmonary pneumonia ,  Empyema right  breath sounds clear to auscultation R>L diminished at bases  + decreased breath sounds      Cardiovascular Rhythm:Regular Rate:Normal  EF 50-55%    Neuro/Psych negative neurological ROS  negative psych ROS   GI/Hepatic negative GI ROS, Neg liver ROS,   Endo/Other  Diabetes mellitus-, Poorly Controlled, Type 1, Insulin Dependent  Renal/GU Cr 1.5, acute renal failure with past PNA admission  negative genitourinary   Musculoskeletal negative musculoskeletal ROS (+)   Abdominal Normal abdominal exam  (+)   Peds  Hematology negative hematology ROS (+)   Anesthesia Other Findings   Reproductive/Obstetrics                        Anesthesia Physical Anesthesia Plan  ASA: III  Anesthesia Plan: General   Post-op Pain Management:    Induction: Intravenous  Airway Management Planned: Double Lumen EBT  Additional Equipment: Arterial line  Intra-op Plan:   Post-operative Plan: Extubation in OR and Possible Post-op intubation/ventilation  Informed Consent: I have reviewed the patients History and Physical, chart, labs and discussed the procedure including the risks, benefits and alternatives for the proposed anesthesia with the patient or authorized representative who has indicated his/her understanding and acceptance.   Dental advisory given  Plan Discussed with: Anesthesiologist and Surgeon  Anesthesia Plan Comments:         Anesthesia Quick Evaluation

## 2012-04-09 NOTE — H&P (Signed)
Reason for Consult:Right parapneumonic effusion  Referring Physician: Dr. Jettie Pagan is an 46 y.o. male.  HPI: 46 yo male admitted 4/6 with cc/o pleuritic CP, SOB and nausea  [name] [name] is a 46 year old nonsmoker who was treated for pneumonia in March of this year. He has streptococcal pneumonia complicated by acute renal failure. He was discharged on 3/25 with home IV antibiotics(ceftriaxone). He felt well until 4/4 when he developed pleuritic right sided CP. Over the next couple of days it worsened, he then began having a lot of nausea. He was readmitted 4/6 with pneumonia and a parapneumonic effusion. He states that the CP was a 10/10 on admission. He was treated with IV antibiotics. A chest CT showed a densely consolidated RLL with a small to moderate effusion. He has improved clinically since admission. Still some pleuritic pain 4/10, but overall feels much better. A followup CXR yesterday showed possible increase in pleural fluid. Attempted thoracentesis yielded only 3 cc of fluid.  Past Medical History   Diagnosis  Date   .  Diabetes mellitus    .  Pneumonia  02/2012     Strep pneumoniae bilateral pneumonia complicated by bacteremia   .  Bacteremia     Past Surgical History   Procedure  Date   .  Esophagogastroduodenoscopy  03/31/2012     Procedure: ESOPHAGOGASTRODUODENOSCOPY (EGD); Surgeon: Beryle Beams, MD; Location: [facility] ENDOSCOPY; Service: Endoscopy; Laterality: N/A;   .  Colonoscopy  04/01/2012     Procedure: COLONOSCOPY; Surgeon: Juanita Craver, MD; Location: [facility] ENDOSCOPY; Service: Endoscopy; Laterality: N/A;    History reviewed. No pertinent family history.  Social History: reports that he has never smoked. He has never used smokeless tobacco. He reports that he does not drink alcohol or use illicit drugs.  Allergies:  Allergies   Allergen  Reactions   .  Penicillins  Rash     Tolerating Ceftriaxone (03/12/12)    Medications:  I have reviewed the patient's  current medications.  Prior to Admission:  Prescriptions prior to admission   Medication  Sig  Dispense  Refill   .  aspirin 81 MG tablet  Take 81 mg by mouth every morning.     .  insulin aspart (NOVOLOG) 100 UNIT/ML injection  Inject 11 Units into the skin 3 (three) times daily before meals. Based on sliding scale. Pt took 16 units today at 1600.     Marland Kitchen  insulin glargine (LANTUS) 100 UNIT/ML injection  Inject 40 Units into the skin at bedtime.     Marland Kitchen  lisinopril (PRINIVIL,ZESTRIL) 5 MG tablet  Take 5 mg by mouth every morning.     .  Multiple Vitamin (MULITIVITAMIN WITH MINERALS) TABS  Take 1 tablet by mouth every morning.     .  rosuvastatin (CRESTOR) 10 MG tablet  Take 10 mg by mouth every morning.      Results for orders placed during the hospital encounter of 03/27/12 (from the past 48 hour(s))   GLUCOSE, CAPILLARY Status: Abnormal    Collection Time    04/05/12 8:30 PM   Component  Value  Range  Comment    Glucose-Capillary  100 (*)  70 - 99 (mg/dL)     Comment 1  Notify RN     GLUCOSE, CAPILLARY Status: Abnormal    Collection Time    04/05/12 10:22 PM   Component  Value  Range  Comment    Glucose-Capillary  146 (*)  70 - 99 (  mg/dL)     Comment 1  Notify RN     CBC Status: Abnormal    Collection Time    04/06/12 6:55 AM   Component  Value  Range  Comment    WBC  7.3  4.0 - 10.5 (K/uL)     RBC  2.85 (*)  4.22 - 5.81 (MIL/uL)     Hemoglobin  8.2 (*)  13.0 - 17.0 (g/dL)     HCT  25.2 (*)  39.0 - 52.0 (%)     MCV  88.4  78.0 - 100.0 (fL)     MCH  28.8  26.0 - 34.0 (pg)     MCHC  32.5  30.0 - 36.0 (g/dL)     RDW  14.6  11.5 - 15.5 (%)     Platelets  350  150 - 400 (K/uL)    COMPREHENSIVE METABOLIC PANEL Status: Abnormal    Collection Time    04/06/12 6:55 AM   Component  Value  Range  Comment    Sodium  141  135 - 145 (mEq/L)     Potassium  4.6  3.5 - 5.1 (mEq/L)     Chloride  105  96 - 112 (mEq/L)     CO2  29  19 - 32 (mEq/L)     Glucose, Bld  71  70 - 99 (mg/dL)     BUN   22  6 - 23 (mg/dL)     Creatinine, Ser  1.66 (*)  0.50 - 1.35 (mg/dL)     Calcium  8.9  8.4 - 10.5 (mg/dL)     Total Protein  6.7  6.0 - 8.3 (g/dL)     Albumin  2.0 (*)  3.5 - 5.2 (g/dL)     AST  29  0 - 37 (U/L)     ALT  61 (*)  0 - 53 (U/L)     Alkaline Phosphatase  1010 (*)  39 - 117 (U/L)     Total Bilirubin  0.5  0.3 - 1.2 (mg/dL)     GFR calc non Af Amer  48 (*)  >90 (mL/min)     GFR calc Af Amer  56 (*)  >90 (mL/min)    GLUCOSE, CAPILLARY Status: Normal    Collection Time    04/06/12 7:01 AM   Component  Value  Range  Comment    Glucose-Capillary  70  70 - 99 (mg/dL)     Comment 1  Notify RN     GLUCOSE, CAPILLARY Status: Abnormal    Collection Time    04/06/12 12:04 PM   Component  Value  Range  Comment    Glucose-Capillary  133 (*)  70 - 99 (mg/dL)    BODY FLUID CULTURE Status: Normal (Preliminary result)    Collection Time    04/06/12 2:40 PM   Component  Value  Range  Comment    Specimen Description  FLUID PLEURAL      Special Requests  1 TUBE @ 2.2CC      Gram Stain       Value:  MODERATE WBC PRESENT, PREDOMINANTLY PMN     NO ORGANISMS SEEN    Culture  NO GROWTH      Report Status  PENDING     BODY FLUID CELL COUNT WITH DIFFERENTIAL Status: Abnormal    Collection Time    04/06/12 2:40 PM   Component  Value  Range  Comment    Fluid Type-FCT  FLUID  Color, Fluid  AMBER (*)  YELLOW     Appearance, Fluid  CLOUDY (*)  CLEAR     WBC, Fluid  950  0 - 1000 (cu mm)     Neutrophil Count, Fluid  65 (*)  0 - 25 (%)     Lymphs, Fluid  20      Monocyte-Macrophage-Serous Fluid  14 (*)  50 - 90 (%)     Eos, Fluid  0      Other Cells, Fluid  1   BASOPHIL   PATHOLOGIST SMEAR REVIEW Status: Normal    Collection Time    04/06/12 2:40 PM   Component  Value  Range  Comment    Tech Review  BENIGN MESOTHELIAL CELLS.     GLUCOSE, CAPILLARY Status: Abnormal    Collection Time    04/06/12 4:54 PM   Component  Value  Range  Comment    Glucose-Capillary  118 (*)  70 - 99 (mg/dL)     GLUCOSE, CAPILLARY Status: Abnormal    Collection Time    04/06/12 9:18 PM   Component  Value  Range  Comment    Glucose-Capillary  138 (*)  70 - 99 (mg/dL)     Comment 1  Documented in Chart      Comment 2  Notify RN     CBC Status: Abnormal    Collection Time    04/07/12 6:20 AM   Component  Value  Range  Comment    WBC  10.7 (*)  4.0 - 10.5 (K/uL)     RBC  3.01 (*)  4.22 - 5.81 (MIL/uL)     Hemoglobin  8.6 (*)  13.0 - 17.0 (g/dL)     HCT  26.5 (*)  39.0 - 52.0 (%)     MCV  88.0  78.0 - 100.0 (fL)     MCH  28.6  26.0 - 34.0 (pg)     MCHC  32.5  30.0 - 36.0 (g/dL)     RDW  14.8  11.5 - 15.5 (%)     Platelets  445 (*)  150 - 400 (K/uL)    COMPREHENSIVE METABOLIC PANEL Status: Abnormal    Collection Time    04/07/12 6:20 AM   Component  Value  Range  Comment    Sodium  136  135 - 145 (mEq/L)     Potassium  4.2  3.5 - 5.1 (mEq/L)     Chloride  101  96 - 112 (mEq/L)     CO2  26  19 - 32 (mEq/L)     Glucose, Bld  171 (*)  70 - 99 (mg/dL)     BUN  21  6 - 23 (mg/dL)     Creatinine, Ser  1.57 (*)  0.50 - 1.35 (mg/dL)     Calcium  8.7  8.4 - 10.5 (mg/dL)     Total Protein  7.0  6.0 - 8.3 (g/dL)     Albumin  2.0 (*)  3.5 - 5.2 (g/dL)     AST  26  0 - 37 (U/L)     ALT  48  0 - 53 (U/L)     Alkaline Phosphatase  909 (*)  39 - 117 (U/L)     Total Bilirubin  0.4  0.3 - 1.2 (mg/dL)     GFR calc non Af Amer  52 (*)  >90 (mL/min)     GFR calc Af Amer  60 (*)  >90 (mL/min)  GLUCOSE, CAPILLARY Status: Abnormal    Collection Time    04/07/12 7:13 AM   Component  Value  Range  Comment    Glucose-Capillary  172 (*)  70 - 99 (mg/dL)     Comment 1  Documented in Chart      Comment 2  Notify RN     GLUCOSE, CAPILLARY Status: Abnormal    Collection Time    04/07/12 11:29 AM   Component  Value  Range  Comment    Glucose-Capillary  117 (*)  70 - 99 (mg/dL)     Comment 1  Notify RN      Comment 2  Documented in Chart      Dg Chest Port 1 View  04/06/2012 *RADIOLOGY REPORT* Clinical Data:  Status post right thoracentesis. Question pneumothorax. PORTABLE CHEST - 1 VIEW Comparison: Plain film chest 04/05/2012. Findings: No pneumothorax is identified after thoracentesis. Right greater than left pleural effusions persist. The patient's right effusion appears somewhat decreased. Bilateral airspace disease appears unchanged. Heart size normal. IMPRESSION: 1. Negative for pneumothorax after thoracentesis. 2. Some decrease in right effusion. Left effusion and bilateral airspace disease persist. Original Report Authenticated By: Arvid Right. Luther Parody, M.D.   Review of Systems  Constitutional: Positive for fever, chills, weight loss and malaise/fatigue.  Respiratory: Positive for cough, sputum production and shortness of breath. Negative for hemoptysis.  Pleuritic right CP  Cardiovascular: Negative for orthopnea, claudication and leg swelling.  Gastrointestinal: Positive for nausea.  Musculoskeletal: Positive for myalgias.  Neurological: Positive for weakness.  All other systems reviewed and are negative.   Blood pressure 137/86, pulse 67, temperature 98.8 F (37.1 C), temperature source Oral, resp. rate 20, height 6\' 2"  (1.88 m), weight 201 lb 4.5 oz (91.3 kg), SpO2 96.00%.  Physical Exam  Constitutional: He is oriented to person, place, and time. He appears well-developed and well-nourished. No distress.  HENT:  Head: Normocephalic and atraumatic.  Eyes: EOM are normal. Pupils are equal, round, and reactive to light.  Neck: Neck supple. No JVD present. No tracheal deviation present.  Cardiovascular: Normal rate, regular rhythm, normal heart sounds and intact distal pulses.  Respiratory: He has no wheezes.  Diminished BS bilaterally  GI: Soft. There is no tenderness.  Lymphadenopathy:  He has no cervical adenopathy.  Neurological: He is alert and oriented to person, place, and time. No cranial nerve deficit.  Skin: Skin is warm and dry.   Assessment/Plan:  46 yo male with pneumonia  and a right parapneumonic effusion. He is currently being treated with Zyvox and Flagyl PO. Attempts to drain the pleural fluid with ultrasound guidance have not been successful. He did have 3cc of fluid accessed on the most recent attempt. The initial gram stains were negative and the fluid had 950 WBC.  His CT on admission showed that the vast majority of the right basilar opacity on his admission CXR was due to dense consolidation of the RLL. Although his CXR suggests he may have more fluid at present, he needs a repeat CT to better evaluate the process in his right chest. CT ordered. Further suggestions after CT reviewed.  [name] [name] C  04/07/2012, 6:52 PM

## 2012-04-09 NOTE — Anesthesia Postprocedure Evaluation (Signed)
  Anesthesia Post-op Note  Patient: [name] [name]  Procedure(s) Performed: Procedure(s) (LRB): VIDEO ASSISTED THORACOSCOPY (VATS)/EMPYEMA (Right)  Patient Location: PACU  Anesthesia Type: General  Level of Consciousness: awake  Airway and Oxygen Therapy: Patient Spontanous Breathing  Post-op Pain: mild  Post-op Assessment: Post-op Vital signs reviewed  Post-op Vital Signs: Reviewed  Complications: No apparent anesthesia complications

## 2012-04-09 NOTE — Preoperative (Addendum)
Beta Blockers   Reason not to administer Beta Blockers:Not Applicable, metoprolol 50 mg 2100 4/17

## 2012-04-10 ENCOUNTER — Inpatient Hospital Stay (HOSPITAL_COMMUNITY): Payer: Managed Care, Other (non HMO)

## 2012-04-10 ENCOUNTER — Inpatient Hospital Stay
Admission: RE | Admit: 2012-04-10 | Payer: Managed Care, Other (non HMO) | Source: Ambulatory Visit | Admitting: Nephrology

## 2012-04-10 LAB — CBC
HCT: 25.1 % — ABNORMAL LOW (ref 39.0–52.0)
Hemoglobin: 8.5 g/dL — ABNORMAL LOW (ref 13.0–17.0)
MCH: 29.9 pg (ref 26.0–34.0)
MCHC: 33.9 g/dL (ref 30.0–36.0)
MCV: 88.4 fL (ref 78.0–100.0)
Platelets: 409 10*3/uL — ABNORMAL HIGH (ref 150–400)
RBC: 2.84 MIL/uL — ABNORMAL LOW (ref 4.22–5.81)
RDW: 14.9 % (ref 11.5–15.5)
WBC: 11.6 10*3/uL — ABNORMAL HIGH (ref 4.0–10.5)

## 2012-04-10 LAB — BODY FLUID CULTURE
Culture: NO GROWTH
Special Requests: 1

## 2012-04-10 LAB — GLUCOSE, CAPILLARY
Glucose-Capillary: 114 mg/dL — ABNORMAL HIGH (ref 70–99)
Glucose-Capillary: 129 mg/dL — ABNORMAL HIGH (ref 70–99)
Glucose-Capillary: 154 mg/dL — ABNORMAL HIGH (ref 70–99)
Glucose-Capillary: 175 mg/dL — ABNORMAL HIGH (ref 70–99)
Glucose-Capillary: 214 mg/dL — ABNORMAL HIGH (ref 70–99)

## 2012-04-10 LAB — BLOOD GAS, ARTERIAL
Acid-Base Excess: 0.7 mmol/L (ref 0.0–2.0)
Bicarbonate: 24.6 mEq/L — ABNORMAL HIGH (ref 20.0–24.0)
Drawn by: 31843
O2 Content: 2 L/min
O2 Saturation: 94.5 %
Patient temperature: 98.6
TCO2: 25.7 mmol/L (ref 0–100)
pCO2 arterial: 37.9 mmHg (ref 35.0–45.0)
pH, Arterial: 7.427 (ref 7.350–7.450)
pO2, Arterial: 67 mmHg — ABNORMAL LOW (ref 80.0–100.0)

## 2012-04-10 MED ORDER — INSULIN ASPART 100 UNIT/ML ~~LOC~~ SOLN
0.0000 [IU] | Freq: Three times a day (TID) | SUBCUTANEOUS | Status: DC
  Administered 2012-04-10: 3 [IU] via SUBCUTANEOUS
  Administered 2012-04-11: 2 [IU] via SUBCUTANEOUS
  Administered 2012-04-11 (×2): 3 [IU] via SUBCUTANEOUS
  Administered 2012-04-12 (×2): 2 [IU] via SUBCUTANEOUS
  Administered 2012-04-12 – 2012-04-13 (×3): 3 [IU] via SUBCUTANEOUS
  Administered 2012-04-13 – 2012-04-14 (×2): 2 [IU] via SUBCUTANEOUS

## 2012-04-10 MED ORDER — INSULIN ASPART 100 UNIT/ML ~~LOC~~ SOLN
0.0000 [IU] | Freq: Every day | SUBCUTANEOUS | Status: DC
  Administered 2012-04-10 – 2012-04-12 (×3): 2 [IU] via SUBCUTANEOUS

## 2012-04-10 NOTE — Progress Notes (Signed)
Utilization review completed. Lizabeth Leyden RN, CCM

## 2012-04-10 NOTE — Progress Notes (Signed)
[name] [name] CSN:[id number],MRN:[id number] is a 46 y.o. male,  Outpatient Primary MD for the patient is [name], MD, MD  Chief Complaint  Patient presents with  . Emesis        Subjective:    [name] [name] seen doing well.  In good spirits.  Good appetitie.  D#1 s/p VATS.  C/o mod pain in R side at area of surgery-mod controlled an dhas been encouraged to use oral meds.  Want to know when he can get up.  No N/V.  Mild SOB  Objective:   Filed Vitals:   04/09/12 2116 04/10/12 0422 04/10/12 0447 04/10/12 1405  BP:   148/92 148/90  Pulse:   82 79  Temp:   98.2 F (36.8 C) 98.6 F (37 C)  TempSrc:   Oral Oral  Resp: 16 18 18 18   Height:      Weight:      SpO2: 96% 94% 93% 95%    Wt Readings from Last 3 Encounters:  04/04/12 91.3 kg (201 lb 4.5 oz)  04/04/12 91.3 kg (201 lb 4.5 oz)  04/04/12 91.3 kg (201 lb 4.5 oz)     Intake/Output Summary (Last 24 hours) at 04/10/12 1613 Last data filed at 04/10/12 1500  Gross per 24 hour  Intake   2100 ml  Output    715 ml  Net   1385 ml    Exam Awake Alert, Oriented *3, No new F.N deficits, Normal affect [place].AT,PERRAL. Supple Neck,No JVD, No cervical lymphadenopathy appriciated.  Symmetrical Chest wall movement, Good air movement bilaterally-Chest tube in place R side, with drainage, some Egophony. RRR,No Gallops,Rubs or new Murmurs, No Parasternal Heave +ve B.Sounds, Abd Soft, Non tender, No organomegaly appriciated, No rebound -guarding or rigidity. No Cyanosis, Clubbing , 1+edema, No new Rash or bruise    Data Review   CBC  Lab 04/10/12 0605 04/09/12 0605 04/08/12 0650 04/07/12 0620 04/06/12 0655  WBC 11.6* 9.5 10.0 10.7* 7.3  HGB 8.5* 9.3* 8.9* 8.6* 8.2*  HCT 25.1* 29.0* 27.0* 26.5* 25.2*  PLT 409* 529* 476* 445* 350  MCV 88.4 87.9 85.4 88.0 88.4  MCH 29.9 28.2 28.2 28.6 28.8  MCHC 33.9 32.1 33.0 32.5 32.5  RDW 14.9 14.6 14.4 14.8 14.6  LYMPHSABS -- -- -- -- --  MONOABS -- -- -- -- --  EOSABS -- --  -- -- --  BASOSABS -- -- -- -- --  BANDABS -- -- -- -- --    Chemistries   Lab 04/09/12 0605 04/08/12 0650 04/07/12 0620 04/06/12 0655 04/05/12 0556 04/04/12 0720  NA 138 138 136 141 137 --  K 4.0 4.2 4.2 4.6 4.2 --  CL 103 105 101 105 103 --  CO2 25 23 26 29 26  --  GLUCOSE 149* 97 171* 71 104* --  BUN 20 19 21 22 23  --  CREATININE 1.46* 1.50* 1.57* 1.66* 1.69* --  CALCIUM 9.0 8.7 8.7 8.9 8.6 --  MG -- -- -- -- -- --  AST 31 41* 26 29 -- 69*  ALT 46 48 48 61* -- 101*  ALKPHOS 814* 829* 909* 1010* -- 1151*  BILITOT 0.3 0.3 0.4 0.5 -- 0.4   ------------------------------------------------------------------------------------------------------------------ estimated creatinine clearance is 74.3 ml/min (by C-G formula based on Cr of 1.46). ------------------------------------------------------------------------------------------------------------------ No results found for this basename: HGBA1C:2 in the last 72 hours ------------------------------------------------------------------------------------------------------------------ No results found for this basename: CHOL:2,HDL:2,LDLCALC:2,TRIG:2,CHOLHDL:2,LDLDIRECT:2 in the last 72 hours ------------------------------------------------------------------------------------------------------------------ No results found for this basename: TSH,T4TOTAL,FREET3,T3FREE,THYROIDAB in the last 72 hours ------------------------------------------------------------------------------------------------------------------  No results found for this basename: VITAMINB12:2,FOLATE:2,FERRITIN:2,TIBC:2,IRON:2,RETICCTPCT:2 in the last 72 hours  Coagulation profile  Lab 04/09/12 0605  INR 1.22  PROTIME --    No results found for this basename: DDIMER:2 in the last 72 hours  Cardiac Enzymes No results found for this basename: CK:3,CKMB:3,TROPONINI:3,MYOGLOBIN:3 in the last 168  hours ------------------------------------------------------------------------------------------------------------------ No components found with this basename: POCBNP:3  Micro Results Recent Results (from the past 240 hour(s))  WOUND CULTURE     Status: Normal   Collection Time   04/02/12 12:30 PM      Component Value Range Status Comment   Specimen Description WOUND MOUTH RIGHT   Final    Special Requests ORAL MUCOSA   Final    Gram Stain     Final    Value: FEW WBC PRESENT, PREDOMINANTLY PMN     NO SQUAMOUS EPITHELIAL CELLS SEEN     NO ORGANISMS SEEN   Culture NO GROWTH 2 DAYS   Final    Report Status 04/04/2012 FINAL   Final   BODY FLUID CULTURE     Status: Normal   Collection Time   04/06/12  2:40 PM      Component Value Range Status Comment   Specimen Description FLUID PLEURAL   Final    Special Requests 1 TUBE @ 2.2CC   Final    Gram Stain     Final    Value: MODERATE WBC PRESENT, PREDOMINANTLY PMN     NO ORGANISMS SEEN   Culture NO GROWTH 3 DAYS   Final    Report Status 04/10/2012 FINAL   Final   AFB CULTURE WITH SMEAR     Status: Normal (Preliminary result)   Collection Time   04/09/12  8:48 AM      Component Value Range Status Comment   Specimen Description PLEURAL FLUID RIGHT   Final    Special Requests PT ON ZYVOX FLAGYL VANCO   Final    ACID FAST SMEAR NO ACID FAST BACILLI SEEN   Final    Culture     Final    Value: CULTURE WILL BE EXAMINED FOR 6 WEEKS BEFORE ISSUING A FINAL REPORT   Report Status PENDING   Incomplete   FUNGUS CULTURE W SMEAR     Status: Normal (Preliminary result)   Collection Time   04/09/12  8:48 AM      Component Value Range Status Comment   Specimen Description PLEURAL FLUID RIGHT   Final    Special Requests PT ON ZYVOX FLAGYL VANCO   Final    Fungal Smear NO YEAST OR FUNGAL ELEMENTS SEEN   Final    Culture CULTURE IN PROGRESS FOR FOUR WEEKS   Final    Report Status PENDING   Incomplete   BODY FLUID CULTURE     Status: Normal (Preliminary  result)   Collection Time   04/09/12  8:48 AM      Component Value Range Status Comment   Specimen Description PLEURAL FLUID RIGHT   Final    Special Requests PT ON ZYVOX FLAGYL VANCO   Final    Gram Stain     Final    Value: FEW WBC PRESENT,BOTH PMN AND MONONUCLEAR     NO SQUAMOUS EPITHELIAL CELLS SEEN     NO ORGANISMS SEEN   Culture NO GROWTH 1 DAY   Final    Report Status PENDING   Incomplete   TISSUE CULTURE     Status: Normal (Preliminary result)   Collection Time  04/09/12  9:14 AM      Component Value Range Status Comment   Specimen Description TISSUE PLEURAL RIGHT   Final    Special Requests VISCERAL PLEURA NO 1 PT ON ZYVOX FLAGYL VANCO   Final    Gram Stain     Final    Value: NO WBC SEEN     FEW SQUAMOUS EPITHELIAL CELLS PRESENT     NO ORGANISMS SEEN   Culture NO GROWTH 1 DAY   Final    Report Status PENDING   Incomplete   TISSUE CULTURE     Status: Normal (Preliminary result)   Collection Time   04/09/12  9:16 AM      Component Value Range Status Comment   Specimen Description TISSUE PLEURAL RIGHT   Final    Special Requests VISCERAL PLEURA NO 2 PT ON ZYVOX FLAGYL VANCO   Final    Gram Stain     Final    Value: RARE WBC PRESENT, PREDOMINANTLY PMN     NO SQUAMOUS EPITHELIAL CELLS SEEN     NO ORGANISMS SEEN   Culture NO GROWTH 1 DAY   Final    Report Status PENDING   Incomplete     Radiology Reports Dg Chest 2 View  03/28/2012  *RADIOLOGY REPORT*  Clinical Data: Cough, right rib pain, emesis, recent pneumonia  CHEST - 2 VIEW  Comparison: 03/16/2012  Findings: Patchy right lower lobe opacity, suspicious for pneumonia, with associated small-to-moderate right pleural effusion.  Left basilar opacity, atelectasis versus pneumonia, with suspected small left pleural effusion.  No pneumothorax.  The heart is normal in size.  Interval removal of right subclavian PICC.  Visualized osseous structures are within normal limits.  IMPRESSION: Patchy right lower lobe opacity, suspicious  for pneumonia, with associated small-to-moderate right pleural effusion.  Left basilar opacity, atelectasis versus pneumonia, with suspected small left pleural effusion.  Original Report Authenticated By: Julian Hy, M.D.   Dg Chest 2 View  03/16/2012  *RADIOLOGY REPORT*  Clinical Data: History of pneumonia.  CHEST - 2 VIEW  Comparison: 03/11/2012.  Findings: Tip of right PICC terminates in right atrial region.  No pneumothorax is seen.  There is some atelectasis and infiltrate in the medial posterior aspect of the left lung.  There are atelectasis and infiltrative densities seen within the right perihilar region and within the right midlung and right lower lung areas with consolidation and atelectasis.  There is improvement in the amount of infiltrate compared to previous study.   On the lateral image there is continued abnormal opacity inferiorly and posteriorly.  Indistinctness of costophrenic angles may reflect an element of pleural effusion.  IMPRESSION: Right PICC in place.  Some atelectasis and infiltrative density in the medial posterior left lung inferiorly. here are atelectasis and infiltrative densities seen within the right perihilar region and within the right midlung and right lower lung areas with consolidation and atelectasis.  There is improvement in the amount of infiltrative compared to previous study but clearing has not occurred. Question small amounts of pleural effusion.  Original Report Authenticated By: Delane Ginger, M.D.   Dg Chest 2 View  03/11/2012  *RADIOLOGY REPORT*  Clinical Data: Shortness of breath with right lower chest pain and fever.  CHEST - 2 VIEW  Comparison: None.  Findings: Trachea is midline.  Heart size normal.  There is dense airspace consolidation in the right lower lobe.  Question mild left lower lobe air space disease.  No left pleural fluid.  IMPRESSION:  1.  Dense airspace consolidation in the right lower lobe is most consistent with pneumonia.  Follow-up to  clearing is recommended, to exclude a centrally obstructing mass. 2.  Question mild left lower lobe air space disease.  Original Report Authenticated By: Luretha Rued, M.D.   Ct Chest Wo Contrast  03/28/2012  *RADIOLOGY REPORT*  Clinical Data: Cough, right-sided chest pain  CT CHEST WITHOUT CONTRAST  Technique:  Multidetector CT imaging of the chest was performed following the standard protocol without IV contrast.  Comparison: Chest radiographs dated 03/28/2012.  CT chest dated 03/11/2012.  Findings: Patchy posterior right upper lobe opacity and near complete right lower lobe consolidation, suspicious for pneumonia. Associated moderate right pleural effusion.  Patchy left lower lobe opacity, atelectasis versus pneumonia, with associated small left pleural effusion.  No pneumothorax.  Heart is normal in size.  Small pericardial effusion.  Visualized upper abdomen is unremarkable.  Mild degenerative changes of the visualized thoracolumbar spine.  IMPRESSION: Right upper/lower lobe pneumonia, with associated moderate right pleural effusion.  Patchy left lower lobe opacity, atelectasis versus pneumonia, with associated small left pleural effusion.  Original Report Authenticated By: Julian Hy, M.D.   Ct Angio Chest W/cm &/or Wo Cm  03/11/2012  *RADIOLOGY REPORT*  Clinical Data: Fever.  Cough.  Weakness.  Short of breath.  CT ANGIOGRAPHY CHEST  Technique:  Multidetector CT imaging of the chest using the standard protocol during bolus administration of intravenous contrast. Multiplanar reconstructed images including MIPs were obtained and reviewed to evaluate the vascular anatomy.  Contrast: 8mL OMNIPAQUE IOHEXOL 350 MG/ML IV SOLN  Comparison: Chest radiography same day  Findings: Pulmonary arterial opacification is excellent.  There are no pulmonary emboli.  There is consolidative pneumonia throughout the right lower lobe with minor involvement of the right upper lobe.  There is consolidative pneumonia  less extensively in the posterior aspect of the left lower lobe.  There is a small amount of pleural fluid on the right.  There is a tiny amount of pericardial fluid.  There are small mediastinal lymph nodes, likely reactive.  No upper abdominal pathology is seen.  IMPRESSION: No pulmonary emboli.  Consolidative pneumonia throughout the right lower lobe.  Partial involvement of the left lower lobe by consolidative  pneumonia. Minimal involvement of the right upper lobe.  Original Report Authenticated By: Jules Schick, M.D.   Nm Hepatobiliary  03/14/2012  *RADIOLOGY REPORT*  Clinical Data: Abdominal pain, evaluate for cholecystitis  NUCLEAR MEDICINE HEPATOHBILIARY INCLUDE GB  Radiopharmaceutical:  5.5 mCi technetium 20m Choletec  Comparison: Ultrasound dated 03/12/2012.  Findings: Normal hepatic uptake.  Small bowel is visible within 20 minutes.  Gallbladder is visible within 30 minutes, confirming cystic duct patency.  IMPRESSION: Normal hepatobiliary nuclear medicine scan.  Original Report Authenticated By: Julian Hy, M.D.   [place] Chest  03/28/2012  *RADIOLOGY REPORT*  Clinical Data: 46 year old male with pneumonia and pleural effusions on chest CT.  Assess for thoracentesis.  CHEST ULTRASOUND  Comparison: Chest CT without contrast 03/28/2012 and earlier.  Findings: Gray-scale imaging of the right hemithorax demonstrates a small component of pleural fluid, with a larger adjacent component of consolidated lung.  No free fluid seen subjacent to the right hemidiaphragm.  The quantity of pleural fluid identified is insufficient for thoracentesis.  IMPRESSION: Right pleural effusion and larger component of right lung consolidation on ultrasound.  Pleural fluid component quantity insufficient for thoracentesis at this time.  Original Report Authenticated By: Randall An, M.D.   US Abdomen Complete  03/28/2012  *RADIOLOGY REPORT*  Clinical Data:  Elevated LFTs.  Right upper quadrant pain and nausea.   ABDOMINAL ULTRASOUND COMPLETE  Comparison:  03/12/2012  Findings:  Gallbladder:  No gallstones, gallbladder wall thickening, or pericholecystic fluid. Negative sonographic Murphy's sign reported by the sonographer.  Common Bile Duct:  Within normal limits in caliber.  Liver: No focal mass lesion identified.  Within normal limits in parenchymal echogenicity.  IVC:  Appears normal.  Pancreas:  No abnormality identified.  Spleen:  Within normal limits in size and echotexture.  Right kidney:  Normal in size and parenchymal echogenicity.  No evidence of mass or hydronephrosis.  Left kidney:  Normal in size and parenchymal echogenicity.  No evidence of mass or hydronephrosis.  Abdominal Aorta:  No aneurysm identified.  Other findings:  Small bilateral pleural effusions.  Small amount of upper abdominal ascites posterior to the liver and spleen.  IMPRESSION:  1.  Small amount of abdominal ascites. 2.  Small pleural effusions.  Original Report Authenticated By: Angelita Ingles, M.D.   US Abdomen Complete  03/12/2012  *RADIOLOGY REPORT*  Clinical Data:  Right upper quadrant abdominal pain, elevated liver function tests  ABDOMINAL ULTRASOUND COMPLETE  Comparison:  Chest CT 03/11/2012 with incomplete visualization of the liver  Findings:  Gallbladder:  Minimal dependent sludge is noted within the gallbladder.  The gallbladder is not distended, with gallbladder wall thickness measuring borderline thickened at 4 mm.  No shadowing calculus or sonographic Murphy's sign noted.  Common Bile Duct:  Within normal limits in caliber.  Liver: No focal mass lesion identified.  Within normal limits in parenchymal echogenicity.  IVC:  Appears normal.  Pancreas:  No abnormality identified.  Spleen:  Within normal limits in size and echotexture.  Right kidney:  Normal in size and parenchymal echogenicity.  No evidence of mass or hydronephrosis.  Left kidney:  Normal in size and parenchymal echogenicity.  No evidence of mass or  hydronephrosis.  Abdominal Aorta:  Small amount of ascites is noted.  Trace left pleural effusion.  IMPRESSION: Minimal sludge within the gallbladder with borderline wall thickening.  This could indicate cholecystitis in the appropriate clinical context.  Trace ascites.  Original Report Authenticated By: Arline Asp, M.D.   Ct Maxillofacial W/cm  04/01/2012  *RADIOLOGY REPORT*  Clinical Data: Swelling on the right for several weeks.  Jaw pain.  CT MAXILLOFACIAL WITH CONTRAST  Technique:  Multidetector CT imaging of the maxillofacial structures was performed with intravenous contrast. Multiplanar CT image reconstructions were also generated.  Contrast: 35mL OMNIPAQUE IOHEXOL 300 MG/ML  SOLN  Comparison: None.  Findings: In the right buccal region, there is a complex fluid collection spanning over 5 x 2.1 x 1.1 cm.  This is immediately adjacent to the significant dental disease.  Mild cervical spondylotic changes.  Visualized mastoid air cells, middle ear cavities and paranasal sinuses are clear.  IMPRESSION: Probable abscess right buccal region most likely dental in origin given the degree of significant caries and periapical lucency of the molars.  Results called to Bari Mantis patient's nurse 04/01/2012 3:20 a.m. with request to call report first in the morning.  Original Report Authenticated By: Doug Sou, M.D.   Dg Chest Port 1 View  03/31/2012  *RADIOLOGY REPORT*  Clinical Data: Assess infiltrate.  PORTABLE CHEST - 1 VIEW  Comparison: 03/28/2012  Findings: Very low lung volumes.  Worsening bibasilar opacities and probable edema.  Bilateral effusions.  Cardiomegaly.  IMPRESSION: Increasing pulmonary edema, bibasilar opacities and effusions.  Very low lung volumes.  Original Report Authenticated By: Raelyn Number, M.D.    Echo  - Left ventricle: The cavity size was normal. Wall thickness was increased in a pattern of mild LVH. Systolic function was normal. The estimated ejection fraction was  in the range of 55% to 60%. - Atrial septum: No defect or patent foramen ovale was identified. - Pericardium, extracardiac: Small pericardial effusion with no evidence of tamponade  Scheduled Meds:    . acetaminophen  1,000 mg Intravenous Q6H  . amLODipine  5 mg Oral Daily  . atorvastatin  20 mg Oral q1800  . bisacodyl  10 mg Oral Daily  . fentaNYL   Intravenous Q4H  . ferrous sulfate  325 mg Oral BID WC  . guaiFENesin  1,200 mg Oral BID  . HYDROmorphone      . insulin aspart  0-15 Units Subcutaneous TID WC  . insulin aspart  0-5 Units Subcutaneous QHS  . ketorolac  30 mg Intravenous Q6H  . labetalol      . labetalol  10 mg Intravenous Once  . linezolid  600 mg Oral Q12H  . metoprolol tartrate  50 mg Oral BID  . metroNIDAZOLE  500 mg Oral Q8H  . DISCONTD: docusate sodium  100 mg Oral BID  . DISCONTD: insulin aspart  0-24 Units Subcutaneous Q6H  . DISCONTD: insulin aspart  0-9 Units Subcutaneous TID WC  . DISCONTD: insulin glargine  20 Units Subcutaneous QHS  . DISCONTD: lidocaine  2 patch Transdermal Q24H  . DISCONTD: metoprolol tartrate  50 mg Oral BID  . DISCONTD: senna  1 tablet Oral BID  . DISCONTD: sodium chloride  3 mL Intravenous Q12H  . DISCONTD: vancomycin  1,000 mg Intravenous 60 min Pre-Op  . DISCONTD: vancomycin  1,000 mg Intravenous Q12H   Continuous Infusions:    . sodium chloride 10 mL (04/10/12 1315)  . lactated ringers Stopped (04/09/12 1749)   PRN Meds:.diphenhydrAMINE, diphenhydrAMINE, naloxone, ondansetron (ZOFRAN) IV, oxyCODONE, oxyCODONE-acetaminophen, potassium chloride, senna-docusate, sodium chloride, traMADol, DISCONTD: sodium chloride, DISCONTD: acetaminophen, DISCONTD: acetaminophen, DISCONTD: albuterol, DISCONTD: HYDROcodone-acetaminophen, DISCONTD: HYDROmorphone, DISCONTD: morphine, DISCONTD: morphine, DISCONTD: ondansetron (ZOFRAN) IV, DISCONTD: ondansetron, DISCONTD: oxyCODONE DISCONTD: sodium chloride, DISCONTD: zolpidem  Assessment &  Plan   Brief narrative:  46 year old man recently treated for streptococcus pneumoniae bacteremia and pneumonia , anemia, right-sided facial swelling and elevated liver enzymes along with acute renal failure during his first hospitalization about 2 weeks ago was admitted on 03/12/2012 and discharged on 03/17/2012, patient had received a PICC line during his first admission and was then discharged on IV Rocephin to home as recommended by ID physician Dr. Linus Salmons.  He presented back to the hospital 03/28/2010 with fever and chills, this time he was diagnosed with buccal abscess, along with bilateral pleural effusions with possible right-sided parapneumonic effusion, persistently elevated liver enzymes despite negative hepatitis panel , mild gallbladder sludge on ultrasound but with negative HIDA, he was being seen by GI, pulmonary, ID, renal, hematology.  CT scan of the abdomen and pelvis which was done on April 14 by hematology short moderate to large sized bilateral pleural effusions, patient did have right-sided pneumonia and right-sided pleuritic chest pain throughout his hospital stay so pulmonary was the cord on the 15th, I do ultrasound-guidance thoracentesis was attempted on the 15th by fibrofatty which he is only 5 cc of fluid brown in color, showing large amount of WBCs and moderate neutrophils, further studies are pending, will wait for pulmonary and ID followup in  the future course of action i.e. antibiotics/chest tube depending on that recommendation. I have also discussed with the patient that it is a [facility] he continues to require IV antibiotics +/- chest tube placement if deemed necessary by pulmonary.  His antibiotics have been switched around by infectious disease physicians, he continues to have moderate to severe anemia probably anemia of chronic disease along with anemia of acute disease in combination with iron deficiency worsened by  frequent lab draws, his iron has been replaced intravenously and now is on oral supplements, has been seen by hematology and is being followed up by them, GI has ordered further immunological testing for elevated transaminases, pulmonary has evaluated him several times for his right-sided parapneumonic effusion however effusion size is too small to be tapped who on ultrasound, He has redeveloped mild non-oliguric acute renal insufficiency again with negative ultrasound, this is likely due to vancomycin and anemia, vancomycin has been stopped patient is improving post gentle IV fluids. His edema has improved thereafter Lasix has been stopped also. His creatinine is starting to improve gently.  He has elevated LFTs with positive ANA, right upper quadrant ultrasound x2 along with high-dose scan has been negative no clinical signs of acute cholecystitis, patient is being followed by Dr.Mann and he will need to follow with GI upon discharge in about 3 weeks for followup on Serological tests ordered by Dr. Collene Mares, and colonoscopy and EGD biopsy results.  He also had left I visual field defect i.e. loose body in his left eye for which he was seen by ophthalmologist Dr.Tanner who diagnosed him with Proliferative diabetic retinopathy with macular edema and vitreous hemorrhage and will need continued outpatient IV followup along with aggressive risk factor modification i.e. Diabetes, hypertension dyslipidemia.    Past medical history:  Diabetes mellitus    Consultations done during hospitalization: And followups needed post discharge  Pulmonology -Dr. Lamonte Sakai Gastroenterology -Dr. Collene Mares ENT (no followup needed) Oral Surgery - Dr. Hoyt Koch Hematology - Dr Jamse Arn Tanner-Opthalmology - will need outpatient Retina specialist to followup CTS called for VATS 04/07/2012 PT    Procedures:  April 9: Upper endoscopy: LA grade C esophagitis. Protonix 40 mg by mouth twice a day- Dr Benson [place] April 10: Colonoscopy - Dr  Collene Mares April 11: I&D of Rt Parotid Abscess - Dr Hoyt Koch April 18: VATs by Dr. Roxan Hockey   Antibiotics:  April 6: Vancomycin  April 6: Zosyn ABX on April 12th switched to Zyvox-Levaquin-Flagyl      #1. 46 y.o. male admitted on 03/27/2012, with recurrent pneumonia along with pleural effusion concerning for empyema, recent admission for Streptococcus pneumoniae bacteremia and was discharged on IV Rocephin, now comes back with fever chills. ID now changed ABX to Zyvox-Linezolide and Falgyl due to ? Marrow suppression and ARF on 12th, D/W Dr Wendie Agreste, will monitor. Patient continues to have quite a bit of oxygen demand along with exertional shortness of breath, off note CT abdomen done on 04/05/2012 by Haem-Onc shows significantly large amounts of bilateral pleural effusions,, a thoracentesis was attempted on 04/06/2012 which he did 5-10 cc of brown colored fluid, with large amount of WBCs and neutrophils Patient had VATs done 4/18.   Hopefully can determine organism now with cultures and narrow to appropriate meds.  Post-Op CXR showed decrease in R effusions pleural effusions All cultures pending.  Continue  Linezolide, Flagyl  #2.Mixed pattern elevated LFTs: Etiology not clear. Abdominal ultrasound unremarkable on the 6th, hepatitis panel negative  and recent HIDA scan was negative. ?? Possibly related to effusion. Has bumped up again on 12th, GI following patient, repeat US shows evidence of gallbladder sludge. Surgery thought no surgical intervention given negative ultrasound, with recent negative HIDA scan, echogram shows mild LV hypertrophy but no frank acute heart failure-trending downwards  #3. Hyponatremia: Stable. Likely related to current pneumonia/pulmonary process. Resolved   #4. Anemia: Likely combination of anemia of chronic disease, iron deficiency, anemia acute disease. No history of bleeding. Fecal occult blood negative. No previous data prior to last hospitalization when he presented  with hemoglobin of 10 which quickly decreased, probably from dilution also could be a medication effect. His anemia panel does show severely low iron although ferritin is stable (however it is a acute phase reactant) . Appreciate gastroenterology evaluation, EGD showing grade C. esophagitis placed on PPI, limited colonoscopy also unremarkable, stable post 1 unit on 11th, ? Marrow suppression from Zosyn + AOAD  and iron deficiency, IV iron being given on recommendation of hematology we'll continue to monitor H&H which is improving, appreciate hematology input.  #5. Diabetes mellitus type 2, uncontrolled, A1c 15: Continue sliding scale insulin, Lantus, meal coverage, due to acute renal insufficiency patient's insulin dose has been reduced on 04/05/2012, few episodes of borderline hypoglycemia were noted-blood sugars as low with moderate to good control  CBG (last 3)   Basename 04/10/12 1140 04/10/12 0606 04/10/12 0010  GLUCAP 114* 129* 175*   #6. Right buccal mucosa mass: This developed during his last hospitalization. Was seen by ENT and oral surgery, underwent I&D of a right parotid abscess by Dr. Hoyt Koch on 04/02/2012, antibiotics as #1 above.  #7. Acute renal failure with edema - renal failure improved slowly, could have been due to combination of vancomycin and anemia, [place] stable, FeNa 1.37, urine does show mild proteinuria and since patient is diabetic we'll try to place him on ACE. but once creatinine is stable will need outpatient renal follow up. Continue to hold lasix, post gentle IVF, vancomycin stopped 03-03-12.  discussed the case on 04/05/2012 with nephrologist most likely this ATN which was improved in due course of time. Patient likely has underlying CKD due to his diabetes which was an extremely poor control as evidenced by his A1c of 15.7. We'll continue to monitor, trend is improving  #8. Tubular adenoma on colonic polyp biopsy outpatient GI followup.  #9. Left eye 10:00 position  sensation of purple colored loose body which per patient started yesterday evening. With no vision loss, no focal neurological deficit, no eye pain or headache. Have discussed it with Dr. Satira Sark patient was seen by him on 04/05/2012 and was diagnosed withProliferative diabetic retinopathy with macular edema and vitreous hemorrhage, patient will need outpatient retina specialist followup along with risk factor modification with strict control of diabetes mellitus type 2, hypertension, dyslipidemia.  #10 Htn-continue amlodipine 5 mg daily, metoprolol 50 mg by mouth twice a day-10 remains elevated will increase Norvasc   DVT Prophylaxis  SCDs Likely may be a candidate for select [facility] M.D on 04/10/2012 at [place]  [phone]

## 2012-04-10 NOTE — Progress Notes (Addendum)
1 Day Post-Op Procedure(s) (LRB): VIDEO ASSISTED THORACOSCOPY (VATS)/EMPYEMA (Right) Subjective:  [name] [name] complains of pain this morning.  He has been using his PCA with moderate relief.  I encouraged him to utilize the oral pain medications he has ordered as well.  He is requesting to get out of bed and walk.  Objective: Vital signs in last 24 hours: Temp:  [97 F (36.1 C)-98.2 F (36.8 C)] 98.2 F (36.8 C) (04/19 0447) Pulse Rate:  [72-89] 82  (04/19 0447) Cardiac Rhythm:  [-] Heart block (04/18 1950) Resp:  [14-30] 18  (04/19 0447) BP: (142-173)/(87-101) 148/92 mmHg (04/19 0447) SpO2:  [93 %-98 %] 93 % (04/19 0447) Arterial Line BP: (128-192)/(78-96) 180/88 mmHg (04/18 1500) FiO2 (%):  [37 %-38 %] 38 % (04/19 0422)  Intake/Output from previous day: 04/18 0701 - 04/19 0700 In: 3200 [I.V.:2900; IV Piggyback:300] Out: 1280 [Urine:1000; Blood:50; Chest Tube:230]    General appearance: alert, no distress and pale Heart: regular rate and rhythm Lungs: clear to auscultation bilaterally Abdomen: soft, non-tender; bowel sounds normal; no masses,  no organomegaly Wound: clean and dry  Lab Results:  [facility] 04/10/12 0605 04/09/12 0605  WBC 11.6* 9.5  HGB 8.5* 9.3*  HCT 25.1* 29.0*  PLT 409* 529*   BMET:  Basename 04/09/12 0605 04/08/12 0650  NA 138 138  K 4.0 4.2  CL 103 105  CO2 25 23  GLUCOSE 149* 97  BUN 20 19  CREATININE 1.46* 1.50*  CALCIUM 9.0 8.7    PT/INR:  Basename 04/09/12 0605  LABPROT 15.7*  INR 1.22   ABG    Component Value Date/Time   PHART 7.427 04/10/2012 0521   HCO3 24.6* 04/10/2012 0521   TCO2 25.7 04/10/2012 0521   O2SAT 94.5 04/10/2012 0521   CBG (last 3)   Basename 04/10/12 0606 04/10/12 0010 04/09/12 2100  GLUCAP 129* 175* 218*    Assessment/Plan: S/P Procedure(s) (LRB): VIDEO ASSISTED THORACOSCOPY (VATS)/EMPYEMA (Right)  2. Chest tube- no air leak appreciated, 230cc 24 hour output will leave on suction today 3. Pain  control- continue PCA, patient has Ultram, Oxy IR ordered, however patient has not been taking 4. Mobilize, out of bed and ambulate 5. Continue ABX    LOS: 14 days    BARRETT, ERIN 04/10/2012   Appetite good CXR shows some fluid loculated laterally and a small amount in the fissure Keep CT to suction except during ambulation today, water seal tomorrow

## 2012-04-10 NOTE — Op Note (Signed)
NAMEALEXANDRE, [name] [name] NO.:  [id number]  MEDICAL RECORD NO.:  [id number]  LOCATION:  2041                         FACILITY:  [facility]  PHYSICIAN:  Revonda Standard. Roxan Hockey, M.D.DATE OF BIRTH:  1966/10/28  DATE OF PROCEDURE:  04/09/2012 DATE OF DISCHARGE:                              OPERATIVE REPORT   PREOPERATIVE DIAGNOSIS:  Right parapneumonic effusion.  POSTOPERATIVE DIAGNOSIS:  Right parapneumonic effusion.  PROCEDURES: 1. Right video-assisted thoracoscopy. 2. Drainage of parapneumonic effusion. 3. Decortication of a small portion of the right lower lobe.  SURGEON:  Revonda Standard. Roxan Hockey, MD  ASSISTANT:  Lars Pinks, PA  ANESTHESIA:  General.  FINDINGS:  Complex partially loculated right parapneumonic effusion. Fluid amber, murky, but not frankly purulent.  Adhesions easily taken down.  A small amount of visceroparietal pleural peel on the lower portion of the right lower lobe, requiring decortication.  Minimal exudative debris in the costophrenic angle, requiring debridement. Otherwise, parietal pleura essentially uninvolved.  CLINICAL NOTE:  [name] [name] is a 46 year old gentleman with a recent pneumonia, who has developed a progressive right parapneumonic effusion despite antibiotic treatment.  The patient was advised to undergo right video-assisted thoracoscopy for drainage of the effusion and possible decortication of the lung.  The indications, risks, benefits, and alternatives were discussed in detail with the patient, and he understood and accepted the risks and agreed to proceed.  OPERATIVE NOTE:  [name] [name] was brought to the preoperative holding area on April 09, 2012.  There, the Anesthesia Service placed a central line and arterial blood pressure monitoring line.  He was already receiving antibiotics.  He received an additional dose of intravenous vancomycin prior to incision.  He was taken to the operating  room, anesthetized, and intubated with a double-lumen endotracheal tube.  PAS hoses were in place for DVT prophylaxis.  He was placed in a left lateral decubitus position, and the right chest was prepped and draped in usual sterile fashion.  An incision was made in approximately the seventh intercostal space in the midaxillary line and was carried through the skin and subcutaneous tissue.  The chest was entered bluntly using a hemostat.  Initial probing with a finger to make sure there were no significant adhesions in the area, accessed some fluid.  This fluid was amber and murky but not frankly purulent.  It was sent for both cytology as well as cultures.  Port was inserted through the incision, and the thoracoscope was placed in the chest, and it was noted that there was partial adherence of portions of the upper, middle, and lower lobes to the parietal pleura.  These were thin, filmy adhesions which were taken down easily.  There were some adhesions in the fissures as well.  There were some loculated fluid within the fissures which was evacuated.  The upper and middle lobes were normal in appearance.  The right lower lobe was consolidated.  There was a peel over the lower portion of the lower lobe as well as some fibrinous and exudative debris in the costophrenic gutter.  On entering the space between the lower lobe and the diaphragm, there was some bloody fluid present.  All of the fluid was evacuated.  The adhesions were taken  down to ensure that all of the fluid had been removed.  The exudative debris in the costophrenic angle was removed, this came off easily.  The majority of the parietal pleura was uninvolved.  The visceropleural peel on the lower lobe was decorticated, although again only a portion of the lower lobe was involved.  The chest was copiously irrigated with warm saline.  Two 32- [place] Blake drains were placed through separate incisions, one was directed along the  diaphragm and the other was directed posteriorly along the paravertebral gutter.  These were connected to Pleur-Evac drainage.  The small utility incision was closed with a #1 Vicryl fascial suture, followed by a 2-0 Vicryl subcutaneous suture, and a 3-0 Vicryl subcuticular suture.  Chest tubes were placed to suction.  All sponge, needle, and instrument counts were correct at the end of the procedure.  The patient was extubated in the operating room and taken to the postanesthetic care unit in good condition.     Revonda Standard Roxan Hockey, M.D.     SCH/MEDQ  D:  04/09/2012  T:  04/10/2012  Job:  [id number]

## 2012-04-10 NOTE — Progress Notes (Signed)
Subjective: Pt with some right sided pain but feeling better states 1.5L were removed from his chest  Antibiotics:  Anti-infectives     Start     Dose/Rate Route Frequency Ordered Stop   04/09/12 1830   vancomycin (VANCOCIN) IVPB 1000 mg/200 mL premix  Status:  Discontinued        1,000 mg 200 mL/hr over 60 Minutes Intravenous Every 12 hours 04/09/12 1817 04/09/12 1903   04/09/12 1741   vancomycin (VANCOCIN) IVPB 1000 mg/200 mL premix  Status:  Discontinued        1,000 mg 200 mL/hr over 60 Minutes Intravenous 60 min pre-op 04/09/12 1741 04/09/12 1747   04/07/12 1730   metroNIDAZOLE (FLAGYL) tablet 500 mg        500 mg Oral 3 times per day 04/07/12 1653     04/03/12 1400   levofloxacin (LEVAQUIN) IVPB 750 mg  Status:  Discontinued        750 mg 100 mL/hr over 90 Minutes Intravenous Every 24 hours 04/03/12 1155 04/06/12 1119   04/03/12 1200   metroNIDAZOLE (FLAGYL) IVPB 500 mg        500 mg 100 mL/hr over 60 Minutes Intravenous Every 8 hours 04/03/12 1145 04/04/12 1159   04/03/12 1200   linezolid (ZYVOX) tablet 600 mg        600 mg Oral Every 12 hours 04/03/12 1145     03/31/12 2000   piperacillin-tazobactam (ZOSYN) IVPB 3.375 g  Status:  Discontinued        3.375 g 12.5 mL/hr over 240 Minutes Intravenous Every 8 hours 03/31/12 1841 04/03/12 1145   03/28/12 1000   vancomycin (VANCOCIN) IVPB 1000 mg/200 mL premix  Status:  Discontinued        1,000 mg 200 mL/hr over 60 Minutes Intravenous Every 8 hours 03/28/12 0715 04/03/12 1008   03/28/12 1000   piperacillin-tazobactam (ZOSYN) IVPB 3.375 g  Status:  Discontinued        3.375 g 12.5 mL/hr over 240 Minutes Intravenous 3 times per day 03/28/12 0726 03/31/12 1841   03/28/12 0200   vancomycin (VANCOCIN) IVPB 1000 mg/200 mL premix        1,000 mg 200 mL/hr over 60 Minutes Intravenous  Once 03/28/12 0148 03/28/12 0413   03/28/12 0200  piperacillin-tazobactam (ZOSYN) IVPB 3.375 g       3.375 g 12.5 mL/hr over 240 Minutes  Intravenous  Once 03/28/12 0148 03/28/12 0626          Medications: Scheduled Meds:    . acetaminophen  1,000 mg Intravenous Q6H  . amLODipine  5 mg Oral Daily  . atorvastatin  20 mg Oral q1800  . bisacodyl  10 mg Oral Daily  . fentaNYL   Intravenous Q4H  . ferrous sulfate  325 mg Oral BID WC  . guaiFENesin  1,200 mg Oral BID  . HYDROmorphone      . insulin aspart  0-15 Units Subcutaneous TID WC  . insulin aspart  0-5 Units Subcutaneous QHS  . ketorolac  30 mg Intravenous Q6H  . labetalol      . linezolid  600 mg Oral Q12H  . metoprolol tartrate  50 mg Oral BID  . metroNIDAZOLE  500 mg Oral Q8H  . DISCONTD: docusate sodium  100 mg Oral BID  . DISCONTD: insulin aspart  0-24 Units Subcutaneous Q6H  . DISCONTD: insulin aspart  0-9 Units Subcutaneous TID WC  . DISCONTD: insulin glargine  20 Units Subcutaneous QHS  . DISCONTD:  lidocaine  2 patch Transdermal Q24H  . DISCONTD: metoprolol tartrate  50 mg Oral BID  . DISCONTD: senna  1 tablet Oral BID  . DISCONTD: sodium chloride  3 mL Intravenous Q12H  . DISCONTD: vancomycin  1,000 mg Intravenous 60 min Pre-Op  . DISCONTD: vancomycin  1,000 mg Intravenous Q12H   Continuous Infusions:    . sodium chloride 10 mL (04/10/12 1315)  . lactated ringers Stopped (04/09/12 1749)   PRN Meds:.diphenhydrAMINE, diphenhydrAMINE, naloxone, ondansetron (ZOFRAN) IV, oxyCODONE, oxyCODONE-acetaminophen, potassium chloride, senna-docusate, sodium chloride, traMADol, DISCONTD: sodium chloride, DISCONTD: acetaminophen, DISCONTD: acetaminophen, DISCONTD: albuterol, DISCONTD: HYDROcodone-acetaminophen, DISCONTD: HYDROmorphone, DISCONTD: morphine, DISCONTD: morphine, DISCONTD: ondansetron (ZOFRAN) IV, DISCONTD: ondansetron, DISCONTD: oxyCODONE DISCONTD: sodium chloride, DISCONTD: zolpidem   Objective: Weight change:   Intake/Output Summary (Last 24 hours) at 04/10/12 1636 Last data filed at 04/10/12 1500  Gross per 24 hour  Intake   2100 ml    Output    715 ml  Net   1385 ml   Blood pressure 148/90, pulse 79, temperature 98.6 F (37 C), temperature source Oral, resp. rate 18, height 6\' 2"  (1.88 m), weight 201 lb 4.5 oz (91.3 kg), SpO2 95.00%. Temp:  [98.2 F (36.8 C)-98.6 F (37 C)] 98.6 F (37 C) (04/19 1405) Pulse Rate:  [79-89] 79  (04/19 1405) Resp:  [16-18] 18  (04/19 1405) BP: (142-164)/(89-96) 148/90 mmHg (04/19 1405) SpO2:  [93 %-96 %] 95 % (04/19 1405) FiO2 (%):  [37 %-38 %] 38 % (04/19 0422)  Physical Exam: Gen aox4 HEENT: area of abscess has diminished in size substantially CV: RR no mgr Pulm: decreased bs right side at base, chest tube in place with serosanguinous  material  Lab Results:  [facility] 04/10/12 0605 04/09/12 0605  WBC 11.6* 9.5  HGB 8.5* 9.3*  HCT 25.1* 29.0*  PLT 409* 529*    BMET  Basename 04/09/12 0605 04/08/12 0650  NA 138 138  K 4.0 4.2  CL 103 105  CO2 25 23  GLUCOSE 149* 97  BUN 20 19  CREATININE 1.46* 1.50*  CALCIUM 9.0 8.7    Micro Results: Recent Results (from the past 240 hour(s))  WOUND CULTURE     Status: Normal   Collection Time   04/02/12 12:30 PM      Component Value Range Status Comment   Specimen Description WOUND MOUTH RIGHT   Final    Special Requests ORAL MUCOSA   Final    Gram Stain     Final    Value: FEW WBC PRESENT, PREDOMINANTLY PMN     NO SQUAMOUS EPITHELIAL CELLS SEEN     NO ORGANISMS SEEN   Culture NO GROWTH 2 DAYS   Final    Report Status 04/04/2012 FINAL   Final   BODY FLUID CULTURE     Status: Normal   Collection Time   04/06/12  2:40 PM      Component Value Range Status Comment   Specimen Description FLUID PLEURAL   Final    Special Requests 1 TUBE @ 2.2CC   Final    Gram Stain     Final    Value: MODERATE WBC PRESENT, PREDOMINANTLY PMN     NO ORGANISMS SEEN   Culture NO GROWTH 3 DAYS   Final    Report Status 04/10/2012 FINAL   Final   AFB CULTURE WITH SMEAR     Status: Normal (Preliminary result)   Collection Time   04/09/12  8:48  AM  Component Value Range Status Comment   Specimen Description PLEURAL FLUID RIGHT   Final    Special Requests PT ON ZYVOX FLAGYL VANCO   Final    ACID FAST SMEAR NO ACID FAST BACILLI SEEN   Final    Culture     Final    Value: CULTURE WILL BE EXAMINED FOR 6 WEEKS BEFORE ISSUING A FINAL REPORT   Report Status PENDING   Incomplete   FUNGUS CULTURE W SMEAR     Status: Normal (Preliminary result)   Collection Time   04/09/12  8:48 AM      Component Value Range Status Comment   Specimen Description PLEURAL FLUID RIGHT   Final    Special Requests PT ON ZYVOX FLAGYL VANCO   Final    Fungal Smear NO YEAST OR FUNGAL ELEMENTS SEEN   Final    Culture CULTURE IN PROGRESS FOR FOUR WEEKS   Final    Report Status PENDING   Incomplete   BODY FLUID CULTURE     Status: Normal (Preliminary result)   Collection Time   04/09/12  8:48 AM      Component Value Range Status Comment   Specimen Description PLEURAL FLUID RIGHT   Final    Special Requests PT ON ZYVOX FLAGYL VANCO   Final    Gram Stain     Final    Value: FEW WBC PRESENT,BOTH PMN AND MONONUCLEAR     NO SQUAMOUS EPITHELIAL CELLS SEEN     NO ORGANISMS SEEN   Culture NO GROWTH 1 DAY   Final    Report Status PENDING   Incomplete   TISSUE CULTURE     Status: Normal (Preliminary result)   Collection Time   04/09/12  9:14 AM      Component Value Range Status Comment   Specimen Description TISSUE PLEURAL RIGHT   Final    Special Requests VISCERAL PLEURA NO 1 PT ON ZYVOX FLAGYL VANCO   Final    Gram Stain     Final    Value: NO WBC SEEN     FEW SQUAMOUS EPITHELIAL CELLS PRESENT     NO ORGANISMS SEEN   Culture NO GROWTH 1 DAY   Final    Report Status PENDING   Incomplete   TISSUE CULTURE     Status: Normal (Preliminary result)   Collection Time   04/09/12  9:16 AM      Component Value Range Status Comment   Specimen Description TISSUE PLEURAL RIGHT   Final    Special Requests VISCERAL PLEURA NO 2 PT ON ZYVOX FLAGYL VANCO   Final    Gram  Stain     Final    Value: RARE WBC PRESENT, PREDOMINANTLY PMN     NO SQUAMOUS EPITHELIAL CELLS SEEN     NO ORGANISMS SEEN   Culture NO GROWTH 1 DAY   Final    Report Status PENDING   Incomplete     Studies/Results: Dg Chest Port 1 View  04/10/2012  *RADIOLOGY REPORT*  Clinical Data: 46 year old male with right-side pneumonia status post thoracotomy.  PORTABLE CHEST - 1 VIEW  Comparison: 04/09/2012 and earlier.  Findings: Portable semi upright AP view 0835 hours.  2 right chest tubes remain in place. Stable small right apical pneumothorax. Residual pleural effusion.  Globular opacity projecting over the minor fissure probably represents a pseudo-lesion from fluid in the fissure.  Continued air bronchograms at the right hilum.  Left pleural effusion with dense retrocardiac opacity and  obscuration of the left hemidiaphragm.  Stable cardiac size and mediastinal contours.  Stable right subclavian central line. Visualized tracheal air column is within normal limits.  IMPRESSION: 1.  Two right chest tubes remain in place.  Right hydropneumothorax with small component of gas. Suspect fluid in the right minor fissure (pseudo lesion). 2.  Left pleural effusion.  Lower lobe atelectasis or consolidation.  Original Report Authenticated By: Randall An, M.D.   Dg Chest Portable 1 View  04/09/2012  *RADIOLOGY REPORT*  Clinical Data: Postop post right VATS  Comparison exam 04/07/2012  examination single frontal view portable chest x-ray  Findings:  Cardiomediastinal silhouette is stable.  2 right chest tubes are noted There is decrease in right pleural effusion.  Stable small left pleural effusion.  Stable bilateral basilar atelectasis or infiltrate. Tiny right apical medial pneumothorax.  .  There is right IJ central line with tip in SVC.  IMPRESSION: 2 right chest tubes are noted There is decrease in right pleural effusion.  Stable small left pleural effusion.  Stable bilateral basilar atelectasis or infiltrate.  Tiny right apical medial pneumothorax.  .  There is right IJ central line with tip in SVC.  Original Report Authenticated By: Lahoma Crocker, M.D.      Assessment/Plan: [name] [name] is a 46 y.o. male with recent pneumococcal bacteremia and pneumonia readmitted with apparent healthcare associated pneumonia and a buccal abscess.. He had elevationg in liver fxn tests in particular alk phosph ? He has now had VATS and decortication of right lower lobe drainage of parapneumonic effusion  1) Pulmonary infiltrates with Parapneumonic effusions: RML area with air-bronchograms pt now.  --continue zyvox and add flagyl and would continue for 14 days postoperatively   2) Buccal abscess: Dr. Hoyt Koch thinks this is due to parotid disease which would make MSSA MRSA more likely culprits. Still need to keep in mid odontogenic flora as well. He is well covered with current regimen.  --continue zyvox + flagyl --may need to reimage down the road --he also needs to be seen by a dentist and oral surgeon as oupt  Dr. Baxter Flattery will be covering this weekend and is available for questions.       LOS: 14 days   Alcide Evener 04/10/2012, 4:36 PM

## 2012-04-10 NOTE — Progress Notes (Signed)
Physical Therapy Discharge Note: new order received. Pt just discharged from our services 4/16 secondary to independence and having met all goals. Pt witnessed ambulating entire length of unit 2000 >500' with RW mostly to support chest tube and supervision of RN for IV. Spoke to pt who states no deficits in mobility from prior to VATS. Pt educated that as long as he is connected to O2, CT, and IV will need assist to mobilize but is able to do so with nursing staff assist. Pt continues to not require further acute therapy services at this point. Signing off. Thanks. Elwyn Reach, [place]

## 2012-04-11 LAB — COMPREHENSIVE METABOLIC PANEL
ALT: 42 U/L (ref 0–53)
AST: 37 U/L (ref 0–37)
Albumin: 1.8 g/dL — ABNORMAL LOW (ref 3.5–5.2)
Alkaline Phosphatase: 660 U/L — ABNORMAL HIGH (ref 39–117)
BUN: 24 mg/dL — ABNORMAL HIGH (ref 6–23)
CO2: 23 mEq/L (ref 19–32)
Calcium: 8.1 mg/dL — ABNORMAL LOW (ref 8.4–10.5)
Chloride: 103 mEq/L (ref 96–112)
Creatinine, Ser: 1.45 mg/dL — ABNORMAL HIGH (ref 0.50–1.35)
GFR calc Af Amer: 66 mL/min — ABNORMAL LOW (ref >90)
GFR calc non Af Amer: 57 mL/min — ABNORMAL LOW (ref >90)
Glucose, Bld: 167 mg/dL — ABNORMAL HIGH (ref 70–99)
Potassium: 4.4 mEq/L (ref 3.5–5.1)
Sodium: 132 mEq/L — ABNORMAL LOW (ref 135–145)
Total Bilirubin: 0.3 mg/dL (ref 0.3–1.2)
Total Protein: 6.4 g/dL (ref 6.0–8.3)

## 2012-04-11 LAB — CBC
HCT: 25.1 % — ABNORMAL LOW (ref 39.0–52.0)
Hemoglobin: 8.3 g/dL — ABNORMAL LOW (ref 13.0–17.0)
MCH: 28.8 pg (ref 26.0–34.0)
MCHC: 33.1 g/dL (ref 30.0–36.0)
MCV: 87.2 fL (ref 78.0–100.0)
Platelets: 396 10*3/uL (ref 150–400)
RBC: 2.88 MIL/uL — ABNORMAL LOW (ref 4.22–5.81)
RDW: 14.8 % (ref 11.5–15.5)
WBC: 10.1 10*3/uL (ref 4.0–10.5)

## 2012-04-11 LAB — GLUCOSE, CAPILLARY
Glucose-Capillary: 165 mg/dL — ABNORMAL HIGH (ref 70–99)
Glucose-Capillary: 180 mg/dL — ABNORMAL HIGH (ref 70–99)
Glucose-Capillary: 145 mg/dL — ABNORMAL HIGH (ref 70–99)
Glucose-Capillary: 149 mg/dL — ABNORMAL HIGH (ref 70–99)

## 2012-04-11 MED ORDER — INSULIN GLARGINE 100 UNIT/ML ~~LOC~~ SOLN
8.0000 [IU] | Freq: Every day | SUBCUTANEOUS | Status: DC
  Administered 2012-04-11 – 2012-04-13 (×3): 8 [IU] via SUBCUTANEOUS

## 2012-04-11 NOTE — Progress Notes (Signed)
Kelby Puente CSN:[id number],MRN:[id number] is a 46 y.o. male,  Outpatient Primary MD for the patient is [name], MD, MD  Chief Complaint  Patient presents with  . Emesis        Subjective:    [name] seen doing well.  In good spirits.  Good appettite.  D#2 s/p VATS.  C/o mod pain in R side at area of surgery-PCA off.  Ambulated well with no specific issues.  No N/V.  Mild SOB  Objective:   Filed Vitals:   04/10/12 2047 04/11/12 0417 04/11/12 0736 04/11/12 1048  BP: 163/91 152/86  160/98  Pulse: 81 73  76  Temp: 98.4 F (36.9 C) 98 F (36.7 C)    TempSrc: Oral Oral    Resp: 23 22 18    Height:      Weight:      SpO2: 96% 95% 96%     Wt Readings from Last 3 Encounters:  04/04/12 91.3 kg (201 lb 4.5 oz)  04/04/12 91.3 kg (201 lb 4.5 oz)  04/04/12 91.3 kg (201 lb 4.5 oz)     Intake/Output Summary (Last 24 hours) at 04/11/12 1430 Last data filed at 04/11/12 0600  Gross per 24 hour  Intake    240 ml  Output    780 ml  Net   -540 ml    Exam Awake Alert, Oriented *3, No new F.N deficits, Normal affect [place].AT,PERRAL. Supple Neck,No JVD, No cervical lymphadenopathy appriciated.  Symmetrical Chest wall movement, Good air movement bilaterally-Chest tube in place R side, with drainage, some Egophony to L side of chest. RRR,No Gallops,Rubs or new Murmurs, No Parasternal Heave +ve B.Sounds, Abd Soft, Non tender, No organomegaly appriciated, No rebound -guarding or rigidity. No Cyanosis, Clubbing , 1+edema, No new Rash or bruise    Data Review   CBC  Lab 04/11/12 0655 04/10/12 0605 04/09/12 0605 04/08/12 0650 04/07/12 0620  WBC 10.1 11.6* 9.5 10.0 10.7*  HGB 8.3* 8.5* 9.3* 8.9* 8.6*  HCT 25.1* 25.1* 29.0* 27.0* 26.5*  PLT 396 409* 529* 476* 445*  MCV 87.2 88.4 87.9 85.4 88.0  MCH 28.8 29.9 28.2 28.2 28.6  MCHC 33.1 33.9 32.1 33.0 32.5  RDW 14.8 14.9 14.6 14.4 14.8  LYMPHSABS -- -- -- -- --  MONOABS -- -- -- -- --  EOSABS -- -- -- -- --    BASOSABS -- -- -- -- --  BANDABS -- -- -- -- --    Chemistries   Lab 04/11/12 0655 04/09/12 0605 04/08/12 0650 04/07/12 0620 04/06/12 0655  NA 132* 138 138 136 141  K 4.4 4.0 4.2 4.2 4.6  CL 103 103 105 101 105  CO2 23 25 23 26 29   GLUCOSE 167* 149* 97 171* 71  BUN 24* 20 19 21 22   CREATININE 1.45* 1.46* 1.50* 1.57* 1.66*  CALCIUM 8.1* 9.0 8.7 8.7 8.9  MG -- -- -- -- --  AST 37 31 41* 26 29  ALT 42 46 48 48 61*  ALKPHOS 660* 814* 829* 909* 1010*  BILITOT 0.3 0.3 0.3 0.4 0.5   ------------------------------------------------------------------------------------------------------------------ estimated creatinine clearance is 74.8 ml/min (by C-G formula based on Cr of 1.45). ------------------------------------------------------------------------------------------------------------------ No results found for this basename: HGBA1C:2 in the last 72 hours ------------------------------------------------------------------------------------------------------------------ No results found for this basename: CHOL:2,HDL:2,LDLCALC:2,TRIG:2,CHOLHDL:2,LDLDIRECT:2 in the last 72 hours ------------------------------------------------------------------------------------------------------------------ No results found for this basename: TSH,T4TOTAL,FREET3,T3FREE,THYROIDAB in the last 72 hours ------------------------------------------------------------------------------------------------------------------ No results found for this basename: VITAMINB12:2,FOLATE:2,FERRITIN:2,TIBC:2,IRON:2,RETICCTPCT:2 in the last 72 hours  Coagulation profile  Lab 04/09/12  0605  INR 1.22  PROTIME --    No results found for this basename: DDIMER:2 in the last 72 hours  Cardiac Enzymes No results found for this basename: CK:3,CKMB:3,TROPONINI:3,MYOGLOBIN:3 in the last 168 hours ------------------------------------------------------------------------------------------------------------------ No components found  with this basename: POCBNP:3  Micro Results Recent Results (from the past 240 hour(s))  WOUND CULTURE     Status: Normal   Collection Time   04/02/12 12:30 PM      Component Value Range Status Comment   Specimen Description WOUND MOUTH RIGHT   Final    Special Requests ORAL MUCOSA   Final    Gram Stain     Final    Value: FEW WBC PRESENT, PREDOMINANTLY PMN     NO SQUAMOUS EPITHELIAL CELLS SEEN     NO ORGANISMS SEEN   Culture NO GROWTH 2 DAYS   Final    Report Status 04/04/2012 FINAL   Final   BODY FLUID CULTURE     Status: Normal   Collection Time   04/06/12  2:40 PM      Component Value Range Status Comment   Specimen Description FLUID PLEURAL   Final    Special Requests 1 TUBE @ 2.2CC   Final    Gram Stain     Final    Value: MODERATE WBC PRESENT, PREDOMINANTLY PMN     NO ORGANISMS SEEN   Culture NO GROWTH 3 DAYS   Final    Report Status 04/10/2012 FINAL   Final   AFB CULTURE WITH SMEAR     Status: Normal (Preliminary result)   Collection Time   04/09/12  8:48 AM      Component Value Range Status Comment   Specimen Description PLEURAL FLUID RIGHT   Final    Special Requests PT ON ZYVOX FLAGYL VANCO   Final    ACID FAST SMEAR NO ACID FAST BACILLI SEEN   Final    Culture     Final    Value: CULTURE WILL BE EXAMINED FOR 6 WEEKS BEFORE ISSUING A FINAL REPORT   Report Status PENDING   Incomplete   FUNGUS CULTURE W SMEAR     Status: Normal (Preliminary result)   Collection Time   04/09/12  8:48 AM      Component Value Range Status Comment   Specimen Description PLEURAL FLUID RIGHT   Final    Special Requests PT ON ZYVOX FLAGYL VANCO   Final    Fungal Smear NO YEAST OR FUNGAL ELEMENTS SEEN   Final    Culture CULTURE IN PROGRESS FOR FOUR WEEKS   Final    Report Status PENDING   Incomplete   BODY FLUID CULTURE     Status: Normal (Preliminary result)   Collection Time   04/09/12  8:48 AM      Component Value Range Status Comment   Specimen Description PLEURAL FLUID RIGHT   Final     Special Requests PT ON ZYVOX FLAGYL VANCO   Final    Gram Stain     Final    Value: FEW WBC PRESENT,BOTH PMN AND MONONUCLEAR     NO SQUAMOUS EPITHELIAL CELLS SEEN     NO ORGANISMS SEEN   Culture NO GROWTH 1 DAY   Final    Report Status PENDING   Incomplete   TISSUE CULTURE     Status: Normal (Preliminary result)   Collection Time   04/09/12  9:14 AM      Component Value Range Status Comment  Specimen Description TISSUE PLEURAL RIGHT   Final    Special Requests VISCERAL PLEURA NO 1 PT ON ZYVOX FLAGYL VANCO   Final    Gram Stain     Final    Value: NO WBC SEEN     FEW SQUAMOUS EPITHELIAL CELLS PRESENT     NO ORGANISMS SEEN   Culture NO GROWTH 2 DAYS   Final    Report Status PENDING   Incomplete   TISSUE CULTURE     Status: Normal (Preliminary result)   Collection Time   04/09/12  9:16 AM      Component Value Range Status Comment   Specimen Description TISSUE PLEURAL RIGHT   Final    Special Requests VISCERAL PLEURA NO 2 PT ON ZYVOX FLAGYL VANCO   Final    Gram Stain     Final    Value: RARE WBC PRESENT, PREDOMINANTLY PMN     NO SQUAMOUS EPITHELIAL CELLS SEEN     NO ORGANISMS SEEN   Culture NO GROWTH 2 DAYS   Final    Report Status PENDING   Incomplete     Radiology Reports Dg Chest 2 View  03/28/2012  *RADIOLOGY REPORT*  Clinical Data: Cough, right rib pain, emesis, recent pneumonia  CHEST - 2 VIEW  Comparison: 03/16/2012  Findings: Patchy right lower lobe opacity, suspicious for pneumonia, with associated small-to-moderate right pleural effusion.  Left basilar opacity, atelectasis versus pneumonia, with suspected small left pleural effusion.  No pneumothorax.  The heart is normal in size.  Interval removal of right subclavian PICC.  Visualized osseous structures are within normal limits.  IMPRESSION: Patchy right lower lobe opacity, suspicious for pneumonia, with associated small-to-moderate right pleural effusion.  Left basilar opacity, atelectasis versus pneumonia, with suspected  small left pleural effusion.  Original Report Authenticated By: Julian Hy, M.D.   Dg Chest 2 View  03/16/2012  *RADIOLOGY REPORT*  Clinical Data: History of pneumonia.  CHEST - 2 VIEW  Comparison: 03/11/2012.  Findings: Tip of right PICC terminates in right atrial region.  No pneumothorax is seen.  There is some atelectasis and infiltrate in the medial posterior aspect of the left lung.  There are atelectasis and infiltrative densities seen within the right perihilar region and within the right midlung and right lower lung areas with consolidation and atelectasis.  There is improvement in the amount of infiltrate compared to previous study.   On the lateral image there is continued abnormal opacity inferiorly and posteriorly.  Indistinctness of costophrenic angles may reflect an element of pleural effusion.  IMPRESSION: Right PICC in place.  Some atelectasis and infiltrative density in the medial posterior left lung inferiorly. here are atelectasis and infiltrative densities seen within the right perihilar region and within the right midlung and right lower lung areas with consolidation and atelectasis.  There is improvement in the amount of infiltrative compared to previous study but clearing has not occurred. Question small amounts of pleural effusion.  Original Report Authenticated By: Delane Ginger, M.D.   Dg Chest 2 View  03/11/2012  *RADIOLOGY REPORT*  Clinical Data: Shortness of breath with right lower chest pain and fever.  CHEST - 2 VIEW  Comparison: None.  Findings: Trachea is midline.  Heart size normal.  There is dense airspace consolidation in the right lower lobe.  Question mild left lower lobe air space disease.  No left pleural fluid.  IMPRESSION:  1.  Dense airspace consolidation in the right lower lobe is most consistent with pneumonia.  Follow-up to clearing is recommended, to exclude a centrally obstructing mass. 2.  Question mild left lower lobe air space disease.  Original Report  Authenticated By: Luretha Rued, M.D.   Ct Chest Wo Contrast  03/28/2012  *RADIOLOGY REPORT*  Clinical Data: Cough, right-sided chest pain  CT CHEST WITHOUT CONTRAST  Technique:  Multidetector CT imaging of the chest was performed following the standard protocol without IV contrast.  Comparison: Chest radiographs dated 03/28/2012.  CT chest dated 03/11/2012.  Findings: Patchy posterior right upper lobe opacity and near complete right lower lobe consolidation, suspicious for pneumonia. Associated moderate right pleural effusion.  Patchy left lower lobe opacity, atelectasis versus pneumonia, with associated small left pleural effusion.  No pneumothorax.  Heart is normal in size.  Small pericardial effusion.  Visualized upper abdomen is unremarkable.  Mild degenerative changes of the visualized thoracolumbar spine.  IMPRESSION: Right upper/lower lobe pneumonia, with associated moderate right pleural effusion.  Patchy left lower lobe opacity, atelectasis versus pneumonia, with associated small left pleural effusion.  Original Report Authenticated By: Julian Hy, M.D.   Ct Angio Chest W/cm &/or Wo Cm  03/11/2012  *RADIOLOGY REPORT*  Clinical Data: Fever.  Cough.  Weakness.  Short of breath.  CT ANGIOGRAPHY CHEST  Technique:  Multidetector CT imaging of the chest using the standard protocol during bolus administration of intravenous contrast. Multiplanar reconstructed images including MIPs were obtained and reviewed to evaluate the vascular anatomy.  Contrast: 18mL OMNIPAQUE IOHEXOL 350 MG/ML IV SOLN  Comparison: Chest radiography same day  Findings: Pulmonary arterial opacification is excellent.  There are no pulmonary emboli.  There is consolidative pneumonia throughout the right lower lobe with minor involvement of the right upper lobe.  There is consolidative pneumonia less extensively in the posterior aspect of the left lower lobe.  There is a small amount of pleural fluid on the right.  There is a  tiny amount of pericardial fluid.  There are small mediastinal lymph nodes, likely reactive.  No upper abdominal pathology is seen.  IMPRESSION: No pulmonary emboli.  Consolidative pneumonia throughout the right lower lobe.  Partial involvement of the left lower lobe by consolidative  pneumonia. Minimal involvement of the right upper lobe.  Original Report Authenticated By: Jules Schick, M.D.   Nm Hepatobiliary  03/14/2012  *RADIOLOGY REPORT*  Clinical Data: Abdominal pain, evaluate for cholecystitis  NUCLEAR MEDICINE HEPATOHBILIARY INCLUDE GB  Radiopharmaceutical:  5.5 mCi technetium 54m Choletec  Comparison: Ultrasound dated 03/12/2012.  Findings: Normal hepatic uptake.  Small bowel is visible within 20 minutes.  Gallbladder is visible within 30 minutes, confirming cystic duct patency.  IMPRESSION: Normal hepatobiliary nuclear medicine scan.  Original Report Authenticated By: Julian Hy, M.D.   [place] Chest  03/28/2012  *RADIOLOGY REPORT*  Clinical Data: 46 year old male with pneumonia and pleural effusions on chest CT.  Assess for thoracentesis.  CHEST ULTRASOUND  Comparison: Chest CT without contrast 03/28/2012 and earlier.  Findings: Gray-scale imaging of the right hemithorax demonstrates a small component of pleural fluid, with a larger adjacent component of consolidated lung.  No free fluid seen subjacent to the right hemidiaphragm.  The quantity of pleural fluid identified is insufficient for thoracentesis.  IMPRESSION: Right pleural effusion and larger component of right lung consolidation on ultrasound.  Pleural fluid component quantity insufficient for thoracentesis at this time.  Original Report Authenticated By: Randall An, M.D.   US Abdomen Complete  03/28/2012  *RADIOLOGY REPORT*  Clinical Data:  Elevated LFTs.  Right upper quadrant pain  and nausea.  ABDOMINAL ULTRASOUND COMPLETE  Comparison:  03/12/2012  Findings:  Gallbladder:  No gallstones, gallbladder wall thickening, or  pericholecystic fluid. Negative sonographic Murphy's sign reported by the sonographer.  Common Bile Duct:  Within normal limits in caliber.  Liver: No focal mass lesion identified.  Within normal limits in parenchymal echogenicity.  IVC:  Appears normal.  Pancreas:  No abnormality identified.  Spleen:  Within normal limits in size and echotexture.  Right kidney:  Normal in size and parenchymal echogenicity.  No evidence of mass or hydronephrosis.  Left kidney:  Normal in size and parenchymal echogenicity.  No evidence of mass or hydronephrosis.  Abdominal Aorta:  No aneurysm identified.  Other findings:  Small bilateral pleural effusions.  Small amount of upper abdominal ascites posterior to the liver and spleen.  IMPRESSION:  1.  Small amount of abdominal ascites. 2.  Small pleural effusions.  Original Report Authenticated By: Angelita Ingles, M.D.   US Abdomen Complete  03/12/2012  *RADIOLOGY REPORT*  Clinical Data:  Right upper quadrant abdominal pain, elevated liver function tests  ABDOMINAL ULTRASOUND COMPLETE  Comparison:  Chest CT 03/11/2012 with incomplete visualization of the liver  Findings:  Gallbladder:  Minimal dependent sludge is noted within the gallbladder.  The gallbladder is not distended, with gallbladder wall thickness measuring borderline thickened at 4 mm.  No shadowing calculus or sonographic Murphy's sign noted.  Common Bile Duct:  Within normal limits in caliber.  Liver: No focal mass lesion identified.  Within normal limits in parenchymal echogenicity.  IVC:  Appears normal.  Pancreas:  No abnormality identified.  Spleen:  Within normal limits in size and echotexture.  Right kidney:  Normal in size and parenchymal echogenicity.  No evidence of mass or hydronephrosis.  Left kidney:  Normal in size and parenchymal echogenicity.  No evidence of mass or hydronephrosis.  Abdominal Aorta:  Small amount of ascites is noted.  Trace left pleural effusion.  IMPRESSION: Minimal sludge within the  gallbladder with borderline wall thickening.  This could indicate cholecystitis in the appropriate clinical context.  Trace ascites.  Original Report Authenticated By: Arline Asp, M.D.   Ct Maxillofacial W/cm  04/01/2012  *RADIOLOGY REPORT*  Clinical Data: Swelling on the right for several weeks.  Jaw pain.  CT MAXILLOFACIAL WITH CONTRAST  Technique:  Multidetector CT imaging of the maxillofacial structures was performed with intravenous contrast. Multiplanar CT image reconstructions were also generated.  Contrast: 42mL OMNIPAQUE IOHEXOL 300 MG/ML  SOLN  Comparison: None.  Findings: In the right buccal region, there is a complex fluid collection spanning over 5 x 2.1 x 1.1 cm.  This is immediately adjacent to the significant dental disease.  Mild cervical spondylotic changes.  Visualized mastoid air cells, middle ear cavities and paranasal sinuses are clear.  IMPRESSION: Probable abscess right buccal region most likely dental in origin given the degree of significant caries and periapical lucency of the molars.  Results called to Bari Mantis patient's nurse 04/01/2012 3:20 a.m. with request to call report first in the morning.  Original Report Authenticated By: Doug Sou, M.D.   Dg Chest Port 1 View  03/31/2012  *RADIOLOGY REPORT*  Clinical Data: Assess infiltrate.  PORTABLE CHEST - 1 VIEW  Comparison: 03/28/2012  Findings: Very low lung volumes.  Worsening bibasilar opacities and probable edema.  Bilateral effusions.  Cardiomegaly.  IMPRESSION: Increasing pulmonary edema, bibasilar opacities and effusions. Very low lung volumes.  Original Report Authenticated By: Raelyn Number, M.D.  Echo  - Left ventricle: The cavity size was normal. Wall thickness was increased in a pattern of mild LVH. Systolic function was normal. The estimated ejection fraction was in the range of 55% to 60%. - Atrial septum: No defect or patent foramen ovale was identified. - Pericardium, extracardiac: Small  pericardial effusion with no evidence of tamponade  Scheduled Meds:    . amLODipine  5 mg Oral Daily  . atorvastatin  20 mg Oral q1800  . bisacodyl  10 mg Oral Daily  . ferrous sulfate  325 mg Oral BID WC  . guaiFENesin  1,200 mg Oral BID  . insulin aspart  0-15 Units Subcutaneous TID WC  . insulin aspart  0-5 Units Subcutaneous QHS  . ketorolac  30 mg Intravenous Q6H  . linezolid  600 mg Oral Q12H  . metoprolol tartrate  50 mg Oral BID  . metroNIDAZOLE  500 mg Oral Q8H  . DISCONTD: fentaNYL   Intravenous Q4H   Continuous Infusions:    . sodium chloride 10 mL (04/10/12 1315)  . lactated ringers Stopped (04/09/12 1749)   PRN Meds:.ondansetron (ZOFRAN) IV, oxyCODONE, oxyCODONE-acetaminophen, potassium chloride, senna-docusate, traMADol, DISCONTD: diphenhydrAMINE, DISCONTD: diphenhydrAMINE, DISCONTD: naloxone, DISCONTD: sodium chloride  Assessment & Plan   Brief narrative:  46 year old man recently treated for streptococcus pneumoniae bacteremia and pneumonia , anemia, right-sided facial swelling and elevated liver enzymes along with acute renal failure during his first hospitalization about 2 weeks ago was admitted on 03/12/2012 and discharged on 03/17/2012, patient had received a PICC line during his first admission and was then discharged on IV Rocephin to home as recommended by ID physician Dr. Linus Salmons.  He presented back to the hospital 03/28/2010 with fever and chills, this time he was diagnosed with buccal abscess, along with bilateral pleural effusions with possible right-sided parapneumonic effusion, persistently elevated liver enzymes despite negative hepatitis panel , mild gallbladder sludge on ultrasound but with negative HIDA, he was being seen by GI, pulmonary, ID, renal, hematology.  CT scan of the abdomen and pelvis which was done on April 14 by hematology short moderate to large sized bilateral pleural effusions, patient did have right-sided pneumonia and right-sided  pleuritic chest pain throughout his hospital stay so pulmonary was the cord on the 15th, I do ultrasound-guidance thoracentesis was attempted on the 15th by fibrofatty which he is only 5 cc of fluid brown in color, showing large amount of WBCs and moderate neutrophils, further studies are pending, will wait for pulmonary and ID followup in the future course of action i.e. antibiotics/chest tube depending on that recommendation. I have also discussed with the patient that it is a [facility] continues to require IV antibiotics +/- chest tube placement if deemed necessary by pulmonary.  His antibiotics have been switched around by infectious disease physicians, he continues to have moderate to severe anemia probably anemia of chronic disease along with anemia of acute disease in combination with iron deficiency worsened by frequent lab draws, his iron has been replaced intravenously and now is on oral supplements, has been seen by hematology and is being followed up by them, GI has ordered further immunological testing for elevated transaminases, pulmonary has evaluated him several times for his right-sided parapneumonic effusion however effusion size is too small to be tapped who on ultrasound, He has redeveloped mild non-oliguric acute renal insufficiency again with negative ultrasound, this is likely due to vancomycin and anemia, vancomycin has been stopped patient is  improving post gentle IV fluids. His edema has improved thereafter Lasix has been stopped also. His creatinine is starting to improve gently.  He has elevated LFTs with positive ANA, right upper quadrant ultrasound x2 along with high-dose scan has been negative no clinical signs of acute cholecystitis, patient is being followed by Dr.Mann and he will need to follow with GI upon discharge in about 3 weeks for followup on Serological tests ordered by Dr. Collene Mares, and colonoscopy and EGD biopsy  results.  He also had left I visual field defect i.e. loose body in his left eye for which he was seen by ophthalmologist Dr.Tanner who diagnosed him with Proliferative diabetic retinopathy with macular edema and vitreous hemorrhage and will need continued outpatient IV followup along with aggressive risk factor modification i.e. Diabetes, hypertension dyslipidemia.    Past medical history:  Diabetes mellitus    Consultations done during hospitalization: And followups needed post discharge  Pulmonology -Dr. Lamonte Sakai Gastroenterology -Dr. Collene Mares ENT (no followup needed) Oral Surgery - Dr. Hoyt Koch Hematology - Dr Jamse Arn Tanner-Opthalmology - will need outpatient Retina specialist to followup CTS called for VATS 04/07/2012 PT    Procedures:  April 9: Upper endoscopy: LA grade C esophagitis. Protonix 40 mg by mouth twice a day- Dr Benson [place] April 10: Colonoscopy - Dr Collene Mares April 11: I&D of Rt Parotid Abscess - Dr Hoyt Koch April 18: VATs by Dr. Roxan Hockey   Antibiotics:  April 6: Vancomycin  April 6: Zosyn ABX on April 12th switched to Zyvox-Levaquin-Flagyl      #1. 46 y.o. male admitted on 03/27/2012, with recurrent pneumonia along with pleural effusion concerning for empyema, recent admission for Streptococcus pneumoniae bacteremia and was discharged on IV Rocephin, now comes back with fever chills. ID now changed ABX to Zyvox-Linezolide and Falgyl due to ? Marrow suppression and ARF on 12th, D/W Dr Wendie Agreste, will monitor. Patient continues to have quite a bit of oxygen demand along with exertional shortness of breath, off note CT abdomen done on 04/05/2012 by Haem-Onc shows significantly large amounts of bilateral pleural effusions,, a thoracentesis was attempted on 04/06/2012 which he did 5-10 cc of brown colored fluid, with large amount of WBCs and neutrophils Patient had VATs done 4/18.   Hopefully can determine organism now with cultures and narrow to appropriate meds.  Post-Op CXR showed  decrease in R effusions pleural effusions. Repeat chest x-ray ordered for tomorrow. Surgeon is recommended keeping chest tube until completely no fluid is coming out-130 cc out for tube overnight Fluid cultures show no growth x2 days, fungal smear negative so far, acid-fast bacilli not isolated.  Continue Linezolide, Flagyl  #2.Mixed pattern elevated LFTs: Etiology not clear. Abdominal ultrasound unremarkable on the 6th, hepatitis panel negative and recent HIDA scan was negative. ?? Possibly related to effusion. Has bumped up again on 12th, GI following patient, repeat US shows evidence of gallbladder sludge. Surgery thought no surgical intervention given negative ultrasound, with recent negative HIDA scan, echogram shows mild LV hypertrophy but no frank acute heart failure-trending downwards-likely was related to pneumonia.  #3. Hyponatremia: Stable. Likely related to current pneumonia/pulmonary process. Resolved   #4. Anemia: Likely combination of anemia of chronic disease, iron deficiency, anemia acute disease. No history of bleeding. Fecal occult blood negative. No previous data prior to last hospitalization when he presented with hemoglobin of 10 which quickly decreased, probably from dilution also could be a medication effect. His anemia panel does show severely low iron although ferritin is stable (however it is a acute  phase reactant) . Appreciate gastroenterology evaluation, EGD showing grade C. esophagitis placed on PPI, limited colonoscopy also unremarkable, stable post 1 unit on 11th, ? Marrow suppression from Zosyn + AOAD  and iron deficiency, IV iron being given on recommendation of hematology we'll continue to monitor H&H which is improving, appreciate hematology input.  #5. Diabetes mellitus type 2, uncontrolled, A1c 15: Continue sliding scale insulin, Lantus changed to 8 units at night, meal coverage, due to acute renal insufficiency patient's insulin dose has been reduced on 04/05/2012,  few episodes of borderline hypoglycemia were noted-blood sugars as low with moderate to good control  CBG (last 3)   Basename 04/11/12 1125 04/11/12 0645 04/10/12 2223  GLUCAP 180* 165* 214*   #6. Right buccal mucosa mass: This developed during his last hospitalization. Was seen by ENT and oral surgery, underwent I&D of a right parotid abscess by Dr. Hoyt Koch on 04/02/2012, antibiotics as #1 above.  #7. Acute renal failure with edema - renal failure improved slowly, could have been due to combination of vancomycin and anemia, [place] stable, FeNa 1.37, urine does show mild proteinuria and since patient is diabetic we'll try to place him on ACE. but once creatinine is stable will need outpatient renal follow up. Continue to hold lasix, post gentle IVF, vancomycin stopped 03-03-12.  discussed the case on 04/05/2012 with nephrologist most likely this ATN which was improved in due course of time. Patient likely has underlying CKD due to his diabetes which was an extremely poor control as evidenced by his A1c of 15.7. We'll continue to monitor, trend is improving  #8. Tubular adenoma on colonic polyp biopsy outpatient GI followup.  #9. Left eye 10:00 position sensation of purple colored loose body which per patient started yesterday evening. With no vision loss, no focal neurological deficit, no eye pain or headache. Have discussed it with Dr. Satira Sark patient was seen by him on 04/05/2012 and was diagnosed withProliferative diabetic retinopathy with macular edema and vitreous hemorrhage, patient will need outpatient retina specialist followup along with risk factor modification with strict control of diabetes mellitus type 2, hypertension, dyslipidemia.  #10 Htn-continue amlodipine 5 mg daily, metoprolol 50 mg by mouth twice a day-10 remains elevated will increase Norvasc   DVT Prophylaxis  SCDs Likely may be a [facility]patient wishes to go home   [facility] M.D on 04/11/2012 at 2:30  PM  [place]  [phone]

## 2012-04-11 NOTE — Progress Notes (Addendum)
2 Days Post-Op Procedure(s) (LRB): VIDEO ASSISTED THORACOSCOPY (VATS)/EMPYEMA (Right)  Subjective: Patient has some pain at chest tube sites. Is not using Fentanyl PCA and wants it removed.  Objective: Vital signs in last 24 hours: Patient Vitals for the past 24 hrs:  BP Temp Temp src Pulse Resp SpO2  04/11/12 0736 - - - - 18  96 %  04/11/12 0417 152/86 mmHg 98 F (36.7 C) Oral 73  22  95 %  04/10/12 2047 163/91 mmHg 98.4 F (36.9 C) Oral 81  23  96 %  04/10/12 1951 - - - - 20  95 %  04/10/12 1405 148/90 mmHg 98.6 F (37 C) Oral 79  18  95 %    Current Weight  04/04/12 201 lb 4.5 oz (91.3 kg)       Intake/Output from previous day: 04/19 0701 - 04/20 0700 In: 840 [P.O.:840] Out: 1055 [Urine:925; Chest Tube:130]   Physical Exam:  Cardiovascular: RRR, no murmurs, gallops, or rubs. Pulmonary: Diminished at right base; no rales, wheezes.Rub as CTs in place. Abdomen: Soft, non tender, bowel sounds present. Extremities: Mild bilateral lower extremity edema. Wounds: Clean and dry.  No erythema or signs of infection.  Lab Results: CBC: Basename 04/11/12 0655 04/10/12 0605  WBC 10.1 11.6*  HGB 8.3* 8.5*  HCT 25.1* 25.1*  PLT 396 409*   BMET:  Basename 04/11/12 0655 04/09/12 0605  NA 132* 138  K 4.4 4.0  CL 103 103  CO2 23 25  GLUCOSE 167* 149*  BUN 24* 20  CREATININE 1.45* 1.46*  CALCIUM 8.1* 9.0    PT/INR:  Basename 04/09/12 0605  LABPROT 15.7*  INR 1.22   ABG:  INR: Will add last result for INR, ABG once components are confirmed Will add last 4 CBG results once components are confirmed  Assessment/Plan:  1.  Pulmonary - Encourage incentive spirometer.Chest tube output decreased to 130 cc last 24 hours. There is no air leak.No CXR ordered for today.Will place CT to water seal and check CXR in am. 2.Anemia-H/H this am 8.3/25.1.On Ferrous sulfate. 3.Management per medicine,infectious disease. 4.Remove PCA.    ZIMMERMAN,DONIELLE MPA-C 04/11/2012   I  have seen and examined the patient and agree with the assessment and plan as outlined.  Leave tubes in until completely dry.   OWEN,CLARENCE H 04/11/2012 1:18 PM

## 2012-04-12 ENCOUNTER — Inpatient Hospital Stay (HOSPITAL_COMMUNITY): Payer: Managed Care, Other (non HMO)

## 2012-04-12 LAB — COMPREHENSIVE METABOLIC PANEL
ALT: 43 U/L (ref 0–53)
AST: 37 U/L (ref 0–37)
Albumin: 2.2 g/dL — ABNORMAL LOW (ref 3.5–5.2)
Alkaline Phosphatase: 689 U/L — ABNORMAL HIGH (ref 39–117)
BUN: 21 mg/dL (ref 6–23)
CO2: 27 mEq/L (ref 19–32)
Calcium: 8.6 mg/dL (ref 8.4–10.5)
Chloride: 102 mEq/L (ref 96–112)
Creatinine, Ser: 1.33 mg/dL (ref 0.50–1.35)
GFR calc Af Amer: 73 mL/min — ABNORMAL LOW (ref >90)
GFR calc non Af Amer: 63 mL/min — ABNORMAL LOW (ref >90)
Glucose, Bld: 160 mg/dL — ABNORMAL HIGH (ref 70–99)
Potassium: 4.1 mEq/L (ref 3.5–5.1)
Sodium: 137 mEq/L (ref 135–145)
Total Bilirubin: 0.3 mg/dL (ref 0.3–1.2)
Total Protein: 7.1 g/dL (ref 6.0–8.3)

## 2012-04-12 LAB — TISSUE CULTURE
Culture: NO GROWTH
Culture: NO GROWTH
Gram Stain: NONE SEEN

## 2012-04-12 LAB — BODY FLUID CULTURE: Culture: NO GROWTH

## 2012-04-12 LAB — CBC
HCT: 26.3 % — ABNORMAL LOW (ref 39.0–52.0)
Hemoglobin: 8.7 g/dL — ABNORMAL LOW (ref 13.0–17.0)
MCH: 28.5 pg (ref 26.0–34.0)
MCHC: 33.1 g/dL (ref 30.0–36.0)
MCV: 86.2 fL (ref 78.0–100.0)
Platelets: 491 10*3/uL — ABNORMAL HIGH (ref 150–400)
RBC: 3.05 MIL/uL — ABNORMAL LOW (ref 4.22–5.81)
RDW: 14.7 % (ref 11.5–15.5)
WBC: 9.6 10*3/uL (ref 4.0–10.5)

## 2012-04-12 LAB — GLUCOSE, CAPILLARY
Glucose-Capillary: 151 mg/dL — ABNORMAL HIGH (ref 70–99)
Glucose-Capillary: 124 mg/dL — ABNORMAL HIGH (ref 70–99)
Glucose-Capillary: 128 mg/dL — ABNORMAL HIGH (ref 70–99)
Glucose-Capillary: 145 mg/dL — ABNORMAL HIGH (ref 70–99)

## 2012-04-12 MED ORDER — LISINOPRIL 5 MG PO TABS
5.0000 mg | ORAL_TABLET | Freq: Every day | ORAL | Status: DC
  Administered 2012-04-12 – 2012-04-13 (×2): 5 mg via ORAL
  Filled 2012-04-12 (×3): qty 1

## 2012-04-12 MED ORDER — AMLODIPINE BESYLATE 10 MG PO TABS
10.0000 mg | ORAL_TABLET | Freq: Every day | ORAL | Status: DC
  Administered 2012-04-13: 10 mg via ORAL
  Filled 2012-04-12 (×3): qty 1

## 2012-04-12 NOTE — Progress Notes (Signed)
[name] [name] CSN:[id number],MRN:[id number] is a 46 y.o. male,  Outpatient Primary MD for the patient is [name], MD, MD  Chief Complaint  Patient presents with  . Emesis        Subjective:    [name] [name] seen doing well.  In good spirits.  Good appettite.  D#3 s/p VATS.  C/o mod pain in R side at area of surgery-PCA off.  Ambulated well with no specific issues.  No N/V.   Objective:   Filed Vitals:   04/11/12 2107 04/12/12 0613 04/12/12 0815 04/12/12 1001  BP: 161/89 167/108 167/67 161/92  Pulse: 88 72 78   Temp: 98.1 F (36.7 C) 98 F (36.7 C)    TempSrc: Oral Oral    Resp: 17 18    Height:      Weight:      SpO2: 94% 95%      Wt Readings from Last 3 Encounters:  04/04/12 91.3 kg (201 lb 4.5 oz)  04/04/12 91.3 kg (201 lb 4.5 oz)  04/04/12 91.3 kg (201 lb 4.5 oz)     Intake/Output Summary (Last 24 hours) at 04/12/12 1307 Last data filed at 04/11/12 2000  Gross per 24 hour  Intake      0 ml  Output    100 ml  Net   -100 ml    Exam Awake Alert, Oriented *3, No new F.N deficits, Normal affect [place].AT,PERRAL. Supple Neck,No JVD, No cervical lymphadenopathy appriciated.  Symmetrical Chest wall movement, Good air movement bilaterally-Chest tube in place R side, with drainage, decreased Egophony. RRR,No Gallops,Rubs or new Murmurs, No Parasternal Heave +ve B.Sounds, Abd Soft, Non tender, No organomegaly appriciated, No rebound -guarding or rigidity. No Cyanosis, Clubbing , 1+edema, No new Rash or bruise    Data Review   CBC  Lab 04/12/12 0848 04/11/12 0655 04/10/12 0605 04/09/12 0605 04/08/12 0650  WBC 9.6 10.1 11.6* 9.5 10.0  HGB 8.7* 8.3* 8.5* 9.3* 8.9*  HCT 26.3* 25.1* 25.1* 29.0* 27.0*  PLT 491* 396 409* 529* 476*  MCV 86.2 87.2 88.4 87.9 85.4  MCH 28.5 28.8 29.9 28.2 28.2  MCHC 33.1 33.1 33.9 32.1 33.0  RDW 14.7 14.8 14.9 14.6 14.4  LYMPHSABS -- -- -- -- --  MONOABS -- -- -- -- --  EOSABS -- -- -- -- --  BASOSABS -- -- -- -- --    BANDABS -- -- -- -- --    Chemistries   Lab 04/12/12 0848 04/11/12 0655 04/09/12 [id number] 04/08/12 0650 04/07/12 0620  NA 137 132* 138 138 136  K 4.1 4.4 4.0 4.2 4.2  CL 102 103 103 105 101  CO2 27 23 25 23 26   GLUCOSE 160* 167* 149* 97 171*  BUN 21 24* 20 19 21   CREATININE 1.33 1.45* 1.46* 1.50* 1.57*  CALCIUM 8.6 8.1* 9.0 8.7 8.7  MG -- -- -- -- --  AST 37 37 31 41* 26  ALT 43 42 46 48 48  ALKPHOS 689* 660* 814* 829* 909*  BILITOT 0.3 0.3 0.3 0.3 0.4   ------------------------------------------------------------------------------------------------------------------ estimated creatinine clearance is 81.5 ml/min (by C-G formula based on Cr of 1.33). ------------------------------------------------------------------------------------------------------------------ No results found for this basename: HGBA1C:2 in the last 72 hours ------------------------------------------------------------------------------------------------------------------ No results found for this basename: CHOL:2,HDL:2,LDLCALC:2,TRIG:2,CHOLHDL:2,LDLDIRECT:2 in the last 72 hours ------------------------------------------------------------------------------------------------------------------ No results found for this basename: TSH,T4TOTAL,FREET3,T3FREE,THYROIDAB in the last 72 hours ------------------------------------------------------------------------------------------------------------------ No results found for this basename: VITAMINB12:2,FOLATE:2,FERRITIN:2,TIBC:2,IRON:2,RETICCTPCT:2 in the last 72 hours  Coagulation profile  Lab 04/09/12 0605  INR 1.22  PROTIME --    No results found for this basename: DDIMER:2 in the last 72 hours  Cardiac Enzymes No results found for this basename: CK:3,CKMB:3,TROPONINI:3,MYOGLOBIN:3 in the last 168 hours ------------------------------------------------------------------------------------------------------------------ No components found with this basename:  POCBNP:3  Micro Results Recent Results (from the past 240 hour(s))  BODY FLUID CULTURE     Status: Normal   Collection Time   04/06/12  2:40 PM      Component Value Range Status Comment   Specimen Description FLUID PLEURAL   Final    Special Requests 1 TUBE @ 2.2CC   Final    Gram Stain     Final    Value: MODERATE WBC PRESENT, PREDOMINANTLY PMN     NO ORGANISMS SEEN   Culture NO GROWTH 3 DAYS   Final    Report Status 04/10/2012 FINAL   Final   AFB CULTURE WITH SMEAR     Status: Normal (Preliminary result)   Collection Time   04/09/12  8:48 AM      Component Value Range Status Comment   Specimen Description PLEURAL FLUID RIGHT   Final    Special Requests PT ON ZYVOX FLAGYL VANCO   Final    ACID FAST SMEAR NO ACID FAST BACILLI SEEN   Final    Culture     Final    Value: CULTURE WILL BE EXAMINED FOR 6 WEEKS BEFORE ISSUING A FINAL REPORT   Report Status PENDING   Incomplete   FUNGUS CULTURE W SMEAR     Status: Normal (Preliminary result)   Collection Time   04/09/12  8:48 AM      Component Value Range Status Comment   Specimen Description PLEURAL FLUID RIGHT   Final    Special Requests PT ON ZYVOX FLAGYL VANCO   Final    Fungal Smear NO YEAST OR FUNGAL ELEMENTS SEEN   Final    Culture CULTURE IN PROGRESS FOR FOUR WEEKS   Final    Report Status PENDING   Incomplete   BODY FLUID CULTURE     Status: Normal   Collection Time   04/09/12  8:48 AM      Component Value Range Status Comment   Specimen Description PLEURAL FLUID RIGHT   Final    Special Requests PT ON ZYVOX FLAGYL VANCO   Final    Gram Stain     Final    Value: FEW WBC PRESENT,BOTH PMN AND MONONUCLEAR     NO SQUAMOUS EPITHELIAL CELLS SEEN     NO ORGANISMS SEEN   Culture NO GROWTH 3 DAYS   Final    Report Status 04/12/2012 FINAL   Final   TISSUE CULTURE     Status: Normal   Collection Time   04/09/12  9:14 AM      Component Value Range Status Comment   Specimen Description TISSUE PLEURAL RIGHT   Final    Special  Requests VISCERAL PLEURA NO 1 PT ON ZYVOX FLAGYL VANCO   Final    Gram Stain     Final    Value: NO WBC SEEN     FEW SQUAMOUS EPITHELIAL CELLS PRESENT     NO ORGANISMS SEEN   Culture NO GROWTH 3 DAYS   Final    Report Status 04/12/2012 FINAL   Final   TISSUE CULTURE     Status: Normal   Collection Time   04/09/12  9:16 AM      Component Value Range Status Comment   Specimen Description  TISSUE PLEURAL RIGHT   Final    Special Requests VISCERAL PLEURA NO 2 PT ON ZYVOX FLAGYL VANCO   Final    Gram Stain     Final    Value: RARE WBC PRESENT, PREDOMINANTLY PMN     NO SQUAMOUS EPITHELIAL CELLS SEEN     NO ORGANISMS SEEN   Culture NO GROWTH 3 DAYS   Final    Report Status 04/12/2012 FINAL   Final     Radiology Reports Dg Chest 2 View  03/28/2012  *RADIOLOGY REPORT*  Clinical Data: Cough, right rib pain, emesis, recent pneumonia  CHEST - 2 VIEW  Comparison: 03/16/2012  Findings: Patchy right lower lobe opacity, suspicious for pneumonia, with associated small-to-moderate right pleural effusion.  Left basilar opacity, atelectasis versus pneumonia, with suspected small left pleural effusion.  No pneumothorax.  The heart is normal in size.  Interval removal of right subclavian PICC.  Visualized osseous structures are within normal limits.  IMPRESSION: Patchy right lower lobe opacity, suspicious for pneumonia, with associated small-to-moderate right pleural effusion.  Left basilar opacity, atelectasis versus pneumonia, with suspected small left pleural effusion.  Original Report Authenticated By: Julian Hy, M.D.   Dg Chest 2 View  03/16/2012  *RADIOLOGY REPORT*  Clinical Data: History of pneumonia.  CHEST - 2 VIEW  Comparison: 03/11/2012.  Findings: Tip of right PICC terminates in right atrial region.  No pneumothorax is seen.  There is some atelectasis and infiltrate in the medial posterior aspect of the left lung.  There are atelectasis and infiltrative densities seen within the right perihilar  region and within the right midlung and right lower lung areas with consolidation and atelectasis.  There is improvement in the amount of infiltrate compared to previous study.   On the lateral image there is continued abnormal opacity inferiorly and posteriorly.  Indistinctness of costophrenic angles may reflect an element of pleural effusion.  IMPRESSION: Right PICC in place.  Some atelectasis and infiltrative density in the medial posterior left lung inferiorly. here are atelectasis and infiltrative densities seen within the right perihilar region and within the right midlung and right lower lung areas with consolidation and atelectasis.  There is improvement in the amount of infiltrative compared to previous study but clearing has not occurred. Question small amounts of pleural effusion.  Original Report Authenticated By: Delane Ginger, M.D.   Dg Chest 2 View  03/11/2012  *RADIOLOGY REPORT*  Clinical Data: Shortness of breath with right lower chest pain and fever.  CHEST - 2 VIEW  Comparison: None.  Findings: Trachea is midline.  Heart size normal.  There is dense airspace consolidation in the right lower lobe.  Question mild left lower lobe air space disease.  No left pleural fluid.  IMPRESSION:  1.  Dense airspace consolidation in the right lower lobe is most consistent with pneumonia.  Follow-up to clearing is recommended, to exclude a centrally obstructing mass. 2.  Question mild left lower lobe air space disease.  Original Report Authenticated By: Luretha Rued, M.D.   Ct Chest Wo Contrast  03/28/2012  *RADIOLOGY REPORT*  Clinical Data: Cough, right-sided chest pain  CT CHEST WITHOUT CONTRAST  Technique:  Multidetector CT imaging of the chest was performed following the standard protocol without IV contrast.  Comparison: Chest radiographs dated 03/28/2012.  CT chest dated 03/11/2012.  Findings: Patchy posterior right upper lobe opacity and near complete right lower lobe consolidation, suspicious for  pneumonia. Associated moderate right pleural effusion.  Patchy left lower lobe opacity,  atelectasis versus pneumonia, with associated small left pleural effusion.  No pneumothorax.  Heart is normal in size.  Small pericardial effusion.  Visualized upper abdomen is unremarkable.  Mild degenerative changes of the visualized thoracolumbar spine.  IMPRESSION: Right upper/lower lobe pneumonia, with associated moderate right pleural effusion.  Patchy left lower lobe opacity, atelectasis versus pneumonia, with associated small left pleural effusion.  Original Report Authenticated By: Julian Hy, M.D.   Ct Angio Chest W/cm &/or Wo Cm  03/11/2012  *RADIOLOGY REPORT*  Clinical Data: Fever.  Cough.  Weakness.  Short of breath.  CT ANGIOGRAPHY CHEST  Technique:  Multidetector CT imaging of the chest using the standard protocol during bolus administration of intravenous contrast. Multiplanar reconstructed images including MIPs were obtained and reviewed to evaluate the vascular anatomy.  Contrast: 54mL OMNIPAQUE IOHEXOL 350 MG/ML IV SOLN  Comparison: Chest radiography same day  Findings: Pulmonary arterial opacification is excellent.  There are no pulmonary emboli.  There is consolidative pneumonia throughout the right lower lobe with minor involvement of the right upper lobe.  There is consolidative pneumonia less extensively in the posterior aspect of the left lower lobe.  There is a small amount of pleural fluid on the right.  There is a tiny amount of pericardial fluid.  There are small mediastinal lymph nodes, likely reactive.  No upper abdominal pathology is seen.  IMPRESSION: No pulmonary emboli.  Consolidative pneumonia throughout the right lower lobe.  Partial involvement of the left lower lobe by consolidative  pneumonia. Minimal involvement of the right upper lobe.  Original Report Authenticated By: Jules Schick, M.D.   Nm Hepatobiliary  03/14/2012  *RADIOLOGY REPORT*  Clinical Data: Abdominal pain,  evaluate for cholecystitis  NUCLEAR MEDICINE HEPATOHBILIARY INCLUDE GB  Radiopharmaceutical:  5.5 mCi technetium 4m Choletec  Comparison: Ultrasound dated 03/12/2012.  Findings: Normal hepatic uptake.  Small bowel is visible within 20 minutes.  Gallbladder is visible within 30 minutes, confirming cystic duct patency.  IMPRESSION: Normal hepatobiliary nuclear medicine scan.  Original Report Authenticated By: Julian Hy, M.D.   [place] Chest  03/28/2012  *RADIOLOGY REPORT*  Clinical Data: 46 year old male with pneumonia and pleural effusions on chest CT.  Assess for thoracentesis.  CHEST ULTRASOUND  Comparison: Chest CT without contrast 03/28/2012 and earlier.  Findings: Gray-scale imaging of the right hemithorax demonstrates a small component of pleural fluid, with a larger adjacent component of consolidated lung.  No free fluid seen subjacent to the right hemidiaphragm.  The quantity of pleural fluid identified is insufficient for thoracentesis.  IMPRESSION: Right pleural effusion and larger component of right lung consolidation on ultrasound.  Pleural fluid component quantity insufficient for thoracentesis at this time.  Original Report Authenticated By: Randall An, M.D.   US Abdomen Complete  03/28/2012  *RADIOLOGY REPORT*  Clinical Data:  Elevated LFTs.  Right upper quadrant pain and nausea.  ABDOMINAL ULTRASOUND COMPLETE  Comparison:  03/12/2012  Findings:  Gallbladder:  No gallstones, gallbladder wall thickening, or pericholecystic fluid. Negative sonographic Murphy's sign reported by the sonographer.  Common Bile Duct:  Within normal limits in caliber.  Liver: No focal mass lesion identified.  Within normal limits in parenchymal echogenicity.  IVC:  Appears normal.  Pancreas:  No abnormality identified.  Spleen:  Within normal limits in size and echotexture.  Right kidney:  Normal in size and parenchymal echogenicity.  No evidence of mass or hydronephrosis.  Left kidney:  Normal in size and  parenchymal echogenicity.  No evidence of mass or hydronephrosis.  Abdominal Aorta:  No aneurysm identified.  Other findings:  Small bilateral pleural effusions.  Small amount of upper abdominal ascites posterior to the liver and spleen.  IMPRESSION:  1.  Small amount of abdominal ascites. 2.  Small pleural effusions.  Original Report Authenticated By: Angelita Ingles, M.D.   US Abdomen Complete  03/12/2012  *RADIOLOGY REPORT*  Clinical Data:  Right upper quadrant abdominal pain, elevated liver function tests  ABDOMINAL ULTRASOUND COMPLETE  Comparison:  Chest CT 03/11/2012 with incomplete visualization of the liver  Findings:  Gallbladder:  Minimal dependent sludge is noted within the gallbladder.  The gallbladder is not distended, with gallbladder wall thickness measuring borderline thickened at 4 mm.  No shadowing calculus or sonographic Murphy's sign noted.  Common Bile Duct:  Within normal limits in caliber.  Liver: No focal mass lesion identified.  Within normal limits in parenchymal echogenicity.  IVC:  Appears normal.  Pancreas:  No abnormality identified.  Spleen:  Within normal limits in size and echotexture.  Right kidney:  Normal in size and parenchymal echogenicity.  No evidence of mass or hydronephrosis.  Left kidney:  Normal in size and parenchymal echogenicity.  No evidence of mass or hydronephrosis.  Abdominal Aorta:  Small amount of ascites is noted.  Trace left pleural effusion.  IMPRESSION: Minimal sludge within the gallbladder with borderline wall thickening.  This could indicate cholecystitis in the appropriate clinical context.  Trace ascites.  Original Report Authenticated By: Arline Asp, M.D.   Ct Maxillofacial W/cm  04/01/2012  *RADIOLOGY REPORT*  Clinical Data: Swelling on the right for several weeks.  Jaw pain.  CT MAXILLOFACIAL WITH CONTRAST  Technique:  Multidetector CT imaging of the maxillofacial structures was performed with intravenous contrast. Multiplanar CT image  reconstructions were also generated.  Contrast: 37mL OMNIPAQUE IOHEXOL 300 MG/ML  SOLN  Comparison: None.  Findings: In the right buccal region, there is a complex fluid collection spanning over 5 x 2.1 x 1.1 cm.  This is immediately adjacent to the significant dental disease.  Mild cervical spondylotic changes.  Visualized mastoid air cells, middle ear cavities and paranasal sinuses are clear.  IMPRESSION: Probable abscess right buccal region most likely dental in origin given the degree of significant caries and periapical lucency of the molars.  Results called to Bari Mantis patient's nurse 04/01/2012 3:20 a.m. with request to call report first in the morning.  Original Report Authenticated By: Doug Sou, M.D.   Dg Chest Port 1 View  03/31/2012  *RADIOLOGY REPORT*  Clinical Data: Assess infiltrate.  PORTABLE CHEST - 1 VIEW  Comparison: 03/28/2012  Findings: Very low lung volumes.  Worsening bibasilar opacities and probable edema.  Bilateral effusions.  Cardiomegaly.  IMPRESSION: Increasing pulmonary edema, bibasilar opacities and effusions. Very low lung volumes.  Original Report Authenticated By: Raelyn Number, M.D.    Echo  - Left ventricle: The cavity size was normal. Wall thickness was increased in a pattern of mild LVH. Systolic function was normal. The estimated ejection fraction was in the range of 55% to 60%. - Atrial septum: No defect or patent foramen ovale was identified. - Pericardium, extracardiac: Small pericardial effusion with no evidence of tamponade  Scheduled Meds:    . amLODipine  10 mg Oral Daily  . atorvastatin  20 mg Oral q1800  . bisacodyl  10 mg Oral Daily  . ferrous sulfate  325 mg Oral BID WC  . guaiFENesin  1,200 mg Oral BID  . insulin aspart  0-15  Units Subcutaneous TID WC  . insulin aspart  0-5 Units Subcutaneous QHS  . insulin glargine  8 Units Subcutaneous QHS  . linezolid  600 mg Oral Q12H  . lisinopril  5 mg Oral Daily  . metoprolol tartrate  50  mg Oral BID  . metroNIDAZOLE  500 mg Oral Q8H  . DISCONTD: amLODipine  5 mg Oral Daily   Continuous Infusions:    . sodium chloride 10 mL (04/10/12 1315)  . lactated ringers Stopped (04/09/12 1749)   PRN Meds:.ondansetron (ZOFRAN) IV, oxyCODONE-acetaminophen, potassium chloride, senna-docusate, traMADol  Assessment & Plan   Brief narrative:  46 year old man recently treated for streptococcus pneumoniae bacteremia and pneumonia , anemia, right-sided facial swelling and elevated liver enzymes along with acute renal failure during his first hospitalization about 2 weeks ago was admitted on 03/12/2012 and discharged on 03/17/2012, patient had received a PICC line during his first admission and was then discharged on IV Rocephin to home as recommended by ID physician Dr. Linus Salmons.  He presented back to the hospital 03/28/2010 with fever and chills, this time he was diagnosed with buccal abscess, along with bilateral pleural effusions with possible right-sided parapneumonic effusion, persistently elevated liver enzymes despite negative hepatitis panel , mild gallbladder sludge on ultrasound but with negative HIDA, he was being seen by GI, pulmonary, ID, renal, hematology.  CT scan of the abdomen and pelvis which was done on April 14 by hematology short moderate to large sized bilateral pleural effusions, patient did have right-sided pneumonia and right-sided pleuritic chest pain throughout his hospital stay so pulmonary was the cord on the 15th, I do ultrasound-guidance thoracentesis was attempted on the 15th by fibrofatty which he is only 5 cc of fluid brown in color, showing large amount of WBCs and moderate neutrophils, further studies are pending, will wait for pulmonary and ID followup in the future course of action i.e. antibiotics/chest tube depending on that recommendation. I have also discussed with the patient that it is a [facility]  to require IV antibiotics +/- chest tube placement if deemed necessary by pulmonary.  His antibiotics have been switched around by infectious disease physicians, he continues to have moderate to severe anemia probably anemia of chronic disease along with anemia of acute disease in combination with iron deficiency worsened by frequent lab draws, his iron has been replaced intravenously and now is on oral supplements, has been seen by hematology and is being followed up by them, GI has ordered further immunological testing for elevated transaminases, pulmonary has evaluated him several times for his right-sided parapneumonic effusion however effusion size is too small to be tapped who on ultrasound, He has redeveloped mild non-oliguric acute renal insufficiency again with negative ultrasound, this is likely due to vancomycin and anemia, vancomycin has been stopped patient is improving post gentle IV fluids. His edema has improved thereafter Lasix has been stopped also. His creatinine is starting to improve gently.  He has elevated LFTs with positive ANA, right upper quadrant ultrasound x2 along with high-dose scan has been negative no clinical signs of acute cholecystitis, patient is being followed by Dr.Mann and he will need to follow with GI upon discharge in about 3 weeks for followup on Serological tests ordered by Dr. Collene Mares, and colonoscopy and EGD biopsy results.  He also had left I visual field defect i.e. loose body in his left eye for which he was seen by ophthalmologist Dr.Tanner who diagnosed him with  Proliferative diabetic retinopathy with macular edema and vitreous hemorrhage and will need continued outpatient IV followup along with aggressive risk factor modification i.e. Diabetes, hypertension dyslipidemia.    Past medical history:  Diabetes mellitus    Consultations done during hospitalization: And followups needed post discharge  Pulmonology -Dr. Lamonte Sakai Gastroenterology -Dr. Collene Mares ENT  (no followup needed) Oral Surgery - Dr. Hoyt Koch Hematology - Dr Jamse Arn Tanner-Opthalmology - will need outpatient Retina specialist to followup CTS called for VATS 04/07/2012 PT    Procedures:  April 9: Upper endoscopy: LA grade C esophagitis. Protonix 40 mg by mouth twice a day- Dr Benson [place] April 10: Colonoscopy - Dr Collene Mares April 11: I&D of Rt Parotid Abscess - Dr Hoyt Koch April 18: VATs by Dr. Roxan Hockey   Antibiotics:  April 6: Vancomycin  April 6: Zosyn ABX on April 12th switched to Zyvox-Levaquin-Flagyl      #1. 45 y.o. male admitted on 03/27/2012, with recurrent pneumonia along with pleural effusion concerning for empyema, recent admission for Streptococcus pneumoniae bacteremia and was discharged on IV Rocephin, now comes back with fever chills. ID now changed ABX to Zyvox-Linezolide and Falgyl due to ? Marrow suppression and ARF on 12th, D/W Dr Wendie Agreste, will monitor. Patient continues to have quite a bit of oxygen demand along with exertional shortness of breath, off note CT abdomen done on 04/05/2012 by Haem-Onc shows significantly large amounts of bilateral pleural effusions,, a thoracentesis was attempted on 04/06/2012 which he did 5-10 cc of brown colored fluid, with large amount of WBCs and neutrophils Patient had VATs done 4/18.   Hopefully can determine organism now with cultures and narrow to appropriate meds.  Post-Op CXR showed decrease in R effusions pleural effusions. Repeat chest x-ray ordered for tomorrow. Surgeon is recommended keeping chest tube until completely no fluid is coming out-100cc out for tube overnight Fluid cultures show no growth x3 days, fungal smear negative so far, acid-fast bacilli not isolated.  Continue Linezolide, Flagyl ID has been requested to kindly give duration of therapy and possible oral equivalents for therapy, as might be able to d/c home soon  #2.Mixed pattern elevated LFTs: Etiology not clear. Abdominal ultrasound unremarkable on the 6th,  hepatitis panel negative and recent HIDA scan was negative. ?? Possibly related to effusion. Has bumped up again on 12th, GI following patient, repeat US shows evidence of gallbladder sludge. Surgery thought no surgical intervention given negative ultrasound, with recent negative HIDA scan, echogram shows mild LV hypertrophy but no frank acute heart failure-trending downwards-likely was related to pneumonia.  #3. Hyponatremia: Stable. Likely related to current pneumonia/pulmonary process. Resolved   #4. Anemia: Likely combination of anemia of chronic disease, iron deficiency, anemia acute disease. No history of bleeding. Fecal occult blood negative. No previous data prior to last hospitalization when he presented with hemoglobin of 10 which quickly decreased, probably from dilution also could be a medication effect. His anemia panel does show severely low iron although ferritin is stable (however it is a acute phase reactant) . Appreciate gastroenterology evaluation, EGD showing grade C. esophagitis placed on PPI, limited colonoscopy also unremarkable, stable post 1 unit on 11th, ? Marrow suppression from Zosyn + AOAD  and iron deficiency, IV iron being given on recommendation of hematology we'll continue to monitor H&H which is improving, appreciate hematology input.  #5. Diabetes mellitus type 2, uncontrolled, A1c 15: Continue sliding scale insulin, Lantus changed to 8 units at night, meal coverage, due to acute renal insufficiency patient's insulin dose has been reduced on  04/05/2012, few episodes of borderline hypoglycemia were noted-blood sugars as low with moderate to good control  CBG (last 3)   Basename 04/12/12 1132 04/12/12 0612 04/11/12 2114  GLUCAP 124* 151* 149*   #6. Right buccal mucosa mass: This developed during his last hospitalization. Was seen by ENT and oral surgery, underwent I&D of a right parotid abscess by Dr. Hoyt Koch on 04/02/2012, antibiotics as #1 above.  #7. Acute renal  failure with edema - renal failure improved slowly, could have been due to combination of vancomycin and anemia, [place] stable, FeNa 1.37, urine does show mild proteinuria and since patient is diabetic we'll try to place him on ACE. but once creatinine is stable will need outpatient renal follow up. Continue to hold lasix, post gentle IVF, vancomycin stopped 03-03-12.  discussed the case on 04/05/2012 with nephrologist most likely this ATN which was improved in due course of time. Patient likely has underlying CKD due to his diabetes which was an extremely poor control as evidenced by his A1c of 15.7. We'll continue to monitor, trend is improving and now seems to be at CKD 1 levels  #8. Tubular adenoma on colonic polyp biopsy outpatient GI followup.  #9. Left eye 10:00 position sensation of purple colored loose body which per patient started yesterday evening. With no vision loss, no focal neurological deficit, no eye pain or headache. Have discussed it with Dr. Satira Sark patient was seen by him on 04/05/2012 and was diagnosed withProliferative diabetic retinopathy with macular edema and vitreous hemorrhage, patient will need outpatient retina specialist followup along with risk factor modification with strict control of diabetes mellitus type 2, hypertension, dyslipidemia.  #10 Htn-continue amlodipine 5 mg daily, metoprolol 50 mg by mouth twice a day, Lisniopril 5 mg-Blood pressure remains elevated will increase Norvasc to 10 mg daily--could increase his Lisinopril to 10 mg daily as well   DVT Prophylaxis  SCD[facility] Likely may be a [facility] for [facility]-patient wishes to go home   [facility] M.D on 04/12/2012 at 1:07 PM  [facility]  [phone]

## 2012-04-12 NOTE — Discharge Summary (Signed)
Date of Admission: 03/27/2012 10:34 PM Admitter: @ADMITPROV @   Date of Discharge4/21/2013 Attending Physician: Nita Sells, MD  Things to Follow-up on: Needs to complete full course of ABX Needs close follow-up with ID Needs CBC in 2-4 weeks Needs BMET 2-4 weeks Needs Retina specialist follow-up   [name] [name] 46 year old man recently treated for streptococcus pneumoniae bacteremia and pneumonia , anemia, right-sided facial swelling and elevated liver enzymes along with acute renal failure admitted on 03/12/2012 and discharged on 03/17/2012, patient had received a PICC line during his first admission and was then discharged on IV Rocephin to home as recommended by ID physician Dr. Linus Salmons.   He presented back to the hospital 03/28/2010 with fever and chills, this time he was diagnosed with buccal abscess, along with bilateral pleural effusions with possible right-sided parapneumonic effusion, persistently elevated liver enzymes despite negative hepatitis panel , mild gallbladder sludge on ultrasound but with negative HIDA, he was being seen by GI, pulmonary, ID, renal, hematology  CT scan of the abdomen and pelvis which was done on April 14 by hematology short moderate to large sized bilateral pleural effusions, patient did have right-sided pneumonia and right-sided pleuritic chest pain throughout his hospital stay.  Pulmonary recommended ultrasound-guidance thoracentesis on the 15th by fibrofatty which he is only 5 cc of fluid brown in color.  Ultimately he had a VATS done 4/19 yielding 1.1 liters of fluid, which has been negative so far from Culture standpoint.  His antibiotics have been switched around by infectious disease physicians, and these will be adjusted by them prior to d/c.  Had moderate to severe anemia probably anemia of chronic disease along with anemia of acute disease in combination with iron deficiency worsened by frequent lab draws, his iron  has been replaced intravenously and now is on oral supplements, has been seen by hematology and was followed  GI has ordered further immunological testing for elevated transaminases, and has elevated LFTs with positive ANA, right upper quadrant ultrasound x2 along with high-dose scan has been negative no clinical signs of acute cholecystitis, patient is being followed by Dr.Mann and he will need to follow with GI upon discharge in about 3 weeks for followup on Serological tests ordered by Dr. Collene Mares, and colonoscopy and EGD biopsy results.  He  developed mild non-oliguric acute renal insufficiency again with negative ultrasound, this is likely due to vancomycin and anemia, vancomycin has been stopped patient is improving post gentle IV fluids. His edema has improved thereafter Lasix has been stopped also. His creatinine is starting to improve gently.   He also had left I visual field defect i.e. loose body in his left eye for which he was seen by ophthalmologist Dr.Tanner who diagnosed him with Proliferative diabetic retinopathy with macular edema and vitreous hemorrhage and will need continued outpatient IV followup along with aggressive risk factor modification i.e. Diabetes, hypertension dyslipidemia.  Please see below  Past medical history:  Diabetes mellitus  Consultations done during hospitalization: And followups needed post discharge  Pulmonology -Dr. Lamonte Sakai  Gastroenterology -Dr. Collene Mares  ENT (no followup needed)  Oral Surgery - Dr. Hoyt Koch  Hematology - Dr Jamse Arn  [facility]anner-Opthalmology - will need outpatient Retina specialist to followup  CTS called for VATS 04/07/2012  PT Procedures:  April 9: Upper endoscopy: LA grade C esophagitis. Protonix 40 mg by mouth twice a day- Dr Benson Norway  April 10: Colonoscopy - Dr Collene Mares  April 11: I&D of Rt Parotid Abscess - Dr Hoyt Koch  April 18: VATs  by Dr. Roxan Hockey Antibiotics:  April 6: Vancomycin  April 6: Zosyn  ABX on April 12th switched to  Zyvox-Levaquin-Flagyl-narrowed s/p VATS by ID on d/c to Levaquin and Flagyl for a 14 day course  Hospital Course by problem list:  #1. 46 y.o. male admitted on 03/27/2012, with recurrent pneumonia along with pleural effusion concerning for empyema, recent admission for Streptococcus pneumoniae bacteremia and was discharged on IV Rocephin, now comes back with fever chills. ID now changed ABX to Zyvox-Linezolide and Falgyl due to ? Marrow suppression and ARF on 12th-?2/2 to Vancomycin toxicity, thoracentesis was attempted on 04/06/2012 which he did 5-10 cc of brown colored fluid, with large amount of WBCs and neutrophils  Patient had VATs done 4/18. Maybe can narrow Antibiotics with help of ID. Post-Op CXR showed decrease in R effusions pleural effusions.  Surgeon is rd/c 2 chest tubes., last one 04/14/12 and post-op CXR's have been stable.  Fluid cultures show no growth x3 days, fungal smear negative so far, acid-fast bacilli not isolated. Continue Levofloxacin, Flagyl  Till 04/26/12  #2.Mixed pattern elevated LFTs: Etiology not clear. Abdominal ultrasound unremarkable on the 6th, hepatitis panel negative and recent HIDA scan was negative. ?? Possibly related to effusion. Has bumped up again on 12th, GI following patient, repeat US shows evidence of gallbladder sludge. Surgery thought no surgical intervention given negative ultrasound, ANA was +, but titer was non significant, Anti-smooth muscle Ab neg, Anti-mitochondrial AB neg-His alk phos has persistently trended downwards and might reflect his Pneumonia  #3. Hyponatremia: Stable. Likely related to current pneumonia/pulmonary process. Resolved   #4. Anemia: Likely combination of anemia of chronic disease, iron deficiency, anemia acute disease.  Now stable. No history of bleeding. Fecal occult blood negative.  His anemia panel does show severely low iron although ferritin is stable (however it is a acute phase reactant) . Appreciate gastroenterology  evaluation, EGD showing grade C. esophagitis placed on PPI, limited colonoscopy also unremarkable, stable post 1 unit on 11th, ? Marrow suppression from Zosyn + AOAD and iron deficiency, IV iron given on recommendation of hematology.  Increased Iron to 325 tid.  Will need CBC in 2-4 weeks  #5. Diabetes mellitus type 2, uncontrolled, A1c 15: Continue sliding scale insulin, Lantus changed from 40 units qpm to 8 units at night, meal coverage, due to acute renal insufficiency patient's insulin dose has been reduced on 04/05/2012, few episodes of borderline hypoglycemia were noted-blood sugars as low with moderate to good control  CBG (last 3)   Basename  04/12/12 1132  04/12/12 0612  04/11/12 2114   GLUCAP  124*  151*  149*    #6. Right buccal mucosa mass: This developed during his last hospitalization. Was seen by ENT and oral surgery, underwent I&D of a right parotid abscess by Dr. Hoyt Koch on 04/02/2012, antibiotics as #1 above.   #7. Acute renal failure with edema - renal failure improved slowly, could have been due to combination of vancomycin and anemia, [place] stable, FeNa 1.37, urine does show mild proteinuria and since patient is diabetic we'll try to place him on ACE. but once creatinine is stable will need outpatient renal follow up. Continue to hold lasix, post gentle IVF, vancomycin stopped 04-03-12. discussed the case on 04/05/2012 with nephrologist most likely this ATN which was improved in due course of time. Patient likely has underlying CKD due to his diabetes which was an extremely poor control as evidenced by his A1c of 15.7. We'll continue to monitor, trend is improving and  now seems to be at CKD 1 levels   #8. Tubular adenoma on colonic polyp biopsy outpatient GI followup.   #9. Left eye 10:00 position sensation of purple colored loose body which per patient started yesterday evening. With no vision loss, no focal neurological deficit, no eye pain or headache. Have discussed it with Dr.  Satira Sark patient was seen by him on 04/05/2012 and was diagnosed withProliferative diabetic retinopathy with macular edema and vitreous hemorrhage, patient will need outpatient retina specialist followup along with risk factor modification with strict control of diabetes mellitus type 2, hypertension, dyslipidemia.   #10 Htn-continue amlodipine 5 mg daily, metoprolol 50 mg by mouth twice a day, Lisniopril 5 mg-Blood pressure remains elevatedincreased Norvasc to 10 mg daily-- increased his Lisinopril to 10 mg daily as well    DVT Prophylaxis SCDs  PT cleared the patient for d/c home    Procedures Performed and pertinent labs: Ct Abdomen Pelvis Wo Contrast  04/05/2012  *RADIOLOGY REPORT*  Clinical Data: Abdominal pain, abnormal LFTs  CT ABDOMEN AND PELVIS WITHOUT CONTRAST  Technique:  Multidetector CT imaging of the abdomen and pelvis was performed following the standard protocol without intravenous contrast.  Comparison: Ultrasound 04/03/2012  Findings: There is  bilateral moderate pleural effusions which layer dependently.  There is dense right basilar atelectasis versus pneumonia with air bronchograms.  There is passive atelectasis at the left lung base additionally.  Small pericardial effusion is noted.  Non-IV contrast of the images of the liver are unremarkable.  The gallbladder wall is mildly thickened with mild pericholecystic fluid.  This is similar to comparison ultrasound.  Pancreas, spleen, adrenal glands, and kidneys appear normal.  The stomach, small bowel, appendix, and cecum are normal.  There is moderate volume stool throughout the colon.  The rectosigmoid colon is normal.  Abdominal aorta normal caliber.  No retroperitoneal periportal lymphadenopathy.  No free fluid the pelvis.  The bladder is distended.  No pelvic lymphadenopathy.  Prostate gland is small.  There is small amount of free fluid within the pelvis which has simple fluid attenuation. Review of  bone windows demonstrates no  aggressive osseous lesions.  IMPRESSION:  1.  Large bilateral pleural effusions with associated dense basilar passive atelectasis.  Cannot exclude a component of pneumonia at the lung bases.  Extensive air bronchograms. 2.  Small pericardial effusion. 3.  Mild gallbladder wall thickening and small amount pericholecystic fluid. 4.  Small amount of free fluid in the pelvis may relate to liver dysfunction. 5.  Distended bladder.  Original Report Authenticated By: Suzy Bouchard, M.D.   Dg Orthopantogram  04/01/2012  *RADIOLOGY REPORT*  Clinical Data: 46 year old male with poor dentition, swelling of the right upper jaw and tooth ache.  ORTHOPANTOGRAM/PANORAMIC  Comparison: Face CT 03/31/2012.  Findings: Large dental caries of the left mandibular wisdom tooth again noted.  Periapical lucency at the left maxillary anterior molar better seen on comparison.  No right side maxillary or mandible dentition peri apical lucency.  Small dental caries better seen on comparison (Non occlusal surface posterior right maxillary molar).  Mandible intact.  IMPRESSION: Left greater than right dental caries.  No periapical lucency identified in the area of the right buccal space collection.  Original Report Authenticated By: Randall An, M.D.   Dg Chest 2 View  04/05/2012  *RADIOLOGY REPORT*  Clinical Data: Shortness of breath and chest pain.  CHEST - 2 VIEW  Comparison: 03/31/2012 chest radiograph and 03/28/2012 CT  Findings: Moderate to large bilateral pleural effusions  with lower lung atelectasis again noted. There is no evidence of pneumothorax. Pulmonary vascular congestion has decreased. No other changes are identified.  IMPRESSION: Unchanged moderate to large bilateral pleural effusions and lower lung atelectasis.  Decreased pulmonary vascular congestion.  Original Report Authenticated By: Lura Em, M.D.   Dg Chest 2 View  03/28/2012  *RADIOLOGY REPORT*  Clinical Data: Cough, right rib pain, emesis, recent  pneumonia  CHEST - 2 VIEW  Comparison: 03/16/2012  Findings: Patchy right lower lobe opacity, suspicious for pneumonia, with associated small-to-moderate right pleural effusion.  Left basilar opacity, atelectasis versus pneumonia, with suspected small left pleural effusion.  No pneumothorax.  The heart is normal in size.  Interval removal of right subclavian PICC.  Visualized osseous structures are within normal limits.  IMPRESSION: Patchy right lower lobe opacity, suspicious for pneumonia, with associated small-to-moderate right pleural effusion.  Left basilar opacity, atelectasis versus pneumonia, with suspected small left pleural effusion.  Original Report Authenticated By: Julian Hy, M.D.   Dg Chest 2 View  03/16/2012  *RADIOLOGY REPORT*  Clinical Data: History of pneumonia.  CHEST - 2 VIEW  Comparison: 03/11/2012.  Findings: Tip of right PICC terminates in right atrial region.  No pneumothorax is seen.  There is some atelectasis and infiltrate in the medial posterior aspect of the left lung.  There are atelectasis and infiltrative densities seen within the right perihilar region and within the right midlung and right lower lung areas with consolidation and atelectasis.  There is improvement in the amount of infiltrate compared to previous study.   On the lateral image there is continued abnormal opacity inferiorly and posteriorly.  Indistinctness of costophrenic angles may reflect an element of pleural effusion.  IMPRESSION: Right PICC in place.  Some atelectasis and infiltrative density in the medial posterior left lung inferiorly. here are atelectasis and infiltrative densities seen within the right perihilar region and within the right midlung and right lower lung areas with consolidation and atelectasis.  There is improvement in the amount of infiltrative compared to previous study but clearing has not occurred. Question small amounts of pleural effusion.  Original Report Authenticated By: Delane Ginger, M.D.   Ct Chest Wo Contrast  04/08/2012  *RADIOLOGY REPORT*  Clinical Data: Follow up pleural effusion.  CT CHEST WITHOUT CONTRAST  Technique:  Multidetector CT imaging of the chest was performed following the standard protocol without IV contrast.  Comparison: Chest radiograph performed 04/06/2012, and CT of the chest performed 03/28/2012  Findings: There are persistent small to moderate bilateral pleural effusions, relatively similar in size bilaterally.  There is associated partial consolidation of the left lower lobe, and significant consolidation of the right middle and lower lobes, with associated air bronchograms.  Given the appearance on prior CT, findings are compatible with persistent bilateral pneumonia.  The expanded portions of both lungs appear grossly clear.  There is no definite evidence for interstitial prominence to suggest pulmonary edema.  No pneumothorax is seen status post thoracentesis.  A small pericardial effusion is noted.  The mediastinum is otherwise unremarkable in appearance.  Scattered mediastinal nodes remain borderline normal in size; no mediastinal lymphadenopathy is seen. The great vessels are grossly unremarkable in appearance. The thyroid gland is unremarkable in appearance.  No axillary lymphadenopathy is seen.  The visualized portions of the liver and spleen are unremarkable.  No acute osseous abnormalities are identified.  IMPRESSION:  1.  Persistent small to moderate bilateral pleural effusions, relatively similar in size bilaterally.  Associated partial consolidation  of the left lower lobe, and significant consolidation of the right middle and lower lobes, with air bronchograms. Findings compatible with persistent bilateral pneumonia. 2.  No evidence of pneumothorax; no definite evidence for pulmonary edema. 3.  Small pericardial effusion seen.  Original Report Authenticated By: Santa Lighter, M.D.   Ct Chest Wo Contrast  03/28/2012  *RADIOLOGY REPORT*  Clinical  Data: Cough, right-sided chest pain  CT CHEST WITHOUT CONTRAST  Technique:  Multidetector CT imaging of the chest was performed following the standard protocol without IV contrast.  Comparison: Chest radiographs dated 03/28/2012.  CT chest dated 03/11/2012.  Findings: Patchy posterior right upper lobe opacity and near complete right lower lobe consolidation, suspicious for pneumonia. Associated moderate right pleural effusion.  Patchy left lower lobe opacity, atelectasis versus pneumonia, with associated small left pleural effusion.  No pneumothorax.  Heart is normal in size.  Small pericardial effusion.  Visualized upper abdomen is unremarkable.  Mild degenerative changes of the visualized thoracolumbar spine.  IMPRESSION: Right upper/lower lobe pneumonia, with associated moderate right pleural effusion.  Patchy left lower lobe opacity, atelectasis versus pneumonia, with associated small left pleural effusion.  Original Report Authenticated By: Julian Hy, M.D.   Nm Hepatobiliary  03/14/2012  *RADIOLOGY REPORT*  Clinical Data: Abdominal pain, evaluate for cholecystitis  NUCLEAR MEDICINE HEPATOHBILIARY INCLUDE GB  Radiopharmaceutical:  5.5 mCi technetium 63m Choletec  Comparison: Ultrasound dated 03/12/2012.  Findings: Normal hepatic uptake.  Small bowel is visible within 20 minutes.  Gallbladder is visible within 30 minutes, confirming cystic duct patency.  IMPRESSION: Normal hepatobiliary nuclear medicine scan.  Original Report Authenticated By: Julian Hy, M.D.   [place] Chest  03/28/2012  *RADIOLOGY REPORT*  Clinical Data: 46 year old male with pneumonia and pleural effusions on chest CT.  Assess for thoracentesis.  CHEST ULTRASOUND  Comparison: Chest CT without contrast 03/28/2012 and earlier.  Findings: Gray-scale imaging of the right hemithorax demonstrates a small component of pleural fluid, with a larger adjacent component of consolidated lung.  No free fluid seen subjacent to the right  hemidiaphragm.  The quantity of pleural fluid identified is insufficient for thoracentesis.  IMPRESSION: Right pleural effusion and larger component of right lung consolidation on ultrasound.  Pleural fluid component quantity insufficient for thoracentesis at this time.  Original Report Authenticated By: Randall An, M.D.   US Abdomen Complete  04/03/2012  *RADIOLOGY REPORT*  Clinical Data:  Elevated liver enzymes.  Acute renal failure.  COMPLETE ABDOMINAL ULTRASOUND  Comparison:  HIDA 03/14/2012, ultrasound 03/12/2012  Findings:  Gallbladder:  There is layering sludge within the gallbladder.  The gallbladder wall is upper limits of normal 3 mm.  No pericholecystic fluid.  Negative sonographic Murphy's sign.  Common bile duct:  Normal at 4 mm  Liver:  No focal lesion identified.  Within normal limits in parenchymal echogenicity.  IVC:  Appears normal.  Pancreas:  No focal abnormality seen.  Spleen:  Normal size and echogenicity.  Right Kidney:  12.2cm in length.  No evidence of hydronephrosis or stones.  Left Kidney:  12.9cm in length.  No evidence of hydronephrosis or stones.  Abdominal aorta:  No aneurysm identified.  IMPRESSION:  1.  Small sludge within the gallbladder is similar to comparison exam. 2.  Liver parenchyma appears normal without evidence of duct dilatation.  Of note on the prior HIDA  scan (03/14/2012),  there is a delayed clearance of radiotracer from the liver suggesting cholestasis or hepatitis.  Original Report Authenticated By: Suzy Bouchard, M.D.   US Abdomen  Complete  03/28/2012  *RADIOLOGY REPORT*  Clinical Data:  Elevated LFTs.  Right upper quadrant pain and nausea.  ABDOMINAL ULTRASOUND COMPLETE  Comparison:  03/12/2012  Findings:  Gallbladder:  No gallstones, gallbladder wall thickening, or pericholecystic fluid. Negative sonographic Murphy's sign reported by the sonographer.  Common Bile Duct:  Within normal limits in caliber.  Liver: No focal mass lesion identified.  Within  normal limits in parenchymal echogenicity.  IVC:  Appears normal.  Pancreas:  No abnormality identified.  Spleen:  Within normal limits in size and echotexture.  Right kidney:  Normal in size and parenchymal echogenicity.  No evidence of mass or hydronephrosis.  Left kidney:  Normal in size and parenchymal echogenicity.  No evidence of mass or hydronephrosis.  Abdominal Aorta:  No aneurysm identified.  Other findings:  Small bilateral pleural effusions.  Small amount of upper abdominal ascites posterior to the liver and spleen.  IMPRESSION:  1.  Small amount of abdominal ascites. 2.  Small pleural effusions.  Original Report Authenticated By: Angelita Ingles, M.D.   Ct Maxillofacial W/cm  04/01/2012  *RADIOLOGY REPORT*  Clinical Data: Swelling on the right for several weeks.  Jaw pain.  CT MAXILLOFACIAL WITH CONTRAST  Technique:  Multidetector CT imaging of the maxillofacial structures was performed with intravenous contrast. Multiplanar CT image reconstructions were also generated.  Contrast: 33mL OMNIPAQUE IOHEXOL 300 MG/ML  SOLN  Comparison: None.  Findings: In the right buccal region, there is a complex fluid collection spanning over 5 x 2.1 x 1.1 cm.  This is immediately adjacent to the significant dental disease.  Mild cervical spondylotic changes.  Visualized mastoid air cells, middle ear cavities and paranasal sinuses are clear.  IMPRESSION: Probable abscess right buccal region most likely dental in origin given the degree of significant caries and periapical lucency of the molars.  Results called to Bari Mantis patient's nurse 04/01/2012 3:20 a.m. with request to call report first in the morning.  Original Report Authenticated By: Doug Sou, M.D.   Dg Chest Port 1 View  04/12/2012  *RADIOLOGY REPORT*  Clinical Data: Post right-sided VATS  PORTABLE CHEST - 1 VIEW  Comparison: 04/10/2012; 04/09/2012; chest CT - 04/07/2012  Findings: Grossly unchanged cardiac silhouette and mediastinal contours  given improved inspiratory effort.   Interval removal of right jugular approach central venous catheter.  Unchanged positioning of right-sided chest tubes.  The previously identified tiny right apical pneumothorax is not well depicted on this examination.  Grossly unchanged small bilateral pleural effusions and bibasilar heterogeneous opacities.  Overall improved aeration of the right lung base.  No new focal airspace opacities.  Grossly unchanged bones.  IMPRESSION: 1.  Interval removal of right jugular approaching central venous catheter.  Otherwise, stable position of support apparatus. No definite pneumothorax.  2.  Overall improved inspiration with unchanged small effusions and bibasilar opacities, right greater than left, favored to represent atelectasis.  Original Report Authenticated By: Rachel Moulds, M.D.   Dg Chest Port 1 View  04/10/2012  *RADIOLOGY REPORT*  Clinical Data: 46 year old male with right-side pneumonia status post thoracotomy.  PORTABLE CHEST - 1 VIEW  Comparison: 04/09/2012 and earlier.  Findings: Portable semi upright AP view [id number] hours.  2 right chest tubes remain in place. Stable small right apical pneumothorax. Residual pleural effusion.  Globular opacity projecting over the minor fissure probably represents a pseudo-lesion from fluid in the fissure.  Continued air bronchograms at the right hilum.  Left pleural effusion with dense retrocardiac opacity and  obscuration of the left hemidiaphragm.  Stable cardiac size and mediastinal contours.  Stable right subclavian central line. Visualized tracheal air column is within normal limits.  IMPRESSION: 1.  Two right chest tubes remain in place.  Right hydropneumothorax with small component of gas. Suspect fluid in the right minor fissure (pseudo lesion). 2.  Left pleural effusion.  Lower lobe atelectasis or consolidation.  Original Report Authenticated By: Randall An, M.D.   Dg Chest Portable 1 View  04/09/2012  *RADIOLOGY REPORT*   Clinical Data: Postop post right VATS  Comparison exam 04/07/2012  examination single frontal view portable chest x-ray  Findings:  Cardiomediastinal silhouette is stable.  2 right chest tubes are noted There is decrease in right pleural effusion.  Stable small left pleural effusion.  Stable bilateral basilar atelectasis or infiltrate. Tiny right apical medial pneumothorax.  .  There is right IJ central line with tip in SVC.  IMPRESSION: 2 right chest tubes are noted There is decrease in right pleural effusion.  Stable small left pleural effusion.  Stable bilateral basilar atelectasis or infiltrate. Tiny right apical medial pneumothorax.  .  There is right IJ central line with tip in SVC.  Original Report Authenticated By: Lahoma Crocker, M.D.   Dg Chest Port 1 View  04/06/2012  *RADIOLOGY REPORT*  Clinical Data: Status post right thoracentesis.  Question pneumothorax.  PORTABLE CHEST - 1 VIEW  Comparison: Plain film chest 04/05/2012.  Findings: No pneumothorax is identified after thoracentesis.  Right greater than left pleural effusions persist.  The patient's right effusion appears somewhat decreased.  Bilateral airspace disease appears unchanged.  Heart size normal.  IMPRESSION:  1.  Negative for pneumothorax after thoracentesis. 2.  Some decrease in right effusion.  Left effusion and bilateral airspace disease persist.  Original Report Authenticated By: Arvid Right. Luther Parody, M.D.   Dg Chest Port 1 View  03/31/2012  *RADIOLOGY REPORT*  Clinical Data: Assess infiltrate.  PORTABLE CHEST - 1 VIEW  Comparison: 03/28/2012  Findings: Very low lung volumes.  Worsening bibasilar opacities and probable edema.  Bilateral effusions.  Cardiomegaly.  IMPRESSION: Increasing pulmonary edema, bibasilar opacities and effusions. Very low lung volumes.  Original Report Authenticated By: Raelyn Number, M.D.    Discharge Vitals & PE:  BP 158/95  Pulse 76  Temp(Src) 98.3 F (36.8 C) (Oral)  Resp 18  Ht 6\' 2"  (1.88 m)  Wt 91.3  kg (201 lb 4.5 oz)  BMI 25.84 kg/m2  SpO2 95%  Discharge Labs:  Results for orders placed during the hospital encounter of 03/27/12 (from the past 24 hour(s))  GLUCOSE, CAPILLARY     Status: Abnormal   Collection Time   04/11/12  9:14 PM      Component Value Range   Glucose-Capillary 149 (*) 70 - 99 (mg/dL)  GLUCOSE, CAPILLARY     Status: Abnormal   Collection Time   04/12/12  6:12 AM      Component Value Range   Glucose-Capillary 151 (*) 70 - 99 (mg/dL)  CBC     Status: Abnormal   Collection Time   04/12/12  8:48 AM      Component Value Range   WBC 9.6  4.0 - 10.5 (K/uL)   RBC 3.05 (*) 4.22 - 5.81 (MIL/uL)   Hemoglobin 8.7 (*) 13.0 - 17.0 (g/dL)   HCT 26.3 (*) 39.0 - 52.0 (%)   MCV 86.2  78.0 - 100.0 (fL)   MCH 28.5  26.0 - 34.0 (pg)   MCHC 33.1  30.0 - 36.0 (g/dL)   RDW 14.7  11.5 - 15.5 (%)   Platelets 491 (*) 150 - 400 (K/uL)  COMPREHENSIVE METABOLIC PANEL     Status: Abnormal   Collection Time   04/12/12  8:48 AM      Component Value Range   Sodium 137  135 - 145 (mEq/L)   Potassium 4.1  3.5 - 5.1 (mEq/L)   Chloride 102  96 - 112 (mEq/L)   CO2 27  19 - 32 (mEq/L)   Glucose, Bld 160 (*) 70 - 99 (mg/dL)   BUN 21  6 - 23 (mg/dL)   Creatinine, Ser 1.33  0.50 - 1.35 (mg/dL)   Calcium 8.6  8.4 - 10.5 (mg/dL)   Total Protein 7.1  6.0 - 8.3 (g/dL)   Albumin 2.2 (*) 3.5 - 5.2 (g/dL)   AST 37  0 - 37 (U/L)   ALT 43  0 - 53 (U/L)   Alkaline Phosphatase 689 (*) 39 - 117 (U/L)   Total Bilirubin 0.3  0.3 - 1.2 (mg/dL)   GFR calc non Af Amer 63 (*) >90 (mL/min)   GFR calc Af Amer 73 (*) >90 (mL/min)  GLUCOSE, CAPILLARY     Status: Abnormal   Collection Time   04/12/12 11:32 AM      Component Value Range   Glucose-Capillary 124 (*) 70 - 99 (mg/dL)   Comment 1 Notify RN      Disposition and follow-up:   [name] [name] was discharged from in Stable condition.    Follow-up Appointments:  Follow-up Information    Follow up with Collene Gobble., MD on 04/24/2012.  (Appt at 2:15pm   Arrive at 2:00pm )    Contact information:   [address] [place] [phone]       Follow up with Melrose Nakayama, MD. (04/29/2012 at 1 PM. Please obtain a chest x-ray and [facility] at 12 noon. [facility] is in the same office complex.)    Contact information:   [address] [place] [phone]       Follow up with Alcide Evener, MD. Call on 04/23/2012. (@145pm )    Contact information:   [address] [place] [phone]       Follow up with [name], MD. Schedule an appointment as soon as possible for a visit in 5 days.   Contact information:   [address] [place] [phone]          Discharge Medications: Medication List  As of 04/14/2012  7:44 AM   TAKE these medications         amLODipine 10 MG tablet   Commonly known as: NORVASC   Take 1 tablet (10 mg total) by mouth daily.      aspirin 81 MG tablet   Take 81 mg by mouth every morning.      ferrous sulfate 325 (65 FE) MG tablet   Take 1 tablet (325 mg total) by mouth 3 (three) times daily with meals.      guaiFENesin 600 MG 12 hr tablet   Commonly known as: MUCINEX   Take 2 tablets (1,200 mg total) by mouth 2 (two) times daily.      insulin aspart 100 UNIT/ML injection   Commonly known as: novoLOG   Inject 11 Units into the skin 3 (three) times daily before  meals. Based on sliding scale. Pt took 16 units today at 1600.      insulin glargine 100 UNIT/ML injection   Commonly known as: LANTUS   Inject 8 Units into the skin at bedtime.      levofloxacin 500 MG tablet   Commonly known as: LEVAQUIN   Take 1 tablet (500 mg total) by mouth daily.      lisinopril 5 MG tablet   Commonly known as: PRINIVIL,ZESTRIL   Take 5 mg by mouth every morning.      metoprolol 50 MG  tablet   Commonly known as: LOPRESSOR   Take 1 tablet (50 mg total) by mouth 2 (two) times daily.      metroNIDAZOLE 500 MG tablet   Commonly known as: FLAGYL   Take 1 tablet (500 mg total) by mouth every 8 (eight) hours.      mulitivitamin with minerals Tabs   Take 1 tablet by mouth every morning.      oxyCODONE-acetaminophen 5-325 MG per tablet   Commonly known as: PERCOCET   Take 1-2 tablets by mouth every 4 (four) hours as needed.      rosuvastatin 10 MG tablet   Commonly known as: CRESTOR   Take 10 mg by mouth every morning.      senna-docusate 8.6-50 MG per tablet   Commonly known as: Senokot-S   Take 1 tablet by mouth at bedtime as needed.           Medications Discontinued During This Encounter  Medication Reason  . Multiple Vitamin (MULTIVITAMIN) capsule Entry Error  . insulin aspart (NOVOLOG) 100 UNIT/ML injection   . insulin glargine (LANTUS) 100 UNIT/ML injection   . 0.9 %  sodium chloride infusion   . aspirin tablet 81 mg Formulary change  . insulin aspart (novoLOG) injection 0-9 Units   . heparin injection 5,000 Units   . aspirin chewable tablet 81 mg   . furosemide (LASIX) tablet 20 mg   . lisinopril (PRINIVIL,ZESTRIL) tablet 5 mg   . midazolam (VERSED) injection Patient Discharge  . fentaNYL NICU IV Syringe 50 mcg/mL Patient Discharge  . piperacillin-tazobactam (ZOSYN) IVPB 3.375 g   . polyethylene glycol-electrolytes (NuLYTELY/GoLYTELY) solution 99991111 mL Duplicate  . pantoprazole (PROTONIX) EC tablet 40 mg Change in therapy  . midazolam (VERSED) injection Patient Discharge  . fentaNYL NICU IV Syringe 50 mcg/mL Patient Discharge  . 0.9 %  sodium chloride infusion   . pantoprazole (PROTONIX) EC tablet 40 mg   . insulin aspart (novoLOG) injection 11 Units   . insulin aspart (novoLOG) injection 9 Units   . vancomycin (VANCOCIN) IVPB 1000 mg/200 mL premix Change in therapy  . furosemide (LASIX) tablet 40 mg   . piperacillin-tazobactam (ZOSYN) IVPB 3.375  g   . iron dextran complex (INFED) 1,500 mg in sodium chloride 0.9 % 500 mL IVPB Change in therapy  . iron dextran complex (INFED) 25 mg in sodium chloride 0.9 % 50 mL IVPB Change in therapy  . ferric gluconate (NULECIT) 125 mg in sodium chloride 0.9 % 100 mL IVPB Change in therapy  . insulin aspart (novoLOG) injection 13 Units   . insulin glargine (LANTUS) injection 40 Units   . insulin glargine (LANTUS) injection 35 Units   . insulin aspart (novoLOG) injection 9 Units   . insulin aspart (novoLOG) injection 7 Units   . insulin glargine (LANTUS) injection 30 Units   . levofloxacin (LEVAQUIN) IVPB 750 mg   . albuterol (PROVENTIL) (5 MG/ML) 0.5% nebulizer solution  2.5 mg   . docusate sodium (COLACE) capsule [id number] mg Duplicate  . albuterol (PROVENTIL) (5 MG/ML) 0.5% nebulizer solution 2.5 mg   . 0.9 % irrigation (POUR BTL) Patient Discharge  . ondansetron (ZOFRAN) injection 4 mg Duplicate  . HYDROmorphone (DILAUDID) injection 0000000 mg Duplicate  . morphine 4 MG/ML injection 0000000 mg Duplicate  . HYDROmorphone (DILAUDID) injection 0.25-0.5 mg Patient Transfer  . morphine 4 MG/ML injection 4.56 mg Patient Transfer  . vancomycin (VANCOCIN) IVPB 1000 mg/200 mL premix Entry Error  . 0.9 %  sodium chloride infusion   . acetaminophen (TYLENOL) tablet 650 mg   . acetaminophen (TYLENOL) suppository 650 mg   . albuterol (PROVENTIL) (5 MG/ML) 0.5% nebulizer solution 2.5 mg   . docusate sodium (COLACE) capsule 100 mg   . HYDROcodone-acetaminophen (NORCO) 10-325 MG per tablet 2 tablet   . insulin aspart (novoLOG) injection 0-9 Units   . insulin glargine (LANTUS) injection 20 Units   . lidocaine (LIDODERM) 5 % 2 patch   . morphine 2 MG/ML injection 2 mg   . ondansetron (ZOFRAN) tablet 4 mg   . ondansetron (ZOFRAN) injection 4 mg   . oxyCODONE (Oxy IR/ROXICODONE) immediate release tablet 5 mg   . senna (SENOKOT) tablet 8.6 mg   . sodium chloride 0.9 % injection 3 mL   . sodium chloride 0.9 %  injection 3 mL   . zolpidem (AMBIEN) tablet 5 mg   . metoprolol (LOPRESSOR) tablet 50 mg   . vancomycin (VANCOCIN) IVPB 1000 mg/200 mL premix Discontinued by provider  . insulin aspart (novoLOG) injection 0-24 Units   . fentaNYL 10 mcg/mL PCA injection   . naloxone [facility]) injection 0.4 mg   . sodium chloride 0.9 % injection 9 mL   . diphenhydrAMINE (BENADRYL) injection 12.5 mg   . diphenhydrAMINE (BENADRYL) 12.5 MG/5ML elixir 12.5 mg   . amLODipine (NORVASC) tablet 5 mg     > 60 minutes time spent preparing d/c [name], including direct face-face patient Time, contact with consultants, family and care coordination   Signed: Sakai Heinle,JAI 04/12/2012, 4:20 PM

## 2012-04-12 NOTE — Progress Notes (Signed)
Anterior CT discontinued per protocol. Y site was converted to straight connector so that posterior CT was re-connected and remained to water seal per order. Suture securely knotted so that incision edges approximated. Vaseline occlusive gauze applied. Clean drain gauze applied around remaining CT site. Noted blisters on skin where tape was previously applied. Covered blisters with gauze so that tape would not be re-applied to same site. Pt tolerated well. No SOB or CP noted. Will continue to monitor. [place] [employer]

## 2012-04-12 NOTE — Progress Notes (Addendum)
3 Days Post-Op Procedure(s) (LRB): VIDEO ASSISTED THORACOSCOPY (VATS)/EMPYEMA (Right)  Subjective: Patient without complaints.  Objective: Vital signs in last 24 hours: Patient Vitals for the past 24 hrs:  BP Temp Temp src Pulse Resp SpO2  04/12/12 0815 167/67 mmHg - - 78  - -  04/12/12 0613 167/108 mmHg 98 F (36.7 C) Oral 72  18  95 %  04/11/12 2107 161/89 mmHg 98.1 F (36.7 C) Oral 88  17  94 %  04/11/12 1300 148/82 mmHg 97.7 F (36.5 C) Oral 73  16  96 %  04/11/12 1048 160/98 mmHg - - 76  - -    Current Weight  04/04/12 201 lb 4.5 oz (91.3 kg)       Intake/Output from previous day: 04/20 0701 - 04/21 0700 In: 480 [P.O.:480] Out: 100 [Chest Tube:100]   Physical Exam:  Cardiovascular: RRR, no murmurs, gallops, or rubs. Pulmonary: Diminished at right base; no rales, wheezes.Rub as CTs in place. Abdomen: Soft, non tender, bowel sounds present. Extremities: Mild bilateral lower extremity edema. Wounds: Clean and dry.  No erythema or signs of infection.  Lab Results: CBC:  Basename 04/11/12 0655 04/10/12 0605  WBC 10.1 11.6*  HGB 8.3* 8.5*  HCT 25.1* 25.1*  PLT 396 409*   BMET:   Basename 04/11/12 0655  NA 132*  K 4.4  CL 103  CO2 23  GLUCOSE 167*  BUN 24*  CREATININE 1.45*  CALCIUM 8.1*    PT/INR: No results found for this basename: LABPROT,INR in the last 72 hours ABG:  INR: Will add last result for INR, ABG once components are confirmed Will add last 4 CBG results once components are confirmed  Assessment/Plan:  1.  Pulmonary - Encourage incentive spirometer.Chest tube output decreased to 100 cc last 24 hours. There is no air leak. CXR this am shows np ptx, small/unchanged b/l pleural effusions and atelectasis.Continue CT to water seal. 2.Anemia-H/H this am 8.3/25.1.On Ferrous sulfate. 3.Hypertension-per medicine        Nani Skillern PA-C 04/12/2012 9:02 AM   I have seen and examined the patient and agree with the assessment  and plan as outlined.  Will d/c anterior tube.  Prajna Vanderpool H 04/12/2012 11:53 AM

## 2012-04-13 ENCOUNTER — Inpatient Hospital Stay: Admission: RE | Admit: 2012-04-13 | Payer: Self-pay | Source: Ambulatory Visit | Admitting: Nephrology

## 2012-04-13 ENCOUNTER — Inpatient Hospital Stay (HOSPITAL_COMMUNITY): Payer: Managed Care, Other (non HMO)

## 2012-04-13 DIAGNOSIS — L03211 Cellulitis of face: Secondary | ICD-10-CM

## 2012-04-13 DIAGNOSIS — L0201 Cutaneous abscess of face: Secondary | ICD-10-CM

## 2012-04-13 DIAGNOSIS — J189 Pneumonia, unspecified organism: Secondary | ICD-10-CM

## 2012-04-13 LAB — GLUCOSE, CAPILLARY
Glucose-Capillary: 199 mg/dL — ABNORMAL HIGH (ref 70–99)
Glucose-Capillary: 194 mg/dL — ABNORMAL HIGH (ref 70–99)
Glucose-Capillary: 147 mg/dL — ABNORMAL HIGH (ref 70–99)
Glucose-Capillary: 175 mg/dL — ABNORMAL HIGH (ref 70–99)

## 2012-04-13 MED ORDER — LISINOPRIL 10 MG PO TABS
10.0000 mg | ORAL_TABLET | Freq: Every day | ORAL | Status: DC
  Filled 2012-04-13: qty 1

## 2012-04-13 NOTE — Progress Notes (Signed)
   CARE MANAGEMENT NOTE 04/13/2012  Patient:  [name] [name], [name] [name]   Account Number:  [id number]  Date Initiated:  03/31/2012  Documentation initiated by:  Lizabeth Leyden  Subjective/Objective Assessment:   Patient admitted with cough and chest pain.     Action/Plan:   Progression of care and discharge planning   Anticipated DC Date:  04/02/2012   Anticipated DC Plan:  [place]  CM consult      PAC Choice  [place]   Choice offered to / List presented to:  C-1 Patient   DME arranged  [place]      DME agency  [place] arranged  [place]      [place].   Status of service:  In process, will continue to follow Medicare Important Message given?   (If response is "NO", the following Medicare IM given date fields will be blank) Date Medicare IM given:   Date Additional Medicare IM given:    Discharge Disposition:    Per UR Regulation:    If discussed at Long Length of Stay Meetings, dates discussed:   04/08/2012    Comments:  PCP: Dr Nolon Rod  04/13/12 Cantrell Martus, RN,BSN [place].  PT WILL NEED SHOWER CHAIR AND ? O2. WILL F/U  04/07/12 Lars Pinks, RN, BSN [id number] [place]  04/06/12 Lars Pinks, RN, BSN 1512 PT WAS FOUND TO HAVE BILATERAL PLEURAL EFFUSIONS AND WILL NEED TAPPING, IF PT IS ABLE TO TOLERATE AS HE HAS HAD A FAILED ATTEMPT.  WILL F/U.  04/03/12 Lars Pinks, RN, BSN 1434 UR COMPLETED  04/03/2012 [place], [place] APRIA: will deliver the DME to hospital today.  04/03/2012  [place], [place] Met with patient to discuss discharge plans for home health services.  Reviewed home health agencies, he selected [facility] for Pt and OT. DME: RW and Tub bench [facility]. [facility]/ [facility] referral for PT and OT APRIA/ referral for rollining  walker and tub bench.  04/01/12 Lars Pinks, RN ,BSN 1636 PT HAS A DENTAL ABCESS AND WILL NEED TEETH EXTRACTED.  PT STATED THAT HE LIVES ON THE 3RD FLOOR OF AN APARTMENT BUILDING AND FEELS THAT HE WOULD BENEFIT FROM HH PT AND A TUB BENCH.  PT/OT TO EVAL PT.  WILL F/U ON DC NEEDS.  03/30/2012 [place], [place] Met with patient to discuss discharge planning. CM to continue to follow for discharge needs.

## 2012-04-13 NOTE — Progress Notes (Addendum)
4 Days Post-Op Procedure(s) (LRB): VIDEO ASSISTED THORACOSCOPY (VATS)/EMPYEMA (Right) Subjective:  Mr. Todhunter has no complaints today.  He states he is doing better.    Objective: Vital signs in last 24 hours: Temp:  [97 F (36.1 C)-98.3 F (36.8 C)] 97 F (36.1 C) (04/22 0604) Pulse Rate:  [74-76] 74  (04/22 0604) Cardiac Rhythm:  [-] Normal sinus rhythm (04/22 0933) Resp:  [18] 18  (04/22 0604) BP: (150-169)/(88-99) 169/99 mmHg (04/22 0604) SpO2:  [93 %-95 %] 93 % (04/22 0604) Weight:  [196 lb (88.905 kg)] 196 lb (88.905 kg) (04/22 0500)  Intake/Output from previous day: 04/21 0701 - 04/22 0700 In: 120 [P.O.:120] Out: 22 [Chest Tube:60] Intake/Output this shift: Total I/O In: 240 [P.O.:240] Out: -   General appearance: alert, cooperative and no distress Heart: regular rate and rhythm Lungs: clear to auscultation bilaterally Abdomen: soft, non-tender; bowel sounds normal; no masses,  no organomegaly Wound: clean and dry  Lab Results:  [facility] 04/12/12 0848 04/11/12 0655  WBC 9.6 10.1  HGB 8.7* 8.3*  HCT 26.3* 25.1*  PLT 491* 396   BMET:  Basename 04/12/12 0848 04/11/12 0655  NA 137 132*  K 4.1 4.4  CL 102 103  CO2 27 23  GLUCOSE 160* 167*  BUN 21 24*  CREATININE 1.33 1.45*  CALCIUM 8.6 8.1*    PT/INR: No results found for this basename: LABPROT,INR in the last 72 hours ABG    Component Value Date/Time   PHART 7.427 04/10/2012 0521   HCO3 24.6* 04/10/2012 0521   TCO2 25.7 04/10/2012 0521   O2SAT 94.5 04/10/2012 0521   CBG (last 3)   Basename 04/13/12 1123 04/13/12 0608 04/12/12 2041  GLUCAP 194* 199* 145*    Assessment/Plan: S/P Procedure(s) (LRB): VIDEO ASSISTED THORACOSCOPY (VATS)/EMPYEMA (Right)  2. Pulomonary- anterior chest tube removed yesterday, posterior remains in place with no air leak appreciated, CXR shows new hydropneumothorax on Right, currently on water seal with 60cc of output in last 24 hours 3. Anemia- stable will continue  Ferrous sulfate 4. HTN- internal medicine following 5. Dispo- will d/c chest tube, repeat CXR in AM, if no problems arise will plan for d/c in AM   LOS: 17 days    BARRETT, ERIN 04/13/2012   Patient seen and examined Agree with above

## 2012-04-13 NOTE — Progress Notes (Signed)
[name] [name] CSN:[id number],MRN:[id number] is a 46 y.o. male,  Outpatient Primary MD for the patient is [name], MD, MD  Chief Complaint  Patient presents with  . Emesis        Subjective:    [name] [name] seen doing well.  In good spirits.  D#4 s/p VATS.  C/o mod pain in R side at area of surgery Controlled pain at present.  Ambulated well with no specific issues.  No N/V.   Objective:   Filed Vitals:   04/12/12 2014 04/13/12 0500 04/13/12 0604 04/13/12 1301  BP: 150/88  169/99 152/91  Pulse: 76  74 77  Temp: 98.2 F (36.8 C)  97 F (36.1 C) 97.4 F (36.3 C)  TempSrc: Oral  Oral   Resp: 18  18 18   Height:      Weight:  88.905 kg (196 lb)    SpO2: 94%  93% 99%    Wt Readings from Last 3 Encounters:  04/13/12 88.905 kg (196 lb)  04/13/12 88.905 kg (196 lb)  04/13/12 88.905 kg (196 lb)     Intake/Output Summary (Last 24 hours) at 04/13/12 1424 Last data filed at 04/13/12 1300  Gross per 24 hour  Intake    480 ml  Output     60 ml  Net    420 ml    Exam Awake Alert, Oriented *3, No new F.N deficits, Normal affect [place].AT,PERRAL. Supple Neck,No JVD, No cervical lymphadenopathy appriciated.  Symmetrical Chest wall movement, Good air movement bilaterally-Chest tube in place R side, with drainage, decreased Egophony. RRR,No Gallops,Rubs or new Murmurs, No Parasternal Heave +ve B.Sounds, Abd Soft, Non tender, No organomegaly appriciated, No rebound -guarding or rigidity. No Cyanosis, Clubbing , 1+edema, No new Rash or bruise    Data Review   CBC  Lab 04/12/12 0848 04/11/12 0655 04/10/12 0605 04/09/12 0605 04/08/12 0650  WBC 9.6 10.1 11.6* 9.5 10.0  HGB 8.7* 8.3* 8.5* 9.3* 8.9*  HCT 26.3* 25.1* 25.1* 29.0* 27.0*  PLT 491* 396 409* 529* 476*  MCV 86.2 87.2 88.4 87.9 85.4  MCH 28.5 28.8 29.9 28.2 28.2  MCHC 33.1 33.1 33.9 32.1 33.0  RDW 14.7 14.8 14.9 14.6 14.4  LYMPHSABS -- -- -- -- --  MONOABS -- -- -- -- --  EOSABS -- -- -- -- --  BASOSABS  -- -- -- -- --  BANDABS -- -- -- -- --    Chemistries   Lab 04/12/12 0848 04/11/12 0655 04/09/12 [id number] 04/08/12 0650 04/07/12 0620  NA 137 132* 138 138 136  K 4.1 4.4 4.0 4.2 4.2  CL 102 103 103 105 101  CO2 27 23 25 23 26   GLUCOSE 160* 167* 149* 97 171*  BUN 21 24* 20 19 21   CREATININE 1.33 1.45* 1.46* 1.50* 1.57*  CALCIUM 8.6 8.1* 9.0 8.7 8.7  MG -- -- -- -- --  AST 37 37 31 41* 26  ALT 43 42 46 48 48  ALKPHOS 689* 660* 814* 829* 909*  BILITOT 0.3 0.3 0.3 0.3 0.4   ------------------------------------------------------------------------------------------------------------------ estimated creatinine clearance is 81.5 ml/min (by C-G formula based on Cr of 1.33). ------------------------------------------------------------------------------------------------------------------ No results found for this basename: HGBA1C:2 in the last 72 hours ------------------------------------------------------------------------------------------------------------------ No results found for this basename: CHOL:2,HDL:2,LDLCALC:2,TRIG:2,CHOLHDL:2,LDLDIRECT:2 in the last 72 hours ------------------------------------------------------------------------------------------------------------------ No results found for this basename: TSH,T4TOTAL,FREET3,T3FREE,THYROIDAB in the last 72 hours ------------------------------------------------------------------------------------------------------------------ No results found for this basename: VITAMINB12:2,FOLATE:2,FERRITIN:2,TIBC:2,IRON:2,RETICCTPCT:2 in the last 72 hours  Coagulation profile  Lab 04/09/12 0605  INR 1.22  PROTIME --  No results found for this basename: DDIMER:2 in the last 72 hours  Cardiac Enzymes No results found for this basename: CK:3,CKMB:3,TROPONINI:3,MYOGLOBIN:3 in the last 168 hours ------------------------------------------------------------------------------------------------------------------ No components found with this  basename: POCBNP:3  Micro Results Recent Results (from the past 240 hour(s))  BODY FLUID CULTURE     Status: Normal   Collection Time   04/06/12  2:40 PM      Component Value Range Status Comment   Specimen Description FLUID PLEURAL   Final    Special Requests 1 TUBE @ 2.2CC   Final    Gram Stain     Final    Value: MODERATE WBC PRESENT, PREDOMINANTLY PMN     NO ORGANISMS SEEN   Culture NO GROWTH 3 DAYS   Final    Report Status 04/10/2012 FINAL   Final   AFB CULTURE WITH SMEAR     Status: Normal (Preliminary result)   Collection Time   04/09/12  8:48 AM      Component Value Range Status Comment   Specimen Description PLEURAL FLUID RIGHT   Final    Special Requests PT ON ZYVOX FLAGYL VANCO   Final    ACID FAST SMEAR NO ACID FAST BACILLI SEEN   Final    Culture     Final    Value: CULTURE WILL BE EXAMINED FOR 6 WEEKS BEFORE ISSUING A FINAL REPORT   Report Status PENDING   Incomplete   FUNGUS CULTURE W SMEAR     Status: Normal (Preliminary result)   Collection Time   04/09/12  8:48 AM      Component Value Range Status Comment   Specimen Description PLEURAL FLUID RIGHT   Final    Special Requests PT ON ZYVOX FLAGYL VANCO   Final    Fungal Smear NO YEAST OR FUNGAL ELEMENTS SEEN   Final    Culture CULTURE IN PROGRESS FOR FOUR WEEKS   Final    Report Status PENDING   Incomplete   BODY FLUID CULTURE     Status: Normal   Collection Time   04/09/12  8:48 AM      Component Value Range Status Comment   Specimen Description PLEURAL FLUID RIGHT   Final    Special Requests PT ON ZYVOX FLAGYL VANCO   Final    Gram Stain     Final    Value: FEW WBC PRESENT,BOTH PMN AND MONONUCLEAR     NO SQUAMOUS EPITHELIAL CELLS SEEN     NO ORGANISMS SEEN   Culture NO GROWTH 3 DAYS   Final    Report Status 04/12/2012 FINAL   Final   TISSUE CULTURE     Status: Normal   Collection Time   04/09/12  9:14 AM      Component Value Range Status Comment   Specimen Description TISSUE PLEURAL RIGHT   Final     Special Requests VISCERAL PLEURA NO 1 PT ON ZYVOX FLAGYL VANCO   Final    Gram Stain     Final    Value: NO WBC SEEN     FEW SQUAMOUS EPITHELIAL CELLS PRESENT     NO ORGANISMS SEEN   Culture NO GROWTH 3 DAYS   Final    Report Status 04/12/2012 FINAL   Final   TISSUE CULTURE     Status: Normal   Collection Time   04/09/12  9:16 AM      Component Value Range Status Comment   Specimen Description TISSUE PLEURAL RIGHT  Final    Special Requests VISCERAL PLEURA NO 2 PT ON ZYVOX FLAGYL VANCO   Final    Gram Stain     Final    Value: RARE WBC PRESENT, PREDOMINANTLY PMN     NO SQUAMOUS EPITHELIAL CELLS SEEN     NO ORGANISMS SEEN   Culture NO GROWTH 3 DAYS   Final    Report Status 04/12/2012 FINAL   Final     Radiology Reports Dg Chest 2 View  03/28/2012  *RADIOLOGY REPORT*  Clinical Data: Cough, right rib pain, emesis, recent pneumonia  CHEST - 2 VIEW  Comparison: 03/16/2012  Findings: Patchy right lower lobe opacity, suspicious for pneumonia, with associated small-to-moderate right pleural effusion.  Left basilar opacity, atelectasis versus pneumonia, with suspected small left pleural effusion.  No pneumothorax.  The heart is normal in size.  Interval removal of right subclavian PICC.  Visualized osseous structures are within normal limits.  IMPRESSION: Patchy right lower lobe opacity, suspicious for pneumonia, with associated small-to-moderate right pleural effusion.  Left basilar opacity, atelectasis versus pneumonia, with suspected small left pleural effusion.  Original Report Authenticated By: Julian Hy, M.D.   Dg Chest 2 View  03/16/2012  *RADIOLOGY REPORT*  Clinical Data: History of pneumonia.  CHEST - 2 VIEW  Comparison: 03/11/2012.  Findings: Tip of right PICC terminates in right atrial region.  No pneumothorax is seen.  There is some atelectasis and infiltrate in the medial posterior aspect of the left lung.  There are atelectasis and infiltrative densities seen within the right  perihilar region and within the right midlung and right lower lung areas with consolidation and atelectasis.  There is improvement in the amount of infiltrate compared to previous study.   On the lateral image there is continued abnormal opacity inferiorly and posteriorly.  Indistinctness of costophrenic angles may reflect an element of pleural effusion.  IMPRESSION: Right PICC in place.  Some atelectasis and infiltrative density in the medial posterior left lung inferiorly. here are atelectasis and infiltrative densities seen within the right perihilar region and within the right midlung and right lower lung areas with consolidation and atelectasis.  There is improvement in the amount of infiltrative compared to previous study but clearing has not occurred. Question small amounts of pleural effusion.  Original Report Authenticated By: Delane Ginger, M.D.   Dg Chest 2 View  03/11/2012  *RADIOLOGY REPORT*  Clinical Data: Shortness of breath with right lower chest pain and fever.  CHEST - 2 VIEW  Comparison: None.  Findings: Trachea is midline.  Heart size normal.  There is dense airspace consolidation in the right lower lobe.  Question mild left lower lobe air space disease.  No left pleural fluid.  IMPRESSION:  1.  Dense airspace consolidation in the right lower lobe is most consistent with pneumonia.  Follow-up to clearing is recommended, to exclude a centrally obstructing mass. 2.  Question mild left lower lobe air space disease.  Original Report Authenticated By: Luretha Rued, M.D.   Ct Chest Wo Contrast  03/28/2012  *RADIOLOGY REPORT*  Clinical Data: Cough, right-sided chest pain  CT CHEST WITHOUT CONTRAST  Technique:  Multidetector CT imaging of the chest was performed following the standard protocol without IV contrast.  Comparison: Chest radiographs dated 03/28/2012.  CT chest dated 03/11/2012.  Findings: Patchy posterior right upper lobe opacity and near complete right lower lobe consolidation,  suspicious for pneumonia. Associated moderate right pleural effusion.  Patchy left lower lobe opacity, atelectasis versus pneumonia, with associated  small left pleural effusion.  No pneumothorax.  Heart is normal in size.  Small pericardial effusion.  Visualized upper abdomen is unremarkable.  Mild degenerative changes of the visualized thoracolumbar spine.  IMPRESSION: Right upper/lower lobe pneumonia, with associated moderate right pleural effusion.  Patchy left lower lobe opacity, atelectasis versus pneumonia, with associated small left pleural effusion.  Original Report Authenticated By: Julian Hy, M.D.   Ct Angio Chest W/cm &/or Wo Cm  03/11/2012  *RADIOLOGY REPORT*  Clinical Data: Fever.  Cough.  Weakness.  Short of breath.  CT ANGIOGRAPHY CHEST  Technique:  Multidetector CT imaging of the chest using the standard protocol during bolus administration of intravenous contrast. Multiplanar reconstructed images including MIPs were obtained and reviewed to evaluate the vascular anatomy.  Contrast: 12mL OMNIPAQUE IOHEXOL 350 MG/ML IV SOLN  Comparison: Chest radiography same day  Findings: Pulmonary arterial opacification is excellent.  There are no pulmonary emboli.  There is consolidative pneumonia throughout the right lower lobe with minor involvement of the right upper lobe.  There is consolidative pneumonia less extensively in the posterior aspect of the left lower lobe.  There is a small amount of pleural fluid on the right.  There is a tiny amount of pericardial fluid.  There are small mediastinal lymph nodes, likely reactive.  No upper abdominal pathology is seen.  IMPRESSION: No pulmonary emboli.  Consolidative pneumonia throughout the right lower lobe.  Partial involvement of the left lower lobe by consolidative  pneumonia. Minimal involvement of the right upper lobe.  Original Report Authenticated By: Jules Schick, M.D.   Nm Hepatobiliary  03/14/2012  *RADIOLOGY REPORT*  Clinical Data:  Abdominal pain, evaluate for cholecystitis  NUCLEAR MEDICINE HEPATOHBILIARY INCLUDE GB  Radiopharmaceutical:  5.5 mCi technetium 70m Choletec  Comparison: Ultrasound dated 03/12/2012.  Findings: Normal hepatic uptake.  Small bowel is visible within 20 minutes.  Gallbladder is visible within 30 minutes, confirming cystic duct patency.  IMPRESSION: Normal hepatobiliary nuclear medicine scan.  Original Report Authenticated By: Julian Hy, M.D.   [place] Chest  03/28/2012  *RADIOLOGY REPORT*  Clinical Data: 46 year old male with pneumonia and pleural effusions on chest CT.  Assess for thoracentesis.  CHEST ULTRASOUND  Comparison: Chest CT without contrast 03/28/2012 and earlier.  Findings: Gray-scale imaging of the right hemithorax demonstrates a small component of pleural fluid, with a larger adjacent component of consolidated lung.  No free fluid seen subjacent to the right hemidiaphragm.  The quantity of pleural fluid identified is insufficient for thoracentesis.  IMPRESSION: Right pleural effusion and larger component of right lung consolidation on ultrasound.  Pleural fluid component quantity insufficient for thoracentesis at this time.  Original Report Authenticated By: Randall An, M.D.   US Abdomen Complete  03/28/2012  *RADIOLOGY REPORT*  Clinical Data:  Elevated LFTs.  Right upper quadrant pain and nausea.  ABDOMINAL ULTRASOUND COMPLETE  Comparison:  03/12/2012  Findings:  Gallbladder:  No gallstones, gallbladder wall thickening, or pericholecystic fluid. Negative sonographic Murphy's sign reported by the sonographer.  Common Bile Duct:  Within normal limits in caliber.  Liver: No focal mass lesion identified.  Within normal limits in parenchymal echogenicity.  IVC:  Appears normal.  Pancreas:  No abnormality identified.  Spleen:  Within normal limits in size and echotexture.  Right kidney:  Normal in size and parenchymal echogenicity.  No evidence of mass or hydronephrosis.  Left kidney:  Normal in  size and parenchymal echogenicity.  No evidence of mass or hydronephrosis.  Abdominal Aorta:  No aneurysm  identified.  Other findings:  Small bilateral pleural effusions.  Small amount of upper abdominal ascites posterior to the liver and spleen.  IMPRESSION:  1.  Small amount of abdominal ascites. 2.  Small pleural effusions.  Original Report Authenticated By: Angelita Ingles, M.D.   US Abdomen Complete  03/12/2012  *RADIOLOGY REPORT*  Clinical Data:  Right upper quadrant abdominal pain, elevated liver function tests  ABDOMINAL ULTRASOUND COMPLETE  Comparison:  Chest CT 03/11/2012 with incomplete visualization of the liver  Findings:  Gallbladder:  Minimal dependent sludge is noted within the gallbladder.  The gallbladder is not distended, with gallbladder wall thickness measuring borderline thickened at 4 mm.  No shadowing calculus or sonographic Murphy's sign noted.  Common Bile Duct:  Within normal limits in caliber.  Liver: No focal mass lesion identified.  Within normal limits in parenchymal echogenicity.  IVC:  Appears normal.  Pancreas:  No abnormality identified.  Spleen:  Within normal limits in size and echotexture.  Right kidney:  Normal in size and parenchymal echogenicity.  No evidence of mass or hydronephrosis.  Left kidney:  Normal in size and parenchymal echogenicity.  No evidence of mass or hydronephrosis.  Abdominal Aorta:  Small amount of ascites is noted.  Trace left pleural effusion.  IMPRESSION: Minimal sludge within the gallbladder with borderline wall thickening.  This could indicate cholecystitis in the appropriate clinical context.  Trace ascites.  Original Report Authenticated By: Arline Asp, M.D.   Ct Maxillofacial W/cm  04/01/2012  *RADIOLOGY REPORT*  Clinical Data: Swelling on the right for several weeks.  Jaw pain.  CT MAXILLOFACIAL WITH CONTRAST  Technique:  Multidetector CT imaging of the maxillofacial structures was performed with intravenous contrast. Multiplanar CT  image reconstructions were also generated.  Contrast: 34mL OMNIPAQUE IOHEXOL 300 MG/ML  SOLN  Comparison: None.  Findings: In the right buccal region, there is a complex fluid collection spanning over 5 x 2.1 x 1.1 cm.  This is immediately adjacent to the significant dental disease.  Mild cervical spondylotic changes.  Visualized mastoid air cells, middle ear cavities and paranasal sinuses are clear.  IMPRESSION: Probable abscess right buccal region most likely dental in origin given the degree of significant caries and periapical lucency of the molars.  Results called to Bari Mantis patient's nurse 04/01/2012 3:20 a.m. with request to call report first in the morning.  Original Report Authenticated By: Doug Sou, M.D.   Dg Chest Port 1 View  03/31/2012  *RADIOLOGY REPORT*  Clinical Data: Assess infiltrate.  PORTABLE CHEST - 1 VIEW  Comparison: 03/28/2012  Findings: Very low lung volumes.  Worsening bibasilar opacities and probable edema.  Bilateral effusions.  Cardiomegaly.  IMPRESSION: Increasing pulmonary edema, bibasilar opacities and effusions. Very low lung volumes.  Original Report Authenticated By: Raelyn Number, M.D.    Echo  - Left ventricle: The cavity size was normal. Wall thickness was increased in a pattern of mild LVH. Systolic function was normal. The estimated ejection fraction was in the range of 55% to 60%. - Atrial septum: No defect or patent foramen ovale was identified. - Pericardium, extracardiac: Small pericardial effusion with no evidence of tamponade  Scheduled Meds:    . amLODipine  10 mg Oral Daily  . atorvastatin  20 mg Oral q1800  . bisacodyl  10 mg Oral Daily  . ferrous sulfate  325 mg Oral BID WC  . guaiFENesin  1,200 mg Oral BID  . insulin aspart  0-15 Units Subcutaneous TID WC  .  insulin aspart  0-5 Units Subcutaneous QHS  . insulin glargine  8 Units Subcutaneous QHS  . linezolid  600 mg Oral Q12H  . lisinopril  5 mg Oral Daily  . metoprolol  tartrate  50 mg Oral BID  . metroNIDAZOLE  500 mg Oral Q8H   Continuous Infusions:    . sodium chloride 10 mL (04/10/12 1315)  . lactated ringers Stopped (04/09/12 1749)   PRN Meds:.ondansetron (ZOFRAN) IV, oxyCODONE-acetaminophen, potassium chloride, senna-docusate, traMADol  Assessment & Plan   Brief narrative:  46 year old man recently treated for streptococcus pneumoniae bacteremia and pneumonia , anemia, right-sided facial swelling and elevated liver enzymes along with acute renal failure during his first hospitalization about 2 weeks ago was admitted on 03/12/2012 and discharged on 03/17/2012, patient had received a PICC line during his first admission and was then discharged on IV Rocephin to home as recommended by ID physician Dr. Linus Salmons.  He presented back to the hospital 03/28/2010 with fever and chills, this time he was diagnosed with buccal abscess, along with bilateral pleural effusions with possible right-sided parapneumonic effusion, persistently elevated liver enzymes despite negative hepatitis panel , mild gallbladder sludge on ultrasound but with negative HIDA, he was being seen by GI, pulmonary, ID, renal, hematology.  CT scan of the abdomen and pelvis which was done on April 14 by hematology short moderate to large sized bilateral pleural effusions, patient did have right-sided pneumonia and right-sided pleuritic chest pain throughout his hospital stay so pulmonary was the cord on the 15th, I do ultrasound-guidance thoracentesis was attempted on the 15th by fibrofatty which he is only 5 cc of fluid brown in color, showing large amount of WBCs and moderate neutrophils, further studies are pending, will wait for pulmonary and ID followup in the future course of action i.e. antibiotics/chest tube depending on that recommendation. I have also discussed with the patient that it is a [facility] continues to require IV antibiotics  +/- chest tube placement if deemed necessary by pulmonary.  His antibiotics have been switched around by infectious disease physicians, he continues to have moderate to severe anemia probably anemia of chronic disease along with anemia of acute disease in combination with iron deficiency worsened by frequent lab draws, his iron has been replaced intravenously and now is on oral supplements, has been seen by hematology and is being followed up by them, GI has ordered further immunological testing for elevated transaminases, pulmonary has evaluated him several times for his right-sided parapneumonic effusion however effusion size is too small to be tapped who on ultrasound, He has redeveloped mild non-oliguric acute renal insufficiency again with negative ultrasound, this is likely due to vancomycin and anemia, vancomycin has been stopped patient is improving post gentle IV fluids. His edema has improved thereafter Lasix has been stopped also. His creatinine is starting to improve gently.  He has elevated LFTs with positive ANA, right upper quadrant ultrasound x2 along with high-dose scan has been negative no clinical signs of acute cholecystitis, patient is being followed by Dr.Mann and he will need to follow with GI upon discharge in about 3 weeks for followup on Serological tests ordered by Dr. Collene Mares, and colonoscopy and EGD biopsy results.  He also had left I visual field defect i.e. loose body in his left eye for which he was seen by ophthalmologist Dr.Tanner who diagnosed him with Proliferative diabetic retinopathy with macular edema and vitreous hemorrhage and will need continued outpatient IV  followup along with aggressive risk factor modification i.e. Diabetes, hypertension dyslipidemia.    Past medical history:  Diabetes mellitus    Consultations done during hospitalization: And followups needed post discharge  Pulmonology -Dr. Lamonte Sakai Gastroenterology -Dr. Collene Mares ENT (no followup needed) Oral  Surgery - Dr. Hoyt Koch Hematology - Dr Jamse Arn Tanner-Opthalmology - will need outpatient Retina specialist to followup CTS called for VATS 04/07/2012 PT    Procedures:  April 9: Upper endoscopy: LA grade C esophagitis. Protonix 40 mg by mouth twice a day- Dr Benson [place] April 10: Colonoscopy - Dr Collene Mares April 11: I&D of Rt Parotid Abscess - Dr Hoyt Koch April 18: VATs by Dr. Roxan Hockey   Antibiotics:  April 6: Vancomycin  April 6: Zosyn ABX on April 12th switched to Levaquin-Flagyl   #1. 46 y.o. male admitted on 03/27/2012, with recurrent pneumonia along with pleural effusion concerning for empyema, recent admission for Streptococcus pneumoniae bacteremia and was discharged on IV Rocephin, now comes back with fever chills. ID now changed ABX to Zyvox-Linezolide and Falgyl due to ? Marrow suppression and ARF on 12th, D/W Dr Wendie Agreste, will monitor. Patient continues to have quite a bit of oxygen demand along with exertional shortness of breath, off note CT abdomen done on 04/05/2012 by Haem-Onc shows significantly large amounts of bilateral pleural effusions,, a thoracentesis was attempted on 04/06/2012 which he did 5-10 cc of brown colored fluid, with large amount of WBCs and neutrophils Patient had VATs done 4/18.   Hopefully can determine organism now with cultures and narrow to appropriate meds.  Post-Op CXR showed decrease in R effusions pleural effusions. Repeat chest x-ray ordered for tomorrow. Surgeon is recommended keeping chest tube until completely no fluid is coming out-60cc out for tube overnight Has recommended that he could possibly be d/c tomorrow am Fluid cultures show no growth x3 days, fungal smear negative so far, acid-fast bacilli not isolated.  Continue Linezolide, Flagyl ID has recommended 2 weeks more of by mouth antibiotics with Nasalide and Flagyl or Levaquin and Flagyl  #2.Mixed pattern elevated LFTs: Etiology not clear. Abdominal ultrasound unremarkable on the 6th, hepatitis  panel negative and recent HIDA scan was negative. ?? Possibly related to effusion. Has bumped up again on 12th, GI following patient, repeat US shows evidence of gallbladder sludge. Surgery thought no surgical intervention given negative ultrasound, with recent negative HIDA scan, echogram shows mild LV hypertrophy but no frank acute heart failure-trending downwards-likely was related to pneumonia.  #3. Hyponatremia: Stable. Likely related to current pneumonia/pulmonary process. Resolved   #4. Anemia: Likely combination of anemia of chronic disease, iron deficiency, anemia acute disease. No history of bleeding. Fecal occult blood negative. No previous data prior to last hospitalization when he presented with hemoglobin of 10 which quickly decreased, probably from dilution also could be a medication effect. His anemia panel does show severely low iron although ferritin is stable (however it is a acute phase reactant) . Appreciate gastroenterology evaluation, EGD showing grade C. esophagitis placed on PPI, limited colonoscopy also unremarkable, stable post 1 unit on 11th, ? Marrow suppression from Zosyn + AOAD  and iron deficiency, IV iron being given on recommendation of hematology we'll continue to monitor H&H which is improving, appreciate hematology input.  #5. Diabetes mellitus type 2, uncontrolled, A1c 15: Continue sliding scale insulin, Lantus changed to 8 units at night, meal coverage, due to acute renal insufficiency patient's insulin dose has been reduced on 04/05/2012, few episodes of borderline hypoglycemia were noted-blood sugars as low with moderate to  good control  CBG (last 3)   Basename 04/13/12 1123 04/13/12 0608 04/12/12 2041  GLUCAP 194* 199* 145*   #6. Right buccal mucosa mass: This developed during his last hospitalization. Was seen by ENT and oral surgery, underwent I&D of a right parotid abscess by Dr. Hoyt Koch on 04/02/2012, antibiotics as #1 above.  #7. Acute renal failure with  edema - renal failure improved slowly, could have been due to combination of vancomycin and anemia, [place] stable, FeNa 1.37, urine does show mild proteinuria and since patient is diabetic we'll try to place him on ACE. but once creatinine is stable will need outpatient renal follow up. Continue to hold lasix, post gentle IVF, vancomycin stopped 03-03-12.  discussed the case on 04/05/2012 with nephrologist most likely this ATN which was improved in due course of time. Patient likely has underlying CKD due to his diabetes which was an extremely poor control as evidenced by his A1c of 15.7. We'll continue to monitor, trend is improving and now seems to be at CKD 1 levels  #8. Tubular adenoma on colonic polyp biopsy outpatient GI followup.  #9. Left eye 10:00 position sensation of purple colored loose body which per patient started yesterday evening. With no vision loss, no focal neurological deficit, no eye pain or headache. Have discussed it with Dr. Satira Sark patient was seen by him on 04/05/2012 and was diagnosed withProliferative diabetic retinopathy with macular edema and vitreous hemorrhage, patient will need outpatient retina specialist followup along with risk factor modification with strict control of diabetes mellitus type 2, hypertension, dyslipidemia.  #10 Htn-continue amlodipine 5 mg daily, metoprolol 50 mg by mouth twice a day, Lisniopril 5 mg-Blood pressure remains elevated will increase Norvasc to 10 mg daily--could increase his Lisinopril to 10 mg daily as well   DVT Prophylaxis  SCDs Likely may be a candidate for [facility]-patient wishes to go home   [facility] M.D on 04/13/2012 at 2:24 PM  [facility] Office  [phone]

## 2012-04-13 NOTE — Progress Notes (Signed)
Subjective: Pt tired, chest tube is out  Antibiotics:  Anti-infectives     Start     Dose/Rate Route Frequency Ordered Stop   04/09/12 1830   vancomycin (VANCOCIN) IVPB 1000 mg/200 mL premix  Status:  Discontinued        1,000 mg 200 mL/hr over 60 Minutes Intravenous Every 12 hours 04/09/12 1817 04/09/12 1903   04/09/12 1741   vancomycin (VANCOCIN) IVPB 1000 mg/200 mL premix  Status:  Discontinued        1,000 mg 200 mL/hr over 60 Minutes Intravenous 60 min pre-op 04/09/12 1741 04/09/12 1747   04/07/12 1730   metroNIDAZOLE (FLAGYL) tablet 500 mg        500 mg Oral 3 times per day 04/07/12 1653     04/03/12 1400   levofloxacin (LEVAQUIN) IVPB 750 mg  Status:  Discontinued        750 mg 100 mL/hr over 90 Minutes Intravenous Every 24 hours 04/03/12 1155 04/06/12 1119   04/03/12 1200   metroNIDAZOLE (FLAGYL) IVPB 500 mg        500 mg 100 mL/hr over 60 Minutes Intravenous Every 8 hours 04/03/12 1145 04/04/12 1159   04/03/12 1200   linezolid (ZYVOX) tablet 600 mg        600 mg Oral Every 12 hours 04/03/12 1145     03/31/12 2000   piperacillin-tazobactam (ZOSYN) IVPB 3.375 g  Status:  Discontinued        3.375 g 12.5 mL/hr over 240 Minutes Intravenous Every 8 hours 03/31/12 1841 04/03/12 1145   03/28/12 1000   vancomycin (VANCOCIN) IVPB 1000 mg/200 mL premix  Status:  Discontinued        1,000 mg 200 mL/hr over 60 Minutes Intravenous Every 8 hours 03/28/12 0715 04/03/12 1008   03/28/12 1000   piperacillin-tazobactam (ZOSYN) IVPB 3.375 g  Status:  Discontinued        3.375 g 12.5 mL/hr over 240 Minutes Intravenous 3 times per day 03/28/12 0726 03/31/12 1841   03/28/12 0200   vancomycin (VANCOCIN) IVPB 1000 mg/200 mL premix        1,000 mg 200 mL/hr over 60 Minutes Intravenous  Once 03/28/12 0148 03/28/12 0413   03/28/12 0200  piperacillin-tazobactam (ZOSYN) IVPB 3.375 g       3.375 g 12.5 mL/hr over 240 Minutes Intravenous  Once 03/28/12 0148 03/28/12 0626           Medications: Scheduled Meds:    . amLODipine  10 mg Oral Daily  . atorvastatin  20 mg Oral q1800  . bisacodyl  10 mg Oral Daily  . ferrous sulfate  325 mg Oral BID WC  . guaiFENesin  1,200 mg Oral BID  . insulin aspart  0-15 Units Subcutaneous TID WC  . insulin aspart  0-5 Units Subcutaneous QHS  . insulin glargine  8 Units Subcutaneous QHS  . linezolid  600 mg Oral Q12H  . lisinopril  10 mg Oral Daily  . metoprolol tartrate  50 mg Oral BID  . metroNIDAZOLE  500 mg Oral Q8H  . DISCONTD: lisinopril  5 mg Oral Daily   Continuous Infusions:    . sodium chloride 10 mL (04/10/12 1315)  . lactated ringers Stopped (04/09/12 1749)   PRN Meds:.ondansetron (ZOFRAN) IV, oxyCODONE-acetaminophen, potassium chloride, senna-docusate, traMADol   Objective: Weight change:   Intake/Output Summary (Last 24 hours) at 04/13/12 1717 Last data filed at 04/13/12 1300  Gross per 24 hour  Intake    480 ml  Output     60 ml  Net    420 ml   Blood pressure 152/91, pulse 77, temperature 97.4 F (36.3 C), temperature source Oral, resp. rate 18, height 6\' 2"  (1.88 m), weight 196 lb (88.905 kg), SpO2 99.00%. Temp:  [97 F (36.1 C)-98.2 F (36.8 C)] 97.4 F (36.3 C) (04/22 1301) Pulse Rate:  [74-77] 77  (04/22 1301) Resp:  [18] 18  (04/22 1301) BP: (150-169)/(88-99) 152/91 mmHg (04/22 1301) SpO2:  [93 %-99 %] 99 % (04/22 1301) Weight:  [196 lb (88.905 kg)] 196 lb (88.905 kg) (04/22 0500)  Physical Exam: Gen aox4 HEENT: area of abscess has diminished in size substantially CV: RR no mgr Pulm: decreased bs right side at base,  Lab Results:  Basename 04/12/12 0848 04/11/12 0655  WBC 9.6 10.1  HGB 8.7* 8.3*  HCT 26.3* 25.1*  PLT 491* 396    BMET  Basename 04/12/12 0848 04/11/12 0655  NA 137 132*  K 4.1 4.4  CL 102 103  CO2 27 23  GLUCOSE 160* 167*  BUN 21 24*  CREATININE 1.33 1.45*  CALCIUM 8.6 8.1*    Micro Results: Recent Results (from the past 240 hour(s))  BODY FLUID  CULTURE     Status: Normal   Collection Time   04/06/12  2:40 PM      Component Value Range Status Comment   Specimen Description FLUID PLEURAL   Final    Special Requests 1 TUBE @ 2.2CC   Final    Gram Stain     Final    Value: MODERATE WBC PRESENT, PREDOMINANTLY PMN     NO ORGANISMS SEEN   Culture NO GROWTH 3 DAYS   Final    Report Status 04/10/2012 FINAL   Final   AFB CULTURE WITH SMEAR     Status: Normal (Preliminary result)   Collection Time   04/09/12  8:48 AM      Component Value Range Status Comment   Specimen Description PLEURAL FLUID RIGHT   Final    Special Requests PT ON ZYVOX FLAGYL VANCO   Final    ACID FAST SMEAR NO ACID FAST BACILLI SEEN   Final    Culture     Final    Value: CULTURE WILL BE EXAMINED FOR 6 WEEKS BEFORE ISSUING A FINAL REPORT   Report Status PENDING   Incomplete   FUNGUS CULTURE W SMEAR     Status: Normal (Preliminary result)   Collection Time   04/09/12  8:48 AM      Component Value Range Status Comment   Specimen Description PLEURAL FLUID RIGHT   Final    Special Requests PT ON ZYVOX FLAGYL VANCO   Final    Fungal Smear NO YEAST OR FUNGAL ELEMENTS SEEN   Final    Culture CULTURE IN PROGRESS FOR FOUR WEEKS   Final    Report Status PENDING   Incomplete   BODY FLUID CULTURE     Status: Normal   Collection Time   04/09/12  8:48 AM      Component Value Range Status Comment   Specimen Description PLEURAL FLUID RIGHT   Final    Special Requests PT ON ZYVOX FLAGYL VANCO   Final    Gram Stain     Final    Value: FEW WBC PRESENT,BOTH PMN AND MONONUCLEAR     NO SQUAMOUS EPITHELIAL CELLS SEEN     NO ORGANISMS SEEN   Culture NO GROWTH 3 DAYS   Final  Report Status 04/12/2012 FINAL   Final   TISSUE CULTURE     Status: Normal   Collection Time   04/09/12  9:14 AM      Component Value Range Status Comment   Specimen Description TISSUE PLEURAL RIGHT   Final    Special Requests VISCERAL PLEURA NO 1 PT ON ZYVOX FLAGYL VANCO   Final    Gram Stain     Final      Value: NO WBC SEEN     FEW SQUAMOUS EPITHELIAL CELLS PRESENT     NO ORGANISMS SEEN   Culture NO GROWTH 3 DAYS   Final    Report Status 04/12/2012 FINAL   Final   TISSUE CULTURE     Status: Normal   Collection Time   04/09/12  9:16 AM      Component Value Range Status Comment   Specimen Description TISSUE PLEURAL RIGHT   Final    Special Requests VISCERAL PLEURA NO 2 PT ON ZYVOX FLAGYL VANCO   Final    Gram Stain     Final    Value: RARE WBC PRESENT, PREDOMINANTLY PMN     NO SQUAMOUS EPITHELIAL CELLS SEEN     NO ORGANISMS SEEN   Culture NO GROWTH 3 DAYS   Final    Report Status 04/12/2012 FINAL   Final     Studies/Results: Dg Chest 2 View  04/13/2012  *RADIOLOGY REPORT*  Clinical Data: Lung surgery, pleural fluid, chest tube.  CHEST - 2 VIEW  Comparison: 04/12/2012.  CT chest 04/07/2012.  Findings: Trachea is midline.  Heart size stable. There is a combination of a small amount of pleural air and loculated pleural fluid at the base of the right hemithorax, with a single right chest tube remaining.  Right middle and right lower lobe air space disease.  Small left pleural effusion with left basilar airspace disease.  IMPRESSION:  1.  Small loculated right hydropneumothorax with a single right chest tube remaining. 2.  Bibasilar air space consolidation and left pleural effusion, stable.  Original Report Authenticated By: Luretha Rued, M.D.   Dg Chest Port 1 View  04/12/2012  *RADIOLOGY REPORT*  Clinical Data: Post right-sided VATS  PORTABLE CHEST - 1 VIEW  Comparison: 04/10/2012; 04/09/2012; chest CT - 04/07/2012  Findings: Grossly unchanged cardiac silhouette and mediastinal contours given improved inspiratory effort.   Interval removal of right jugular approach central venous catheter.  Unchanged positioning of right-sided chest tubes.  The previously identified tiny right apical pneumothorax is not well depicted on this examination.  Grossly unchanged small bilateral pleural effusions  and bibasilar heterogeneous opacities.  Overall improved aeration of the right lung base.  No new focal airspace opacities.  Grossly unchanged bones.  IMPRESSION: 1.  Interval removal of right jugular approaching central venous catheter.  Otherwise, stable position of support apparatus. No definite pneumothorax.  2.  Overall improved inspiration with unchanged small effusions and bibasilar opacities, right greater than left, favored to represent atelectasis.  Original Report Authenticated By: Rachel Moulds, M.D.      Assessment/Plan: [name] [name] is a 46 y.o. male with recent pneumococcal bacteremia and pneumonia readmitted with apparent healthcare associated pneumonia and a buccal abscess.. He had elevationg in liver fxn tests in particular alk phosph ? He has now had VATS and decortication of right lower lobe drainage of parapneumonic effusion  1) Pulmonary infiltrates with Parapneumonic effusions: sp chest tube placement: --continue antibiotics, will change to generic levaquin 500mg  with  flagyl 500mg  tid to complete total of 14 days postoperative abx  2) Buccal abscess: Dr. Hoyt Koch thinks this is due to parotid  --I am comfortable switching to levaquin and flagyl which are less reliable to cover MRSA but feel OK about it as long as we follow him closely, should cover mouth flora fine  FU I will followup him up in [facility]. I will try to overbook him Thursday afternoon on May the 2nd @ 145 pm       LOS: 17 days   Alcide Evener 04/13/2012, 5:17 PM

## 2012-04-13 NOTE — Evaluation (Signed)
Occupational Therapy Evaluation Patient Details Name: [name] [name] MRN: [id number] DOB: Nov 20, 1966 Today's Date: 04/13/2012 Time: [id number] OT Time Calculation (min): 15 min  OT Assessment / Plan / Recommendation Clinical Impression  Pt admitted admitted for PNA and parapneumonic effusion with further generalize weakness and low Hgb complicated by multiple procedures for various reasons. Will benefit from skilled OT in the acute setting to maximize I with ADL and ADL mobility to Mod I-I level upon d/c home    OT Assessment  Patient needs continued OT Services    Follow Up Recommendations  No OT follow up    Equipment Recommendations  Tub/shower seat    Frequency Min 2X/week    Precautions / Restrictions Precautions Precautions: Fall Precaution Comments: Rt chest tube Restrictions Weight Bearing Restrictions: No   Pertinent Vitals/Pain C/o "mild" pain in right chest at tube site- premedicated.    ADL  Eating/Feeding: Simulated;Independent Where Assessed - Eating/Feeding: Chair Grooming: Simulated;Brushing hair Where Assessed - Grooming: Standing at sink Upper Body Bathing: Simulated;Supervision/safety Where Assessed - Upper Body Bathing:  (standing at sink) Lower Body Bathing: Simulated;Supervision/safety Where Assessed - Lower Body Bathing: Standing at sink Upper Body Dressing: Simulated;Set up Where Assessed - Upper Body Dressing: Sitting, chair Lower Body Dressing: Performed;Supervision/safety;Set up Where Assessed - Lower Body Dressing: Sitting, chair;Sit to stand from chair Toilet Transfer: Simulated;Modified independent Toilet Transfer Method: Ambulating Toileting - Clothing Manipulation: Simulated;Supervision/safety Where Assessed - Toileting Clothing Manipulation: Standing Toileting - Hygiene: Simulated;Supervision/safety Where Assessed - Toileting Hygiene: Sit on 3-in-1 or toilet Tub/Shower Transfer: Simulated;Supervision/safety Tub/Shower Transfer  Method: Ambulating Equipment Used: Rolling walker Ambulation Related to ADLs: Mod I/S for RW ambulation - assist for lines ADL Comments: Pt is somewhat unsteady, but with no LOB during eval. Pt is aware of this and states he feels he is getting steadier everyday as he is getting stronger. Educated pt on OPPT and encouraged him to seek this out if he feels his balance is still compromised after returning home. D/w pt different DME- pt ultimately decided on just a tub/seat for shower    OT Goals Acute Rehab OT Goals OT Goal Formulation: With patient Time For Goal Achievement: 04/20/12 Potential to Achieve Goals: Good ADL Goals Pt Will Perform Grooming: Independently;Standing at sink ADL Goal: Grooming - Progress: Goal set today Pt Will Perform Lower Body Bathing: with modified independence;Sit to stand from chair;Sit to stand in shower;Sit to stand from bed ADL Goal: Lower Body Bathing - Progress: Goal set today Pt Will Perform Lower Body Dressing: Independently;Sit to stand from chair;Sit to stand from bed ADL Goal: Lower Body Dressing - Progress: Goal set today Pt Will Transfer to Toilet: with modified independence;Ambulation;with DME;Regular height toilet ADL Goal: Toilet Transfer - Progress: Goal set today Pt Will Perform Tub/Shower Transfer: Tub transfer;Ambulation;with DME;Shower seat without back;with modified independence ADL Goal: Tub/Shower Transfer - Progress: Goal set today  Visit Information  Last OT Received On: 04/13/12 Assistance Needed: +1    Subjective Data  Subjective: "I just know I want a tub seat for my shower" Patient Stated Goal: Return home to be with daughter   Prior Functioning  Home Living Lives With: Daughter Type of Home: Apartment Home Access: Stairs to enter Technical brewer of Steps: 14 Entrance Stairs-Rails: Left Home Layout: One level Bathroom Shower/Tub: Chiropodist: Standard Bathroom Accessibility: Yes Home Adaptive  Equipment: None Prior Function Level of Independence: Independent Able to Take Stairs?: Yes Driving: Yes Vocation: Full time employment Communication Communication: No difficulties  Cognition  Overall Cognitive Status: Appears within functional limits for tasks assessed/performed    Extremity/Trunk Assessment Right Upper Extremity Assessment RUE ROM/Strength/Tone: Within functional levels RUE Sensation: WFL - Light Touch RUE Coordination: WFL - gross/fine motor Left Upper Extremity Assessment LUE ROM/Strength/Tone: Within functional levels LUE Sensation: WFL - Light Touch LUE Coordination: WFL - gross/fine motor   Mobility Transfers Sit to Stand: 6: Modified independent (Device/Increase time);From chair/3-in-1;With armrests Stand to Sit: 6: Modified independent (Device/Increase time);To chair/3-in-1;With armrests           End of Session OT - End of Session Equipment Utilized During Treatment: Gait belt Activity Tolerance: Patient tolerated treatment well Patient left: in chair;with call bell/phone within reach;with family/visitor present   Timeka Goette 04/13/2012, 4:09 PM

## 2012-04-13 NOTE — Progress Notes (Signed)
PCA fentanyl still left in pt's room locked in PCA pump apparently from the weekend. Was not told when it was D/Ced. Wasted 73mL from a 79mL cartridge. 48mL were given when the pump was intact this weekend. Kim, RN, charge nurse witnessed the waste of the fentanyl into the sink in the nurses station.

## 2012-04-13 NOTE — Progress Notes (Addendum)
Pt ambulated approx 500 feet without oxygen. O2 was 88-90% on room air while ambulating. Pt was 91% on room air at rest.

## 2012-04-13 NOTE — Discharge Instructions (Signed)
Thoracoscopy Care After Refer to this sheet in the next few weeks. These discharge instructions provide you with general information on caring for yourself after you leave the hospital. Your caregiver may also give you specific instructions. Your treatment has been planned according to the most current medical practices available, but unavoidable complications sometimes occur. If you have any problems or questions after discharge, call your caregiver. HOME CARE INSTRUCTIONS   Remove the bandage (dressing) over your chest tube site as directed by your caregiver.   It is normal to be sore for a couple weeks following surgery. See your caregiver if this seems to be getting worse rather than better.   Only take over-the-counter or prescription medicines for pain, discomfort, or fever as directed by your caregiver. It is very important to take pain medicine when you need it so that you will cough and breathe deeply enough to clear mucus (phlegm) and expand your lungs.   If it hurts to cough, hold a pillow against your chest when you cough. This may help with the discomfort. In spite of the discomfort, cough frequently, as this helps protect against getting an infection in your lung (pneumonia).   Taking deep breaths keeps lungs inflated and protects against pneumonia. Most patients will go home with an incentive spirometer that encourages deep breathing.   You may resume a normal diet and activities as directed.   Use showers for bathing until you see your caregiver, or as instructed.   Change dressings if necessary or as directed.   Avoid lifting or driving until you are instructed otherwise.   Make an appointment to see your caregiver for stitch (suture) or staple removal when instructed.   Do not travel by airplane for 2 weeks after the chest tube is removed.  SEEK MEDICAL CARE IF:   You are bleeding from your wounds.   You have redness, swelling, or increasing pain in the wounds.    Your heartbeat feels irregular or very fast.   There is pus coming from your wounds.   There is a bad smell coming from the wound or dressing.  SEEK IMMEDIATE MEDICAL CARE IF:   You have a fever.   You develop a rash.   You have difficulty breathing.   You develop any reaction or side effects to medicines given.   You develop lightheadedness or feel faint.   You develop shortness of breath or chest pain.  MAKE SURE YOU:   Understand these instructions.   Will watch your condition.   Will get help right away if you are not doing well or get worse.  Document Released: 06/28/2005 Document Revised: 11/28/2011 Document Reviewed: 05/29/2011 [facility] Patient Information 2012 [place].

## 2012-04-13 NOTE — Progress Notes (Signed)
Pt's chest tube removed and occlusive dressing applied to site per order. Sutures tied and knotted. Pt tolerated well. Will continue to monitor.

## 2012-04-14 ENCOUNTER — Inpatient Hospital Stay (HOSPITAL_COMMUNITY): Payer: Managed Care, Other (non HMO)

## 2012-04-14 LAB — GLUCOSE, CAPILLARY: Glucose-Capillary: 150 mg/dL — ABNORMAL HIGH (ref 70–99)

## 2012-04-14 MED ORDER — LEVOFLOXACIN 500 MG PO TABS: 500.0000 mg | ORAL_TABLET | Freq: Every day | ORAL | Status: DC

## 2012-04-14 MED ORDER — AMLODIPINE BESYLATE 10 MG PO TABS: 10.0000 mg | ORAL_TABLET | Freq: Every day | ORAL | Status: DC

## 2012-04-14 MED ORDER — INSULIN GLARGINE 100 UNIT/ML ~~LOC~~ SOLN: 8.0000 [IU] | Freq: Every day | SUBCUTANEOUS | Status: DC

## 2012-04-14 MED ORDER — METOPROLOL TARTRATE 50 MG PO TABS: 50.0000 mg | ORAL_TABLET | Freq: Two times a day (BID) | ORAL | Status: DC

## 2012-04-14 MED ORDER — SENNOSIDES-DOCUSATE SODIUM 8.6-50 MG PO TABS: 1.0000 | ORAL_TABLET | Freq: Every evening | ORAL | Status: DC | PRN

## 2012-04-14 MED ORDER — FERROUS SULFATE 325 (65 FE) MG PO TABS: 325.0000 mg | ORAL_TABLET | Freq: Three times a day (TID) | ORAL | Status: DC

## 2012-04-14 MED ORDER — METRONIDAZOLE 500 MG PO TABS: 500.0000 mg | ORAL_TABLET | Freq: Three times a day (TID) | ORAL | Status: AC

## 2012-04-14 MED ORDER — OXYCODONE-ACETAMINOPHEN 5-325 MG PO TABS: 1.0000 | ORAL_TABLET | ORAL | Status: AC | PRN

## 2012-04-14 MED ORDER — GUAIFENESIN ER 600 MG PO TB12: 1200.0000 mg | ORAL_TABLET | Freq: Two times a day (BID) | ORAL | Status: DC

## 2012-04-14 MED ORDER — SORBITOL 70 % SOLN
30.0000 mL | Freq: Once | Status: DC
  Filled 2012-04-14: qty 30

## 2012-04-14 NOTE — Progress Notes (Signed)
Pt. Discharged 04/14/2012  8:56 AM Discharge instructions reviewed with patient/family. Patient/family verbalized understanding. All Rx's given. Questions answered as needed. Pt. Discharged to home with family/self.  [name] [name]

## 2012-04-14 NOTE — Progress Notes (Signed)
5 Days Post-Op Procedure(s) (LRB): VIDEO ASSISTED THORACOSCOPY (VATS)/EMPYEMA (Right) Subjective:  [name] [name] has no complaints this morning.  He had some questions regarding incision care which were answered  Objective: Vital signs in last 24 hours: Temp:  [97.4 F (36.3 C)-98 F (36.7 C)] 98 F (36.7 C) (04/23 0509) Pulse Rate:  [77-92] 79  (04/23 0509) Cardiac Rhythm:  [-] Normal sinus rhythm (04/22 2020) Resp:  [18] 18  (04/23 0509) BP: (136-152)/(83-91) 136/83 mmHg (04/23 0509) SpO2:  [94 %-99 %] 94 % (04/23 0509)  Intake/Output from previous day: 04/22 0701 - 04/23 0700 In: 480 [P.O.:480] Out: -      General appearance: alert, cooperative and no distress Neurologic: intact Heart: regular rate and rhythm Lungs: clear to auscultation bilaterally Abdomen: soft, non-tender; bowel sounds normal; no masses,  no organomegaly Wound: clean and dry  Lab Results:  [facility] 04/12/12 0848  WBC 9.6  HGB 8.7*  HCT 26.3*  PLT 491*   BMET:  Basename 04/12/12 0848  NA 137  K 4.1  CL 102  CO2 27  GLUCOSE 160*  BUN 21  CREATININE 1.33  CALCIUM 8.6    PT/INR: No results found for this basename: LABPROT,INR in the last 72 hours ABG    Component Value Date/Time   PHART 7.427 04/10/2012 0521   HCO3 24.6* 04/10/2012 0521   TCO2 25.7 04/10/2012 0521   O2SAT 94.5 04/10/2012 0521   CBG (last 3)   Basename 04/14/12 0625 04/13/12 2143 04/13/12 1633  GLUCAP 150* 175* 147*    Assessment/Plan: S/P Procedure(s) (LRB): VIDEO ASSISTED THORACOSCOPY (VATS)/EMPYEMA (Right)  2. Pulmonary- final chest tube removed yesterday, CXR does not reveal any evidence of pneumothorax, there is some atelectasis bilaterally 3. Dispo- patient doing well.  Will follow up with Dr. Roxan Hockey on 04/29/2012 at 1:00.  Safe for d/c    LOS: 18 days    Ahmed Prima, Junie Panning 04/14/2012

## 2012-04-20 ENCOUNTER — Other Ambulatory Visit: Payer: Managed Care, Other (non HMO)

## 2012-04-21 ENCOUNTER — Ambulatory Visit: Payer: Managed Care, Other (non HMO) | Admitting: Internal Medicine

## 2012-04-21 DIAGNOSIS — Z0289 Encounter for other administrative examinations: Secondary | ICD-10-CM

## 2012-04-23 ENCOUNTER — Ambulatory Visit
Admission: RE | Admit: 2012-04-23 | Discharge: 2012-04-23 | Disposition: A | Discharge status: Home / Self Care | Payer: Managed Care, Other (non HMO) | Source: Ambulatory Visit | Attending: Infectious Disease | Admitting: Infectious Disease

## 2012-04-23 ENCOUNTER — Ambulatory Visit (INDEPENDENT_AMBULATORY_CARE_PROVIDER_SITE_OTHER): Payer: Managed Care, Other (non HMO) | Admitting: Infectious Disease

## 2012-04-23 VITALS — BP 158/98 | HR 86 | Temp 98.1°F | Ht 74.0 in | Wt 192.0 lb

## 2012-04-23 DIAGNOSIS — L729 Follicular cyst of the skin and subcutaneous tissue, unspecified: Secondary | ICD-10-CM

## 2012-04-23 DIAGNOSIS — R7881 Bacteremia: Secondary | ICD-10-CM

## 2012-04-23 DIAGNOSIS — J90 Pleural effusion, not elsewhere classified: Secondary | ICD-10-CM

## 2012-04-23 DIAGNOSIS — L723 Sebaceous cyst: Secondary | ICD-10-CM

## 2012-04-23 DIAGNOSIS — B953 Streptococcus pneumoniae as the cause of diseases classified elsewhere: Secondary | ICD-10-CM

## 2012-04-23 DIAGNOSIS — J189 Pneumonia, unspecified organism: Secondary | ICD-10-CM

## 2012-04-23 DIAGNOSIS — R7989 Other specified abnormal findings of blood chemistry: Secondary | ICD-10-CM

## 2012-04-23 LAB — CBC WITH DIFFERENTIAL/PLATELET
Basophils Absolute: 0.1 10*3/uL (ref 0.0–0.1)
Basophils Relative: 1 % (ref 0–1)
Eosinophils Absolute: 0.5 10*3/uL (ref 0.0–0.7)
Eosinophils Relative: 5 % (ref 0–5)
HCT: 30.9 % — ABNORMAL LOW (ref 39.0–52.0)
Hemoglobin: 9.7 g/dL — ABNORMAL LOW (ref 13.0–17.0)
Lymphocytes Relative: 21 % (ref 12–46)
Lymphs Abs: 2 10*3/uL (ref 0.7–4.0)
MCH: 27.6 pg (ref 26.0–34.0)
MCHC: 31.4 g/dL (ref 30.0–36.0)
MCV: 87.8 fL (ref 78.0–100.0)
Monocytes Absolute: 0.6 10*3/uL (ref 0.1–1.0)
Monocytes Relative: 6 % (ref 3–12)
Neutro Abs: 6.6 10*3/uL (ref 1.7–7.7)
Neutrophils Relative %: 68 % (ref 43–77)
Platelets: 358 10*3/uL (ref 150–400)
RBC: 3.52 MIL/uL — ABNORMAL LOW (ref 4.22–5.81)
RDW: 16.1 % — ABNORMAL HIGH (ref 11.5–15.5)
WBC: 9.7 10*3/uL (ref 4.0–10.5)

## 2012-04-23 LAB — COMPLETE METABOLIC PANEL WITH GFR
ALT: 20 U/L (ref 0–53)
AST: 27 U/L (ref 0–37)
Albumin: 2.9 g/dL — ABNORMAL LOW (ref 3.5–5.2)
Alkaline Phosphatase: 292 U/L — ABNORMAL HIGH (ref 39–117)
BUN: 35 mg/dL — ABNORMAL HIGH (ref 6–23)
CO2: 25 mEq/L (ref 19–32)
Calcium: 8.4 mg/dL (ref 8.4–10.5)
Chloride: 103 mEq/L (ref 96–112)
Creat: 1.4 mg/dL — ABNORMAL HIGH (ref 0.50–1.35)
GFR, Est African American: 70 mL/min
GFR, Est Non African American: 60 mL/min
Glucose, Bld: 148 mg/dL — ABNORMAL HIGH (ref 70–99)
Potassium: 4.9 mEq/L (ref 3.5–5.3)
Sodium: 138 mEq/L (ref 135–145)
Total Bilirubin: 0.4 mg/dL (ref 0.3–1.2)
Total Protein: 6.7 g/dL (ref 6.0–8.3)

## 2012-04-23 MED ORDER — LEVOFLOXACIN 500 MG PO TABS: 500.0000 mg | ORAL_TABLET | Freq: Every day | ORAL | Status: AC

## 2012-04-23 NOTE — Assessment & Plan Note (Signed)
Recheck cxr today 2view, and continue levaquin for now x 2 more weeks (may alter based on cxr). Fu at the end of the month

## 2012-04-23 NOTE — Assessment & Plan Note (Signed)
Not sure cause. Recheck today

## 2012-04-23 NOTE — Assessment & Plan Note (Signed)
Sp more than sufficient treatment

## 2012-04-23 NOTE — Progress Notes (Signed)
  Subjective:    Patient ID: [name] [name], male    DOB: [birth year], 46 y.o.   MRN: [id number]  HPI  46 y.o. male with recent pneumococcal bacteremia and pneumonia readmitted with apparent healthcare associated pneumonia and a buccal abscess.  He has now had VATS and decortication of right lower lobe drainage of parapneumonic effusion in addition to drainage of his buccal abscess. He is doing well without fevers, malaise or systemic symptoms. NO cough, minimal shortness of breath. He finished levaquin today. I spent greater than 45 minutes with the patient including greater than 50% of time in face to face counsel of the patient and in coordination of their care.    Review of Systems  Constitutional: Negative for fever, chills, diaphoresis, activity change, appetite change, fatigue and unexpected weight change.  HENT: Negative for congestion, sore throat, rhinorrhea, sneezing, trouble swallowing and sinus pressure.   Eyes: Negative for photophobia and visual disturbance.  Respiratory: Positive for shortness of breath. Negative for cough, chest tightness, wheezing and stridor.   Cardiovascular: Negative for chest pain, palpitations and leg swelling.  Gastrointestinal: Negative for nausea, vomiting, abdominal pain, diarrhea, constipation, blood in stool, abdominal distention and anal bleeding.  Genitourinary: Negative for dysuria, hematuria, flank pain and difficulty urinating.  Musculoskeletal: Negative for myalgias, back pain, joint swelling, arthralgias and gait problem.  Skin: Negative for color change, pallor, rash and wound.  Neurological: Negative for dizziness, tremors, weakness and light-headedness.  Hematological: Negative for adenopathy. Does not bruise/bleed easily.  Psychiatric/Behavioral: Negative for behavioral problems, confusion, sleep disturbance, dysphoric mood, decreased concentration and agitation.       Objective:   Physical Exam  Constitutional: He is oriented  to person, place, and time. He appears well-developed and well-nourished. No distress.  HENT:  Head: Normocephalic and atraumatic.  Mouth/Throat: Oropharynx is clear and moist. No oropharyngeal exudate.    Eyes: Conjunctivae and EOM are normal. Pupils are equal, round, and reactive to light. No scleral icterus.  Neck: Normal range of motion. Neck supple. No JVD present.  Cardiovascular: Normal rate, regular rhythm and normal heart sounds.  Exam reveals no gallop and no friction rub.   No murmur heard. Pulmonary/Chest: Effort normal. No respiratory distress. He has decreased breath sounds in the right lower field. He has no wheezes. He has no rales. He exhibits no tenderness.  Abdominal: He exhibits no distension and no mass. There is no tenderness. There is no rebound and no guarding.  Musculoskeletal: He exhibits no edema and no tenderness.  Lymphadenopathy:    He has no cervical adenopathy.  Neurological: He is alert and oriented to person, place, and time. He has normal reflexes. He exhibits normal muscle tone. Coordination normal.  Skin: Skin is warm and dry. He is not diaphoretic. No erythema. No pallor.     Psychiatric: He has a normal mood and affect. His behavior is normal. Judgment and thought content normal.          Assessment & Plan:  Parapneumonic effusion Recheck cxr today 2view, and continue levaquin for now x 2 more weeks (may alter based on cxr). Fu at the end of the month  Elevated LFTs Not sure cause. Recheck today  Cyst of skin ? This due to parotitis or dental disease. He does need fu with dentist to address  Extensive dental disease seen on CT mfacial  Bacteremia due to Streptococcus pneumoniae Sp more than sufficient treatment

## 2012-04-23 NOTE — Assessment & Plan Note (Signed)
?   This due to parotitis or dental disease. He does need fu with dentist to address  Extensive dental disease seen on CT mfacial

## 2012-04-24 ENCOUNTER — Ambulatory Visit (INDEPENDENT_AMBULATORY_CARE_PROVIDER_SITE_OTHER): Payer: Managed Care, Other (non HMO) | Admitting: Emergency Medicine

## 2012-04-24 ENCOUNTER — Encounter: Payer: Self-pay | Admitting: Emergency Medicine

## 2012-04-24 ENCOUNTER — Other Ambulatory Visit: Payer: Self-pay | Admitting: Thoracic Surgery (Cardiothoracic Vascular Surgery)

## 2012-04-24 VITALS — BP 158/102 | HR 90 | Temp 98.0°F | Ht 74.0 in | Wt 194.0 lb

## 2012-04-24 DIAGNOSIS — J869 Pyothorax without fistula: Secondary | ICD-10-CM

## 2012-04-24 DIAGNOSIS — J13 Pneumonia due to Streptococcus pneumoniae: Secondary | ICD-10-CM

## 2012-04-24 NOTE — Assessment & Plan Note (Signed)
Complicated by R complicated pleural effusion and empyema requiring VATS decortication. Complicated hosp course with readmission for surgery. Still needs to get his suture out, rehabilitate. I do not believe he will be ready for work until June 1. He will need a repeat CT scan of the chest in mid June to compare with priors for resolution of infiltrate and pleural disease. I will see him after that scan to compare and review.  - CT scan June - rov to review the scan after - complete abx as directed by Dr Drucilla Schmidt

## 2012-04-24 NOTE — Progress Notes (Signed)
Subjective:    Patient ID: [name] [name], male    DOB: 1966-07-24, 45 y.o.   MRN: [id number]  HPI 46 yo man, never smoker, hx HTN, DM, hyperlipidemia. I saw him when admitted for RLL pneumococcal PNA, then when he was readmitted for parapneumonic effusion +/- empyema. He underwent successful VATS decortication by Dr Roxan Hockey in 04/09/12. Course c/b anemia, required endoscopy/CSY.  He has been followed by Dr Drucilla Schmidt, is taking abx and will stay on levaquin for 2 more weeks. He is having some exertional SOB after climbing stairs. Having some R sided pain.    Review of Systems  Constitutional: Negative.  Negative for fever and unexpected weight change.  HENT: Negative for ear pain, nosebleeds, congestion, sore throat, rhinorrhea, sneezing, trouble swallowing, dental problem, postnasal drip and sinus pressure.   Eyes: Negative.  Negative for redness and itching.  Respiratory: Positive for shortness of breath. Negative for cough, chest tightness and wheezing.   Cardiovascular: Negative.  Negative for palpitations and leg swelling.  Gastrointestinal: Negative.  Negative for nausea and vomiting.  Genitourinary: Negative.  Negative for dysuria.  Musculoskeletal: Negative.  Negative for joint swelling.  Skin: Negative.  Negative for rash.  Neurological: Positive for headaches.  Hematological: Negative.  Does not bruise/bleed easily.  Psychiatric/Behavioral: Negative.  Negative for dysphoric mood. The patient is not nervous/anxious.    Past Medical History  Diagnosis Date  . Diabetes mellitus   . Pneumonia 02/2012    Strep pneumoniae bilateral pneumonia complicated by bacteremia  . Bacteremia      Family History  Problem Relation Age of Onset  . Diabetes Maternal Grandmother      History   Social History  . Marital Status: Divorced    Spouse Name: N/A    Number of Children: N/A  . Years of Education: N/A   Occupational History  . MACHINIST    Social History Main Topics  .  Smoking status: Never Smoker   . Smokeless tobacco: Never Used  . Alcohol Use: Yes     rare ETOH use  . Drug Use: No  . Sexually Active: Not on file   Other Topics Concern  . Not on file   Social History Narrative   He is from [place] and lives at home here with his daughter. He is divorced. 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      Allergies  Allergen Reactions  . Penicillins Rash    Tolerating Ceftriaxone (03/12/12)     Outpatient Prescriptions Prior to Visit  Medication Sig Dispense Refill  . amLODipine (NORVASC) 10 MG tablet Take 1 tablet (10 mg total) by mouth daily.  30 tablet  0  . aspirin 81 MG tablet Take 81 mg by mouth every morning.       . ferrous sulfate 325 (65 FE) MG tablet Take 1 tablet (325 mg total) by mouth 3 (three) times daily with meals.  90 tablet  0  . insulin aspart (NOVOLOG) 100 UNIT/ML injection Inject 11 Units into the skin 3 (three) times daily before meals. Based on sliding scale. Pt took 16 units today at 1600.      Marland Kitchen levofloxacin (LEVAQUIN) 500 MG tablet Take 1 tablet (500 mg total) by mouth daily.  14 tablet  1  . metoprolol (LOPRESSOR) 50 MG tablet Take 1 tablet (50 mg total) by mouth 2 (two) times daily.  60 tablet  0  . metroNIDAZOLE (FLAGYL) 500 MG tablet Take  1 tablet (500 mg total) by mouth every 8 (eight) hours.  30 tablet  0  . oxyCODONE-acetaminophen (PERCOCET) 5-325 MG per tablet Take 1-2 tablets by mouth every 4 (four) hours as needed.  30 tablet  0  . rosuvastatin (CRESTOR) 10 MG tablet Take 10 mg by mouth every morning.       Marland Kitchen guaiFENesin (MUCINEX) 600 MG 12 hr tablet Take 2 tablets (1,200 mg total) by mouth 2 (two) times daily.  60 tablet  0  . insulin glargine (LANTUS) 100 UNIT/ML injection Inject 8 Units into the skin at bedtime.  10 mL  0  . lisinopril (PRINIVIL,ZESTRIL) 5 MG tablet Take 5 mg by mouth every morning.       . Multiple Vitamin (MULITIVITAMIN WITH MINERALS) TABS Take 1 tablet  by mouth every morning.      . senna-docusate (SENOKOT-S) 8.6-50 MG per tablet Take 1 tablet by mouth at bedtime as needed.  30 tablet  0         Objective:   Physical Exam  Gen: Pleasant, well-nourished, in no distress,  normal affect  ENT: No lesions,  mouth clear,  oropharynx clear, no postnasal drip  Neck: No JVD, no TMG, no carotid bruits  Lungs: No use of accessory muscles, no dullness to percussion, clear without rales or rhonchi; Sutures from VATS still in place, no cellulitis present  Cardiovascular: RRR, heart sounds normal, no murmur or gallops, no peripheral edema  Musculoskeletal: No deformities, no cyanosis or clubbing  Neuro: alert, non focal  Skin: Warm, no lesions or rashes      Assessment & Plan:  Pneumococcal pneumonia Complicated by R complicated pleural effusion and empyema requiring VATS decortication. Complicated hosp course with readmission for surgery. Still needs to get his suture out, rehabilitate. I do not believe he will be ready for work until June 1. He will need a repeat CT scan of the chest in mid June to compare with priors for resolution of infiltrate and pleural disease. I will see him after that scan to compare and review.  - CT scan June - rov to review the scan after - complete abx as directed by Dr Drucilla Schmidt

## 2012-04-24 NOTE — Patient Instructions (Signed)
Please continue your antibiotics as ordered by Dr Drucilla Schmidt We will repeat your CT scan of the chest in mid-June Follow with Dr Lamonte Sakai in June after the scan to review You will need to recover from your pneumonia and your surgery. I do not believe you will be able to return to work until June 1.

## 2012-04-27 ENCOUNTER — Telehealth: Payer: Self-pay | Admitting: Emergency Medicine

## 2012-04-27 NOTE — Telephone Encounter (Signed)
Pt requested that his Ov note from 04/24/12 be faxed to [facility], ATTN:  Juliann Pulse. Fax number is [phone]. OV note faxed. Pt aware.

## 2012-04-29 ENCOUNTER — Encounter: Payer: Self-pay | Admitting: Thoracic Surgery (Cardiothoracic Vascular Surgery)

## 2012-04-29 ENCOUNTER — Ambulatory Visit
Admission: RE | Admit: 2012-04-29 | Discharge: 2012-04-29 | Disposition: A | Discharge status: Home / Self Care | Payer: Managed Care, Other (non HMO) | Source: Ambulatory Visit | Attending: Thoracic Surgery (Cardiothoracic Vascular Surgery) | Admitting: Thoracic Surgery (Cardiothoracic Vascular Surgery)

## 2012-04-29 ENCOUNTER — Ambulatory Visit (INDEPENDENT_AMBULATORY_CARE_PROVIDER_SITE_OTHER): Payer: Self-pay | Admitting: Thoracic Surgery (Cardiothoracic Vascular Surgery)

## 2012-04-29 VITALS — BP 145/86 | HR 86 | Temp 98.6°F | Resp 16 | Ht 74.0 in | Wt 203.0 lb

## 2012-04-29 DIAGNOSIS — J869 Pyothorax without fistula: Secondary | ICD-10-CM

## 2012-04-29 DIAGNOSIS — J90 Pleural effusion, not elsewhere classified: Secondary | ICD-10-CM

## 2012-04-29 DIAGNOSIS — Z09 Encounter for follow-up examination after completed treatment for conditions other than malignant neoplasm: Secondary | ICD-10-CM

## 2012-04-29 NOTE — Progress Notes (Signed)
HPI:  [name] returns today for a scheduled postoperative followup visit. He had a right vats drainage of empyema and decortication on 04/09/2012. He did well postoperatively and was discharged on postoperative day #4. He was discharged on Levaquin and Flagyl.  He states that he still having to take the pain medication primarily at night. He is able to get up and down stairs without difficulty, but is not able to carry his groceries up stairs to his apartment. He apparently was told to return to work in one week when he was discharged from the hospital by one of the hospitalists, which was a contradiction what I told him in the expected 8-10 week recovery period before he would be ready to return to work.  Past Medical History  Diagnosis Date  . Diabetes mellitus   . Pneumonia 02/2012    Strep pneumoniae bilateral pneumonia complicated by bacteremia  . Bacteremia       Current Outpatient Prescriptions  Medication Sig Dispense Refill  . amLODipine (NORVASC) 10 MG tablet Take 1 tablet (10 mg total) by mouth daily.  30 tablet  0  . aspirin 81 MG tablet Take 81 mg by mouth every morning.       . ferrous sulfate 325 (65 FE) MG tablet Take 1 tablet (325 mg total) by mouth 3 (three) times daily with meals.  90 tablet  0  . insulin aspart (NOVOLOG) 100 UNIT/ML injection Inject 11 Units into the skin 3 (three) times daily before meals. Based on sliding scale. Pt took 16 units today at 1600.      Marland Kitchen insulin glargine (LANTUS) 100 UNIT/ML injection Inject 40 Units into the skin at bedtime.      Marland Kitchen levofloxacin (LEVAQUIN) 500 MG tablet Take 1 tablet (500 mg total) by mouth daily.  14 tablet  1  . metoprolol (LOPRESSOR) 50 MG tablet Take 1 tablet (50 mg total) by mouth 2 (two) times daily.  60 tablet  0  . rosuvastatin (CRESTOR) 10 MG tablet Take 10 mg by mouth every morning.       . metroNIDAZOLE (FLAGYL) 500 MG tablet       . oxyCODONE-acetaminophen (PERCOCET) 5-325 MG per tablet          Physical Exam BP 145/86  Pulse 86  Temp(Src) 98.6 F (37 C) (Oral)  Resp 16  Ht 6\' 2"  (1.88 m)  Wt 203 lb (92.08 kg)  BMI 26.06 kg/m2  SpO2 96% Well-appearing 46 year old man in no acute distress He does have discomfort with raising his right arm Incisions are clean dry and intact, chest tube sites with slight skin separation, no sign of infection Lungs diminished breath sounds right base  Diagnostic Tests: Chest x-ray shows a stable appearance to the film from May 2 with persistent opacity the right base pleural parenchymal thickening and scarring  Impression: 46 year old gentleman status post right VATS for drainage of empyema and decortication of the right lower lobe. This is a major operation and I think he is doing well at this point in time, about as well as you would expect, but he still has quite a ways to go to be fully recovered. He had a severe pneumonia with abscess.  His chest x-ray has a typical post decortication appearance. Over time this should improve, but likely will never be completely normal.  He may begin driving as long as he is not having to take the pain medication during the day. He is to limit himself to short trips  and low speeds. There is no restrictions on him in terms of the amount of weight and left although he was warned do anything over 10 pounds because of considerable discomfort for the next several weeks.  I'm going to release some to return to work as of July 1 which would be about 10 weeks from the surgery. If his recovery perceives more rapidly and he feels she is ready to return sooner, I will discussed that with him at that time.  Plan: I will plan to see him back in one month with a PA and lateral chest x-ray to check on his progress.

## 2012-05-05 ENCOUNTER — Encounter: Payer: Self-pay | Admitting: Infectious Disease

## 2012-05-07 ENCOUNTER — Telehealth: Payer: Self-pay | Admitting: *Deleted

## 2012-05-07 NOTE — Telephone Encounter (Signed)
Arlene, a nurse case manager with [employer] called [phone])  to report a rash pt developed 4 days ago where he wears support stockings. She thinks it may be attributable to the IV antibiotics and feels he should be seen.  His pcp is away & has no covering md. We do not have anyone here to see him either. I spoke with Orland Mustard, RN about this. I asked Dr. Linus Salmons & he states it is unlikely that a med reax would be on his legs where the support stocking were. I called her back & told her this. I suggested he see his cardiologist or pulmonologist if they can fit him in. She will continue to work on finding a doctor to see him

## 2012-05-08 LAB — FUNGUS CULTURE W SMEAR: Fungal Smear: NONE SEEN

## 2012-05-20 ENCOUNTER — Encounter: Payer: Self-pay | Admitting: Infectious Disease

## 2012-05-20 ENCOUNTER — Ambulatory Visit (INDEPENDENT_AMBULATORY_CARE_PROVIDER_SITE_OTHER): Payer: Managed Care, Other (non HMO) | Admitting: Infectious Disease

## 2012-05-20 VITALS — BP 131/88 | HR 96 | Temp 97.9°F | Ht 74.0 in | Wt 183.4 lb

## 2012-05-20 DIAGNOSIS — R7881 Bacteremia: Secondary | ICD-10-CM

## 2012-05-20 DIAGNOSIS — J918 Pleural effusion in other conditions classified elsewhere: Secondary | ICD-10-CM

## 2012-05-20 DIAGNOSIS — L729 Follicular cyst of the skin and subcutaneous tissue, unspecified: Secondary | ICD-10-CM

## 2012-05-20 DIAGNOSIS — J189 Pneumonia, unspecified organism: Secondary | ICD-10-CM

## 2012-05-20 DIAGNOSIS — J90 Pleural effusion, not elsewhere classified: Secondary | ICD-10-CM

## 2012-05-20 DIAGNOSIS — L723 Sebaceous cyst: Secondary | ICD-10-CM

## 2012-05-20 DIAGNOSIS — B953 Streptococcus pneumoniae as the cause of diseases classified elsewhere: Secondary | ICD-10-CM

## 2012-05-20 NOTE — Assessment & Plan Note (Signed)
I am comfortable taking him off of his antibiotics and observing him. He will get a repeat CT scan in June. He'll followup in infectious disease clinic as needed.

## 2012-05-20 NOTE — Assessment & Plan Note (Signed)
Resolved

## 2012-05-20 NOTE — Assessment & Plan Note (Signed)
Resolved but he should see an dentist to make sure that any of odontogenic sources of infection a been addressed.

## 2012-05-20 NOTE — Progress Notes (Signed)
Subjective:    Patient ID: [name] [name], male    DOB: 1966-05-23, 46 y.o.   MRN: [id number]  HPI  46 y.o. male with recent pneumococcal bacteremia and pneumonia readmitted with apparent healthcare associated pneumonia and a buccal abscess. He has now had VATS and decortication of right lower lobe drainage of parapneumonic effusion in addition to drainage of his buccal abscess. When I last saw him he had completed his levofloxacin. We obtained a a chest x-ray which showed persistence of a right pleural effusion. We therefore extended his levofloxacin through today. Patient has been seen by Dr. Roxan Hockey with cardiovascular surgery as well as by Dr. Lamonte Sakai will [facility]. He is doing much better having now come off of the higher ose Norvasc he has reduced swelling in his legs. He still does have significant dyspnea and fatigue with walking up stairs. He also has a little bit of pleurisy when laughing. He is without  fevers, malaise or systemic symptoms. NO cough, minimal shortness of breath. Buccal abscess is completely resolved. He does need to see his dentist however. Repeat CT scan of the lungs is planned for June.  Review of Systems  Constitutional: Negative for fever, chills, diaphoresis, activity change, appetite change, fatigue and unexpected weight change.  HENT: Negative for congestion, sore throat, rhinorrhea, sneezing, trouble swallowing and sinus pressure.   Eyes: Negative for photophobia and visual disturbance.  Respiratory: Positive for shortness of breath. Negative for cough, chest tightness, wheezing and stridor.   Cardiovascular: Negative for chest pain, palpitations and leg swelling.  Gastrointestinal: Negative for nausea, vomiting, abdominal pain, diarrhea, constipation, blood in stool, abdominal distention and anal bleeding.  Genitourinary: Negative for dysuria, hematuria, flank pain and difficulty urinating.  Musculoskeletal: Negative for myalgias,  back pain, joint swelling, arthralgias and gait problem.  Skin: Positive for wound. Negative for color change, pallor and rash.  Neurological: Positive for weakness. Negative for dizziness, tremors and light-headedness.  Hematological: Negative for adenopathy. Does not bruise/bleed easily.  Psychiatric/Behavioral: Negative for behavioral problems, confusion, sleep disturbance, dysphoric mood, decreased concentration and agitation.       Objective:   Physical Exam  Constitutional: He is oriented to person, place, and time. He appears well-developed and well-nourished. No distress.  HENT:  Head: Normocephalic and atraumatic.  Mouth/Throat: Oropharynx is clear and moist. No oropharyngeal exudate.  Eyes: Conjunctivae and EOM are normal. Pupils are equal, round, and reactive to light. No scleral icterus.  Neck: Normal range of motion. Neck supple. No JVD present.  Cardiovascular: Normal rate, regular rhythm and normal heart sounds.  Exam reveals no gallop and no friction rub.   No murmur heard. Pulmonary/Chest: Effort normal. No respiratory distress. He has decreased breath sounds in the right lower field. He has no wheezes. He has no rales. He exhibits no tenderness.    Abdominal: He exhibits no distension and no mass. There is no tenderness. There is no rebound and no guarding.  Musculoskeletal: He exhibits no edema and no tenderness.  Lymphadenopathy:    He has no cervical adenopathy.  Neurological: He is alert and oriented to person, place, and time. He has normal reflexes. He exhibits normal muscle tone. Coordination normal.  Skin: Skin is warm and dry. He is not diaphoretic. No erythema. No pallor.  Psychiatric: He has a normal mood and affect. His behavior is normal. Judgment and thought content normal.          Assessment & Plan:  Parapneumonic effusion I am comfortable taking  him off of his antibiotics and observing him. He will get a repeat CT scan in June. He'll followup in  infectious disease clinic as needed.  Bacteremia due to Streptococcus pneumoniae Resolved   Cyst of skin Resolved but he should see an dentist to make sure that any of odontogenic sources of infection a been addressed.

## 2012-05-22 ENCOUNTER — Other Ambulatory Visit: Payer: Self-pay | Admitting: Thoracic Surgery (Cardiothoracic Vascular Surgery)

## 2012-05-22 DIAGNOSIS — J869 Pyothorax without fistula: Secondary | ICD-10-CM

## 2012-05-24 LAB — AFB CULTURE WITH SMEAR (NOT AT ARMC): Acid Fast Smear: NONE SEEN

## 2012-05-26 ENCOUNTER — Encounter: Payer: Self-pay | Admitting: Thoracic Surgery (Cardiothoracic Vascular Surgery)

## 2012-05-26 ENCOUNTER — Ambulatory Visit (INDEPENDENT_AMBULATORY_CARE_PROVIDER_SITE_OTHER): Payer: Self-pay | Admitting: Thoracic Surgery (Cardiothoracic Vascular Surgery)

## 2012-05-26 ENCOUNTER — Ambulatory Visit
Admission: RE | Admit: 2012-05-26 | Discharge: 2012-05-26 | Disposition: A | Discharge status: Home / Self Care | Payer: Managed Care, Other (non HMO) | Source: Ambulatory Visit | Attending: Thoracic Surgery (Cardiothoracic Vascular Surgery) | Admitting: Thoracic Surgery (Cardiothoracic Vascular Surgery)

## 2012-05-26 VITALS — BP 140/85 | HR 101 | Resp 16 | Ht 74.0 in | Wt 194.0 lb

## 2012-05-26 DIAGNOSIS — J90 Pleural effusion, not elsewhere classified: Secondary | ICD-10-CM

## 2012-05-26 DIAGNOSIS — Z09 Encounter for follow-up examination after completed treatment for conditions other than malignant neoplasm: Secondary | ICD-10-CM

## 2012-05-26 DIAGNOSIS — J869 Pyothorax without fistula: Secondary | ICD-10-CM

## 2012-05-26 NOTE — Progress Notes (Signed)
  HPI:  [name] presents for a scheduled followup visit. He had a decortication for streptococcal empyema on April 18. He was seen in the office 3 weeks ago for his first postoperative followup visit. He was doing well clinically at that time, but he still did not feel well. Since that visit he has continued to improve. He completed his course of antibiotics on Friday. He's not had any fevers or chills. He did see his primary care doctor and was still relatively anemic. He does complain getting short of breath when he walks upstairs. He still has some incisional pain if he sneezes or laughs.  Past Medical History  Diagnosis Date  . Diabetes mellitus   . Pneumonia 02/2012    Strep pneumoniae bilateral pneumonia complicated by bacteremia  . Bacteremia      Current Outpatient Prescriptions  Medication Sig Dispense Refill  . aspirin 81 MG tablet Take 81 mg by mouth every morning.       . ferrous sulfate 325 (65 FE) MG tablet Take 1 tablet (325 mg total) by mouth 3 (three) times daily with meals.  90 tablet  0  . insulin aspart (NOVOLOG) 100 UNIT/ML injection Inject 11 Units into the skin 3 (three) times daily before meals. Based on sliding scale. Pt took 16 units today at 1600.      Marland Kitchen insulin glargine (LANTUS) 100 UNIT/ML injection Inject 40 Units into the skin at bedtime.      . rosuvastatin (CRESTOR) 10 MG tablet Take 10 mg by mouth every morning.         Physical Exam BP 140/85  Pulse 101  Resp 16  Ht 6\' 2"  (1.88 m)  Wt 194 lb (87.998 kg)  BMI 24.91 kg/m2  SpO2 98% Gen. 46 year old gentleman in no acute distress Lungs very mildly diminished breath sounds at the right base Incisions healing well  Diagnostic Tests: Chest x-ray shows markedly improved aeration of the right base with continued improvement of his effusion and atelectasis/infiltrate  Impression: 46 year old gentleman now about 6 weeks out from decortication. He continues to improve. He is off antibiotics and  afebrile. He has a repeat CT of the chest scheduled for June 14, and and sees Dr. Lamonte [name] on June 18. He also has followup scheduled with Dr. Tommy [name]. At this point his activities are unrestricted but he was cautioned that it would take him time to get over the illness and surgery.  Plan: I will be happy to see him back any time in the future that can be of any further assistance with his care.

## 2012-06-04 ENCOUNTER — Other Ambulatory Visit: Payer: Self-pay | Admitting: Emergency Medicine

## 2012-06-04 DIAGNOSIS — J13 Pneumonia due to Streptococcus pneumoniae: Secondary | ICD-10-CM

## 2012-06-05 ENCOUNTER — Other Ambulatory Visit (INDEPENDENT_AMBULATORY_CARE_PROVIDER_SITE_OTHER): Payer: Managed Care, Other (non HMO)

## 2012-06-05 ENCOUNTER — Ambulatory Visit (INDEPENDENT_AMBULATORY_CARE_PROVIDER_SITE_OTHER)
Admission: RE | Admit: 2012-06-05 | Discharge: 2012-06-05 | Disposition: A | Discharge status: Home / Self Care | Payer: Managed Care, Other (non HMO) | Source: Ambulatory Visit | Attending: Emergency Medicine | Admitting: Emergency Medicine

## 2012-06-05 ENCOUNTER — Telehealth: Payer: Self-pay | Admitting: *Deleted

## 2012-06-05 DIAGNOSIS — J13 Pneumonia due to Streptococcus pneumoniae: Secondary | ICD-10-CM

## 2012-06-05 LAB — BASIC METABOLIC PANEL
BUN: 37 mg/dL — ABNORMAL HIGH (ref 6–23)
CO2: 28 mEq/L (ref 19–32)
Calcium: 9.1 mg/dL (ref 8.4–10.5)
Chloride: 107 mEq/L (ref 96–112)
Creatinine, Ser: 1.6 mg/dL — ABNORMAL HIGH (ref 0.4–1.5)
GFR: 59.78 mL/min — ABNORMAL LOW (ref >60.00)
Glucose, Bld: 173 mg/dL — ABNORMAL HIGH (ref 70–99)
Potassium: 4.6 mEq/L (ref 3.5–5.1)
Sodium: 141 mEq/L (ref 135–145)

## 2012-06-05 MED ORDER — IOHEXOL 300 MG/ML  SOLN
80.0000 mL | Freq: Once | INTRAMUSCULAR | Status: AC | PRN
  Administered 2012-06-05: 80 mL via INTRAVENOUS

## 2012-06-05 NOTE — Telephone Encounter (Signed)
Thank you for letting me know. i will discuss with him, probably repeat it, when I see him in office

## 2012-06-05 NOTE — Telephone Encounter (Signed)
Rose called and she wanted to make RB aware that pt's creatinine level is rising. Last month it was 1.4 and today it was 1.6. I advised will make RB aware of this. Please advise RB thanks

## 2012-06-09 ENCOUNTER — Encounter: Payer: Self-pay | Admitting: Emergency Medicine

## 2012-06-09 ENCOUNTER — Ambulatory Visit (INDEPENDENT_AMBULATORY_CARE_PROVIDER_SITE_OTHER): Payer: Managed Care, Other (non HMO) | Admitting: Emergency Medicine

## 2012-06-09 VITALS — BP 146/90 | HR 98 | Temp 97.7°F | Ht 74.0 in | Wt 189.0 lb

## 2012-06-09 DIAGNOSIS — R42 Dizziness and giddiness: Secondary | ICD-10-CM

## 2012-06-09 DIAGNOSIS — J189 Pneumonia, unspecified organism: Secondary | ICD-10-CM

## 2012-06-09 DIAGNOSIS — J90 Pleural effusion, not elsewhere classified: Secondary | ICD-10-CM

## 2012-06-09 DIAGNOSIS — R0989 Other specified symptoms and signs involving the circulatory and respiratory systems: Secondary | ICD-10-CM

## 2012-06-09 DIAGNOSIS — R0609 Other forms of dyspnea: Secondary | ICD-10-CM

## 2012-06-09 DIAGNOSIS — R0600 Dyspnea, unspecified: Secondary | ICD-10-CM | POA: Insufficient documentation

## 2012-06-09 NOTE — Assessment & Plan Note (Signed)
?   Due to deconditioning, +/- his recent anemia. His effusion is better.  - check walking oximetry today - full pft - rov next available

## 2012-06-09 NOTE — Assessment & Plan Note (Signed)
Etiology unclear but he has had trouble going from sitting to standing, ? orthostasis - his BP regimen was recently adjusted. May need further w/u. If no evidence hypoxemia, will defer to Dr Nolon Rod.

## 2012-06-09 NOTE — Progress Notes (Signed)
  Subjective:    Patient ID: [name] [name], male    DOB: 1966/09/15, 46 y.o.   MRN: [id number] HPI 46 yo man, never smoker, hx HTN, DM, hyperlipidemia. I saw him when admitted for RLL pneumococcal PNA, then when he was readmitted for parapneumonic effusion +/- empyema. He underwent successful VATS decortication by Dr Roxan Hockey in 04/09/12. Course c/b anemia, required endoscopy/CSY.  He has been followed by Dr Drucilla Schmidt, is taking abx and will stay on levaquin for 2 more weeks. He is having some exertional SOB after climbing stairs. Having some R sided pain.   ROV 06/09/12 -- never smoker, hx HTN, DM, hyperlipidemia. I saw him when admitted for RLL pneumococcal PNA, required VATS decortication. Follows up after repeat CT scan 6/14 - shows resolution, some residual RLL scar. Tells me he has been dealing with dizziness, some DOE when climbing stairs.  Anemia improved on last CBC.       Objective:   Physical Exam Filed Vitals:   06/09/12 1409  BP: 146/90  Pulse: 98  Temp: 97.7 F (36.5 C)    Gen: Pleasant, well-nourished, in no distress,  normal affect  ENT: No lesions,  mouth clear,  oropharynx clear, no postnasal drip  Neck: No JVD, no TMG, no carotid bruits  Lungs: No use of accessory muscles, no dullness to percussion, clear without rales or rhonchi;   Cardiovascular: RRR, heart sounds normal, no murmur or gallops, no peripheral edema  Musculoskeletal: No deformities, no cyanosis or clubbing  Neuro: alert, non focal  Skin: Warm, no lesions or rashes  CBC    Component Value Date/Time   WBC 9.7 04/23/2012 1433   RBC 3.52* 04/23/2012 1433   HGB 9.7* 04/23/2012 1433   HCT 30.9* 04/23/2012 1433   PLT 358 04/23/2012 1433   MCV 87.8 04/23/2012 1433   MCH 27.6 04/23/2012 1433   MCHC 31.4 04/23/2012 1433   RDW 16.1* 04/23/2012 1433   LYMPHSABS 2.0 04/23/2012 1433   MONOABS 0.6 04/23/2012 1433   EOSABS 0.5 04/23/2012 1433   BASOSABS 0.1 04/23/2012 1433    CT scan 06/05/12 --  Comparison:  04/07/2012  Findings: No axillary or supraclavicular adenopathy.  There is no enlarged mediastinal or hilar adenopathy. There has  been decrease in volume of the pericardial effusion. Near complete  resolution of bilateral pleural effusions.  Atelectasis, scarring and small peripheral area of consolidation  within the right lower lobe is identified on today's exam  compatible with resolving pneumonia. No airway mass identified.  Review of the visualized osseous structures is significant for mild  thoracic spondylosis.  IMPRESSION:  1. Near complete resolution of bilateral pleural effusions and  decrease in volume of pericardial effusion.  2. Residual atelectasis, scarring and peripheral consolidation  within the right lower lobe. Findings compatible with resolving  infection.      Assessment & Plan:  Parapneumonic effusion Resolved by CT scan 6/14.   Dyspnea ? Due to deconditioning, +/- his recent anemia. His effusion is better.  - check walking oximetry today - full pft - rov next available  Dizziness Etiology unclear but he has had trouble going from sitting to standing, ? orthostasis - his BP regimen was recently adjusted. May need further w/u. If no evidence hypoxemia, will defer to Dr Nolon Rod.

## 2012-06-09 NOTE — Assessment & Plan Note (Addendum)
Resolved by CT scan 6/14.

## 2012-06-09 NOTE — Patient Instructions (Addendum)
Your CT scan is improved compared with your hospitalization We will perform walking oximetry today Follow up next available with full PFT's

## 2012-07-13 ENCOUNTER — Ambulatory Visit (INDEPENDENT_AMBULATORY_CARE_PROVIDER_SITE_OTHER): Payer: Managed Care, Other (non HMO) | Admitting: Emergency Medicine

## 2012-07-13 ENCOUNTER — Encounter: Payer: Self-pay | Admitting: *Deleted

## 2012-07-13 DIAGNOSIS — R0600 Dyspnea, unspecified: Secondary | ICD-10-CM

## 2012-07-13 DIAGNOSIS — R0989 Other specified symptoms and signs involving the circulatory and respiratory systems: Secondary | ICD-10-CM

## 2012-07-13 DIAGNOSIS — R0609 Other forms of dyspnea: Secondary | ICD-10-CM

## 2012-07-13 LAB — PULMONARY FUNCTION TEST

## 2012-07-13 NOTE — Progress Notes (Signed)
PFT Done. Jennifer Castillo, CMA  

## 2012-07-14 ENCOUNTER — Telehealth: Payer: Self-pay | Admitting: *Deleted

## 2012-07-14 ENCOUNTER — Encounter: Payer: Self-pay | Admitting: Emergency Medicine

## 2012-07-14 ENCOUNTER — Ambulatory Visit (INDEPENDENT_AMBULATORY_CARE_PROVIDER_SITE_OTHER): Payer: Managed Care, Other (non HMO) | Admitting: Emergency Medicine

## 2012-07-14 VITALS — BP 118/78 | HR 101 | Temp 98.4°F | Ht 74.0 in | Wt 192.0 lb

## 2012-07-14 DIAGNOSIS — J13 Pneumonia due to Streptococcus pneumoniae: Secondary | ICD-10-CM

## 2012-07-14 NOTE — Patient Instructions (Addendum)
Follow with Dr Lamonte Sakai in 1 year or sooner if your breathing changes in any way.

## 2012-07-14 NOTE — Assessment & Plan Note (Signed)
With regard to his pneumococcal PNA, [name] [name] has recovered well. He has lost about 10% of his predicted TLC presumably related to his pleurodesis and scarring. He could resume work from this standpoint, but is still dealing with imbalance and dizziness that has been present since [name] from the hospital. I'll defer to Dr Nolon Rod as to when he is fully ready to go back to work. His PFT do not support COPD or asthma. We won't start BD's. I'll see him back in a year or sooner if he has any problems.

## 2012-07-14 NOTE — Telephone Encounter (Signed)
Pt seen today in office today with RB, Pt requested copy of office note and PFT; pt left without both. Pt was called and told that todays' office note were sent electronically to his PCP for his appt tomorrow and we could mail them to him for his own records if he still wanted them. Pt stated he still would like the office note printed (07/14/12) and mailed along with copy of 07/13/12 PFT.  Evangeline Gula, CMA

## 2012-07-14 NOTE — Progress Notes (Signed)
Subjective:    Patient ID: [name] [name], male    DOB: 1966/11/09, 46 y.o.   MRN: [id number] HPI 46 yo man, never smoker, hx HTN, DM, hyperlipidemia. I saw him when admitted for [name] [name], then when he was readmitted for parapneumonic effusion +/- empyema. He underwent successful VATS decortication by Dr [name] [name] in 04/09/12. Course c/b anemia, required endoscopy/CSY.  He has been followed by Dr [name] [name], is taking abx and will stay on levaquin for 2 more weeks. He is having some exertional SOB after climbing stairs. Having some R sided pain.   ROV 06/09/12 -- never smoker, hx HTN, DM, hyperlipidemia. I saw him when admitted for [name] [name], required VATS decortication. Follows up after repeat CT scan 6/14 - shows resolution, some residual [name] scar. Tells me he has been dealing with dizziness, some DOE when climbing stairs.  Anemia improved on last CBC.    ROV 07/14/12 -- follow up for dyspnea, recent [name] [name] that required decort for parapneumonic effusion. PFT today = restriction on spiro and volumes, decreased DLCO that corrects for Va. He teels me that he feels right sided discomfort with a deep breath. Since last time has remained dizzy. Hgb is coming up, being followed by Dr [name] [name]. Still dizzy - CT scan head reassuring. His exertional tolerance is pretty good.    PULMONARY FUNCTON TEST 07/13/2012  FVC 3.1  FEV1 2.73  FEV1/FVC 88.1  FVC  % Predicted 55  FEV % Predicted 67  FeF 25-75 2.73  FeF 25-75 % Predicted 3.97       Objective:   Physical Exam Filed Vitals:   07/14/12 1048  BP: 118/78  Pulse: 101  Temp: 98.4 F (36.9 C)   Gen: Pleasant, well-nourished, in no distress,  normal affect  ENT: No lesions,  mouth clear,  oropharynx clear, no postnasal drip  Neck: No JVD, no TMG, no carotid bruits  Lungs: No use of accessory muscles, no dullness to percussion, clear without rales or rhonchi;   Cardiovascular: RRR, heart sounds normal,  no murmur or gallops, no peripheral edema  Musculoskeletal: No deformities, no cyanosis or clubbing  Neuro: alert, non focal  Skin: Warm, no lesions or rashes  CBC    Component Value Date/Time   WBC 9.7 04/23/2012 1433   RBC 3.52* 04/23/2012 1433   HGB 9.7* 04/23/2012 1433   HCT 30.9* 04/23/2012 1433   PLT 358 04/23/2012 1433   MCV 87.8 04/23/2012 1433   MCH 27.6 04/23/2012 1433   MCHC 31.4 04/23/2012 1433   RDW 16.1* 04/23/2012 1433   LYMPHSABS 2.0 04/23/2012 1433   MONOABS 0.6 04/23/2012 1433   EOSABS 0.5 04/23/2012 1433   BASOSABS 0.1 04/23/2012 1433    CT scan 06/05/12 --  Comparison: 04/07/2012  Findings: No axillary or supraclavicular adenopathy.  There is no enlarged mediastinal or hilar adenopathy. There has  been decrease in volume of the pericardial effusion. Near complete  resolution of bilateral pleural effusions.  Atelectasis, scarring and small peripheral area of consolidation  within the right lower lobe is identified on today's exam  compatible with resolving pneumonia. No airway mass identified.  Review of the visualized osseous structures is significant for mild  thoracic spondylosis.  IMPRESSION:  1. Near complete resolution of bilateral pleural effusions and  decrease in volume of pericardial effusion.  2. Residual atelectasis, scarring and peripheral consolidation  within the right lower lobe. Findings compatible with resolving  infection.      Assessment &  Plan:  Pneumococcal pneumonia With regard to his pneumococcal [name], [name] has recovered well. He has lost about 10% of his predicted TLC presumably related to his pleurodesis and scarring. He could resume work from this standpoint, but is still dealing with imbalance and dizziness that has been present since d/c from the hospital. I'll defer to Dr [name] [name] as to when he is fully ready to go back to work. His PFT do not support COPD or asthma. We won't start BD's. I'll see him back in a year or sooner if he has any  problems.

## 2012-07-15 ENCOUNTER — Ambulatory Visit
Admission: RE | Admit: 2012-07-15 | Discharge: 2012-07-15 | Disposition: A | Discharge status: Home / Self Care | Payer: Managed Care, Other (non HMO) | Source: Ambulatory Visit | Attending: Family Medicine | Admitting: Family Medicine

## 2012-07-15 ENCOUNTER — Other Ambulatory Visit: Payer: Self-pay | Admitting: Family Medicine

## 2012-07-15 DIAGNOSIS — H5500 Unspecified nystagmus: Secondary | ICD-10-CM

## 2012-07-15 DIAGNOSIS — R42 Dizziness and giddiness: Secondary | ICD-10-CM

## 2012-07-16 ENCOUNTER — Other Ambulatory Visit: Payer: Managed Care, Other (non HMO)

## 2012-07-17 ENCOUNTER — Encounter: Payer: Self-pay | Admitting: Emergency Medicine

## 2012-08-21 ENCOUNTER — Ambulatory Visit
Payer: Managed Care, Other (non HMO) | Attending: Neurology | Admitting: Rehabilitative and Restorative Service Providers"

## 2012-08-21 DIAGNOSIS — IMO0001 Reserved for inherently not codable concepts without codable children: Secondary | ICD-10-CM | POA: Insufficient documentation

## 2012-08-21 DIAGNOSIS — R42 Dizziness and giddiness: Secondary | ICD-10-CM | POA: Insufficient documentation

## 2012-08-21 DIAGNOSIS — R269 Unspecified abnormalities of gait and mobility: Secondary | ICD-10-CM | POA: Insufficient documentation

## 2012-08-31 ENCOUNTER — Ambulatory Visit
Payer: Managed Care, Other (non HMO) | Attending: Neurology | Admitting: Rehabilitative and Restorative Service Providers"

## 2012-08-31 DIAGNOSIS — IMO0001 Reserved for inherently not codable concepts without codable children: Secondary | ICD-10-CM | POA: Insufficient documentation

## 2012-08-31 DIAGNOSIS — R42 Dizziness and giddiness: Secondary | ICD-10-CM | POA: Insufficient documentation

## 2012-08-31 DIAGNOSIS — R269 Unspecified abnormalities of gait and mobility: Secondary | ICD-10-CM | POA: Insufficient documentation

## 2012-09-02 ENCOUNTER — Encounter: Payer: Managed Care, Other (non HMO) | Admitting: Rehabilitative and Restorative Service Providers"

## 2012-09-07 ENCOUNTER — Encounter: Payer: Managed Care, Other (non HMO) | Admitting: Rehabilitative and Restorative Service Providers"

## 2012-09-09 ENCOUNTER — Encounter: Payer: Managed Care, Other (non HMO) | Admitting: Rehabilitative and Restorative Service Providers"

## 2012-09-14 ENCOUNTER — Encounter: Payer: Managed Care, Other (non HMO) | Admitting: Rehabilitative and Restorative Service Providers"

## 2012-10-19 ENCOUNTER — Ambulatory Visit (INDEPENDENT_AMBULATORY_CARE_PROVIDER_SITE_OTHER): Payer: Managed Care, Other (non HMO) | Admitting: Family Medicine

## 2012-10-19 DIAGNOSIS — Z23 Encounter for immunization: Secondary | ICD-10-CM

## 2013-02-02 ENCOUNTER — Ambulatory Visit (HOSPITAL_COMMUNITY)
Admission: RE | Admit: 2013-02-02 | Discharge: 2013-02-02 | Disposition: A | Discharge status: Home / Self Care | Payer: Managed Care, Other (non HMO) | Source: Ambulatory Visit | Attending: General Surgery | Admitting: General Surgery

## 2013-02-02 ENCOUNTER — Other Ambulatory Visit (HOSPITAL_BASED_OUTPATIENT_CLINIC_OR_DEPARTMENT_OTHER): Payer: Self-pay | Admitting: General Surgery

## 2013-02-02 ENCOUNTER — Encounter (HOSPITAL_BASED_OUTPATIENT_CLINIC_OR_DEPARTMENT_OTHER): Payer: Managed Care, Other (non HMO) | Attending: General Surgery

## 2013-02-02 DIAGNOSIS — E1169 Type 2 diabetes mellitus with other specified complication: Secondary | ICD-10-CM | POA: Insufficient documentation

## 2013-02-02 DIAGNOSIS — L97509 Non-pressure chronic ulcer of other part of unspecified foot with unspecified severity: Secondary | ICD-10-CM | POA: Insufficient documentation

## 2013-02-02 DIAGNOSIS — M79673 Pain in unspecified foot: Secondary | ICD-10-CM

## 2013-02-02 DIAGNOSIS — X58XXXA Exposure to other specified factors, initial encounter: Secondary | ICD-10-CM | POA: Insufficient documentation

## 2013-02-02 DIAGNOSIS — N529 Male erectile dysfunction, unspecified: Secondary | ICD-10-CM | POA: Insufficient documentation

## 2013-02-02 DIAGNOSIS — Z794 Long term (current) use of insulin: Secondary | ICD-10-CM | POA: Insufficient documentation

## 2013-02-02 DIAGNOSIS — M79609 Pain in unspecified limb: Secondary | ICD-10-CM | POA: Insufficient documentation

## 2013-02-02 DIAGNOSIS — S91309A Unspecified open wound, unspecified foot, initial encounter: Secondary | ICD-10-CM | POA: Insufficient documentation

## 2013-02-02 DIAGNOSIS — E785 Hyperlipidemia, unspecified: Secondary | ICD-10-CM | POA: Insufficient documentation

## 2013-02-02 DIAGNOSIS — Z79899 Other long term (current) drug therapy: Secondary | ICD-10-CM | POA: Insufficient documentation

## 2013-02-02 DIAGNOSIS — D649 Anemia, unspecified: Secondary | ICD-10-CM | POA: Insufficient documentation

## 2013-02-02 DIAGNOSIS — G5790 Unspecified mononeuropathy of unspecified lower limb: Secondary | ICD-10-CM | POA: Insufficient documentation

## 2013-02-02 NOTE — H&P (Signed)
NAMEMANDELL, LEEB NO.:  [id number]  MEDICAL RECORD NO.:  [id number]  LOCATION:  FOOT                         FACILITY:  [facility]  PHYSICIAN:  Elesa Hacker, M.D.        DATE OF BIRTH:  15-Oct-1966  DATE OF ADMISSION:  02/02/2013 DATE OF DISCHARGE:                             HISTORY & PHYSICAL   CHIEF COMPLAINT:  Wound, right foot.  HISTORY OF THE PRESENT ILLNESS:  Approximately 2-1/2 weeks ago, the patient took a long walk in the woods.  He was wearing sneakers.  He developed a blister.  This was associated with cellulitis which was treated with various antibiotics and this was eventuated into a foot ulcer in the right lateral plantar area.  PAST MEDICAL HISTORY:  Significant for diabetes mellitus for several years.  This was associated with loss of sensation.  He is now on insulin.  He also has anemia of unknown cause, which his medical doctor wants to workup after the ulcer is healed.  He has hyperlipidemia, nystagmus, dizziness, history of impotence, pneumonia, and streptococcal bacteremia.  He had a surgical procedure drain to drain the thorax in 2013.  SOCIAL HISTORY:  Cigarettes none.  Alcohol none.  ALLERGIES:  Penicillin allergy.  MEDICATIONS:  Ferrous sulfate, meclizine, promethazine, Septra, Cialis, simvastatin, and losartan.  REVIEW OF SYSTEMS:  As above.  LABORATORY DATA:  Note that laboratory work does show an elevated BUN and slightly elevated creatinine in addition to other problems.  PHYSICAL EXAMINATION:  VITAL SIGNS Temperature 98.2, pulse 88, respirations 18, blood pressure 137/87, glucose 149.  GENERAL APPEARANCE Well developed, somewhat slender, in no distress.  HEENT Head, eyes, ears, throat normal.  CHEST Clear.  HEART Regular rhythm.  ABDOMEN Not examined.  EXTREMITIES Examination of extremities revealed an ABI of 1.23.  There is a bounding pulse.  There is profound neuropathy with loss of sensation on the forefoot  both feet.  At the right lateral foot, there is a 2.4 x 1.8 superficial wound just lateral and under the fifth toe and metatarsal head.  IMPRESSION:  Diabetic foot ulcer, Wagner III.  We will start treatment with Santyl and Hydrogel and Darco boot for offloading and we will see him in 7 days.  I have ordered x-rays of the toe to rule out osteomyelitis.     Elesa Hacker, M.D.     RA/MEDQ  D:  02/02/2013  T:  02/02/2013  Job:  [id number]

## 2013-02-23 ENCOUNTER — Encounter (HOSPITAL_BASED_OUTPATIENT_CLINIC_OR_DEPARTMENT_OTHER): Payer: Managed Care, Other (non HMO) | Attending: General Surgery

## 2013-02-23 DIAGNOSIS — E1169 Type 2 diabetes mellitus with other specified complication: Secondary | ICD-10-CM | POA: Insufficient documentation

## 2013-02-23 DIAGNOSIS — L97509 Non-pressure chronic ulcer of other part of unspecified foot with unspecified severity: Secondary | ICD-10-CM | POA: Insufficient documentation

## 2013-03-16 ENCOUNTER — Encounter (HOSPITAL_COMMUNITY): Payer: Self-pay | Admitting: *Deleted

## 2013-03-16 ENCOUNTER — Emergency Department (HOSPITAL_COMMUNITY): Payer: Managed Care, Other (non HMO)

## 2013-03-16 ENCOUNTER — Emergency Department (HOSPITAL_COMMUNITY)
Admission: EM | Admit: 2013-03-16 | Discharge: 2013-03-16 | Disposition: A | Discharge status: Home / Self Care | Payer: Managed Care, Other (non HMO) | Attending: Emergency Medicine | Admitting: Emergency Medicine

## 2013-03-16 DIAGNOSIS — Z794 Long term (current) use of insulin: Secondary | ICD-10-CM | POA: Insufficient documentation

## 2013-03-16 DIAGNOSIS — L0390 Cellulitis, unspecified: Secondary | ICD-10-CM

## 2013-03-16 DIAGNOSIS — Z8701 Personal history of pneumonia (recurrent): Secondary | ICD-10-CM | POA: Insufficient documentation

## 2013-03-16 DIAGNOSIS — Z7982 Long term (current) use of aspirin: Secondary | ICD-10-CM | POA: Insufficient documentation

## 2013-03-16 DIAGNOSIS — L03119 Cellulitis of unspecified part of limb: Secondary | ICD-10-CM | POA: Insufficient documentation

## 2013-03-16 DIAGNOSIS — E119 Type 2 diabetes mellitus without complications: Secondary | ICD-10-CM | POA: Insufficient documentation

## 2013-03-16 DIAGNOSIS — L02619 Cutaneous abscess of unspecified foot: Secondary | ICD-10-CM | POA: Insufficient documentation

## 2013-03-16 DIAGNOSIS — Z79899 Other long term (current) drug therapy: Secondary | ICD-10-CM | POA: Insufficient documentation

## 2013-03-16 DIAGNOSIS — R52 Pain, unspecified: Secondary | ICD-10-CM

## 2013-03-16 MED ORDER — VANCOMYCIN HCL IN DEXTROSE 1-5 GM/200ML-% IV SOLN
1000.0000 mg | Freq: Once | INTRAVENOUS | Status: AC
  Administered 2013-03-16: 1000 mg via INTRAVENOUS
  Filled 2013-03-16: qty 200

## 2013-03-16 MED ORDER — GADOBENATE DIMEGLUMINE 529 MG/ML IV SOLN
20.0000 mL | Freq: Once | INTRAVENOUS | Status: AC | PRN
  Administered 2013-03-16: 18 mL via INTRAVENOUS

## 2013-03-16 MED ORDER — DOXYCYCLINE HYCLATE 100 MG PO CAPS: 100.0000 mg | ORAL_CAPSULE | Freq: Two times a day (BID) | ORAL | Status: DC

## 2013-03-16 NOTE — ED Notes (Signed)
Patient transported to MRI 

## 2013-03-16 NOTE — Progress Notes (Signed)
*  PRELIMINARY RESULTS* Vascular Ultrasound Right lower extremity venous duplex has been completed.  Preliminary findings: Right:  No evidence of DVT, superficial thrombosis, or Baker's cyst.   Landry Mellow, RDMS, RVT  03/16/2013, 10:20 AM

## 2013-03-16 NOTE — ED Notes (Signed)
Vascular at bedside

## 2013-03-16 NOTE — ED Notes (Addendum)
Pt hx of diabetes. Has a right lateral foot wound. Sent from wound care today to evaluate and possibly rule out DVT.   Pt reports he got would to lateral part of right foot near pinkie toe on super bowel weekend. Pt was referred to wound care 1-2 weeks later. Was started on santil and report wound was healing and getting smaller. Pt was also given a small foot boot to wear. Pt reports he had a very physical standing job. Pt reports 2 weeks ago santil was discontinued and wound care doctor changed treatment to a collagen. 2 weeks ago right calf pain began with skin discoloration. Pain at present 2/10. Pt thinks pain could be related to physicalness of job.   Pt reports last year at sometime he was hospitalized at [facility] for 1 month and says he refuses to be admitted this time and "will get up and walk out of here." pt very anxious about not being hospitalized.

## 2013-03-16 NOTE — ED Notes (Addendum)
MRI reports will be about 1 hour before pt goes to MRI  Pt made aware

## 2013-03-16 NOTE — ED Notes (Signed)
Pt given pillow and lights turned off, pt resting waiting for MRI.

## 2013-03-19 ENCOUNTER — Ambulatory Visit (HOSPITAL_COMMUNITY): Payer: Managed Care, Other (non HMO)

## 2013-03-23 ENCOUNTER — Encounter (HOSPITAL_BASED_OUTPATIENT_CLINIC_OR_DEPARTMENT_OTHER): Payer: Managed Care, Other (non HMO) | Attending: General Surgery

## 2013-03-23 DIAGNOSIS — E1169 Type 2 diabetes mellitus with other specified complication: Secondary | ICD-10-CM | POA: Insufficient documentation

## 2013-03-23 DIAGNOSIS — L97509 Non-pressure chronic ulcer of other part of unspecified foot with unspecified severity: Secondary | ICD-10-CM | POA: Insufficient documentation

## 2013-03-23 DIAGNOSIS — L84 Corns and callosities: Secondary | ICD-10-CM | POA: Insufficient documentation

## 2013-03-24 NOTE — ED Provider Notes (Signed)
History    46yM sent to ED for eval for possible DVT. Followed in wound care center for wound to R foot. Has been having increased swelling in foot and leg over past week. Denies new trauma. No fever or chills. No n/v. Diabetic.   CSN: [id number]  Arrival date & time 03/16/13  0917   First MD Initiated Contact with Patient 03/16/13 [phone]      Chief Complaint  Patient presents with  . sent from wound center, rule out DVT     (Consider location/radiation/quality/duration/timing/severity/associated sxs/prior treatment) HPI  Past Medical History  Diagnosis Date  . Diabetes mellitus   . Pneumonia 02/2012    Strep pneumoniae bilateral pneumonia complicated by bacteremia  . Bacteremia     Past Surgical History  Procedure Laterality Date  . Esophagogastroduodenoscopy  03/31/2012    Procedure: ESOPHAGOGASTRODUODENOSCOPY (EGD);  Surgeon: Beryle Beams, MD;  Location: [facility] ENDOSCOPY;  Service: Endoscopy;  Laterality: N/A;  . Colonoscopy  04/01/2012    Procedure: COLONOSCOPY;  Surgeon: Juanita Craver, MD;  Location: [facility] ENDOSCOPY;  Service: Endoscopy;  Laterality: N/A;  . Chest tube insertion      placed during hospitalization 02/2012    Family History  Problem Relation Age of Onset  . Diabetes Maternal Grandmother     History  Substance Use Topics  . Smoking status: Never Smoker   . Smokeless tobacco: Never Used  . Alcohol Use: Yes     Comment: rare ETOH use      Review of Systems  All systems reviewed and negative, other than as noted in HPI.   Allergies  Penicillins  Home Medications   Current Outpatient Rx  Name  Route  Sig  Dispense  Refill  . aspirin 81 MG tablet   Oral   Take 81 mg by mouth every morning.          Marland Kitchen EXPIRED: insulin aspart (NOVOLOG) 100 UNIT/ML injection   Subcutaneous   Inject 11 Units into the skin 3 (three) times daily before meals. Based on sliding scale.         . insulin glargine (LANTUS) 100 UNIT/ML injection   Subcutaneous   Inject  40 Units into the skin at bedtime.         Marland Kitchen olmesartan (BENICAR) 20 MG tablet   Oral   Take 20 mg by mouth daily.         . rosuvastatin (CRESTOR) 10 MG tablet   Oral   Take 10 mg by mouth every morning.          Marland Kitchen doxycycline (VIBRAMYCIN) 100 MG capsule   Oral   Take 1 capsule (100 mg total) by mouth 2 (two) times daily.   20 capsule   0     BP 169/90  Pulse 87  Temp(Src) 97.5 F (36.4 C) (Oral)  Resp 16  SpO2 97%  Physical Exam  Nursing note and vitals reviewed. Constitutional: He appears well-developed and well-nourished. No distress.  HENT:  Head: Normocephalic and atraumatic.  Eyes: Conjunctivae are normal. Right eye exhibits no discharge. Left eye exhibits no discharge.  Neck: Neck supple.  Cardiovascular: Normal rate, regular rhythm and normal heart sounds.  Exam reveals no gallop and no friction rub.   No murmur heard. Pulmonary/Chest: Effort normal and breath sounds normal. No respiratory distress.  Abdominal: Soft. He exhibits no distension. There is no tenderness.  Musculoskeletal: He exhibits no edema and no tenderness.  R foot orthotic. Edema of R leg as compared  to L. NO increased warmth or tenderness.   Neurological: He is alert.  Skin: Skin is warm and dry.  Psychiatric: He has a normal mood and affect. His behavior is normal. Thought content normal.    ED Course  Procedures (including critical care time)  Labs Reviewed - No data to display No results found.   1. Pain   2. Cellulitis       MDM  46yM with R foot swelling. Imaging neg for DVT, but MRI suggestive of cellulitis. Afebrile. No systemic symptoms. I feel appropriate for outpt tx at this time. Return precautions discussed. Continued FU in wound center.         Virgel Manifold, MD 03/24/13 1247

## 2013-04-29 ENCOUNTER — Encounter (HOSPITAL_BASED_OUTPATIENT_CLINIC_OR_DEPARTMENT_OTHER): Payer: Self-pay | Admitting: *Deleted

## 2013-04-29 ENCOUNTER — Emergency Department (HOSPITAL_BASED_OUTPATIENT_CLINIC_OR_DEPARTMENT_OTHER)
Admission: EM | Admit: 2013-04-29 | Discharge: 2013-04-29 | Disposition: A | Discharge status: Home / Self Care | Payer: Managed Care, Other (non HMO) | Attending: Emergency Medicine | Admitting: Emergency Medicine

## 2013-04-29 DIAGNOSIS — R7989 Other specified abnormal findings of blood chemistry: Secondary | ICD-10-CM | POA: Insufficient documentation

## 2013-04-29 DIAGNOSIS — Z7982 Long term (current) use of aspirin: Secondary | ICD-10-CM | POA: Insufficient documentation

## 2013-04-29 DIAGNOSIS — Z8701 Personal history of pneumonia (recurrent): Secondary | ICD-10-CM | POA: Insufficient documentation

## 2013-04-29 DIAGNOSIS — R252 Cramp and spasm: Secondary | ICD-10-CM

## 2013-04-29 DIAGNOSIS — I10 Essential (primary) hypertension: Secondary | ICD-10-CM | POA: Insufficient documentation

## 2013-04-29 DIAGNOSIS — Z79899 Other long term (current) drug therapy: Secondary | ICD-10-CM | POA: Insufficient documentation

## 2013-04-29 DIAGNOSIS — Z880 Allergy status to penicillin: Secondary | ICD-10-CM | POA: Insufficient documentation

## 2013-04-29 DIAGNOSIS — E860 Dehydration: Secondary | ICD-10-CM | POA: Insufficient documentation

## 2013-04-29 DIAGNOSIS — E119 Type 2 diabetes mellitus without complications: Secondary | ICD-10-CM | POA: Insufficient documentation

## 2013-04-29 DIAGNOSIS — Z8619 Personal history of other infectious and parasitic diseases: Secondary | ICD-10-CM | POA: Insufficient documentation

## 2013-04-29 HISTORY — DX: Essential (primary) hypertension: I10

## 2013-04-29 LAB — BASIC METABOLIC PANEL
BUN: 62 mg/dL — ABNORMAL HIGH (ref 6–23)
CO2: 24 mEq/L (ref 19–32)
Calcium: 9.4 mg/dL (ref 8.4–10.5)
Chloride: 101 mEq/L (ref 96–112)
Creatinine, Ser: 2 mg/dL — ABNORMAL HIGH (ref 0.50–1.35)
GFR calc Af Amer: 44 mL/min — ABNORMAL LOW (ref >90)
GFR calc non Af Amer: 38 mL/min — ABNORMAL LOW (ref >90)
Glucose, Bld: 240 mg/dL — ABNORMAL HIGH (ref 70–99)
Potassium: 4.1 mEq/L (ref 3.5–5.1)
Sodium: 136 mEq/L (ref 135–145)

## 2013-04-29 LAB — CBC WITH DIFFERENTIAL/PLATELET
Basophils Absolute: 0.1 10*3/uL (ref 0.0–0.1)
Basophils Relative: 1 % (ref 0–1)
Eosinophils Absolute: 0.2 10*3/uL (ref 0.0–0.7)
Eosinophils Relative: 2 % (ref 0–5)
HCT: 27.8 % — ABNORMAL LOW (ref 39.0–52.0)
Hemoglobin: 9.8 g/dL — ABNORMAL LOW (ref 13.0–17.0)
Lymphocytes Relative: 19 % (ref 12–46)
Lymphs Abs: 1.8 10*3/uL (ref 0.7–4.0)
MCH: 30 pg (ref 26.0–34.0)
MCHC: 35.3 g/dL (ref 30.0–36.0)
MCV: 85 fL (ref 78.0–100.0)
Monocytes Absolute: 0.6 10*3/uL (ref 0.1–1.0)
Monocytes Relative: 7 % (ref 3–12)
Neutro Abs: 7 10*3/uL (ref 1.7–7.7)
Neutrophils Relative %: 73 % (ref 43–77)
Platelets: 227 10*3/uL (ref 150–400)
RBC: 3.27 MIL/uL — ABNORMAL LOW (ref 4.22–5.81)
RDW: 11.4 % — ABNORMAL LOW (ref 11.5–15.5)
WBC: 9.6 10*3/uL (ref 4.0–10.5)

## 2013-04-29 LAB — GLUCOSE, CAPILLARY: Glucose-Capillary: 200 mg/dL — ABNORMAL HIGH (ref 70–99)

## 2013-04-29 MED ORDER — LORAZEPAM 2 MG/ML IJ SOLN
1.0000 mg | Freq: Once | INTRAMUSCULAR | Status: AC
  Administered 2013-04-29: 2 mg via INTRAVENOUS
  Filled 2013-04-29: qty 1

## 2013-04-29 MED ORDER — SODIUM CHLORIDE 0.9 % IV BOLUS (SEPSIS)
1000.0000 mL | Freq: Once | INTRAVENOUS | Status: AC
  Administered 2013-04-29: 1000 mL via INTRAVENOUS

## 2013-04-29 NOTE — ED Notes (Signed)
PA at bedside.

## 2013-04-29 NOTE — ED Notes (Signed)
MD at bedside. 

## 2013-04-29 NOTE — ED Notes (Signed)
Observe student get CBG from Pt

## 2013-04-29 NOTE — ED Notes (Signed)
Pt placed on heart monitor.

## 2013-04-29 NOTE — ED Notes (Addendum)
Brought in by ems from home c/o bil leg cramps.  Received  toradol 30mg  iv and fentanyl 258mcg iv PTA BOlus 500 ml

## 2013-04-29 NOTE — ED Provider Notes (Signed)
History     CSN: [id number]  Arrival date & time 04/29/13  1730   First MD Initiated Contact with Patient 04/29/13 1739      Chief Complaint  Patient presents with  . leg cramps     (Consider location/radiation/quality/duration/timing/severity/associated sxs/prior treatment) Patient is a 47 y.o. male presenting with leg pain. The history is provided by the patient. No language interpreter was used.  Leg Pain Location:  Leg Time since incident:  2 hours Leg location:  L leg, R leg, L lower leg, R lower leg, L upper leg and R upper leg Pain details:    Quality:  Cramping   Severity:  Severe   Onset quality:  Sudden   Timing:  Constant   Progression:  Worsening Chronicity:  New Foreign body present:  No foreign bodies Tetanus status:  Up to date Worsened by:  Activity Risk factors: no concern for non-accidental trauma   Pt complains of sudden onset of severe cramps in both legs.   Pt reports he could not get cramps to go away. EMS gave pt fentynl with some relief.   Pt reports he still feels achy like cramps are coming back  Past Medical History  Diagnosis Date  . Diabetes mellitus   . Pneumonia 02/2012    Strep pneumoniae bilateral pneumonia complicated by bacteremia  . Bacteremia   . Hypertension     Past Surgical History  Procedure Laterality Date  . Esophagogastroduodenoscopy  03/31/2012    Procedure: ESOPHAGOGASTRODUODENOSCOPY (EGD);  Surgeon: Beryle Beams, MD;  Location: [facility] ENDOSCOPY;  Service: Endoscopy;  Laterality: N/A;  . Colonoscopy  04/01/2012    Procedure: COLONOSCOPY;  Surgeon: Juanita Craver, MD;  Location: [facility] ENDOSCOPY;  Service: Endoscopy;  Laterality: N/A;  . Chest tube insertion      placed during hospitalization 02/2012    Family History  Problem Relation Age of Onset  . Diabetes Maternal Grandmother     History  Substance Use Topics  . Smoking status: Never Smoker   . Smokeless tobacco: Never Used  . Alcohol Use: Yes     Comment: rare ETOH use       Review of Systems  Musculoskeletal: Positive for myalgias.  All other systems reviewed and are negative.    Allergies  Penicillins  Home Medications   Current Outpatient Rx  Name  Route  Sig  Dispense  Refill  . aspirin 81 MG tablet   Oral   Take 81 mg by mouth every morning.          Marland Kitchen doxycycline (VIBRAMYCIN) 100 MG capsule   Oral   Take 1 capsule (100 mg total) by mouth 2 (two) times daily.   20 capsule   0   . EXPIRED: insulin aspart (NOVOLOG) 100 UNIT/ML injection   Subcutaneous   Inject 11 Units into the skin 3 (three) times daily before meals. Based on sliding scale.         Marland Kitchen EXPIRED: insulin glargine (LANTUS) 100 UNIT/ML injection   Subcutaneous   Inject 40 Units into the skin at bedtime.         Marland Kitchen olmesartan (BENICAR) 20 MG tablet   Oral   Take 20 mg by mouth daily.         . rosuvastatin (CRESTOR) 10 MG tablet   Oral   Take 10 mg by mouth every morning.            BP 114/67  Pulse 82  Temp(Src) 98.1 F (36.7 C) (  Oral)  Resp 20  Ht 6\' 2"  (1.88 m)  Wt 200 lb (90.719 kg)  BMI 25.67 kg/m2  SpO2 96%  Physical Exam  Nursing note and vitals reviewed. Constitutional: He is oriented to person, place, and time. He appears well-developed and well-nourished.  HENT:  Head: Normocephalic and atraumatic.  Right Ear: External ear normal.  Left Ear: External ear normal.  Nose: Nose normal.  Mouth/Throat: Oropharynx is clear and moist.  Eyes: Conjunctivae and EOM are normal. Pupils are equal, round, and reactive to light.  Neck: Normal range of motion. Neck supple.  Cardiovascular: Normal rate and normal heart sounds.   Pulmonary/Chest: Effort normal and breath sounds normal.  Abdominal: Soft. Bowel sounds are normal.  Musculoskeletal: Normal range of motion.  Neurological: He is alert and oriented to person, place, and time. He has normal reflexes.  Skin: Skin is warm.  Psychiatric: He has a normal mood and affect.    ED Course   Procedures (including critical care time)  Labs Reviewed  BASIC METABOLIC PANEL - Abnormal; Notable for the following:    Glucose, Bld 240 (*)    BUN 62 (*)    Creatinine, Ser 2.00 (*)    GFR calc non Af Amer 38 (*)    GFR calc Af Amer 44 (*)    All other components within normal limits  GLUCOSE, CAPILLARY - Abnormal; Notable for the following:    Glucose-Capillary 200 (*)    All other components within normal limits  CBC WITH DIFFERENTIAL - Abnormal; Notable for the following:    RBC 3.27 (*)    Hemoglobin 9.8 (*)    HCT 27.8 (*)    RDW 11.4 (*)    All other components within normal limits   No results found.   No diagnosis found.    MDM  Pt given Iv fluids x 2 liters.  Pt given ativan 1 mg.   Pt advised of elevated renal functions.   Pt has had slight elevations for past year.   Pt advised to see his Md for recheck in 1 week       Fransico Meadow, [place] 04/29/13 2021

## 2013-04-29 NOTE — ED Provider Notes (Signed)
Medical screening examination/treatment/procedure(s) were performed by non-physician practitioner and as supervising physician I was immediately available for consultation/collaboration.   Malvin Johns, MD 04/29/13 2252

## 2013-04-29 NOTE — ED Notes (Signed)
Vitals obtained by student

## 2013-06-11 ENCOUNTER — Telehealth: Payer: Self-pay | Admitting: Emergency Medicine

## 2013-06-11 NOTE — Telephone Encounter (Signed)
Called patient and left message x3. Sent letter 06/10/13.

## 2013-08-18 ENCOUNTER — Ambulatory Visit: Payer: Managed Care, Other (non HMO) | Admitting: Emergency Medicine

## 2013-09-16 ENCOUNTER — Ambulatory Visit: Payer: Managed Care, Other (non HMO) | Admitting: Emergency Medicine

## 2013-09-29 IMAGING — US US ABDOMEN COMPLETE
1 series · 14 of 25 positions shown · non-contrast
Comparison: Chest CT 03/11/2012 with incomplete visualization of
the liver

CLINICAL DATA: Right upper quadrant abdominal pain, elevated liver
function tests

ABDOMINAL ULTRASOUND COMPLETE

[Series 1: us abdomen complete · 0.28mm/px · 14 of 83 slices shown]
[im 1/83]
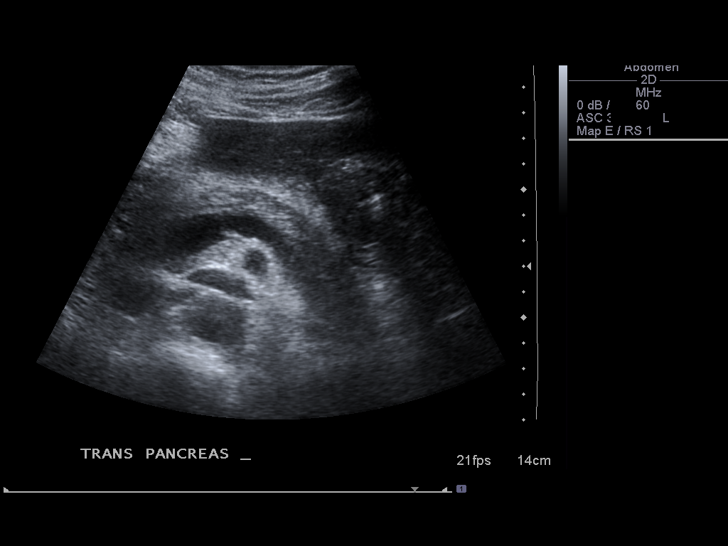
[im 7/83]
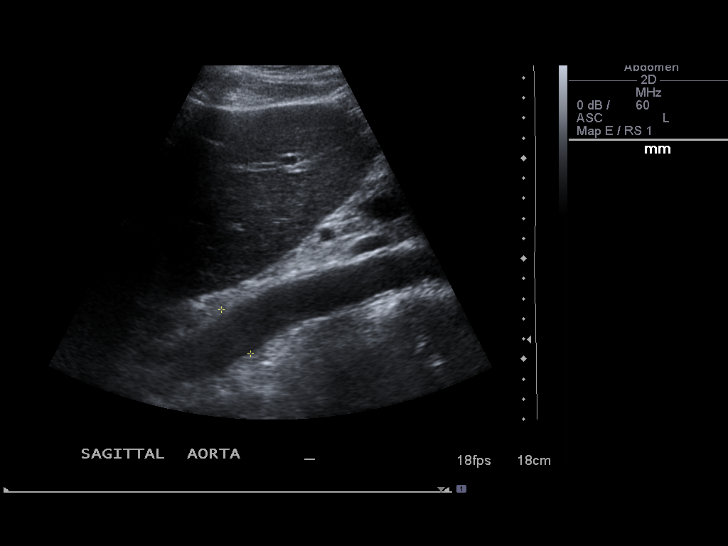
[im 14/83]
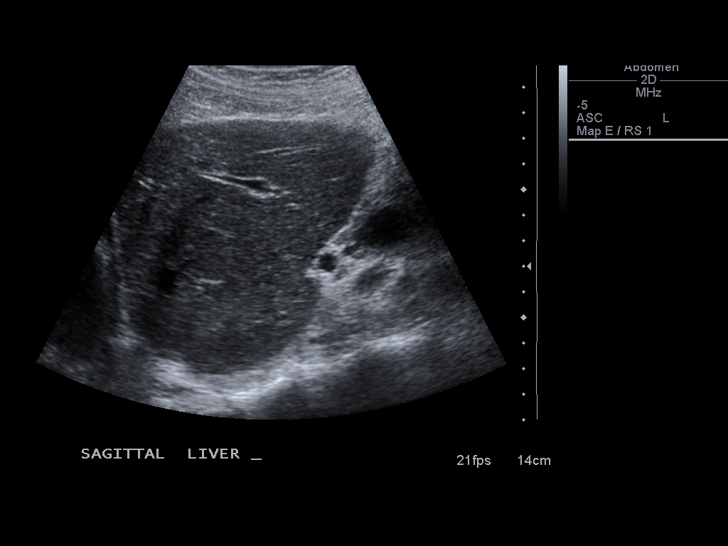
[im 21/83]
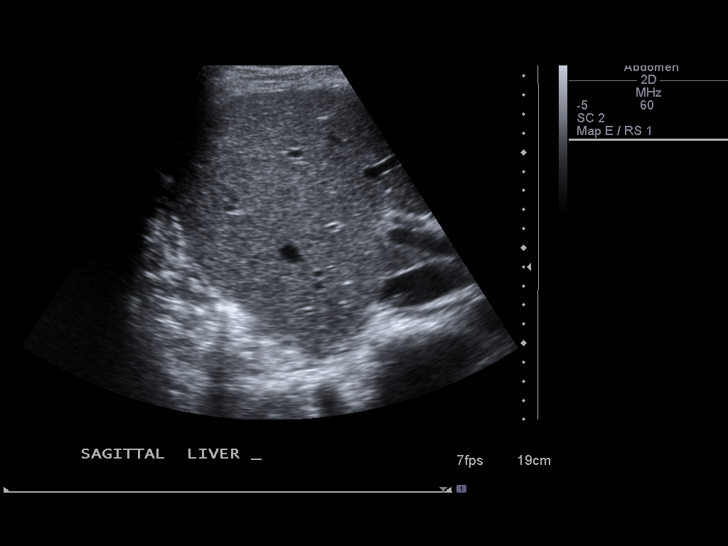
[im 28/83]
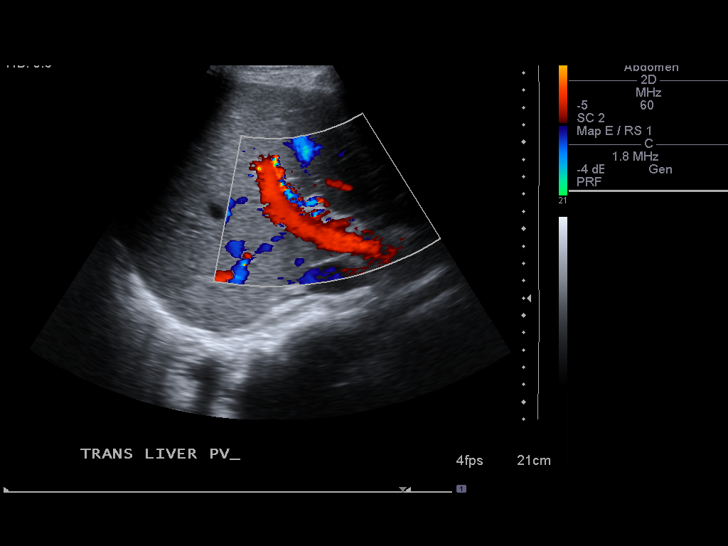
[im 31/83]
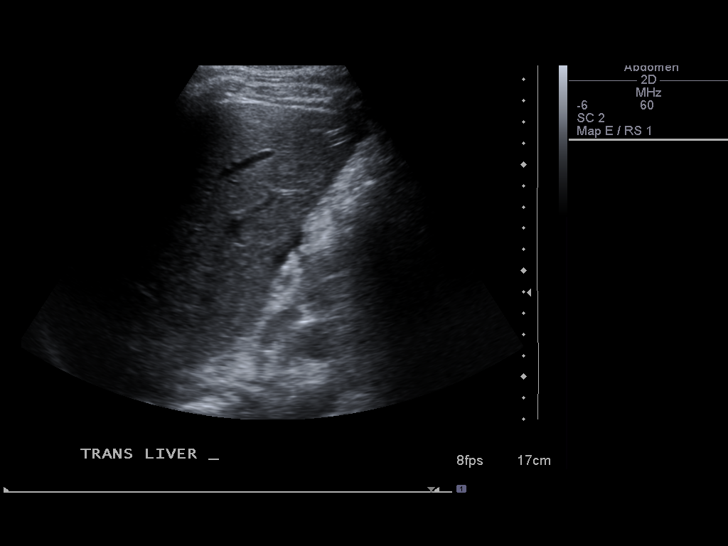
[im 38/83]
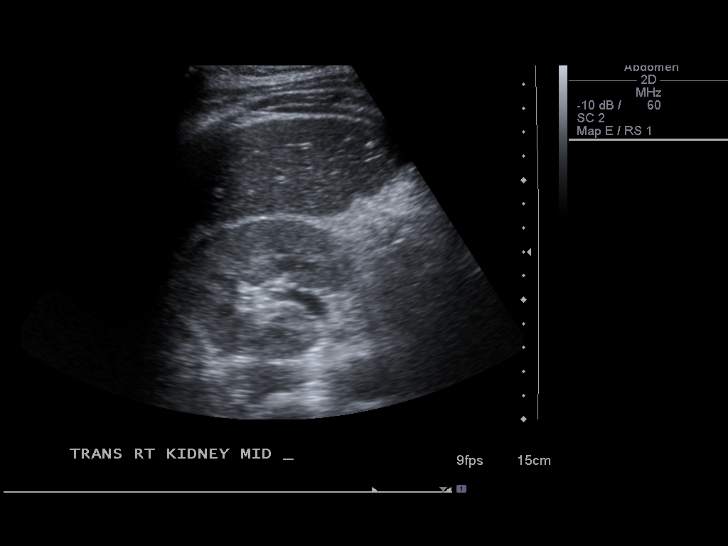
[im 45/83]
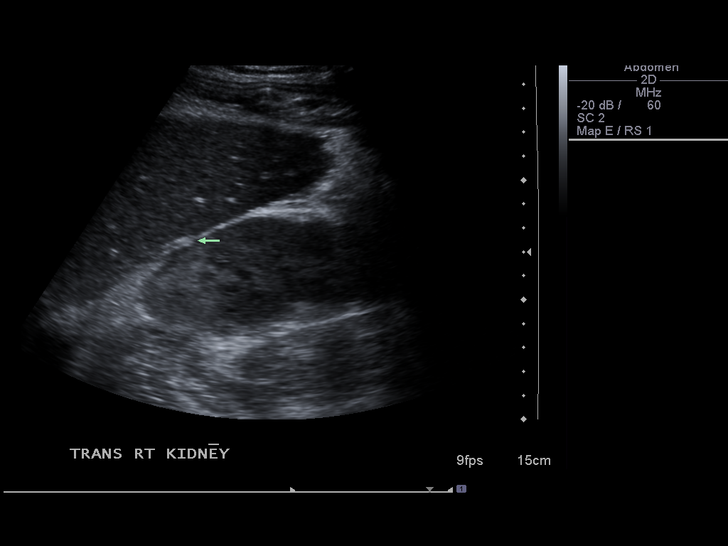
[im 52/83]
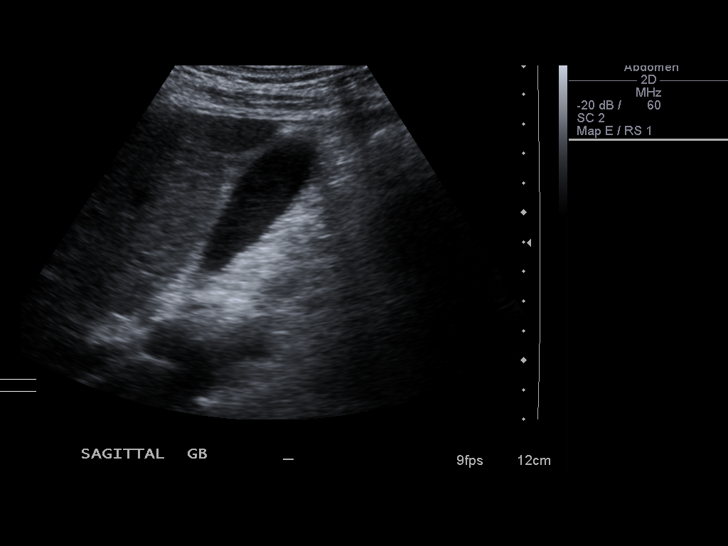
[im 55/83]
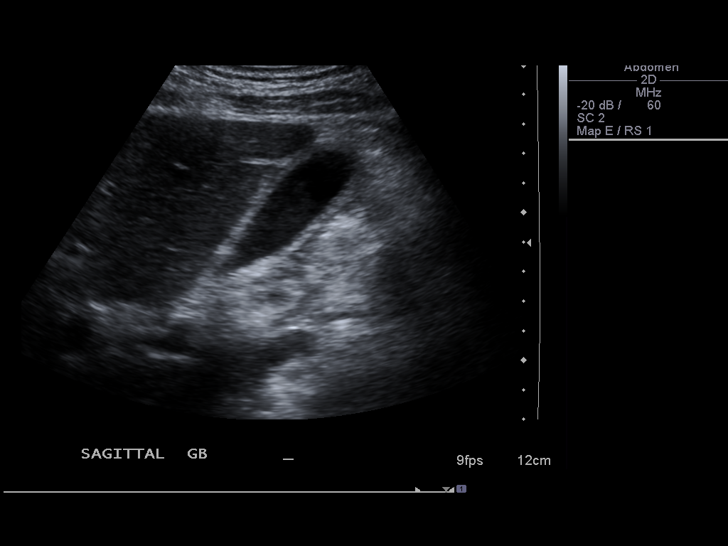
[im 62/83]
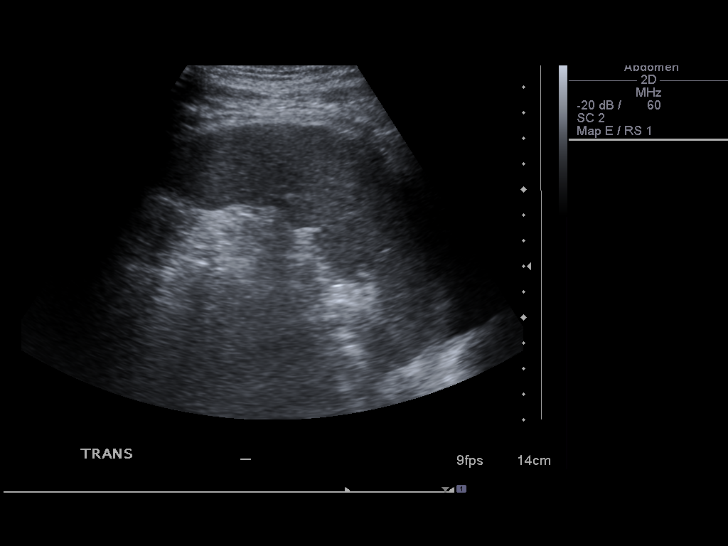
[im 69/83]
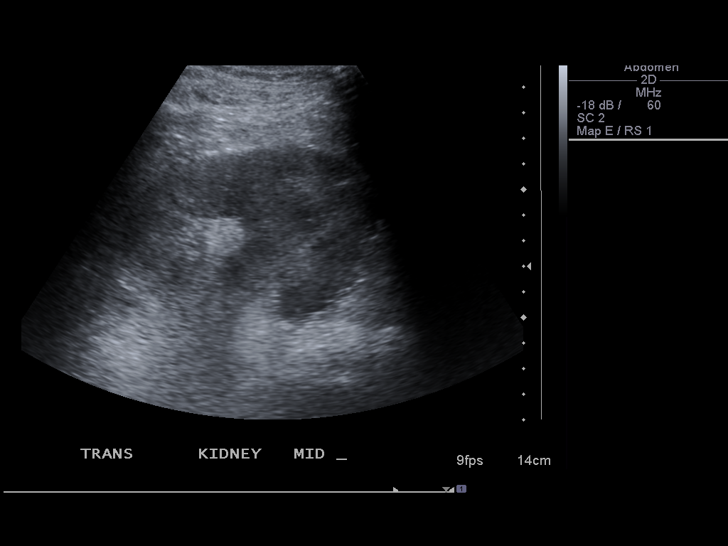
[im 76/83]
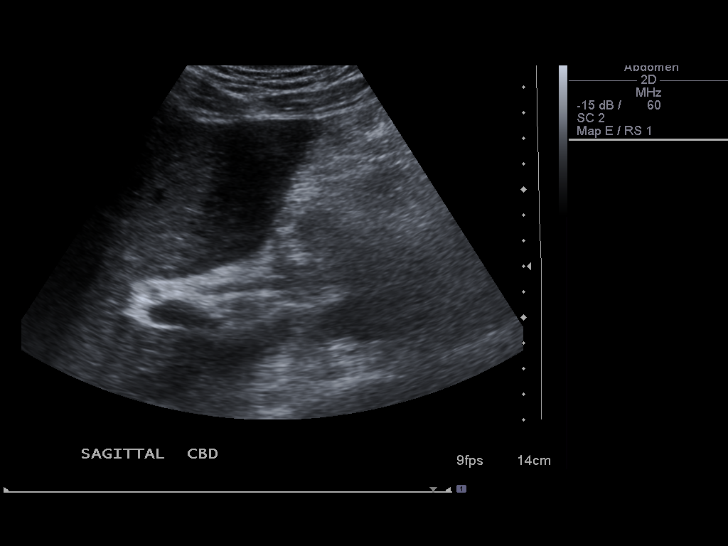
[im 83/83]
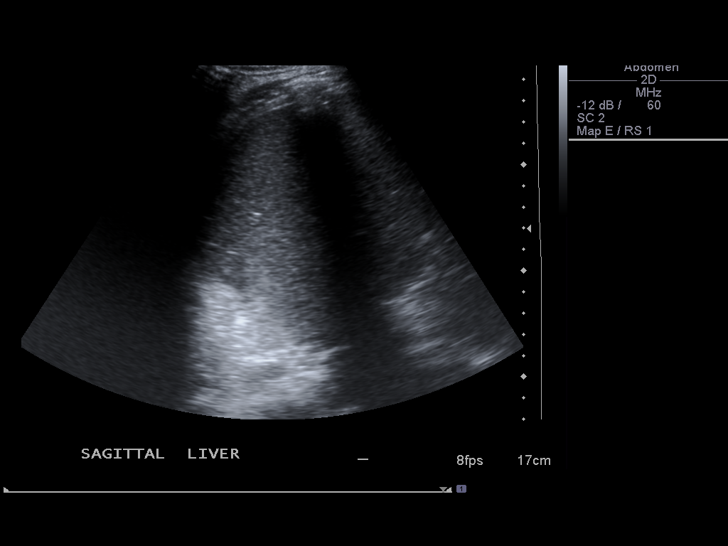

[14 of 25 positions shown; findings below may reference images not displayed]

FINDINGS: Gallbladder:  Minimal dependent sludge is noted within the
gallbladder.  The gallbladder is not distended, with gallbladder
wall thickness measuring borderline thickened at 4 mm.  No
shadowing calculus or sonographic Murphy's sign noted.

Common Bile Duct:  Within normal limits in caliber.

Liver: No focal mass lesion identified.  Within normal limits in
parenchymal echogenicity.

IVC:  Appears normal.

Pancreas:  No abnormality identified.

Spleen:  Within normal limits in size and echotexture.

Right kidney:  Normal in size and parenchymal echogenicity.  No
evidence of mass or hydronephrosis.

Left kidney:  Normal in size and parenchymal echogenicity.  No
evidence of mass or hydronephrosis.

Abdominal Aorta:  Small amount of ascites is noted.  Trace left
pleural effusion.
IMPRESSION: Minimal sludge within the gallbladder with borderline wall
thickening.  This could indicate cholecystitis in the appropriate
clinical context.

Trace ascites.

## 2013-10-25 ENCOUNTER — Ambulatory Visit: Payer: Managed Care, Other (non HMO) | Admitting: Emergency Medicine

## 2013-10-26 ENCOUNTER — Encounter: Payer: Self-pay | Admitting: Emergency Medicine

## 2013-10-26 ENCOUNTER — Ambulatory Visit (INDEPENDENT_AMBULATORY_CARE_PROVIDER_SITE_OTHER): Payer: Managed Care, Other (non HMO) | Admitting: Emergency Medicine

## 2013-10-26 ENCOUNTER — Ambulatory Visit (INDEPENDENT_AMBULATORY_CARE_PROVIDER_SITE_OTHER)
Admission: RE | Admit: 2013-10-26 | Discharge: 2013-10-26 | Disposition: A | Discharge status: Home / Self Care | Payer: Managed Care, Other (non HMO) | Source: Ambulatory Visit | Attending: Emergency Medicine | Admitting: Emergency Medicine

## 2013-10-26 VITALS — BP 154/88 | HR 82 | Temp 98.0°F | Ht 74.0 in | Wt 210.6 lb

## 2013-10-26 DIAGNOSIS — J189 Pneumonia, unspecified organism: Secondary | ICD-10-CM

## 2013-10-26 DIAGNOSIS — I10 Essential (primary) hypertension: Secondary | ICD-10-CM | POA: Insufficient documentation

## 2013-10-26 NOTE — Assessment & Plan Note (Signed)
He has noticed that his blood pressure has been elevated on 2 occasions, including today, since his medication was changed from omelsartan to losartan-HCTZ. I asked him to call Dr. Nolon Rod to discuss this and to consider an increase in the dose of his new medication.

## 2013-10-26 NOTE — Progress Notes (Signed)
Subjective:    Patient ID: [name] [name], male    DOB: July 26, 1966, 47 y.o.   MRN: [id number] HPI 47 yo man, never smoker, hx HTN, DM, hyperlipidemia. I saw him when admitted for RLL pneumococcal PNA, then when he was readmitted for parapneumonic effusion +/- empyema. He underwent successful VATS decortication by Dr Roxan Hockey in 04/09/12. Course c/b anemia, required endoscopy/CSY.  He has been followed by Dr Drucilla Schmidt, is taking abx and will stay on levaquin for 2 more weeks. He is having some exertional SOB after climbing stairs. Having some R sided pain.   ROV 06/09/12 -- never smoker, hx HTN, DM, hyperlipidemia. I saw him when admitted for RLL pneumococcal PNA, required VATS decortication. Follows up after repeat CT scan 6/14 - shows resolution, some residual RLL scar. Tells me he has been dealing with dizziness, some DOE when climbing stairs.  Anemia improved on last CBC.    ROV 07/14/12 -- follow up for dyspnea, recent RLL pneumococcal PNA that required decort for parapneumonic effusion. PFT today = restriction on spiro and volumes, decreased DLCO that corrects for Va. He teels me that he feels right sided discomfort with a deep breath. Since last time has remained dizzy. Hgb is coming up, being followed by Dr Nolon Rod. Still dizzy - CT scan head reassuring. His exertional tolerance is pretty good.   ROV 10/26/13 -- history of right lower lobe pneumococcal pneumonia and parapneumonic effusion that required decortication. He is a never smoker with PFTs that show restriction but no obstruction. He returns for followup. Breathing has been stable but he is still limited with exertion. He has been well until recently when he developed some chills. No fevers, very  cough. No sputum.    PULMONARY FUNCTON TEST 07/13/2012  FVC 3.1  FEV1 2.73  FEV1/FVC 88.1  FVC  % Predicted 55  FEV % Predicted 67  FeF 25-75 2.73  FeF 25-75 % Predicted 3.97       Objective:   Physical Exam Filed Vitals:   10/26/13 1100  BP: 154/88  Pulse: 82  Temp: 98 F (36.7 C)   Gen: Pleasant, well-nourished, in no distress,  normal affect  ENT: No lesions,  mouth clear,  oropharynx clear, no postnasal drip  Neck: No JVD, no TMG, no carotid bruits  Lungs: No use of accessory muscles, no dullness to percussion, clear without rales or rhonchi;   Cardiovascular: RRR, heart sounds normal, no murmur or gallops, no peripheral edema  Musculoskeletal: No deformities, no cyanosis or clubbing  Neuro: alert, non focal  Skin: Warm, no lesions or rashes  CBC    Component Value Date/Time   WBC 9.6 04/29/2013 1756   RBC 3.27* 04/29/2013 1756   RBC 2.95* 04/04/2012 1210   HGB 9.8* 04/29/2013 1756   HCT 27.8* 04/29/2013 1756   PLT 227 04/29/2013 1756   MCV 85.0 04/29/2013 1756   MCH 30.0 04/29/2013 1756   MCHC 35.3 04/29/2013 1756   RDW 11.4* 04/29/2013 1756   LYMPHSABS 1.8 04/29/2013 1756   MONOABS 0.6 04/29/2013 1756   EOSABS 0.2 04/29/2013 1756   BASOSABS 0.1 04/29/2013 1756    CT scan 06/05/12 --  Comparison: 04/07/2012  Findings: No axillary or supraclavicular adenopathy.  There is no enlarged mediastinal or hilar adenopathy. There has  been decrease in volume of the pericardial effusion. Near complete  resolution of bilateral pleural effusions.  Atelectasis, scarring and small peripheral area of consolidation  within the right lower lobe is identified on today's exam  compatible with resolving pneumonia. No airway mass identified.  Review of the visualized osseous structures is significant for mild  thoracic spondylosis.  IMPRESSION:  1. Near complete resolution of bilateral pleural effusions and  decrease in volume of pericardial effusion.  2. Residual atelectasis, scarring and peripheral consolidation  within the right lower lobe. Findings compatible with resolving  infection.      Assessment & Plan:  Pneumonia, organism unspecified History of severe right lower lobe pneumococcal pneumonia. No clear signs  of recurrence although he has been having chills.  - Will perform chest x-ray today to ensure no interval change - We discussed the utility of Prevnar 13. At this time I have recommended that he defer until there his insurance coverage and more data to support this vaccination.  - Followup when necessary  Essential hypertension, benign He has noticed that his blood pressure has been elevated on 2 occasions, including today, since his medication was changed from omelsartan to losartan-HCTZ. I asked him to call Dr. Nolon Rod to discuss this and to consider an increase in the dose of his new medication.

## 2013-10-26 NOTE — Patient Instructions (Signed)
Please have a chest x-ray performed today. We will call you with the results Speak with Dr. Nolon Rod about adjusting your blood pressure medication Follow with Dr. Lamonte Sakai if needed for any breathing symptoms other problems

## 2013-10-26 NOTE — Assessment & Plan Note (Signed)
History of severe right lower lobe pneumococcal pneumonia. No clear signs of recurrence although he has been having chills.  - Will perform chest x-ray today to ensure no interval change - We discussed the utility of Prevnar 13. At this time I have recommended that he defer until there his insurance coverage and more data to support this vaccination.  - Followup when necessary

## 2014-05-18 ENCOUNTER — Emergency Department (HOSPITAL_BASED_OUTPATIENT_CLINIC_OR_DEPARTMENT_OTHER)
Admission: EM | Admit: 2014-05-18 | Discharge: 2014-05-18 | Disposition: A | Discharge status: Home / Self Care | Payer: Worker's Compensation | Attending: Emergency Medicine | Admitting: Emergency Medicine

## 2014-05-18 ENCOUNTER — Encounter (HOSPITAL_BASED_OUTPATIENT_CLINIC_OR_DEPARTMENT_OTHER): Payer: Self-pay | Admitting: Emergency Medicine

## 2014-05-18 DIAGNOSIS — Z23 Encounter for immunization: Secondary | ICD-10-CM | POA: Insufficient documentation

## 2014-05-18 DIAGNOSIS — W260XXA Contact with knife, initial encounter: Secondary | ICD-10-CM | POA: Insufficient documentation

## 2014-05-18 DIAGNOSIS — Z880 Allergy status to penicillin: Secondary | ICD-10-CM | POA: Insufficient documentation

## 2014-05-18 DIAGNOSIS — Z8701 Personal history of pneumonia (recurrent): Secondary | ICD-10-CM | POA: Insufficient documentation

## 2014-05-18 DIAGNOSIS — Y9389 Activity, other specified: Secondary | ICD-10-CM | POA: Insufficient documentation

## 2014-05-18 DIAGNOSIS — E119 Type 2 diabetes mellitus without complications: Secondary | ICD-10-CM | POA: Insufficient documentation

## 2014-05-18 DIAGNOSIS — Z8619 Personal history of other infectious and parasitic diseases: Secondary | ICD-10-CM | POA: Insufficient documentation

## 2014-05-18 DIAGNOSIS — I10 Essential (primary) hypertension: Secondary | ICD-10-CM | POA: Insufficient documentation

## 2014-05-18 DIAGNOSIS — S61219A Laceration without foreign body of unspecified finger without damage to nail, initial encounter: Secondary | ICD-10-CM

## 2014-05-18 DIAGNOSIS — W261XXA Contact with sword or dagger, initial encounter: Secondary | ICD-10-CM

## 2014-05-18 DIAGNOSIS — S61209A Unspecified open wound of unspecified finger without damage to nail, initial encounter: Secondary | ICD-10-CM | POA: Insufficient documentation

## 2014-05-18 DIAGNOSIS — Z794 Long term (current) use of insulin: Secondary | ICD-10-CM | POA: Insufficient documentation

## 2014-05-18 DIAGNOSIS — Y9289 Other specified places as the place of occurrence of the external cause: Secondary | ICD-10-CM | POA: Insufficient documentation

## 2014-05-18 MED ORDER — TETANUS-DIPHTH-ACELL PERTUSSIS 5-2.5-18.5 LF-MCG/0.5 IM SUSP
0.5000 mL | Freq: Once | INTRAMUSCULAR | Status: AC
  Administered 2014-05-18: 0.5 mL via INTRAMUSCULAR
  Filled 2014-05-18: qty 0.5

## 2014-05-18 NOTE — ED Notes (Signed)
Cut right index finger with "circular knife" at work approx 830pm

## 2014-05-18 NOTE — ED Provider Notes (Addendum)
CSN: [id number]     Arrival date & time 05/18/14  2116 History   First MD Initiated Contact with Patient 05/18/14 2229     This chart was scribed for Blanchie Dessert, MD by Forrestine Him, ED Scribe. This patient was seen in room MH08/MH08 and the patient's care was started 10:31 PM.   Chief Complaint  Patient presents with  . Laceration   The history is provided by the patient. No language interpreter was used.    HPI Comments: [name] [name] is a 48 y.o. Male with a PMHx of DM and HTN who presents to the Emergency Department complaining of a laceration to the R index finger sustained around 8:30 PM this evening. Pt states he was cutting something with a circular knife when he accidentally cut his finger. He has not tried anything to the area and has not cleaned the wound prior to arrival. He denies any numbness, tingling, loss of sensation, or paresthesia. He has no other pertinent past medical history. No other concerns this visit.  Past Medical History  Diagnosis Date  . Diabetes mellitus   . Pneumonia 02/2012    Strep pneumoniae bilateral pneumonia complicated by bacteremia  . Bacteremia   . Hypertension    Past Surgical History  Procedure Laterality Date  . Esophagogastroduodenoscopy  03/31/2012    Procedure: ESOPHAGOGASTRODUODENOSCOPY (EGD);  Surgeon: Beryle Beams, MD;  Location: [facility] ENDOSCOPY;  Service: Endoscopy;  Laterality: N/A;  . Colonoscopy  04/01/2012    Procedure: COLONOSCOPY;  Surgeon: Juanita Craver, MD;  Location: [facility] ENDOSCOPY;  Service: Endoscopy;  Laterality: N/A;  . Chest tube insertion      placed during hospitalization 02/2012   Family History  Problem Relation Age of Onset  . Diabetes Maternal Grandmother    History  Substance Use Topics  . Smoking status: Never Smoker   . Smokeless tobacco: Never Used  . Alcohol Use: Yes     Comment: rare     Review of Systems  A complete 10 system review of systems was obtained and all systems are negative except  as noted in the HPI and PMH.     Allergies  Penicillins  Home Medications   Prior to Admission medications   Medication Sig Start Date End Date Taking? Authorizing Provider  SIMVASTATIN PO Take by mouth.   Yes Historical Provider, MD  aspirin 81 MG tablet Take 81 mg by mouth every morning.     Historical Provider, MD  ciprofloxacin (CILOXAN) 0.3 % ophthalmic solution 1 drop every 4 (four) hours. 10/24/13   Historical Provider, MD  insulin aspart (NOVOLOG) 100 UNIT/ML injection Inject 11 Units into the skin 3 (three) times daily before meals. Based on sliding scale. 03/16/12   [name] Rumpf York, PA-C  insulin glargine (LANTUS) 100 UNIT/ML injection Inject 40 Units into the skin at bedtime. 04/14/12   Nita Sells, MD  losartan-hydrochlorothiazide (HYZAAR) 50-12.5 MG per tablet 1 tablet daily. 09/16/13   Historical Provider, MD  prednisoLONE acetate (PRED FORTE) 1 % ophthalmic suspension 1 drop every 4 (four) hours. 10/24/13   Historical Provider, MD  rosuvastatin (CRESTOR) 10 MG tablet Take 10 mg by mouth every morning.     Historical Provider, MD   Triage Vitals: BP 158/89  Pulse 88  Temp(Src) 98.4 F (36.9 C) (Oral)  Resp 16  Ht 6\' 2"  (1.88 m)  Wt 204 lb (92.534 kg)  BMI 26.18 kg/m2  SpO2 99%   Physical Exam  Nursing note and vitals reviewed. Constitutional: He  is oriented to person, place, and time. He appears well-developed and well-nourished.  HENT:  Head: Normocephalic.  Eyes: EOM are normal.  Neck: Normal range of motion.  Cardiovascular:  Normal capillary refill  Pulmonary/Chest: Effort normal.  Abdominal: He exhibits no distension.  Musculoskeletal: Normal range of motion.  Neurological: He is alert and oriented to person, place, and time.  Skin:  1 cm laceration to the R index finger at the PIP joint Normal ROM without any tendon injury  Psychiatric: He has a normal mood and affect.    ED Course  Procedures (including critical care time)  DIAGNOSTIC  STUDIES: Oxygen Saturation is 99% on RA, Normal by my interpretation.    COORDINATION OF CARE: 10:34 PM- Will perform laceration repair. Discussed treatment plan with pt at bedside and pt agreed to plan.   10:42 PM- LACERATION REPAIR Performed by: Blanchie Dessert, MD Consent: Verbal consent obtained. Risks and benefits: risks, benefits and alternatives were discussed Patient identity confirmed: provided demographic data Time out performed prior to procedure Prepped and Draped in normal sterile fashion Wound explored Laceration Location: R index finger at the PIP joint Laceration Length: 1 cm No Foreign Bodies seen or palpated Anesthesia: local infiltration Local anesthetic: lidocaine 1% without epinephrine Anesthetic total: 1 ml Irrigation method: syringe Amount of cleaning: standard Skin closure: 4.0 vicryl Number of sutures or staples: 3 Technique: simple interrupted. Patient tolerance: Patient tolerated the procedure well with no immediate complications.   Labs Review Labs Reviewed - No data to display  Imaging Review No results found.   EKG Interpretation None      MDM   Final diagnoses:  Finger laceration    Patient with superficial laceration to the index finger. No tendon abnormalities.  Tetanus shot updated and wound repair as above.  I personally performed the services described in this documentation, which was scribed in my presence. The recorded information has been reviewed and is accurate.    Blanchie Dessert, MD 05/18/14 [id number]  Blanchie Dessert, MD 05/18/14 [phone]

## 2014-05-18 NOTE — ED Notes (Signed)
1/2 lac to first finger rt hand   Bleeding controlled

## 2014-05-18 NOTE — ED Notes (Signed)
dsg removed-Lac noted across knuckle-continues to bleed-gauze and kling wrap applied-EMT aware of need for UDS per pt

## 2014-06-15 ENCOUNTER — Encounter (HOSPITAL_BASED_OUTPATIENT_CLINIC_OR_DEPARTMENT_OTHER): Payer: Self-pay | Admitting: Emergency Medicine

## 2014-06-15 ENCOUNTER — Emergency Department (HOSPITAL_BASED_OUTPATIENT_CLINIC_OR_DEPARTMENT_OTHER)
Admission: EM | Admit: 2014-06-15 | Discharge: 2014-06-16 | Disposition: A | Discharge status: Home / Self Care | Payer: Managed Care, Other (non HMO) | Attending: Emergency Medicine | Admitting: Emergency Medicine

## 2014-06-15 DIAGNOSIS — IMO0002 Reserved for concepts with insufficient information to code with codable children: Secondary | ICD-10-CM | POA: Insufficient documentation

## 2014-06-15 DIAGNOSIS — Z79899 Other long term (current) drug therapy: Secondary | ICD-10-CM | POA: Insufficient documentation

## 2014-06-15 DIAGNOSIS — Z880 Allergy status to penicillin: Secondary | ICD-10-CM | POA: Insufficient documentation

## 2014-06-15 DIAGNOSIS — L03129 Acute lymphangitis of unspecified part of limb: Secondary | ICD-10-CM

## 2014-06-15 DIAGNOSIS — Z7982 Long term (current) use of aspirin: Secondary | ICD-10-CM | POA: Insufficient documentation

## 2014-06-15 DIAGNOSIS — E119 Type 2 diabetes mellitus without complications: Secondary | ICD-10-CM | POA: Insufficient documentation

## 2014-06-15 DIAGNOSIS — L03011 Cellulitis of right finger: Secondary | ICD-10-CM

## 2014-06-15 DIAGNOSIS — Z794 Long term (current) use of insulin: Secondary | ICD-10-CM | POA: Insufficient documentation

## 2014-06-15 DIAGNOSIS — I10 Essential (primary) hypertension: Secondary | ICD-10-CM | POA: Insufficient documentation

## 2014-06-15 DIAGNOSIS — Z792 Long term (current) use of antibiotics: Secondary | ICD-10-CM | POA: Insufficient documentation

## 2014-06-15 DIAGNOSIS — L02519 Cutaneous abscess of unspecified hand: Secondary | ICD-10-CM | POA: Insufficient documentation

## 2014-06-15 DIAGNOSIS — Z8701 Personal history of pneumonia (recurrent): Secondary | ICD-10-CM | POA: Insufficient documentation

## 2014-06-15 DIAGNOSIS — L03019 Cellulitis of unspecified finger: Secondary | ICD-10-CM

## 2014-06-15 MED ORDER — DEXTROSE 5 % IV SOLN: 1.0000 g | Freq: Once | INTRAVENOUS | Status: DC

## 2014-06-15 MED ORDER — KETOROLAC TROMETHAMINE 30 MG/ML IJ SOLN
30.0000 mg | Freq: Once | INTRAMUSCULAR | Status: AC
  Administered 2014-06-16: 30 mg via INTRAVENOUS
  Filled 2014-06-15: qty 1

## 2014-06-15 MED ORDER — CEFTRIAXONE SODIUM 1 G IJ SOLR
INTRAMUSCULAR | Status: AC
  Administered 2014-06-15: 1000 mg via INTRAVENOUS
  Filled 2014-06-15: qty 10

## 2014-06-15 NOTE — ED Notes (Signed)
Bitten on back of right hand at 7:30 by unidentified black bug.  Not a bee.  Pain is increasing to hand and is now burning up his right arm. Some swelling and redness.

## 2014-06-15 NOTE — ED Notes (Addendum)
C/o insect bite to posterior R hand. C/o redness, itching, burning and swelling. Rates burning 4/10. Used cortisone cream at work. Redness & burning noted up to mid forearm. Difficult to discern new vs. old redness Several recent injuries to R hand. R index lac 1 month ago (sutures removed). R middle finger subunchal hematoma 1 month ago. Burn to R middle finger 1 week ago (redness and swelling present). 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 Recent injuries work related. Has been seen here previously for recent injuries. Alert, NAD, calm, interactive, no dyspnea noted.  Has been scratching R FA. (Denies: systemic itching, dyspnea, sob, sore throat or other sx). No hives noted.

## 2014-06-15 NOTE — ED Provider Notes (Signed)
CSN: [id number]     Arrival date & time 06/15/14  2213 History   None    This chart was scribed for [name] Speak, MD by Forrestine Him, ED Scribe. This patient was seen in room MH08/MH08 and the patient's care was started 10:51 PM.   Chief Complaint  Patient presents with  . Insect Bite   The history is provided by the patient. No language interpreter was used.    HPI Comments: [name] [name] is a 48 y.o. male with a PMHx of DM and HTN who presents to the Emergency Department complaining of a possible insect bite to the R hand sustained around 7:30 this evening. Pt reports gradually worsening pain to R hand and arm described as burning and itching. He has also noted swelling to R hand. He has tried applying topical hydrocortisone cream to the area without any improvement. At this time he denies any fever or chills. Pt states blood sugars have been running normally. 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 No other concerns this visit.   He is followed by  Past Medical History  Diagnosis Date  . Diabetes mellitus   . Pneumonia 02/2012    Strep pneumoniae bilateral pneumonia complicated by bacteremia  . Bacteremia   . Hypertension    Past Surgical History  Procedure Laterality Date  . Esophagogastroduodenoscopy  03/31/2012    Procedure: ESOPHAGOGASTRODUODENOSCOPY (EGD);  Surgeon: Beryle Beams, MD;  Location: [facility] ENDOSCOPY;  Service: Endoscopy;  Laterality: N/A;  . Colonoscopy  04/01/2012    Procedure: COLONOSCOPY;  Surgeon: Juanita Craver, MD;  Location: [facility] ENDOSCOPY;  Service: Endoscopy;  Laterality: N/A;  . Chest tube insertion      placed during hospitalization 02/2012   Family History  Problem Relation Age of Onset  . Diabetes Maternal Grandmother    History  Substance Use Topics  . Smoking status: Never Smoker   . Smokeless tobacco: Never Used  . Alcohol Use: Yes     Comment: rare     Review of Systems  Constitutional: Negative for fever and chills.   HENT: Negative for congestion.   Eyes: Negative for redness.  Respiratory: Negative for cough.   Musculoskeletal: Positive for arthralgias (R hand and R forearm).  Skin: Negative for rash.      Allergies  Penicillins  Home Medications   Prior to Admission medications   Medication Sig Start Date End Date Taking? Authorizing Provider  aspirin 81 MG tablet Take 81 mg by mouth every morning.     Historical Provider, MD  ciprofloxacin (CILOXAN) 0.3 % ophthalmic solution 1 drop every 4 (four) hours. 10/24/13   Historical Provider, MD  insulin aspart (NOVOLOG) 100 UNIT/ML injection Inject 11 Units into the skin 3 (three) times daily before meals. Based on sliding scale. 03/16/12   [name] Rumpf York, PA-C  insulin glargine (LANTUS) 100 UNIT/ML injection Inject 40 Units into the skin at bedtime. 04/14/12   Nita Sells, MD  losartan-hydrochlorothiazide (HYZAAR) 50-12.5 MG per tablet 1 tablet daily. 09/16/13   Historical Provider, MD  prednisoLONE acetate (PRED FORTE) 1 % ophthalmic suspension 1 drop every 4 (four) hours. 10/24/13   Historical Provider, MD  rosuvastatin (CRESTOR) 10 MG tablet Take 10 mg by mouth every morning.     Historical Provider, MD  SIMVASTATIN PO Take by mouth.    Historical Provider, MD   Triage Vitals: BP 152/84  Pulse 87  Temp(Src) 98.6 F (37 C) (Oral)  Resp 16  Ht 6'  3" (1.905 m)  Wt 205 lb (92.987 kg)  BMI 25.62 kg/m2  SpO2 95%   Physical Exam  Nursing note and vitals reviewed. Constitutional: He is oriented to person, place, and time. He appears well-developed and well-nourished.  HENT:  Head: Normocephalic.  Eyes: EOM are normal.  Neck: Normal range of motion.  Pulmonary/Chest: Effort normal.  Abdominal: He exhibits no distension.  Musculoskeletal: Normal range of motion.  R middle finger is noted to have a callus with purulent drainage. Their in minimal pain with active and passive extension. Redness and swelling to the finger with lymphatic  streaking into he forearm near the elbow.  Neurological: He is alert and oriented to person, place, and time.  Psychiatric: He has a normal mood and affect.    ED Course  Procedures (including critical care time)  DIAGNOSTIC STUDIES: Oxygen Saturation is 95% on RA, Adequate by my interpretation.    COORDINATION OF CARE: 10:51 PM-Discussed treatment plan with pt at bedside and pt agreed to plan.     Labs Review Labs Reviewed - No data to display  Imaging Review No results found.   EKG Interpretation None      MDM   Final diagnoses:  None    Patient has cellulitis of the index finger with lymphangitic streaking up the arm. He was treated with Rocephin and will be discharged with Keflex. I paged Dr. Lenon Curt from hand surgery several times to make arrangements for close followup, however the stages went unanswered. I will discharge the patient with Keflex and have advised him to call Dr. Brennan Bailey office to arrange followup. Ideally, he should be seen for followup in the next 24-48 hours. If he is unable to make these arrangements, or his symptoms worsen he should come back here for a recheck.  I personally performed the services described in this documentation, which was scribed in my presence. The recorded information has been reviewed and is accurate.    [name] Speak, MD 06/16/14 [phone]

## 2014-06-16 MED ORDER — HYDROCODONE-ACETAMINOPHEN 5-325 MG PO TABS: 1.0000 | ORAL_TABLET | Freq: Four times a day (QID) | ORAL | Status: DC | PRN

## 2014-06-16 MED ORDER — CEPHALEXIN 500 MG PO CAPS: 500.0000 mg | ORAL_CAPSULE | Freq: Four times a day (QID) | ORAL | Status: DC

## 2014-06-16 NOTE — Discharge Instructions (Signed)
Keflex as prescribed. Hydrocodone as prescribed as needed for pain.  Call Dr. Brennan Bailey office tomorrow to arrange an appointment for followup in the next 24-48 hours. If this is unable to be arranged, return to the ER for a recheck, sooner if symptoms worsen or change..   Cellulitis Cellulitis is an infection of the skin and the tissue beneath it. The infected area is usually red and tender. Cellulitis occurs most often in the arms and lower legs.  CAUSES  Cellulitis is caused by bacteria that enter the skin through cracks or cuts in the skin. The most common types of bacteria that cause cellulitis are Staphylococcus and Streptococcus. SYMPTOMS   Redness and warmth.  Swelling.  Tenderness or pain.  Fever. DIAGNOSIS  Your caregiver can usually determine what is wrong based on a physical exam. Blood tests may also be done. TREATMENT  Treatment usually involves taking an antibiotic medicine. HOME CARE INSTRUCTIONS   Take your antibiotics as directed. Finish them even if you start to feel better.  Keep the infected arm or leg elevated to reduce swelling.  Apply a warm cloth to the affected area up to 4 times per day to relieve pain.  Only take over-the-counter or prescription medicines for pain, discomfort, or fever as directed by your caregiver.  Keep all follow-up appointments as directed by your caregiver. SEEK MEDICAL CARE IF:   You notice red streaks coming from the infected area.  Your red area gets larger or turns dark in color.  Your bone or joint underneath the infected area becomes painful after the skin has healed.  Your infection returns in the same area or another area.  You notice a swollen bump in the infected area.  You develop new symptoms. SEEK IMMEDIATE MEDICAL CARE IF:   You have a fever.  You feel very sleepy.  You develop vomiting or diarrhea.  You have a general ill feeling (malaise) with muscle aches and pains. MAKE SURE YOU:   Understand  these instructions.  Will watch your condition.  Will get help right away if you are not doing well or get worse. Document Released: 09/18/2005 Document Revised: 06/09/2012 Document Reviewed: 02/24/2012 [facility] Patient Information 2015 [place], [place]. This information is not intended to replace advice given to you by your health care provider. Make sure you discuss any questions you have with your health care provider.

## 2014-06-16 NOTE — ED Notes (Signed)
Reports still swollen and hard to close hand, "feels a little better".

## 2015-05-19 ENCOUNTER — Emergency Department (HOSPITAL_BASED_OUTPATIENT_CLINIC_OR_DEPARTMENT_OTHER)
Admission: EM | Admit: 2015-05-19 | Discharge: 2015-05-19 | Disposition: A | Discharge status: Home / Self Care | Payer: Managed Care, Other (non HMO) | Attending: Emergency Medicine | Admitting: Emergency Medicine

## 2015-05-19 ENCOUNTER — Encounter (HOSPITAL_BASED_OUTPATIENT_CLINIC_OR_DEPARTMENT_OTHER): Payer: Self-pay | Admitting: Emergency Medicine

## 2015-05-19 DIAGNOSIS — Z792 Long term (current) use of antibiotics: Secondary | ICD-10-CM | POA: Insufficient documentation

## 2015-05-19 DIAGNOSIS — Z794 Long term (current) use of insulin: Secondary | ICD-10-CM | POA: Insufficient documentation

## 2015-05-19 DIAGNOSIS — Z8701 Personal history of pneumonia (recurrent): Secondary | ICD-10-CM | POA: Diagnosis not present

## 2015-05-19 DIAGNOSIS — Z87440 Personal history of urinary (tract) infections: Secondary | ICD-10-CM | POA: Diagnosis not present

## 2015-05-19 DIAGNOSIS — K5641 Fecal impaction: Secondary | ICD-10-CM | POA: Insufficient documentation

## 2015-05-19 DIAGNOSIS — Z880 Allergy status to penicillin: Secondary | ICD-10-CM | POA: Insufficient documentation

## 2015-05-19 DIAGNOSIS — E119 Type 2 diabetes mellitus without complications: Secondary | ICD-10-CM | POA: Diagnosis not present

## 2015-05-19 DIAGNOSIS — R1084 Generalized abdominal pain: Secondary | ICD-10-CM | POA: Diagnosis present

## 2015-05-19 DIAGNOSIS — Z7982 Long term (current) use of aspirin: Secondary | ICD-10-CM | POA: Insufficient documentation

## 2015-05-19 DIAGNOSIS — I10 Essential (primary) hypertension: Secondary | ICD-10-CM | POA: Diagnosis not present

## 2015-05-19 MED ORDER — FLEET ENEMA 7-19 GM/118ML RE ENEM
1.0000 | ENEMA | Freq: Once | RECTAL | Status: AC
  Administered 2015-05-19: 1 via RECTAL

## 2015-05-19 MED ORDER — POLYETHYLENE GLYCOL 3350 17 G PO PACK: 17.0000 g | PACK | Freq: Every day | ORAL | Status: DC

## 2015-05-19 MED ORDER — LOSARTAN POTASSIUM-HCTZ 50-12.5 MG PO TABS: 1.0000 | ORAL_TABLET | Freq: Every day | ORAL | Status: DC

## 2015-05-19 MED ORDER — FLEET ENEMA 7-19 GM/118ML RE ENEM
ENEMA | RECTAL | Status: AC
  Administered 2015-05-19: 1 via RECTAL
  Filled 2015-05-19: qty 1

## 2015-05-19 NOTE — Discharge Instructions (Signed)
Fecal Impaction A fecal impaction happens when there is a large, firm amount of stool (or feces) that cannot be passed. The impacted stool is usually in the rectum, which is the lowest part of the large bowel. The impacted stool can block the colon and cause significant problems. CAUSES  The longer stool stays in the rectum, the harder it gets. Anything that slows down your bowel movements can lead to fecal impaction, such as:  Constipation. This can be a long-standing (chronic) problem or can happen suddenly (acute).  Painful conditions of the rectum, such as hemorrhoids or anal fissures. The pain of these conditions can make you try to avoid having bowel movements.  Narcotic pain-relieving medicines, such as methadone, morphine, or codeine.  Not drinking enough fluids.  Inactivity and bed rest over long periods of time.  Diseases of the brain or nervous system that damage the nerves controlling the muscles of the intestines. SIGNS AND SYMPTOMS   Lack of normal bowel movements or changes in bowel patterns.  Sense of fullness in the rectum but unable to pass stool.  Pain or cramps in the abdominal area (often after meals).  Thin, watery discharge from the rectum. DIAGNOSIS  Your health care provider may suspect that you have a fecal impaction based on your symptoms and a physical exam. This will include an exam of your rectum. Sometimes X-rays or lab testing may be needed to confirm the diagnosis and to be sure there are no other problems.  TREATMENT   Initially an impaction can be removed manually. Using a gloved finger, your health care provider can remove hard stool from your rectum.  Medicine is sometimes needed. A suppository or enema can be given in the rectum to soften the stool, which can stimulate a bowel movement. Medicines can also be given by mouth (orally).  Though rare, surgery may be needed if the colon has torn (perforated) due to blockage. HOME CARE INSTRUCTIONS    Develop regular bowel habits. This could include getting in the habit of having a bowel movement after your morning cup of coffee or after eating. Be sure to allow yourself enough time on the toilet.  Maintain a high-fiber diet.  Drink enough fluids to keep your urine clear or pale yellow as directed by your health care provider.  Exercise regularly.  If you begin to get constipated, increase the amount of fiber in your diet. Eat plenty of fruits, vegetables, whole wheat breads, bran, oatmeal, and similar products.  Take natural fiber laxatives or other laxatives only as directed by your health care provider. SEEK MEDICAL CARE IF:   You have ongoing rectal pain.  You require enemas or suppositories more than twice a week.  You have rectal bleeding.  You have continued problems, or you develop abdominal pain.  You have thin, pencil-like stools. SEEK IMMEDIATE MEDICAL CARE IF:  You have black or tarry stools. MAKE SURE YOU:   Understand these instructions.  Will watch your condition.  Will get help right away if you are not doing well or get worse. Document Released: 08/31/2004 Document Revised: 09/29/2013 Document Reviewed: 06/15/2013 [facility] Patient Information 2015 [place], [place]. This information is not intended to replace advice given to you by your health care provider. Make sure you discuss any questions you have with your health care provider.   Emergency Department Resource Guide 1) Find a Doctor and Pay Out of Pocket Although you won't have to find out who is covered by your insurance plan, it is  a good idea to ask around and get recommendations. You will then need to call the office and see if the doctor you have chosen will accept you as a new patient and what types of options they offer for patients who are self-pay. Some doctors offer discounts or will set up payment plans for their patients who do not have insurance, but you will need to ask so you aren't  surprised when you get to your appointment.  2) Contact Your Local Health Department Not all health departments have doctors that can see patients for sick visits, but many do, so it is worth a call to see if yours does. If you don't know where your local health department is, you can check in your phone book. The CDC also has a tool to help you locate your state's health department, and many state websites also have listings of all of their local health departments.  3) Find a [facility] If your illness is not likely to be very severe or complicated, you may want to try a walk in clinic. These are popping up all over the country in pharmacies, drugstores, and shopping centers. They're usually staffed by nurse practitioners or physician assistants that have been trained to treat common illnesses and complaints. They're usually fairly quick and inexpensive. However, if you have serious medical issues or chronic medical problems, these are probably not your best option.  No Primary Care Doctor: - Call Health Connect at  [phone] - they can help you locate a primary care doctor that  accepts your insurance, provides certain services, etc. - Physician Referral Service- [phone]  Chronic Pain Problems: Organization         Address  Phone   Notes  [facility]  [phone] Patients need to be referred by their primary care doctor.   Medication Assistance: Organization         Address  Phone   Notes  [facility] Medication [facility] [address] [place], [place] [address] [phone] --Must be a resident of [facility] -- Must have NO insurance coverage whatsoever (no Medicaid/ Medicare, etc.) -- The pt. MUST have a primary care doctor that directs their care regularly and follows them in the community   MedAssist  [phone]   [employer]  [phone]    Agencies that provide inexpensive medical care: Organization          Address  Phone   Notes  [place]  [phone]   [facility]    [phone]   [facility] [place], [place] [address] [phone]   [place] [address] [place] [phone]   Planned Parenthood    [phone]   [facility]    [phone]   [place] [address], [place] Phone:  [phone], Fax:  [phone] Hours of Operation:  9 am - 6 pm, M-F.  Also accepts Medicaid/Medicare and self-pay.  [facility] for [address], [address], [place] Phone: [phone], Fax: [phone]. Hours of Operation:  8:30 am - 5:30 pm, M-F.  Also accepts Medicaid and self-pay.  [facility] [place] [address] [place] Phone: [phone]   [place], [place], [place] [phone], Ext. [phone] Mondays & Thursdays: 7-9 AM.  First 15 patients are seen on a first come, first serve basis.    [facility] Providers:  Organization         Address  Phone   Notes  [facility] [address], [place] [phone] Also accepts self-pay patients.  [facility] [id number] [place], [place]  [phone]   [place], [address], [place] [phone]   [facility] [address] [place] [phone]   Lucianne Lei [address], [place]   [phone] Only accepts [place] Access [place] patients after they have their name applied to their card.   Self-Pay (no insurance) in [facility]:  Organization         Address  Phone   Notes  Sickle Cell Patients, [facility] [place] [phone]   [facility] [place] [phone]    [facility] [place]  [place], [place], [place] [phone]   [facility]/Dr. Osei-Bonsu  [address], [place] or [address], [place] [phone] Phone number for both [place] and [place] locations is the same.  Urgent Medical and [facility] [address] [place] [phone]   [facility] [address], [place] or [address] [phone]   [facility] [address], [place] [phone], phone; [phone], fax Sees patients 1st and 3rd Saturday of every month.  Must not qualify for public or private insurance (i.e. Medicaid, Medicare, [place] Health Choice, Veterans' Benefits)  Household income should be no more than 200% of the poverty level The clinic cannot treat you if you are pregnant or think you are pregnant  Sexually transmitted diseases are not treated at the clinic.    Dental Care: Organization         Address  Phone  Notes  [facility] Department of [facility] [phone] Accepts children up to age 77 who are enrolled in [place] or [place]; pregnant women with a Medicaid card; and children who have applied for Medicaid or [place] Health Choice, but were declined, whose parents can pay a reduced fee at time of service.  [facility] Department of [facility]  [address], [place] [phone] Accepts children up to age 71 who are enrolled in [place] or [place]; pregnant women with a Medicaid card; and children who have applied for Medicaid or [place] Health Choice, but were declined, whose parents can pay a reduced fee at time of service.  [place] Adult Dental Access PROGRAM  [place] [phone] Patients are seen by appointment only. Walk-ins are not accepted. [place] will see patients 6  years of age and older. Monday - Tuesday (8am-5pm) Most Wednesdays (8:30-5pm) $30 per visit, cash only  [facility] Adult Dental Access PROGRAM  [address], [facility] [phone] Patients are seen by appointment only. Walk-ins are not accepted. [place] will see patients 35 years of age and older. One Wednesday Evening (Monthly: Volunteer Based).  $30 per visit, cash only  [place]  [phone] for adults; Children under age 35, call Graduate  Pediatric Dentistry at [phone]. Children aged 48-14, please call [phone] to request a pediatric application.  Dental services are provided in all areas of dental care including fillings, crowns and bridges, complete and partial dentures, implants, gum treatment, root canals, and extractions. Preventive care is also provided. Treatment is provided to both adults and children. Patients are selected via a lottery and there is often a waiting list.   [facility] [address] [place]  [phone] [email or web address]   [facility] [address] [place], [place] [phone], Ext. [phone] Second and Fourth Thursday of each month, opens at 6:30 AM; Clinic ends at 9 AM.  Patients are seen on a first-come first-served basis, and a limited number are seen during each clinic.   [facility]  [address] [place]ard Danker [place], [place] [phone]   Eligibility Requirements You must have lived in [place], [place], or [place] counties for at least the last three months.   You cannot be eligible for state or federal sponsored [employer], including [employer], [place], or [employer].   You generally cannot be eligible for healthcare insurance through your employer.    How to apply: Eligibility screenings are held every Tuesday and Wednesday afternoon from 1:00 pm until 4:00 pm. You do not need an appointment for the interview!  [facility]  [address], [place], [place]   [place]  [place] Department  [facility]  [phone]    Behavioral Health Resources in the Community: Intensive Outpatient Programs Organization         Address  Phone  Notes  [place]. [address], [place], [place] [phone]   [facility] [address], [place], [place]   ADS: Alcohol & Drug Sv[facility] [address], [place], [place]   [place] [address]  [place], [place] or [phone]   Substance Abuse Resources Organization         Address  Phone  Notes  Alcohol and Drug Services  [phone]   [place]  [phone]   [facility] [place]   Chinita Pester  [phone]   Residential & Outpatient Substance Abuse Program  [phone]   Psychological Services Organization         Address  Phone  Notes  [facility] [place]  [place]  [phone]   [place] [address] [place] or [phone]    Mobile Crisis Teams Organization         Address  Phone  Notes  [facility], Mobile Crisis Care Unit  [phone]   [facility]  [address] [place], [place]   [place] [address] [place] [phone]    Self-Help/Support Groups Organization         Address  Phone             Notes  [place]. of [place] - variety of support groups  [place] Call for more information  Narcotics Anonymous (NA), [facility] [address], [employer] [place]  2 meetings at this location   Special educational needs teacher         Address  Phone  Notes  [facility] Residential Treatment [place],  [place]   [place]  [address], [place] [id number], [place], [place]   [place]uncey Wh[place], [place] [phone] Admissions: 8am-3pm M-F  Incentives Substance [place] [phone]    [place], [place] [phone]   [facility] [address] [place], [place], [place]   [facility] [address],  [place], [place]   [facility] - Intensive Outpatient [address] Kristeen [address], [place], [place]   [facility] [place].,  [place], [place] [phone] or [phone]   [facility] Services (RTS) [address] [place], [place] Accepts Medicaid  [facility] [place] [address]  [place] [phone] Substance Abuse/Addiction Treatment   [facility] Organization         Address  Phone  Notes  [facility]  [phone]   Domenic Schwab, PhD [address] Arlis [place], [place]   [phone] or [phone]   [place], [place] [phone]   [facility] [address] [place], [place] [phone] Insurance/Medicaid/sponsorship through [facility] and Families [address], [address]                                    [place], [place] [phone] [place] [address], [place] [phone]    Dr. Adele Schilder  [phone]   [facility] Dept. 1) [address], [place] 2) [place] 3)  [place] [address], [place] [phone]  [phone]   [place] [phone] or [phone] (After Hours)

## 2015-05-19 NOTE — ED Notes (Signed)
0700 - late entry. Patient to the restroom to attempt to have a BM

## 2015-05-19 NOTE — ED Notes (Signed)
Patient states that he has had his last Bm yesterday at noon. The patient reports that he has been sitting all night on the toilet to have a BM - he feels bloated and had pressure to his rectum. The patient reports that he tried some OTC remedies.

## 2015-05-19 NOTE — ED Notes (Addendum)
Patient back to room, he reports that he had a small amount of stool but feels like it should have been more. The patient reports that there is a lot less pressure at this time and is ready to leave. Patient given several non medication interventions that he could try to see if things get better.

## 2015-05-19 NOTE — ED Provider Notes (Signed)
CSN: [id number]     Arrival date & time 05/19/15  [id number] History   First MD Initiated Contact with Patient 05/19/15 0631     Chief Complaint  Patient presents with  . Abdominal Pain     (Consider location/radiation/quality/duration/timing/severity/associated sxs/prior Treatment) Patient is a 49 y.o. male presenting with abdominal pain.  Abdominal Pain Pain location:  Generalized Pain quality: aching and bloating   Pain radiates to:  Does not radiate Pain severity:  Moderate Onset quality:  Gradual Duration:  1 day Timing:  Constant Progression:  Worsening Chronicity:  New Context comment:  Constipation Relieved by:  Nothing Worsened by:  Nothing tried Ineffective treatments:  None tried Associated symptoms: constipation   Associated symptoms: no anorexia, no cough, no dysuria, no nausea and no vomiting     Past Medical History  Diagnosis Date  . Diabetes mellitus   . Pneumonia 02/2012    Strep pneumoniae bilateral pneumonia complicated by bacteremia  . Bacteremia   . Hypertension    Past Surgical History  Procedure Laterality Date  . Esophagogastroduodenoscopy  03/31/2012    Procedure: ESOPHAGOGASTRODUODENOSCOPY (EGD);  Surgeon: Beryle Beams, MD;  [facility];  Service: Endoscopy;  Laterality: N/A;  . Colonoscopy  04/01/2012    Procedure: COLONOSCOPY;  Surgeon: Juanita Craver, MD;  Location[facility];  Service: Endoscopy;  Laterality: N/A;  . Chest tube insertion      placed during hospitalization 02/2012   Family History  Problem Relation Age of Onset  . Diabetes Maternal Grandmother    History  Substance Use Topics  . Smoking status: Never Smoker   . Smokeless tobacco: Never Used  . Alcohol Use: Yes     Comment: rare     Review of Systems  Respiratory: Negative for cough.   Gastrointestinal: Positive for abdominal pain and constipation. Negative for nausea, vomiting and anorexia.  Genitourinary: Negative for dysuria.  All other systems reviewed  and are negative.     Allergies  Penicillins  Home Medications   Prior to Admission medications   Medication Sig Start Date End Date Taking? Authorizing Provider  aspirin 81 MG tablet Take 81 mg by mouth every morning.     Historical Provider, MD  cephALEXin (KEFLEX) 500 MG capsule Take 1 capsule (500 mg total) by mouth 4 (four) times daily. 06/16/14   [name], MD  ciprofloxacin (CILOXAN) 0.3 % ophthalmic solution 1 drop every 4 (four) hours. 10/24/13   Historical Provider, MD  HYDROcodone-acetaminophen (NORCO) 5-325 MG per tablet Take 1-2 tablets by mouth every 6 (six) hours as needed. 06/16/14   [name], MD  insulin aspart (NOVOLOG) 100 UNIT/ML injection Inject 11 Units into the skin 3 (three) times daily before meals. Based on sliding scale. 03/16/12   [name] Rumpf [place], PA-C  insulin glargine (LANTUS) 100 UNIT/ML injection Inject 40 Units into the skin at bedtime. 04/14/12   Nita Sells, MD  losartan-hydrochlorothiazide (HYZAAR) 50-12.5 MG per tablet Take 1 tablet by mouth daily. 05/19/15   Debby Freiberg, MD  polyethylene glycol [facility] [place]) packet Take 17 g by mouth daily. 05/19/15   Debby Freiberg, MD  prednisoLONE acetate (PRED FORTE) 1 % ophthalmic suspension 1 drop every 4 (four) hours. 10/24/13   Historical Provider, MD  rosuvastatin (CRESTOR) 10 MG tablet Take 10 mg by mouth every morning.     Historical Provider, MD  SIMVASTATIN PO Take by mouth.    Historical Provider, MD   BP 189/102 mmHg  Pulse 82  Temp(Src) 97.6  F (36.4 C) (Oral)  Resp 18  Ht 6\' 3"  (1.905 m)  Wt 205 lb (92.987 kg)  BMI 25.62 kg/m2  SpO2 98% Physical Exam  Constitutional: He is oriented to person, place, and time. He appears well-developed and well-nourished.  HENT:  Head: Normocephalic and atraumatic.  Eyes: Conjunctivae and EOM are normal.  Neck: Normal range of motion. Neck supple.  Cardiovascular: Normal rate, regular rhythm and normal heart sounds.   Pulmonary/Chest:  Effort normal and breath sounds normal. No respiratory distress.  Abdominal: He exhibits no distension. There is no tenderness. There is no rebound and no guarding.  Genitourinary: Rectal exam shows no external hemorrhoid and no internal hemorrhoid.  Fecal impaction present  Musculoskeletal: Normal range of motion.  Neurological: He is alert and oriented to person, place, and time.  Skin: Skin is warm and dry.  Vitals reviewed.   ED Course  Procedures (including critical care time) Labs Review Labs Reviewed - No data to display  Imaging Review No results found.   EKG Interpretation None      MDM   Final diagnoses:  Fecal impaction    49 y.o. male with pertinent PMH of DM, HTN presents with fecal impaction as above.  No signs of SBO, no nausea/vomiting, and no leakage of stool.  Broke up fecal impaction digitally and pt refused further rectal.  Given fleets enema.  Will dc home with pcp fu.  DC home in stable condition.    I have reviewed all laboratory and imaging studies if ordered as above  1. Fecal impaction         Debby Freiberg, MD 05/19/15 [phone]

## 2016-04-05 ENCOUNTER — Encounter (HOSPITAL_BASED_OUTPATIENT_CLINIC_OR_DEPARTMENT_OTHER): Payer: Self-pay | Admitting: Emergency Medicine

## 2016-04-05 ENCOUNTER — Emergency Department (HOSPITAL_BASED_OUTPATIENT_CLINIC_OR_DEPARTMENT_OTHER): Payer: Managed Care, Other (non HMO)

## 2016-04-05 ENCOUNTER — Emergency Department (HOSPITAL_BASED_OUTPATIENT_CLINIC_OR_DEPARTMENT_OTHER)
Admission: EM | Admit: 2016-04-05 | Discharge: 2016-04-05 | Disposition: A | Discharge status: Home / Self Care | Payer: Managed Care, Other (non HMO) | Attending: Emergency Medicine | Admitting: Emergency Medicine

## 2016-04-05 DIAGNOSIS — E119 Type 2 diabetes mellitus without complications: Secondary | ICD-10-CM | POA: Diagnosis not present

## 2016-04-05 DIAGNOSIS — Z8701 Personal history of pneumonia (recurrent): Secondary | ICD-10-CM | POA: Diagnosis not present

## 2016-04-05 DIAGNOSIS — M7989 Other specified soft tissue disorders: Secondary | ICD-10-CM | POA: Insufficient documentation

## 2016-04-05 DIAGNOSIS — Z7982 Long term (current) use of aspirin: Secondary | ICD-10-CM | POA: Diagnosis not present

## 2016-04-05 DIAGNOSIS — Z79899 Other long term (current) drug therapy: Secondary | ICD-10-CM | POA: Diagnosis not present

## 2016-04-05 DIAGNOSIS — Z794 Long term (current) use of insulin: Secondary | ICD-10-CM | POA: Insufficient documentation

## 2016-04-05 DIAGNOSIS — M79674 Pain in right toe(s): Secondary | ICD-10-CM | POA: Insufficient documentation

## 2016-04-05 DIAGNOSIS — I10 Essential (primary) hypertension: Secondary | ICD-10-CM | POA: Diagnosis not present

## 2016-04-05 DIAGNOSIS — Z880 Allergy status to penicillin: Secondary | ICD-10-CM | POA: Diagnosis not present

## 2016-04-05 DIAGNOSIS — M79671 Pain in right foot: Secondary | ICD-10-CM | POA: Diagnosis present

## 2016-04-05 MED ORDER — LIDOCAINE 4 % EX CREA
TOPICAL_CREAM | Freq: Once | CUTANEOUS | Status: AC
  Administered 2016-04-05: 1 via TOPICAL
  Filled 2016-04-05: qty 5

## 2016-04-05 MED ORDER — GABAPENTIN 100 MG PO CAPS: 100.0000 mg | ORAL_CAPSULE | Freq: Three times a day (TID) | ORAL | Status: DC

## 2016-04-05 MED ORDER — OXYCODONE-ACETAMINOPHEN 5-325 MG PO TABS: 1.0000 | ORAL_TABLET | Freq: Four times a day (QID) | ORAL | Status: DC | PRN

## 2016-04-05 MED FILL — GABAPENTIN 100 MG CAPSULE: 100 | 10 days supply | Qty: 30 | Fill #0

## 2016-04-05 MED FILL — OXYCODONE/APAP 5-325: 5-325 | 3 days supply | Qty: 10 | Fill #0

## 2016-04-05 NOTE — Discharge Instructions (Signed)
Pain Without a Known Cause WHAT IS PAIN WITHOUT A KNOWN CAUSE? Pain can occur in any part of the body and can range from mild to severe. Sometimes no cause can be found for why you are having pain. Some types of pain that can occur without a known cause include:   Headache.  Back pain.  Abdominal pain.  Neck pain. HOW IS PAIN WITHOUT A KNOWN CAUSE DIAGNOSED?  Your health care provider will try to find the cause of your pain. This may include:  Physical exam.  Medical history.  Blood tests.  Urine tests.  X-rays. If no cause is found, your health care provider may diagnose you with pain without a known cause.  IS THERE TREATMENT FOR PAIN WITHOUT A CAUSE?  Treatment depends on the kind of pain you have. Your health care provider may prescribe medicines to help relieve your pain.  WHAT CAN I DO AT HOME FOR MY PAIN?   Take medicines only as directed by your health care provider.  Stop any activities that cause pain. During periods of severe pain, bed rest may help.  Try to reduce your stress with activities such as yoga or meditation. Talk to your health care provider for other stress-reducing activity recommendations.  Exercise regularly, if approved by your health care provider.  Eat a healthy diet that includes fruits and vegetables. This may improve pain. Talk to your health care provider if you have any questions about your diet. WHAT IF MY PAIN DOES NOT GET BETTER?  If you have a painful condition and no reason can be found for the pain or the pain gets worse, it is important to follow up with your health care provider. It may be necessary to repeat tests and look further for a possible cause.    This information is not intended to replace advice given to you by your health care provider. Make sure you discuss any questions you have with your health care provider.   Document Released: 09/03/2001 Document Revised: 12/30/2014 Document Reviewed: 04/26/2014 Elsevier  Interactive Patient Education 2016 Elsevier Inc.  Musculoskeletal Pain Musculoskeletal pain is muscle and boney aches and pains. These pains can occur in any part of the body. Your caregiver may treat you without knowing the cause of the pain. They may treat you if blood or urine tests, X-rays, and other tests were normal.  CAUSES There is often not a definite cause or reason for these pains. These pains may be caused by a type of germ (virus). The discomfort may also come from overuse. Overuse includes working out too hard when your body is not fit. Boney aches also come from weather changes. Bone is sensitive to atmospheric pressure changes. HOME CARE INSTRUCTIONS   Ask when your test results will be ready. Make sure you get your test results.  Only take over-the-counter or prescription medicines for pain, discomfort, or fever as directed by your caregiver. If you were given medications for your condition, do not drive, operate machinery or power tools, or sign legal documents for 24 hours. Do not drink alcohol. Do not take sleeping pills or other medications that may interfere with treatment.  Continue all activities unless the activities cause more pain. When the pain lessens, slowly resume normal activities. Gradually increase the intensity and duration of the activities or exercise.  During periods of severe pain, bed rest may be helpful. Lay or sit in any position that is comfortable.  Putting ice on the injured area.  Put ice  in a bag.  Place a towel between your skin and the bag.  Leave the ice on for 15 to 20 minutes, 3 to 4 times a day.  Follow up with your caregiver for continued problems and no reason can be found for the pain. If the pain becomes worse or does not go away, it may be necessary to repeat tests or do additional testing. Your caregiver may need to look further for a possible cause. SEEK IMMEDIATE MEDICAL CARE IF:  You have pain that is getting worse and is not  relieved by medications.  You develop chest pain that is associated with shortness or breath, sweating, feeling sick to your stomach (nauseous), or throw up (vomit).  Your pain becomes localized to the abdomen.  You develop any new symptoms that seem different or that concern you. MAKE SURE YOU:   Understand these instructions.  Will watch your condition.  Will get help right away if you are not doing well or get worse.   This information is not intended to replace advice given to you by your health care provider. Make sure you discuss any questions you have with your health care provider.   Document Released: 12/09/2005 Document Revised: 03/02/2012 Document Reviewed: 08/13/2013 Elsevier Interactive Patient Education [employer].

## 2016-04-05 NOTE — ED Provider Notes (Signed)
CSN: [id number]     Arrival date & time 04/05/16  1026 History   First MD Initiated Contact with Patient 04/05/16 1123     Chief Complaint  Patient presents with  . Foot Pain    Patient is a 50 y.o. male presenting with lower extremity pain. The history is provided by the patient. No language interpreter was used.  Foot Pain   LEVELLE BALSAMO is a 50 y.o. male who presents to the Emergency Department complaining of toe pain.  She has a history of diabetes and takes insulin at home. He works in [employer]ologist, educational, third shift. This morning after he got off of work he developed a sharp pain in his right second toe. The pain is constant and sharp but pulsates at times.  It does not leave his toe.  He has no known injuries to that area but he did recently change orthotics. He was placed on amoxicillin a week ago for a dental infection. He also notes that 2 days ago he developed some swelling in his left hand. The swelling in his left hand has not changed. He has no local pain. He denies any fevers, chest pain, shortness breath, abdominal pain.  Past Medical History  Diagnosis Date  . Diabetes mellitus   . Pneumonia 02/2012    Strep pneumoniae bilateral pneumonia complicated by bacteremia  . Bacteremia   . Hypertension    Past Surgical History  Procedure Laterality Date  . Esophagogastroduodenoscopy  03/31/2012    Procedure: ESOPHAGOGASTRODUODENOSCOPY (EGD);  Surgeon: Beryle Beams, MD;  Location: [facility] ENDOSCOPY;  Service: Endoscopy;  Laterality: N/A;  . Colonoscopy  04/01/2012    Procedure: COLONOSCOPY;  Surgeon: Juanita Craver, MD;  Location: [facility] ENDOSCOPY;  Service: Endoscopy;  Laterality: N/A;  . Chest tube insertion      placed during hospitalization 02/2012   Family History  Problem Relation Age of Onset  . Diabetes Maternal Grandmother    Social History  Substance Use Topics  . Smoking status: Never Smoker   . Smokeless tobacco: Never Used  . Alcohol Use: Yes     Comment: rare      Review of Systems  All other systems reviewed and are negative.     Allergies  Penicillins  Home Medications   Prior to Admission medications   Medication Sig Start Date End Date Taking? Authorizing Provider  amLODipine (NORVASC) 5 MG tablet Take 5 mg by mouth daily.   Yes Historical Provider, MD  aspirin 81 MG tablet Take 81 mg by mouth every morning.    Yes Historical Provider, MD  ibuprofen (ADVIL,MOTRIN) 200 MG tablet Take 200 mg by mouth every 6 (six) hours as needed.   Yes Historical Provider, MD  insulin aspart (NOVOLOG) 100 UNIT/ML injection Inject 11 Units into the skin 3 (three) times daily before meals. Based on sliding scale. 03/16/12  Yes Marianne L York, PA-C  insulin regular (NOVOLIN R,HUMULIN R) 100 units/mL injection Inject into the skin 3 (three) times daily before meals.   Yes Historical Provider, MD  losartan-hydrochlorothiazide (HYZAAR) 50-12.5 MG per tablet Take 1 tablet by mouth daily. 05/19/15  Yes Debby Freiberg, MD  gabapentin (NEURONTIN) 100 MG capsule Take 1 capsule (100 mg total) by mouth 3 (three) times daily. 04/05/16   Quintella Reichert, MD  oxyCODONE-acetaminophen (PERCOCET/ROXICET) 5-325 MG tablet Take 1 tablet by mouth every 6 (six) hours as needed for severe pain. 04/05/16   Quintella Reichert, MD   BP 152/76 mmHg  Pulse 80  Temp(Src)  98.3 F (36.8 C) (Oral)  Resp 18  Ht 6\' 2"  (1.88 m)  Wt 200 lb (90.719 kg)  BMI 25.67 kg/m2  SpO2 99% Physical Exam  Constitutional: He is oriented to person, place, and time. He appears well-developed and well-nourished.  HENT:  Head: Normocephalic and atraumatic.  Cardiovascular: Normal rate and regular rhythm.   No murmur heard. Pulmonary/Chest: Effort normal and breath sounds normal. No respiratory distress.  Musculoskeletal:  There is mild diffuse swelling to the left dorsal hand without any erythema or tenderness. Full range of motion is intact throughout the digits of the left hand. 2+ DP pulses bilaterally.  The right foot is without erythema or edema. There is no tenderness to palpation throughout the foot and toes on the right. He has no wound or rash on the right foot. The digits of the foot are well perfused with brisk capillary refill. There is a small area of ecchymosis on the right great toe that is nontender to palpation.  Neurological: He is alert and oriented to person, place, and time.  Skin: Skin is warm and dry.  Psychiatric: He has a normal mood and affect. His behavior is normal.  Nursing note and vitals reviewed.   ED Course  Procedures (including critical care time) Labs Review Labs Reviewed - No data to display  Imaging Review Dg Toe 2nd Right  04/05/2016  CLINICAL DATA:  Right second toe pain.  No known injury. EXAM: RIGHT SECOND TOE COMPARISON:  MRI 03/16/2013. FINDINGS: Diffuse mild degenerative change. No acute bony or joint abnormality identified. No evidence of fracture or dislocation. No radiopaque foreign body. IMPRESSION: Diffuse mild degenerative change.  No acute abnormality. Electronically Signed   By: Marcello Moores  Register   On: 04/05/2016 12:00   I have personally reviewed and evaluated these images and lab results as part of my medical decision-making.   EKG Interpretation None      MDM   Final diagnoses:  Pain of toe of right foot    Patient with history of diabetes here for evaluation of right second toe pain. Patient is well perfused on examination with no evidence of acute infectious process or poor tissue perfusion. No evidence of acute fracture. There is a bruise to the great toe that appears as if he may have injured that in some way but he has no recollection of events. Discussed homecare for pain of unclear origin, outpatient follow-up, return precautions    Quintella Reichert, MD 04/06/16 0700

## 2016-04-05 NOTE — ED Notes (Signed)
Patient transported to X-ray 

## 2016-04-05 NOTE — ED Notes (Signed)
Pt with acute onset of left hand swelling, thought it was a bug bite, went on about his normal adl's, this am after getting off work had severe sharp pains to right second toe, pt just bought brand new boots, he thought that was cause of pain, but pain has continued after soaking foot at home and 2 advif.l

## 2016-04-09 ENCOUNTER — Other Ambulatory Visit: Payer: Self-pay | Admitting: Family Medicine

## 2016-04-09 DIAGNOSIS — R609 Edema, unspecified: Secondary | ICD-10-CM

## 2016-04-09 DIAGNOSIS — M79602 Pain in left arm: Secondary | ICD-10-CM

## 2016-04-09 DIAGNOSIS — M79642 Pain in left hand: Secondary | ICD-10-CM

## 2016-04-10 ENCOUNTER — Ambulatory Visit
Admission: RE | Admit: 2016-04-10 | Discharge: 2016-04-10 | Disposition: A | Discharge status: Home / Self Care | Payer: Managed Care, Other (non HMO) | Source: Ambulatory Visit | Attending: Family Medicine | Admitting: Family Medicine

## 2016-04-10 DIAGNOSIS — M79602 Pain in left arm: Secondary | ICD-10-CM

## 2016-04-10 DIAGNOSIS — M79642 Pain in left hand: Secondary | ICD-10-CM

## 2016-04-10 DIAGNOSIS — R609 Edema, unspecified: Secondary | ICD-10-CM

## 2016-11-16 ENCOUNTER — Ambulatory Visit (INDEPENDENT_AMBULATORY_CARE_PROVIDER_SITE_OTHER): Payer: 59 | Admitting: Family Medicine

## 2016-11-16 VITALS — BP 124/70 | HR 92 | Temp 98.3°F | Resp 18 | Ht 74.0 in | Wt 204.0 lb

## 2016-11-16 DIAGNOSIS — L0291 Cutaneous abscess, unspecified: Secondary | ICD-10-CM

## 2016-11-16 DIAGNOSIS — Z23 Encounter for immunization: Secondary | ICD-10-CM

## 2016-11-16 MED ORDER — DOXYCYCLINE HYCLATE 100 MG PO CAPS: 100.0000 mg | ORAL_CAPSULE | Freq: Two times a day (BID) | ORAL | 0 refills | Status: DC

## 2016-11-16 NOTE — Progress Notes (Signed)
Patient ID: [name] [name], male    DOB: 10/18/1966, 50 y.o.   MRN: [id number]  PCP: [name], MD (Inactive)  Chief Complaint  Patient presents with  . Cyst    ABCESS UNDER LEFT ARM  . Flu Vaccine    Subjective:   HPI 50 year old male presents for evaluation of an abscess under left arm times 4 days. Patient is new to [facility]. Chronic problems include hypertension, insulin dependent diabetes, and chronic kidney disease. Diabetes is managed by endocrinology. Reports blood sugars range 180-200. Current A1C 8.1. Patient reports noticing a small bump under his left arm which has increased in diameter, become tender to touch, and red. Denies recent changes in deodorant changes or soap.   Social History   Social History  . Marital status: Divorced    Spouse name: N/A  . Number of children: N/A  . Years of education: N/A   Occupational History  . MACHINIST [employer]   Social History Main Topics  . Smoking status: Never Smoker  . Smokeless tobacco: Never Used  . Alcohol use Yes     Comment: rare   . Drug use: No  . Sexual activity: Not on file   Other Topics Concern  . Not on file   Social History Narrative   He is from [place] and lives at home here with his daughter. He is divorced. 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     Family History  Problem Relation Age of Onset  . Diabetes Maternal Grandmother     Review of Systems See HPI  Patient Active Problem List   Diagnosis Date Noted  . Essential hypertension, benign 10/26/2013  . Dyspnea 06/09/2012  . Dizziness 06/09/2012  . Pneumonia, organism unspecified(486) 04/07/2012  . Pleural effusion 04/07/2012  . Parapneumonic effusion 03/28/2012  . Anemia 03/28/2012  . Hypoalbuminemia 03/28/2012  . Total bilirubin, elevated 03/28/2012  . Bilateral leg edema 03/23/2012  . Cyst of skin 03/23/2012  . Bacteremia due to Streptococcus pneumoniae 03/15/2012    . Hyponatremia 03/15/2012  . Acute kidney failure 03/13/2012  . Elevated LFTs 03/13/2012  . Pneumococcal pneumonia ([place]) 03/12/2012  . Diabetes mellitus 03/12/2012     Prior to Admission medications   Medication Sig Start Date End Date Taking? Authorizing Provider  amLODipine (NORVASC) 5 MG tablet Take 5 mg by mouth daily.   Yes Historical Provider, MD  aspirin 81 MG tablet Take 81 mg by mouth every morning.    Yes Historical Provider, MD  gabapentin (NEURONTIN) 100 MG capsule Take 1 capsule (100 mg total) by mouth 3 (three) times daily. 04/05/16  Yes Quintella Reichert, MD  ibuprofen (ADVIL,MOTRIN) 200 MG tablet Take 200 mg by mouth every 6 (six) hours as needed.   Yes Historical Provider, MD  insulin aspart (NOVOLOG) 100 UNIT/ML injection Inject 11 Units into the skin 3 (three) times daily before meals. Based on sliding scale. 03/16/12  Yes Marianne L York, PA-C  insulin regular (NOVOLIN R,HUMULIN R) 100 units/mL injection Inject into the skin 3 (three) times daily before meals.   Yes Historical Provider, MD  losartan-hydrochlorothiazide (HYZAAR) 50-12.5 MG per tablet Take 1 tablet by mouth daily. Patient not taking: Reported on 11/16/2016 05/19/15   Debby Freiberg, MD  oxyCODONE-acetaminophen (PERCOCET/ROXICET) 5-325 MG tablet Take 1 tablet by mouth every 6 (six) hours as needed for severe pain. Patient not taking: Reported on 11/16/2016 04/05/16   Quintella Reichert, MD  Allergies  Allergen Reactions  . Penicillins Rash    Tolerating Ceftriaxone (03/12/12)       Objective:  Physical Exam  Constitutional: He appears well-developed and well-nourished.  HENT:  Head: Normocephalic and atraumatic.  Eyes: Conjunctivae are normal. Pupils are equal, round, and reactive to light.  Neck: Normal range of motion. Neck supple.  Cardiovascular: Normal rate.   Pulmonary/Chest: Effort normal and breath sounds normal.  Lymphadenopathy:    He has no cervical adenopathy.  Skin: There is erythema.   Left Axilla  Warm, enlarged, tender to touch, abscess in medial region of the axilla. Erythremic base, with mildly fluctuant top surface.    Drained Wound Abscess Verbal informed consent obtained.   Local anesthesia used 1% with epinephrine. Cleaned with alcohol swab.Prepped with betadine incised with # 11. Large amount of purulent exudate mixed with serosanguineous debris was extracted. Sebaceous material removed from wound bed. Wound packed and bandage applied and secured with Coban.  Vitals:   11/16/16 0806  BP: 124/70  Pulse: 92  Resp: 18  Temp: 98.3 F (36.8 C)   Assessment & Plan:  1. Abscess, drained. -Patient tolerated. - Doxycyline 100 mg twice daily for 10 days. -Wound care instructions provided.  2. Need for prophylactic vaccination and inoculation against influenza - Flu Vaccine QUAD 36+ mos IM  Return for follow-up 11/18/2016 and see Tenna Delaine.  Carroll Sage. Kenton Kingfisher, MSN, FNP-C [facility]

## 2016-11-16 NOTE — Patient Instructions (Addendum)
Schedule a wound care follow-up with [place].  Start Doxycycline 100 mg twice daily for 10 days for skin infection.   WOUND CARE Please return in 2 days to have your wound re-evaluated . Marland Kitchen Keep area clean and dry for 24 hours. Do not remove bandage, if applied. . After 24 hours, remove bandage and wash wound gently with mild soap and warm water. Reapply a new bandage after cleaning wound, if directed. . Continue daily cleansing with soap and water until stitches/staples are removed. . Do not apply any ointments or creams to the wound as this may cause delayed healing. . Notify the office if you experience any of the following signs of infection: Swelling, redness, pus drainage, streaking, fever >101.0 F . Notify the office if you experience excessive bleeding that does not stop after 15-20 minutes of constant, firm pressure.    IF you received an x-ray today, you will receive an invoice from [facility]. Please contact [facility] at [phone] with questions or concerns regarding your invoice.   IF you received labwork today, you will receive an invoice from [employer]. Please contact [facility] at [phone] with questions or concerns regarding your invoice.   Our billing staff will not be able to assist you with questions regarding bills from these companies.  You will be contacted with the lab results as soon as they are available. The fastest way to get your results is to activate your My Chart account. Instructions are located on the last page of this paperwork. If you have not heard from [place] regarding the results in 2 weeks, please contact this office.

## 2016-11-18 ENCOUNTER — Encounter: Payer: Self-pay | Admitting: Physician Assistant

## 2016-11-18 ENCOUNTER — Ambulatory Visit (INDEPENDENT_AMBULATORY_CARE_PROVIDER_SITE_OTHER): Payer: 59 | Admitting: Physician Assistant

## 2016-11-18 DIAGNOSIS — L0291 Cutaneous abscess, unspecified: Secondary | ICD-10-CM

## 2016-11-18 NOTE — Patient Instructions (Signed)
Use a warm compress to the area. You can take an old sock and fill it with uncooked rice and heat it in the microwave for 45 seconds to make a heating pad. Keep the area covered with a bandage. You can shower with the bandage on and when it becomes soaked, change the bandage. Continue to take the antibiotics as prescribed. Follow up if the wound is not completley healed after you complete the antibiotics or if you develop worsening pain, fever, chills, redness, or warmth from the area.

## 2016-11-18 NOTE — Progress Notes (Signed)
    MRN: [id number] DOB: 1966-10-30  Subjective:   [name] [name] is a 50 y.o. male presenting for follow up on abscess I&D. Denies pain, purulent drainage, and fever. Had a little bit of nausea and chills the day after it was drained. He has been taking the antibiotics as prescribed. Notes that he did not eat enough the first day he took them so that is probably why he felt nauseous.   [name] [name] has a current medication list which includes the following prescription(s): amlodipine, aspirin, doxycycline, gabapentin, ibuprofen, insulin aspart, insulin regular, losartan-hydrochlorothiazide, and oxycodone-acetaminophen. Also is allergic to penicillins.  [name] [name]  has a past medical history of Bacteremia; Diabetes mellitus; Hypertension; and Pneumonia (02/2012). Also  has a past surgical history that includes Esophagogastroduodenoscopy (03/31/2012); Colonoscopy (04/01/2012); and Chest tube insertion.   Objective:   Vitals: There were no vitals taken for this visit.  Physical Exam  Constitutional: He is oriented to person, place, and time. He appears well-developed and well-nourished.  HENT:  Head: Normocephalic and atraumatic.  Eyes: Conjunctivae are normal.  Neck: Normal range of motion.  Pulmonary/Chest: Effort normal.  Neurological: He is alert and oriented to person, place, and time.  Skin: Skin is warm and dry.     Psychiatric: He has a normal mood and affect.  Vitals reviewed.  Wound care: Wound cavity was cleansed with 1% lidocaine. No purulent discharge expressed. Depth of wound cavity was approximately 84mm and therefore no further packing was placed. Wound cleansed and dressing applied.   No results found for this or any previous visit (from the past 24 hour(s)).  Assessment and Plan :  1. Abscess Wound care instructions given. -Return to clinic if symptoms worsen, do not improve after completion of antibiotics, or as needed  Tenna Delaine, PA-C  [facility] 11/18/2016 8:08 AM

## 2017-01-13 ENCOUNTER — Ambulatory Visit (INDEPENDENT_AMBULATORY_CARE_PROVIDER_SITE_OTHER): Payer: 59 | Admitting: Emergency Medicine

## 2017-01-13 VITALS — BP 176/100 | HR 93 | Temp 98.5°F | Resp 18 | Ht 74.0 in | Wt 198.0 lb

## 2017-01-13 DIAGNOSIS — D638 Anemia in other chronic diseases classified elsewhere: Secondary | ICD-10-CM

## 2017-01-13 DIAGNOSIS — R002 Palpitations: Secondary | ICD-10-CM | POA: Diagnosis not present

## 2017-01-13 DIAGNOSIS — Z794 Long term (current) use of insulin: Secondary | ICD-10-CM

## 2017-01-13 DIAGNOSIS — E1122 Type 2 diabetes mellitus with diabetic chronic kidney disease: Secondary | ICD-10-CM

## 2017-01-13 DIAGNOSIS — N184 Chronic kidney disease, stage 4 (severe): Secondary | ICD-10-CM

## 2017-01-13 DIAGNOSIS — I10 Essential (primary) hypertension: Secondary | ICD-10-CM | POA: Diagnosis not present

## 2017-01-13 DIAGNOSIS — N185 Chronic kidney disease, stage 5: Secondary | ICD-10-CM

## 2017-01-13 LAB — POCT CBC
Granulocyte percent: 69.3 %G (ref 37–80)
HCT, POC: 29.7 % — AB (ref 43.5–53.7)
Hemoglobin: 10.6 g/dL — AB (ref 14.1–18.1)
Lymph, poc: 1.9 (ref 0.6–3.4)
MCH, POC: 28.7 pg (ref 27–31.2)
MCHC: 35.6 g/dL — AB (ref 31.8–35.4)
MCV: 80.7 fL (ref 80–97)
MID (cbc): 0.6 (ref 0–0.9)
MPV: 9.3 fL (ref 0–99.8)
POC Granulocyte: 5.6 (ref 2–6.9)
POC LYMPH PERCENT: 23.2 %L (ref 10–50)
POC MID %: 7.5 %M (ref 0–12)
Platelet Count, POC: 221 10*3/uL (ref 142–424)
RBC: 3.68 M/uL — AB (ref 4.69–6.13)
RDW, POC: 12.7 %
WBC: 8.1 10*3/uL (ref 4.6–10.2)

## 2017-01-13 LAB — POCT URINALYSIS DIP (MANUAL ENTRY)
Bilirubin, UA: NEGATIVE
Glucose, UA: 500 — AB
Ketones, POC UA: NEGATIVE
Leukocytes, UA: NEGATIVE
Nitrite, UA: NEGATIVE
Protein Ur, POC: >=300 — AB
Spec Grav, UA: 1.025
Urobilinogen, UA: 0.2
pH, UA: 6

## 2017-01-13 LAB — POCT GLYCOSYLATED HEMOGLOBIN (HGB A1C): Hemoglobin A1C: 13.1

## 2017-01-13 NOTE — Patient Instructions (Addendum)
     IF you received an x-ray today, you will receive an invoice from [facility]. Please contact [facility] at [phone] with questions or concerns regarding your invoice.   IF you received labwork today, you will receive an invoice from [facility]. Please contact [facility] at [phone] with questions or concerns regarding your invoice.   Our billing staff will not be able to assist you with questions regarding bills from these companies.  You will be contacted with the lab results as soon as they are available. The fastest way to get your results is to activate your My Chart account. Instructions are located on the last page of this paperwork. If you have not heard from [place] regarding the results in 2 weeks, please contact this office.      Palpitations Introduction A palpitation is the feeling that your heart:  Has an uneven (irregular) heartbeat.  Is beating faster than normal.  Is fluttering.  Is skipping a beat. This is usually not a serious problem. In some cases, you may need more medical tests. Follow these instructions at home:  Avoid:  Caffeine in coffee, tea, soft drinks, diet pills, and energy drinks.  Chocolate.  Alcohol.  Do not use any tobacco products. These include cigarettes, chewing tobacco, and e-cigarettes. If you need help quitting, ask your doctor.  Try to reduce your stress. These things may help:  Yoga.  Meditation.  Physical activity. Swimming, jogging, and walking are good choices.  A method that helps you use your mind to control things in your body, like heartbeats (biofeedback).  Get plenty of rest and sleep.  Take over-the-counter and prescription medicines only as told by your doctor.  Keep all follow-up visits as told by your doctor. This is important. Contact a doctor if:  Your heartbeat is still fast or uneven after 24 hours.  Your palpitations occur more often. Get help right away if:  You have chest  pain.  You feel short of breath.  You have a very bad headache.  You feel dizzy.  You pass out (faint). This information is not intended to replace advice given to you by your health care provider. Make sure you discuss any questions you have with your health care provider. Document Released: 09/17/2008 Document Revised: 05/16/2016 Document Reviewed: 08/24/2015  2017 Elsevier

## 2017-01-13 NOTE — Progress Notes (Signed)
[name] [name] 51 y.o.   Chief Complaint  Patient presents with  . Heart Problem    Patient feels like heart is skipping a beat every once in a while   . Headache    dull pain in head.    HISTORY OF PRESENT ILLNESS: This is a 51 y.o. male complaining of palpitations since last Friday; denies syncope, chest pain, SOB, fever, n/v; is diabetic and has h/o CKD (stage 5). Had blood work done last March 2016. Has seen Nephrologist In the past but lost medical insurance and hasn't being able to f/u.  Palpitations   This is a new problem. The current episode started in the past 7 days. The problem occurs intermittently. The problem has been waxing and waning. Nothing aggravates the symptoms. Associated symptoms include an irregular heartbeat. Pertinent negatives include no anxiety, chest fullness, chest pain, coughing, diaphoresis, dizziness, fever, nausea, near-syncope, numbness, shortness of breath, syncope, vomiting or weakness. Associated symptoms comments: headache. He has tried nothing for the symptoms. Risk factors include diabetes mellitus. There is no history of anemia, drug use, heart disease, hyperthyroidism or a valve disorder. DM     Prior to Admission medications   Medication Sig Start Date End Date Taking? Authorizing Provider  amLODipine (NORVASC) 5 MG tablet Take 10 mg by mouth daily.    Yes Historical Provider, MD  aspirin 81 MG tablet Take 81 mg by mouth every morning.    Yes Historical Provider, MD  carvedilol (COREG) 3.125 MG tablet Take 3.125 mg by mouth 2 (two) times daily with a meal.   Yes Historical Provider, MD  furosemide (LASIX) 40 MG tablet Take 40 mg by mouth.   Yes Historical Provider, MD  insulin aspart (NOVOLOG) 100 UNIT/ML injection Inject 11 Units into the skin 3 (three) times daily before meals. Based on sliding scale. 03/16/12  Yes Marianne L York, PA-C  insulin regular (NOVOLIN R,HUMULIN R) 100 units/mL injection Inject into the skin 3 (three) times  daily before meals.   Yes Historical Provider, MD  valsartan (DIOVAN) 320 MG tablet Take 320 mg by mouth daily.   Yes Historical Provider, MD  doxycycline (VIBRAMYCIN) 100 MG capsule Take 1 capsule (100 mg total) by mouth 2 (two) times daily. Patient not taking: Reported on 01/13/2017 11/16/16   Sedalia Muta, FNP  gabapentin (NEURONTIN) 100 MG capsule Take 1 capsule (100 mg total) by mouth 3 (three) times daily. Patient not taking: Reported on 01/13/2017 04/05/16   Quintella Reichert, MD  ibuprofen (ADVIL,MOTRIN) 200 MG tablet Take 200 mg by mouth every 6 (six) hours as needed.    Historical Provider, MD  losartan-hydrochlorothiazide (HYZAAR) 50-12.5 MG per tablet Take 1 tablet by mouth daily. Patient not taking: Reported on 11/16/2016 05/19/15   Debby Freiberg, MD  oxyCODONE-acetaminophen (PERCOCET/ROXICET) 5-325 MG tablet Take 1 tablet by mouth every 6 (six) hours as needed for severe pain. Patient not taking: Reported on 11/16/2016 04/05/16   Quintella Reichert, MD    Allergies  Allergen Reactions  . Penicillins Rash    Tolerating Ceftriaxone (03/12/12)    Patient Active Problem List   Diagnosis Date Noted  . Essential hypertension, benign 10/26/2013  . Dyspnea 06/09/2012  . Dizziness 06/09/2012  . Pneumonia, organism unspecified(486) 04/07/2012  . Pleural effusion 04/07/2012  . Parapneumonic effusion 03/28/2012  . Anemia 03/28/2012  . Hypoalbuminemia 03/28/2012  . Total bilirubin, elevated 03/28/2012  . Bilateral leg edema 03/23/2012  . Cyst of skin 03/23/2012  . Bacteremia due to Streptococcus pneumoniae  03/15/2012  . Hyponatremia 03/15/2012  . Acute kidney failure 03/13/2012  . Elevated LFTs 03/13/2012  . Pneumococcal pneumonia ([place]) 03/12/2012  . Diabetes mellitus 03/12/2012    Past Medical History:  Diagnosis Date  . Bacteremia   . Diabetes mellitus   . Hypertension   . Pneumonia 02/2012   Strep pneumoniae bilateral pneumonia complicated by bacteremia    Past  Surgical History:  Procedure Laterality Date  . CHEST TUBE INSERTION     placed during hospitalization 02/2012  . COLONOSCOPY  04/01/2012   Procedure: COLONOSCOPY;  Surgeon: Juanita Craver, MD;  Location[facility] ENDOSCOPY;  Service: Endoscopy;  Laterality: N/A;  . ESOPHAGOGASTRODUODENOSCOPY  03/31/2012   Procedure: ESOPHAGOGASTRODUODENOSCOPY (EGD);  Surgeon: Beryle Beams, MD;  Location: [facility] ENDOSCOPY;  Service: Endoscopy;  Laterality: N/A;    Social History   Social History  . Marital status: Divorced    Spouse name: N/A  . Number of children: N/A  . Years of education: N/A   Occupational History  . MACHINIST [employer]   Social History Main Topics  . Smoking status: Never Smoker  . Smokeless tobacco: Never Used  . Alcohol use Yes     Comment: rare   . Drug use: No  . Sexual activity: Not on file   Other Topics Concern  . Not on file   Social History Narrative   He is from [place] and lives at home here with his daughter. He is divorced. 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     Family History  Problem Relation Age of Onset  . Diabetes Maternal Grandmother      Review of Systems  Constitutional: Negative.  Negative for chills, diaphoresis and fever.  HENT: Negative.  Negative for congestion, nosebleeds and sore throat.   Eyes: Negative for discharge and redness.  Respiratory: Negative.  Negative for cough, hemoptysis and shortness of breath.   Cardiovascular: Positive for palpitations. Negative for chest pain, syncope and near-syncope.  Gastrointestinal: Negative for abdominal pain, diarrhea, nausea and vomiting.  Genitourinary: Negative.  Negative for dysuria and hematuria.  Musculoskeletal: Negative for myalgias and neck pain.  Skin: Negative.  Negative for itching and rash.  Neurological: Negative for dizziness, focal weakness, loss of consciousness, weakness, numbness and headaches.  Endo/Heme/Allergies: Negative.     Psychiatric/Behavioral: Negative for depression. The patient is not nervous/anxious.   All other systems reviewed and are negative.  EKG: NSR, LVH, no acute ischemic changes. Vitals:   01/13/17 1128  BP: (!) 176/100  Pulse: 93  Resp: 18  Temp: 98.5 F (36.9 C)    Physical Exam  Constitutional: He is oriented to person, place, and time. He appears well-developed and well-nourished.  HENT:  Head: Normocephalic and atraumatic.  Nose: Nose normal.  Mouth/Throat: Oropharynx is clear and moist. No oropharyngeal exudate.  Eyes: Conjunctivae and EOM are normal. Pupils are equal, round, and reactive to light.  Neck: Normal range of motion. Neck supple. No JVD present.  Cardiovascular: Normal rate, regular rhythm and normal heart sounds.   +extrasystoles  Pulmonary/Chest: Effort normal and breath sounds normal.  Abdominal: Soft. Bowel sounds are normal. He exhibits no distension. There is no tenderness.  Musculoskeletal: Normal range of motion. He exhibits no tenderness.  Lymphadenopathy:    He has no cervical adenopathy.  Neurological: He is alert and oriented to person, place, and time. No sensory deficit. He exhibits normal muscle tone.  Skin: Skin is warm and dry.  Capillary refill takes less than 2 seconds.  Psychiatric: He has a normal mood and affect. His behavior is normal.  Vitals reviewed.    ASSESSMENT & PLAN: Will check electrolytes and consult Cardiology. Also needs F/U with Nephrologist this week. Increase Amlodipine to 10mg  daily. Rett was seen today for heart problem and headache.  Diagnoses and all orders for this visit:  Palpitations -     EKG 12-Lead -     POCT urinalysis dipstick -     POCT CBC -     Comprehensive metabolic panel -     POCT glycosylated hemoglobin (Hb A1C) -     Ambulatory referral to Cardiology  Essential hypertension, benign  Chronic kidney disease (CKD), stage IV (severe) (HCC)  Type 2 diabetes mellitus with stage 5 chronic kidney  disease not on chronic dialysis, with long-term current use of insulin (HCC)  Anemia of chronic disease    Patient Instructions       IF you received an x-ray today, you will receive an invoice from [facility]. Please contact [facility] at [phone] with questions or concerns regarding your invoice.   IF you received labwork today, you will receive an invoice from [facility]. Please contact [facility] at [phone] with questions or concerns regarding your invoice.   Our billing staff will not be able to assist you with questions regarding bills from these companies.  You will be contacted with the lab results as soon as they are available. The fastest way to get your results is to activate your My Chart account. Instructions are located on the last page of this paperwork. If you have not heard from [place] regarding the results in 2 weeks, please contact this office.      Palpitations Introduction A palpitation is the feeling that your heart:  Has an uneven (irregular) heartbeat.  Is beating faster than normal.  Is fluttering.  Is skipping a beat. This is usually not a serious problem. In some cases, you may need more medical tests. Follow these instructions at home:  Avoid:  Caffeine in coffee, tea, soft drinks, diet pills, and energy drinks.  Chocolate.  Alcohol.  Do not use any tobacco products. These include cigarettes, chewing tobacco, and e-cigarettes. If you need help quitting, ask your doctor.  Try to reduce your stress. These things may help:  Yoga.  Meditation.  Physical activity. Swimming, jogging, and walking are good choices.  A method that helps you use your mind to control things in your body, like heartbeats (biofeedback).  Get plenty of rest and sleep.  Take over-the-counter and prescription medicines only as told by your doctor.  Keep all follow-up visits as told by your doctor. This is important. Contact a doctor  if:  Your heartbeat is still fast or uneven after 24 hours.  Your palpitations occur more often. Get help right away if:  You have chest pain.  You feel short of breath.  You have a very bad headache.  You feel dizzy.  You pass out (faint). This information is not intended to replace advice given to you by your health care provider. Make sure you discuss any questions you have with your health care provider. Document Released: 09/17/2008 Document Revised: 05/16/2016 Document Reviewed: 08/24/2015  2017 Elsevier      [name] Caroli, MD [facility]

## 2017-01-14 ENCOUNTER — Telehealth: Payer: Self-pay | Admitting: Emergency Medicine

## 2017-01-14 LAB — COMPREHENSIVE METABOLIC PANEL
ALT: 13 IU/L (ref 0–44)
AST: 13 IU/L (ref 0–40)
Albumin/Globulin Ratio: 1.1 — ABNORMAL LOW (ref 1.2–2.2)
Albumin: 3.4 g/dL — ABNORMAL LOW (ref 3.5–5.5)
Alkaline Phosphatase: 132 IU/L — ABNORMAL HIGH (ref 39–117)
BUN/Creatinine Ratio: 17 (ref 9–20)
BUN: 63 mg/dL — ABNORMAL HIGH (ref 6–24)
Bilirubin Total: 0.2 mg/dL (ref 0.0–1.2)
CO2: 24 mmol/L (ref 18–29)
Calcium: 8.4 mg/dL — ABNORMAL LOW (ref 8.7–10.2)
Chloride: 93 mmol/L — ABNORMAL LOW (ref 96–106)
Creatinine, Ser: 3.65 mg/dL (ref 0.76–1.27)
GFR calc Af Amer: 21 mL/min/{1.73_m2} — ABNORMAL LOW (ref >59)
GFR calc non Af Amer: 18 mL/min/{1.73_m2} — ABNORMAL LOW (ref >59)
Globulin, Total: 3.2 g/dL (ref 1.5–4.5)
Glucose: 429 mg/dL — ABNORMAL HIGH (ref 65–99)
Potassium: 5.1 mmol/L (ref 3.5–5.2)
Sodium: 132 mmol/L — ABNORMAL LOW (ref 134–144)
Total Protein: 6.6 g/dL (ref 6.0–8.5)

## 2017-01-14 NOTE — Telephone Encounter (Signed)
Spoke to patient today 307pm about results and need for Nephrologist evaluation. Has appointment to see Nephrologist and Cardiologist tomorrow.

## 2017-01-14 NOTE — Telephone Encounter (Signed)
Called patient and left a message this am regarding abnormal renal function tests.

## 2017-01-15 ENCOUNTER — Ambulatory Visit (INDEPENDENT_AMBULATORY_CARE_PROVIDER_SITE_OTHER): Payer: 59 | Admitting: Cardiovascular Disease

## 2017-01-15 ENCOUNTER — Encounter: Payer: Self-pay | Admitting: Cardiovascular Disease

## 2017-01-15 DIAGNOSIS — R748 Abnormal levels of other serum enzymes: Secondary | ICD-10-CM

## 2017-01-15 DIAGNOSIS — I10 Essential (primary) hypertension: Secondary | ICD-10-CM | POA: Diagnosis not present

## 2017-01-15 DIAGNOSIS — E1122 Type 2 diabetes mellitus with diabetic chronic kidney disease: Secondary | ICD-10-CM | POA: Diagnosis not present

## 2017-01-15 DIAGNOSIS — E1165 Type 2 diabetes mellitus with hyperglycemia: Secondary | ICD-10-CM

## 2017-01-15 DIAGNOSIS — N184 Chronic kidney disease, stage 4 (severe): Secondary | ICD-10-CM | POA: Diagnosis not present

## 2017-01-15 DIAGNOSIS — R002 Palpitations: Secondary | ICD-10-CM | POA: Diagnosis not present

## 2017-01-15 DIAGNOSIS — E1321 Other specified diabetes mellitus with diabetic nephropathy: Secondary | ICD-10-CM | POA: Diagnosis not present

## 2017-01-15 DIAGNOSIS — I129 Hypertensive chronic kidney disease with stage 1 through stage 4 chronic kidney disease, or unspecified chronic kidney disease: Secondary | ICD-10-CM | POA: Diagnosis not present

## 2017-01-15 MED ORDER — CARVEDILOL 6.25 MG PO TABS: 6.2500 mg | ORAL_TABLET | Freq: Two times a day (BID) | ORAL | 3 refills | Status: DC

## 2017-01-15 NOTE — Patient Instructions (Addendum)
Medication Instructions:  HOLD Vasartan UNTIL APPOINTMENT WITH NEPHROLOGIST  INCREASE Coreg to 6.25mg  Take 1 tablet twice a day  Labwork: None   Testing/Procedures: Your physician has requested that you have an echocardiogram. Echocardiography is a painless test that uses sound waves to create images of your heart. It provides your doctor with information about the size and shape of your heart and how well your heart's chambers and valves are working. This procedure takes approximately one hour. There are no restrictions for this procedure.  Follow-Up: Your physician recommends that you schedule a follow-up appointment in: 4 weeks with DR Claiborne Billings  Any Other Special Instructions Will Be Listed Below (If Applicable).  You have been referred to SEE A NEPHROLOGIST AND ENDOCRINOLOGIST-PER DR Claiborne Billings PATIENT NEEDS TO BE SEEN WITH IN THE NEXT FEW DAYS OR WEEK AT THE LATEST.   If you need a refill on your cardiac medications before your next appointment, please call your pharmacy.
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
## 2017-01-18 ENCOUNTER — Telehealth: Payer: Self-pay | Admitting: Radiology

## 2017-01-18 NOTE — Telephone Encounter (Signed)
-----   Message from [facility], MD sent at 01/14/2017 11:07 AM EST ----- Abnormal renal function tests. Worse than 04/29/13. Needs Nephrology evaluation this week or ASAP. Pt has a Nephrologist he currently uses. Call and inform. ( I left him a phone message also).

## 2017-01-18 NOTE — Telephone Encounter (Signed)
I have called patient to advise him of lab results and see if he has a nephrologist. He states he has already discussed with the doctor and has decided to see a new nephrologist.

## 2017-01-21 ENCOUNTER — Telehealth: Payer: Self-pay | Admitting: Cardiovascular Disease

## 2017-01-21 NOTE — Telephone Encounter (Signed)
The patient is scheduled to see Dr. Pearson Grippe on 01-24-17 at 10:45 a.m.

## 2017-01-24 DIAGNOSIS — D649 Anemia, unspecified: Secondary | ICD-10-CM | POA: Diagnosis not present

## 2017-01-24 DIAGNOSIS — N184 Chronic kidney disease, stage 4 (severe): Secondary | ICD-10-CM | POA: Diagnosis not present

## 2017-01-28 ENCOUNTER — Ambulatory Visit (HOSPITAL_COMMUNITY): Payer: 59 | Attending: Cardiovascular Disease

## 2017-01-28 ENCOUNTER — Other Ambulatory Visit: Payer: Self-pay

## 2017-01-28 DIAGNOSIS — R002 Palpitations: Secondary | ICD-10-CM | POA: Diagnosis not present

## 2017-01-28 DIAGNOSIS — N189 Chronic kidney disease, unspecified: Secondary | ICD-10-CM | POA: Insufficient documentation

## 2017-01-28 DIAGNOSIS — I1310 Hypertensive heart and chronic kidney disease without heart failure, with stage 1 through stage 4 chronic kidney disease, or unspecified chronic kidney disease: Secondary | ICD-10-CM | POA: Insufficient documentation

## 2017-01-28 DIAGNOSIS — Z713 Dietary counseling and surveillance: Secondary | ICD-10-CM | POA: Diagnosis not present

## 2017-01-28 NOTE — Discharge Instructions (Signed)

## 2017-01-29 ENCOUNTER — Encounter (HOSPITAL_COMMUNITY): Payer: Self-pay | Admitting: *Deleted

## 2017-01-29 ENCOUNTER — Encounter (HOSPITAL_COMMUNITY)
Admission: RE | Admit: 2017-01-29 | Discharge: 2017-01-29 | Disposition: A | Discharge status: Home / Self Care | Payer: 59 | Source: Ambulatory Visit | Attending: Nephrology | Admitting: Nephrology

## 2017-01-29 ENCOUNTER — Observation Stay (HOSPITAL_COMMUNITY)
Admission: EM | Admit: 2017-01-29 | Discharge: 2017-01-30 | Disposition: A | Discharge status: Home / Self Care | Payer: 59 | Attending: Internal Medicine | Admitting: Internal Medicine

## 2017-01-29 ENCOUNTER — Other Ambulatory Visit: Payer: Self-pay

## 2017-01-29 ENCOUNTER — Telehealth: Payer: Self-pay | Admitting: Cardiovascular Disease

## 2017-01-29 DIAGNOSIS — I129 Hypertensive chronic kidney disease with stage 1 through stage 4 chronic kidney disease, or unspecified chronic kidney disease: Secondary | ICD-10-CM | POA: Diagnosis not present

## 2017-01-29 DIAGNOSIS — E11649 Type 2 diabetes mellitus with hypoglycemia without coma: Secondary | ICD-10-CM | POA: Diagnosis not present

## 2017-01-29 DIAGNOSIS — E119 Type 2 diabetes mellitus without complications: Secondary | ICD-10-CM

## 2017-01-29 DIAGNOSIS — Z5181 Encounter for therapeutic drug level monitoring: Secondary | ICD-10-CM | POA: Diagnosis not present

## 2017-01-29 DIAGNOSIS — Z794 Long term (current) use of insulin: Secondary | ICD-10-CM | POA: Insufficient documentation

## 2017-01-29 DIAGNOSIS — D631 Anemia in chronic kidney disease: Secondary | ICD-10-CM | POA: Diagnosis not present

## 2017-01-29 DIAGNOSIS — E1122 Type 2 diabetes mellitus with diabetic chronic kidney disease: Secondary | ICD-10-CM | POA: Diagnosis not present

## 2017-01-29 DIAGNOSIS — Z79899 Other long term (current) drug therapy: Secondary | ICD-10-CM | POA: Insufficient documentation

## 2017-01-29 DIAGNOSIS — D649 Anemia, unspecified: Secondary | ICD-10-CM | POA: Diagnosis present

## 2017-01-29 DIAGNOSIS — N179 Acute kidney failure, unspecified: Secondary | ICD-10-CM | POA: Diagnosis not present

## 2017-01-29 DIAGNOSIS — Z9889 Other specified postprocedural states: Secondary | ICD-10-CM

## 2017-01-29 DIAGNOSIS — R799 Abnormal finding of blood chemistry, unspecified: Secondary | ICD-10-CM | POA: Diagnosis present

## 2017-01-29 DIAGNOSIS — Z7982 Long term (current) use of aspirin: Secondary | ICD-10-CM | POA: Diagnosis not present

## 2017-01-29 DIAGNOSIS — Z833 Family history of diabetes mellitus: Secondary | ICD-10-CM

## 2017-01-29 DIAGNOSIS — E1165 Type 2 diabetes mellitus with hyperglycemia: Secondary | ICD-10-CM | POA: Insufficient documentation

## 2017-01-29 DIAGNOSIS — Z23 Encounter for immunization: Secondary | ICD-10-CM | POA: Diagnosis not present

## 2017-01-29 DIAGNOSIS — N184 Chronic kidney disease, stage 4 (severe): Secondary | ICD-10-CM | POA: Insufficient documentation

## 2017-01-29 HISTORY — DX: Disorder of kidney and ureter, unspecified: N28.9

## 2017-01-29 LAB — URINALYSIS, ROUTINE W REFLEX MICROSCOPIC
Bacteria, UA: NONE SEEN
Bilirubin Urine: NEGATIVE
Glucose, UA: 150 mg/dL — AB
Hgb urine dipstick: NEGATIVE
Ketones, ur: NEGATIVE mg/dL
Leukocytes, UA: NEGATIVE
Nitrite: NEGATIVE
Protein, ur: 100 mg/dL — AB
Specific Gravity, Urine: 1.01 (ref 1.005–1.030)
Squamous Epithelial / HPF: NONE SEEN
pH: 5 (ref 5.0–8.0)

## 2017-01-29 LAB — COMPREHENSIVE METABOLIC PANEL
ALT: 17 U/L (ref 17–63)
AST: 24 U/L (ref 15–41)
Albumin: 3.4 g/dL — ABNORMAL LOW (ref 3.5–5.0)
Alkaline Phosphatase: 68 U/L (ref 38–126)
Anion gap: 12 (ref 5–15)
BUN: 86 mg/dL — ABNORMAL HIGH (ref 6–20)
CO2: 25 mmol/L (ref 22–32)
Calcium: 8.3 mg/dL — ABNORMAL LOW (ref 8.9–10.3)
Chloride: 101 mmol/L (ref 101–111)
Creatinine, Ser: 4.5 mg/dL — ABNORMAL HIGH (ref 0.61–1.24)
GFR calc Af Amer: 16 mL/min — ABNORMAL LOW (ref >60)
GFR calc non Af Amer: 14 mL/min — ABNORMAL LOW (ref >60)
Glucose, Bld: 51 mg/dL — ABNORMAL LOW (ref 65–99)
Potassium: 3.9 mmol/L (ref 3.5–5.1)
Sodium: 138 mmol/L (ref 135–145)
Total Bilirubin: 0.3 mg/dL (ref 0.3–1.2)
Total Protein: 6.9 g/dL (ref 6.5–8.1)

## 2017-01-29 LAB — CBC
HCT: 21.7 % — ABNORMAL LOW (ref 39.0–52.0)
Hemoglobin: 7.1 g/dL — ABNORMAL LOW (ref 13.0–17.0)
MCH: 28.5 pg (ref 26.0–34.0)
MCHC: 32.7 g/dL (ref 30.0–36.0)
MCV: 87.1 fL (ref 78.0–100.0)
Platelets: 241 10*3/uL (ref 150–400)
RBC: 2.49 MIL/uL — ABNORMAL LOW (ref 4.22–5.81)
RDW: 12.9 % (ref 11.5–15.5)
WBC: 9.6 10*3/uL (ref 4.0–10.5)

## 2017-01-29 LAB — POCT HEMOGLOBIN-HEMACUE: Hemoglobin: 6.9 g/dL — CL (ref 13.0–17.0)

## 2017-01-29 LAB — PREPARE RBC (CROSSMATCH)

## 2017-01-29 LAB — CBG MONITORING, ED
Glucose-Capillary: 41 mg/dL — CL (ref 65–99)
Glucose-Capillary: 121 mg/dL — ABNORMAL HIGH (ref 65–99)

## 2017-01-29 LAB — POC OCCULT BLOOD, ED: Fecal Occult Bld: NEGATIVE

## 2017-01-29 LAB — GLUCOSE, CAPILLARY: Glucose-Capillary: 269 mg/dL — ABNORMAL HIGH (ref 65–99)

## 2017-01-29 MED ORDER — INSULIN ASPART 100 UNIT/ML ~~LOC~~ SOLN: 0.0000 [IU] | Freq: Three times a day (TID) | SUBCUTANEOUS | Status: DC

## 2017-01-29 MED ORDER — AMLODIPINE BESYLATE 5 MG PO TABS
10.0000 mg | ORAL_TABLET | Freq: Every day | ORAL | Status: DC
  Administered 2017-01-30: 10 mg via ORAL
  Filled 2017-01-29: qty 2

## 2017-01-29 MED ORDER — PNEUMOCOCCAL VAC POLYVALENT 25 MCG/0.5ML IJ INJ
0.5000 mL | INJECTION | INTRAMUSCULAR | Status: AC
  Administered 2017-01-30: 0.5 mL via INTRAMUSCULAR
  Filled 2017-01-29: qty 0.5

## 2017-01-29 MED ORDER — DARBEPOETIN ALFA 150 MCG/0.3ML IJ SOSY
120.0000 ug | PREFILLED_SYRINGE | INTRAMUSCULAR | Status: DC
  Administered 2017-01-29: 120 ug via SUBCUTANEOUS

## 2017-01-29 MED ORDER — INSULIN GLARGINE 100 UNIT/ML ~~LOC~~ SOLN
30.0000 [IU] | Freq: Every day | SUBCUTANEOUS | Status: DC
  Administered 2017-01-29: 30 [IU] via SUBCUTANEOUS
  Filled 2017-01-29: qty 0.3

## 2017-01-29 MED ORDER — ASPIRIN EC 81 MG PO TBEC
81.0000 mg | DELAYED_RELEASE_TABLET | Freq: Every day | ORAL | Status: DC
  Administered 2017-01-30: 81 mg via ORAL
  Filled 2017-01-29: qty 1

## 2017-01-29 MED ORDER — DARBEPOETIN ALFA 150 MCG/0.3ML IJ SOSY
PREFILLED_SYRINGE | INTRAMUSCULAR | Status: AC
  Filled 2017-01-29: qty 0.3

## 2017-01-29 MED ORDER — INSULIN ASPART 100 UNIT/ML ~~LOC~~ SOLN
5.0000 [IU] | Freq: Three times a day (TID) | SUBCUTANEOUS | Status: DC
  Administered 2017-01-30: 5 [IU] via SUBCUTANEOUS

## 2017-01-29 MED ORDER — FUROSEMIDE 40 MG PO TABS: 40.0000 mg | ORAL_TABLET | Freq: Every day | ORAL | Status: DC

## 2017-01-29 MED ORDER — FUROSEMIDE 80 MG PO TABS
80.0000 mg | ORAL_TABLET | Freq: Every morning | ORAL | Status: DC
  Administered 2017-01-30: 80 mg via ORAL
  Filled 2017-01-29: qty 1

## 2017-01-29 MED ORDER — INSULIN ASPART 100 UNIT/ML ~~LOC~~ SOLN
0.0000 [IU] | Freq: Every day | SUBCUTANEOUS | Status: DC
  Administered 2017-01-29: 3 [IU] via SUBCUTANEOUS

## 2017-01-29 MED ORDER — CARVEDILOL 6.25 MG PO TABS
6.2500 mg | ORAL_TABLET | Freq: Two times a day (BID) | ORAL | Status: DC
  Administered 2017-01-29 – 2017-01-30 (×2): 6.25 mg via ORAL
  Filled 2017-01-29 (×2): qty 1

## 2017-01-29 MED ORDER — SODIUM CHLORIDE 0.9 % IV SOLN: 10.0000 mL/h | Freq: Once | INTRAVENOUS | Status: DC

## 2017-01-29 NOTE — H&P (Signed)
Date: 01/29/2017               Patient Name:  [name] [name] MRN: [id number]  DOB: [birth year] Age / Sex: 51 y.o., male   PCP: No Pcp Per Patient         Medical Service: Internal Medicine Teaching Service         Attending Physician: Dr. Oval Linsey, MD    First Contact: Dr. Ophelia Shoulder Pager: [phone]  Second Contact: Dr. Zada Finders Pager: [phone]       After Hours (After 5p/  First Contact Pager: [phone]  weekends / holidays): Second Contact Pager: [phone]   Chief Complaint: Acute on CKD and symptomatic anemia  History of Present Illness: [name] is a 51 y.o. male with a h/o of DM, HTN, and CKD who presents with fatigue, LE edema, decreased UOP and foaming.  Pt was initially evaluated by cardiology for "irregular heart beat" and noted to have significant worsening of SCr from his baseline CKD. Over the last 5 years her has had progressive decline in renal function from normal. He was referred to nephrology on 01/16/16 for further evaluation. There has been some limited w/u of his worsening disease, but it appears that he was thought to have nephrotic syndrome 2/2 uncontrolled HTN and DM. After routine monitoring labs demonstrated significant anemia on Monday the pt was referred [facility] for Aranesp where his Hgb was noted to have dropped further to 7.1. Pt complains of worsening symptoms of fatigue and generalized weakness x 2-3 wks. He denies CP, SOB, fevers, abd pain, n/v/d. Pt denies any dark stool or BRBPR. He does complain of deep chills.  Pt reports good compliance w/ his DM and HTN medications. In questioning about his h/o DM, he confirmed a long hx for 30 yrs, however, he became defensive when it was noted that his DM was uncontrolled.  In the ED, pt was HDS. Hgb 7.1 (from 10.6 on 01/14/16), received 2U pRBCs. SCr 4.5 (from 3.65 on 01/14/16). FOBT negative.  Meds: Prescriptions Prior to Admission  Medication Sig Dispense Refill Last Dose  .  amLODipine (NORVASC) 5 MG tablet Take 10 mg by mouth 2 (two) times daily.    01/29/2017 at Unknown time  . aspirin 81 MG tablet Take 81 mg by mouth every morning.    01/29/2017 at Unknown time  . carvedilol (COREG) 6.25 MG tablet Take 1 tablet (6.25 mg total) by mouth 2 (two) times daily. 60 tablet 3 01/29/2017 at Unknown time  . furosemide (LASIX) 40 MG tablet Take 40 mg by mouth.   01/29/2017 at Unknown time  . insulin glargine (LANTUS) 100 UNIT/ML injection Inject 40 Units into the skin at bedtime.   01/28/2017 at Unknown time  . insulin regular (NOVOLIN R,HUMULIN R) 100 units/mL injection Inject 11 Units into the skin 3 (three) times daily before meals. Sliding scale up to 15 addt'l units as needed for CBG levels   01/29/2017 at Unknown time   Allergies: Allergies as of 01/29/2017 - Review Complete 01/29/2017  Allergen Reaction Noted  . Penicillins Rash 03/11/2012   Past Medical History:  Diagnosis Date  . Bacteremia   . Diabetes mellitus   . Hypertension   . Pneumonia 02/2012   Strep pneumoniae bilateral pneumonia complicated by bacteremia  . Renal disorder    End stage, not on dialysis    Family History: family history includes Diabetes in his maternal grandmother. No family h/o renal disease.  Social  History: Social History  Substance Use Topics  . Smoking status: Never Smoker  . Smokeless tobacco: Never Used  . Alcohol use Yes     Comment: rare    Review of Systems: A complete ROS was negative except as per HPI. Review of Systems  Constitutional: Positive for chills and malaise/fatigue. Negative for diaphoresis, fever and weight loss.  Eyes: Negative for blurred vision.  Respiratory: Positive for shortness of breath. Negative for cough and hemoptysis.   Cardiovascular: Positive for palpitations. Negative for chest pain and leg swelling.  Gastrointestinal: Negative for abdominal pain, blood in stool, constipation, diarrhea, melena, nausea and vomiting.  Genitourinary: Negative for  dysuria, frequency and urgency.  Musculoskeletal: Negative for myalgias.  Skin: Negative for rash.  Neurological: Positive for weakness. Negative for dizziness, tremors, focal weakness, seizures, loss of consciousness and headaches.  Endo/Heme/Allergies: Negative for polydipsia.  Psychiatric/Behavioral: The patient is not nervous/anxious.     Physical Exam: Vitals:   01/29/17 1830 01/29/17 1846 01/29/17 1854 01/29/17 1916  BP: 141/83 141/83 141/83 139/78  Pulse: 86 83 85 84  Resp: 16 18 18 16   Temp:  97.9 F (36.6 C) 98 F (36.7 C) 98.2 F (36.8 C)  TempSrc:  Oral Oral Oral  SpO2: 97%  99% 98%  Weight:      Height:       Physical Exam  Constitutional: He is oriented to person, place, and time. He appears well-developed and well-nourished. He is cooperative. No distress.  HENT:  Head: Normocephalic and atraumatic.  Right Ear: Hearing normal.  Left Ear: Hearing normal.  Nose: Nose normal.  Mouth/Throat: Mucous membranes are normal.  Eyes:  Palpebral pallor  Cardiovascular: Normal rate, regular rhythm, S1 normal, S2 normal and intact distal pulses.  Exam reveals no gallop.   No murmur heard. Pulmonary/Chest: Effort normal and breath sounds normal. No respiratory distress. He has no wheezes. He has no rhonchi. He has no rales. He exhibits no tenderness.  Abdominal: Soft. Normal appearance and bowel sounds are normal. He exhibits no ascites. There is no hepatosplenomegaly. There is no tenderness.  Musculoskeletal: He exhibits no edema or tenderness.  Multiple abrasions of BLE, no erythema or drainage, no significant TTP.  Neurological: He is alert and oriented to person, place, and time. He has normal strength.  Skin: Skin is warm, dry and intact. He is not diaphoretic.  Psychiatric: He has a normal mood and affect. His speech is normal and behavior is normal.    Labs: CBC:  Recent Labs Lab 01/29/17 1208 01/29/17 1415  WBC  --  9.6  HGB 6.9* 7.1*  HCT  --  21.7*  MCV   --  87.1  PLT  --  829   Basic Metabolic Panel:  Recent Labs Lab 01/29/17 1415  NA 138  K 3.9  CL 101  CO2 25  GLUCOSE 51*  BUN 86*  CREATININE 4.50*  CALCIUM 8.3*   Liver Function Tests:  Recent Labs Lab 01/29/17 1415  AST 24  ALT 17  ALKPHOS 68  BILITOT 0.3  PROT 6.9  ALBUMIN 3.4*   CBG: Lab Results  Component Value Date   HGBA1C 13.1 01/13/2017    Recent Labs Lab 01/29/17 1410 01/29/17 1507  GLUCAP 41* 121*  Urinalysis    Component Value Date/Time   BILIRUBINUR negative 01/13/2017 1235   KETONESUR negative 01/13/2017 1235   PROTEINUR >=300 (A) 01/13/2017 1235   UROBILINOGEN 0.2 01/13/2017 1235   NITRITE Negative 01/13/2017 1235   LEUKOCYTESUR Negative  01/13/2017 1235   EKG Interpretation  Date/Time:  Wednesday January 29 2017 16:42:36 EST Ventricular Rate:  82 PR Interval:    QRS Duration: 129 QT Interval:  410 QTC Calculation: 479 R Axis:   -56 Text Interpretation:  Sinus rhythm Borderline prolonged PR interval Nonspecific IVCD with LAD Left ventricular hypertrophy Anterior Q waves, possibly due to LVH Abnrm T, consider ischemia, anterolateral lds ST elevation, consider inferior injury No STEMI.  Confirmed by LONG MD, JOSHUA [phone]) on 01/29/2017 5:20:44 PM  Imaging: No results found. Assessment & Plan by Problem: Active Problems:   AKI (acute kidney injury) [facility])  Mr. KAVIN WECKWERTH is a 51 y.o. male with HTN, DM, and CKD who presents w/ symptomatic anemia and AKI.  1) AKI on CKD: Pt w/ declining renal function presents w/ significant rise in SCr accompanied by precipitous drop in Hgb w/o signs of active bleeding. Unclear if anemia caused renal hypoperfusion or AKI may have precipitated acute worsening of anemia in a pt w/ poor baseline epo. Known nephrotic range proteinuria though 2/2 uncontrolled HTN and DM. Appreciate nephorology's assistance, will likely need continued w/u and management as an outpt. ANA positive, SPEP neg. No Bx. -  admit to med-surg - nephrology c/s - UPC - hold valsartan - consider switching to non-dihydropyridine CCB for proteinuria - control BP and DM as below   2) Symptomatic anemia of CKD: Hgb initially 7.1 from 10.6, now s/p 2U pRBCs. No evidence of acute BL w/ negative FOBT or significant concern for hemolysis, but will r/o w/ labs. Transfuse to goal of > 8.0. Unclear why chronic anemia has worsened acutely. - LDH, hapto, retic - Fe studies - post-transfusion H&H, CBC in AM  3) Uncontrolled DM: A1c 13.1 on 01/13/17. BG initially 41 on presentation then resolved to 121. Home meds: Lantus 40U qHS + Regular 11U qAC + SSI. - Lantus 30U + Novolog 6U - SSI-r  4) HTN: BP 140s/80s on presentation. Home meds: amlodipine 10mg  qD, carvedilol 3.25mg  BID, valsartan 320mg  qD, furosemide 80mg  qAM + 40mg  qPM. Recently saw Dr. Claiborne Billings w/ cardiology who recommended holding valsartan in setting of SCr rise, increased Coreg to 6.25 in the setting of palpitaions. - Continue amlodipine 10, coreg 6.25 - Hold Lasix and valsartan  DVT PPx - low molecular weight heparin  Code Status - Full  Consults Placed - Nephrology  Dispo: Admit patient to Observation with expected length of stay less than 2 midnights.  Signed: Holley Raring, MD 01/29/2017, 8:02 PM  Pager: [phone]

## 2017-01-29 NOTE — ED Notes (Signed)
Blood consent transported with pt

## 2017-01-29 NOTE — ED Notes (Addendum)
Blood administration started at 1707, pre checklist completed 1706

## 2017-01-29 NOTE — ED Notes (Signed)
ED Provider at bedside. 

## 2017-01-29 NOTE — Telephone Encounter (Signed)
Records received from [facility] for apt on 02/13/17 with Dr Claiborne Billings, records placed in Dr Evette Georges schedule for 02/22/18CN

## 2017-01-29 NOTE — ED Notes (Signed)
Pt remains ao x 4.  Given coke, juice and graham crackers.

## 2017-01-29 NOTE — ED Provider Notes (Signed)
Emergency Department Provider Note   I have reviewed the triage vital signs and the nursing notes.   HISTORY  Chief Complaint Abnormal Lab (low hemaglobin)   HPI [name] [name] is a 51 y.o. male with PMH of DM, HTN, and CKD presents to the emergency department for evaluation of significant fatigue, lower extremity edema, foaming urination, decreased urine output, and low hemoglobin on outpatient labs. The patient was initially seen by a cardiologist after an irregular heartbeat. His creatinine was elevated he was referred to a nephrologist. Labs are drawn last week and the patient was called on Monday saying that his hemoglobin was severely low. He came to the office today for Aranesp shot but with significant symptoms he then presented to the emergency department. He denies any chest pain or dyspnea. No fevers or chills. He does note some lower back discomfort but it should be did this to the way he was sleeping. No radiation. Back pain feels like a soreness in the bilateral. No black or BRBPR.    Past Medical History:  Diagnosis Date  . Bacteremia   . Diabetes mellitus   . Hypertension   . Pneumonia 02/2012   Strep pneumoniae bilateral pneumonia complicated by bacteremia  . Renal disorder    End stage, not on dialysis    Patient Active Problem List   Diagnosis Date Noted  . AKI (acute kidney injury) ([place]) 01/29/2017  . Palpitations 01/13/2017  . Essential hypertension, benign 10/26/2013  . Dyspnea 06/09/2012  . Dizziness 06/09/2012  . Pneumonia, organism unspecified([place]) 04/07/2012  . Pleural effusion 04/07/2012  . Parapneumonic effusion 03/28/2012  . Anemia 03/28/2012  . Hypoalbuminemia 03/28/2012  . Total bilirubin, elevated 03/28/2012  . Bilateral leg edema 03/23/2012  . Cyst of skin 03/23/2012  . Bacteremia due to Streptococcus pneumoniae 03/15/2012  . Hyponatremia 03/15/2012  . Acute kidney failure 03/13/2012  . Elevated LFTs 03/13/2012  . Pneumococcal  pneumonia ([place]) 03/12/2012  . Diabetes mellitus ([place]) 03/12/2012    Past Surgical History:  Procedure Laterality Date  . CHEST TUBE INSERTION     placed during hospitalization 02/2012  . COLONOSCOPY  04/01/2012   Procedure: COLONOSCOPY;  Surgeon: Juanita Craver, MD;  Location: [facility] ENDOSCOPY;  Service: Endoscopy;  Laterality: N/A;  . ESOPHAGOGASTRODUODENOSCOPY  03/31/2012   Procedure: ESOPHAGOGASTRODUODENOSCOPY (EGD);  Surgeon: Beryle Beams, MD;  Location: [facility] ENDOSCOPY;  Service: Endoscopy;  Laterality: N/A;    Current Outpatient Rx  . Order #: [id number] Class: Historical Med  . Order #: [id number] Class: Historical Med  . Order #: [id number] Class: Normal  . Order #: [id number] Class: Historical Med  . Order #: [id number] Class: Historical Med  . Order #: [id number] Class: Historical Med    Allergies Penicillins  Family History  Problem Relation Age of Onset  . Diabetes Maternal Grandmother     Social History Social History  Substance Use Topics  . Smoking status: Never Smoker  . Smokeless tobacco: Never Used  . Alcohol use Yes     Comment: rare     Review of Systems  Constitutional: No fever/chills. Positive generalized weakness.  Eyes: No visual changes. ENT: No sore throat. Cardiovascular: Denies chest pain. Respiratory: Denies shortness of breath. Gastrointestinal: No abdominal pain.  No nausea, no vomiting.  No diarrhea.  No constipation. Genitourinary: Negative for dysuria. Decreased urine output.  Musculoskeletal: Negative for back pain. Skin: Negative for rash. Neurological: Negative for headaches, focal weakness or numbness.  10-point ROS otherwise negative.  ____________________________________________   PHYSICAL EXAM:  VITAL SIGNS: ED  Triage Vitals  Enc Vitals Group     BP 01/29/17 1355 137/82     Pulse Rate 01/29/17 1355 73     Resp 01/29/17 1355 16     Temp 01/29/17 1355 97.7 F (36.5 C)     Temp Source 01/29/17 1355 Oral     SpO2 01/29/17 1355 100 %      Weight 01/29/17 1356 204 lb (92.5 kg)     Height 01/29/17 1356 6\' 2"  (1.88 m)     Pain Score 01/29/17 1357 0   Constitutional: Alert and oriented. Well appearing and in no acute distress. Eyes: Conjunctivae are normal.  Head: Atraumatic. Nose: No congestion/rhinnorhea. Mouth/Throat: Mucous membranes are moist.  Oropharynx non-erythematous. Neck: No stridor.   Cardiovascular: Normal rate, regular rhythm. Good peripheral circulation. Grossly normal heart sounds.   Respiratory: Normal respiratory effort.  No retractions. Lungs CTAB. Gastrointestinal: Soft and nontender. No distention. Brown stool on rectal exam. No gross blood or melena.  Musculoskeletal: No lower extremity tenderness nor edema. No gross deformities of extremities. Neurologic:  Normal speech and language. No gross focal neurologic deficits are appreciated.  Skin:  Skin is warm, dry and intact. No rash noted. Psychiatric: Mood and affect are normal. Speech and behavior are normal.  ____________________________________________   LABS (all labs ordered are listed, but only abnormal results are displayed)  Labs Reviewed  COMPREHENSIVE METABOLIC PANEL - Abnormal; Notable for the following:       Result Value   Glucose, Bld 51 (*)    BUN 86 (*)    Creatinine, Ser 4.50 (*)    Calcium 8.3 (*)    Albumin 3.4 (*)    GFR calc non Af Amer 14 (*)    GFR calc Af Amer 16 (*)    All other components within normal limits  CBC - Abnormal; Notable for the following:    RBC 2.49 (*)    Hemoglobin 7.1 (*)    HCT 21.7 (*)    All other components within normal limits  CBG MONITORING, ED - Abnormal; Notable for the following:    Glucose-Capillary 41 (*)    All other components within normal limits  CBG MONITORING, ED - Abnormal; Notable for the following:    Glucose-Capillary 121 (*)    All other components within normal limits  URINALYSIS, ROUTINE W REFLEX MICROSCOPIC  BASIC METABOLIC PANEL  CBC  POC OCCULT BLOOD, ED  POC  OCCULT BLOOD, ED  TYPE AND SCREEN  PREPARE RBC (CROSSMATCH)   ____________________________________________  EKG   EKG Interpretation  Date/Time:  Wednesday January 29 2017 16:42:36 EST Ventricular Rate:  82 PR Interval:    QRS Duration: 129 QT Interval:  410 QTC Calculation: 479 R Axis:   -56 Text Interpretation:  Sinus rhythm Borderline prolonged PR interval Nonspecific IVCD with LAD Left ventricular hypertrophy Anterior Q waves, possibly due to LVH Abnrm T, consider ischemia, anterolateral lds ST elevation, consider inferior injury No STEMI.  Confirmed by LONG MD, JOSHUA [phone]) on 01/29/2017 5:20:44 PM       ____________________________________________  RADIOLOGY  None ____________________________________________   PROCEDURES  Procedure(s) performed:   Procedures  None ____________________________________________   INITIAL IMPRESSION / ASSESSMENT AND PLAN / ED COURSE  Pertinent labs & imaging results that were available during my care of the patient were reviewed by me and considered in my medical decision making (see chart for details).  Patient resents to the emergency department for evaluation of generalized weakness, worsening kidney function, decreased hemoglobin. He  had an episode several years in the past required blood transfusion with no clear source identified. Patient's renal function has been worsening over the last several weeks along with precipitous drop in Hb. No gross blood or melena. Consented the patient to receive blood transfusion. Additional labs pending. Patient will require admission for blood transfusion and evaluation of acute on chronic renal failure. Normal electrolytes.   Discussed patient's case with Dr. Eppie Gibson. He will place orders. Patient and family (if present) updated with plan. Care transferred to medicine service.  I reviewed all nursing notes, vitals, pertinent old records, EKGs, labs, imaging (as  available).  ____________________________________________  FINAL CLINICAL IMPRESSION(S) / ED DIAGNOSES  Final diagnoses:  Symptomatic anemia  Acute renal failure, unspecified acute renal failure type ([place])     MEDICATIONS GIVEN DURING THIS VISIT:  Medications  0.9 %  sodium chloride infusion (not administered)  carvedilol (COREG) tablet 6.25 mg (not administered)  insulin glargine (LANTUS) injection 30 Units (not administered)  amLODipine (NORVASC) tablet 10 mg (not administered)  insulin aspart (novoLOG) injection 0-9 Units (not administered)  insulin aspart (novoLOG) injection 0-5 Units (not administered)     NEW OUTPATIENT MEDICATIONS STARTED DURING THIS VISIT:  None   Note:  This document was prepared using Dragon voice recognition software and may include unintentional dictation errors.  Nanda Quinton, MD Emergency Medicine   Margette Fast, MD 01/29/17 [id number]

## 2017-01-29 NOTE — Progress Notes (Signed)
Pt came in today for scheduled aranesp shot.  This is his first appointment.  He was given handout from [facility] about aranesp.  PT verbalized understanding of instructions

## 2017-01-29 NOTE — ED Notes (Signed)
Pt had 1 episode of vomiting after dinner. Pt denies nausea at this time

## 2017-01-29 NOTE — ED Triage Notes (Signed)
Pt states went to Short Stay to have "shot for low hemoglobin" and was told that his hgb had dropped to a 6 (last noted in chart was 10.6 on 1/22).  Pt has end stage renal disease.  Pt states recent chills and weakness.  Appears pale.

## 2017-01-30 DIAGNOSIS — Z880 Allergy status to penicillin: Secondary | ICD-10-CM

## 2017-01-30 DIAGNOSIS — D649 Anemia, unspecified: Secondary | ICD-10-CM

## 2017-01-30 DIAGNOSIS — E1122 Type 2 diabetes mellitus with diabetic chronic kidney disease: Secondary | ICD-10-CM | POA: Diagnosis not present

## 2017-01-30 DIAGNOSIS — N179 Acute kidney failure, unspecified: Secondary | ICD-10-CM

## 2017-01-30 DIAGNOSIS — N184 Chronic kidney disease, stage 4 (severe): Secondary | ICD-10-CM

## 2017-01-30 DIAGNOSIS — Z794 Long term (current) use of insulin: Secondary | ICD-10-CM

## 2017-01-30 LAB — RETICULOCYTES
RBC.: 2.97 MIL/uL — ABNORMAL LOW (ref 4.22–5.81)
Retic Count, Absolute: 86.1 10*3/uL (ref 19.0–186.0)
Retic Ct Pct: 2.9 % (ref 0.4–3.1)

## 2017-01-30 LAB — BASIC METABOLIC PANEL
Anion gap: 12 (ref 5–15)
BUN: 77 mg/dL — ABNORMAL HIGH (ref 6–20)
CO2: 26 mmol/L (ref 22–32)
Calcium: 7.9 mg/dL — ABNORMAL LOW (ref 8.9–10.3)
Chloride: 100 mmol/L — ABNORMAL LOW (ref 101–111)
Creatinine, Ser: 4.02 mg/dL — ABNORMAL HIGH (ref 0.61–1.24)
GFR calc Af Amer: 19 mL/min — ABNORMAL LOW (ref >60)
GFR calc non Af Amer: 16 mL/min — ABNORMAL LOW (ref >60)
Glucose, Bld: 139 mg/dL — ABNORMAL HIGH (ref 65–99)
Potassium: 3.5 mmol/L (ref 3.5–5.1)
Sodium: 138 mmol/L (ref 135–145)

## 2017-01-30 LAB — HEMOGLOBIN AND HEMATOCRIT, BLOOD
HCT: 25.2 % — ABNORMAL LOW (ref 39.0–52.0)
Hemoglobin: 8.4 g/dL — ABNORMAL LOW (ref 13.0–17.0)

## 2017-01-30 LAB — TYPE AND SCREEN
ABO/RH(D): O POS
Antibody Screen: NEGATIVE
Unit division: 0
Unit division: 0

## 2017-01-30 LAB — CBC
HCT: 26.1 % — ABNORMAL LOW (ref 39.0–52.0)
Hemoglobin: 8.8 g/dL — ABNORMAL LOW (ref 13.0–17.0)
MCH: 28.4 pg (ref 26.0–34.0)
MCHC: 33.7 g/dL (ref 30.0–36.0)
MCV: 84.2 fL (ref 78.0–100.0)
Platelets: 205 10*3/uL (ref 150–400)
RBC: 3.1 MIL/uL — ABNORMAL LOW (ref 4.22–5.81)
RDW: 14.7 % (ref 11.5–15.5)
WBC: 8.5 10*3/uL (ref 4.0–10.5)

## 2017-01-30 LAB — FERRITIN: Ferritin: 730 ng/mL — ABNORMAL HIGH (ref 24–336)

## 2017-01-30 LAB — LACTATE DEHYDROGENASE: LDH: 194 U/L — ABNORMAL HIGH (ref 98–192)

## 2017-01-30 LAB — GLUCOSE, CAPILLARY: Glucose-Capillary: 83 mg/dL (ref 65–99)

## 2017-01-30 LAB — PROTEIN / CREATININE RATIO, URINE
Creatinine, Urine: 70.46 mg/dL
Protein Creatinine Ratio: 2.13 mg/mg{Cre} — ABNORMAL HIGH (ref 0.00–0.15)
Total Protein, Urine: 150 mg/dL

## 2017-01-30 NOTE — Progress Notes (Signed)
Subjective: No acute events overnight. Patient no longer feeling fatigued. Overall he states he feels better and is back to his normal status. He was sitting up in a chair and was ready to go home. He had several questions regarding his medical conditions which we answered thoroughly.   Objective:  Vital signs in last 24 hours: Vitals:   01/29/17 2134 01/29/17 2240 01/30/17 0500 01/30/17 0904  BP: 156/90 (!) 160/79 (!) 154/83 (!) 150/78  Pulse: 86 85 81 82  Resp: 13   17  Temp: 98.4 F (36.9 C) 98 F (36.7 C) 98.4 F (36.9 C) 98.1 F (36.7 C)  TempSrc: Oral Oral Oral Oral  SpO2: 99% 100% 99% 100%  Weight:  203 lb (92.1 kg)    Height:      Physical Exam  Constitutional: He is oriented to person, place, and time. He appears well-developed and well-nourished.  Resting comfortably in the chair.  HENT:  Head: Normocephalic and atraumatic.  Cardiovascular: Normal rate and regular rhythm.  Exam reveals no gallop and no friction rub.   No murmur heard. Respiratory: Effort normal and breath sounds normal. No respiratory distress. He has no wheezes.  Neurological: He is alert and oriented to person, place, and time.     Assessment/Plan:  Principal Problem:   Symptomatic anemia Active Problems:   Diabetes mellitus ([place])   AKI (acute kidney injury) [facility])  Mr. DEVARION MCCLANAHAN is a 51 y.o. male with HTN, DM, and CKD who presents w/ symptomatic anemia and AKI.  In summary, patient is status post 2 units of packed red blood cells. He is symptomatically improved. His creatinine is also slightly improved. He is hemodynamically stable. He is ready to go home this morning. He will have follow-up with nephrology in the outpatient setting.  1) AKI on CKD: Pt w/ declining renal function presents w/ significant rise in SCr accompanied by precipitous drop in Hgb w/o signs of active bleeding. Given 2 units of packed red blood cells in the emergency department. Recently received EPO shot  from his nephrologist. I suspect this anemia secondary to chronic kidney disease. He is no longer symptomatic He is hemodynamically stable. He is ready for discharge home with outpatient follow-up. Burnis Medin have outpatient follow-up with nephrology - May benefit from repeat CBC in the next 1-2 weeks  2) Symptomatic anemia of CKD: Hgb initially 7.1 from 10.6, now s/p 2U pRBCs. No evidence of acute BL w/ negative FOBT or significant concern for hemolysis. Posttransfusion hemoglobin 8.8. This is an appropriate response to 2 units of packed red blood cells. Anemia labs were ordered after units were given so their interpretation is marginal at best. He will need further characterization of his anemia in the outpatient setting.   3) Uncontrolled DM: A1c 13.1 on 01/13/17. BG initially 41 on presentation then resolved to 121. Home meds: Lantus 40U qHS + Regular 11U qAC + SSI. - Lantus 30U + Novolog 6U - SSI-r - Stressed importance of establishing with a primary care physician for ongoing management of diabetes and hypertension  4) HTN: BP 140s/80s on presentation. Home meds: amlodipine 10mg  qD, carvedilol 3.25mg  BID, valsartan 320mg  qD, furosemide 80mg  qAM + 40mg  qPM. Recently saw Dr. Claiborne Billings w/ cardiology who recommended holding valsartan in setting of SCr rise, increased Coreg to 6.25 in the setting of palpitaions. - Continue amlodipine 10, coreg 6.25 - We'll resume home medications at discharge  Dispo: Anticipated discharge today.  Ophelia Shoulder, MD 01/30/2017, 10:40 AM Pager: [phone]

## 2017-01-30 NOTE — Progress Notes (Addendum)
New Admission Note:  Arrival Method: On a stretch from ER. Mental Orientation: Alert and Oriented X4 Telemetry: Yes. [address] CCMD notified Assessment: Completed Skin: Assessed with Joaquim Lai. Check flowsheets. Iv: Left Wrist Pain: 0/10 Tubes: None Safety Measures: Safety Fall Prevention Plan discussed with patient. Admission: Completed [address] Orientation: Patient has been orientated to the room, unit and the staff.  Orders have been reviewed and implemented. Will continue to monitor the patient. Call light has been placed within reach.   Vassie Moselle, RN  Phone Number: [phone]

## 2017-01-30 NOTE — Progress Notes (Signed)
Disposition: home Discharged summary and instruction, follow up appointment reviewed with pt, verbalized understanding. Pneumonia vaccine given to pt prior to discharge, education and information reviewed. Iv: discontinued; cath intact, site clean and dry. Tele: discontinued; CCMD notified. Pt was escorted out of the unit with staff, took all of his belongings with him.

## 2017-01-30 NOTE — Discharge Summary (Signed)
Name: [name] [name] MRN: [id number] DOB: 12-06-1966 51 y.o. PCP: No Pcp Per Patient  Date of Admission: 01/29/2017  3:54 PM Date of Discharge: 01/30/2017 Attending Physician: Oval Linsey, MD  Discharge Diagnosis: 1. Symptomatic anemia 2. Acute on Chronic Kidney Disease Stage IV 3. Diabetes Mellitus  Principal Problem:   Symptomatic anemia Active Problems:   Diabetes mellitus ([place])   AKI (acute kidney injury) ([place])   Discharge Medications: Allergies as of 01/30/2017      Reactions   Penicillins Rash   Tolerating Ceftriaxone (03/12/12)      Medication List    TAKE these medications   amLODipine 5 MG tablet Commonly known as:  NORVASC Take 10 mg by mouth 2 (two) times daily.   aspirin 81 MG tablet Take 81 mg by mouth every morning.   carvedilol 6.25 MG tablet Commonly known as:  COREG Take 1 tablet (6.25 mg total) by mouth 2 (two) times daily.   furosemide 40 MG tablet Commonly known as:  LASIX Take 40 mg by mouth.   insulin glargine 100 UNIT/ML injection Commonly known as:  LANTUS Inject 40 Units into the skin at bedtime.   insulin regular 250 units/2.48mL (100 units/mL) injection Commonly known as:  NOVOLIN R,HUMULIN R Inject 11 Units into the skin 3 (three) times daily before meals. Sliding scale up to 15 addt'l units as needed for CBG levels       Disposition and follow-up:   Mr.[name] [name] was discharged from [facility] in Good condition.  At the hospital follow up visit please address:  1.  Please ensure the patient follows up with his nephrologist.  2.  Labs / imaging needed at time of follow-up: Repeat CBC  3.  Pending labs/ test needing follow-up: None  Follow-up Appointments:   Hospital Course by problem list: Principal Problem:   Symptomatic anemia Active Problems:   Diabetes mellitus ([place])   AKI (acute kidney injury) ([place])   1. Symptomatic anemia The patient presented to the [facility] emergency  department on 01/29/2017 after being referred from clinic secondary to a low hemoglobin at 7.1 with symptoms of fatigue and generalized weakness for the previous 2-3 weeks. During outpatient evaluation and management of his worsening chronic kidney disease the patient was recently given EPO and his hemoglobin was noted to have dropped to 7.1. Over this time he endorsed general fatigue, weakness and some shortness of breath. He denied chest pain, fevers abdominal pain, nausea vomiting or diarrhea. He denies dark stool or bright red blood per rectum. He was sent to the emergency department for blood transfusion. In the emergency department the patient was HDS and with a hemoglobin of 7.1. He received 2 units of packed red blood cells. His hemoglobin appropriately improved to 8.8 status post transfusion. Also, following the transfusion of 2 units of packed red blood cells the patient symptomatically improved. When seen on the morning of discharge on rounds the patient stated that his energy and fatigue had improved. He said he had felt better and more similar to his baseline. At that time he was hemodynamically stable and appropriate for discharge. Unfortunately, all workup labs for the patient's anemia were ordered after his transfusion making interpretation of these labs marginal at best. He would benefit from ongoing evaluation of his symptomatic anemia in the outpatient setting.  2. Acute on chronic kidney disease Stage IV The patient has CKD stage IV and has been slowly being worked up in the outpatient setting. At  the time of presentation to the emergency department the patient's creatinine had increased to 4.50. After administration of blood products the patient's creatinine on repeat laboratory evaluation had decreased to 4.02. I suspect part of the patient's acute on chronic kidney injury was secondary to dehydration, hypovolemia and decreased hemoglobin. The exact etiology of the patient's chronic kidney  disease is unknown but on chart review it appears his nephrologist thinks this is multifactorial secondary to poorly controlled hypertension and diabetes. He will need ongoing management and evaluation in the outpatient setting of his chronic kidney disease.  3. Diabetes The patient has a history of poorly controlled diabetes. Most recent hemoglobin A1c of 13.1 on 01/13/2017. He currently does not have a primary care physician. He was noted to get somewhat defensive when his diabetes was discussed with other providers. He will need extremely close management and titration of his diabetes in order to preserve his remaining renal function. We discussed with him the importance of establishing with a primary care physician. He was amenable to this. He was discharged on his home diabetes regimen and was advised to follow-up with a primary care physician for ongoing management. Again we discussed with the patient that the importance of controlling his diabetes for his overall health and for improvement of his renal function.   Discharge Vitals:   BP (!) 150/78 (BP Location: Right Arm)   Pulse 82   Temp 98.1 F (36.7 C) (Oral)   Resp 17   Ht 6\' 2"  (1.88 m)   Wt 203 lb (92.1 kg)   SpO2 100%   BMI 26.06 kg/m   Pertinent Labs, Studies, and Procedures:  1. Status post 2 units of packed red blood cells 2. Creatinine of 4.5 on admission down trending to 4.02 at discharge 3. Most recent hemoglobin before discharge a 8.8  Discharge Instructions: Discharge Instructions    Diet - low sodium heart healthy    Complete by:  As directed    Discharge instructions    Complete by:  As directed    We have treated your low blood counts. While in the hospital we have given you 2 units of blood and your blood levels increased appropriately.   Your blood levels should continue to increase over the next several weeks since you just received the EPO shot from your kidney doctor.   Please continue to take your  medications as they are prescribed. I would advise discussing with your kidney doctor about additional medications that can be used to treat your high blood pressure and diabetes. Also, I think it is very important that you find another Primary Care Physician. If you would like a list of providers or a number to call please ask your nurse when he/she discusses these instructions with you. It will be important to mention to your PCP about the food getting stuck in your throat as they will decide which test you will need next.  Continue to work on diet and exercise as well as blood pressure and diabetes control as we discussed this morning.   Increase activity slowly    Complete by:  As directed       Signed: Ophelia Shoulder, MD 01/30/2017, 11:19 AM   Pager: [phone]

## 2017-01-31 LAB — HAPTOGLOBIN: Haptoglobin: 179 mg/dL (ref 34–200)

## 2017-02-03 DIAGNOSIS — N184 Chronic kidney disease, stage 4 (severe): Secondary | ICD-10-CM | POA: Diagnosis not present

## 2017-02-03 DIAGNOSIS — I10 Essential (primary) hypertension: Secondary | ICD-10-CM | POA: Diagnosis not present

## 2017-02-03 DIAGNOSIS — D631 Anemia in chronic kidney disease: Secondary | ICD-10-CM | POA: Diagnosis not present

## 2017-02-11 ENCOUNTER — Encounter: Payer: Self-pay | Admitting: Internal Medicine

## 2017-02-13 ENCOUNTER — Ambulatory Visit: Payer: 59 | Admitting: Cardiovascular Disease

## 2017-02-18 DIAGNOSIS — D631 Anemia in chronic kidney disease: Secondary | ICD-10-CM | POA: Diagnosis not present

## 2017-02-18 DIAGNOSIS — N184 Chronic kidney disease, stage 4 (severe): Secondary | ICD-10-CM | POA: Diagnosis not present

## 2017-02-18 DIAGNOSIS — N2581 Secondary hyperparathyroidism of renal origin: Secondary | ICD-10-CM | POA: Diagnosis not present

## 2017-02-20 DIAGNOSIS — I10 Essential (primary) hypertension: Secondary | ICD-10-CM | POA: Diagnosis not present

## 2017-02-20 DIAGNOSIS — D631 Anemia in chronic kidney disease: Secondary | ICD-10-CM | POA: Diagnosis not present

## 2017-02-20 DIAGNOSIS — N184 Chronic kidney disease, stage 4 (severe): Secondary | ICD-10-CM | POA: Diagnosis not present

## 2017-02-26 ENCOUNTER — Encounter (HOSPITAL_COMMUNITY)
Admission: RE | Admit: 2017-02-26 | Discharge: 2017-02-26 | Disposition: A | Discharge status: Home / Self Care | Payer: 59 | Source: Ambulatory Visit | Attending: Nephrology | Admitting: Nephrology

## 2017-02-26 DIAGNOSIS — Z5181 Encounter for therapeutic drug level monitoring: Secondary | ICD-10-CM | POA: Diagnosis not present

## 2017-02-26 DIAGNOSIS — Z79899 Other long term (current) drug therapy: Secondary | ICD-10-CM | POA: Diagnosis not present

## 2017-02-26 DIAGNOSIS — D649 Anemia, unspecified: Secondary | ICD-10-CM

## 2017-02-26 DIAGNOSIS — N184 Chronic kidney disease, stage 4 (severe): Secondary | ICD-10-CM | POA: Diagnosis not present

## 2017-02-26 DIAGNOSIS — D631 Anemia in chronic kidney disease: Secondary | ICD-10-CM | POA: Diagnosis not present

## 2017-02-26 LAB — POCT HEMOGLOBIN-HEMACUE: Hemoglobin: 10.8 g/dL — ABNORMAL LOW (ref 13.0–17.0)

## 2017-02-26 MED ORDER — DARBEPOETIN ALFA 150 MCG/0.3ML IJ SOSY
120.0000 ug | PREFILLED_SYRINGE | INTRAMUSCULAR | Status: DC
  Administered 2017-02-26: 120 ug via SUBCUTANEOUS

## 2017-02-26 MED ORDER — DARBEPOETIN ALFA 60 MCG/0.3ML IJ SOSY
PREFILLED_SYRINGE | INTRAMUSCULAR | Status: AC
  Filled 2017-02-26: qty 0.6

## 2017-02-28 ENCOUNTER — Telehealth: Payer: Self-pay | Admitting: Cardiovascular Disease

## 2017-02-28 NOTE — Telephone Encounter (Signed)
Received records from [facility] for appointment on 03/05/17 with Dr Claiborne Billings.  Records put with Dr Evette Georges schedule for 03/05/17. lp

## 2017-03-05 ENCOUNTER — Ambulatory Visit: Payer: 59 | Admitting: Cardiovascular Disease

## 2017-03-05 MED FILL — Darbepoetin Alfa Soln Prefilled Syringe 60 MCG/0.3ML: INTRAMUSCULAR | Qty: 0.6 | Status: AC

## 2017-03-25 ENCOUNTER — Ambulatory Visit (INDEPENDENT_AMBULATORY_CARE_PROVIDER_SITE_OTHER): Payer: 59 | Admitting: Cardiovascular Disease

## 2017-03-25 ENCOUNTER — Encounter: Payer: Self-pay | Admitting: Cardiovascular Disease

## 2017-03-25 VITALS — BP 164/86 | HR 75 | Ht 74.0 in | Wt 214.0 lb

## 2017-03-25 DIAGNOSIS — E1122 Type 2 diabetes mellitus with diabetic chronic kidney disease: Secondary | ICD-10-CM

## 2017-03-25 DIAGNOSIS — G4733 Obstructive sleep apnea (adult) (pediatric): Secondary | ICD-10-CM

## 2017-03-25 DIAGNOSIS — I10 Essential (primary) hypertension: Secondary | ICD-10-CM | POA: Diagnosis not present

## 2017-03-25 DIAGNOSIS — E1165 Type 2 diabetes mellitus with hyperglycemia: Secondary | ICD-10-CM | POA: Diagnosis not present

## 2017-03-25 DIAGNOSIS — R002 Palpitations: Secondary | ICD-10-CM | POA: Diagnosis not present

## 2017-03-25 DIAGNOSIS — N185 Chronic kidney disease, stage 5: Secondary | ICD-10-CM

## 2017-03-25 DIAGNOSIS — Z794 Long term (current) use of insulin: Secondary | ICD-10-CM | POA: Diagnosis not present

## 2017-03-25 DIAGNOSIS — D631 Anemia in chronic kidney disease: Secondary | ICD-10-CM

## 2017-03-25 MED ORDER — CARVEDILOL 25 MG PO TABS: 37.5000 mg | ORAL_TABLET | Freq: Two times a day (BID) | ORAL | 11 refills | Status: AC
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
## 2017-03-25 NOTE — Patient Instructions (Signed)
Your physician has recommended you make the following change in your medication:   1.) the carvedilol has been changed to  37.5 mg twice a day.  ( 1& 1/2 tablet). A new prescription has been sent to your pharmacy.  Your physician recommends that you schedule a follow-up appointment in: 2-3 months.

## 2017-03-28 ENCOUNTER — Encounter (HOSPITAL_COMMUNITY): Payer: 59

## 2017-03-28 ENCOUNTER — Other Ambulatory Visit: Payer: Self-pay

## 2017-03-28 ENCOUNTER — Telehealth: Payer: Self-pay | Admitting: Endocrinology

## 2017-03-28 ENCOUNTER — Encounter: Payer: Self-pay | Admitting: Endocrinology

## 2017-03-28 ENCOUNTER — Ambulatory Visit (INDEPENDENT_AMBULATORY_CARE_PROVIDER_SITE_OTHER): Payer: 59 | Admitting: Endocrinology

## 2017-03-28 VITALS — BP 150/80 | HR 73 | Ht 74.0 in | Wt 211.0 lb

## 2017-03-28 DIAGNOSIS — E1122 Type 2 diabetes mellitus with diabetic chronic kidney disease: Secondary | ICD-10-CM

## 2017-03-28 DIAGNOSIS — E1142 Type 2 diabetes mellitus with diabetic polyneuropathy: Secondary | ICD-10-CM | POA: Diagnosis not present

## 2017-03-28 DIAGNOSIS — N185 Chronic kidney disease, stage 5: Secondary | ICD-10-CM

## 2017-03-28 DIAGNOSIS — Z794 Long term (current) use of insulin: Secondary | ICD-10-CM | POA: Diagnosis not present

## 2017-03-28 LAB — POCT GLYCOSYLATED HEMOGLOBIN (HGB A1C): Hemoglobin A1C: 6.3

## 2017-03-28 MED ORDER — INSULIN DEGLUDEC 100 UNIT/ML ~~LOC~~ SOPN: 38.0000 [IU] | PEN_INJECTOR | Freq: Every day | SUBCUTANEOUS | 2 refills | Status: DC

## 2017-03-28 MED ORDER — INSULIN PEN NEEDLE 31G X 5 MM MISC: 4 refills | Status: DC

## 2017-03-28 MED ORDER — FREESTYLE LIBRE SENSOR SYSTEM MISC: 1.0000 | 3 refills | Status: DC

## 2017-03-28 MED ORDER — FREESTYLE LIBRE READER DEVI: 1.0000 | 0 refills | Status: DC

## 2017-03-28 NOTE — Patient Instructions (Addendum)
Lantus 38 units daily  Check blood sugars on waking up  3-4x weekly  Also check blood sugars about 2 hours after a meal and do this after different meals by rotation  Recommended blood sugar levels on waking up is 90-130 and about 2 hours after meal is 130-160  Please bring your blood sugar monitor to each visit, thank you  Check cost of Tyler Aas, Toujeo instead of Lantus  Also look into V-go  Humalog replaces Novolin

## 2017-03-28 NOTE — Telephone Encounter (Signed)
Pt will need his prescriptions to be sent all in 90 days supply to [facility] for insurance purposes.

## 2017-03-28 NOTE — Progress Notes (Signed)
Patient ID: [name] [name], male   DOB: 1966/01/07, 51 y.o.   MRN: [id number]            Reason for Appointment: Consultation for Type 2 Diabetes  Referring physician: Dr. Shelva Majestic   History of Present Illness:          Date of diagnosis of type 2 diabetes mellitus: 2001       Background history:   He was apparently started on insulin 2 or 3 years after his initial diagnosis He was on various insulin regimens, mostly Lantus and NovoLog and at some point was also on NPH and regular His blood sugar control has been typically poor with A1c as high as 15.7  Recent history:   INSULIN regimen is:  40 Lantus at 6 pm, Novolin R with syringe at meal times: 5-10 units at breakfast and 11-15 units at suppertime  Non-insulin hypoglycemic drugs the patient is taking are: None  Current management, blood sugar patterns and problems identified:  He did not bring his monitor for download today  Although his blood sugars were high with A1c 13.1% in January he says he has made significant changes in his diet because of his renal failure and his blood sugars are improving  He works night shifts and his meals are mostly when he comes  back home and in the early evening; during his work hours he will have snacks like yogurt or fruit but no scheduled meal  Not clear how often he is checking his blood sugars but mostly when he comes back from work and some before and after his evening meal  He thinks his highest blood sugars may be if he checks them at his work hours; he does not take any insulin to cover his score bike or snacks at work.  He thinks his blood sugars are generally below 100 when he comes back from work in the morning and he will take a relatively small amount of regular insulin to cover his breakfast meal  He does not adjust his LANTUS based on his fasting readings which are relatively lower around 90  At times he will wake up during his sleep with low sugar symptoms, about  twice a week        Side effects from medications have been: None  Compliance with the medical regimen: Improved Hypoglycemia: 2-3x per week   Glucose monitoring:  done ?  1-2 times a day         Glucometer: Relion      Blood Glucose readings by time of day and averages from meter download:  PREMEAL Breakfast Lunch Dinner Bedtime  Overall   Glucose range: <100  90    Median:        POST-MEAL PC Breakfast PC Lunch PC Dinner  Glucose range:   140-150  Median:      Self-care: The diet that the patient has been following is: tries to limit high-fat foods.     Typical meal intake: Breakfast is generally cream of wheat, egg toast at 8am                Dietician visit, most recent: Few months ago               Exercise:  none now  Weight history:  Wt Readings from Last 3 Encounters:  03/28/17 211 lb (95.7 kg)  03/25/17 214 lb (97.1 kg)  01/29/17 203 lb (92.1 kg)    Glycemic control:  Lab Results  Component Value Date   HGBA1C 6.3 03/28/2017   HGBA1C 13.1 01/13/2017   HGBA1C 15.7 (H) 03/12/2012   Lab Results  Component Value Date   MICROALBUR 63.31 (H) 03/13/2012   CREATININE 4.02 (H) 01/30/2017   Lab Results  Component Value Date   MICRALBCREAT 614.7 (H) 03/13/2012    No results found for: FRUCTOSAMINE    Allergies as of 03/28/2017      Reactions   Penicillins Rash   Tolerating Ceftriaxone (03/12/12)      Medication List       Accurate as of 03/28/17  5:00 PM. Always use your most recent med list.          amLODipine 10 MG tablet Commonly known as:  NORVASC Take 1 tablet by mouth daily.   aspirin 81 MG tablet Take 81 mg by mouth every morning.   carvedilol 25 MG tablet Commonly known as:  COREG Take 1.5 tablets (37.5 mg total) by mouth 2 (two) times daily with a meal.   FREESTYLE LIBRE READER Devi 1 Device by Does not apply route as directed.   FREESTYLE LIBRE SENSOR SYSTEM Misc 1 strip by Does not apply route once a week. Apply to upper arm  and change sensor every 10 days   furosemide 80 MG tablet Commonly known as:  LASIX Take 1 tablet by mouth 2 (two) times daily.   insulin degludec 100 UNIT/ML Sopn FlexTouch Pen Commonly known as:  TRESIBA FLEXTOUCH Inject 0.38 mLs (38 Units total) into the skin daily at 10 pm.   insulin glargine 100 UNIT/ML injection Commonly known as:  LANTUS Inject 40 Units into the skin at bedtime.   Insulin Pen Needle 31G X 5 MM Misc Use to inject [place] once daily   insulin regular 250 units/2.58mL (100 units/mL) injection Commonly known as:  NOVOLIN R,HUMULIN R Inject 11 Units into the skin 3 (three) times daily before meals. Sliding scale up to 15 addt'l units as needed for CBG levels       Allergies:  Allergies  Allergen Reactions  . Penicillins Rash    Tolerating Ceftriaxone (03/12/12)    Past Medical History:  Diagnosis Date  . Bacteremia   . Diabetes mellitus   . Hypertension   . Pneumonia 02/2012   Strep pneumoniae bilateral pneumonia complicated by bacteremia  . Renal disorder    End stage, not on dialysis    Past Surgical History:  Procedure Laterality Date  . CHEST TUBE INSERTION     placed during hospitalization 02/2012  . COLONOSCOPY  04/01/2012   Procedure: COLONOSCOPY;  Surgeon: Juanita Craver, MD;  Location: [facility] ENDOSCOPY;  Service: Endoscopy;  Laterality: N/A;  . ESOPHAGOGASTRODUODENOSCOPY  03/31/2012   Procedure: ESOPHAGOGASTRODUODENOSCOPY (EGD);  Surgeon: Beryle Beams, MD;  Location: [facility] ENDOSCOPY;  Service: Endoscopy;  Laterality: N/A;    Family History  Problem Relation Age of Onset  . Diabetes Maternal Grandmother     Social History:  reports that he has never smoked. He has never used smokeless tobacco. He reports that he drinks alcohol. He reports that he does not use drugs.   Review of Systems  Constitutional: Negative for weight loss.  Eyes: Negative for blurred vision.  Respiratory: Negative for shortness of breath.   Cardiovascular: Positive for  leg swelling.  Endocrine: Negative for fatigue.  Genitourinary: Negative for frequency.  Neurological: Positive for numbness.    Lipid history: No recent labs available   No results found for: CHOL, HDL, LDLCALC, LDLDIRECT,  TRIG, CHOLHDL         Hypertension:Treated with 10 mg amlodipine, carvedilol and Lasix followed by nephrologist  BP Readings from Last 3 Encounters:  03/28/17 (!) 150/80  03/25/17 (!) 164/86  02/26/17 (!) 160/85     Most recent eye exam was In early 2017, has had an intervention done by a retina surgeon and is due to see his primary ophthalmologist again  Most recent foot exam: 3/18    LABS:  Office Visit on 03/28/2017  Component Date Value Ref Range Status  . Hemoglobin A1C 03/28/2017 6.3   Final    Physical Examination:  BP (!) 150/80   Pulse 73   Ht 6\' 2"  (1.88 m)   Wt 211 lb (95.7 kg)   BMI 27.09 kg/m   GENERAL:     Averagely built and nourished HEENT:         Eye exam shows normal external appearance. Fundus exam shows no retinopathy. Oral exam shows normal mucosa .  NECK:   There is no lymphadenopathy Thyroid is not enlarged and no nodules felt.  Carotids are normal to palpation and no bruit heard LUNGS:         Chest is symmetrical. Lungs are clear to auscultation.Marland Kitchen   HEART:         Heart sounds:  S1 and S2 are normal. No murmur or click heard., no S3 or S4.   ABDOMEN:   There is no distention present. Liver and spleen are not palpable. No other mass or tenderness present.   NEUROLOGICAL:   Ankle jerks are absent bilaterally.    Diabetic Foot Exam - Simple   Simple Foot Form Diabetic Foot exam was performed with the following findings:  Yes   Visual Inspection No deformities, no ulcerations, no other skin breakdown bilaterally:  Yes Sensation Testing See comments:  Yes Pulse Check Posterior Tibialis and Dorsalis pulse intact bilaterally:  Yes Comments Monofilament sensation nearly absent on the left and minimal on the right distal  toes            Vibration sense is Absent on the left and markedly reduced in distal right first toes. MUSCULOSKELETAL:  There is no swelling or deformity of the peripheral joints. Spine is normal to inspection.   EXTREMITIES:     There is no edema. No skin lesions present.Marland Kitchen SKIN:       No rash or lesions of concern.        ASSESSMENT:  Diabetes type 2, uncontrolled, insulin-dependent for several years  See history of present illness for detailed discussion of current diabetes management, blood sugar patterns and problems identified He appears to had much better compliance with his diet and probably his insulin with A1c coming down to 6.3%  Difficult to know what his blood sugar patterns are since he is using a Generic monitor which he did not bring and is not keeping a record Most likely is also checking blood sugars only sometimes before meals He thinks his blood sugars are not high after his main meal in the evening However may have relatively high readings after his snacks during his work hours    He feels that he has a tendency to hypoglycemia overnight, this may be partly related to taking regular insulin instead of analog insulin at his morning meal after work  Complications of diabetes: Neuropathy, Nephropathy, proliferative retinopathy  RENAL insufficiency: He is apparently being told that he will need dialysis soon and will be possibly getting a vascular  access done  HYPERTENSION: Appears inadequately controlled, followed by cardiologist and nephrologist  PLAN:     Reduce Lantus to 38 units for now  Switch Lantus to Tresiba 38 units and if he still has low sugars on waking up he will reduce this to 36  He will look into the freestyle Libre sensor to help her monitor his blood sugar better and allow better analysis of his blood sugar patterns.  Showed him how this would be used and prescription sent to the pharmacy, co-pay card given  Also discussed use of the V-go pump  and discussed in detail how this would be used to help improve his blood sugar control, he wants to look into this further before deciding on this  He will have better mealtime insulin action with Humalog instead of regular insulin and also less tendency to late hypoglycemia and he will switch over to this using the insulin pens, this appears to be covered by his insurance  Will have to adjust his mealtime insulin based on his postprandial patterns and he needs to check blood sugars more often after meals  May also consider covering larger snacks at work with small amount of mealtime insulin  Consultation with dietitian is already scheduled for 4/17 and he will need to learn carbohydrate counting to help adjust his insulin based on carbohydrates, not clear what ratio he will need, most likely 1:10  Also would benefit from follow-up with diabetes educator  Patient Instructions  Lantus 38 units daily  Check blood sugars on waking up  3-4x weekly  Also check blood sugars about 2 hours after a meal and do this after different meals by rotation  Recommended blood sugar levels on waking up is 90-130 and about 2 hours after meal is 130-160  Please bring your blood sugar monitor to each visit, thank you  Check cost of Tyler Aas, Toujeo instead of Lantus  Also look into V-go  Humalog replaces Novolin     Counseling time on subjects discussed above is over 50% of today's 60 minute visit   Consultation note has been sent to the referring physician  [facility] 03/28/2017, 5:00 PM   Note: This office note was prepared with Dragon voice recognition system technology. Any transcriptional errors that result from this process are unintentional.

## 2017-03-28 NOTE — Telephone Encounter (Signed)
This has been ordered 

## 2017-03-30 MED ORDER — INSULIN ASPART 100 UNIT/ML FLEXPEN: PEN_INJECTOR | SUBCUTANEOUS | 1 refills | Status: DC

## 2017-03-31 ENCOUNTER — Other Ambulatory Visit: Payer: Self-pay

## 2017-03-31 ENCOUNTER — Encounter (HOSPITAL_COMMUNITY)
Admission: RE | Admit: 2017-03-31 | Discharge: 2017-03-31 | Disposition: A | Discharge status: Home / Self Care | Payer: 59 | Source: Ambulatory Visit | Attending: Nephrology | Admitting: Nephrology

## 2017-03-31 ENCOUNTER — Telehealth: Payer: Self-pay | Admitting: Endocrinology

## 2017-03-31 DIAGNOSIS — D649 Anemia, unspecified: Secondary | ICD-10-CM

## 2017-03-31 DIAGNOSIS — Z5181 Encounter for therapeutic drug level monitoring: Secondary | ICD-10-CM | POA: Insufficient documentation

## 2017-03-31 DIAGNOSIS — Z79899 Other long term (current) drug therapy: Secondary | ICD-10-CM | POA: Insufficient documentation

## 2017-03-31 DIAGNOSIS — N184 Chronic kidney disease, stage 4 (severe): Secondary | ICD-10-CM | POA: Insufficient documentation

## 2017-03-31 DIAGNOSIS — D631 Anemia in chronic kidney disease: Secondary | ICD-10-CM | POA: Insufficient documentation

## 2017-03-31 LAB — RENAL FUNCTION PANEL
Albumin: 3.3 g/dL — ABNORMAL LOW (ref 3.5–5.0)
Anion gap: 6 (ref 5–15)
BUN: 63 mg/dL — ABNORMAL HIGH (ref 6–20)
CO2: 26 mmol/L (ref 22–32)
Calcium: 7.9 mg/dL — ABNORMAL LOW (ref 8.9–10.3)
Chloride: 109 mmol/L (ref 101–111)
Creatinine, Ser: 3.46 mg/dL — ABNORMAL HIGH (ref 0.61–1.24)
GFR calc Af Amer: 22 mL/min — ABNORMAL LOW (ref >60)
GFR calc non Af Amer: 19 mL/min — ABNORMAL LOW (ref >60)
Glucose, Bld: 93 mg/dL (ref 65–99)
Phosphorus: 4.6 mg/dL (ref 2.5–4.6)
Potassium: 3.9 mmol/L (ref 3.5–5.1)
Sodium: 141 mmol/L (ref 135–145)

## 2017-03-31 LAB — IRON AND TIBC
Iron: 41 ug/dL — ABNORMAL LOW (ref 45–182)
Saturation Ratios: 16 % — ABNORMAL LOW (ref 17.9–39.5)
TIBC: 256 ug/dL (ref 250–450)
UIBC: 215 ug/dL

## 2017-03-31 LAB — POCT HEMOGLOBIN-HEMACUE: Hemoglobin: 11 g/dL — ABNORMAL LOW (ref 13.0–17.0)

## 2017-03-31 MED ORDER — DARBEPOETIN ALFA 60 MCG/0.3ML IJ SOSY
PREFILLED_SYRINGE | INTRAMUSCULAR | Status: AC
  Filled 2017-03-31: qty 0.6

## 2017-03-31 MED ORDER — DARBEPOETIN ALFA 150 MCG/0.3ML IJ SOSY
120.0000 ug | PREFILLED_SYRINGE | INTRAMUSCULAR | Status: DC
  Administered 2017-03-31: 120 ug via SUBCUTANEOUS

## 2017-03-31 MED ORDER — FREESTYLE LIBRE READER DEVI: 1.0000 | 0 refills | Status: DC

## 2017-03-31 MED ORDER — INSULIN DEGLUDEC 100 UNIT/ML ~~LOC~~ SOPN: 38.0000 [IU] | PEN_INJECTOR | Freq: Every day | SUBCUTANEOUS | 2 refills | Status: DC

## 2017-03-31 MED ORDER — FREESTYLE LIBRE SENSOR SYSTEM MISC: 1.0000 | 3 refills | Status: DC

## 2017-03-31 MED ORDER — INSULIN ASPART 100 UNIT/ML FLEXPEN: PEN_INJECTOR | SUBCUTANEOUS | 1 refills | Status: DC

## 2017-03-31 NOTE — Telephone Encounter (Signed)
Ordered

## 2017-03-31 NOTE — Telephone Encounter (Signed)
Pt presented today and is requesting the following 4 changes to medications/equipment: insulin aspart (NOVOLOG FLEXPEN) 100 UNIT/ML FlexPen needs to be sent for **90** days and [facility] [place]. insulin degludec (TRESIBA FLEXTOUCH) 100 UNIT/ML SOPN FlexTouch Pen needs to ALSO be sent for **90** days and also [facility] to the [address] [facility]. Pt is asking for the Rx for: Continuous Blood Gluc Receiver (FREESTYLE LIBRE READER) DEVI and Continuous Blood Gluc Sensor ([place]) MISC to both be sent to the [place] [facility] [address] [facility].  If you have any questions please contact Mr. Renault at [phone].

## 2017-04-01 MED FILL — Darbepoetin Alfa Soln Prefilled Syringe 60 MCG/0.3ML: INTRAMUSCULAR | Qty: 0.6 | Status: AC

## 2017-04-04 DIAGNOSIS — D631 Anemia in chronic kidney disease: Secondary | ICD-10-CM | POA: Diagnosis not present

## 2017-04-04 DIAGNOSIS — N184 Chronic kidney disease, stage 4 (severe): Secondary | ICD-10-CM | POA: Diagnosis not present

## 2017-04-04 DIAGNOSIS — I10 Essential (primary) hypertension: Secondary | ICD-10-CM | POA: Diagnosis not present

## 2017-04-08 ENCOUNTER — Ambulatory Visit: Payer: 59 | Admitting: Dietician

## 2017-04-08 ENCOUNTER — Other Ambulatory Visit: Payer: Self-pay

## 2017-04-08 ENCOUNTER — Telehealth: Payer: Self-pay | Admitting: Endocrinology

## 2017-04-08 ENCOUNTER — Encounter: Payer: 59 | Attending: Endocrinology | Admitting: Nutrition

## 2017-04-08 DIAGNOSIS — Z713 Dietary counseling and surveillance: Secondary | ICD-10-CM | POA: Diagnosis present

## 2017-04-08 DIAGNOSIS — Z794 Long term (current) use of insulin: Secondary | ICD-10-CM | POA: Insufficient documentation

## 2017-04-08 DIAGNOSIS — E119 Type 2 diabetes mellitus without complications: Secondary | ICD-10-CM

## 2017-04-08 DIAGNOSIS — E1122 Type 2 diabetes mellitus with diabetic chronic kidney disease: Secondary | ICD-10-CM | POA: Diagnosis not present

## 2017-04-08 DIAGNOSIS — N185 Chronic kidney disease, stage 5: Secondary | ICD-10-CM | POA: Insufficient documentation

## 2017-04-08 MED ORDER — INSULIN ASPART 100 UNIT/ML FLEXPEN: PEN_INJECTOR | SUBCUTANEOUS | 1 refills | Status: DC

## 2017-04-08 MED ORDER — FREESTYLE LIBRE SENSOR SYSTEM MISC: 1.0000 | 3 refills | Status: DC

## 2017-04-08 NOTE — Progress Notes (Signed)
This patient was shown how to prepare the site, and apply the sensor.  He was also shown how to start the sensor.  We discussed the difference between glucose readings and sensor readings and he reported good understanding of this. The sensor was inserted onto his abdomen, at his request, because his job requires him to get into tight fitting spaces, and he is afraid it would be knocked off. We discussed the arrows, and what they mean as well.  He was encouraged to read the manual.  He agreed to do this.

## 2017-04-08 NOTE — Patient Instructions (Signed)
Apply a new sensor every 10 days, Scan the sensor every 8 hours.   Call 800 telephone number in manual if questions.

## 2017-04-08 NOTE — Telephone Encounter (Signed)
[facility] stated patient need 90 day supply of insulin 45 ml insulin aspart (NOVOLOG FLEXPEN) 100 UNIT/ML FlexPen 15 pen    [facility]   [phone] [address], [place] [phone] (Phone) [phone] (Fax)

## 2017-04-08 NOTE — Telephone Encounter (Signed)
I spoke with [name] [name] verbally on this message. Patient was in need of a 90 day supply of the novolog. Rx was submitted on 03/31/2017 which stated 15 pens. Pharmacist stated the prescription was received as a 15 ML supply rather than a 15 pen supply. Rx resubmitted for a 45 ml (15 pen) prescription. Patient notified.

## 2017-04-10 DIAGNOSIS — M2012 Hallux valgus (acquired), left foot: Secondary | ICD-10-CM | POA: Diagnosis not present

## 2017-04-10 DIAGNOSIS — I872 Venous insufficiency (chronic) (peripheral): Secondary | ICD-10-CM | POA: Diagnosis not present

## 2017-04-10 DIAGNOSIS — M2011 Hallux valgus (acquired), right foot: Secondary | ICD-10-CM | POA: Diagnosis not present

## 2017-04-21 ENCOUNTER — Ambulatory Visit: Payer: 59 | Admitting: Endocrinology

## 2017-04-29 ENCOUNTER — Encounter (HOSPITAL_COMMUNITY): Payer: 59

## 2017-04-29 ENCOUNTER — Other Ambulatory Visit (HOSPITAL_COMMUNITY): Payer: Self-pay | Admitting: *Deleted

## 2017-04-29 DIAGNOSIS — Z01818 Encounter for other preprocedural examination: Secondary | ICD-10-CM | POA: Diagnosis not present

## 2017-04-29 DIAGNOSIS — I120 Hypertensive chronic kidney disease with stage 5 chronic kidney disease or end stage renal disease: Secondary | ICD-10-CM | POA: Diagnosis not present

## 2017-04-29 DIAGNOSIS — E1122 Type 2 diabetes mellitus with diabetic chronic kidney disease: Secondary | ICD-10-CM | POA: Diagnosis not present

## 2017-04-30 ENCOUNTER — Encounter (HOSPITAL_COMMUNITY)
Admission: RE | Admit: 2017-04-30 | Discharge: 2017-04-30 | Disposition: A | Discharge status: Home / Self Care | Payer: 59 | Source: Ambulatory Visit | Attending: Nephrology | Admitting: Nephrology

## 2017-04-30 DIAGNOSIS — N184 Chronic kidney disease, stage 4 (severe): Secondary | ICD-10-CM | POA: Insufficient documentation

## 2017-04-30 DIAGNOSIS — D631 Anemia in chronic kidney disease: Secondary | ICD-10-CM | POA: Insufficient documentation

## 2017-04-30 DIAGNOSIS — Z5181 Encounter for therapeutic drug level monitoring: Secondary | ICD-10-CM | POA: Insufficient documentation

## 2017-04-30 DIAGNOSIS — Z79899 Other long term (current) drug therapy: Secondary | ICD-10-CM | POA: Diagnosis not present

## 2017-04-30 DIAGNOSIS — D649 Anemia, unspecified: Secondary | ICD-10-CM

## 2017-04-30 LAB — RENAL FUNCTION PANEL
Albumin: 3 g/dL — ABNORMAL LOW (ref 3.5–5.0)
Anion gap: 8 (ref 5–15)
BUN: 58 mg/dL — ABNORMAL HIGH (ref 6–20)
CO2: 26 mmol/L (ref 22–32)
Calcium: 7 mg/dL — ABNORMAL LOW (ref 8.9–10.3)
Chloride: 104 mmol/L (ref 101–111)
Creatinine, Ser: 4.25 mg/dL — ABNORMAL HIGH (ref 0.61–1.24)
GFR calc Af Amer: 17 mL/min — ABNORMAL LOW (ref >60)
GFR calc non Af Amer: 15 mL/min — ABNORMAL LOW (ref >60)
Glucose, Bld: 179 mg/dL — ABNORMAL HIGH (ref 65–99)
Phosphorus: 5 mg/dL — ABNORMAL HIGH (ref 2.5–4.6)
Potassium: 4.3 mmol/L (ref 3.5–5.1)
Sodium: 138 mmol/L (ref 135–145)

## 2017-04-30 LAB — IRON AND TIBC
Iron: 35 ug/dL — ABNORMAL LOW (ref 45–182)
Saturation Ratios: 16 % — ABNORMAL LOW (ref 17.9–39.5)
TIBC: 223 ug/dL — ABNORMAL LOW (ref 250–450)
UIBC: 188 ug/dL

## 2017-04-30 LAB — FERRITIN: Ferritin: 581 ng/mL — ABNORMAL HIGH (ref 24–336)

## 2017-04-30 LAB — POCT HEMOGLOBIN-HEMACUE: Hemoglobin: 9.3 g/dL — ABNORMAL LOW (ref 13.0–17.0)

## 2017-04-30 MED ORDER — DARBEPOETIN ALFA 150 MCG/0.3ML IJ SOSY
120.0000 ug | PREFILLED_SYRINGE | INTRAMUSCULAR | Status: DC
  Administered 2017-04-30: 120 ug via SUBCUTANEOUS

## 2017-04-30 MED ORDER — DARBEPOETIN ALFA 150 MCG/0.3ML IJ SOSY
PREFILLED_SYRINGE | INTRAMUSCULAR | Status: AC
  Filled 2017-04-30: qty 0.3

## 2017-05-01 LAB — PTH, INTACT AND CALCIUM
Calcium, Total (PTH): 6.7 mg/dL — CL (ref 8.7–10.2)
PTH: 85 pg/mL — ABNORMAL HIGH (ref 15–65)

## 2017-05-02 ENCOUNTER — Encounter: Payer: 59 | Attending: Endocrinology | Admitting: Dietician

## 2017-05-02 ENCOUNTER — Encounter: Payer: Self-pay | Admitting: Dietician

## 2017-05-02 DIAGNOSIS — N185 Chronic kidney disease, stage 5: Secondary | ICD-10-CM | POA: Insufficient documentation

## 2017-05-02 DIAGNOSIS — Z713 Dietary counseling and surveillance: Secondary | ICD-10-CM | POA: Insufficient documentation

## 2017-05-02 DIAGNOSIS — E1122 Type 2 diabetes mellitus with diabetic chronic kidney disease: Secondary | ICD-10-CM | POA: Diagnosis not present

## 2017-05-02 DIAGNOSIS — Z794 Long term (current) use of insulin: Secondary | ICD-10-CM | POA: Diagnosis not present

## 2017-05-02 NOTE — Progress Notes (Signed)
Diabetes Self-Management Education  Visit Type: First/Initial  Appt. Start Time: 0800 Appt. 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 male with a diagnosis of Diabetes: Type 2.  Other hx includes HTN and stage 4 CKD.  He is followed by a Nephrologist and has been evaluated to be placed on a transplant list.  His last Hgb was 9.3 and he is followed for this.  Medications include:  Tyler Aas daily at 5 pm, Novolin R but currently is not needing due to low blood sugar.  He uses a [employer] and concerns that he is getting too much insulin and this will be evaluated further on his next appointment with Dr. Dwyane Dee.  Blood sugar during this appointment registered low.  Snack provided and patient waited until blood sugar increased.  Patient lives alone.  His youngest daughter is now in college.  He works nights at [employer].  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  He does his own cooking and batch cooks and eats the same thing for several days in a row.  On his days off he sleeps at night.  He works 3 on then 4 off then 4 on and 3 off.  ASSESSMENT  Height 6\' 2"  (1.88 m), weight 217 lb (98.4 kg). Body mass index is 27.86 kg/m.  UBW 190-195 lbs and currently is retaining fluid.  He is to see his nephrologist next week due to worsening kidney function.      Diabetes Self-Management Education - 05/02/17 0824      Visit Information   Visit Type First/Initial     Initial Visit   Diabetes Type Type 2   Are you currently following a meal plan? Yes   What type of meal plan do you follow? low sugar, low fat, low phosphorous, low sodium   Are you taking your medications as prescribed? Yes     Health Coping   How would you rate your overall health? Fair     Psychosocial Assessment   Patient Belief/Attitude about Diabetes Motivated to manage diabetes  but tired   Self-care  barriers None   Self-management support Doctor's office   Other persons present Patient   Patient Concerns Nutrition/Meal planning;Other (comment)  get more ideas about what he can eat.  bored with current eating    Special Needs None   Preferred Learning Style No preference indicated   Learning Readiness Ready   How often do you need to have someone help you when you read instructions, pamphlets, or other written materials from your doctor or pharmacy? 1 - Never   What is the last grade level you completed in school? 4 years college     Pre-Education Assessment   Patient understands the diabetes disease and treatment process. Demonstrates understanding / competency   Patient understands incorporating nutritional management into lifestyle. Needs Review   Patient undertands incorporating physical activity into lifestyle. Demonstrates understanding / competency   Patient understands using medications safely. Demonstrates understanding / competency   Patient understands monitoring blood glucose, interpreting and using results Needs Review   Patient understands prevention, detection, and treatment of acute complications. Needs Review   Patient understands prevention, detection, and treatment of chronic complications. Demonstrates understanding / competency   Patient understands how to develop strategies to address psychosocial issues. Needs Review   Patient understands how to develop strategies to  promote health/change behavior. Needs Review     Complications   Last HgB A1C per patient/outside source 6.3 %  03/28/17   How often do you check your blood sugar? > 4 times/day  Free Style Libre   Fasting Blood glucose range (mg/dL) 70-129   Postprandial Blood glucose range (mg/dL) 130-179   Number of hypoglycemic episodes per month 30  coming home from work daily     Dietary Intake   Breakfast 2 boiled egg whites, 2 strips [place] bacon, cream of wheat with boxed coconut milk, bagel thin with no  sugar added jelly  8 am   Lunch same as dinner  5 pm   Snack (afternoon) apple, celery with peanut butter  8 pm   Dinner baked sweet potato, steamed vegetables, plantains (fried), roasted pork chop  3 am   Snack (evening) coconut milk yogurt and 1 small box raisins   Beverage(s) water, occasional flavor packet, cranberry juice (healthy balance-6g sugar) or watered down apple juice     Exercise   Exercise Type Light (walking / raking leaves)  stationary bike but hurts to bed so has not been using (increased fluicd weight)   How many days per week to you exercise? 0   How many minutes per day do you exercise? 0   Total minutes per week of exercise 0     Patient Education   Previous Diabetes Education Yes (please comment)   Disease state  Other (comment)  CKD stage 4   Nutrition management  Role of diet in the treatment of diabetes and the relationship between the three main macronutrients and blood glucose level;Food label reading, portion sizes and measuring food.;Meal options for control of blood glucose level and chronic complications.;Meal timing in regards to the patients' current diabetes medication.;Other (comment)  CDK stage 4, strict low sodium   Physical activity and exercise  Role of exercise on diabetes management, blood pressure control and cardiac health.   Medications Reviewed patients medication for diabetes, action, purpose, timing of dose and side effects.   Acute complications Taught treatment of hypoglycemia - the 15 rule.   Chronic complications Identified and discussed with patient  current chronic complications   Psychosocial adjustment Worked with patient to identify barriers to care and solutions;Identified and addressed patients feelings and concerns about diabetes;Other (comment)  CKD   Personal strategies to promote health Helped patient develop diabetes management plan for (enter comment)  shift work     Individualized Goals (developed by patient)    Nutrition General guidelines for healthy choices and portions discussed   Physical Activity Not Applicable   Medications take my medication as prescribed   Monitoring  test my blood glucose as discussed   Problem Solving making foods taste well and getting variety in his diet withing restrictions   Reducing Risk examine blood glucose patterns;Other (comment)  follow low sodium diet   Health Coping discuss diabetes with (comment)  MD,RD     Post-Education Assessment   Patient understands the diabetes disease and treatment process. Demonstrates understanding / competency   Patient understands incorporating nutritional management into lifestyle. Demonstrates understanding / competency   Patient undertands incorporating physical activity into lifestyle. Demonstrates understanding / competency   Patient understands using medications safely. Demonstrates understanding / competency   Patient understands monitoring blood glucose, interpreting and using results Demonstrates understanding / competency   Patient understands prevention, detection, and treatment of acute complications. Demonstrates understanding / competency   Patient understands prevention, detection, and treatment of  chronic complications. Demonstrates understanding / competency   Patient understands how to develop strategies to address psychosocial issues. Demonstrates understanding / competency   Patient understands how to develop strategies to promote health/change behavior. Demonstrates understanding / competency     Outcomes   Expected Outcomes Demonstrated interest in learning. Expect positive outcomes   Future DMSE 4-6 wks   Program Status Completed      Individualized Plan for Diabetes Self-Management Training:   Learning Objective:  Patient will have a greater understanding of diabetes self-management. Patient education plan is to attend individual and/or group sessions per assessed needs and concerns. Discussed his  renal disease and low sodium, low phosphorous diet.  Currently potassium is normal so did not restrict but instructed to avoid potassium containing salt substitutes.    Plan:   Patient Instructions  Continue the low sodium diet.  Choose fresh meat rather than processed.  Continue to season with herbs, ginger, garlic powder, onion powder (and fresh).  Vinegar, lemon or lime juice also boost flavor.  Continue the low phosphorous diet.  Great job with the changes that you have made! Continue to read labels.  Consider adding a higher protein snack in the early morning when you are working to avoid a low blood sugar.  Snack prior to bed on your days off.  Keep glucose tabs and/or juice by your bed.      Expected Outcomes:  Demonstrated interest in learning. Expect positive outcomes  Education material provided: Meal plan card, My Plate and Snack sheet, Food pyramid for healthy eating with kidney disease  If problems or questions, patient to contact team via:  Phone  Future DSME appointment: 4-6 wks

## 2017-05-02 NOTE — Patient Instructions (Signed)
Continue the low sodium diet.  Choose fresh meat rather than processed.  Continue to season with herbs, ginger, garlic powder, onion powder (and fresh).  Vinegar, lemon or lime juice also boost flavor.  Continue the low phosphorous diet.  Great job with the changes that you have made! Continue to read labels.  Consider adding a higher protein snack in the early morning when you are working to avoid a low blood sugar.  Snack prior to bed on your days off.  Keep glucose tabs and/or juice by your bed.

## 2017-05-06 DIAGNOSIS — D631 Anemia in chronic kidney disease: Secondary | ICD-10-CM | POA: Diagnosis not present

## 2017-05-06 DIAGNOSIS — I10 Essential (primary) hypertension: Secondary | ICD-10-CM | POA: Diagnosis not present

## 2017-05-06 DIAGNOSIS — N184 Chronic kidney disease, stage 4 (severe): Secondary | ICD-10-CM | POA: Diagnosis not present

## 2017-05-15 ENCOUNTER — Other Ambulatory Visit: Payer: Self-pay | Admitting: Endocrinology

## 2017-05-27 ENCOUNTER — Other Ambulatory Visit (HOSPITAL_COMMUNITY): Payer: Self-pay | Admitting: *Deleted

## 2017-05-28 ENCOUNTER — Encounter (HOSPITAL_COMMUNITY)
Admission: RE | Admit: 2017-05-28 | Discharge: 2017-05-28 | Disposition: A | Discharge status: Home / Self Care | Payer: 59 | Source: Ambulatory Visit | Attending: Nephrology | Admitting: Nephrology

## 2017-05-28 DIAGNOSIS — N184 Chronic kidney disease, stage 4 (severe): Secondary | ICD-10-CM | POA: Insufficient documentation

## 2017-05-28 DIAGNOSIS — D649 Anemia, unspecified: Secondary | ICD-10-CM

## 2017-05-28 DIAGNOSIS — D631 Anemia in chronic kidney disease: Secondary | ICD-10-CM | POA: Diagnosis not present

## 2017-05-28 LAB — RENAL FUNCTION PANEL
Albumin: 3.3 g/dL — ABNORMAL LOW (ref 3.5–5.0)
Anion gap: 13 (ref 5–15)
BUN: 85 mg/dL — ABNORMAL HIGH (ref 6–20)
CO2: 24 mmol/L (ref 22–32)
Calcium: 7 mg/dL — ABNORMAL LOW (ref 8.9–10.3)
Chloride: 101 mmol/L (ref 101–111)
Creatinine, Ser: 4.87 mg/dL — ABNORMAL HIGH (ref 0.61–1.24)
GFR calc Af Amer: 15 mL/min — ABNORMAL LOW (ref >60)
GFR calc non Af Amer: 13 mL/min — ABNORMAL LOW (ref >60)
Glucose, Bld: 311 mg/dL — ABNORMAL HIGH (ref 65–99)
Phosphorus: 6.1 mg/dL — ABNORMAL HIGH (ref 2.5–4.6)
Potassium: 3.8 mmol/L (ref 3.5–5.1)
Sodium: 138 mmol/L (ref 135–145)

## 2017-05-28 LAB — IRON AND TIBC
Iron: 53 ug/dL (ref 45–182)
Saturation Ratios: 22 % (ref 17.9–39.5)
TIBC: 239 ug/dL — ABNORMAL LOW (ref 250–450)
UIBC: 186 ug/dL

## 2017-05-28 LAB — POCT HEMOGLOBIN-HEMACUE: Hemoglobin: 9.4 g/dL — ABNORMAL LOW (ref 13.0–17.0)

## 2017-05-28 MED ORDER — DARBEPOETIN ALFA 150 MCG/0.3ML IJ SOSY
150.0000 ug | PREFILLED_SYRINGE | INTRAMUSCULAR | Status: DC
  Administered 2017-05-28: 150 ug via SUBCUTANEOUS

## 2017-05-28 MED ORDER — SODIUM CHLORIDE 0.9 % IV SOLN
510.0000 mg | INTRAVENOUS | Status: DC
  Administered 2017-05-28: 510 mg via INTRAVENOUS
  Filled 2017-05-28: qty 17

## 2017-05-28 MED ORDER — DARBEPOETIN ALFA 150 MCG/0.3ML IJ SOSY
PREFILLED_SYRINGE | INTRAMUSCULAR | Status: AC
  Filled 2017-05-28: qty 0.3

## 2017-05-28 NOTE — Discharge Instructions (Signed)

## 2017-05-29 DIAGNOSIS — N185 Chronic kidney disease, stage 5: Secondary | ICD-10-CM | POA: Diagnosis not present

## 2017-05-29 DIAGNOSIS — E1122 Type 2 diabetes mellitus with diabetic chronic kidney disease: Secondary | ICD-10-CM | POA: Diagnosis not present

## 2017-05-29 DIAGNOSIS — Z01818 Encounter for other preprocedural examination: Secondary | ICD-10-CM | POA: Diagnosis not present

## 2017-05-29 LAB — VITAMIN D 25 HYDROXY (VIT D DEFICIENCY, FRACTURES): Vit D, 25-Hydroxy: 20.8 ng/mL — ABNORMAL LOW (ref 30.0–100.0)

## 2017-06-04 NOTE — Progress Notes (Deleted)
Patient ID: [name] [name], male   DOB: Oct 04, 1966, 51 y.o.   MRN: [id number]            Reason for Appointment: Consultation for Type 2 Diabetes  Referring physician: Dr. Shelva Majestic   History of Present Illness:          Date of diagnosis of type 2 diabetes mellitus: 2001       Background history:   He was apparently started on insulin 2 or 3 years after his initial diagnosis He was on various insulin regimens, mostly Lantus and NovoLog and at some point was also on NPH and regular His blood sugar control has been typically poor with A1c as high as 15.7  Recent history:   INSULIN regimen is:  40 Lantus at 6 pm, Novolin R with syringe at meal times: 5-10 units at breakfast and 11-15 units at suppertime  Non-insulin hypoglycemic drugs the patient is taking are: None  Current management, blood sugar patterns and problems identified:  He did not bring his monitor for download today  Although his blood sugars were high with A1c 13.1% in January he says he has made significant changes in his diet because of his renal failure and his blood sugars are improving  He works night shifts and his meals are mostly when he comes  back home and in the early evening; during his work hours he will have snacks like yogurt or fruit but no scheduled meal  Not clear how often he is checking his blood sugars but mostly when he comes back from work and some before and after his evening meal  He thinks his highest blood sugars may be if he checks them at his work hours; he does not take any insulin to cover his score bike or snacks at work.  He thinks his blood sugars are generally below 100 when he comes back from work in the morning and he will take a relatively small amount of regular insulin to cover his breakfast meal  He does not adjust his LANTUS based on his fasting readings which are relatively lower around 90  At times he will wake up during his sleep with low sugar symptoms, about  twice a week        Side effects from medications have been: None  Compliance with the medical regimen: Improved Hypoglycemia: 2-3x per week   Glucose monitoring:  done ?  1-2 times a day         Glucometer: Relion      Blood Glucose readings by time of day and averages..   from meter download:  PREMEAL Breakfast Lunch Dinner Bedtime  Overall   Glucose range: <100  90    Median:        POST-MEAL PC Breakfast PC Lunch PC Dinner  Glucose range:   140-150  Median:      Self-care: The diet that the patient has been following is: tries to limit high-fat foods.     Typical meal intake: Breakfast is generally cream of wheat, egg toast at 8am                Dietician visit, most recent: Few months ago               Exercise:  none now  Weight history:  Wt Readings from Last 3 Encounters:  05/28/17 205 lb (93 kg)  05/02/17 217 lb (98.4 kg)  04/30/17 215 lb (97.5 kg)    Glycemic control:  Lab Results  Component Value Date   HGBA1C 6.3 03/28/2017   HGBA1C 13.1 01/13/2017   HGBA1C 15.7 (H) 03/12/2012   Lab Results  Component Value Date   MICROALBUR 63.31 (H) 03/13/2012   CREATININE 4.87 (H) 05/28/2017   Lab Results  Component Value Date   MICRALBCREAT 614.7 (H) 03/13/2012    No results found for: FRUCTOSAMINE    Allergies as of 06/05/2017      Reactions   Penicillins Rash   Tolerating Ceftriaxone (03/12/12)      Medication List       Accurate as of 06/04/17  9:03 PM. Always use your most recent med list.          amLODipine 10 MG tablet Commonly known as:  NORVASC Take 1 tablet by mouth daily.   aspirin 81 MG tablet Take 81 mg by mouth every morning.   carvedilol 25 MG tablet Commonly known as:  COREG Take 1.5 tablets (37.5 mg total) by mouth 2 (two) times daily with a meal.   FREESTYLE LIBRE READER Devi 1 Device by Does not apply route as directed.   FREESTYLE LIBRE SENSOR SYSTEM Misc 1 strip by Does not apply route once a week. Apply to  upper arm and change sensor every 10 days   furosemide 80 MG tablet Commonly known as:  LASIX Take 1 tablet by mouth 2 (two) times daily.   insulin aspart 100 UNIT/ML FlexPen Commonly known as:  NOVOLOG FLEXPEN 5-15 units before meals   insulin glargine 100 UNIT/ML injection Commonly known as:  LANTUS Inject 40 Units into the skin at bedtime.   Insulin Pen Needle 31G X 5 MM Misc Use to inject [place] once daily   insulin regular 100 units/mL injection Commonly known as:  NOVOLIN R,HUMULIN R Inject 11 Units into the skin 3 (three) times daily before meals. Sliding scale up to 15 addt'l units as needed for CBG levels   RENAL VITAMIN PO Take by mouth.   TRESIBA FLEXTOUCH 100 UNIT/ML Sopn FlexTouch Pen Generic drug:  insulin degludec INJECT 0.38 MLS (38 UNITS TOTAL) INTO THE SKIN DAILY AT 10 PM.       Allergies:  Allergies  Allergen Reactions  . Penicillins Rash    Tolerating Ceftriaxone (03/12/12)    Past Medical History:  Diagnosis Date  . Bacteremia   . Diabetes mellitus   . Hypertension   . Pneumonia 02/2012   Strep pneumoniae bilateral pneumonia complicated by bacteremia  . Renal disorder    End stage, not on dialysis    Past Surgical History:  Procedure Laterality Date  . CHEST TUBE INSERTION     placed during hospitalization 02/2012  . COLONOSCOPY  04/01/2012   Procedure: COLONOSCOPY;  Surgeon: Juanita Craver, MD;  Location: [facility] ENDOSCOPY;  Service: Endoscopy;  Laterality: N/A;  . ESOPHAGOGASTRODUODENOSCOPY  03/31/2012   Procedure: ESOPHAGOGASTRODUODENOSCOPY (EGD);  Surgeon: Beryle Beams, MD;  Location[facility] ENDOSCOPY;  Service: Endoscopy;  Laterality: N/A;    Family History  Problem Relation Age of Onset  . Diabetes Maternal Grandmother     Social History:  reports that he has never smoked. He has never used smokeless tobacco. He reports that he drinks alcohol. He reports that he does not use drugs.   Review of Systems  Constitutional: Negative for weight  loss.  Eyes: Negative for blurred vision.  Respiratory: Negative for shortness of breath.   Cardiovascular: Positive for leg swelling.  Endocrine: Negative for fatigue.  Genitourinary: Negative for frequency.  Neurological:  Positive for numbness.    Lipid history: No recent labs available   No results found for: CHOL, HDL, LDLCALC, LDLDIRECT, TRIG, CHOLHDL         Hypertension:Treated with 10 mg amlodipine, carvedilol and Lasix followed by nephrologist  BP Readings from Last 3 Encounters:  05/28/17 (!) 142/71  04/30/17 (!) 180/83  03/31/17 (!) 163/66     Most recent eye exam was In early 2017, has had an intervention done by a retina surgeon and is due to see his primary ophthalmologist again  Most recent foot exam: 3/18    LABS:  No visits with results within 1 Week(s) from this visit.  Latest known visit with results is:  Hospital Outpatient Visit on 05/28/2017  Component Date Value Ref Range Status  . Iron 05/28/2017 53  45 - 182 ug/dL Final  . TIBC 05/28/2017 239* 250 - 450 ug/dL Final  . Saturation Ratios 05/28/2017 22  17.9 - 39.5 % Final  . UIBC 05/28/2017 186  ug/dL Final  . Sodium 05/28/2017 138  135 - 145 mmol/L Final  . Potassium 05/28/2017 3.8  3.5 - 5.1 mmol/L Final  . Chloride 05/28/2017 101  101 - 111 mmol/L Final  . CO2 05/28/2017 24  22 - 32 mmol/L Final  . Glucose, Bld 05/28/2017 311* 65 - 99 mg/dL Final  . BUN 05/28/2017 85* 6 - 20 mg/dL Final  . Creatinine, Ser 05/28/2017 4.87* 0.61 - 1.24 mg/dL Final  . Calcium 05/28/2017 7.0* 8.9 - 10.3 mg/dL Final  . Phosphorus 05/28/2017 6.1* 2.5 - 4.6 mg/dL Final  . Albumin 05/28/2017 3.3* 3.5 - 5.0 g/dL Final  . GFR calc non Af Amer 05/28/2017 13* >60 mL/min Final  . GFR calc Af Amer 05/28/2017 15* >60 mL/min Final   Comment: (NOTE) The eGFR has been calculated using the CKD EPI equation. This calculation has not been validated in all clinical situations. eGFR's persistently <60 mL/min signify possible  Chronic Kidney Disease.   . Anion gap 05/28/2017 13  5 - 15 Final  . Vit D, 25-Hydroxy 05/28/2017 20.8* 30.0 - 100.0 ng/mL Final   Comment: (NOTE) Vitamin D deficiency has been defined by the [place] practice guideline as a level of serum 25-OH vitamin D less than 20 ng/mL (1,2). The Endocrine Society went on to further define vitamin D insufficiency as a level between 21 and 29 ng/mL (2). 1. IOM (Institute of Medicine). 2010. Dietary reference   intakes for calcium and D. [place]: The   [employer]. 2. Holick MF, Binkley [place], Bischoff-Ferrari HA, et al.   Evaluation, treatment, and prevention of vitamin D   deficiency: an Endocrine Society clinical practice   guideline. JCEM. 2011 Jul; 96(7):1911-30. Performed At: [facility] [place], [place] [id number] Lindon Romp MD WS:[id number]   . Hemoglobin 05/28/2017 9.4* 13.0 - 17.0 g/dL Final    Physical Examination:  There were no vitals taken for this visit.  GENERAL:     Averagely built and nourished HEENT:         Eye exam shows normal external appearance. Fundus exam shows no retinopathy. Oral exam shows normal mucosa .  NECK:   There is no lymphadenopathy Thyroid is not enlarged and no nodules felt.  Carotids are normal to palpation and no bruit heard LUNGS:         Chest is symmetrical. Lungs are clear to auscultation.Marland Kitchen   HEART:  Heart sounds:  S1 and S2 are normal. No murmur or click heard., no S3 or S4.   ABDOMEN:   There is no distention present. Liver and spleen are not palpable. No other mass or tenderness present.   NEUROLOGICAL:   Ankle jerks are absent bilaterally.    Diabetic Foot Exam - Simple   No data filed             Vibration sense is Absent on the left and markedly reduced in distal right first toes. MUSCULOSKELETAL:  There is no swelling or deformity of the peripheral joints. Spine is normal to inspection.     EXTREMITIES:     There is no edema. No skin lesions present.Marland Kitchen SKIN:       No rash or lesions of concern.        ASSESSMENT:  Diabetes type 2, uncontrolled, insulin-dependent for several years  See history of present illness for detailed discussion of current diabetes management, blood sugar patterns and problems identified He appears to had much better compliance with his diet and probably his insulin with A1c coming down to 6.3%  Difficult to know what his blood sugar patterns are since he is using a Generic monitor which he did not bring and is not keeping a record Most likely is also checking blood sugars only sometimes before meals He thinks his blood sugars are not high after his main meal in the evening However may have relatively high readings after his snacks during his work hours    He feels that he has a tendency to hypoglycemia overnight, this may be partly related to taking regular insulin instead of analog insulin at his morning meal after work  Complications of diabetes: Neuropathy, Nephropathy, proliferative retinopathy  RENAL insufficiency: He is apparently being told that he will need dialysis soon and will be possibly getting a vascular access done  HYPERTENSION: Appears inadequately controlled, followed by cardiologist and nephrologist  PLAN:     Reduce Lantus to 38 units for now  Switch Lantus to Tresiba 38 units and if he still has low sugars on waking up he will reduce this to 36  He will look into the freestyle Libre sensor to help her monitor his blood sugar better and allow better analysis of his blood sugar patterns.  Showed him how this would be used and prescription sent to the pharmacy, co-pay card given  Also discussed use of the V-go pump and discussed in detail how this would be used to help improve his blood sugar control, he wants to look into this further before deciding on this  He will have better mealtime insulin action with Humalog instead of  regular insulin and also less tendency to late hypoglycemia and he will switch over to this using the insulin pens, this appears to be covered by his insurance  Will have to adjust his mealtime insulin based on his postprandial patterns and he needs to check blood sugars more often after meals  May also consider covering larger snacks at work with small amount of mealtime insulin  Consultation with dietitian is already scheduled for 4/17 and he will need to learn carbohydrate counting to help adjust his insulin based on carbohydrates, not clear what ratio he will need, most likely 1:10  Also would benefit from follow-up with diabetes educator  There are no Patient Instructions on file for this visit. Counseling time on subjects discussed above is over 50% of today's 60 minute visit   Consultation note has  been sent to the referring physician  [facility] 06/04/2017, 9:03 PM   Note: This office note was prepared with Dragon voice recognition system technology. Any transcriptional errors that result from this process are unintentional.

## 2017-06-05 ENCOUNTER — Encounter: Payer: Self-pay | Admitting: Dietician

## 2017-06-05 ENCOUNTER — Encounter: Payer: 59 | Attending: Endocrinology | Admitting: Dietician

## 2017-06-05 ENCOUNTER — Ambulatory Visit: Payer: 59 | Admitting: Endocrinology

## 2017-06-05 ENCOUNTER — Other Ambulatory Visit (INDEPENDENT_AMBULATORY_CARE_PROVIDER_SITE_OTHER): Payer: 59

## 2017-06-05 ENCOUNTER — Other Ambulatory Visit: Payer: Self-pay

## 2017-06-05 ENCOUNTER — Other Ambulatory Visit: Payer: Self-pay | Admitting: Endocrinology

## 2017-06-05 DIAGNOSIS — Z794 Long term (current) use of insulin: Principal | ICD-10-CM

## 2017-06-05 DIAGNOSIS — Z713 Dietary counseling and surveillance: Secondary | ICD-10-CM | POA: Diagnosis not present

## 2017-06-05 DIAGNOSIS — N185 Chronic kidney disease, stage 5: Secondary | ICD-10-CM

## 2017-06-05 DIAGNOSIS — E1122 Type 2 diabetes mellitus with diabetic chronic kidney disease: Secondary | ICD-10-CM

## 2017-06-05 DIAGNOSIS — N184 Chronic kidney disease, stage 4 (severe): Secondary | ICD-10-CM

## 2017-06-05 LAB — LIPID PANEL
Cholesterol: 154 mg/dL (ref 0–200)
HDL: 41.3 mg/dL (ref >39.00)
LDL Cholesterol: 94 mg/dL (ref 0–99)
NonHDL: 112.47
Total CHOL/HDL Ratio: 4
Triglycerides: 93 mg/dL (ref 0.0–149.0)
VLDL: 18.6 mg/dL (ref 0.0–40.0)

## 2017-06-05 LAB — GLUCOSE, RANDOM: Glucose, Bld: 139 mg/dL — ABNORMAL HIGH (ref 70–99)

## 2017-06-05 MED ORDER — FREESTYLE LIBRE SENSOR SYSTEM MISC: 1.0000 | 3 refills | Status: DC

## 2017-06-05 MED ORDER — FREESTYLE LIBRE SENSOR SYSTEM MISC: 3 refills | Status: DC

## 2017-06-05 NOTE — Progress Notes (Signed)
Diabetes Self-Management Education  Visit Type:  Follow-up  Appt. Start Time: 0915 Appt. 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 male with a diagnosis of Diabetes: Type 2.  Type 2.  Other hx includes HTN and stage 4 CKD.  He is followed by a Nephrologist and has been evaluated to be placed on a transplant list.  His last Hgb was 9.4 and he is followed for this.  Labs include:  BUN 85, Creatine 4.87, Potassium 3.8, GFR 15.  Vitamin D 20 05/28/17.  He reports that he has lost weight due to fluid loss after diuretic was increased.  Weight was 217 1 month ago and 201 lbs today.  Medications include:  Tyler Aas daily at 5 pm, Novolog but currently is needing this infrequently.  He uses a Civil engineer, contracting.   He has started vitamin D and has gotten iron injection.  He noted that when he eats his dinner at 3 or after than his blood sugar is not as well controlled so he moved his dinner to 1 am when he works nights.  He reports overall good control of his blood sugar readings.  He had questions regarding a vegetarian diet which was discussed.  Protein needs are 60-70 grams without dialysis.    Patient lives alone.  His youngest daughter is now in college.  He works nights at [employer].  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  He does his own cooking and batch cooks and eats the same thing for several days in a row.  On his days off he sleeps at night.  He works 3 on then 4 off then 4 on and 3 off.   ASSESSMENT  Weight 201 lb (91.2 kg). Body mass index is 25.81 kg/m.  Wt Readings from Last 3 Encounters:  06/05/17 201 lb (91.2 kg)  05/28/17 205 lb (93 kg)  05/02/17 217 lb (98.4 kg)         Diabetes Self-Management Education - 06/05/17 1000      Initial Visit   What type of meal plan do you follow? low sugar, low sodium, low phosphorous     Health Coping   How would you rate  your overall health? Fair     Psychosocial Assessment   Patient Belief/Attitude about Diabetes Motivated to manage diabetes     Pre-Education Assessment   Patient understands the diabetes disease and treatment process. Demonstrates understanding / competency   Patient understands incorporating nutritional management into lifestyle. Needs Review   Patient undertands incorporating physical activity into lifestyle. Demonstrates understanding / competency   Patient understands using medications safely. Demonstrates understanding / competency   Patient understands monitoring blood glucose, interpreting and using results Demonstrates understanding / competency   Patient understands prevention, detection, and treatment of acute complications. Demonstrates understanding / competency   Patient understands prevention, detection, and treatment of chronic complications. Demonstrates understanding / competency   Patient understands how to develop strategies to address psychosocial issues. Needs Review   Patient understands how to develop strategies to promote health/change behavior. Needs Review     Complications   How often do you check your blood sugar? (P)  > 4 times/day   Number of hypoglycemic episodes per month (P)  2     Subsequent Visit   Since your last visit have you continued or begun to take your medications  as prescribed? Yes   Since your last visit, are you checking your blood glucose at least once a day? Yes      Learning Objective:  Patient will have a greater understanding of diabetes self-management. Patient education plan is to attend individual and/or group sessions per assessed needs and concerns.   Plan:   Patient Instructions  Continue the low sodium diet. Consider options discussed. See the [place] website    Expected Outcomes:   Patient demonstrates interest in learning.  Positive outcomes expected.  Education material provided: Kidney resource sheet  If problems  or questions, patient to contact team via:  Phone  Future DSME appointment: -  follow up in 1 month

## 2017-06-05 NOTE — Patient Instructions (Signed)
Continue the low sodium diet. Consider options discussed. See the [facility] website

## 2017-06-06 LAB — FRUCTOSAMINE: Fructosamine: 318 umol/L — ABNORMAL HIGH (ref 0–285)

## 2017-06-11 DIAGNOSIS — D631 Anemia in chronic kidney disease: Secondary | ICD-10-CM | POA: Diagnosis not present

## 2017-06-11 DIAGNOSIS — N184 Chronic kidney disease, stage 4 (severe): Secondary | ICD-10-CM | POA: Diagnosis not present

## 2017-06-11 DIAGNOSIS — I10 Essential (primary) hypertension: Secondary | ICD-10-CM | POA: Diagnosis not present

## 2017-06-12 ENCOUNTER — Telehealth: Payer: Self-pay | Admitting: Cardiovascular Disease

## 2017-06-12 NOTE — Telephone Encounter (Signed)
Received records from [facility] for appointment on 06/20/17 with Dr Claiborne Billings.  Records put with Dr Evette Georges schedule for 06/20/17. lp

## 2017-06-18 ENCOUNTER — Telehealth: Payer: Self-pay | Admitting: Cardiovascular Disease

## 2017-06-18 ENCOUNTER — Encounter (HOSPITAL_BASED_OUTPATIENT_CLINIC_OR_DEPARTMENT_OTHER): Payer: Self-pay | Admitting: *Deleted

## 2017-06-18 ENCOUNTER — Emergency Department (HOSPITAL_BASED_OUTPATIENT_CLINIC_OR_DEPARTMENT_OTHER)
Admission: EM | Admit: 2017-06-18 | Discharge: 2017-06-19 | Disposition: A | Discharge status: Home / Self Care | Payer: 59 | Attending: Emergency Medicine | Admitting: Emergency Medicine

## 2017-06-18 ENCOUNTER — Emergency Department (HOSPITAL_BASED_OUTPATIENT_CLINIC_OR_DEPARTMENT_OTHER): Payer: 59

## 2017-06-18 DIAGNOSIS — Z794 Long term (current) use of insulin: Secondary | ICD-10-CM | POA: Diagnosis not present

## 2017-06-18 DIAGNOSIS — I120 Hypertensive chronic kidney disease with stage 5 chronic kidney disease or end stage renal disease: Secondary | ICD-10-CM | POA: Insufficient documentation

## 2017-06-18 DIAGNOSIS — M79604 Pain in right leg: Secondary | ICD-10-CM | POA: Diagnosis not present

## 2017-06-18 DIAGNOSIS — Z79899 Other long term (current) drug therapy: Secondary | ICD-10-CM | POA: Diagnosis not present

## 2017-06-18 DIAGNOSIS — N186 End stage renal disease: Secondary | ICD-10-CM | POA: Diagnosis not present

## 2017-06-18 DIAGNOSIS — Z7982 Long term (current) use of aspirin: Secondary | ICD-10-CM | POA: Insufficient documentation

## 2017-06-18 DIAGNOSIS — E1122 Type 2 diabetes mellitus with diabetic chronic kidney disease: Secondary | ICD-10-CM | POA: Diagnosis not present

## 2017-06-18 DIAGNOSIS — M79661 Pain in right lower leg: Secondary | ICD-10-CM | POA: Diagnosis not present

## 2017-06-18 NOTE — Telephone Encounter (Signed)
S/w pt he is going to be out of town and needs to reschedule appt new appt made in sept and put pt on wait list for earlier appt

## 2017-06-18 NOTE — ED Triage Notes (Signed)
Pt c/o right upper leg pain  X 5 days

## 2017-06-18 NOTE — ED Provider Notes (Addendum)
[facility] Provider Note: Georgena Spurling, MD, FACEP  CSN: [id number] MRN: [id number] ARRIVAL: 06/18/17 at 2258 ROOM: MH07/MH07   CHIEF COMPLAINT  Leg Pain   HISTORY OF PRESENT ILLNESS  [name] [name] is a 51 y.o. male whose had the gradual onset of pain in his right lower leg for the past several days. It acutely worsened today. The pain is located along the posteromedial aspect of the right leg from mid thigh down to about mid lower leg and is most pronounced in the hamstrings. Pain is exacerbated with movement or palpation. He has limited range of motion of the right knee due to pain. He denies inciting trauma. He rates his pain as a 6 out of 10.  Consultation with the [facility] state controlled substances database reveals the patient has received no opioid prescriptions in the past year.   Past Medical History:  Diagnosis Date  . Bacteremia   . Diabetes mellitus   . Hypertension   . Pneumonia 02/2012   Strep pneumoniae bilateral pneumonia complicated by bacteremia  . Renal disorder    End stage, not on dialysis    Past Surgical History:  Procedure Laterality Date  . CHEST TUBE INSERTION     placed during hospitalization 02/2012  . COLONOSCOPY  04/01/2012   Procedure: COLONOSCOPY;  Surgeon: Juanita Craver, MD;  Location: [facility] ENDOSCOPY;  Service: Endoscopy;  Laterality: N/A;  . ESOPHAGOGASTRODUODENOSCOPY  03/31/2012   Procedure: ESOPHAGOGASTRODUODENOSCOPY (EGD);  Surgeon: Beryle Beams, MD;  Location: [facility] ENDOSCOPY;  Service: Endoscopy;  Laterality: N/A;    Family History  Problem Relation Age of Onset  . Diabetes Maternal Grandmother     Social History  Substance Use Topics  . Smoking status: Never Smoker  . Smokeless tobacco: Never Used  . Alcohol use Yes     Comment: rare     Prior to Admission medications   Medication Sig Start Date End Date Taking? Authorizing Provider  amLODipine (NORVASC) 10 MG tablet Take 1 tablet by mouth daily. 03/21/17    [provider]  aspirin 81 MG tablet Take 81 mg by mouth every morning.     [provider]  B Complex-C-Folic Acid (RENAL VITAMIN PO) Take by mouth.    [provider]  carvedilol (COREG) 25 MG tablet Take 1.5 tablets (37.5 mg total) by mouth 2 (two) times daily with a meal. 03/25/17   Troy Sine, MD  Continuous Blood Gluc Receiver (FREESTYLE LIBRE READER) DEVI 1 Device by Does not apply route as directed. 03/31/17   Elayne Snare, MD  Continuous Blood Gluc Sensor (FREESTYLE LIBRE SENSOR SYSTEM) MISC Apply sensor to upper arm and change sensor every 10 days. 06/05/17   Elayne Snare, MD  furosemide (LASIX) 80 MG tablet Take 1 tablet by mouth 2 (two) times daily.  03/15/17   [provider]  insulin aspart (NOVOLOG FLEXPEN) 100 UNIT/ML FlexPen 5-15 units before meals 04/08/17   Elayne Snare, MD  insulin glargine (LANTUS) 100 UNIT/ML injection Inject 40 Units into the skin at bedtime.    [provider]  Insulin Pen Needle 31G X 5 MM MISC Use to inject [place] once daily 03/28/17   Elayne Snare, MD  insulin regular (NOVOLIN R,HUMULIN R) 100 units/mL injection Inject 11 Units into the skin 3 (three) times daily before meals. Sliding scale up to 15 addt'l units as needed for CBG levels    [provider]  TRESIBA FLEXTOUCH 100 UNIT/ML SOPN FlexTouch Pen INJECT 0.38  MLS (38 UNITS TOTAL) INTO THE SKIN DAILY AT 10 PM. 05/16/17   Elayne Snare, MD  Vitamin D, Ergocalciferol, (DRISDOL) 50000 units CAPS capsule Take 50,000 Units by mouth every 7 (seven) days.    [provider]    Allergies Penicillins   REVIEW OF SYSTEMS  Negative except as noted here or in the History of Present Illness.   PHYSICAL EXAMINATION  Initial Vital Signs Blood pressure 134/78, pulse 74, temperature 98.7 F (37.1 C), temperature source Oral, resp. rate 16, height 6\' 2"  (1.88 m), weight 90.7 kg (200 lb), SpO2 100 %.  Examination General: Well-developed, well-nourished  male in no acute distress; appearance consistent with age of record HENT: normocephalic; atraumatic Eyes: pupils equal, round and reactive to light; extraocular muscles intact Neck: supple Heart: regular rate and rhythm Lungs: clear to auscultation bilaterally Abdomen: soft; nondistended; nontenderbowel sounds present Extremities: No deformity; pulses normal; no edema; tenderness along right posterior medial leg from mid thigh to mid lower leg, most pronounced over the hamstrings; decreased range of motion of right knee due to pain Neurologic: Awake, alert and oriented; motor function intact in all extremities and symmetric; no facial droop Skin: Warm and dry Psychiatric: Normal mood and affect   RESULTS  Summary of this visit's results, reviewed by myself:   EKG Interpretation  Date/Time:    Ventricular Rate:    PR Interval:    QRS Duration:   QT Interval:    QTC Calculation:   R Axis:     Text Interpretation:        Laboratory Studies: No results found for this or any previous visit (from the past 24 hour(s)). Imaging Studies: US Venous Img Lower Unilateral Right  Result Date: 06/19/2017 CLINICAL DATA:  Right lower extremity pain for 5 days. EXAM: Right LOWER EXTREMITY VENOUS DOPPLER ULTRASOUND TECHNIQUE: Gray-scale sonography with graded compression, as well as color Doppler and duplex ultrasound were performed to evaluate the lower extremity deep venous systems from the level of the common femoral vein and including the common femoral, femoral, profunda femoral, popliteal and calf veins including the posterior tibial, peroneal and gastrocnemius veins when visible. The superficial great saphenous vein was also interrogated. Spectral Doppler was utilized to evaluate flow at rest and with distal augmentation maneuvers in the common femoral, femoral and popliteal veins. COMPARISON:  None. FINDINGS: Contralateral Common Femoral Vein: Respiratory phasicity is normal and symmetric with  the symptomatic side. No evidence of thrombus. Normal compressibility. Common Femoral Vein: No evidence of thrombus. Normal compressibility, respiratory phasicity and response to augmentation. Saphenofemoral Junction: No evidence of thrombus. Normal compressibility and flow on color Doppler imaging. Profunda Femoral Vein: No evidence of thrombus. Normal compressibility and flow on color Doppler imaging. Femoral Vein: No evidence of thrombus. Normal compressibility, respiratory phasicity and response to augmentation. Popliteal Vein: No evidence of thrombus. Normal compressibility, respiratory phasicity and response to augmentation. Calf Veins: No evidence of thrombus. Normal compressibility and flow on color Doppler imaging. Superficial Great Saphenous Vein: No evidence of thrombus. Normal compressibility and flow on color Doppler imaging. Venous Reflux:  None. Other Findings:  None. IMPRESSION: No evidence of DVT within the right lower extremity. Electronically Signed   By: Andreas Newport M.D.   On: 06/19/2017 01:02    ED COURSE  Nursing notes and initial vitals signs, including pulse oximetry, reviewed.  Vitals:   06/18/17 2304 06/18/17 2306  BP:  134/78  Pulse:  74  Resp:  16  Temp:  98.7 F (37.1 C)  TempSrc:  Oral  SpO2:  100%  Weight: 90.7 kg (200 lb)   Height: 6\' 2"  (1.88 m)    1:15 AM Suspect tendinitis. Will avoid NSAIDs due to patient's end-stage renal disease. We'll splint and follow-up with sports medicine.  PROCEDURES    ED DIAGNOSES     ICD-10-CM   1. Right leg pain M79.604        Shanon Rosser, MD 06/19/17 0118    Shanon Rosser, MD 06/19/17 2297

## 2017-06-18 NOTE — Telephone Encounter (Signed)
New Message  Pt call to cancel and reschedule appt. Pt states he would like to sooner appt than next available with ONLY Dr. Claiborne Billings. Please call back to discuss

## 2017-06-19 DIAGNOSIS — M79661 Pain in right lower leg: Secondary | ICD-10-CM | POA: Diagnosis not present

## 2017-06-19 MED ORDER — HYDROCODONE-ACETAMINOPHEN 5-325 MG PO TABS: 1.0000 | ORAL_TABLET | ORAL | 0 refills | Status: DC | PRN

## 2017-06-19 NOTE — ED Notes (Signed)
ED Provider at bedside. 

## 2017-06-19 NOTE — ED Notes (Signed)
Pt verbalizes understanding of d/c instructions and denies any further needs at this time. 

## 2017-06-20 ENCOUNTER — Ambulatory Visit: Payer: 59 | Admitting: Cardiovascular Disease

## 2017-06-23 ENCOUNTER — Ambulatory Visit: Payer: 59 | Admitting: Dietician

## 2017-06-23 ENCOUNTER — Ambulatory Visit: Payer: 59 | Admitting: Endocrinology

## 2017-06-27 ENCOUNTER — Encounter (HOSPITAL_COMMUNITY)
Admission: RE | Admit: 2017-06-27 | Discharge: 2017-06-27 | Disposition: A | Discharge status: Home / Self Care | Payer: 59 | Source: Ambulatory Visit | Attending: Nephrology | Admitting: Nephrology

## 2017-06-27 DIAGNOSIS — D631 Anemia in chronic kidney disease: Secondary | ICD-10-CM | POA: Diagnosis not present

## 2017-06-27 DIAGNOSIS — N184 Chronic kidney disease, stage 4 (severe): Secondary | ICD-10-CM | POA: Diagnosis not present

## 2017-06-27 DIAGNOSIS — D649 Anemia, unspecified: Secondary | ICD-10-CM

## 2017-06-27 LAB — RENAL FUNCTION PANEL
Albumin: 3.1 g/dL — ABNORMAL LOW (ref 3.5–5.0)
Anion gap: 9 (ref 5–15)
BUN: 71 mg/dL — ABNORMAL HIGH (ref 6–20)
CO2: 27 mmol/L (ref 22–32)
Calcium: 7.6 mg/dL — ABNORMAL LOW (ref 8.9–10.3)
Chloride: 101 mmol/L (ref 101–111)
Creatinine, Ser: 4.85 mg/dL — ABNORMAL HIGH (ref 0.61–1.24)
GFR calc Af Amer: 15 mL/min — ABNORMAL LOW (ref >60)
GFR calc non Af Amer: 13 mL/min — ABNORMAL LOW (ref >60)
Glucose, Bld: 127 mg/dL — ABNORMAL HIGH (ref 65–99)
Phosphorus: 5.8 mg/dL — ABNORMAL HIGH (ref 2.5–4.6)
Potassium: 4.6 mmol/L (ref 3.5–5.1)
Sodium: 137 mmol/L (ref 135–145)

## 2017-06-27 LAB — POCT HEMOGLOBIN-HEMACUE: Hemoglobin: 9.6 g/dL — ABNORMAL LOW (ref 13.0–17.0)

## 2017-06-27 MED ORDER — DARBEPOETIN ALFA 150 MCG/0.3ML IJ SOSY
150.0000 ug | PREFILLED_SYRINGE | INTRAMUSCULAR | Status: DC
  Administered 2017-06-27: 150 ug via SUBCUTANEOUS

## 2017-06-27 MED ORDER — SODIUM CHLORIDE 0.9 % IV SOLN
510.0000 mg | INTRAVENOUS | Status: DC
  Administered 2017-06-27: 510 mg via INTRAVENOUS
  Filled 2017-06-27: qty 17

## 2017-06-27 MED ORDER — DARBEPOETIN ALFA 150 MCG/0.3ML IJ SOSY
PREFILLED_SYRINGE | INTRAMUSCULAR | Status: AC
  Administered 2017-06-27: 150 ug via SUBCUTANEOUS
  Filled 2017-06-27: qty 0.3

## 2017-07-28 ENCOUNTER — Encounter (HOSPITAL_COMMUNITY)
Admission: RE | Admit: 2017-07-28 | Discharge: 2017-07-28 | Disposition: A | Discharge status: Home / Self Care | Payer: 59 | Source: Ambulatory Visit | Attending: Nephrology | Admitting: Nephrology

## 2017-07-28 DIAGNOSIS — Z5181 Encounter for therapeutic drug level monitoring: Secondary | ICD-10-CM | POA: Insufficient documentation

## 2017-07-28 DIAGNOSIS — D631 Anemia in chronic kidney disease: Secondary | ICD-10-CM | POA: Diagnosis not present

## 2017-07-28 DIAGNOSIS — Z79899 Other long term (current) drug therapy: Secondary | ICD-10-CM | POA: Diagnosis not present

## 2017-07-28 DIAGNOSIS — N184 Chronic kidney disease, stage 4 (severe): Secondary | ICD-10-CM | POA: Insufficient documentation

## 2017-07-28 DIAGNOSIS — D649 Anemia, unspecified: Secondary | ICD-10-CM

## 2017-07-28 LAB — RENAL FUNCTION PANEL
Albumin: 3.3 g/dL — ABNORMAL LOW (ref 3.5–5.0)
Anion gap: 11 (ref 5–15)
BUN: 78 mg/dL — ABNORMAL HIGH (ref 6–20)
CO2: 27 mmol/L (ref 22–32)
Calcium: 8 mg/dL — ABNORMAL LOW (ref 8.9–10.3)
Chloride: 96 mmol/L — ABNORMAL LOW (ref 101–111)
Creatinine, Ser: 5.17 mg/dL — ABNORMAL HIGH (ref 0.61–1.24)
GFR calc Af Amer: 14 mL/min — ABNORMAL LOW (ref >60)
GFR calc non Af Amer: 12 mL/min — ABNORMAL LOW (ref >60)
Glucose, Bld: 250 mg/dL — ABNORMAL HIGH (ref 65–99)
Phosphorus: 5.2 mg/dL — ABNORMAL HIGH (ref 2.5–4.6)
Potassium: 5.2 mmol/L — ABNORMAL HIGH (ref 3.5–5.1)
Sodium: 134 mmol/L — ABNORMAL LOW (ref 135–145)

## 2017-07-28 LAB — IRON AND TIBC
Iron: 62 ug/dL (ref 45–182)
Saturation Ratios: 26 % (ref 17.9–39.5)
TIBC: 241 ug/dL — ABNORMAL LOW (ref 250–450)
UIBC: 179 ug/dL

## 2017-07-28 LAB — POCT HEMOGLOBIN-HEMACUE: Hemoglobin: 11.3 g/dL — ABNORMAL LOW (ref 13.0–17.0)

## 2017-07-28 LAB — FERRITIN: Ferritin: 807 ng/mL — ABNORMAL HIGH (ref 24–336)

## 2017-07-28 MED ORDER — DARBEPOETIN ALFA 150 MCG/0.3ML IJ SOSY
150.0000 ug | PREFILLED_SYRINGE | INTRAMUSCULAR | Status: DC
  Administered 2017-07-28: 150 ug via SUBCUTANEOUS

## 2017-07-28 MED ORDER — DARBEPOETIN ALFA 150 MCG/0.3ML IJ SOSY
PREFILLED_SYRINGE | INTRAMUSCULAR | Status: AC
  Filled 2017-07-28: qty 0.3

## 2017-07-29 LAB — PTH, INTACT AND CALCIUM
Calcium, Total (PTH): 8 mg/dL — ABNORMAL LOW (ref 8.7–10.2)
PTH: 106 pg/mL — ABNORMAL HIGH (ref 15–65)

## 2017-08-21 DIAGNOSIS — I872 Venous insufficiency (chronic) (peripheral): Secondary | ICD-10-CM | POA: Diagnosis not present

## 2017-08-26 ENCOUNTER — Inpatient Hospital Stay (HOSPITAL_COMMUNITY): Admission: RE | Admit: 2017-08-26 | Payer: 59 | Source: Ambulatory Visit

## 2017-08-26 DIAGNOSIS — N186 End stage renal disease: Secondary | ICD-10-CM | POA: Diagnosis not present

## 2017-08-26 DIAGNOSIS — Z01818 Encounter for other preprocedural examination: Secondary | ICD-10-CM | POA: Diagnosis not present

## 2017-08-26 DIAGNOSIS — Z01810 Encounter for preprocedural cardiovascular examination: Secondary | ICD-10-CM | POA: Diagnosis not present

## 2017-08-28 ENCOUNTER — Ambulatory Visit: Payer: 59 | Admitting: Urgent Care

## 2017-08-28 ENCOUNTER — Ambulatory Visit (HOSPITAL_COMMUNITY)
Admission: EM | Admit: 2017-08-28 | Discharge: 2017-08-28 | Disposition: A | Discharge status: Home / Self Care | Payer: 59 | Attending: Emergency Medicine | Admitting: Emergency Medicine

## 2017-08-28 ENCOUNTER — Encounter (HOSPITAL_COMMUNITY)
Admission: RE | Admit: 2017-08-28 | Discharge: 2017-08-28 | Disposition: A | Discharge status: Home / Self Care | Payer: 59 | Source: Ambulatory Visit | Attending: Nephrology | Admitting: Nephrology

## 2017-08-28 ENCOUNTER — Encounter (HOSPITAL_COMMUNITY): Payer: Self-pay | Admitting: Emergency Medicine

## 2017-08-28 DIAGNOSIS — W231XXA Caught, crushed, jammed, or pinched between stationary objects, initial encounter: Secondary | ICD-10-CM | POA: Diagnosis not present

## 2017-08-28 DIAGNOSIS — N184 Chronic kidney disease, stage 4 (severe): Secondary | ICD-10-CM | POA: Insufficient documentation

## 2017-08-28 DIAGNOSIS — S60942A Unspecified superficial injury of right middle finger, initial encounter: Secondary | ICD-10-CM | POA: Diagnosis not present

## 2017-08-28 DIAGNOSIS — Z79899 Other long term (current) drug therapy: Secondary | ICD-10-CM | POA: Insufficient documentation

## 2017-08-28 DIAGNOSIS — Z5181 Encounter for therapeutic drug level monitoring: Secondary | ICD-10-CM | POA: Diagnosis not present

## 2017-08-28 DIAGNOSIS — D649 Anemia, unspecified: Secondary | ICD-10-CM

## 2017-08-28 DIAGNOSIS — D631 Anemia in chronic kidney disease: Secondary | ICD-10-CM | POA: Diagnosis not present

## 2017-08-28 DIAGNOSIS — T1490XA Injury, unspecified, initial encounter: Secondary | ICD-10-CM

## 2017-08-28 DIAGNOSIS — L731 Pseudofolliculitis barbae: Secondary | ICD-10-CM

## 2017-08-28 LAB — RENAL FUNCTION PANEL
Albumin: 3.3 g/dL — ABNORMAL LOW (ref 3.5–5.0)
Anion gap: 13 (ref 5–15)
BUN: 73 mg/dL — ABNORMAL HIGH (ref 6–20)
CO2: 29 mmol/L (ref 22–32)
Calcium: 7.2 mg/dL — ABNORMAL LOW (ref 8.9–10.3)
Chloride: 97 mmol/L — ABNORMAL LOW (ref 101–111)
Creatinine, Ser: 5.17 mg/dL — ABNORMAL HIGH (ref 0.61–1.24)
GFR calc Af Amer: 14 mL/min — ABNORMAL LOW (ref >60)
GFR calc non Af Amer: 12 mL/min — ABNORMAL LOW (ref >60)
Glucose, Bld: 275 mg/dL — ABNORMAL HIGH (ref 65–99)
Phosphorus: 6.7 mg/dL — ABNORMAL HIGH (ref 2.5–4.6)
Potassium: 4.1 mmol/L (ref 3.5–5.1)
Sodium: 139 mmol/L (ref 135–145)

## 2017-08-28 LAB — IRON AND TIBC
Iron: 59 ug/dL (ref 45–182)
Saturation Ratios: 24 % (ref 17.9–39.5)
TIBC: 248 ug/dL — ABNORMAL LOW (ref 250–450)
UIBC: 189 ug/dL

## 2017-08-28 LAB — FERRITIN: Ferritin: 766 ng/mL — ABNORMAL HIGH (ref 24–336)

## 2017-08-28 LAB — POCT HEMOGLOBIN-HEMACUE: Hemoglobin: 11.9 g/dL — ABNORMAL LOW (ref 13.0–17.0)

## 2017-08-28 MED ORDER — DARBEPOETIN ALFA 150 MCG/0.3ML IJ SOSY
150.0000 ug | PREFILLED_SYRINGE | INTRAMUSCULAR | Status: DC
  Administered 2017-08-28: 150 ug via SUBCUTANEOUS

## 2017-08-28 MED ORDER — CEPHALEXIN 500 MG PO CAPS
500.0000 mg | ORAL_CAPSULE | Freq: Four times a day (QID) | ORAL | 0 refills | Status: DC
Start: 2017-08-28 — End: 2018-01-24

## 2017-08-28 MED ORDER — DARBEPOETIN ALFA 150 MCG/0.3ML IJ SOSY
PREFILLED_SYRINGE | INTRAMUSCULAR | Status: AC
  Filled 2017-08-28: qty 0.3

## 2017-08-28 NOTE — Discharge Instructions (Signed)
Recommend leaving the chest lesion alone for a while. It may take a few weeks to go away. If it looks like is getting infected or becoming painful, draining or changing in any other way seek medical care. Protect your finger. Try to keep the blister intact. Watch for any signs of infection. You are given antibiotics prophylactically for 2 day treatment. Return if needed

## 2017-08-28 NOTE — ED Triage Notes (Signed)
Patient says he has an ingrown chest hair on left chest .  Patient reports shutting right middle finger in the car door yesterday.  Visible large bruise to pad of finger.  Patient is a diabetic

## 2017-08-28 NOTE — ED Provider Notes (Addendum)
[place]    CSN: [id number] Arrival date & time: 08/28/17  1000     History   Chief Complaint Chief Complaint  Patient presents with  . Abscess    HPI [name] [name] is a 51 y.o. male.   51 year old male with 2 complaints. He noticed earlier this week that there was a nodule-type lesion to the skin of his right anterior chest. He believed since there was a hair growing through with it may be an ingrown hair. He has not noticed any drainage or bleeding or redness. He states is a little sore but he has been scratching at it.  Yesterday he slammed the car door against the right long finger pulp. This was not a crushing injury. It was a pinching type of injury and he did not break the skin. As a result a vesicle formed to the pad of the finger. Patient states that there was no other skin injury or opening into the skin. Denies tenderness to the extensor surface or lateral medial surfaces of the finger. Full range of motion.      Past Medical History:  Diagnosis Date  . Bacteremia   . Diabetes mellitus   . Hypertension   . Pneumonia 02/2012   Strep pneumoniae bilateral pneumonia complicated by bacteremia  . Renal disorder    End stage, not on dialysis    Patient Active Problem List   Diagnosis Date Noted  . AKI (acute kidney injury) ([place]) 01/29/2017  . Palpitations 01/13/2017  . Essential hypertension, benign 10/26/2013  . Dyspnea 06/09/2012  . Dizziness 06/09/2012  . Pneumonia, organism unspecified([place]) 04/07/2012  . Pleural effusion 04/07/2012  . Parapneumonic effusion 03/28/2012  . Symptomatic anemia 03/28/2012  . Hypoalbuminemia 03/28/2012  . Total bilirubin, elevated 03/28/2012  . Bilateral leg edema 03/23/2012  . Cyst of skin 03/23/2012  . Bacteremia due to Streptococcus pneumoniae 03/15/2012  . Hyponatremia 03/15/2012  . Acute kidney failure 03/13/2012  . Elevated LFTs 03/13/2012  . Pneumococcal pneumonia ([place]) 03/12/2012  . Diabetes  mellitus ([place]) 03/12/2012    Past Surgical History:  Procedure Laterality Date  . CHEST TUBE INSERTION     placed during hospitalization 02/2012  . COLONOSCOPY  04/01/2012   Procedure: COLONOSCOPY;  Surgeon: Juanita Craver, MD;  Location: [facility] ENDOSCOPY;  Service: Endoscopy;  Laterality: N/A;  . ESOPHAGOGASTRODUODENOSCOPY  03/31/2012   Procedure: ESOPHAGOGASTRODUODENOSCOPY (EGD);  Surgeon: Beryle Beams, MD;  Location: [facility] ENDOSCOPY;  Service: Endoscopy;  Laterality: N/A;       Home Medications    Prior to Admission medications   Medication Sig Start Date End Date Taking? Authorizing Provider  amLODipine (NORVASC) 10 MG tablet Take 1 tablet by mouth daily. 03/21/17   [provider]  aspirin 81 MG tablet Take 81 mg by mouth every morning.     [provider]  B Complex-C-Folic Acid (RENAL VITAMIN PO) Take by mouth.    [provider]  carvedilol (COREG) 25 MG tablet Take 1.5 tablets (37.5 mg total) by mouth 2 (two) times daily with a meal. 03/25/17   Troy Sine, MD  cephALEXin (KEFLEX) 500 MG capsule Take 1 capsule (500 mg total) by mouth 4 (four) times daily. 08/28/17   Janne Napoleon, NP  Continuous Blood Gluc Receiver (FREESTYLE LIBRE READER) DEVI 1 Device by Does not apply route as directed. 03/31/17   Elayne Snare, MD  Continuous Blood Gluc Sensor (FREESTYLE LIBRE SENSOR SYSTEM) MISC Apply sensor to upper arm and change sensor every 10  days. 06/05/17   Elayne Snare, MD  furosemide (LASIX) 80 MG tablet Take 1 tablet by mouth 2 (two) times daily.  03/15/17   [provider]  HYDROcodone-acetaminophen (NORCO) 5-325 MG tablet Take 1 tablet by mouth every 4 (four) hours as needed (for pain). 06/19/17   Molpus, John, MD  insulin aspart (NOVOLOG FLEXPEN) 100 UNIT/ML FlexPen 5-15 units before meals 04/08/17   Elayne Snare, MD  insulin glargine (LANTUS) 100 UNIT/ML injection Inject 40 Units into the skin at bedtime.    [provider]  Insulin Pen Needle 31G X 5 MM  MISC Use to inject [place] once daily 03/28/17   Elayne Snare, MD  insulin regular (NOVOLIN R,HUMULIN R) 100 units/mL injection Inject 11 Units into the skin 3 (three) times daily before meals. Sliding scale up to 15 addt'l units as needed for CBG levels    [provider]  TRESIBA FLEXTOUCH 100 UNIT/ML SOPN FlexTouch Pen INJECT 0.38 MLS (38 UNITS TOTAL) INTO THE SKIN DAILY AT 10 PM. 05/16/17   Elayne Snare, MD  Vitamin D, Ergocalciferol, (DRISDOL) 50000 units CAPS capsule Take 50,000 Units by mouth every 7 (seven) days.    [provider]    Family History Family History  Problem Relation Age of Onset  . Diabetes Maternal Grandmother     Social History Social History  Substance Use Topics  . Smoking status: Never Smoker  . Smokeless tobacco: Never Used  . Alcohol use Yes     Comment: rare      Allergies   Penicillins discussion with patient reveals that he was in the hospital recently for pneumonia and was treated with penicillins and other antibiotics and had no reaction and doubts he has a true allergy to penicillin.   Review of Systems Review of Systems  Constitutional: Negative.   HENT: Negative.   Respiratory: Negative.   Skin:       As per history of present illness  Neurological: Negative.   All other systems reviewed and are negative.    Physical Exam Triage Vital Signs ED Triage Vitals  Enc Vitals Group     BP 08/28/17 1016 132/77     Pulse Rate 08/28/17 1016 66     Resp 08/28/17 1016 18     Temp 08/28/17 1016 97.7 F (36.5 C)     Temp Source 08/28/17 1016 Oral     SpO2 08/28/17 1016 100 %     Weight --      Height --      Head Circumference --      Peak Flow --      Pain Score 08/28/17 1014 1     Pain Loc --      Pain Edu? --      Excl. in [place]? --    No data found.   Updated Vital Signs BP 132/77 (BP Location: Left Arm)   Pulse 66   Temp 97.7 F (36.5 C) (Oral)   Resp 18   SpO2 100%   Visual Acuity Right Eye Distance:     Left Eye Distance:   Bilateral Distance:    Right Eye Near:   Left Eye Near:    Bilateral Near:     Physical Exam  Constitutional: He is oriented to person, place, and time. He appears well-developed and well-nourished. No distress.  Eyes: EOM are normal.  Neck: Normal range of motion. Neck supple.  Cardiovascular: Normal rate.   Pulmonary/Chest: Effort normal. No respiratory distress.  Musculoskeletal:  He exhibits no edema.  Neurological: He is alert and oriented to person, place, and time. He exhibits normal muscle tone.  Skin: Skin is warm and dry.  There is a 4 mm firm annular nodule to the right anterior chest there is a hair growing through it. There is no bleeding or signs of infection. It is not fluctuant. No surrounding erythema or swelling. He did want to know if there was pus in it so with his permission the lesion was scrubbed with alcohol and a 27-gauge needle was inserted and there was no purulent material just 1 small drop of blood.  There is a vesicle to the pad of the right long finger. No evidence of infection at this time no circumferential swelling. No bleeding.  Psychiatric: He has a normal mood and affect.  Nursing note and vitals reviewed.    UC Treatments / Results  Labs (all labs ordered are listed, but only abnormal results are displayed) Labs Reviewed - No data to display  EKG  EKG Interpretation None       Radiology No results found.  Procedures Procedures (including critical care time)  Medications Ordered in UC Medications - No data to display   Initial Impression / Assessment and Plan / UC Course  I have reviewed the triage vital signs and the nursing notes.  Pertinent labs & imaging results that were available during my care of the patient were reviewed by me and considered in my medical decision making (see chart for details).     Recommend leaving the chest lesion alone for a while. It may take a few weeks to go away. If it  looks like is getting infected or becoming painful, draining or changing in any other way seek medical care. Protect your finger. Try to keep the blister intact. Watch for any signs of infection. You are given antibiotics prophylactically for 2 day treatment. Return if needed   Final Clinical Impressions(s) / UC Diagnoses   Final diagnoses:  Ingrown hair  Soft tissue injury    New Prescriptions New Prescriptions   CEPHALEXIN (KEFLEX) 500 MG CAPSULE    Take 1 capsule (500 mg total) by mouth 4 (four) times daily.     Controlled Substance Prescriptions [place] Controlled Substance Registry consulted? Not Applicable   Janne Napoleon, NP 08/28/17 1045    Janne Napoleon, NP 08/28/17 1046

## 2017-09-01 DIAGNOSIS — I10 Essential (primary) hypertension: Secondary | ICD-10-CM | POA: Diagnosis not present

## 2017-09-01 DIAGNOSIS — N185 Chronic kidney disease, stage 5: Secondary | ICD-10-CM | POA: Diagnosis not present

## 2017-09-01 DIAGNOSIS — D631 Anemia in chronic kidney disease: Secondary | ICD-10-CM | POA: Diagnosis not present

## 2017-09-18 ENCOUNTER — Ambulatory Visit: Payer: 59 | Admitting: Cardiovascular Disease

## 2017-10-03 ENCOUNTER — Encounter (HOSPITAL_COMMUNITY)
Admission: RE | Admit: 2017-10-03 | Discharge: 2017-10-03 | Disposition: A | Discharge status: Home / Self Care | Payer: 59 | Source: Ambulatory Visit | Attending: Nephrology | Admitting: Nephrology

## 2017-10-03 DIAGNOSIS — Z5181 Encounter for therapeutic drug level monitoring: Secondary | ICD-10-CM | POA: Insufficient documentation

## 2017-10-03 DIAGNOSIS — Z79899 Other long term (current) drug therapy: Secondary | ICD-10-CM | POA: Diagnosis not present

## 2017-10-03 DIAGNOSIS — D631 Anemia in chronic kidney disease: Secondary | ICD-10-CM | POA: Diagnosis not present

## 2017-10-03 DIAGNOSIS — N184 Chronic kidney disease, stage 4 (severe): Secondary | ICD-10-CM | POA: Insufficient documentation

## 2017-10-03 DIAGNOSIS — D649 Anemia, unspecified: Secondary | ICD-10-CM

## 2017-10-03 LAB — POCT HEMOGLOBIN-HEMACUE: Hemoglobin: 11.5 g/dL — ABNORMAL LOW (ref 13.0–17.0)

## 2017-10-03 LAB — FERRITIN: Ferritin: 737 ng/mL — ABNORMAL HIGH (ref 24–336)

## 2017-10-03 LAB — RENAL FUNCTION PANEL
Albumin: 3.1 g/dL — ABNORMAL LOW (ref 3.5–5.0)
Anion gap: 11 (ref 5–15)
BUN: 72 mg/dL — ABNORMAL HIGH (ref 6–20)
CO2: 27 mmol/L (ref 22–32)
Calcium: 8.2 mg/dL — ABNORMAL LOW (ref 8.9–10.3)
Chloride: 95 mmol/L — ABNORMAL LOW (ref 101–111)
Creatinine, Ser: 5.16 mg/dL — ABNORMAL HIGH (ref 0.61–1.24)
GFR calc Af Amer: 14 mL/min — ABNORMAL LOW (ref >60)
GFR calc non Af Amer: 12 mL/min — ABNORMAL LOW (ref >60)
Glucose, Bld: 328 mg/dL — ABNORMAL HIGH (ref 65–99)
Phosphorus: 5.7 mg/dL — ABNORMAL HIGH (ref 2.5–4.6)
Potassium: 4.9 mmol/L (ref 3.5–5.1)
Sodium: 133 mmol/L — ABNORMAL LOW (ref 135–145)

## 2017-10-03 LAB — IRON AND TIBC
Iron: 53 ug/dL (ref 45–182)
Saturation Ratios: 24 % (ref 17.9–39.5)
TIBC: 223 ug/dL — ABNORMAL LOW (ref 250–450)
UIBC: 170 ug/dL

## 2017-10-03 MED ORDER — DARBEPOETIN ALFA 100 MCG/0.5ML IJ SOSY
100.0000 ug | PREFILLED_SYRINGE | INTRAMUSCULAR | Status: DC
  Administered 2017-10-03: 100 ug via SUBCUTANEOUS

## 2017-10-03 MED ORDER — DARBEPOETIN ALFA 100 MCG/0.5ML IJ SOSY
PREFILLED_SYRINGE | INTRAMUSCULAR | Status: AC
  Administered 2017-10-03: 100 ug via SUBCUTANEOUS
  Filled 2017-10-03: qty 0.5

## 2017-10-07 DIAGNOSIS — R918 Other nonspecific abnormal finding of lung field: Secondary | ICD-10-CM | POA: Diagnosis not present

## 2017-10-07 DIAGNOSIS — Z01818 Encounter for other preprocedural examination: Secondary | ICD-10-CM | POA: Diagnosis not present

## 2017-10-07 DIAGNOSIS — N186 End stage renal disease: Secondary | ICD-10-CM | POA: Diagnosis not present

## 2017-10-07 DIAGNOSIS — I700 Atherosclerosis of aorta: Secondary | ICD-10-CM | POA: Diagnosis not present

## 2017-10-14 DIAGNOSIS — I10 Essential (primary) hypertension: Secondary | ICD-10-CM | POA: Diagnosis not present

## 2017-10-14 DIAGNOSIS — D631 Anemia in chronic kidney disease: Secondary | ICD-10-CM | POA: Diagnosis not present

## 2017-10-14 DIAGNOSIS — N185 Chronic kidney disease, stage 5: Secondary | ICD-10-CM | POA: Diagnosis not present

## 2017-10-31 ENCOUNTER — Encounter (HOSPITAL_COMMUNITY): Payer: 59

## 2017-11-03 ENCOUNTER — Encounter (HOSPITAL_COMMUNITY)
Admission: RE | Admit: 2017-11-03 | Discharge: 2017-11-03 | Disposition: A | Discharge status: Home / Self Care | Payer: 59 | Source: Ambulatory Visit | Attending: Nephrology | Admitting: Nephrology

## 2017-11-03 VITALS — BP 154/78 | HR 71 | Temp 98.2°F | Resp 18

## 2017-11-03 DIAGNOSIS — Z79899 Other long term (current) drug therapy: Secondary | ICD-10-CM | POA: Diagnosis not present

## 2017-11-03 DIAGNOSIS — D649 Anemia, unspecified: Secondary | ICD-10-CM

## 2017-11-03 DIAGNOSIS — Z5181 Encounter for therapeutic drug level monitoring: Secondary | ICD-10-CM | POA: Insufficient documentation

## 2017-11-03 DIAGNOSIS — D631 Anemia in chronic kidney disease: Secondary | ICD-10-CM | POA: Insufficient documentation

## 2017-11-03 DIAGNOSIS — N184 Chronic kidney disease, stage 4 (severe): Secondary | ICD-10-CM | POA: Insufficient documentation

## 2017-11-03 LAB — RENAL FUNCTION PANEL
Albumin: 3.3 g/dL — ABNORMAL LOW (ref 3.5–5.0)
Anion gap: 12 (ref 5–15)
BUN: 79 mg/dL — ABNORMAL HIGH (ref 6–20)
CO2: 27 mmol/L (ref 22–32)
Calcium: 7.3 mg/dL — ABNORMAL LOW (ref 8.9–10.3)
Chloride: 93 mmol/L — ABNORMAL LOW (ref 101–111)
Creatinine, Ser: 5.84 mg/dL — ABNORMAL HIGH (ref 0.61–1.24)
GFR calc Af Amer: 12 mL/min — ABNORMAL LOW (ref >60)
GFR calc non Af Amer: 10 mL/min — ABNORMAL LOW (ref >60)
Glucose, Bld: 374 mg/dL — ABNORMAL HIGH (ref 65–99)
Phosphorus: 5.6 mg/dL — ABNORMAL HIGH (ref 2.5–4.6)
Potassium: 4.2 mmol/L (ref 3.5–5.1)
Sodium: 132 mmol/L — ABNORMAL LOW (ref 135–145)

## 2017-11-03 LAB — IRON AND TIBC
Iron: 58 ug/dL (ref 45–182)
Saturation Ratios: 25 % (ref 17.9–39.5)
TIBC: 228 ug/dL — ABNORMAL LOW (ref 250–450)
UIBC: 170 ug/dL

## 2017-11-03 LAB — POCT HEMOGLOBIN-HEMACUE: Hemoglobin: 11.1 g/dL — ABNORMAL LOW (ref 13.0–17.0)

## 2017-11-03 LAB — FERRITIN: Ferritin: 975 ng/mL — ABNORMAL HIGH (ref 24–336)

## 2017-11-03 MED ORDER — DARBEPOETIN ALFA 100 MCG/0.5ML IJ SOSY
100.0000 ug | PREFILLED_SYRINGE | INTRAMUSCULAR | Status: DC
  Administered 2017-11-03: 100 ug via SUBCUTANEOUS

## 2017-11-03 MED ORDER — DARBEPOETIN ALFA 100 MCG/0.5ML IJ SOSY
PREFILLED_SYRINGE | INTRAMUSCULAR | Status: AC
  Filled 2017-11-03: qty 0.5

## 2017-11-04 LAB — PTH, INTACT AND CALCIUM
Calcium, Total (PTH): 7.5 mg/dL — ABNORMAL LOW (ref 8.7–10.2)
PTH: 107 pg/mL — ABNORMAL HIGH (ref 15–65)

## 2017-11-25 DIAGNOSIS — Z01818 Encounter for other preprocedural examination: Secondary | ICD-10-CM | POA: Diagnosis not present

## 2017-11-28 ENCOUNTER — Other Ambulatory Visit (HOSPITAL_COMMUNITY): Payer: Self-pay | Admitting: *Deleted

## 2017-12-01 ENCOUNTER — Encounter (HOSPITAL_COMMUNITY): Payer: 59

## 2017-12-04 ENCOUNTER — Other Ambulatory Visit (HOSPITAL_COMMUNITY): Payer: Self-pay | Admitting: *Deleted

## 2017-12-05 ENCOUNTER — Encounter (HOSPITAL_COMMUNITY)
Admission: RE | Admit: 2017-12-05 | Discharge: 2017-12-05 | Disposition: A | Discharge status: Home / Self Care | Payer: 59 | Source: Ambulatory Visit | Attending: Nephrology | Admitting: Nephrology

## 2017-12-05 ENCOUNTER — Encounter (HOSPITAL_COMMUNITY): Payer: 59

## 2017-12-05 VITALS — BP 155/74 | HR 71 | Temp 97.5°F | Resp 18

## 2017-12-05 DIAGNOSIS — Z79899 Other long term (current) drug therapy: Secondary | ICD-10-CM | POA: Insufficient documentation

## 2017-12-05 DIAGNOSIS — D631 Anemia in chronic kidney disease: Secondary | ICD-10-CM | POA: Insufficient documentation

## 2017-12-05 DIAGNOSIS — Z5181 Encounter for therapeutic drug level monitoring: Secondary | ICD-10-CM | POA: Insufficient documentation

## 2017-12-05 DIAGNOSIS — D649 Anemia, unspecified: Secondary | ICD-10-CM

## 2017-12-05 DIAGNOSIS — N184 Chronic kidney disease, stage 4 (severe): Secondary | ICD-10-CM | POA: Insufficient documentation

## 2017-12-05 LAB — POCT HEMOGLOBIN-HEMACUE: Hemoglobin: 10.9 g/dL — ABNORMAL LOW (ref 13.0–17.0)

## 2017-12-05 LAB — RENAL FUNCTION PANEL
Albumin: 3.6 g/dL (ref 3.5–5.0)
Anion gap: 10 (ref 5–15)
BUN: 78 mg/dL — ABNORMAL HIGH (ref 6–20)
CO2: 30 mmol/L (ref 22–32)
Calcium: 8.3 mg/dL — ABNORMAL LOW (ref 8.9–10.3)
Chloride: 97 mmol/L — ABNORMAL LOW (ref 101–111)
Creatinine, Ser: 5.8 mg/dL — ABNORMAL HIGH (ref 0.61–1.24)
GFR calc Af Amer: 12 mL/min — ABNORMAL LOW (ref >60)
GFR calc non Af Amer: 10 mL/min — ABNORMAL LOW (ref >60)
Glucose, Bld: 130 mg/dL — ABNORMAL HIGH (ref 65–99)
Phosphorus: 6.3 mg/dL — ABNORMAL HIGH (ref 2.5–4.6)
Potassium: 4.3 mmol/L (ref 3.5–5.1)
Sodium: 137 mmol/L (ref 135–145)

## 2017-12-05 LAB — IRON AND TIBC
Iron: 53 ug/dL (ref 45–182)
Saturation Ratios: 20 % (ref 17.9–39.5)
TIBC: 259 ug/dL (ref 250–450)
UIBC: 206 ug/dL

## 2017-12-05 LAB — FERRITIN: Ferritin: 947 ng/mL — ABNORMAL HIGH (ref 24–336)

## 2017-12-05 MED ORDER — DARBEPOETIN ALFA 100 MCG/0.5ML IJ SOSY
100.0000 ug | PREFILLED_SYRINGE | INTRAMUSCULAR | Status: DC
  Administered 2017-12-05: 100 ug via SUBCUTANEOUS

## 2017-12-10 ENCOUNTER — Ambulatory Visit: Payer: 59 | Admitting: Endocrinology

## 2017-12-18 ENCOUNTER — Encounter: Payer: 59 | Attending: Endocrinology | Admitting: Dietician

## 2017-12-18 ENCOUNTER — Other Ambulatory Visit: Payer: Self-pay

## 2017-12-18 DIAGNOSIS — Z794 Long term (current) use of insulin: Secondary | ICD-10-CM | POA: Diagnosis not present

## 2017-12-18 DIAGNOSIS — E1122 Type 2 diabetes mellitus with diabetic chronic kidney disease: Secondary | ICD-10-CM | POA: Insufficient documentation

## 2017-12-18 DIAGNOSIS — Z713 Dietary counseling and surveillance: Secondary | ICD-10-CM | POA: Insufficient documentation

## 2017-12-18 DIAGNOSIS — N185 Chronic kidney disease, stage 5: Secondary | ICD-10-CM | POA: Diagnosis not present

## 2017-12-18 MED ORDER — FREESTYLE LIBRE SENSOR SYSTEM MISC: 3 refills | Status: DC

## 2017-12-18 NOTE — Patient Instructions (Signed)
Add a third meal to improve your caloric intake. Be sure to drink adequate water. Take your Phos binder before meals/snacks Consider light weights if approved by your doctor.  No Database [email or web address]  Skin Tac (option to help the sensors stick better)

## 2017-12-19 NOTE — Progress Notes (Addendum)
Diabetes Self-Management Education  Visit Type: Follow-up  Appt. Start Time: 0910 Appt. End Time: 3151  12/19/2017  Mr. Shonte Administrator, identified by name and date of birth, is a 51 y.o. male with a diagnosis of Diabetes:  Type 2 Diabetes.  Other history includes HTN an stage 5 CKD.  His last GFR was 12 and is relatively stable 12/05/17.  Other labs include BUN 78, Creatinine 5.8, Potassium 4.3, Phosphorous 6.3.  Patient reports that he was told to take 2 tums with each meal for phosphorous binders.  He continues to have anemia.  He is currently looking for a kidney transplant. Weight today was 205 lbs I last saw him last 05/2017.  He changed his diet to avoid meat starting in August.  People at work noted that he had increase energy after this but more recently they comment and ask if is is well.  His weight is stable based on our records but patient noted weight loss.  He has not told work about his medical problems.  Medications include Tresiba 38 units daily and Novolog but requires this rarely.  Patient reports a trip to [place] this fall and changed his diet to fish and vegetables and grains.  He reports that in [place], he only needed the [place] a couple of times ant\d that his blood sugar was controlled.  He then required this after [place] despite eating similarly.  He has been told to take Tums (2) prior to each meal as a phosphorous binder.  He also takes vitamin D and a renal vitamin.  Patient lives alone.  He meal preps and had a friend show him how to prepare meals without meat.  His youngest daughter is in college.  He works 12 hour shifts nights at [employer] and since seeing him last has reduced to 2 meals plus snack daily.  Discussed need to increase his caloric intake and to add another meal at work.  He continues to follow a low protein, low sodium diet.  Protein needs are 60-70 grams per day.  Also today discussed the differences in nutrition for peritoneal dialysis  vs hemo dialysis.  He uses a [employer].  Discussed options to keeping this on as he has wasted a couple of sensors due to lack of sticking.  (Skin tac, simpatch, Rockadex)  ASSESSMENT  Weight 205 lb (93 kg). Body mass index is 26.32 kg/m.  Diabetes Self-Management Education - 12/19/17 1700      Visit Information   Visit Type  Follow-up      Psychosocial Assessment   Patient Belief/Attitude about Diabetes  Motivated to manage diabetes    Self-care barriers  None    Self-management support  Doctor's office;Friends    Other persons present  Patient    Patient Concerns  Nutrition/Meal planning;Other (comment) adequacy of nutrition and weight maintenance    Special Needs  None    Preferred Learning Style  No preference indicated    Learning Readiness  Ready    How often do you need to have someone help you when you read instructions, pamphlets, or other written materials from your doctor or pharmacy?  1 - Never      Pre-Education Assessment   Patient understands the diabetes disease and treatment process.  Demonstrates understanding / competency    Patient understands incorporating nutritional management into lifestyle.  Needs Review    Patient undertands incorporating physical activity into lifestyle.  Demonstrates understanding / competency    Patient understands using  medications safely.  Demonstrates understanding / competency    Patient understands monitoring blood glucose, interpreting and using results  Demonstrates understanding / competency    Patient understands prevention, detection, and treatment of acute complications.  Demonstrates understanding / competency    Patient understands prevention, detection, and treatment of chronic complications.  Demonstrates understanding / competency    Patient understands how to develop strategies to address psychosocial issues.  Needs Review    Patient understands how to develop strategies to promote health/change behavior.  Needs  Review      Complications   Last HgB A1C per patient/outside source  6.3 % 12/05/17    How often do you check your blood sugar?  > 4 times/day FreeStyle Libre    Fasting Blood glucose range (mg/dL)  70-129    Postprandial Blood glucose range (mg/dL)  130-179      Dietary Intake   Breakfast  2 egg whites, 2 slices cinnamon raisin toast, sugar free jelly or cream cheese, cream of wheat, small OJ    Snack (morning)  none    Lunch  coconut milk yogurt, fruit    Snack (afternoon)  none    Dinner  vegetables, fish, grain    Snack (evening)  none    Beverage(s)  water, occasional flavor packet, cranberry or OJ or watered down juice      Exercise   Exercise Type  Light (walking / raking leaves)      Patient Education   Previous Diabetes Education  Yes (please comment) 05/2017    Nutrition management   Other (comment) nutrition for related to kidney failure and diabetes    Monitoring  Other (comment) discussed Libre readings    Psychosocial adjustment  Identified and addressed patients feelings and concerns about diabetes    Personal strategies to promote health  Helped patient develop diabetes management plan for (enter comment) improving diet quantitiy      Individualized Goals (developed by patient)   Nutrition  Other (comment) plant based eating, adequate protein, phosphorous    Physical Activity  Exercise 3-5 times per week;30 minutes per day    Medications  take my medication as prescribed    Monitoring   test my blood glucose as discussed    Reducing Risk  examine blood glucose patterns;increase portions of healthy fats    Health Coping  discuss diabetes with (comment) MD/RD/CDE      Patient Self-Evaluation of Goals - Patient rates self as meeting previously set goals (% of time)   Nutrition  >75%    Physical Activity  50 - 75 %    Medications  >75%    Monitoring  >75%    Problem Solving  >75%    Reducing Risk  >75%    Health Coping  50 - 75 %      Post-Education Assessment    Patient understands the diabetes disease and treatment process.  Demonstrates understanding / competency    Patient understands incorporating nutritional management into lifestyle.  Needs Review    Patient undertands incorporating physical activity into lifestyle.  Demonstrates understanding / competency    Patient understands using medications safely.  Demonstrates understanding / competency    Patient understands monitoring blood glucose, interpreting and using results  Demonstrates understanding / competency    Patient understands prevention, detection, and treatment of acute complications.  Demonstrates understanding / competency    Patient understands prevention, detection, and treatment of chronic complications.  Demonstrates understanding / competency    Patient  understands how to develop strategies to address psychosocial issues.  Demonstrates understanding / competency    Patient understands how to develop strategies to promote health/change behavior.  Needs Review      Outcomes   Expected Outcomes  Demonstrated interest in learning. Expect positive outcomes    Future DMSE  PRN    Program Status  Completed      Subsequent Visit   Since your last visit have you continued or begun to take your medications as prescribed?  Yes    Since your last visit have you had your blood pressure checked?  Yes    Is your most recent blood pressure lower, unchanged, or higher since your last visit?  Unchanged    Since your last visit have you experienced any weight changes?  No change    Since your last visit, are you checking your blood glucose at least once a day?  Yes       Individualized Plan for Diabetes Self-Management Training:   Learning Objective:  Patient will have a greater understanding of diabetes self-management. Patient education plan is to attend individual and/or group sessions per assessed needs and concerns.   Plan:   Patient Instructions  Add a third meal to improve your  caloric intake. Be sure to drink adequate water. Take your Phos binder before meals/snacks Consider light weights if approved by your doctor.  No Database administrator..com  Skin Tac (option to help the sensors stick better)    Expected Outcomes:  Demonstrated interest in learning. Expect positive outcomes  Education material provided: none this visit  If problems or questions, patient to contact team via:  Phone  Future DSME appointment: PRN

## 2017-12-25 ENCOUNTER — Telehealth: Payer: Self-pay | Admitting: Endocrinology

## 2017-12-25 DIAGNOSIS — N185 Chronic kidney disease, stage 5: Secondary | ICD-10-CM | POA: Diagnosis not present

## 2017-12-25 DIAGNOSIS — D631 Anemia in chronic kidney disease: Secondary | ICD-10-CM | POA: Diagnosis not present

## 2017-12-25 DIAGNOSIS — I120 Hypertensive chronic kidney disease with stage 5 chronic kidney disease or end stage renal disease: Secondary | ICD-10-CM | POA: Diagnosis not present

## 2017-12-25 NOTE — Telephone Encounter (Signed)
Need refill refill of Continuous Blood Gluc Sensor ([place]) MISC [[id number]  send to  [facility], [place] [address] [phone] (Phone) [phone] (Fax)

## 2017-12-26 MED ORDER — FREESTYLE LIBRE SENSOR SYSTEM MISC: 3 refills | Status: DC

## 2017-12-26 NOTE — Telephone Encounter (Signed)
Refill submitted. 

## 2017-12-26 NOTE — Telephone Encounter (Signed)
Pt stated he has went to the pharmacy and script wasn't there  Please resend over    Continuous Blood Gluc Sensor ([place]) [place]  [place]

## 2017-12-29 ENCOUNTER — Ambulatory Visit: Payer: 59 | Admitting: Endocrinology

## 2018-01-02 ENCOUNTER — Inpatient Hospital Stay (HOSPITAL_COMMUNITY)
Admission: RE | Admit: 2018-01-02 | Discharge: 2018-01-02 | Disposition: A | Discharge status: Home / Self Care | Payer: 59 | Source: Ambulatory Visit | Attending: Nephrology | Admitting: Nephrology

## 2018-01-06 ENCOUNTER — Encounter (HOSPITAL_COMMUNITY)
Admission: RE | Admit: 2018-01-06 | Discharge: 2018-01-06 | Disposition: A | Discharge status: Home / Self Care | Payer: 59 | Source: Ambulatory Visit | Attending: Nephrology | Admitting: Nephrology

## 2018-01-06 VITALS — BP 153/81 | HR 74 | Temp 98.0°F | Resp 18

## 2018-01-06 DIAGNOSIS — D631 Anemia in chronic kidney disease: Secondary | ICD-10-CM | POA: Insufficient documentation

## 2018-01-06 DIAGNOSIS — Z5181 Encounter for therapeutic drug level monitoring: Secondary | ICD-10-CM | POA: Diagnosis not present

## 2018-01-06 DIAGNOSIS — N184 Chronic kidney disease, stage 4 (severe): Secondary | ICD-10-CM | POA: Diagnosis not present

## 2018-01-06 DIAGNOSIS — Z79899 Other long term (current) drug therapy: Secondary | ICD-10-CM | POA: Diagnosis not present

## 2018-01-06 DIAGNOSIS — D649 Anemia, unspecified: Secondary | ICD-10-CM

## 2018-01-06 LAB — RENAL FUNCTION PANEL
Albumin: 3 g/dL — ABNORMAL LOW (ref 3.5–5.0)
Anion gap: 11 (ref 5–15)
BUN: 54 mg/dL — ABNORMAL HIGH (ref 6–20)
CO2: 26 mmol/L (ref 22–32)
Calcium: 7.2 mg/dL — ABNORMAL LOW (ref 8.9–10.3)
Chloride: 100 mmol/L — ABNORMAL LOW (ref 101–111)
Creatinine, Ser: 5.21 mg/dL — ABNORMAL HIGH (ref 0.61–1.24)
GFR calc Af Amer: 13 mL/min — ABNORMAL LOW (ref >60)
GFR calc non Af Amer: 12 mL/min — ABNORMAL LOW (ref >60)
Glucose, Bld: 303 mg/dL — ABNORMAL HIGH (ref 65–99)
Phosphorus: 6.2 mg/dL — ABNORMAL HIGH (ref 2.5–4.6)
Potassium: 4.3 mmol/L (ref 3.5–5.1)
Sodium: 137 mmol/L (ref 135–145)

## 2018-01-06 LAB — FERRITIN: Ferritin: 830 ng/mL — ABNORMAL HIGH (ref 24–336)

## 2018-01-06 LAB — IRON AND TIBC
Iron: 67 ug/dL (ref 45–182)
Saturation Ratios: 29 % (ref 17.9–39.5)
TIBC: 231 ug/dL — ABNORMAL LOW (ref 250–450)
UIBC: 164 ug/dL

## 2018-01-06 MED ORDER — DARBEPOETIN ALFA 100 MCG/0.5ML IJ SOSY
100.0000 ug | PREFILLED_SYRINGE | INTRAMUSCULAR | Status: DC
  Administered 2018-01-06: 100 ug via SUBCUTANEOUS

## 2018-01-06 MED ORDER — DARBEPOETIN ALFA 100 MCG/0.5ML IJ SOSY
PREFILLED_SYRINGE | INTRAMUSCULAR | Status: AC
  Filled 2018-01-06: qty 0.5

## 2018-01-07 LAB — POCT HEMOGLOBIN-HEMACUE: Hemoglobin: 10 g/dL — ABNORMAL LOW (ref 13.0–17.0)

## 2018-01-24 ENCOUNTER — Ambulatory Visit: Payer: 59 | Admitting: Physician Assistant

## 2018-01-24 ENCOUNTER — Other Ambulatory Visit: Payer: Self-pay

## 2018-01-24 ENCOUNTER — Encounter: Payer: Self-pay | Admitting: Physician Assistant

## 2018-01-24 VITALS — BP 160/86 | HR 83 | Temp 97.5°F | Resp 16 | Ht 74.0 in | Wt 202.0 lb

## 2018-01-24 DIAGNOSIS — L02419 Cutaneous abscess of limb, unspecified: Secondary | ICD-10-CM

## 2018-01-24 DIAGNOSIS — R030 Elevated blood-pressure reading, without diagnosis of hypertension: Secondary | ICD-10-CM | POA: Diagnosis not present

## 2018-01-24 MED ORDER — DOXYCYCLINE HYCLATE 100 MG PO CAPS: 100.0000 mg | ORAL_CAPSULE | Freq: Two times a day (BID) | ORAL | 0 refills | Status: AC

## 2018-01-24 NOTE — Progress Notes (Signed)
    01/24/2018 10:32 AM   DOB: 1966-03-26 / MRN: [id number]  SUBJECTIVE:  [name] [name] is a 52 y.o. male presenting for "bump" under the left armpit.  This is been present now for about 3 days and is painless.  He denies redness.  Noticed the lesion when he was putting on his deodorant.  He is allergic to penicillins.   He  has a past medical history of Bacteremia, Diabetes mellitus, Hypertension, Pneumonia (02/2012), and Renal disorder.    He  reports that  has never smoked. he has never used smokeless tobacco. He reports that he drinks alcohol. He reports that he does not use drugs. He  has no sexual activity history on file. The patient  has a past surgical history that includes Esophagogastroduodenoscopy (03/31/2012); Colonoscopy (04/01/2012); and Chest tube insertion.  His family history includes Diabetes in his maternal grandmother.  Review of Systems  Constitutional: Negative for chills, diaphoresis and fever.  Gastrointestinal: Negative for nausea.  Skin: Negative for rash.  Neurological: Negative for dizziness.    The problem list and medications were reviewed and updated by myself where necessary and exist elsewhere in the encounter.   OBJECTIVE:  BP (!) 160/86   Pulse 83   Temp (!) 97.5 F (36.4 C) (Oral)   Resp 16   Ht 6\' 2"  (1.88 m)   Wt 202 lb (91.6 kg)   SpO2 99%   BMI 25.94 kg/m   BP Readings from Last 3 Encounters:  01/24/18 (!) 160/86  01/06/18 (!) 153/81  12/05/17 (!) 155/74   Physical Exam  Constitutional: He is active and cooperative.  Cardiovascular: Normal rate.  Pulmonary/Chest: Effort normal. No tachypnea.  Neurological: He is alert.  Skin: Skin is warm.     Vitals reviewed.   No results found for this or any previous visit (from the past 72 hour(s)).  No results found.  ASSESSMENT AND PLAN:  [name] was seen today for bump under left arm.  Diagnoses and all orders for this visit:  Axillary abscess: Advised that he start the  doxycycline if he notices the problem getting worse i.e. tenderness, worsening swelling.  Given the lack of fluctuance incision and drainage not prudent at this point. -     doxycycline (VIBRAMYCIN) 100 MG capsule; Take 1 capsule (100 mg total) by mouth 2 (two) times daily for 10 days.  Elevated blood pressure reading: Advised to follow-up with his primary for this.    The patient is advised to call or return to clinic if he does not see an improvement in symptoms, or to seek the care of the closest emergency department if he worsens with the above plan.   Philis Fendt, MHS, PA-C [facility] 01/24/2018 10:32 AM

## 2018-01-24 NOTE — Patient Instructions (Signed)
If the problem seems to be getting worse than start the doxycycline.

## 2018-02-06 ENCOUNTER — Encounter (HOSPITAL_COMMUNITY)
Admission: RE | Admit: 2018-02-06 | Discharge: 2018-02-06 | Disposition: A | Discharge status: Home / Self Care | Payer: 59 | Source: Ambulatory Visit | Attending: Nephrology | Admitting: Nephrology

## 2018-02-06 VITALS — BP 149/86 | HR 70 | Temp 98.0°F | Resp 18

## 2018-02-06 DIAGNOSIS — N184 Chronic kidney disease, stage 4 (severe): Secondary | ICD-10-CM | POA: Insufficient documentation

## 2018-02-06 DIAGNOSIS — Z79899 Other long term (current) drug therapy: Secondary | ICD-10-CM | POA: Insufficient documentation

## 2018-02-06 DIAGNOSIS — Z5181 Encounter for therapeutic drug level monitoring: Secondary | ICD-10-CM | POA: Insufficient documentation

## 2018-02-06 DIAGNOSIS — D631 Anemia in chronic kidney disease: Secondary | ICD-10-CM | POA: Diagnosis not present

## 2018-02-06 DIAGNOSIS — D649 Anemia, unspecified: Secondary | ICD-10-CM

## 2018-02-06 LAB — RENAL FUNCTION PANEL
Albumin: 3.1 g/dL — ABNORMAL LOW (ref 3.5–5.0)
Anion gap: 13 (ref 5–15)
BUN: 75 mg/dL — ABNORMAL HIGH (ref 6–20)
CO2: 25 mmol/L (ref 22–32)
Calcium: 7.3 mg/dL — ABNORMAL LOW (ref 8.9–10.3)
Chloride: 100 mmol/L — ABNORMAL LOW (ref 101–111)
Creatinine, Ser: 5.75 mg/dL — ABNORMAL HIGH (ref 0.61–1.24)
GFR calc Af Amer: 12 mL/min — ABNORMAL LOW (ref >60)
GFR calc non Af Amer: 10 mL/min — ABNORMAL LOW (ref >60)
Glucose, Bld: 208 mg/dL — ABNORMAL HIGH (ref 65–99)
Phosphorus: 6.3 mg/dL — ABNORMAL HIGH (ref 2.5–4.6)
Potassium: 4.4 mmol/L (ref 3.5–5.1)
Sodium: 138 mmol/L (ref 135–145)

## 2018-02-06 LAB — IRON AND TIBC
Iron: 70 ug/dL (ref 45–182)
Saturation Ratios: 30 % (ref 17.9–39.5)
TIBC: 232 ug/dL — ABNORMAL LOW (ref 250–450)
UIBC: 162 ug/dL

## 2018-02-06 LAB — FERRITIN: Ferritin: 713 ng/mL — ABNORMAL HIGH (ref 24–336)

## 2018-02-06 LAB — POCT HEMOGLOBIN-HEMACUE: Hemoglobin: 10.5 g/dL — ABNORMAL LOW (ref 13.0–17.0)

## 2018-02-06 MED ORDER — DARBEPOETIN ALFA 100 MCG/0.5ML IJ SOSY
100.0000 ug | PREFILLED_SYRINGE | INTRAMUSCULAR | Status: DC
  Administered 2018-02-06: 100 ug via SUBCUTANEOUS

## 2018-02-06 MED ORDER — DARBEPOETIN ALFA 100 MCG/0.5ML IJ SOSY
PREFILLED_SYRINGE | INTRAMUSCULAR | Status: AC
  Filled 2018-02-06: qty 0.5

## 2018-02-09 DIAGNOSIS — E291 Testicular hypofunction: Secondary | ICD-10-CM | POA: Diagnosis not present

## 2018-02-09 LAB — PTH, INTACT AND CALCIUM
Calcium, Total (PTH): 7.2 mg/dL — ABNORMAL LOW (ref 8.7–10.2)
PTH: 99 pg/mL — ABNORMAL HIGH (ref 15–65)

## 2018-02-12 DIAGNOSIS — E291 Testicular hypofunction: Secondary | ICD-10-CM | POA: Diagnosis not present

## 2018-02-12 DIAGNOSIS — Z125 Encounter for screening for malignant neoplasm of prostate: Secondary | ICD-10-CM | POA: Diagnosis not present

## 2018-03-02 ENCOUNTER — Encounter: Payer: Self-pay | Admitting: Family Medicine

## 2018-03-02 ENCOUNTER — Encounter (HOSPITAL_COMMUNITY): Payer: Self-pay

## 2018-03-02 ENCOUNTER — Inpatient Hospital Stay (HOSPITAL_COMMUNITY)
Admission: EM | Admit: 2018-03-02 | Discharge: 2018-03-03 | DRG: 291 | Disposition: A | Discharge status: Home / Self Care | Payer: 59 | Attending: Internal Medicine | Admitting: Internal Medicine

## 2018-03-02 ENCOUNTER — Ambulatory Visit: Payer: 59 | Admitting: Family Medicine

## 2018-03-02 ENCOUNTER — Ambulatory Visit (INDEPENDENT_AMBULATORY_CARE_PROVIDER_SITE_OTHER): Payer: 59

## 2018-03-02 ENCOUNTER — Ambulatory Visit: Payer: 59 | Admitting: Physician Assistant

## 2018-03-02 ENCOUNTER — Other Ambulatory Visit: Payer: Self-pay

## 2018-03-02 VITALS — BP 138/64 | HR 80 | Temp 98.1°F | Resp 18 | Ht 74.0 in | Wt 214.4 lb

## 2018-03-02 DIAGNOSIS — Z833 Family history of diabetes mellitus: Secondary | ICD-10-CM | POA: Diagnosis not present

## 2018-03-02 DIAGNOSIS — I5033 Acute on chronic diastolic (congestive) heart failure: Secondary | ICD-10-CM | POA: Diagnosis present

## 2018-03-02 DIAGNOSIS — I10 Essential (primary) hypertension: Secondary | ICD-10-CM | POA: Diagnosis present

## 2018-03-02 DIAGNOSIS — E119 Type 2 diabetes mellitus without complications: Secondary | ICD-10-CM

## 2018-03-02 DIAGNOSIS — Z8701 Personal history of pneumonia (recurrent): Secondary | ICD-10-CM

## 2018-03-02 DIAGNOSIS — J90 Pleural effusion, not elsewhere classified: Secondary | ICD-10-CM | POA: Diagnosis not present

## 2018-03-02 DIAGNOSIS — N185 Chronic kidney disease, stage 5: Secondary | ICD-10-CM | POA: Diagnosis present

## 2018-03-02 DIAGNOSIS — R05 Cough: Secondary | ICD-10-CM | POA: Diagnosis not present

## 2018-03-02 DIAGNOSIS — J189 Pneumonia, unspecified organism: Secondary | ICD-10-CM | POA: Diagnosis present

## 2018-03-02 DIAGNOSIS — N189 Chronic kidney disease, unspecified: Secondary | ICD-10-CM | POA: Diagnosis not present

## 2018-03-02 DIAGNOSIS — Z794 Long term (current) use of insulin: Secondary | ICD-10-CM | POA: Diagnosis not present

## 2018-03-02 DIAGNOSIS — N179 Acute kidney failure, unspecified: Secondary | ICD-10-CM | POA: Diagnosis not present

## 2018-03-02 DIAGNOSIS — R6883 Chills (without fever): Secondary | ICD-10-CM

## 2018-03-02 DIAGNOSIS — I5032 Chronic diastolic (congestive) heart failure: Secondary | ICD-10-CM | POA: Diagnosis present

## 2018-03-02 DIAGNOSIS — D631 Anemia in chronic kidney disease: Secondary | ICD-10-CM | POA: Diagnosis present

## 2018-03-02 DIAGNOSIS — I132 Hypertensive heart and chronic kidney disease with heart failure and with stage 5 chronic kidney disease, or end stage renal disease: Principal | ICD-10-CM | POA: Diagnosis present

## 2018-03-02 DIAGNOSIS — Z7982 Long term (current) use of aspirin: Secondary | ICD-10-CM | POA: Diagnosis not present

## 2018-03-02 DIAGNOSIS — J849 Interstitial pulmonary disease, unspecified: Secondary | ICD-10-CM | POA: Diagnosis not present

## 2018-03-02 DIAGNOSIS — E1122 Type 2 diabetes mellitus with diabetic chronic kidney disease: Secondary | ICD-10-CM | POA: Diagnosis present

## 2018-03-02 DIAGNOSIS — R0602 Shortness of breath: Secondary | ICD-10-CM | POA: Diagnosis not present

## 2018-03-02 DIAGNOSIS — J9601 Acute respiratory failure with hypoxia: Secondary | ICD-10-CM | POA: Diagnosis not present

## 2018-03-02 DIAGNOSIS — R059 Cough, unspecified: Secondary | ICD-10-CM

## 2018-03-02 LAB — COMPREHENSIVE METABOLIC PANEL
ALT: 20 U/L (ref 17–63)
AST: 20 U/L (ref 15–41)
Albumin: 3.2 g/dL — ABNORMAL LOW (ref 3.5–5.0)
Alkaline Phosphatase: 78 U/L (ref 38–126)
Anion gap: 13 (ref 5–15)
BUN: 88 mg/dL — ABNORMAL HIGH (ref 6–20)
CO2: 25 mmol/L (ref 22–32)
Calcium: 7.3 mg/dL — ABNORMAL LOW (ref 8.9–10.3)
Chloride: 100 mmol/L — ABNORMAL LOW (ref 101–111)
Creatinine, Ser: 6.01 mg/dL — ABNORMAL HIGH (ref 0.61–1.24)
GFR calc Af Amer: 11 mL/min — ABNORMAL LOW (ref >60)
GFR calc non Af Amer: 10 mL/min — ABNORMAL LOW (ref >60)
Glucose, Bld: 237 mg/dL — ABNORMAL HIGH (ref 65–99)
Potassium: 4.6 mmol/L (ref 3.5–5.1)
Sodium: 138 mmol/L (ref 135–145)
Total Bilirubin: 0.9 mg/dL (ref 0.3–1.2)
Total Protein: 6.8 g/dL (ref 6.5–8.1)

## 2018-03-02 LAB — CBC WITH DIFFERENTIAL/PLATELET
Basophils Absolute: 0 10*3/uL (ref 0.0–0.1)
Basophils Relative: 0 %
Eosinophils Absolute: 0.3 10*3/uL (ref 0.0–0.7)
Eosinophils Relative: 3 %
HCT: 30.5 % — ABNORMAL LOW (ref 39.0–52.0)
Hemoglobin: 10 g/dL — ABNORMAL LOW (ref 13.0–17.0)
Lymphocytes Relative: 14 %
Lymphs Abs: 1.4 10*3/uL (ref 0.7–4.0)
MCH: 29.9 pg (ref 26.0–34.0)
MCHC: 32.8 g/dL (ref 30.0–36.0)
MCV: 91 fL (ref 78.0–100.0)
Monocytes Absolute: 0.5 10*3/uL (ref 0.1–1.0)
Monocytes Relative: 5 %
Neutro Abs: 7.8 10*3/uL — ABNORMAL HIGH (ref 1.7–7.7)
Neutrophils Relative %: 78 %
Platelets: 165 10*3/uL (ref 150–400)
RBC: 3.35 MIL/uL — ABNORMAL LOW (ref 4.22–5.81)
RDW: 12.9 % (ref 11.5–15.5)
WBC: 10.1 10*3/uL (ref 4.0–10.5)

## 2018-03-02 LAB — POCT CBC
Granulocyte percent: 77.6 %G (ref 37–80)
HCT, POC: 31.1 % — AB (ref 43.5–53.7)
Hemoglobin: 10.3 g/dL — AB (ref 14.1–18.1)
Lymph, poc: 1.6 (ref 0.6–3.4)
MCH, POC: 29.7 pg (ref 27–31.2)
MCHC: 33 g/dL (ref 31.8–35.4)
MCV: 0 fL — AB (ref 80–97)
MID (cbc): 0.4 (ref 0–0.9)
MPV: 8.2 fL (ref 0–99.8)
POC Granulocyte: 7 — AB (ref 2–6.9)
POC LYMPH PERCENT: 18.2 %L (ref 10–50)
POC MID %: 4.4 %M (ref 0–12)
Platelet Count, POC: 170 10*3/uL (ref 142–424)
RBC: 3.45 M/uL — AB (ref 4.69–6.13)
RDW, POC: 13.9 %
WBC: 9 10*3/uL (ref 4.6–10.2)

## 2018-03-02 LAB — CREATININE, URINE, RANDOM: Creatinine, Urine: 74.28 mg/dL

## 2018-03-02 LAB — SODIUM, URINE, RANDOM: Sodium, Ur: 39 mmol/L

## 2018-03-02 LAB — POC INFLUENZA A&B (BINAX/QUICKVUE)
Influenza A, POC: NEGATIVE
Influenza B, POC: NEGATIVE

## 2018-03-02 LAB — I-STAT CG4 LACTIC ACID, ED: Lactic Acid, Venous: 0.8 mmol/L (ref 0.5–1.9)

## 2018-03-02 LAB — BRAIN NATRIURETIC PEPTIDE: B Natriuretic Peptide: 748.4 pg/mL — ABNORMAL HIGH (ref 0.0–100.0)

## 2018-03-02 LAB — CBG MONITORING, ED: Glucose-Capillary: 167 mg/dL — ABNORMAL HIGH (ref 65–99)

## 2018-03-02 MED ORDER — SODIUM CHLORIDE 0.9 % IV SOLN
1.0000 g | Freq: Once | INTRAVENOUS | Status: AC
  Administered 2018-03-02: 1 g via INTRAVENOUS
  Filled 2018-03-02: qty 10

## 2018-03-02 MED ORDER — INSULIN ASPART 100 UNIT/ML ~~LOC~~ SOLN: 0.0000 [IU] | Freq: Every day | SUBCUTANEOUS | Status: DC

## 2018-03-02 MED ORDER — ACETAMINOPHEN 325 MG PO TABS: 650.0000 mg | ORAL_TABLET | ORAL | Status: DC | PRN

## 2018-03-02 MED ORDER — INSULIN ASPART 100 UNIT/ML ~~LOC~~ SOLN
0.0000 [IU] | Freq: Three times a day (TID) | SUBCUTANEOUS | Status: DC
  Administered 2018-03-03: 5 [IU] via SUBCUTANEOUS
  Filled 2018-03-02 (×2): qty 1

## 2018-03-02 MED ORDER — ONDANSETRON HCL 4 MG/2ML IJ SOLN: 4.0000 mg | Freq: Four times a day (QID) | INTRAMUSCULAR | Status: DC | PRN

## 2018-03-02 MED ORDER — AMLODIPINE BESYLATE 5 MG PO TABS
10.0000 mg | ORAL_TABLET | Freq: Every day | ORAL | Status: DC
  Administered 2018-03-03: 10 mg via ORAL
  Filled 2018-03-02: qty 2

## 2018-03-02 MED ORDER — CARVEDILOL 12.5 MG PO TABS
37.5000 mg | ORAL_TABLET | Freq: Two times a day (BID) | ORAL | Status: DC
  Administered 2018-03-02 – 2018-03-03 (×2): 37.5 mg via ORAL
  Filled 2018-03-02 (×2): qty 3

## 2018-03-02 MED ORDER — HEPARIN SODIUM (PORCINE) 5000 UNIT/ML IJ SOLN
5000.0000 [IU] | Freq: Three times a day (TID) | INTRAMUSCULAR | Status: DC
  Administered 2018-03-02 – 2018-03-03 (×2): 5000 [IU] via SUBCUTANEOUS
  Filled 2018-03-02 (×2): qty 1

## 2018-03-02 MED ORDER — INSULIN GLARGINE 100 UNIT/ML ~~LOC~~ SOLN
25.0000 [IU] | Freq: Every day | SUBCUTANEOUS | Status: DC
  Administered 2018-03-02: 25 [IU] via SUBCUTANEOUS
  Filled 2018-03-02: qty 0.25

## 2018-03-02 MED ORDER — SODIUM CHLORIDE 0.9 % IV SOLN: 1.0000 g | INTRAVENOUS | Status: DC

## 2018-03-02 MED ORDER — FUROSEMIDE 10 MG/ML IJ SOLN
100.0000 mg | Freq: Two times a day (BID) | INTRAVENOUS | Status: DC
  Administered 2018-03-03: 100 mg via INTRAVENOUS
  Filled 2018-03-02 (×2): qty 10

## 2018-03-02 MED ORDER — DM-GUAIFENESIN ER 30-600 MG PO TB12: 1.0000 | ORAL_TABLET | Freq: Two times a day (BID) | ORAL | Status: DC | PRN

## 2018-03-02 MED ORDER — SODIUM CHLORIDE 0.9 % IV SOLN: 250.0000 mL | INTRAVENOUS | Status: DC | PRN

## 2018-03-02 MED ORDER — FUROSEMIDE 20 MG PO TABS: 160.0000 mg | ORAL_TABLET | Freq: Two times a day (BID) | ORAL | Status: DC

## 2018-03-02 MED ORDER — ASPIRIN EC 81 MG PO TBEC
81.0000 mg | DELAYED_RELEASE_TABLET | ORAL | Status: DC
  Administered 2018-03-03: 81 mg via ORAL
  Filled 2018-03-02: qty 1

## 2018-03-02 MED ORDER — RENA-VITE PO TABS
1.0000 | ORAL_TABLET | Freq: Every day | ORAL | Status: DC
  Administered 2018-03-03: 1 via ORAL
  Filled 2018-03-02: qty 1

## 2018-03-02 MED ORDER — FUROSEMIDE 10 MG/ML IJ SOLN
160.0000 mg | Freq: Once | INTRAVENOUS | Status: AC
  Administered 2018-03-02: 160 mg via INTRAVENOUS
  Filled 2018-03-02: qty 16

## 2018-03-02 MED ORDER — SODIUM CHLORIDE 0.9% FLUSH: 3.0000 mL | INTRAVENOUS | Status: DC | PRN

## 2018-03-02 MED ORDER — ZOLPIDEM TARTRATE 5 MG PO TABS: 5.0000 mg | ORAL_TABLET | Freq: Every evening | ORAL | Status: DC | PRN

## 2018-03-02 MED ORDER — SODIUM CHLORIDE 0.9% FLUSH
3.0000 mL | Freq: Two times a day (BID) | INTRAVENOUS | Status: DC
  Administered 2018-03-03: 3 mL via INTRAVENOUS

## 2018-03-02 MED ORDER — SODIUM CHLORIDE 0.9 % IV SOLN
500.0000 mg | Freq: Once | INTRAVENOUS | Status: AC
  Administered 2018-03-02: 500 mg via INTRAVENOUS
  Filled 2018-03-02: qty 500

## 2018-03-02 MED ORDER — SODIUM CHLORIDE 0.9 % IV SOLN: 500.0000 mg | INTRAVENOUS | Status: DC

## 2018-03-02 MED ORDER — FUROSEMIDE 10 MG/ML IJ SOLN
80.0000 mg | Freq: Once | INTRAMUSCULAR | Status: DC
  Filled 2018-03-02: qty 8

## 2018-03-02 NOTE — ED Notes (Signed)
ED Provider at bedside. 

## 2018-03-02 NOTE — H&P (Signed)
History and Physical    [name] [name] LKG:[id number] DOB: [birth year] DOA: 03/02/2018  PCP: Christain Sacramento, MD   Patient coming from: Home  Chief Complaint: SOB, cough, chills, orthopnea   HPI: [name] [name] is a 52 y.o. male with medical history significant for chronic kidney disease stage IV, chronic diastolic CHF, hypertension, and insulin-dependent diabetes mellitus, now presenting to the emergency department for evaluation of cough, shortness of breath, chills, and orthopnea.  Patient reports that he was in his usual state until approximately 2 days ago when he noted the insidious development of worsening dyspnea with nonproductive cough and orthopnea.  He has also had chills over this interval.  Denies chest pain or palpitations, but reports increased swelling in the bilateral lower extremities.  He follows with nephrology, reports having discussed dialysis, but he does not have access yet.  ED Course: Upon arrival to the ED, patient is found to be afebrile, saturating high 80s on room air, and with vitals otherwise normal.  EKG features a sinus rhythm with LBBB and chest x-ray is notable for bibasilar interstitial lung disease, right greater than left, as well as small right pleural effusion.  Patient was treated with 160 mg IV Lasix, Rocephin, and azithromycin in the ED.  He was started on supplemental oxygen.  He remains hemodynamically stable, is not in acute distress, and will be admitted to the telemetry unit for ongoing evaluation and management of shortness of breath with new supplemental O2-requirement, likely multifactorial with contributions from acute on chronic diastolic CHF and possible CAP.  Review of Systems:  All other systems reviewed and apart from HPI, are negative.  Past Medical History:  Diagnosis Date  . Bacteremia   . Diabetes mellitus   . Hypertension   . Pneumonia 02/2012   Strep pneumoniae bilateral pneumonia complicated by bacteremia  . Renal  disorder    End stage, not on dialysis    Past Surgical History:  Procedure Laterality Date  . CHEST TUBE INSERTION     placed during hospitalization 02/2012  . COLONOSCOPY  04/01/2012   Procedure: COLONOSCOPY;  Surgeon: Juanita Craver, MD;  [facility]COPY;  Service: Endoscopy;  Laterality: N/A;  . ESOPHAGOGASTRODUODENOSCOPY  03/31/2012   Procedure: ESOPHAGOGASTRODUODENOSCOPY (EGD);  Surgeon: Beryle Beams, MD;  Location: [facility] ENDOSCOPY;  Service: Endoscopy;  Laterality: N/A;     reports that  has never smoked. he has never used smokeless tobacco. He reports that he drinks alcohol. He reports that he does not use drugs.  Allergies  Allergen Reactions  . Penicillins Rash    Tolerating Ceftriaxone (03/12/12) Has patient had a PCN reaction causing immediate rash, facial/tongue/throat swelling, SOB or lightheadedness with hypotension: Yes Has patient had a PCN reaction causing severe rash involving mucus membranes or skin necrosis: Unk Has patient had a PCN reaction that required hospitalization: No Has patient had a PCN reaction occurring within the last 10 years: No If all of the above answers are "NO", then may proceed with Cephalosporin use.     Family History  Problem Relation Age of Onset  . Diabetes Maternal Grandmother      Prior to Admission medications   Medication Sig Start Date End Date Taking? Authorizing Provider  amLODipine (NORVASC) 10 MG tablet Take 10 mg by mouth daily.  03/21/17  Yes [provider]  aspirin 81 MG tablet Take 81 mg by mouth every morning.    Yes [provider]  B Complex-C-Folic Acid (RENAL VITAMIN PO) Take 1  tablet by mouth daily.    Yes [provider]  carvedilol (COREG) 25 MG tablet Take 1.5 tablets (37.5 mg total) by mouth 2 (two) times daily with a meal. 03/25/17  Yes Troy Sine, MD  Darbepoetin Alfa (ARANESP) 100 MCG/0.5ML SOSY injection Inject 100 mcg into the skin every 28 (twenty-eight) days.   Yes  [provider]  dextromethorphan-guaiFENesin (MUCINEX DM) 30-600 MG 12hr tablet Take 1 tablet by mouth 2 (two) times daily as needed for cough.   Yes [provider]  furosemide (LASIX) 80 MG tablet Take 160 mg by mouth 2 (two) times daily.  03/15/17  Yes [provider]  insulin aspart (NOVOLOG FLEXPEN) 100 UNIT/ML FlexPen 5-15 units before meals Patient taking differently: Inject 0-11 Units into the skin See admin instructions. 0-11 units three times a day as needed before meals, per sliding scale 04/08/17  Yes Elayne Snare, MD  TRESIBA FLEXTOUCH 100 UNIT/ML SOPN FlexTouch Pen INJECT 0.38 MLS (38 UNITS TOTAL) INTO THE SKIN DAILY AT 10 PM. Patient taking differently: Inject 38 Units into the skin at bedtime.  05/16/17  Yes Elayne Snare, MD  Vitamin D, Ergocalciferol, (DRISDOL) 50000 units CAPS capsule Take 50,000 Units by mouth every Sunday.    Yes [provider]  Continuous Blood Gluc Receiver (FREESTYLE LIBRE READER) DEVI 1 Device by Does not apply route as directed. 03/31/17   Elayne Snare, MD  Continuous Blood Gluc Sensor (FREESTYLE LIBRE SENSOR SYSTEM) MISC Apply sensor to upper arm and change sensor every 10 days. 12/26/17   Elayne Snare, MD  Insulin Pen Needle 31G X 5 MM MISC Use to inject Tyler Aas once daily 03/28/17   Elayne Snare, MD    Physical Exam: Vitals:   03/02/18 1828 03/02/18 1845 03/02/18 1930 03/02/18 2000  BP:   (!) 156/87 (!) 159/88  Pulse: 78 79 76 76  Resp:      Temp:      TempSrc:      SpO2: 93% 94% 91% 93%  Weight:          Constitutional: NAD, calm  Eyes: PERTLA, lids and conjunctivae normal ENMT: Mucous membranes are moist. Posterior pharynx clear of any exudate or lesions.   Neck: normal, supple, no masses, no thyromegaly Respiratory: Mild dyspnea with speech, fine rales in bases. No accessory muscle use.  Cardiovascular: S1 & S2 heard, regular rate and rhythm. 1+ pretibial edema bilaterally. JVD throughout neck.  Abdomen: No  distension, no tenderness, no masses palpated. Bowel sounds normal.  Musculoskeletal: no clubbing / cyanosis. No joint deformity upper and lower extremities.   Skin: no significant rashes, lesions, ulcers. Warm, dry, well-perfused. Neurologic: CN 2-12 grossly intact. Sensation intact. Strength 5/5 in all 4 limbs.  Psychiatric: Alert and oriented x 3. Calm, cooperative.     Labs on Admission: I have personally reviewed following labs and imaging studies  CBC: Recent Labs  Lab 03/02/18 1312 03/02/18 1820  WBC 9.0 10.1  NEUTROABS  --  7.8*  HGB 10.3* 10.0*  HCT 31.1* 30.5*  MCV 0.0* 91.0  PLT  --  161   Basic Metabolic Panel: Recent Labs  Lab 03/02/18 1820  NA 138  K 4.6  CL 100*  CO2 25  GLUCOSE 237*  BUN 88*  CREATININE 6.01*  CALCIUM 7.3*   GFR: Estimated Creatinine Clearance: 16.9 mL/min (A) (by C-G formula based on SCr of 6.01 mg/dL (H)). Liver Function Tests: Recent Labs  Lab 03/02/18 1820  AST 20  ALT 20  ALKPHOS 78  BILITOT 0.9  PROT 6.8  ALBUMIN 3.2*   No results for input(s): LIPASE, AMYLASE in the last 168 hours. No results for input(s): AMMONIA in the last 168 hours. Coagulation Profile: No results for input(s): INR, PROTIME in the last 168 hours. Cardiac Enzymes: No results for input(s): CKTOTAL, CKMB, CKMBINDEX, TROPONINI in the last 168 hours. BNP (last 3 results) No results for input(s): PROBNP in the last 8760 hours. HbA1C: No results for input(s): HGBA1C in the last 72 hours. CBG: No results for input(s): GLUCAP in the last 168 hours. Lipid Profile: No results for input(s): CHOL, HDL, LDLCALC, TRIG, CHOLHDL, LDLDIRECT in the last 72 hours. Thyroid Function Tests: No results for input(s): TSH, T4TOTAL, FREET4, T3FREE, THYROIDAB in the last 72 hours. Anemia Panel: No results for input(s): VITAMINB12, FOLATE, FERRITIN, TIBC, IRON, RETICCTPCT in the last 72 hours. Urine analysis:    Component Value Date/Time   COLORURINE STRAW (A)  01/29/2017 2304   APPEARANCEUR CLEAR 01/29/2017 2304   LABSPEC 1.010 01/29/2017 2304   PHURINE 5.0 01/29/2017 2304   GLUCOSEU 150 (A) 01/29/2017 2304   HGBUR NEGATIVE 01/29/2017 2304   BILIRUBINUR NEGATIVE 01/29/2017 2304   BILIRUBINUR negative 01/13/2017 1235   KETONESUR NEGATIVE 01/29/2017 2304   PROTEINUR 100 (A) 01/29/2017 2304   UROBILINOGEN 0.2 01/13/2017 1235   UROBILINOGEN 0.2 04/09/2012 2229   NITRITE NEGATIVE 01/29/2017 2304   LEUKOCYTESUR NEGATIVE 01/29/2017 2304   Sepsis Labs: @LABRCNTIP (procalcitonin:4,lacticidven:4) )No results found for this or any previous visit (from the past 240 hour(s)).   Radiological Exams on Admission: Dg Chest 2 View  Result Date: 03/02/2018 CLINICAL DATA:  Cough and chills. Left lower lobe rhonchi and rales. EXAM: CHEST - 2 VIEW COMPARISON:  Chest x-rays dated 10/26/2013 and 05/26/2012 and chest CT dated 06/05/2012 FINDINGS: Heart size is normal although increased since the prior exam. Pulmonary vascularity is normal. Patient has progressive interstitial disease in the right lung base and new slight interstitial disease at the left lung base. There is a new small right effusion. No significant bone abnormality. IMPRESSION: 1. Bibasilar interstitial lung disease, right greater than left, nonspecific. This could represent pneumonitis. The 2. New small right pleural effusion. 3. Slight increase in cardiac size although still within normal limits. Electronically Signed   By: Lorriane Shire M.D.   On: 03/02/2018 13:06    EKG: Independently reviewed. Sinus rhythm, LBBB.   Assessment/Plan  1. CAP  - Presents with cough, SOB, and chills  - Seen initially at urgent care where influenza PCR was negative  - Treated in ED with Rocephin and azithromycin  - Check sputum culture, procalcitonin, strep pneumo and legionella urinary antigens  - Continue current abx, continue prn supplemental O2, follows cultures and clinical course   2. Acute on chronic  diastolic CHF  - Presents with cough and SOB, also reports orthopnea  - Leg edema noted, BNP elevated, neck veins distended  - Echo from one yr ago with preserved EF, mild LVH, and grade 1 diastolic dysfunction  - Managed at home with Lasix 160 mg BID and Coreg  - Given dose of Lasix 160 mg IV in ED, plan to continue diuresis with Lasix 100 mg IV q12h, continue Coreg, SLIV and fluid-restrict diet, follow I/O's and daily wts, update echo    3. Acute kidney injury superimposed on CKD stage IV-V  - SCr is 6.01 on admission, up from apparent baseline of ~5.2  - Has been following with nephrology, reports discussing dialysis but no  access yet  - Appears hypervolemic and given dose of IV Lasix in ED  - No acidosis or acute electrolyte issues  - Renally-dose medication, avoid nephrotoxins, repeat chem panel in am   4. Insulin-dependent DM  - A1c was 6.3% last April  - Managed at home with Tresiba 38 units qHS and Novolog 0-11 units TID  - Follow CBG's, start with Lantus 25 units and Novolog sliding-scale and adjust as needed    5. Hypertension  - BP at goal  - Continue Norvasc and Coreg    6. Normocytic anemia  - Hgb appears to be stable on admission at 10.0 with no bleeding  - Likely secondary to CKD     DVT prophylaxis: sq heparin  Code Status: Full  Family Communication: Daughter updated at bedside Consults called: None Admission status: Inpatient    Vianne Bulls, MD [facility] Pager [phone]  If 7PM-7AM, please contact night-coverage [email or web address] Password [facility]  03/02/2018, 9:01 PM

## 2018-03-02 NOTE — Progress Notes (Addendum)
Subjective:  This chart was scribed for [name] Ray MD, by Tamsen Roers, at [place] at [facility] .  This patient was seen in room 10 and the patient's care was started at 12:43 PM.   Chief Complaint  Patient presents with  . Cough    been up the last two nights coughing, has to sit up right to sleep, and has chills      Patient ID: [name] [name], male    DOB: [birth year], 52 y.o.   MRN: [id number]  HPI HPI Comments: [name] [name] is a 51 y.o. male who presents to Primary Care at [facility] with a cough.  [name] [name] has a history of multiple medical problems including diabetes, insulin dependent. Pneumonia with bacteremia in 2013 with a parapneumonic effusion at that time.  Today presents with cough for the past two nights, chills.  Of note, [name] [name] has had pneumovax and flu vaccine in 2018.--- Patient has had difficulty sleeping due to his cough (past two days) and has to sleep upright.  [name] [name] has associated symptoms of chills and can hear a crackling sound while breathing. When [name] [name] does take deep breaths, [name] [name] begins to cough.  [name] [name] has also been congested with an increasing amount of mucous over the past two weeks.  [name] [name] has tried taking mucin-ex but denies any relief. [name] [name] denies having much of an appetite but was able to eat breakfast this morning.  Patient does not have any other family members living with him at home. [name] [name] has a dog (beagle).  [name] [name] notes of a similar symptoms in the past which eventually led to pneumonia.  Patient does not use inhalers. Patient denies any history of heart failure. [name] [name] is currently on a kidney transplant list. Not currently on prednisone or immune suppressants. Does admit to feeling winded with activity at times past few days. Not at rest.   Patient Active Problem List   Diagnosis Date Noted  . AKI (acute kidney injury) ([place]) 01/29/2017  . Palpitations 01/13/2017  . Essential hypertension, benign 10/26/2013  . Dyspnea 06/09/2012  . Dizziness 06/09/2012  .  Pneumonia, organism unspecified(486) 04/07/2012  . Pleural effusion 04/07/2012  . Parapneumonic effusion 03/28/2012  . Symptomatic anemia 03/28/2012  . Hypoalbuminemia 03/28/2012  . Total bilirubin, elevated 03/28/2012  . Bilateral leg edema 03/23/2012  . Cyst of skin 03/23/2012  . Bacteremia due to Streptococcus pneumoniae 03/15/2012  . Hyponatremia 03/15/2012  . Acute kidney failure 03/13/2012  . Elevated LFTs 03/13/2012  . Pneumococcal pneumonia ([place]) 03/12/2012  . Diabetes mellitus ([place]) 03/12/2012   Past Medical History:  Diagnosis Date  . Bacteremia   . Diabetes mellitus   . Hypertension   . Pneumonia 02/2012   Strep pneumoniae bilateral pneumonia complicated by bacteremia  . Renal disorder    End stage, not on dialysis   Past Surgical History:  Procedure Laterality Date  . CHEST TUBE INSERTION     placed during hospitalization 02/2012  . COLONOSCOPY  04/01/2012   Procedure: COLONOSCOPY;  Surgeon: Juanita Craver, MD;  Location: [facility] ENDOSCOPY;  Service: Endoscopy;  Laterality: N/A;  . ESOPHAGOGASTRODUODENOSCOPY  03/31/2012   Procedure: ESOPHAGOGASTRODUODENOSCOPY (EGD);  Surgeon: Beryle Beams, MD;  Location: [facility] ENDOSCOPY;  Service: Endoscopy;  Laterality: N/A;   Allergies  Allergen Reactions  . Penicillins Rash    Tolerating Ceftriaxone (03/12/12)   Prior to Admission medications   Medication Sig Start Date End Date Taking? Authorizing Provider  amLODipine (NORVASC) 10 MG tablet Take 1 tablet by mouth daily.  03/21/17  Yes [provider]  aspirin 81 MG tablet Take 81 mg by mouth every morning.    Yes [provider]  B Complex-C-Folic Acid (RENAL VITAMIN PO) Take by mouth.   Yes [provider]  carvedilol (COREG) 25 MG tablet Take 1.5 tablets (37.5 mg total) by mouth 2 (two) times daily with a meal. 03/25/17  Yes Troy Sine, MD  Continuous Blood Gluc Receiver (FREESTYLE LIBRE READER) DEVI 1 Device by Does not apply route as directed. 03/31/17  Yes  Elayne Snare, MD  Continuous Blood Gluc Sensor (FREESTYLE LIBRE SENSOR SYSTEM) MISC Apply sensor to upper arm and change sensor every 10 days. 12/26/17  Yes Elayne Snare, MD  furosemide (LASIX) 80 MG tablet Take 1 tablet by mouth 2 (two) times daily.  03/15/17  Yes [provider]  insulin aspart (NOVOLOG FLEXPEN) 100 UNIT/ML FlexPen 5-15 units before meals 04/08/17  Yes Elayne Snare, MD  Insulin Pen Needle 31G X 5 MM MISC Use to inject [place] once daily 03/28/17  Yes Elayne Snare, MD  TRESIBA FLEXTOUCH 100 UNIT/ML SOPN FlexTouch Pen INJECT 0.38 MLS (38 UNITS TOTAL) INTO THE SKIN DAILY AT 10 PM. 05/16/17  Yes Elayne Snare, MD  Vitamin D, Ergocalciferol, (DRISDOL) 50000 units CAPS capsule Take 50,000 Units by mouth every 7 (seven) days.   Yes [provider]   Social History   Socioeconomic History  . Marital status: Divorced    Spouse name: Not on file  . Number of children: Not on file  . Years of education: Not on file  . Highest education level: Not on file  Social Needs  . Financial resource strain: Not on file  . Food insecurity - worry: Not on file  . Food insecurity - inability: Not on file  . Transportation needs - medical: Not on file  . Transportation needs - non-medical: Not on file  Occupational History  . Occupation: Information systems manager: [employer]  Tobacco Use  . Smoking status: Never Smoker  . Smokeless tobacco: Never Used  Substance and Sexual Activity  . Alcohol use: Yes    Comment: rare   . Drug use: No  . Sexual activity: Not on file  Other Topics Concern  . Not on file  Social History Narrative   [name] [name] is from [place] and lives at home here with his daughter. [name] [name] is divorced. 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     Review of Systems  Constitutional: Positive for appetite change and chills. Negative for fever.  HENT: Positive for congestion.   Eyes: Negative for pain, redness and itching.    Respiratory: Positive for cough.   Gastrointestinal: Negative for nausea and vomiting.  Neurological: Negative for syncope and speech difficulty.      Objective:   Physical Exam  Constitutional: [name] [name] is oriented to person, place, and time. [name] [name] appears well-developed and well-nourished. No distress.  HENT:  Head: Normocephalic and atraumatic.  Right Ear: Tympanic membrane, external ear and ear canal normal.  Left Ear: Tympanic membrane, external ear and ear canal normal.  Nose: No rhinorrhea.  Mouth/Throat: Oropharynx is clear and moist and mucous membranes are normal. No oropharyngeal exudate or posterior oropharyngeal erythema.  Eyes: Conjunctivae are normal. Pupils are equal, round, and reactive to light.  Neck: Neck supple.  No apparent JVD  Cardiovascular: Normal rate, regular rhythm, normal heart sounds and intact distal pulses.  No murmur heard. Trace  pedal edema.   Pulmonary/Chest: Effort normal and breath sounds normal. [name] [name] has no wheezes. [name] [name] has no rhonchi. [name] [name] has no rales.  Some coarse breath sounds with possible rales left lower lobe greater than right.   Abdominal: Soft. There is no tenderness.  Lymphadenopathy:    [name] [name] has no cervical adenopathy.  Neurological: [name] [name] is alert and oriented to person, place, and time.  Skin: Skin is warm and dry. No rash noted.  Psychiatric: [name] [name] has a normal mood and affect. His behavior is normal.  Vitals reviewed.   Vitals:   03/02/18 1202  BP: 138/64  Pulse: 80  Resp: 18  Temp: 98.1 F (36.7 C)  TempSrc: Oral  SpO2: 91%  Weight: 214 lb 6.4 oz (97.3 kg)  Height: 6\' 2"  (1.88 m)   Results for orders placed or performed in visit on 03/02/18  POC Influenza A&B(BINAX/QUICKVUE)  Result Value Ref Range   Influenza A, POC Negative Negative   Influenza B, POC Negative Negative   Results for orders placed or performed in visit on 03/02/18  POC Influenza A&B(BINAX/QUICKVUE)  Result Value Ref Range   Influenza A, POC Negative Negative    Influenza B, POC Negative Negative  POCT CBC  Result Value Ref Range   WBC 9.0 4.6 - 10.2 K/uL   Lymph, poc 1.6 0.6 - 3.4   POC LYMPH PERCENT 18.2 10 - 50 %L   MID (cbc) 0.4 0 - 0.9   POC MID % 4.4 0 - 12 %M   POC Granulocyte 7.0 (A) 2 - 6.9   Granulocyte percent 77.6 37 - 80 %G   RBC 3.45 (A) 4.69 - 6.13 M/uL   Hemoglobin 10.3 (A) 14.1 - 18.1 g/dL   HCT, POC 31.1 (A) 43.5 - 53.7 %   MCV 0.0 (A) 80 - 97 fL   MCH, POC 29.7 27 - 31.2 pg   MCHC 33.0 31.8 - 35.4 g/dL   RDW, POC 13.9 %   Platelet Count, POC 170 142 - 424 K/uL   MPV 8.2 0 - 99.8 fL   Dg Chest 2 View  Result Date: 03/02/2018 CLINICAL DATA:  Cough and chills. Left lower lobe rhonchi and rales. EXAM: CHEST - 2 VIEW COMPARISON:  Chest x-rays dated 10/26/2013 and 05/26/2012 and chest CT dated 06/05/2012 FINDINGS: Heart size is normal although increased since the prior exam. Pulmonary vascularity is normal. Patient has progressive interstitial disease in the right lung base and new slight interstitial disease at the left lung base. There is a new small right effusion. No significant bone abnormality. IMPRESSION: 1. Bibasilar interstitial lung disease, right greater than left, nonspecific. This could represent pneumonitis. The 2. New small right pleural effusion. 3. Slight increase in cardiac size although still within normal limits. Electronically Signed   By: Lorriane Shire M.D.   On: 03/02/2018 13:06       Assessment & Plan:  STAVROS CAIL is a 52 y.o. male Cough - Plan: POC Influenza A&B(BINAX/QUICKVUE), POCT CBC  History of pneumonia - Plan: DG Chest 2 View  Chills - Plan: DG Chest 2 View  Interstitial lung disease ([place])  Pleural effusion, right  History multiple medical problems including chronic kidney disease, diabetes, insulin-dependent with worsening cough. History of significant pneumonia in 2013. Initially suspected viral syndrome with secondary sickening past 2 days. Increased dyspnea on exertion,  chills, worsening cough. Chest x-[name] with possible pneumonitis versus other cause of interstitial lung disease with right pleural effusion. Borderline O2 sat 90-91%. Discussed with  pulmonary/critical care, recommended emergency room evaluation to decide on admission versus observation/further workup. Offered EMS transport but patient denies dyspnea at rest, plans to drive himself. 911 precautions were discussed, ER staff advised.   No orders of the defined types were placed in this encounter.  Patient Instructions    Based on your x-[name] and borderline low oxygen, it was recommended for further evaluation to the emergency room. Please proceed to [facility] ER after leaving our office. If follow-up needed after the emergency room, I'm happy to see you.    IF you received an x-[name] today, you will receive an invoice from [facility]. Please contact [facility] at [phone] with questions or concerns regarding your invoice.   IF you received labwork today, you will receive an invoice from [facility]. Please contact [facility] at [phone] with questions or concerns regarding your invoice.   Our billing staff will not be able to assist you with questions regarding bills from these companies.  You will be contacted with the lab results as soon as they are available. The fastest way to get your results is to activate your My Chart account. Instructions are located on the last page of this paperwork. If you have not heard from [place] regarding the results in 2 weeks, please contact this office.      I personally performed the services described in this documentation, which was scribed in my presence. The recorded information has been reviewed and considered for accuracy and completeness, addended by me as needed, and agree with information above.  Signed,   [name] Ray, MD Primary Care at [facility].  03/02/18 1:57 PM

## 2018-03-02 NOTE — ED Provider Notes (Signed)
[place] EMERGENCY DEPARTMENT Provider Note   CSN: [id number] Arrival date & time: 03/02/18  1512     History   Chief Complaint Chief Complaint  Patient presents with  . Pleural Effusion    HPI [name] [name] is a 52 y.o. male.  HPI  [name] [name] is a 52yo male with a history of IDDM, CKD stage IV, Bacteremia and Empyema from CAP (2013) who presents to the emergency department for evaluation of cough, shortness of breath and fever/chills.  Patient reports that he has had nonproductive cough for the past 2 days now with associated tactile fever and chills.  He reports that the cough seems to be worse when laying flat and wakes him up from sleep at night.  For the past 2 nights he has had to sleep upright.  States that he can hear a crackling sound while he is breathing.  Reports that he has also been short of breath, particularly on exertion.  Was winded after bringing his groceries up the stairs 2 days ago.  He reports intermittent leg swelling, unchanged from baseline.  Also states that he has felt somewhat congested over the past 2 weeks.  Has been taking over-the-counter Mucinex which has not been helping.  He denies sore throat, ear pain, headache, chest pain, wheezing, abdominal pain, nausea/vomiting, diarrhea, lightheadedness or syncope.  Denies history of immunocompromise.  Not currently on prednisone or immunosuppressant, denies history of HIV.  No history of DVT/PE, pleuritic chest pain, unilateral leg swelling, hemoptysis, exogenous testosterone, history of cancer.  Per chart review, patient was admitted for a month after being diagnosed with streptococcus pneumonia bacteremia. He had empyema which required VAT procedure. Patient states that he was cleared by pulmonology shortly after the admission and has not required follow-up.  Past Medical History:  Diagnosis Date  . Bacteremia   . Diabetes mellitus   . Hypertension   . Pneumonia 02/2012   Strep pneumoniae bilateral pneumonia complicated by bacteremia  . Renal disorder    End stage, not on dialysis    Patient Active Problem List   Diagnosis Date Noted  . AKI (acute kidney injury) ([place]) 01/29/2017  . Palpitations 01/13/2017  . Essential hypertension, benign 10/26/2013  . Dyspnea 06/09/2012  . Dizziness 06/09/2012  . Pneumonia, organism unspecified(486) 04/07/2012  . Pleural effusion 04/07/2012  . Parapneumonic effusion 03/28/2012  . Symptomatic anemia 03/28/2012  . Hypoalbuminemia 03/28/2012  . Total bilirubin, elevated 03/28/2012  . Bilateral leg edema 03/23/2012  . Cyst of skin 03/23/2012  . Bacteremia due to Streptococcus pneumoniae 03/15/2012  . Hyponatremia 03/15/2012  . Acute kidney failure 03/13/2012  . Elevated LFTs 03/13/2012  . Pneumococcal pneumonia ([place]) 03/12/2012  . Diabetes mellitus ([place]) 03/12/2012    Past Surgical History:  Procedure Laterality Date  . CHEST TUBE INSERTION     placed during hospitalization 02/2012  . COLONOSCOPY  04/01/2012   Procedure: COLONOSCOPY;  Surgeon: Juanita Craver, MD;  Location: [facility] ENDOSCOPY;  Service: Endoscopy;  Laterality: N/A;  . ESOPHAGOGASTRODUODENOSCOPY  03/31/2012   Procedure: ESOPHAGOGASTRODUODENOSCOPY (EGD);  Surgeon: Beryle Beams, MD;  Location: [facility] ENDOSCOPY;  Service: Endoscopy;  Laterality: N/A;       Home Medications    Prior to Admission medications   Medication Sig Start Date End Date Taking? Authorizing Provider  amLODipine (NORVASC) 10 MG tablet Take 1 tablet by mouth daily. 03/21/17   [provider]  aspirin 81 MG tablet Take 81 mg by mouth every morning.  [provider]  B Complex-C-Folic Acid (RENAL VITAMIN PO) Take by mouth.    [provider]  carvedilol (COREG) 25 MG tablet Take 1.5 tablets (37.5 mg total) by mouth 2 (two) times daily with a meal. 03/25/17   Troy Sine, MD  Continuous Blood Gluc Receiver (FREESTYLE LIBRE READER) DEVI 1 Device by Does not  apply route as directed. 03/31/17   Elayne Snare, MD  Continuous Blood Gluc Sensor (FREESTYLE LIBRE SENSOR SYSTEM) MISC Apply sensor to upper arm and change sensor every 10 days. 12/26/17   Elayne Snare, MD  furosemide (LASIX) 80 MG tablet Take 1 tablet by mouth 2 (two) times daily.  03/15/17   [provider]  insulin aspart (NOVOLOG FLEXPEN) 100 UNIT/ML FlexPen 5-15 units before meals 04/08/17   Elayne Snare, MD  Insulin Pen Needle 31G X 5 MM MISC Use to inject [place] once daily 03/28/17   Elayne Snare, MD  TRESIBA FLEXTOUCH 100 UNIT/ML SOPN FlexTouch Pen INJECT 0.38 MLS (38 UNITS TOTAL) INTO THE SKIN DAILY AT 10 PM. 05/16/17   Elayne Snare, MD  Vitamin D, Ergocalciferol, (DRISDOL) 50000 units CAPS capsule Take 50,000 Units by mouth every 7 (seven) days.    [provider]    Family History Family History  Problem Relation Age of Onset  . Diabetes Maternal Grandmother     Social History Social History   Tobacco Use  . Smoking status: Never Smoker  . Smokeless tobacco: Never Used  Substance Use Topics  . Alcohol use: Yes    Comment: rare   . Drug use: No     Allergies   Penicillins   Review of Systems Review of Systems  Constitutional: Positive for chills and fever.  HENT: Positive for congestion. Negative for ear pain, sore throat and trouble swallowing.   Eyes: Negative for visual disturbance.  Respiratory: Positive for cough and shortness of breath. Negative for wheezing.   Cardiovascular: Positive for leg swelling (intermittent). Negative for chest pain.  Gastrointestinal: Negative for abdominal pain, nausea and vomiting.  Genitourinary: Negative for dysuria and frequency.  Musculoskeletal: Negative for back pain and gait problem.  Skin: Negative for rash.  Neurological: Negative for syncope, light-headedness and headaches.  Psychiatric/Behavioral: Negative for agitation.     Physical Exam Updated Vital Signs BP (!) 151/84   Pulse 80   Temp 98.8 F (37.1  C) (Oral)   Resp 16   Wt 97.1 kg (214 lb)   SpO2 90%   BMI 27.48 kg/m   Physical Exam  Constitutional: He is oriented to person, place, and time. He appears well-developed and well-nourished. No distress.  HENT:  Head: Normocephalic and atraumatic.  Mouth/Throat: Oropharynx is clear and moist. No oropharyngeal exudate.  Mucous membranes moist.  Eyes: Conjunctivae are normal. Pupils are equal, round, and reactive to light. Right eye exhibits no discharge. Left eye exhibits no discharge.  Neck: Normal range of motion. Neck supple. No JVD present.  Cardiovascular: Normal rate, regular rhythm and intact distal pulses. Exam reveals no friction rub.  No murmur heard. Pulmonary/Chest: Effort normal. No respiratory distress.  O2 sat 84% on room air, placed on 3 L nasal cannula.  No respiratory distress. Speaking in full sentences.  Good air movement.  Crackles heard in left lung base.  No stridor, wheezes or rhonchi.  Abdominal: Soft. Bowel sounds are normal. There is no tenderness.  Musculoskeletal: Normal range of motion.  Bilateral lower extremities with 1+ pitting edema to the midshin.  DP pulses 2+  bilaterally.  Lymphadenopathy:    He has no cervical adenopathy.  Neurological: He is alert and oriented to person, place, and time. Coordination normal.  Skin: Skin is warm and dry. Capillary refill takes less than 2 seconds. He is not diaphoretic.  Psychiatric: He has a normal mood and affect. His behavior is normal.  Nursing note and vitals reviewed.   ED Treatments / Results  Labs (all labs ordered are listed, but only abnormal results are displayed) Labs Reviewed  CBC WITH DIFFERENTIAL/PLATELET - Abnormal; Notable for the following components:      Result Value   RBC 3.35 (*)    Hemoglobin 10.0 (*)    HCT 30.5 (*)    Neutro Abs 7.8 (*)    All other components within normal limits  CULTURE, BLOOD (ROUTINE X 2)  CULTURE, BLOOD (ROUTINE X 2)  BRAIN NATRIURETIC PEPTIDE    COMPREHENSIVE METABOLIC PANEL  I-STAT CG4 LACTIC ACID, ED    EKG  EKG Interpretation  Date/Time:  Monday March 02 2018 16:11:51 EDT Ventricular Rate:  76 PR Interval:  196 QRS Duration: 154 QT Interval:  482 QTC Calculation: 542 R Axis:   -5 Text Interpretation:  Normal sinus rhythm Left bundle branch block Abnormal ECG QRS is wider compared to Feb 2018 Confirmed by Sherwood Gambler [phone]) on 03/02/2018 4:53:14 PM       Radiology Dg Chest 2 View  Result Date: 03/02/2018 CLINICAL DATA:  Cough and chills. Left lower lobe rhonchi and rales. EXAM: CHEST - 2 VIEW COMPARISON:  Chest x-rays dated 10/26/2013 and 05/26/2012 and chest CT dated 06/05/2012 FINDINGS: Heart size is normal although increased since the prior exam. Pulmonary vascularity is normal. Patient has progressive interstitial disease in the right lung base and new slight interstitial disease at the left lung base. There is a new small right effusion. No significant bone abnormality. IMPRESSION: 1. Bibasilar interstitial lung disease, right greater than left, nonspecific. This could represent pneumonitis. The 2. New small right pleural effusion. 3. Slight increase in cardiac size although still within normal limits. Electronically Signed   By: Lorriane Shire M.D.   On: 03/02/2018 13:06    Procedures Procedures (including critical care time)  Medications Ordered in ED Medications  cefTRIAXone (ROCEPHIN) 1 g in sodium chloride 0.9 % 100 mL IVPB (not administered)  azithromycin (ZITHROMAX) 500 mg in sodium chloride 0.9 % 250 mL IVPB (not administered)  cefTRIAXone (ROCEPHIN) 1 g in sodium chloride 0.9 % 100 mL IVPB (not administered)  azithromycin (ZITHROMAX) 500 mg in sodium chloride 0.9 % 250 mL IVPB (not administered)     Initial Impression / Assessment and Plan / ED Course  I have reviewed the triage vital signs and the nursing notes.  Pertinent labs & imaging results that were available during my care of the patient  were reviewed by me and considered in my medical decision making (see chart for details).     Patient with a history of IDDM, CKD stage IV, streptococcus pneumonia with bacteremia and empyema requiring VATS procedure (2013) presents to the emergency department for evaluation of cough, fever/chills and shortness of breath.  This is been ongoing for the past 2 days now.    On exam, patient with O2 sat of 84% on room air. Placed on 3 L nasal cannula.  Crackles heard in left lung base.  Patient appears somewhat fluid overloaded on exam with 1+ pitting edema to the midshin.  He has a history of CKD stage IV, and states this  is his normal amount of swelling.  Chest x-ray reviewed which shows bibasilar interstitial lung disease, right greater than left, nonspecific.  Could represent pneumonitis.  He also has a small right pleural effusion.  No specific consolidation for pneumonia.  Lab work pending.   Plan to get blood cultures and treat potential CAP with IV Rocephin and Azithromycin given new xray findings, cough and hx of fever/chills. Discussed this patient with Dr. Regenia Skeeter who agrees. CBC without leukocytosis (hemoglobin 10.0 which is baseline.)  CMP reveals elevated creatinine 6.01 (baseline 5.8, patient with CKD IV.) Glucose elevated 237. No lactic acidosis. BNP elevated 748, likely with fluid overload from his CKD.   Do not suspect PE given no tachycardia, no history of dvt/pe, no pleuritic cp, no unilateral calf swelling or tenderness, no recent surgery or immobilization, no history of cancer.   Plan to admit for new oxygen requirement related to pneumonitis and new pleural effusion.  Discussed this patient with Dr. Myna Hidalgo who will admit the patient.  Patient and family at bedside informed.  Final Clinical Impressions(s) / ED Diagnoses   Final diagnoses:  Shortness of breath    ED Discharge Orders    None       Glyn Ade, PA-C 03/03/18 0106    Sherwood Gambler, MD 03/05/18  0700

## 2018-03-02 NOTE — Progress Notes (Signed)
Pharmacy Antibiotic Note  [name] [name] is a 52 y.o. male admitted on 03/02/2018 with pneumonia.  Pharmacy has been consulted for ceftriaxone and azithromycin dosing.  Plan: Start ceftriaxone 1g IV Q24h Start azithromycin 500mg  IV Q24h Monitor clinical picture, renal function  Rx will sign off   Weight: 214 lb (97.1 kg)  Temp (24hrs), Avg:98.5 F (36.9 C), Min:98.1 F (36.7 C), Max:98.8 F (37.1 C)  Recent Labs  Lab 03/02/18 1312  WBC 9.0    CrCl cannot be calculated (Patient's most recent lab result is older than the maximum 21 days allowed.).    Allergies  Allergen Reactions  . Penicillins Rash    Tolerating Ceftriaxone (03/12/12)    Thank you for allowing pharmacy to be a part of this patient's care.  Reginia Naas 03/02/2018 6:09 PM

## 2018-03-02 NOTE — ED Triage Notes (Signed)
Pt presents to the ed from Urgent Care for shortness of breath on exertion and cough, he had a breif period of low oxygen levels in the 80's and now it is back up to 94%.  The patient was diagnosed with pleural effusion at the urgent care. Denies any pain.

## 2018-03-02 NOTE — Patient Instructions (Addendum)
  Based on your x-ray and borderline low oxygen, it was recommended for further evaluation to the emergency room. Please proceed to [facility] ER after leaving our office. If follow-up needed after the emergency room, I'm happy to see you.    IF you received an x-ray today, you will receive an invoice from [facility]. Please contact [facility] at [phone] with questions or concerns regarding your invoice.   IF you received labwork today, you will receive an invoice from [facility]. Please contact [facility] at [phone] with questions or concerns regarding your invoice.   Our billing staff will not be able to assist you with questions regarding bills from these companies.  You will be contacted with the lab results as soon as they are available. The fastest way to get your results is to activate your My Chart account. Instructions are located on the last page of this paperwork. If you have not heard from [place] regarding the results in 2 weeks, please contact this office.

## 2018-03-02 NOTE — ED Notes (Signed)
Pt had xray today at urgent care in chart already.

## 2018-03-03 ENCOUNTER — Other Ambulatory Visit (HOSPITAL_COMMUNITY): Payer: 59

## 2018-03-03 DIAGNOSIS — R0602 Shortness of breath: Secondary | ICD-10-CM

## 2018-03-03 LAB — BASIC METABOLIC PANEL
Anion gap: 12 (ref 5–15)
BUN: 84 mg/dL — ABNORMAL HIGH (ref 6–20)
CO2: 27 mmol/L (ref 22–32)
Calcium: 7.2 mg/dL — ABNORMAL LOW (ref 8.9–10.3)
Chloride: 100 mmol/L — ABNORMAL LOW (ref 101–111)
Creatinine, Ser: 5.9 mg/dL — ABNORMAL HIGH (ref 0.61–1.24)
GFR calc Af Amer: 12 mL/min — ABNORMAL LOW (ref >60)
GFR calc non Af Amer: 10 mL/min — ABNORMAL LOW (ref >60)
Glucose, Bld: 189 mg/dL — ABNORMAL HIGH (ref 65–99)
Potassium: 4 mmol/L (ref 3.5–5.1)
Sodium: 139 mmol/L (ref 135–145)

## 2018-03-03 LAB — PROCALCITONIN
Procalcitonin: <0.1 ng/mL
Procalcitonin: <0.1 ng/mL

## 2018-03-03 LAB — CBG MONITORING, ED
Glucose-Capillary: 56 mg/dL — ABNORMAL LOW (ref 65–99)
Glucose-Capillary: 146 mg/dL — ABNORMAL HIGH (ref 65–99)
Glucose-Capillary: 254 mg/dL — ABNORMAL HIGH (ref 65–99)

## 2018-03-03 LAB — HIV ANTIBODY (ROUTINE TESTING W REFLEX): HIV Screen 4th Generation wRfx: NONREACTIVE

## 2018-03-03 LAB — STREP PNEUMONIAE URINARY ANTIGEN: Strep Pneumo Urinary Antigen: NEGATIVE

## 2018-03-03 MED ORDER — INSULIN GLARGINE 100 UNIT/ML ~~LOC~~ SOLN: 15.0000 [IU] | Freq: Every day | SUBCUTANEOUS | Status: DC

## 2018-03-03 MED ORDER — METOLAZONE 2.5 MG PO TABS: 2.5000 mg | ORAL_TABLET | ORAL | 0 refills | Status: DC

## 2018-03-03 MED ORDER — AZITHROMYCIN 250 MG PO TABS: ORAL_TABLET | ORAL | 0 refills | Status: AC

## 2018-03-03 MED ORDER — METOLAZONE 2.5 MG PO TABS
2.5000 mg | ORAL_TABLET | Freq: Once | ORAL | Status: AC
  Administered 2018-03-03: 2.5 mg via ORAL
  Filled 2018-03-03: qty 1

## 2018-03-03 NOTE — ED Notes (Signed)
Patient his breakfast tray stated it wasn't enough and he felt like he was "crashing". Cbg checked and it was 56. Patient is warm and dry, sprite and peanut butter crackers given, states he was feeling better.

## 2018-03-03 NOTE — ED Notes (Signed)
Kidney dr into see pt, dr Rockne Menghini here, pt to go home, pt ambulated with pulse ox with dr Adolphus Birchwood aware of #s 92-85 % ra. Pt denies any sob, able to ambulate around nursing station,

## 2018-03-03 NOTE — ED Notes (Signed)
Admitting dr Rockne Menghini into see pt

## 2018-03-03 NOTE — ED Notes (Signed)
Renal - Carb Modified diet lunch tray ordered at 1154.

## 2018-03-03 NOTE — ED Notes (Signed)
Patient states he is feeling much better.

## 2018-03-03 NOTE — ED Notes (Signed)
Pt educated on collection of sputum culture.

## 2018-03-03 NOTE — ED Notes (Signed)
Pt aaox4 await admit bed, given some blue socks and extra urinals, room cleaned per housekeeping

## 2018-03-03 NOTE — Discharge Planning (Signed)
Pt has [employer] (non Medicare) and therefore will not be in code 44 status.

## 2018-03-03 NOTE — Discharge Summary (Signed)
Physician Discharge Summary  [name] [name] AXE:[id number] DOB: 1966-07-09 DOA: 03/02/2018  PCP: Christain Sacramento, MD  Admit date: 03/02/2018 Discharge date: 03/03/2018  Admitted From: Home Discharge disposition: Home   Recommendations for Outpatient Follow-Up:   1. Patient has scheduled follow-up with his nephrologist on 03/09/18. 2. Follow-up sputum culture, strep pneumonia/legionella urinary antigens.    Discharge Diagnosis:   Principal Problem:   Possible CAP (community acquired pneumonia) Active Problems:   Insulin-requiring or dependent type II diabetes mellitus ([place])   Essential hypertension, benign   AKI (acute kidney injury) ([place])   CKD (chronic kidney disease), stage V (HCC)   Acute on chronic diastolic CHF (congestive heart failure) (HCC)   Normocytic anemia  Discharge Condition: Improved.  Diet recommendation: Low sodium, heart healthy.  Carbohydrate-modified.    Code status: Full.   History of Present Illness:   [name] [name] is an 52 y.o. male with a PMH of stage IV CK D, chronic diastolic CHF, hypertension, insulin-dependent diabetes who was admitted 03/02/18 for evaluation of a 2 day history of shortness of breath associated with cough, chills and orthopnea. In the ED, he was noted to be hypoxic with room air oxygen saturation in the high 80s. EKG showed sinus rhythm with a left bundle branch block and chest x-ray showed bibasilar interstitial lung disease and a right small pleural effusion. He was given 160 mg of Lasix, Rocephin, and azithromycin and placed on supplemental oxygen.  Hospital Course by Problem:   Principal Problem:    Acute hypoxic respiratory failure secondary to end-stage renal disease/acute on chronic diastolic CHF versus community-acquired pneumonia Influenza panel negative. Pro calcitonin/lactic acid not elevated. Treated with empiric Rocephin/azithromycin. Follow-up sputum culture, strep pneumonia/legionella urinary  antigens. Suspect acute respiratory failure with hypoxia is more from volume issues in the setting of end-stage renal disease but will continue a Z-Pak at discharge due to diagnostic uncertainty. The patient has close outpatient follow-up as well. To address volume issues, he was given 160 mg of IV Lasix on admission that continued on 100 mg of IV Lasix twice a day. He was given a dose of Zaroxolyn prior to discharge and will be discharged home on 2.5 mg of Zaroxolyn every other day in addition to his usual dose of Lasix.   Active Problems:   Insulin-requiring or dependent type II diabetes mellitus ([place]) Currently being managed with 25 units of Lantus and insulin sensitive SSI. CBGs 56-157. Decreased Lantus to 15 units. Will resume usual home regimen at discharge.    Essential hypertension, benign Continue amlodipine, Coreg, Lasix.    CKD (chronic kidney disease), stage V (HCC)/anemia of chronic kidney disease Baseline creatinine appears to be 5.2-5.8. Current creatinine consistent with usual baseline values. Evaluated by his nephrologist who felt he could safely follow-up in one week. Likely heading towards the need for hemodialysis.    Acute on chronic diastolic CHF (congestive heart failure) (HCC) Takes 160 mg of oral Lasix twice a day at home. Given 160 mg IV Lasix in the ED and currently on Lasix 100 mg IV every 12 hours. Zaroxolyn added at discharge.   Medical Consultants:    Nephrology  Discharge Exam:   Vitals:   03/03/18 1200 03/03/18 1352  BP: (!) 157/88 (!) 150/89  Pulse: 74   Resp:  20  Temp:    SpO2: 97% 90%   Vitals:   03/03/18 1034 03/03/18 1100 03/03/18 1200 03/03/18 1352  BP: (!) 159/87 (!) 155/85 (!) 157/88 (!) 150/89  Pulse:  75 79 74   Resp: 18   20  Temp:      TempSrc:      SpO2: 95% 96% 97% 90%  Weight:        General exam: Appears calm and comfortable.  Respiratory system: Bibasilar rales. Dyspnea on exertion. No wheezes. Cardiovascular system:  S1 & S2 heard, RRR. No JVD,  rubs, gallops or clicks. No murmurs. Gastrointestinal system: Abdomen is nondistended, soft and nontender. No organomegaly or masses felt. Normal bowel sounds heard. Central nervous system: Alert and oriented. No focal neurological deficits. Extremities: No clubbing,  or cyanosis. No edema. Skin: No rashes, lesions or ulcers. Psychiatry: Judgement and insight appear normal. Mood & affect appropriate.    The results of significant diagnostics from this hospitalization (including imaging, microbiology, ancillary and laboratory) are listed below for reference.     Procedures and Diagnostic Studies:   Dg Chest 2 View  Result Date: 03/02/2018 CLINICAL DATA:  Cough and chills. Left lower lobe rhonchi and rales. EXAM: CHEST - 2 VIEW COMPARISON:  Chest x-rays dated 10/26/2013 and 05/26/2012 and chest CT dated 06/05/2012 FINDINGS: Heart size is normal although increased since the prior exam. Pulmonary vascularity is normal. Patient has progressive interstitial disease in the right lung base and new slight interstitial disease at the left lung base. There is a new small right effusion. No significant bone abnormality. IMPRESSION: 1. Bibasilar interstitial lung disease, right greater than left, nonspecific. This could represent pneumonitis. The 2. New small right pleural effusion. 3. Slight increase in cardiac size although still within normal limits. Electronically Signed   By: Lorriane Shire M.D.   On: 03/02/2018 13:06     Labs:   Basic Metabolic Panel: Recent Labs  Lab 03/02/18 1820 03/03/18 0226  NA 138 139  K 4.6 4.0  CL 100* 100*  CO2 25 27  GLUCOSE 237* 189*  BUN 88* 84*  CREATININE 6.01* 5.90*  CALCIUM 7.3* 7.2*   GFR Estimated Creatinine Clearance: 17.2 mL/min (A) (by C-G formula based on SCr of 5.9 mg/dL (H)). Liver Function Tests: Recent Labs  Lab 03/02/18 1820  AST 20  ALT 20  ALKPHOS 78  BILITOT 0.9  PROT 6.8  ALBUMIN 3.2*   CBC: Recent  Labs  Lab 03/02/18 1312 03/02/18 1820  WBC 9.0 10.1  NEUTROABS  --  7.8*  HGB 10.3* 10.0*  HCT 31.1* 30.5*  MCV 0.0* 91.0  PLT  --  165   CBG: Recent Labs  Lab 03/02/18 2328 03/03/18 0723 03/03/18 0835 03/03/18 1219  GLUCAP 167* 56* 146* 254*   Microbiology Recent Results (from the past 240 hour(s))  Blood Culture (routine x 2)     Status: None (Preliminary result)   Collection Time: 03/02/18  6:20 PM  Result Value Ref Range Status   Specimen Description BLOOD LEFT WRIST  Final   Special Requests   Final    BOTTLES DRAWN AEROBIC AND ANAEROBIC Blood Culture adequate volume   Culture   Final    NO GROWTH < 24 HOURS Performed at [facility], [place] [address] [place], [place] [address]    Report Status PENDING  Incomplete  Blood Culture (routine x 2)     Status: None (Preliminary result)   Collection Time: 03/02/18  6:44 PM  Result Value Ref Range Status   Specimen Description BLOOD RIGHT ANTECUBITAL  Final   Special Requests   Final    BOTTLES DRAWN AEROBIC AND ANAEROBIC Blood Culture adequate volume  Culture   Final    NO GROWTH < 24 HOURS Performed at [facility], [place] [address], [place], [place] [address]    Report Status PENDING  Incomplete     Discharge Instructions:   Discharge Instructions    Call MD for:  difficulty breathing, headache or visual disturbances   Complete by:  As directed    Call MD for:  extreme fatigue   Complete by:  As directed    Call MD for:  persistant dizziness or light-headedness   Complete by:  As directed    Diet - low sodium heart healthy   Complete by:  As directed    Discharge instructions   Complete by:  As directed    If your breathing becomes worse, take an extra dose of Lasix. Make sure you follow-up with Dr. Joelyn Oms on 03/09/18.   Increase activity slowly   Complete by:  As directed      Allergies as of 03/03/2018      Reactions   Penicillins Rash   Tolerating Ceftriaxone (03/12/12) Has patient had a  PCN reaction causing immediate rash, facial/tongue/throat swelling, SOB or lightheadedness with hypotension: Yes Has patient had a PCN reaction causing severe rash involving mucus membranes or skin necrosis: Unk Has patient had a PCN reaction that required hospitalization: No Has patient had a PCN reaction occurring within the last 10 years: No If all of the above answers are "NO", then may proceed with Cephalosporin use.      Medication List    TAKE these medications   amLODipine 10 MG tablet Commonly known as:  NORVASC Take 10 mg by mouth daily.   aspirin 81 MG tablet Take 81 mg by mouth every morning.   azithromycin 250 MG tablet Commonly known as:  ZITHROMAX Z-PAK Take 2 tablets (500 mg) on  Day 1,  followed by 1 tablet (250 mg) once daily on Days 2 through 5.   carvedilol 25 MG tablet Commonly known as:  COREG Take 1.5 tablets (37.5 mg total) by mouth 2 (two) times daily with a meal.   Darbepoetin Alfa 100 MCG/0.5ML Sosy injection Commonly known as:  ARANESP Inject 100 mcg into the skin every 28 (twenty-eight) days.   dextromethorphan-guaiFENesin 30-600 MG 12hr tablet Commonly known as:  MUCINEX DM Take 1 tablet by mouth 2 (two) times daily as needed for cough.   FREESTYLE LIBRE READER Devi 1 Device by Does not apply route as directed.   FREESTYLE LIBRE SENSOR SYSTEM Misc Apply sensor to upper arm and change sensor every 10 days.   furosemide 80 MG tablet Commonly known as:  LASIX Take 160 mg by mouth 2 (two) times daily.   insulin aspart 100 UNIT/ML FlexPen Commonly known as:  NOVOLOG FLEXPEN 5-15 units before meals What changed:    how much to take  how to take this  when to take this  additional instructions   Insulin Pen Needle 31G X 5 MM Misc Use to inject [place] once daily   metolazone 2.5 MG tablet Commonly known as:  ZAROXOLYN Take 1 tablet (2.5 mg total) by mouth every other day.   RENAL VITAMIN PO Take 1 tablet by mouth daily.     TRESIBA FLEXTOUCH 100 UNIT/ML Sopn FlexTouch Pen Generic drug:  insulin degludec INJECT 0.38 MLS (38 UNITS TOTAL) INTO THE SKIN DAILY AT 10 PM. What changed:  when to take this   Vitamin D (Ergocalciferol) 50000 units Caps capsule Commonly known as:  DRISDOL Take  50,000 Units by mouth every Sunday.      Follow-up Information    Christain Sacramento, MD. Schedule an appointment as soon as possible for a visit in 1 week(s).   Specialty:  Family Medicine Why:  Hospital follow-up Contact information: [place] [address] [phone]        Rexene Agent, MD Follow up on 03/09/2018.   Specialty:  Nephrology Why:  At your scheduled appointment time Contact information: [place] [address] [phone]            Time coordinating discharge: 35 minutes.  SignedMargreta Journey Marja Adderley  Pager [phone] [facility] 03/03/2018, 2:30 PM

## 2018-03-03 NOTE — ED Notes (Signed)
Dr[place] [phone]-per RN-paged by Levada Dy

## 2018-03-04 LAB — LEGIONELLA PNEUMOPHILA SEROGP 1 UR AG: L. pneumophila Serogp 1 Ur Ag: NEGATIVE

## 2018-03-04 LAB — UREA NITROGEN, URINE: Urea Nitrogen, Ur: 436 mg/dL

## 2018-03-06 ENCOUNTER — Encounter (HOSPITAL_COMMUNITY)
Admission: RE | Admit: 2018-03-06 | Discharge: 2018-03-06 | Disposition: A | Discharge status: Home / Self Care | Payer: 59 | Source: Ambulatory Visit | Attending: Nephrology | Admitting: Nephrology

## 2018-03-06 VITALS — BP 142/70 | HR 79 | Temp 98.3°F | Resp 18

## 2018-03-06 DIAGNOSIS — N184 Chronic kidney disease, stage 4 (severe): Secondary | ICD-10-CM | POA: Insufficient documentation

## 2018-03-06 DIAGNOSIS — D649 Anemia, unspecified: Secondary | ICD-10-CM

## 2018-03-06 DIAGNOSIS — Z79899 Other long term (current) drug therapy: Secondary | ICD-10-CM | POA: Diagnosis not present

## 2018-03-06 DIAGNOSIS — Z5181 Encounter for therapeutic drug level monitoring: Secondary | ICD-10-CM | POA: Insufficient documentation

## 2018-03-06 DIAGNOSIS — D631 Anemia in chronic kidney disease: Secondary | ICD-10-CM | POA: Insufficient documentation

## 2018-03-06 LAB — RENAL FUNCTION PANEL
Albumin: 2.8 g/dL — ABNORMAL LOW (ref 3.5–5.0)
Anion gap: 16 — ABNORMAL HIGH (ref 5–15)
BUN: 109 mg/dL — ABNORMAL HIGH (ref 6–20)
CO2: 24 mmol/L (ref 22–32)
Calcium: 6.5 mg/dL — ABNORMAL LOW (ref 8.9–10.3)
Chloride: 95 mmol/L — ABNORMAL LOW (ref 101–111)
Creatinine, Ser: 6.99 mg/dL — ABNORMAL HIGH (ref 0.61–1.24)
GFR calc Af Amer: 9 mL/min — ABNORMAL LOW (ref >60)
GFR calc non Af Amer: 8 mL/min — ABNORMAL LOW (ref >60)
Glucose, Bld: 208 mg/dL — ABNORMAL HIGH (ref 65–99)
Phosphorus: 7 mg/dL — ABNORMAL HIGH (ref 2.5–4.6)
Potassium: 4.7 mmol/L (ref 3.5–5.1)
Sodium: 135 mmol/L (ref 135–145)

## 2018-03-06 LAB — POCT HEMOGLOBIN-HEMACUE: Hemoglobin: 9.1 g/dL — ABNORMAL LOW (ref 13.0–17.0)

## 2018-03-06 LAB — IRON AND TIBC
Iron: 24 ug/dL — ABNORMAL LOW (ref 45–182)
Saturation Ratios: 12 % — ABNORMAL LOW (ref 17.9–39.5)
TIBC: 196 ug/dL — ABNORMAL LOW (ref 250–450)
UIBC: 172 ug/dL

## 2018-03-06 LAB — FERRITIN: Ferritin: 638 ng/mL — ABNORMAL HIGH (ref 24–336)

## 2018-03-06 MED ORDER — DARBEPOETIN ALFA 100 MCG/0.5ML IJ SOSY
PREFILLED_SYRINGE | INTRAMUSCULAR | Status: AC
  Filled 2018-03-06: qty 0.5

## 2018-03-06 MED ORDER — DARBEPOETIN ALFA 100 MCG/0.5ML IJ SOSY
100.0000 ug | PREFILLED_SYRINGE | INTRAMUSCULAR | Status: DC
  Administered 2018-03-06: 100 ug via SUBCUTANEOUS

## 2018-03-07 LAB — PTH, INTACT AND CALCIUM
Calcium, Total (PTH): 6.5 mg/dL (ref 8.7–10.2)
PTH: 86 pg/mL — ABNORMAL HIGH (ref 15–65)

## 2018-03-07 LAB — CULTURE, BLOOD (ROUTINE X 2)
Culture: NO GROWTH
Culture: NO GROWTH
Special Requests: ADEQUATE
Special Requests: ADEQUATE

## 2018-03-09 DIAGNOSIS — N185 Chronic kidney disease, stage 5: Secondary | ICD-10-CM | POA: Diagnosis not present

## 2018-03-09 DIAGNOSIS — D631 Anemia in chronic kidney disease: Secondary | ICD-10-CM | POA: Diagnosis not present

## 2018-03-09 DIAGNOSIS — I120 Hypertensive chronic kidney disease with stage 5 chronic kidney disease or end stage renal disease: Secondary | ICD-10-CM | POA: Diagnosis not present

## 2018-03-13 DIAGNOSIS — D631 Anemia in chronic kidney disease: Secondary | ICD-10-CM | POA: Diagnosis not present

## 2018-03-13 DIAGNOSIS — N185 Chronic kidney disease, stage 5: Secondary | ICD-10-CM | POA: Diagnosis not present

## 2018-03-16 DIAGNOSIS — E875 Hyperkalemia: Secondary | ICD-10-CM | POA: Diagnosis not present

## 2018-03-17 DIAGNOSIS — N185 Chronic kidney disease, stage 5: Secondary | ICD-10-CM | POA: Diagnosis not present

## 2018-03-17 DIAGNOSIS — D631 Anemia in chronic kidney disease: Secondary | ICD-10-CM | POA: Diagnosis not present

## 2018-03-17 DIAGNOSIS — I120 Hypertensive chronic kidney disease with stage 5 chronic kidney disease or end stage renal disease: Secondary | ICD-10-CM | POA: Diagnosis not present

## 2018-03-26 ENCOUNTER — Other Ambulatory Visit: Payer: Self-pay | Admitting: *Deleted

## 2018-03-26 DIAGNOSIS — I872 Venous insufficiency (chronic) (peripheral): Secondary | ICD-10-CM | POA: Diagnosis not present

## 2018-03-26 DIAGNOSIS — M2010 Hallux valgus (acquired), unspecified foot: Secondary | ICD-10-CM | POA: Diagnosis not present

## 2018-03-26 MED ORDER — FREESTYLE LIBRE SENSOR SYSTEM MISC: 3 refills | Status: DC

## 2018-03-26 NOTE — Telephone Encounter (Signed)
Pt walked into office requesting refill on his 10 day Libre sensors. Refill sent. See meds. Pt informed

## 2018-04-03 ENCOUNTER — Other Ambulatory Visit (HOSPITAL_COMMUNITY): Payer: Self-pay | Admitting: *Deleted

## 2018-04-03 ENCOUNTER — Encounter (HOSPITAL_COMMUNITY): Payer: 59

## 2018-04-06 ENCOUNTER — Inpatient Hospital Stay (HOSPITAL_COMMUNITY)
Admission: RE | Admit: 2018-04-06 | Discharge: 2018-04-06 | Disposition: A | Discharge status: Home / Self Care | Payer: 59 | Source: Ambulatory Visit | Attending: Nephrology | Admitting: Nephrology

## 2018-04-13 ENCOUNTER — Other Ambulatory Visit: Payer: Self-pay

## 2018-04-13 ENCOUNTER — Encounter: Payer: Self-pay | Admitting: Family Medicine

## 2018-04-13 ENCOUNTER — Ambulatory Visit: Payer: 59 | Admitting: Family Medicine

## 2018-04-13 DIAGNOSIS — R109 Unspecified abdominal pain: Secondary | ICD-10-CM | POA: Diagnosis not present

## 2018-04-13 DIAGNOSIS — G44319 Acute post-traumatic headache, not intractable: Secondary | ICD-10-CM

## 2018-04-13 LAB — POC MICROSCOPIC URINALYSIS (UMFC): Mucus: ABSENT

## 2018-04-13 LAB — POCT URINALYSIS DIP (MANUAL ENTRY)
Bilirubin, UA: NEGATIVE
Glucose, UA: 500 mg/dL — AB
Ketones, POC UA: NEGATIVE mg/dL
Leukocytes, UA: NEGATIVE
Nitrite, UA: NEGATIVE
Protein Ur, POC: >=300 mg/dL — AB
Spec Grav, UA: 1.02 (ref 1.010–1.025)
Urobilinogen, UA: 0.2 E.U./dL
pH, UA: 6 (ref 5.0–8.0)
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
## 2018-04-13 NOTE — Progress Notes (Signed)
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 direct hit (blow) to the head or body.  A concussion may also be called a mild traumatic brain injury (TBI).  You may have imaging tests and neuropsychological tests to diagnose a concussion.  This condition is treated with physical and mental rest and careful observation.  Ask your health care provider when you can return to your normal activities, such as school, work, athletics, driving, riding a bicycle, or using heavy machinery. Follow safety instructions as told by your health care provider. This information is not intended to replace advice given to you by your health care provider. Make sure you discuss any questions you have with your health care provider. Document Released: 02/29/2004 Document Revised: 11/19/2016 Document Reviewed: 11/19/2016 Elsevier  Interactive Patient Education  2018 [employer].     IF you received an x-ray today, you will receive an invoice from [facility]. Please contact [facility] at [phone] with questions or concerns regarding your invoice.   IF you received labwork today, you will receive an invoice from [facility]. Please contact [facility] at [phone] with questions or concerns regarding your invoice.   Our billing staff will not be able to assist you with questions regarding bills from these companies.  You will be contacted with the lab results as soon as they are available. The fastest way to get your results is to activate your My Chart account. Instructions are located on the last page of this paperwork. If you have not heard from [place] regarding the results in 2 weeks, please contact this office.       I personally performed the services described in this documentation, which was scribed in my presence. The recorded information has been reviewed and considered for accuracy and completeness, addended by me as needed, and agree with information above.  Signed,   [name], MD [facility].  04/15/18 2:51 PM

## 2018-04-15 ENCOUNTER — Encounter: Payer: Self-pay | Admitting: Family Medicine

## 2018-04-20 ENCOUNTER — Ambulatory Visit (INDEPENDENT_AMBULATORY_CARE_PROVIDER_SITE_OTHER): Payer: 59

## 2018-04-20 ENCOUNTER — Other Ambulatory Visit: Payer: Self-pay

## 2018-04-20 ENCOUNTER — Ambulatory Visit: Payer: 59 | Admitting: Family Medicine

## 2018-04-20 ENCOUNTER — Encounter: Payer: Self-pay | Admitting: Family Medicine

## 2018-04-20 DIAGNOSIS — G44309 Post-traumatic headache, unspecified, not intractable: Secondary | ICD-10-CM | POA: Diagnosis not present

## 2018-04-20 DIAGNOSIS — R3129 Other microscopic hematuria: Secondary | ICD-10-CM

## 2018-04-20 DIAGNOSIS — F4320 Adjustment disorder, unspecified: Secondary | ICD-10-CM

## 2018-04-20 DIAGNOSIS — M5431 Sciatica, right side: Secondary | ICD-10-CM

## 2018-04-20 DIAGNOSIS — M545 Low back pain: Secondary | ICD-10-CM | POA: Diagnosis not present

## 2018-04-20 DIAGNOSIS — S3992XA Unspecified injury of lower back, initial encounter: Secondary | ICD-10-CM | POA: Diagnosis not present

## 2018-04-20 LAB — POC MICROSCOPIC URINALYSIS (UMFC): Mucus: ABSENT

## 2018-04-20 LAB — POCT URINALYSIS DIP (MANUAL ENTRY)
Bilirubin, UA: NEGATIVE
Glucose, UA: 500 mg/dL — AB
Ketones, POC UA: NEGATIVE mg/dL
Leukocytes, UA: NEGATIVE
Nitrite, UA: NEGATIVE
Protein Ur, POC: >=300 mg/dL — AB
Spec Grav, UA: 1.02 (ref 1.010–1.025)
Urobilinogen, UA: 0.2 E.U./dL
pH, UA: 5.5 (ref 5.0–8.0)

## 2018-04-20 MED ORDER — CYCLOBENZAPRINE HCL 5 MG PO TABS: ORAL_TABLET | ORAL | 0 refills | Status: DC
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
## 2018-04-20 NOTE — Patient Instructions (Addendum)
I will check for infection in the urine, but if any new urinary symptoms, let me know right away as I did not start antibiotics today.   Try flexeril for spasm at night, tylenol during the day and stretches/range of motion as we discussed.   See info below on sciatica as well as adjustment disorder. Recheck in next 2 weeks. Return to the clinic or go to the nearest emergency room if any of your symptoms worsen or new symptoms occur.   Sciatica Sciatica is pain, numbness, weakness, or tingling along the path of the sciatic nerve. The sciatic nerve starts in the lower back and runs down the back of each leg. The nerve controls the muscles in the lower leg and in the back of the knee. It also provides feeling (sensation) to the back of the thigh, the lower leg, and the sole of the foot. Sciatica is a symptom of another medical condition that pinches or puts pressure on the sciatic nerve. Generally, sciatica only affects one side of the body. Sciatica usually goes away on its own or with treatment. In some cases, sciatica may keep coming back (recur). What are the causes? This condition is caused by pressure on the sciatic nerve, or pinching of the sciatic nerve. This may be the result of:  A disk in between the bones of the spine (vertebrae) bulging out too far (herniated disk).  Age-related changes in the spinal disks (degenerative disk disease).  A pain disorder that affects a muscle in the buttock (piriformis syndrome).  Extra bone growth (bone spur) near the sciatic nerve.  An injury or break (fracture) of the pelvis.  Pregnancy.  Tumor (rare).  What increases the risk? The following factors may make you more likely to develop this condition:  Playing sports that place pressure or stress on the spine, such as football or weight lifting.  Having poor strength and flexibility.  A history of back injury.  A history of back surgery.  Sitting for long periods of time.  Doing  activities that involve repetitive bending or lifting.  Obesity.  What are the signs or symptoms? Symptoms can vary from mild to very severe, and they may include:  Any of these problems in the lower back, leg, hip, or buttock: ? Mild tingling or dull aches. ? Burning sensations. ? Sharp pains.  Numbness in the back of the calf or the sole of the foot.  Leg weakness.  Severe back pain that makes movement difficult.  These symptoms may get worse when you cough, sneeze, or laugh, or when you sit or stand for long periods of time. Being overweight may also make symptoms worse. In some cases, symptoms may recur over time. How is this diagnosed? This condition may be diagnosed based on:  Your symptoms.  A physical exam. Your health care provider may ask you to do certain movements to check whether those movements trigger your symptoms.  You may have tests, including: ? Blood tests. ? X-rays. ? MRI. ? CT scan.  How is this treated? In many cases, this condition improves on its own, without any treatment. However, treatment may include:  Reducing or modifying physical activity during periods of pain.  Exercising and stretching to strengthen your abdomen and improve the flexibility of your spine.  Icing and applying heat to the affected area.  Medicines that help: ? To relieve pain and swelling. ? To relax your muscles.  Injections of medicines that help to relieve pain, irritation, and inflammation  around the sciatic nerve (steroids).  Surgery.  Follow these instructions at home: Medicines  Take over-the-counter and prescription medicines only as told by your health care provider.  Do not drive or operate heavy machinery while taking prescription pain medicine. Managing pain  If directed, apply ice to the affected area. ? Put ice in a plastic bag. ? Place a towel between your skin and the bag. ? Leave the ice on for 20 minutes, 2-3 times a day.  After icing,  apply heat to the affected area before you exercise or as often as told by your health care provider. Use the heat source that your health care provider recommends, such as a moist heat pack or a heating pad. ? Place a towel between your skin and the heat source. ? Leave the heat on for 20-30 minutes. ? Remove the heat if your skin turns bright red. This is especially important if you are unable to feel pain, heat, or cold. You may have a greater risk of getting burned. Activity  Return to your normal activities as told by your health care provider. Ask your health care provider what activities are safe for you. ? Avoid activities that make your symptoms worse.  Take brief periods of rest throughout the day. Resting in a lying or standing position is usually better than sitting to rest. ? When you rest for longer periods, mix in some mild activity or stretching between periods of rest. This will help to prevent stiffness and pain. ? Avoid sitting for long periods of time without moving. Get up and move around at least one time each hour.  Exercise and stretch regularly, as told by your health care provider.  Do not lift anything that is heavier than 10 lb (4.5 kg) while you have symptoms of sciatica. When you do not have symptoms, you should still avoid heavy lifting, especially repetitive heavy lifting.  When you lift objects, always use proper lifting technique, which includes: ? Bending your knees. ? Keeping the load close to your body. ? Avoiding twisting. General instructions  Use good posture. ? Avoid leaning forward while sitting. ? Avoid hunching over while standing.  Maintain a healthy weight. Excess weight puts extra stress on your back and makes it difficult to maintain good posture.  Wear supportive, comfortable shoes. Avoid wearing high heels.  Avoid sleeping on a mattress that is too soft or too hard. A mattress that is firm enough to support your back when you sleep may  help to reduce your pain.  Keep all follow-up visits as told by your health care provider. This is important. Contact a health care provider if:  You have pain that wakes you up when you are sleeping.  You have pain that gets worse when you lie down.  Your pain is worse than you have experienced in the past.  Your pain lasts longer than 4 weeks.  You experience unexplained weight loss. Get help right away if:  You lose control of your bowel or bladder (incontinence).  You have: ? Weakness in your lower back, pelvis, buttocks, or legs that gets worse. ? Redness or swelling of your back. ? A burning sensation when you urinate. This information is not intended to replace advice given to you by your health care provider. Make sure you discuss any questions you have with your health care provider. Document Released: 12/03/2001 Document Revised: 05/14/2016 Document Reviewed: 08/18/2015 Elsevier Interactive Patient Education  2018 [employer].  Adjustment Disorder, Adult Adjustment  disorder is a group of symptoms that can develop after a stressful life event, such as the loss of a job or serious physical illness. The symptoms can affect how you feel, think, and act. They may interfere with your relationships. Adjustment disorder increases your risk of suicide and substance abuse. If this disorder is not managed early, it can develop into a more serious condition, such as major depressive disorder or post-traumatic stress disorder. What are the causes? This condition happens when you have trouble recovering from or coping with a stressful life event. What increases the risk? You are more likely to develop this condition if:  You have had depression or anxiety.  You are being treated for a long-term (chronic) illness.  You are being treated for an illness that cannot be cured (terminal illness).  You have a family history of mental illness.  What are the signs or  symptoms? Symptoms of this condition include:  Extreme trouble doing daily tasks, such as going to work.  Sadness, depression, or crying spells.  Worrying a lot.  Loss of enjoyment.  Change in appetite or weight.  Feelings of loss or hopelessness.  Thoughts of suicide.  Anxiety, worry, or nervousness.  Trouble sleeping.  Avoiding family and friends.  Fighting or vandalism.  Complaining of feeling sick without being ill.  Feeling dazed or disconnected.  Nightmares.  Trouble sleeping.  Irritability.  Reckless driving.  Poor work Systems analyst.  Ignoring bills.  Symptoms of this condition start within three months of the stressful event. They do not last more than six months, unless the stressful circumstances last longer. Normal grieving after the death of a loved one is not a symptom of this condition. How is this diagnosed? To diagnose this condition, your health care provider will ask about what has happened in your life and how it has affected you. He or she may also ask about your medical history and your use of medicines, alcohol, and other substances. Your health care provider may do a physical exam and order lab tests or other studies. You may be referred to a mental health specialist. How is this treated? Treatment options for this condition include:  Counseling or talk therapy. Talk therapy is usually provided by mental health specialists.  Medicines. Certain medicines may help with depression, anxiety, and sleep.  Support groups. These offer emotional support, advice, and guidance. They are made up of people who have had similar experiences.  Observation and time. This is sometimes called "watchful waiting." In this treatment, health care providers monitor your health and behavior without other treatment. Adjustment disorder sometimes gets better on its own with time.  Follow these instructions at home:  Take over-the-counter and prescription medicines  only as told by your health care provider.  Keep all follow-up visits as told by your health care provider. This is important. Contact a health care provider if:  Your symptoms do not improve in six months.  Your symptoms get worse. Get help right away if:  You have serious thoughts about hurting yourself or someone else. If you ever feel like you may hurt yourself or others, or have thoughts about taking your own life, get help right away. You can go to your nearest emergency department or call:  Your local emergency services (911 in the [place]  A suicide crisis helpline, such as the [facility] at 506-063-3143. This is open 24 hours a day.  Summary  Adjustment disorder is a group of symptoms that can  develop after a stressful life event, such as the loss of a job or serious physical illness. The symptoms can affect how you feel, think, and act. They may interfere with your relationships.  Symptoms of this condition start within three months of the stressful event. They do not last more than six months, unless the stressful circumstances last longer.  Treatment may include talk therapy, medicines, participation in a support group, or observation to see if symptoms improve.  Contact your health care provider if your symptoms get worse or do not improve in six months.  If you ever feel like you may hurt yourself or others, or have thoughts about taking your own life, get help right away. This information is not intended to replace advice given to you by your health care provider. Make sure you discuss any questions you have with your health care provider. Document Released: 08/13/2006 Document Revised: 02/07/2017 Document Reviewed: 02/07/2017 Elsevier Interactive Patient Education  2018 [employer].    IF you received an x-ray today, you will receive an invoice from [facility]. Please contact [facility] at [phone] with questions  or concerns regarding your invoice.   IF you received labwork today, you will receive an invoice from [facility]. Please contact [facility] at [phone] with questions or concerns regarding your invoice.   Our billing staff will not be able to assist you with questions regarding bills from these companies.  You will be contacted with the lab results as soon as they are available. The fastest way to get your results is to activate your My Chart account. Instructions are located on the last page of this paperwork. If you have not heard from [place] regarding the results in 2 weeks, please contact this office.

## 2018-04-21 LAB — URINE CULTURE: Organism ID, Bacteria: NO GROWTH

## 2018-04-22 ENCOUNTER — Encounter: Payer: Self-pay | Admitting: Family Medicine

## 2018-04-24 ENCOUNTER — Ambulatory Visit (HOSPITAL_COMMUNITY)
Admission: RE | Admit: 2018-04-24 | Discharge: 2018-04-24 | Disposition: A | Discharge status: Home / Self Care | Payer: 59 | Source: Ambulatory Visit | Attending: Nephrology | Admitting: Nephrology

## 2018-04-24 VITALS — BP 134/86 | HR 67 | Temp 98.6°F | Resp 18

## 2018-04-24 DIAGNOSIS — N184 Chronic kidney disease, stage 4 (severe): Secondary | ICD-10-CM | POA: Diagnosis not present

## 2018-04-24 DIAGNOSIS — D631 Anemia in chronic kidney disease: Secondary | ICD-10-CM | POA: Diagnosis not present

## 2018-04-24 DIAGNOSIS — D649 Anemia, unspecified: Secondary | ICD-10-CM

## 2018-04-24 LAB — RENAL FUNCTION PANEL
Albumin: 3.4 g/dL — ABNORMAL LOW (ref 3.5–5.0)
Anion gap: 11 (ref 5–15)
BUN: 98 mg/dL — ABNORMAL HIGH (ref 6–20)
CO2: 22 mmol/L (ref 22–32)
Calcium: 8.4 mg/dL — ABNORMAL LOW (ref 8.9–10.3)
Chloride: 103 mmol/L (ref 101–111)
Creatinine, Ser: 7.06 mg/dL — ABNORMAL HIGH (ref 0.61–1.24)
GFR calc Af Amer: 9 mL/min — ABNORMAL LOW (ref >60)
GFR calc non Af Amer: 8 mL/min — ABNORMAL LOW (ref >60)
Glucose, Bld: 135 mg/dL — ABNORMAL HIGH (ref 65–99)
Phosphorus: 5.5 mg/dL — ABNORMAL HIGH (ref 2.5–4.6)
Potassium: 5.8 mmol/L — ABNORMAL HIGH (ref 3.5–5.1)
Sodium: 136 mmol/L (ref 135–145)

## 2018-04-24 LAB — POCT HEMOGLOBIN-HEMACUE: Hemoglobin: 9.6 g/dL — ABNORMAL LOW (ref 13.0–17.0)

## 2018-04-24 MED ORDER — DARBEPOETIN ALFA 100 MCG/0.5ML IJ SOSY
PREFILLED_SYRINGE | INTRAMUSCULAR | Status: AC
  Administered 2018-04-24: 100 ug via SUBCUTANEOUS
  Filled 2018-04-24: qty 0.5

## 2018-04-24 MED ORDER — DARBEPOETIN ALFA 100 MCG/0.5ML IJ SOSY
100.0000 ug | PREFILLED_SYRINGE | INTRAMUSCULAR | Status: DC
  Administered 2018-04-24: 100 ug via SUBCUTANEOUS

## 2018-04-24 MED ORDER — FERUMOXYTOL INJECTION 510 MG/17 ML
510.0000 mg | Freq: Once | INTRAVENOUS | Status: AC
  Administered 2018-04-24: 510 mg via INTRAVENOUS
  Filled 2018-04-24: qty 17

## 2018-04-30 ENCOUNTER — Telehealth: Payer: Self-pay

## 2018-04-30 NOTE — Telephone Encounter (Signed)
Copied from [place] [id number]. Topic: Inquiry >> Apr 29, 2018  8:49 AM Pricilla Handler wrote: Reason for CRM: Patient states that the muscle relaxer Dr. Carlota Raspberry prescribed is not working. Patient's lower back pain has increased. Patient needs a referral to an Ortho Specialist. Please ask Dr. Carlota Raspberry to contact the patient at [phone].       Thank You!!!

## 2018-04-30 NOTE — Telephone Encounter (Signed)
Please advise 

## 2018-05-01 ENCOUNTER — Telehealth: Payer: Self-pay

## 2018-05-01 DIAGNOSIS — M5441 Lumbago with sciatica, right side: Secondary | ICD-10-CM

## 2018-05-01 NOTE — Telephone Encounter (Addendum)
No problem - I will place that referral, but if any worsening symptoms prior to ortho eval or if he wants to look at other med options, I am happy to see him. He can also try taking 2 of the flexeril (10mg  total) every 8 hours if needed.  Thanks.

## 2018-05-01 NOTE — Telephone Encounter (Signed)
Copied from [place] [id number]. Topic: Referral - Request >> May 01, 2018 11:38 AM Aurelio Brash B wrote: Reason for CRM:   Pt is requesting referral to ortho specialist  - he states the muscle relaxer are not helping his back

## 2018-05-01 NOTE — Addendum Note (Signed)
Addended by: [name] R on: 05/01/2018 05:13 PM   Modules accepted: Orders

## 2018-05-04 ENCOUNTER — Encounter (HOSPITAL_COMMUNITY): Payer: 59

## 2018-05-04 NOTE — Telephone Encounter (Signed)
Referral sent to [facility]. I left pt a vm with this information along with their phone number so he can call to schedule. Advised pt they will call him in a few days, but he can call them to schedule today after they have had time to review referral. Thanks!

## 2018-05-04 NOTE — Telephone Encounter (Signed)
Phone call to patient, advised of message from Dr. Carlota Raspberry below. He verbalizes understanding.   Patient states he is "sick of taking pills. They make me sleepy. I'm tired of hurting."  Would like ortho referral to be expedited. Patient advised of referral turnaround time, he states he wants to see someone in the next two days if possible.   Referrals, FYI.

## 2018-05-05 DIAGNOSIS — I120 Hypertensive chronic kidney disease with stage 5 chronic kidney disease or end stage renal disease: Secondary | ICD-10-CM | POA: Diagnosis not present

## 2018-05-05 DIAGNOSIS — N185 Chronic kidney disease, stage 5: Secondary | ICD-10-CM | POA: Diagnosis not present

## 2018-05-05 DIAGNOSIS — D631 Anemia in chronic kidney disease: Secondary | ICD-10-CM | POA: Diagnosis not present

## 2018-05-07 ENCOUNTER — Ambulatory Visit: Payer: 59 | Admitting: Family Medicine

## 2018-05-11 DIAGNOSIS — S335XXA Sprain of ligaments of lumbar spine, initial encounter: Secondary | ICD-10-CM | POA: Diagnosis not present

## 2018-05-15 DIAGNOSIS — E1122 Type 2 diabetes mellitus with diabetic chronic kidney disease: Secondary | ICD-10-CM | POA: Diagnosis not present

## 2018-05-15 DIAGNOSIS — R0602 Shortness of breath: Secondary | ICD-10-CM | POA: Diagnosis not present

## 2018-05-15 DIAGNOSIS — N185 Chronic kidney disease, stage 5: Secondary | ICD-10-CM | POA: Diagnosis not present

## 2018-05-19 ENCOUNTER — Emergency Department (HOSPITAL_COMMUNITY): Payer: 59

## 2018-05-19 ENCOUNTER — Inpatient Hospital Stay (HOSPITAL_COMMUNITY)
Admission: EM | Admit: 2018-05-19 | Discharge: 2018-05-25 | DRG: 264 | Disposition: A | Discharge status: Home / Self Care | Payer: 59 | Attending: Internal Medicine | Admitting: Internal Medicine

## 2018-05-19 ENCOUNTER — Encounter (HOSPITAL_COMMUNITY): Payer: Self-pay | Admitting: Emergency Medicine

## 2018-05-19 DIAGNOSIS — E1122 Type 2 diabetes mellitus with diabetic chronic kidney disease: Secondary | ICD-10-CM | POA: Diagnosis not present

## 2018-05-19 DIAGNOSIS — Z01810 Encounter for preprocedural cardiovascular examination: Secondary | ICD-10-CM | POA: Diagnosis not present

## 2018-05-19 DIAGNOSIS — Z7982 Long term (current) use of aspirin: Secondary | ICD-10-CM | POA: Diagnosis not present

## 2018-05-19 DIAGNOSIS — I10 Essential (primary) hypertension: Secondary | ICD-10-CM | POA: Diagnosis present

## 2018-05-19 DIAGNOSIS — R0902 Hypoxemia: Secondary | ICD-10-CM

## 2018-05-19 DIAGNOSIS — E1129 Type 2 diabetes mellitus with other diabetic kidney complication: Secondary | ICD-10-CM | POA: Diagnosis present

## 2018-05-19 DIAGNOSIS — Z992 Dependence on renal dialysis: Secondary | ICD-10-CM | POA: Diagnosis not present

## 2018-05-19 DIAGNOSIS — D631 Anemia in chronic kidney disease: Secondary | ICD-10-CM | POA: Diagnosis present

## 2018-05-19 DIAGNOSIS — E8889 Other specified metabolic disorders: Secondary | ICD-10-CM | POA: Diagnosis present

## 2018-05-19 DIAGNOSIS — B027 Disseminated zoster: Secondary | ICD-10-CM | POA: Diagnosis not present

## 2018-05-19 DIAGNOSIS — Z8701 Personal history of pneumonia (recurrent): Secondary | ICD-10-CM

## 2018-05-19 DIAGNOSIS — J189 Pneumonia, unspecified organism: Secondary | ICD-10-CM | POA: Diagnosis not present

## 2018-05-19 DIAGNOSIS — X16XXXA Contact with hot heating appliances, radiators and pipes, initial encounter: Secondary | ICD-10-CM | POA: Diagnosis present

## 2018-05-19 DIAGNOSIS — N186 End stage renal disease: Secondary | ICD-10-CM | POA: Diagnosis not present

## 2018-05-19 DIAGNOSIS — I313 Pericardial effusion (noninflammatory): Secondary | ICD-10-CM | POA: Diagnosis present

## 2018-05-19 DIAGNOSIS — R0602 Shortness of breath: Secondary | ICD-10-CM | POA: Diagnosis not present

## 2018-05-19 DIAGNOSIS — T24032A Burn of unspecified degree of left lower leg, initial encounter: Secondary | ICD-10-CM | POA: Diagnosis present

## 2018-05-19 DIAGNOSIS — Z833 Family history of diabetes mellitus: Secondary | ICD-10-CM | POA: Diagnosis not present

## 2018-05-19 DIAGNOSIS — N185 Chronic kidney disease, stage 5: Secondary | ICD-10-CM | POA: Diagnosis not present

## 2018-05-19 DIAGNOSIS — E1142 Type 2 diabetes mellitus with diabetic polyneuropathy: Secondary | ICD-10-CM | POA: Diagnosis present

## 2018-05-19 DIAGNOSIS — J9601 Acute respiratory failure with hypoxia: Secondary | ICD-10-CM | POA: Diagnosis present

## 2018-05-19 DIAGNOSIS — B019 Varicella without complication: Secondary | ICD-10-CM | POA: Diagnosis not present

## 2018-05-19 DIAGNOSIS — I509 Heart failure, unspecified: Secondary | ICD-10-CM | POA: Diagnosis not present

## 2018-05-19 DIAGNOSIS — R112 Nausea with vomiting, unspecified: Secondary | ICD-10-CM | POA: Diagnosis present

## 2018-05-19 DIAGNOSIS — E119 Type 2 diabetes mellitus without complications: Secondary | ICD-10-CM | POA: Diagnosis not present

## 2018-05-19 DIAGNOSIS — D649 Anemia, unspecified: Secondary | ICD-10-CM | POA: Diagnosis present

## 2018-05-19 DIAGNOSIS — I5032 Chronic diastolic (congestive) heart failure: Secondary | ICD-10-CM | POA: Diagnosis present

## 2018-05-19 DIAGNOSIS — Z794 Long term (current) use of insulin: Secondary | ICD-10-CM | POA: Diagnosis not present

## 2018-05-19 DIAGNOSIS — I5033 Acute on chronic diastolic (congestive) heart failure: Secondary | ICD-10-CM | POA: Diagnosis present

## 2018-05-19 DIAGNOSIS — I132 Hypertensive heart and chronic kidney disease with heart failure and with stage 5 chronic kidney disease, or end stage renal disease: Secondary | ICD-10-CM | POA: Diagnosis not present

## 2018-05-19 DIAGNOSIS — I120 Hypertensive chronic kidney disease with stage 5 chronic kidney disease or end stage renal disease: Secondary | ICD-10-CM | POA: Diagnosis not present

## 2018-05-19 DIAGNOSIS — Z880 Allergy status to penicillin: Secondary | ICD-10-CM | POA: Diagnosis not present

## 2018-05-19 DIAGNOSIS — Z79899 Other long term (current) drug therapy: Secondary | ICD-10-CM

## 2018-05-19 DIAGNOSIS — R1111 Vomiting without nausea: Secondary | ICD-10-CM | POA: Diagnosis not present

## 2018-05-19 DIAGNOSIS — D638 Anemia in other chronic diseases classified elsewhere: Secondary | ICD-10-CM | POA: Diagnosis present

## 2018-05-19 DIAGNOSIS — Z4901 Encounter for fitting and adjustment of extracorporeal dialysis catheter: Secondary | ICD-10-CM | POA: Diagnosis not present

## 2018-05-19 LAB — BASIC METABOLIC PANEL
Anion gap: 16 — ABNORMAL HIGH (ref 5–15)
BUN: 132 mg/dL — ABNORMAL HIGH (ref 6–20)
CO2: 23 mmol/L (ref 22–32)
Calcium: 6.2 mg/dL — CL (ref 8.9–10.3)
Chloride: 100 mmol/L — ABNORMAL LOW (ref 101–111)
Creatinine, Ser: 8.53 mg/dL — ABNORMAL HIGH (ref 0.61–1.24)
GFR calc Af Amer: 7 mL/min — ABNORMAL LOW (ref >60)
GFR calc non Af Amer: 6 mL/min — ABNORMAL LOW (ref >60)
Glucose, Bld: 216 mg/dL — ABNORMAL HIGH (ref 65–99)
Potassium: 4 mmol/L (ref 3.5–5.1)
Sodium: 139 mmol/L (ref 135–145)

## 2018-05-19 LAB — CBC
HCT: 29.3 % — ABNORMAL LOW (ref 39.0–52.0)
Hemoglobin: 9.5 g/dL — ABNORMAL LOW (ref 13.0–17.0)
MCH: 29.6 pg (ref 26.0–34.0)
MCHC: 32.4 g/dL (ref 30.0–36.0)
MCV: 91.3 fL (ref 78.0–100.0)
Platelets: 207 10*3/uL (ref 150–400)
RBC: 3.21 MIL/uL — ABNORMAL LOW (ref 4.22–5.81)
RDW: 12.5 % (ref 11.5–15.5)
WBC: 8.3 10*3/uL (ref 4.0–10.5)

## 2018-05-19 LAB — I-STAT TROPONIN, ED: Troponin i, poc: 0.02 ng/mL (ref 0.00–0.08)

## 2018-05-19 LAB — I-STAT CG4 LACTIC ACID, ED
Lactic Acid, Venous: 0.54 mmol/L (ref 0.5–1.9)
Lactic Acid, Venous: 0.39 mmol/L — ABNORMAL LOW (ref 0.5–1.9)

## 2018-05-19 LAB — MAGNESIUM: Magnesium: 2.5 mg/dL — ABNORMAL HIGH (ref 1.7–2.4)

## 2018-05-19 LAB — BRAIN NATRIURETIC PEPTIDE: B Natriuretic Peptide: 1003.6 pg/mL — ABNORMAL HIGH (ref 0.0–100.0)

## 2018-05-19 LAB — PHOSPHORUS: Phosphorus: 8 mg/dL — ABNORMAL HIGH (ref 2.5–4.6)

## 2018-05-19 MED ORDER — ZOLPIDEM TARTRATE 5 MG PO TABS: 5.0000 mg | ORAL_TABLET | Freq: Every evening | ORAL | Status: DC | PRN

## 2018-05-19 MED ORDER — VANCOMYCIN HCL IN DEXTROSE 750-5 MG/150ML-% IV SOLN
750.0000 mg | Freq: Once | INTRAVENOUS | Status: AC
  Administered 2018-05-20: 750 mg via INTRAVENOUS
  Filled 2018-05-19: qty 150

## 2018-05-19 MED ORDER — AMLODIPINE BESYLATE 5 MG PO TABS: 10.0000 mg | ORAL_TABLET | Freq: Every day | ORAL | Status: DC

## 2018-05-19 MED ORDER — RENA-VITE PO TABS
1.0000 | ORAL_TABLET | Freq: Every day | ORAL | Status: DC
  Administered 2018-05-20 – 2018-05-24 (×5): 1 via ORAL
  Filled 2018-05-19 (×7): qty 1

## 2018-05-19 MED ORDER — NITROGLYCERIN 0.4 MG SL SUBL: 0.4000 mg | SUBLINGUAL_TABLET | SUBLINGUAL | Status: DC | PRN

## 2018-05-19 MED ORDER — ALBUTEROL SULFATE (2.5 MG/3ML) 0.083% IN NEBU: 2.5000 mg | INHALATION_SOLUTION | RESPIRATORY_TRACT | Status: DC | PRN

## 2018-05-19 MED ORDER — HEPARIN SODIUM (PORCINE) 5000 UNIT/ML IJ SOLN
5000.0000 [IU] | Freq: Three times a day (TID) | INTRAMUSCULAR | Status: DC
  Administered 2018-05-20 – 2018-05-25 (×14): 5000 [IU] via SUBCUTANEOUS
  Filled 2018-05-19 (×15): qty 1

## 2018-05-19 MED ORDER — SODIUM CHLORIDE 0.9 % IV SOLN
2.0000 g | Freq: Once | INTRAVENOUS | Status: AC
  Administered 2018-05-19: 2 g via INTRAVENOUS
  Filled 2018-05-19: qty 2

## 2018-05-19 MED ORDER — CARVEDILOL 12.5 MG PO TABS
37.5000 mg | ORAL_TABLET | Freq: Two times a day (BID) | ORAL | Status: DC
  Filled 2018-05-19: qty 3

## 2018-05-19 MED ORDER — MAGNESIUM SULFATE 2 GM/50ML IV SOLN
2.0000 g | Freq: Once | INTRAVENOUS | Status: AC
  Administered 2018-05-19: 2 g via INTRAVENOUS
  Filled 2018-05-19: qty 50

## 2018-05-19 MED ORDER — FUROSEMIDE 10 MG/ML IJ SOLN
120.0000 mg | Freq: Two times a day (BID) | INTRAVENOUS | Status: DC
  Administered 2018-05-20: 120 mg via INTRAVENOUS
  Filled 2018-05-19 (×2): qty 12

## 2018-05-19 MED ORDER — SODIUM CHLORIDE 0.9 % IV SOLN
2.0000 g | Freq: Once | INTRAVENOUS | Status: AC
  Administered 2018-05-20: 2 g via INTRAVENOUS
  Filled 2018-05-19: qty 20

## 2018-05-19 MED ORDER — ACETAMINOPHEN 325 MG PO TABS
650.0000 mg | ORAL_TABLET | Freq: Four times a day (QID) | ORAL | Status: DC | PRN
  Administered 2018-05-22: 650 mg via ORAL
  Filled 2018-05-19: qty 2

## 2018-05-19 MED ORDER — SODIUM CHLORIDE 0.9 % IV SOLN
1.0000 g | INTRAVENOUS | Status: DC
  Filled 2018-05-19: qty 1

## 2018-05-19 MED ORDER — INSULIN ASPART 100 UNIT/ML ~~LOC~~ SOLN: 0.0000 [IU] | Freq: Three times a day (TID) | SUBCUTANEOUS | Status: DC

## 2018-05-19 MED ORDER — HYDRALAZINE HCL 20 MG/ML IJ SOLN: 5.0000 mg | INTRAMUSCULAR | Status: DC | PRN

## 2018-05-19 MED ORDER — DM-GUAIFENESIN ER 30-600 MG PO TB12: 1.0000 | ORAL_TABLET | Freq: Two times a day (BID) | ORAL | Status: DC | PRN

## 2018-05-19 MED ORDER — HYDROXYZINE HCL 10 MG PO TABS
10.0000 mg | ORAL_TABLET | Freq: Three times a day (TID) | ORAL | Status: DC | PRN
  Filled 2018-05-19: qty 1

## 2018-05-19 MED ORDER — VANCOMYCIN HCL IN DEXTROSE 1-5 GM/200ML-% IV SOLN
1000.0000 mg | Freq: Once | INTRAVENOUS | Status: AC
  Administered 2018-05-19: 1000 mg via INTRAVENOUS
  Filled 2018-05-19: qty 200

## 2018-05-19 MED ORDER — ASPIRIN 81 MG PO TABS: 81.0000 mg | ORAL_TABLET | ORAL | Status: DC

## 2018-05-19 MED ORDER — RENAL VITAMIN 0.8 MG PO TABS: ORAL_TABLET | Freq: Every day | ORAL | Status: DC

## 2018-05-19 MED ORDER — ASPIRIN 81 MG PO CHEW
81.0000 mg | CHEWABLE_TABLET | Freq: Every day | ORAL | Status: DC
  Administered 2018-05-20 – 2018-05-25 (×5): 81 mg via ORAL
  Filled 2018-05-19 (×5): qty 1

## 2018-05-19 MED ORDER — INSULIN ASPART 100 UNIT/ML ~~LOC~~ SOLN: 0.0000 [IU] | Freq: Every day | SUBCUTANEOUS | Status: DC

## 2018-05-19 NOTE — H&P (Addendum)
History and Physical    [name] [name] Bound XVQ:[id number] DOB: 07-20-1966 DOA: 05/19/2018  Referring MD/NP/PA:   PCP: [name] Sacramento, MD   Patient coming from:  The patient is coming from home.  At baseline, pt is independent for most of ADL.   Chief Complaint: SOB  HPI: [name] [name] is a 52 y.o. male with medical history significant of ESRD (not started HD yet), hypertension, diabetes mellitus, dCHF, anemia, who presents with shortness of breath.  Patient states that he has been having shortness of breath for more than 3 days, which has been progressively getting worse.  He has dry cough, but no fever or chills.  He also has chest tightness on exertion, currently no chest pain.  Patient denies nausea, vomiting, diarrhea, abdominal pain.  No symptoms of UTI or unilateral weakness.  Patient states that he is a scheduled for AVF placement on 06/09/18 at [facility].   ED Course: pt was found to have BNP 1003.6, negative troponin, lactic acid is 0.39, WBC 8.3, creatinine 8.53, BUN 132, potassium 4.0, bicarbonate 23, temperature 99.1, no tachycardia, has tachypnea, his oxygen desaturation to 87% on room air. Pt is admitted to telemetry bed as inpatient.  CXR showed 1. Interval worsening of airspace disease particularly at the lung bases right greater than left with moderate size right pleural effusion. Favor pneumonia but superimposed edema may be present as well. 2. Stable mild cardiomegaly.   CT-chest without contrast showed: 1. Prominent bilateral pneumonia involving all lobes of both lungs with associated very prominent bronchial thickening. Associated small bilateral pleural effusions as well as calcific material in both lower lobes consistent with chronic aspiration of either barium or other calcific material. This calcific material was not present in the lung parenchyma in 2013. 2. Small mediastinal lymph nodes which are borderline in size and could be reactive based on the prominent  pneumonia present. The largest measures 1.4 cm in short axis in the lower right paratracheal station. 3. Small pericardial effusion.  Review of Systems:   General: no fevers, chills, no body weight gain, has fatigue HEENT: no blurry vision, hearing changes or sore throat Respiratory: has dyspnea, coughing, no wheezing CV: has chest pain tightness. no palpitations GI: no nausea, vomiting, abdominal pain, diarrhea, constipation GU: no dysuria, burning on urination, increased urinary frequency, hematuria  Ext: has mild leg edema Neuro: no unilateral weakness, numbness, or tingling, no vision change or hearing loss Skin: no rash, no skin tear. MSK: No muscle spasm, no deformity, no limitation of range of movement in spin Heme: No easy bruising.  Travel history: No recent long distant travel.  Allergy:  Allergies  Allergen Reactions  . Penicillins Rash    Tolerating Ceftriaxone (03/12/12) Has patient had a PCN reaction causing immediate rash, facial/tongue/throat swelling, SOB or lightheadedness with hypotension: Yes Has patient had a PCN reaction causing severe rash involving mucus membranes or skin necrosis: Unk Has patient had a PCN reaction that required hospitalization: No Has patient had a PCN reaction occurring within the last 10 years: No If all of the above answers are "NO", then may proceed with Cephalosporin use.     Past Medical History:  Diagnosis Date  . Bacteremia   . Diabetes mellitus   . Hypertension   . Pneumonia 02/2012   Strep pneumoniae bilateral pneumonia complicated by bacteremia  . Renal disorder    End stage, not on dialysis    Past Surgical History:  Procedure Laterality Date  . CHEST TUBE INSERTION  placed during hospitalization 02/2012  . COLONOSCOPY  04/01/2012   Procedure: COLONOSCOPY;  Surgeon: Juanita Craver, MD;  Location: [facility] ENDOSCOPY;  Service: Endoscopy;  Laterality: N/A;  . ESOPHAGOGASTRODUODENOSCOPY  03/31/2012   Procedure:  ESOPHAGOGASTRODUODENOSCOPY (EGD);  Surgeon: Beryle Beams, MD;  Location: [facility] ENDOSCOPY;  Service: Endoscopy;  Laterality: N/A;    Social History:  reports that he has never smoked. He has never used smokeless tobacco. He reports that he drinks alcohol. He reports that he does not use drugs.  Family History:  Family History  Problem Relation Age of Onset  . Diabetes Maternal Grandmother      Prior to Admission medications   Medication Sig Start Date End Date Taking? Authorizing Provider  amLODipine (NORVASC) 10 MG tablet Take 10 mg by mouth daily.  03/21/17  Yes [provider]  aspirin 81 MG tablet Take 81 mg by mouth every morning.    Yes [provider]  B Complex-C-Folic Acid (RENAL VITAMIN PO) Take 1 tablet by mouth daily.    Yes [provider]  carvedilol (COREG) 25 MG tablet Take 1.5 tablets (37.5 mg total) by mouth 2 (two) times daily with a meal. 03/25/17  Yes Troy Sine, MD  Darbepoetin Alfa (ARANESP) 100 MCG/0.5ML SOSY injection Inject 100 mcg into the skin every 28 (twenty-eight) days.   Yes [provider]  furosemide (LASIX) 80 MG tablet Take 160 mg by mouth 2 (two) times daily.  03/15/17  Yes [provider]  insulin aspart (NOVOLOG FLEXPEN) 100 UNIT/ML FlexPen 5-15 units before meals Patient taking differently: Inject 0-11 Units into the skin See admin instructions. 0-11 units three times a day as needed before meals, per sliding scale 04/08/17  Yes Elayne Snare, MD  metolazone (ZAROXOLYN) 2.5 MG tablet Take 1 tablet (2.5 mg total) by mouth every other day. Patient taking differently: Take 2.5 mg by mouth See admin instructions. Mondays, Wednesdays, and Fridays 03/03/18  Yes Rama, Venetia Maxon, MD  TRESIBA FLEXTOUCH 100 UNIT/ML SOPN FlexTouch Pen INJECT 0.38 MLS (38 UNITS TOTAL) INTO THE SKIN DAILY AT 10 PM. Patient taking differently: Inject 38 Units into the skin at bedtime.  05/16/17  Yes Elayne Snare, MD  Vitamin D, Ergocalciferol,  (DRISDOL) 50000 units CAPS capsule Take 50,000 Units by mouth every Sunday.    Yes [provider]  Continuous Blood Gluc Receiver (FREESTYLE LIBRE READER) DEVI 1 Device by Does not apply route as directed. 03/31/17   Elayne Snare, MD  Continuous Blood Gluc Sensor (FREESTYLE LIBRE SENSOR SYSTEM) MISC Apply sensor to upper arm and change sensor every 10 days. 03/26/18   Elayne Snare, MD  cyclobenzaprine (FLEXERIL) 5 MG tablet 1 pill by mouth up to every 8 hours as needed. Start with one pill by mouth each bedtime as needed due to sedation Patient not taking: Reported on 05/19/2018 04/20/18   Wendie Agreste, MD  Insulin Pen Needle 31G X 5 MM MISC Use to inject Tyler Aas once daily 03/28/17   Elayne Snare, MD    Physical Exam: Vitals:   05/19/18 1815 05/19/18 1945 05/19/18 2045 05/19/18 2115  BP: (!) 165/88 (!) 167/94 (!) 158/89 (!) 156/84  Pulse: 84 85 81 83  Resp: (!) 23 (!) 26 (!) 26 (!) 29  Temp:      TempSrc:      SpO2: 93% 93% 95% 92%  Weight:      Height:       General: Not in acute distress HEENT:  Eyes: PERRL, EOMI, no scleral icterus.       ENT: No discharge from the ears and nose, no pharynx injection, no tonsillar enlargement.        Neck: No JVD, no bruit, no mass felt. Heme: No neck lymph node enlargement. Cardiac: S1/S2, RRR, No murmurs, No gallops or rubs. Respiratory: has crackles bilaterally. GI: Soft, nondistended, nontender, no rebound pain, no organomegaly, BS present. GU: No hematuria Ext: has trace leg edema bilaterally. 2+DP/PT pulse bilaterally. Musculoskeletal: No joint deformities, No joint redness or warmth, no limitation of ROM in spin. Skin: No rashes.  Neuro: Alert, oriented X3, cranial nerves II-XII grossly intact, moves all extremities normally.  Psych: Patient is not psychotic, no suicidal or hemocidal ideation.  Labs on Admission: I have personally reviewed following labs and imaging studies  CBC: Recent Labs  Lab 05/19/18 1320  WBC 8.3    HGB 9.5*  HCT 29.3*  MCV 91.3  PLT 696   Basic Metabolic Panel: Recent Labs  Lab 05/19/18 1320 05/19/18 1728  NA 139  --   K 4.0  --   CL 100*  --   CO2 23  --   GLUCOSE 216*  --   BUN 132*  --   CREATININE 8.53*  --   CALCIUM 6.2*  --   MG  --  2.5*  PHOS  --  8.0*   GFR: Estimated Creatinine Clearance: 11.9 mL/min (A) (by C-G formula based on SCr of 8.53 mg/dL (H)). Liver Function Tests: No results for input(s): AST, ALT, ALKPHOS, BILITOT, PROT, ALBUMIN in the last 168 hours. No results for input(s): LIPASE, AMYLASE in the last 168 hours. No results for input(s): AMMONIA in the last 168 hours. Coagulation Profile: No results for input(s): INR, PROTIME in the last 168 hours. Cardiac Enzymes: No results for input(s): CKTOTAL, CKMB, CKMBINDEX, TROPONINI in the last 168 hours. BNP (last 3 results) No results for input(s): PROBNP in the last 8760 hours. HbA1C: No results for input(s): HGBA1C in the last 72 hours. CBG: No results for input(s): GLUCAP in the last 168 hours. Lipid Profile: No results for input(s): CHOL, HDL, LDLCALC, TRIG, CHOLHDL, LDLDIRECT in the last 72 hours. Thyroid Function Tests: No results for input(s): TSH, T4TOTAL, FREET4, T3FREE, THYROIDAB in the last 72 hours. Anemia Panel: No results for input(s): VITAMINB12, FOLATE, FERRITIN, TIBC, IRON, RETICCTPCT in the last 72 hours. Urine analysis:    Component Value Date/Time   COLORURINE STRAW (A) 01/29/2017 2304   APPEARANCEUR CLEAR 01/29/2017 2304   LABSPEC 1.010 01/29/2017 2304   PHURINE 5.0 01/29/2017 2304   GLUCOSEU 150 (A) 01/29/2017 2304   HGBUR NEGATIVE 01/29/2017 2304   BILIRUBINUR negative 04/20/2018 1452   KETONESUR negative 04/20/2018 1452   KETONESUR NEGATIVE 01/29/2017 2304   PROTEINUR >=300 (A) 04/20/2018 1452   PROTEINUR 100 (A) 01/29/2017 2304   UROBILINOGEN 0.2 04/20/2018 1452   UROBILINOGEN 0.2 04/09/2012 2229   NITRITE Negative 04/20/2018 1452   NITRITE NEGATIVE  01/29/2017 2304   LEUKOCYTESUR Negative 04/20/2018 1452   Sepsis Labs: @LABRCNTIP (procalcitonin:4,lacticidven:4) )No results found for this or any previous visit (from the past 240 hour(s)).   Radiological Exams on Admission: Dg Chest 2 View  Result Date: 05/19/2018 CLINICAL DATA:  Chest tightness, shortness of breath, stage 4 kidney disease EXAM: CHEST - 2 VIEW COMPARISON:  Chest x-ray of 03/02/2018 FINDINGS: There has been worsening basilar opacities and pneumonia cannot be excluded. Also there is now a right pleural effusion some which tracks into the  minor fissure. Superimposed edema cannot be excluded. The heart is mildly enlarged and stable. No bony abnormality is seen. IMPRESSION: 1. Interval worsening of airspace disease particularly at the lung bases right greater than left with moderate size right pleural effusion. Favor pneumonia but superimposed edema may be present as well. 2. Stable mild cardiomegaly. Electronically Signed   By: Ivar Drape M.D.   On: 05/19/2018 13:40   Ct Chest Wo Contrast  Result Date: 05/19/2018 CLINICAL DATA:  Chest tightness and shortness of breath. History of pneumonia. EXAM: CT CHEST WITHOUT CONTRAST TECHNIQUE: Multidetector CT imaging of the chest was performed following the standard protocol without IV contrast. COMPARISON:  Chest x-ray earlier today. CT of the chest on 06/05/2012 FINDINGS: Cardiovascular: The heart size is within normal limits. There is a small amount of pericardial fluid. The thoracic aorta and central pulmonary arteries are normal in caliber. Mediastinum/Nodes: There are some small mediastinal lymph nodes present. The largest lower right paratracheal lymph node measures 1.4 cm in short axis. Thyroid gland, trachea, and esophagus demonstrate no significant findings. Lungs/Pleura: Prominent airspace disease and very prominent bronchial thickening and edema localizes mostly to the lower lobes bilaterally but also the lingula, left upper lobe,  right upper lobe and right middle lobe. Associated calcific material is also present in the lung parenchyma of the lower lobes bilaterally which was not present on the prior CT in 2013. This suggests chronic aspiration with possible interval aspiration of barium and or other calcific material since 2013. There are small bilateral pleural effusions with some right pleural fluid extending into the major and minor fissures. Upper Abdomen: No acute abnormality. Musculoskeletal: No chest wall mass or suspicious bone lesions identified. IMPRESSION: 1. Prominent bilateral pneumonia involving all lobes of both lungs with associated very prominent bronchial thickening. Associated small bilateral pleural effusions as well as calcific material in both lower lobes consistent with chronic aspiration of either barium or other calcific material. This calcific material was not present in the lung parenchyma in 2013. 2. Small mediastinal lymph nodes which are borderline in size and could be reactive based on the prominent pneumonia present. The largest measures 1.4 cm in short axis in the lower right paratracheal station. 3. Small pericardial effusion. Electronically Signed   By: Aletta Edouard M.D.   On: 05/19/2018 20:16     EKG: Independently reviewed.  Sinus rhythm, QTC 561, old left bundle blockade, poor R wave progression, LAD, LAE. Marland Kitchen   Assessment/Plan Principal Problem:   Acute respiratory failure with hypoxia (HCC) Active Problems:   Essential hypertension, benign   CKD (chronic kidney disease), stage V (HCC)   Acute on chronic diastolic CHF (congestive heart failure) (HCC)   Type II diabetes mellitus with renal manifestations (HCC)   Anemia   Hypocalcemia   HCAP (healthcare-associated pneumonia)   Acute respiratory failure with hypoxia ([place]): Most likely due to fluid overload secondary to end-stage renal disease and CHF exacerbation, but I cannot completely rule out HCAP pneumonia given CT of the chest  the findings of bilateral infiltration, and small mediastinal lymph nodes which are borderline in size and could be reactive.   - will admit to [facility] as inpt - prn BiPAP - EDP started vanco and cefepime, will continue, but will d/c if pt continues to show no leukocytosis or fever. - Mucinex for cough  - prn Albuterol Nebs for SOB - Urine legionella and S. pneumococcal antigen - Follow up blood culture x2, sputum culture and respiratory virus panel  CKD (  chronic kidney disease), stage V: now reachs ESRD. Creatinine 8.53, BUN 132, potassium 4.0, bicarbonate 23. Pt also has fluid overload. Pt states that he is scheduled for AV fistula placement on 06/09/18 at [facility]. Pt likely needs dialysis in this admission. -renal, Dr. Jimmy Footman was consult, will see pt in AM  HTN:  -Amlodipine, Coreg -IV hydralazine prn  Acute on chronic diastolic CHF (congestive heart failure) ([place]): 2D echo on 01/28/2017 showed EF 55 to 60% with grade 1 diastolic dysfunction.  Patient has trace leg edema, but has elevated BNP, SOB, clinically consistent with fluid overload.  I consulted Dr. Jimmy Footman of renal, who recommended to put the patient on high dose of Lasix, 160 mg every 6 hours. -Lasix 160 mg q6h by IV -trop x 3 -2d echo -will continue home coreg, ASA  Type II diabetes mellitus with renal manifestations ([place]): Last A1c 6.3 on 03/29/27, well controled. Patient is taking NovoLog, and [place]  at home -will decrease Tresiba dose from 38-->26 U daily -SSI  Anemia: Hgb 9.5. Due to ESRD -anemia penal  Hypocalcemia and hyperphosphatemia Ca 6.2.  Phosphorus 8.0 -Calcium gluconate 2 g -check VD1, 25 and PTH -start Lanthanum per renal  Nausea, vomiting: Patient did not have nausea and vomiting initially, but developed nausea vomiting in ED.  Patient states that he has some abdominal pain.  Etiology is not clear. - Check lipase -CT abdomen/pelvis when patient is stabilized.   DVT ppx: SQ Heparin    Code  Status: Full code Family Communication: None at bed side.   Disposition Plan:  Anticipate discharge back to previous home environment Consults called:  none Admission status: Inpatient/tele      Date of Service 05/19/2018    Ivor Costa [facility] Pager [phone]  If 7PM-7AM, please contact night-coverage [email or web address] Password [facility] 05/19/2018, 11:29 PM

## 2018-05-19 NOTE — Progress Notes (Signed)
Pharmacy Antibiotic Note  [name] [name] is a 52 y.o. male admitted on 05/19/2018 with pneumonia.  Pharmacy has been consulted for vancomycin dosing. Vancomycin 1gm given earlier  Plan: Will give addition 750 mg dose tonight. Check random level in ~ 4 days for redose  Height: 6\' 2"  (188 cm) Weight: 198 lb (89.8 kg) IBW/kg (Calculated) : 82.2  Temp (24hrs), Avg:98.7 F (37.1 C), Min:98.3 F (36.8 C), Max:99.1 F (37.3 C)  Recent Labs  Lab 05/19/18 1320 05/19/18 1743 05/19/18 1926  WBC 8.3  --   --   CREATININE 8.53*  --   --   LATICACIDVEN  --  0.54 0.39*    Estimated Creatinine Clearance: 11.9 mL/min (A) (by C-G formula based on SCr of 8.53 mg/dL (H)).    Allergies  Allergen Reactions  . Penicillins Rash    Tolerating Ceftriaxone (03/12/12) Has patient had a PCN reaction causing immediate rash, facial/tongue/throat swelling, SOB or lightheadedness with hypotension: Yes Has patient had a PCN reaction causing severe rash involving mucus membranes or skin necrosis: Unk Has patient had a PCN reaction that required hospitalization: No Has patient had a PCN reaction occurring within the last 10 years: No If all of the above answers are "NO", then may proceed with Cephalosporin use.      Thank you for allowing pharmacy to be a part of this patient's care.  Excell Seltzer Poteet 05/19/2018 11:05 PM

## 2018-05-19 NOTE — ED Provider Notes (Addendum)
[facility] EMERGENCY DEPARTMENT Provider Note   CSN: [id number] Arrival date & time: 05/19/18  1249     History   Chief Complaint Chief Complaint  Patient presents with  . Shortness of Breath  . Chest Pain    HPI [name] [name] is a 52 y.o. male.  HPI  Patient is a 52 year old male with past medical history of ESRD, not currently on dialysis but scheduled for PD catheter placement on the 16th of next month, insulin-dependent diabetes, hypertension, COPD, CHF who comes in today complaining of cough, congestion, chest pain and shortness of breath.  Patient denies any weight gain or significant increase in lower extremity edema.  Patient denies any fevers chills nausea or vomiting.  Patient denies diaphoresis, lightheadedness.  Patient complains of extreme dyspnea on exertion with associated chest pain.  Patient endorses nonproductive cough.  On arrival, patient with O2 sats in the upper 80s with return to normal on 2 L via nasal cannula, patient does not have an oxygen requirement at home.  Past Medical History:  Diagnosis Date  . Bacteremia   . Diabetes mellitus   . Hypertension   . Pneumonia 02/2012   Strep pneumoniae bilateral pneumonia complicated by bacteremia  . Renal disorder    End stage, not on dialysis    Patient Active Problem List   Diagnosis Date Noted  . Acute respiratory failure with hypoxia ([place]) 05/19/2018  . Type II diabetes mellitus with renal manifestations ([place]) 05/19/2018  . Anemia 05/19/2018  . Hypocalcemia 05/19/2018  . HCAP (healthcare-associated pneumonia) 05/19/2018  . ESRD (end stage renal disease) ([place])   . CKD (chronic kidney disease), stage V ([place]) 03/02/2018  . Acute on chronic diastolic CHF (congestive heart failure) ([place]) 03/02/2018  . CAP (community acquired pneumonia) 03/02/2018  . AKI (acute kidney injury) ([place]) 01/29/2017  . Palpitations 01/13/2017  . Essential hypertension, benign 10/26/2013  . Dyspnea  06/09/2012  . Dizziness 06/09/2012  . Pneumonia, organism unspecified([place]) 04/07/2012  . Pleural effusion 04/07/2012  . Parapneumonic effusion 03/28/2012  . Symptomatic anemia 03/28/2012  . Hypoalbuminemia 03/28/2012  . Total bilirubin, elevated 03/28/2012  . Bilateral leg edema 03/23/2012  . Cyst of skin 03/23/2012  . Bacteremia due to Streptococcus pneumoniae 03/15/2012  . Hyponatremia 03/15/2012  . Acute kidney failure 03/13/2012  . Elevated LFTs 03/13/2012  . Pneumococcal pneumonia ([place]) 03/12/2012  . Insulin-requiring or dependent type II diabetes mellitus ([place]) 03/12/2012    Past Surgical History:  Procedure Laterality Date  . CHEST TUBE INSERTION     placed during hospitalization 02/2012  . COLONOSCOPY  04/01/2012   Procedure: COLONOSCOPY;  Surgeon: Juanita Craver, MD;  Location: [facility] ENDOSCOPY;  Service: Endoscopy;  Laterality: N/A;  . ESOPHAGOGASTRODUODENOSCOPY  03/31/2012   Procedure: ESOPHAGOGASTRODUODENOSCOPY (EGD);  Surgeon: Beryle Beams, MD;  Location: [facility] ENDOSCOPY;  Service: Endoscopy;  Laterality: N/A;        Home Medications    Prior to Admission medications   Medication Sig Start Date End Date Taking? Authorizing Provider  amLODipine (NORVASC) 10 MG tablet Take 10 mg by mouth daily.  03/21/17  Yes [provider]  aspirin 81 MG tablet Take 81 mg by mouth every morning.    Yes [provider]  B Complex-C-Folic Acid (RENAL VITAMIN PO) Take 1 tablet by mouth daily.    Yes [provider]  carvedilol (COREG) 25 MG tablet Take 1.5 tablets (37.5 mg total) by mouth 2 (two) times daily with a meal. 03/25/17  Yes Claiborne Billings,  Joyice Faster, MD  Darbepoetin Alfa (ARANESP) 100 MCG/0.5ML SOSY injection Inject 100 mcg into the skin every 28 (twenty-eight) days.   Yes [provider]  furosemide (LASIX) 80 MG tablet Take 160 mg by mouth 2 (two) times daily.  03/15/17  Yes [provider]  insulin aspart (NOVOLOG FLEXPEN) 100 UNIT/ML FlexPen 5-15  units before meals Patient taking differently: Inject 0-11 Units into the skin See admin instructions. 0-11 units three times a day as needed before meals, per sliding scale 04/08/17  Yes Elayne Snare, MD  metolazone (ZAROXOLYN) 2.5 MG tablet Take 1 tablet (2.5 mg total) by mouth every other day. Patient taking differently: Take 2.5 mg by mouth See admin instructions. Mondays, Wednesdays, and Fridays 03/03/18  Yes Rama, Venetia Maxon, MD  TRESIBA FLEXTOUCH 100 UNIT/ML SOPN FlexTouch Pen INJECT 0.38 MLS (38 UNITS TOTAL) INTO THE SKIN DAILY AT 10 PM. Patient taking differently: Inject 38 Units into the skin at bedtime.  05/16/17  Yes Elayne Snare, MD  Vitamin D, Ergocalciferol, (DRISDOL) 50000 units CAPS capsule Take 50,000 Units by mouth every Sunday.    Yes [provider]  Continuous Blood Gluc Receiver (FREESTYLE LIBRE READER) DEVI 1 Device by Does not apply route as directed. 03/31/17   Elayne Snare, MD  Continuous Blood Gluc Sensor (FREESTYLE LIBRE SENSOR SYSTEM) MISC Apply sensor to upper arm and change sensor every 10 days. 03/26/18   Elayne Snare, MD  cyclobenzaprine (FLEXERIL) 5 MG tablet 1 pill by mouth up to every 8 hours as needed. Start with one pill by mouth each bedtime as needed due to sedation Patient not taking: Reported on 05/19/2018 04/20/18   Wendie Agreste, MD  Insulin Pen Needle 31G X 5 MM MISC Use to inject Tyler Aas once daily 03/28/17   Elayne Snare, MD    Family History Family History  Problem Relation Age of Onset  . Diabetes Maternal Grandmother     Social History Social History   Tobacco Use  . Smoking status: Never Smoker  . Smokeless tobacco: Never Used  Substance Use Topics  . Alcohol use: Yes    Comment: rare   . Drug use: No     Allergies   Penicillins   Review of Systems Review of Systems  Constitutional: Positive for fatigue. Negative for chills, fever and unexpected weight change.  HENT: Positive for congestion.   Respiratory: Positive for cough  and shortness of breath.   Cardiovascular: Positive for chest pain.  Gastrointestinal: Negative for abdominal pain.  Genitourinary: Negative for dysuria.  All other systems reviewed and are negative.    Physical Exam Updated Vital Signs BP (!) 156/84   Pulse 83   Temp 99.1 F (37.3 C) (Rectal)   Resp (!) 29   Ht 6\' 2"  (1.88 m)   Wt 89.8 kg (198 lb)   SpO2 92%   BMI 25.42 kg/m   Physical Exam  Constitutional: He appears well-developed and well-nourished.  HENT:  Head: Normocephalic and atraumatic.  Eyes: Conjunctivae are normal.  Neck: Neck supple.  Cardiovascular: Normal rate, regular rhythm and intact distal pulses.  No murmur heard. Pulmonary/Chest: Effort normal. No respiratory distress. He has decreased breath sounds. He has rhonchi. He has rales.  Abdominal: Soft. There is no tenderness.  Musculoskeletal: Normal range of motion.       Right lower leg: He exhibits edema. He exhibits no tenderness.       Left lower leg: He exhibits edema. He exhibits no tenderness.  Neurological: He is  alert.  Skin: Skin is warm and dry.  Psychiatric: He has a normal mood and affect.  Nursing note and vitals reviewed.    ED Treatments / Results  Labs (all labs ordered are listed, but only abnormal results are displayed) Labs Reviewed  BASIC METABOLIC PANEL - Abnormal; Notable for the following components:      Result Value   Chloride 100 (*)    Glucose, Bld 216 (*)    BUN 132 (*)    Creatinine, Ser 8.53 (*)    Calcium 6.2 (*)    GFR calc non Af Amer 6 (*)    GFR calc Af Amer 7 (*)    Anion gap 16 (*)    All other components within normal limits  CBC - Abnormal; Notable for the following components:   RBC 3.21 (*)    Hemoglobin 9.5 (*)    HCT 29.3 (*)    All other components within normal limits  MAGNESIUM - Abnormal; Notable for the following components:   Magnesium 2.5 (*)    All other components within normal limits  PHOSPHORUS - Abnormal; Notable for the following  components:   Phosphorus 8.0 (*)    All other components within normal limits  BRAIN NATRIURETIC PEPTIDE - Abnormal; Notable for the following components:   B Natriuretic Peptide 1,003.6 (*)    All other components within normal limits  I-STAT CG4 LACTIC ACID, ED - Abnormal; Notable for the following components:   Lactic Acid, Venous 0.39 (*)    All other components within normal limits  CULTURE, BLOOD (ROUTINE X 2)  CULTURE, BLOOD (ROUTINE X 2)  RESPIRATORY PANEL BY PCR  CULTURE, EXPECTORATED SPUTUM-ASSESSMENT  GRAM STAIN  HIV ANTIBODY (ROUTINE TESTING)  STREP PNEUMONIAE URINARY ANTIGEN  LEGIONELLA PNEUMOPHILA SEROGP 1 UR AG  HEMOGLOBIN A1C  LIPID PANEL  TROPONIN I  TROPONIN I  TROPONIN I  I-STAT TROPONIN, ED  I-STAT CG4 LACTIC ACID, ED    EKG EKG Interpretation  Date/Time:  Tuesday May 19 2018 12:59:08 EDT Ventricular Rate:  83 PR Interval:  194 QRS Duration: 160 QT Interval:  478 QTC Calculation: 561 R Axis:   -35 Text Interpretation:  Normal sinus rhythm Left axis deviation Left bundle branch block No significant change since last tracing Confirmed by Blanchie Dessert [phone]) on 05/19/2018 8:19:17 PM   Radiology Dg Chest 2 View  Result Date: 05/19/2018 CLINICAL DATA:  Chest tightness, shortness of breath, stage 4 kidney disease EXAM: CHEST - 2 VIEW COMPARISON:  Chest x-ray of 03/02/2018 FINDINGS: There has been worsening basilar opacities and pneumonia cannot be excluded. Also there is now a right pleural effusion some which tracks into the minor fissure. Superimposed edema cannot be excluded. The heart is mildly enlarged and stable. No bony abnormality is seen. IMPRESSION: 1. Interval worsening of airspace disease particularly at the lung bases right greater than left with moderate size right pleural effusion. Favor pneumonia but superimposed edema may be present as well. 2. Stable mild cardiomegaly. Electronically Signed   By: Ivar Drape M.D.   On: 05/19/2018 13:40    Ct Chest Wo Contrast  Result Date: 05/19/2018 CLINICAL DATA:  Chest tightness and shortness of breath. History of pneumonia. EXAM: CT CHEST WITHOUT CONTRAST TECHNIQUE: Multidetector CT imaging of the chest was performed following the standard protocol without IV contrast. COMPARISON:  Chest x-ray earlier today. CT of the chest on 06/05/2012 FINDINGS: Cardiovascular: The heart size is within normal limits. There is a small amount of pericardial fluid.  The thoracic aorta and central pulmonary arteries are normal in caliber. Mediastinum/Nodes: There are some small mediastinal lymph nodes present. The largest lower right paratracheal lymph node measures 1.4 cm in short axis. Thyroid gland, trachea, and esophagus demonstrate no significant findings. Lungs/Pleura: Prominent airspace disease and very prominent bronchial thickening and edema localizes mostly to the lower lobes bilaterally but also the lingula, left upper lobe, right upper lobe and right middle lobe. Associated calcific material is also present in the lung parenchyma of the lower lobes bilaterally which was not present on the prior CT in 2013. This suggests chronic aspiration with possible interval aspiration of barium and or other calcific material since 2013. There are small bilateral pleural effusions with some right pleural fluid extending into the major and minor fissures. Upper Abdomen: No acute abnormality. Musculoskeletal: No chest wall mass or suspicious bone lesions identified. IMPRESSION: 1. Prominent bilateral pneumonia involving all lobes of both lungs with associated very prominent bronchial thickening. Associated small bilateral pleural effusions as well as calcific material in both lower lobes consistent with chronic aspiration of either barium or other calcific material. This calcific material was not present in the lung parenchyma in 2013. 2. Small mediastinal lymph nodes which are borderline in size and could be reactive based on the  prominent pneumonia present. The largest measures 1.4 cm in short axis in the lower right paratracheal station. 3. Small pericardial effusion. Electronically Signed   By: Aletta Edouard M.D.   On: 05/19/2018 20:16    Procedures Procedures (including critical care time)  Medications Ordered in ED Medications  vancomycin (VANCOCIN) IVPB 1000 mg/200 mL premix (1,000 mg Intravenous New Bag/Given 05/19/18 2228)  amLODipine (NORVASC) tablet 10 mg (has no administration in time range)  carvedilol (COREG) tablet 37.5 mg (has no administration in time range)  calcium gluconate 2 g in sodium chloride 0.9 % 100 mL IVPB (has no administration in time range)  furosemide (LASIX) 120 mg in dextrose 5 % 50 mL IVPB (has no administration in time range)  insulin aspart (novoLOG) injection 0-9 Units (has no administration in time range)  insulin aspart (novoLOG) injection 0-5 Units (has no administration in time range)  albuterol (PROVENTIL) (2.5 MG/3ML) 0.083% nebulizer solution 2.5 mg (has no administration in time range)  dextromethorphan-guaiFENesin (MUCINEX DM) 30-600 MG per 12 hr tablet 1 tablet (has no administration in time range)  aspirin chewable tablet 81 mg (has no administration in time range)  multivitamin (RENA-VIT) tablet 1 tablet (has no administration in time range)  heparin injection 5,000 Units (has no administration in time range)  ceFEPIme (MAXIPIME) 1 g in sodium chloride 0.9 % 100 mL IVPB (has no administration in time range)  hydrALAZINE (APRESOLINE) injection 5 mg (has no administration in time range)  hydrOXYzine (ATARAX/VISTARIL) tablet 10 mg (has no administration in time range)  acetaminophen (TYLENOL) tablet 650 mg (has no administration in time range)  zolpidem (AMBIEN) tablet 5 mg (has no administration in time range)  nitroGLYCERIN (NITROSTAT) SL tablet 0.4 mg (has no administration in time range)  vancomycin (VANCOCIN) IVPB 750 mg/150 ml premix (has no administration in time  range)  magnesium sulfate IVPB 2 g 50 mL (0 g Intravenous Stopped 05/19/18 2100)  ceFEPIme (MAXIPIME) 2 g in sodium chloride 0.9 % 100 mL IVPB (0 g Intravenous Stopped 05/19/18 2228)     Initial Impression / Assessment and Plan / ED Course  I have reviewed the triage vital signs and the nursing notes.  Pertinent labs & imaging  results that were available during my care of the patient were reviewed by me and considered in my medical decision making (see chart for details).     Patient's laboratory work-up largely within normal limits without concerning abnormalities with the exception of various electrolyte abnormalities not requiring dialysis most significantly elevated BUN.  Patient without leukocytosis. Elevated BNP.  Chest x-ray shows large bilateral infiltrates.  Will collect CT of the chest for further evaluation.  Initial troponin negative.  Patient's chest CT shows findings consistent with large pneumonia with lymphadenopathy as well as peribronchial thickening.  Patient started on antibiotics but cultures collected.  Lactate within normal limits.  EKG largely unchanged from previous.  Will admit patient to the hospital for further evaluation and care.  CRITICAL CARE Performed by: Tanishia Lemaster Total critical care time: 30 minutes Critical care time was exclusive of separately billable procedures and treating other patients. Critical care was necessary to treat or prevent imminent or life-threatening deterioration. Critical care was time spent personally by me on the following activities: development of treatment plan with patient and/or surrogate as well as nursing, discussions with consultants, evaluation of patient's response to treatment, examination of patient, obtaining history from patient or surrogate, ordering and performing treatments and interventions, ordering and review of laboratory studies, ordering and review of radiographic studies, pulse oximetry and re-evaluation of  patient's condition.   Final Clinical Impressions(s) / ED Diagnoses   Final diagnoses:  Community acquired pneumonia, unspecified laterality  Hypoxia  ESRD (end stage renal disease) [facility])    ED Discharge Orders    None       Chapman Moss, MD 05/19/18 [id number]    Blanchie Dessert, MD 05/21/18 [id number]    Blanchie Dessert, MD 06/08/18 2320

## 2018-05-19 NOTE — ED Notes (Signed)
Pt requesting to eat

## 2018-05-19 NOTE — ED Triage Notes (Signed)
Patient to ED c/o SOB and chest tightness x 3 days, reports hx ESRD and DM type I - has noticed worsening SOB with exertion and non-productive cough. Patient denies fevers/chills. Resp e/u at rest, skin warm/dry. Patient SpO2 87% RA, placed on 2L O2 [place] - increased to 94%.

## 2018-05-20 ENCOUNTER — Other Ambulatory Visit: Payer: Self-pay

## 2018-05-20 ENCOUNTER — Encounter (HOSPITAL_COMMUNITY): Payer: Self-pay | Admitting: Interventional Radiology

## 2018-05-20 ENCOUNTER — Inpatient Hospital Stay (HOSPITAL_COMMUNITY): Payer: 59

## 2018-05-20 ENCOUNTER — Other Ambulatory Visit (HOSPITAL_COMMUNITY): Payer: 59

## 2018-05-20 DIAGNOSIS — R112 Nausea with vomiting, unspecified: Secondary | ICD-10-CM | POA: Diagnosis present

## 2018-05-20 DIAGNOSIS — R197 Diarrhea, unspecified: Secondary | ICD-10-CM | POA: Diagnosis present

## 2018-05-20 DIAGNOSIS — N185 Chronic kidney disease, stage 5: Secondary | ICD-10-CM

## 2018-05-20 DIAGNOSIS — I5033 Acute on chronic diastolic (congestive) heart failure: Secondary | ICD-10-CM

## 2018-05-20 DIAGNOSIS — J189 Pneumonia, unspecified organism: Secondary | ICD-10-CM

## 2018-05-20 DIAGNOSIS — J9601 Acute respiratory failure with hypoxia: Secondary | ICD-10-CM

## 2018-05-20 HISTORY — PX: IR FLUORO GUIDE CV LINE RIGHT: IMG2283

## 2018-05-20 HISTORY — PX: IR US GUIDE VASC ACCESS RIGHT: IMG2390

## 2018-05-20 LAB — RENAL FUNCTION PANEL
Albumin: 3 g/dL — ABNORMAL LOW (ref 3.5–5.0)
Albumin: 2.9 g/dL — ABNORMAL LOW (ref 3.5–5.0)
Anion gap: 17 — ABNORMAL HIGH (ref 5–15)
Anion gap: 13 (ref 5–15)
BUN: 130 mg/dL — ABNORMAL HIGH (ref 6–20)
BUN: 86 mg/dL — ABNORMAL HIGH (ref 6–20)
CO2: 24 mmol/L (ref 22–32)
CO2: 27 mmol/L (ref 22–32)
Calcium: 6.8 mg/dL — ABNORMAL LOW (ref 8.9–10.3)
Calcium: 7.3 mg/dL — ABNORMAL LOW (ref 8.9–10.3)
Chloride: 98 mmol/L — ABNORMAL LOW (ref 101–111)
Chloride: 98 mmol/L — ABNORMAL LOW (ref 101–111)
Creatinine, Ser: 8.36 mg/dL — ABNORMAL HIGH (ref 0.61–1.24)
Creatinine, Ser: 6.22 mg/dL — ABNORMAL HIGH (ref 0.61–1.24)
GFR calc Af Amer: 8 mL/min — ABNORMAL LOW (ref >60)
GFR calc Af Amer: 11 mL/min — ABNORMAL LOW (ref >60)
GFR calc non Af Amer: 7 mL/min — ABNORMAL LOW (ref >60)
GFR calc non Af Amer: 9 mL/min — ABNORMAL LOW (ref >60)
Glucose, Bld: 263 mg/dL — ABNORMAL HIGH (ref 65–99)
Glucose, Bld: 87 mg/dL (ref 65–99)
Phosphorus: 8.4 mg/dL — ABNORMAL HIGH (ref 2.5–4.6)
Phosphorus: 5.9 mg/dL — ABNORMAL HIGH (ref 2.5–4.6)
Potassium: 4 mmol/L (ref 3.5–5.1)
Potassium: 3.4 mmol/L — ABNORMAL LOW (ref 3.5–5.1)
Sodium: 139 mmol/L (ref 135–145)
Sodium: 138 mmol/L (ref 135–145)

## 2018-05-20 LAB — CBG MONITORING, ED
Glucose-Capillary: 408 mg/dL — ABNORMAL HIGH (ref 65–99)
Glucose-Capillary: 227 mg/dL — ABNORMAL HIGH (ref 65–99)
Glucose-Capillary: 260 mg/dL — ABNORMAL HIGH (ref 65–99)

## 2018-05-20 LAB — CBC
HCT: 33.3 % — ABNORMAL LOW (ref 39.0–52.0)
HCT: 30.7 % — ABNORMAL LOW (ref 39.0–52.0)
Hemoglobin: 10.8 g/dL — ABNORMAL LOW (ref 13.0–17.0)
Hemoglobin: 10.1 g/dL — ABNORMAL LOW (ref 13.0–17.0)
MCH: 29.8 pg (ref 26.0–34.0)
MCH: 29.5 pg (ref 26.0–34.0)
MCHC: 32.4 g/dL (ref 30.0–36.0)
MCHC: 32.9 g/dL (ref 30.0–36.0)
MCV: 91.7 fL (ref 78.0–100.0)
MCV: 89.8 fL (ref 78.0–100.0)
Platelets: 234 10*3/uL (ref 150–400)
Platelets: 234 10*3/uL (ref 150–400)
RBC: 3.63 MIL/uL — ABNORMAL LOW (ref 4.22–5.81)
RBC: 3.42 MIL/uL — ABNORMAL LOW (ref 4.22–5.81)
RDW: 12.3 % (ref 11.5–15.5)
RDW: 12.4 % (ref 11.5–15.5)
WBC: 10.1 10*3/uL (ref 4.0–10.5)
WBC: 10.8 10*3/uL — ABNORMAL HIGH (ref 4.0–10.5)

## 2018-05-20 LAB — URINALYSIS, MICROSCOPIC (REFLEX)

## 2018-05-20 LAB — I-STAT ARTERIAL BLOOD GAS, ED
Acid-base deficit: 3 mmol/L — ABNORMAL HIGH (ref 0.0–2.0)
Bicarbonate: 21.7 mmol/L (ref 20.0–28.0)
O2 Saturation: 82 %
Patient temperature: 98.6
TCO2: 23 mmol/L (ref 22–32)
pCO2 arterial: 38.2 mmHg (ref 32.0–48.0)
pH, Arterial: 7.362 (ref 7.350–7.450)
pO2, Arterial: 47 mmHg — ABNORMAL LOW (ref 83.0–108.0)

## 2018-05-20 LAB — URINALYSIS, ROUTINE W REFLEX MICROSCOPIC
Bilirubin Urine: NEGATIVE
Glucose, UA: NEGATIVE mg/dL
Ketones, ur: NEGATIVE mg/dL
Leukocytes, UA: NEGATIVE
Nitrite: NEGATIVE
Protein, ur: 100 mg/dL — AB
Specific Gravity, Urine: 1.015 (ref 1.005–1.030)
pH: 5.5 (ref 5.0–8.0)

## 2018-05-20 LAB — IRON AND TIBC
Iron: 18 ug/dL — ABNORMAL LOW (ref 45–182)
Saturation Ratios: 8 % — ABNORMAL LOW (ref 17.9–39.5)
TIBC: 227 ug/dL — ABNORMAL LOW (ref 250–450)
UIBC: 209 ug/dL

## 2018-05-20 LAB — HEMOGLOBIN A1C
Hgb A1c MFr Bld: 7.2 % — ABNORMAL HIGH (ref 4.8–5.6)
Mean Plasma Glucose: 159.94 mg/dL

## 2018-05-20 LAB — HIV ANTIBODY (ROUTINE TESTING W REFLEX): HIV Screen 4th Generation wRfx: NONREACTIVE

## 2018-05-20 LAB — GLUCOSE, CAPILLARY
Glucose-Capillary: 108 mg/dL — ABNORMAL HIGH (ref 65–99)
Glucose-Capillary: 175 mg/dL — ABNORMAL HIGH (ref 65–99)

## 2018-05-20 LAB — LIPID PANEL
Cholesterol: 134 mg/dL (ref 0–200)
HDL: 40 mg/dL — ABNORMAL LOW (ref >40)
LDL Cholesterol: 76 mg/dL (ref 0–99)
Total CHOL/HDL Ratio: 3.4 RATIO
Triglycerides: 88 mg/dL (ref <150)
VLDL: 18 mg/dL (ref 0–40)

## 2018-05-20 LAB — LIPASE, BLOOD: Lipase: 32 U/L (ref 11–51)

## 2018-05-20 LAB — TROPONIN I
Troponin I: 0.04 ng/mL (ref <0.03)
Troponin I: 0.04 ng/mL (ref <0.03)
Troponin I: 0.05 ng/mL (ref <0.03)

## 2018-05-20 LAB — STREP PNEUMONIAE URINARY ANTIGEN: Strep Pneumo Urinary Antigen: NEGATIVE

## 2018-05-20 LAB — MRSA PCR SCREENING: MRSA by PCR: NEGATIVE

## 2018-05-20 MED ORDER — RENA-VITE PO TABS: 1.0000 | ORAL_TABLET | Freq: Every day | ORAL | Status: DC

## 2018-05-20 MED ORDER — CHLORHEXIDINE GLUCONATE CLOTH 2 % EX PADS
6.0000 | MEDICATED_PAD | Freq: Every day | CUTANEOUS | Status: DC
  Administered 2018-05-21 – 2018-05-25 (×4): 6 via TOPICAL

## 2018-05-20 MED ORDER — INSULIN GLARGINE 100 UNIT/ML ~~LOC~~ SOLN
26.0000 [IU] | Freq: Every day | SUBCUTANEOUS | Status: DC
  Administered 2018-05-20 – 2018-05-23 (×3): 26 [IU] via SUBCUTANEOUS
  Filled 2018-05-20 (×4): qty 0.26

## 2018-05-20 MED ORDER — LIDOCAINE HCL (PF) 1 % IJ SOLN: 5.0000 mL | INTRAMUSCULAR | Status: DC | PRN

## 2018-05-20 MED ORDER — PENTAFLUOROPROP-TETRAFLUOROETH EX AERO: 1.0000 "application " | INHALATION_SPRAY | CUTANEOUS | Status: DC | PRN

## 2018-05-20 MED ORDER — ALTEPLASE 2 MG IJ SOLR: 2.0000 mg | Freq: Once | INTRAMUSCULAR | Status: DC | PRN

## 2018-05-20 MED ORDER — LIDOCAINE-PRILOCAINE 2.5-2.5 % EX CREA
1.0000 "application " | TOPICAL_CREAM | CUTANEOUS | Status: DC | PRN
  Filled 2018-05-20: qty 5

## 2018-05-20 MED ORDER — HEPARIN SODIUM (PORCINE) 1000 UNIT/ML DIALYSIS
100.0000 [IU]/kg | INTRAMUSCULAR | Status: DC | PRN
  Filled 2018-05-20: qty 9

## 2018-05-20 MED ORDER — SODIUM CHLORIDE 0.9 % IV SOLN: 100.0000 mL | INTRAVENOUS | Status: DC | PRN

## 2018-05-20 MED ORDER — HEPARIN SODIUM (PORCINE) 1000 UNIT/ML IJ SOLN
INTRAMUSCULAR | Status: AC
  Filled 2018-05-20: qty 1

## 2018-05-20 MED ORDER — LANTHANUM CARBONATE 500 MG PO CHEW
1000.0000 mg | CHEWABLE_TABLET | Freq: Three times a day (TID) | ORAL | Status: DC
  Administered 2018-05-21 – 2018-05-25 (×11): 1000 mg via ORAL
  Filled 2018-05-20 (×16): qty 2

## 2018-05-20 MED ORDER — CARVEDILOL 25 MG PO TABS
25.0000 mg | ORAL_TABLET | Freq: Two times a day (BID) | ORAL | Status: DC
  Administered 2018-05-21 – 2018-05-23 (×5): 25 mg via ORAL
  Filled 2018-05-20 (×3): qty 1
  Filled 2018-05-20: qty 2
  Filled 2018-05-20: qty 1

## 2018-05-20 MED ORDER — DARBEPOETIN ALFA 150 MCG/0.3ML IJ SOSY
150.0000 ug | PREFILLED_SYRINGE | INTRAMUSCULAR | Status: DC
  Filled 2018-05-20: qty 0.3

## 2018-05-20 MED ORDER — INSULIN ASPART 100 UNIT/ML ~~LOC~~ SOLN
5.0000 [IU] | Freq: Once | SUBCUTANEOUS | Status: AC
  Administered 2018-05-20: 5 [IU] via SUBCUTANEOUS
  Filled 2018-05-20: qty 1

## 2018-05-20 MED ORDER — FUROSEMIDE 10 MG/ML IJ SOLN
160.0000 mg | Freq: Four times a day (QID) | INTRAVENOUS | Status: DC
  Administered 2018-05-20 – 2018-05-21 (×5): 160 mg via INTRAVENOUS
  Filled 2018-05-20 (×6): qty 16
  Filled 2018-05-20: qty 10
  Filled 2018-05-20 (×2): qty 16

## 2018-05-20 MED ORDER — AMLODIPINE BESYLATE 10 MG PO TABS
10.0000 mg | ORAL_TABLET | Freq: Every day | ORAL | Status: DC
  Administered 2018-05-20 – 2018-05-24 (×5): 10 mg via ORAL
  Filled 2018-05-20 (×5): qty 1

## 2018-05-20 MED ORDER — HYDROXYZINE HCL 50 MG/ML IM SOLN
25.0000 mg | Freq: Four times a day (QID) | INTRAMUSCULAR | Status: DC | PRN
  Administered 2018-05-20: 25 mg via INTRAMUSCULAR
  Filled 2018-05-20: qty 0.5

## 2018-05-20 MED ORDER — HEPARIN SODIUM (PORCINE) 1000 UNIT/ML DIALYSIS
20.0000 [IU]/kg | INTRAMUSCULAR | Status: DC | PRN
  Filled 2018-05-20: qty 2

## 2018-05-20 MED ORDER — CYCLOBENZAPRINE HCL 10 MG PO TABS
5.0000 mg | ORAL_TABLET | Freq: Once | ORAL | Status: AC
  Administered 2018-05-20: 5 mg via ORAL
  Filled 2018-05-20: qty 1

## 2018-05-20 MED ORDER — HEPARIN SODIUM (PORCINE) 1000 UNIT/ML DIALYSIS
1000.0000 [IU] | INTRAMUSCULAR | Status: DC | PRN
  Filled 2018-05-20: qty 1

## 2018-05-20 MED ORDER — LIDOCAINE HCL 1 % IJ SOLN
INTRAMUSCULAR | Status: AC
  Filled 2018-05-20: qty 20

## 2018-05-20 MED ORDER — INSULIN DEGLUDEC 100 UNIT/ML ~~LOC~~ SOPN: 26.0000 [IU] | PEN_INJECTOR | Freq: Every day | SUBCUTANEOUS | Status: DC

## 2018-05-20 MED ORDER — LIDOCAINE-PRILOCAINE 2.5-2.5 % EX CREA: 1.0000 "application " | TOPICAL_CREAM | CUTANEOUS | Status: DC | PRN

## 2018-05-20 MED ORDER — SODIUM CHLORIDE 0.9 % IV SOLN
100.0000 mL | INTRAVENOUS | Status: DC | PRN
  Administered 2018-05-21: 100 mL via INTRAVENOUS

## 2018-05-20 MED ORDER — SODIUM CHLORIDE 0.9 % IV SOLN
1.0000 g | INTRAVENOUS | Status: DC
  Administered 2018-05-21: 1 g via INTRAVENOUS
  Filled 2018-05-20: qty 1

## 2018-05-20 MED ORDER — INSULIN GLARGINE 100 UNIT/ML ~~LOC~~ SOLN
5.0000 [IU] | Freq: Every day | SUBCUTANEOUS | Status: DC
  Filled 2018-05-20: qty 0.05

## 2018-05-20 MED ORDER — CHLORHEXIDINE GLUCONATE CLOTH 2 % EX PADS
6.0000 | MEDICATED_PAD | Freq: Every day | CUTANEOUS | Status: DC
  Administered 2018-05-21 – 2018-05-22 (×2): 6 via TOPICAL

## 2018-05-20 MED ORDER — HEPARIN SODIUM (PORCINE) 1000 UNIT/ML DIALYSIS: 1000.0000 [IU] | INTRAMUSCULAR | Status: DC | PRN

## 2018-05-20 MED ORDER — INSULIN ASPART 100 UNIT/ML ~~LOC~~ SOLN
0.0000 [IU] | Freq: Three times a day (TID) | SUBCUTANEOUS | Status: DC
  Administered 2018-05-20: 5 [IU] via SUBCUTANEOUS
  Administered 2018-05-21: 1 [IU] via SUBCUTANEOUS
  Administered 2018-05-21: 2 [IU] via SUBCUTANEOUS
  Administered 2018-05-21: 1 [IU] via SUBCUTANEOUS
  Administered 2018-05-22: 3 [IU] via SUBCUTANEOUS
  Administered 2018-05-23: 2 [IU] via SUBCUTANEOUS
  Administered 2018-05-23 (×2): 5 [IU] via SUBCUTANEOUS
  Administered 2018-05-24: 2 [IU] via SUBCUTANEOUS
  Administered 2018-05-24: 3 [IU] via SUBCUTANEOUS
  Administered 2018-05-24: 1 [IU] via SUBCUTANEOUS
  Filled 2018-05-20: qty 1

## 2018-05-20 NOTE — Progress Notes (Signed)
RT placed BiPAP on patient and patient stated he wasn't able to tolerate the BiPAP.  RT explained the importance of wearing the BiPAP for his SOB and O2 demand. Patient stated that he isn't able to wear at this time but will think about it.

## 2018-05-20 NOTE — Progress Notes (Signed)
PROGRESS NOTE    [name] [name]  WUJ:[id number] DOB: [birth year] DOA: 05/19/2018 PCP: Christain Sacramento, MD  Brief Narrative:this is a 52 year old male with history of type 2 diabetes, hypertension, progressive CKD 5 presented to the emergency room with cough, dyspnea, nausea and vomiting. -in the emergency room room noted to have pulmonary edema, BUN of 130, creatinine of 8.3, potassium and CO2 were within normal range. -had a CT chest which is concerning for pulmonary edema, pleural effusions and bilateral infiltrates  Assessment & Plan:   Pulmonary edema -Due to progressive CKD5 -Renal consulting, plan for urgent HD catheter today followed by dialysis -CT chest: also notes areas concerning for infiltrate/pneumonia, agree with empiric antibiotic coverage for now however clinically less likely to be pneumonia -continue IV lasix 160mg  for now -Repeat chest x-ray post dialysis and volume removal  Acute hypoxic respiratory failure -Due to pulmonary edema as noted above, currently on a nonrebreather mask -Needs urgent HD awaiting dialysis catheter, updated IR PA with to request to expedite   CKD 5/New ESRD -as above  Acute on chronic diastolic CHF -volume overloaded state due to CKD 5 -as above  Type 2 diabetes mellitus -Last hemoglobin A1c was 6.3 in April -Continue Tresiba will decrease dose, sliding scale insulin  Anemia due to chronic kidney disease -epo with HD  DVT prophylaxis:heparin subcutaneous Code Status: for code Family Communication:friend at bedside Disposition Plan: admit to stepdown  Consultants:   Nephrology  Interventional radiology   Procedures:   Antimicrobials:    Subjective: -complains of shortness of breath  Objective: Vitals:   05/20/18 1015 05/20/18 1130 05/20/18 1230 05/20/18 1245  BP:   (!) 152/92 (!) 151/84  Pulse: 87 83 88 90  Resp: (!) 26 19 19    Temp:      TempSrc:      SpO2: 100% 100% 99% 97%  Weight:      Height:          Intake/Output Summary (Last 24 hours) at 05/20/2018 1258 Last data filed at 05/20/2018 0434 Gross per 24 hour  Intake 50 ml  Output 725 ml  Net -675 ml   Filed Weights   05/19/18 1307  Weight: 89.8 kg (198 lb)    Examination:  General exam: ill-appearing middle-aged male, uncomfortable Respiratory system: bilateral rales Cardiovascular system: S1 & S2 heard, /regular rate rhythm, tachycardic  Gastrointestinal system: Abdomen is nondistended, soft and nontender.Normal bowel sounds heard. Central nervous system: Alert and oriented. No focal neurological deficits. Extremities: 2+ edema Skin: No rashes, lesions or ulcers Psychiatry: Judgement and insight appear normal. Mood & affect appropriate.     Data Reviewed:   CBC: Recent Labs  Lab 05/19/18 1320 05/20/18 0415  WBC 8.3 10.1  HGB 9.5* 10.8*  HCT 29.3* 33.3*  MCV 91.3 91.7  PLT 207 956   Basic Metabolic Panel: Recent Labs  Lab 05/19/18 1320 05/19/18 1728 05/20/18 0415  NA 139  --  139  K 4.0  --  4.0  CL 100*  --  98*  CO2 23  --  24  GLUCOSE 216*  --  263*  BUN 132*  --  130*  CREATININE 8.53*  --  8.36*  CALCIUM 6.2*  --  6.8*  MG  --  2.5*  --   PHOS  --  8.0* 8.4*   GFR: Estimated Creatinine Clearance: 12.2 mL/min (A) (by C-G formula based on SCr of 8.36 mg/dL (H)). Liver Function Tests: Recent Labs  Lab 05/20/18 0415  ALBUMIN  3.0*   Recent Labs  Lab 05/20/18 0057  LIPASE 32   No results for input(s): AMMONIA in the last 168 hours. Coagulation Profile: No results for input(s): INR, PROTIME in the last 168 hours. Cardiac Enzymes: Recent Labs  Lab 05/19/18 2343 05/20/18 0415  TROPONINI 0.04* 0.04*   BNP (last 3 results) No results for input(s): PROBNP in the last 8760 hours. HbA1C: Recent Labs    05/20/18 0415  HGBA1C 7.2*   CBG: Recent Labs  Lab 05/20/18 0121 05/20/18 0801 05/20/18 1209  GLUCAP 408* 227* 260*   Lipid Profile: Recent Labs    05/20/18 0415  CHOL 134   HDL 40*  LDLCALC 76  TRIG 88  CHOLHDL 3.4   Thyroid Function Tests: No results for input(s): TSH, T4TOTAL, FREET4, T3FREE, THYROIDAB in the last 72 hours. Anemia Panel: Recent Labs    05/20/18 0415  TIBC 227*  IRON 18*   Urine analysis:    Component Value Date/Time   COLORURINE YELLOW 05/20/2018 0711   APPEARANCEUR HAZY (A) 05/20/2018 0711   LABSPEC 1.015 05/20/2018 0711   PHURINE 5.5 05/20/2018 0711   GLUCOSEU NEGATIVE 05/20/2018 0711   HGBUR SMALL (A) 05/20/2018 0711   BILIRUBINUR NEGATIVE 05/20/2018 0711   BILIRUBINUR negative 04/20/2018 1452   KETONESUR NEGATIVE 05/20/2018 0711   PROTEINUR 100 (A) 05/20/2018 0711   UROBILINOGEN 0.2 04/20/2018 1452   UROBILINOGEN 0.2 04/09/2012 2229   NITRITE NEGATIVE 05/20/2018 0711   LEUKOCYTESUR NEGATIVE 05/20/2018 0711   Sepsis Labs: @LABRCNTIP (procalcitonin:4,lacticidven:4)  )No results found for this or any previous visit (from the past 240 hour(s)).       Radiology Studies: Dg Chest 2 View  Result Date: 05/19/2018 CLINICAL DATA:  Chest tightness, shortness of breath, stage 4 kidney disease EXAM: CHEST - 2 VIEW COMPARISON:  Chest x-ray of 03/02/2018 FINDINGS: There has been worsening basilar opacities and pneumonia cannot be excluded. Also there is now a right pleural effusion some which tracks into the minor fissure. Superimposed edema cannot be excluded. The heart is mildly enlarged and stable. No bony abnormality is seen. IMPRESSION: 1. Interval worsening of airspace disease particularly at the lung bases right greater than left with moderate size right pleural effusion. Favor pneumonia but superimposed edema may be present as well. 2. Stable mild cardiomegaly. Electronically Signed   By: Ivar Drape M.D.   On: 05/19/2018 13:40   Ct Chest Wo Contrast  Result Date: 05/19/2018 CLINICAL DATA:  Chest tightness and shortness of breath. History of pneumonia. EXAM: CT CHEST WITHOUT CONTRAST TECHNIQUE: Multidetector CT imaging of  the chest was performed following the standard protocol without IV contrast. COMPARISON:  Chest x-ray earlier today. CT of the chest on 06/05/2012 FINDINGS: Cardiovascular: The heart size is within normal limits. There is a small amount of pericardial fluid. The thoracic aorta and central pulmonary arteries are normal in caliber. Mediastinum/Nodes: There are some small mediastinal lymph nodes present. The largest lower right paratracheal lymph node measures 1.4 cm in short axis. Thyroid gland, trachea, and esophagus demonstrate no significant findings. Lungs/Pleura: Prominent airspace disease and very prominent bronchial thickening and edema localizes mostly to the lower lobes bilaterally but also the lingula, left upper lobe, right upper lobe and right middle lobe. Associated calcific material is also present in the lung parenchyma of the lower lobes bilaterally which was not present on the prior CT in 2013. This suggests chronic aspiration with possible interval aspiration of barium and or other calcific material since 2013. There are small bilateral  pleural effusions with some right pleural fluid extending into the major and minor fissures. Upper Abdomen: No acute abnormality. Musculoskeletal: No chest wall mass or suspicious bone lesions identified. IMPRESSION: 1. Prominent bilateral pneumonia involving all lobes of both lungs with associated very prominent bronchial thickening. Associated small bilateral pleural effusions as well as calcific material in both lower lobes consistent with chronic aspiration of either barium or other calcific material. This calcific material was not present in the lung parenchyma in 2013. 2. Small mediastinal lymph nodes which are borderline in size and could be reactive based on the prominent pneumonia present. The largest measures 1.4 cm in short axis in the lower right paratracheal station. 3. Small pericardial effusion. Electronically Signed   By: Aletta Edouard M.D.   On:  05/19/2018 20:16        Scheduled Meds: . amLODipine  10 mg Oral QHS  . aspirin  81 mg Oral Daily  . carvedilol  37.5 mg Oral BID WC  . Chlorhexidine Gluconate Cloth  6 each Topical Q0600  . darbepoetin (ARANESP) injection - DIALYSIS  150 mcg Intravenous Q Wed-HD  . heparin  5,000 Units Subcutaneous Q8H  . insulin aspart  0-9 Units Subcutaneous TID WC  . insulin glargine  26 Units Subcutaneous Daily  . lanthanum  1,000 mg Oral TID WC  . multivitamin  1 tablet Oral QHS   Continuous Infusions: . [START ON 05/21/2018] ceFEPime (MAXIPIME) IV    . furosemide Stopped (05/20/18 0606)     LOS: 1 day    Time spent: 70min    Domenic Polite, MD [facility] Page via [email or web address], password [email or web address] After 7PM please contact night-coverage  05/20/2018, 12:58 PM

## 2018-05-20 NOTE — ED Notes (Signed)
Pt c/o worsening chest tightness when lying back against the bed; pt assisted to side of bed for comfort and placed on NRB per Dr.Niu

## 2018-05-20 NOTE — Procedures (Signed)
I was present at this session.  I have reviewed the session itself and made appropriate changes.  HD via temp RIJ cath.  Discussed goals. bp Marland Kitchen, lower vol  [place] Area 5/29/20193:22 PM

## 2018-05-20 NOTE — ED Notes (Signed)
Pt requesting to be taken off of NRB; placed on 3L of [place], sats immediately dropped to 85%, placed on 5L  sats only at 87%. Pt placed back on NRB, sats 90%

## 2018-05-20 NOTE — ED Notes (Signed)
Pt transported to IR 

## 2018-05-20 NOTE — Progress Notes (Signed)
Patient does not wear CPAP or BiPAP will continue to monitor pt as needed.

## 2018-05-20 NOTE — ED Notes (Signed)
Patient transported to CT 

## 2018-05-20 NOTE — ED Notes (Signed)
Patient is sitting on the side of the bed. Denies any pain at present. Breakfast tray given.

## 2018-05-20 NOTE — Progress Notes (Signed)
He remains SOB and will be unable to be supine for an IJ cath so have requested a fem HD cath by IR Dialysis orders in place Estanislado Emms, MD

## 2018-05-20 NOTE — ED Notes (Signed)
Pt returned from imaging. Family remains at the bedside.
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
## 2018-05-20 NOTE — Progress Notes (Signed)
RT heard the BiPAP alarming and entered the patient's room and he had taken the BiPAP off and stating was unable to continue to wear at this time. RT placed the NRB back on the patient and notified the RN.

## 2018-05-20 NOTE — ED Notes (Signed)
Dr. Deterding at bedside.  

## 2018-05-20 NOTE — Progress Notes (Signed)
Called er to get report. No answer. [name] [name]

## 2018-05-20 NOTE — ED Notes (Signed)
Patient vomited 200 mL; Admitting MD paged.

## 2018-05-20 NOTE — ED Notes (Signed)
Checked patient cbg it was 30 notified RN of blood sugar patient is resting with family at bedside and call bell in reach

## 2018-05-20 NOTE — Procedures (Signed)
Interventional Radiology Procedure Note  Procedure: Placement of a 20cm right IJ approach non-tunneled HD catheter.  Tips in the RA and read for use.   Complications: None  Estimated Blood Loss: None  Recommendations: - To Dialysis  Signed,  Criselda Peaches, MD

## 2018-05-20 NOTE — Progress Notes (Signed)
Attempted echo in PM.  Patient unavailable & at Interventional Radiology.  Will attempt at a later time.

## 2018-05-20 NOTE — Consult Note (Signed)
Reason for Consult:CKD5, uremia Referring Physician: Dr. Jodi Mourning is an 52 y.o. male.  HPI: 52 yr old male with long hx DM, HTN , and progressive renal dz, stage 5. Has refused to prepare, get access, or PD cath ahead of time . Now with progressive SOB at rest, N, V, cramping, and found to have Cr of 8.5/BUN 132.  Presented to ED on advice after prob at home.  Has appetite, can  Breathe setting up, has dry cough, chest tightness also.  Had wanted to do PD but refused to get PD cath for elective start (per Dr. Joelyn Oms his primary Nephrologist. Constitutional: as above, feels bad Eyes: negative Ears, nose, mouth, throat, and face: negative Respiratory: cough , dyspnea Cardiovascular: edema, SOB Gastrointestinal: N, V Genitourinary:negative Integument/breast: negative Hematologic/lymphatic: On ESA Musculoskeletal:ms cramps Neurological: negative Endocrine: DM Allergic/Immunologic: negative   Primary Nephrologist [facility]. .  Past Medical History:  Diagnosis Date  . Bacteremia   . Diabetes mellitus   . Hypertension   . Pneumonia 02/2012   Strep pneumoniae bilateral pneumonia complicated by bacteremia  . Renal disorder    End stage, not on dialysis    Past Surgical History:  Procedure Laterality Date  . CHEST TUBE INSERTION     placed during hospitalization 02/2012  . COLONOSCOPY  04/01/2012   Procedure: COLONOSCOPY;  Surgeon: Juanita Craver, MD;  Location: [facility] ENDOSCOPY;  Service: Endoscopy;  Laterality: N/A;  . ESOPHAGOGASTRODUODENOSCOPY  03/31/2012   Procedure: ESOPHAGOGASTRODUODENOSCOPY (EGD);  Surgeon: Beryle Beams, MD;  Location: [facility] ENDOSCOPY;  Service: Endoscopy;  Laterality: N/A;    Family History  Problem Relation Age of Onset  . Diabetes Maternal Grandmother     Social History:  reports that he has never smoked. He has never used smokeless tobacco. He reports that he drinks alcohol. He reports that he does not use drugs.  Allergies:  Allergies   Allergen Reactions  . Penicillins Rash    Tolerating Ceftriaxone (03/12/12) Has patient had a PCN reaction causing immediate rash, facial/tongue/throat swelling, SOB or lightheadedness with hypotension: Yes Has patient had a PCN reaction causing severe rash involving mucus membranes or skin necrosis: Unk Has patient had a PCN reaction that required hospitalization: No Has patient had a PCN reaction occurring within the last 10 years: No If all of the above answers are "NO", then may proceed with Cephalosporin use.     Medications: I have reviewed the patient's current medications.  , Aranesp   Results for orders placed or performed during the hospital encounter of 05/19/18 (from the past 48 hour(s))  Basic metabolic panel     Status: Abnormal   Collection Time: 05/19/18  1:20 PM  Result Value Ref Range   Sodium 139 135 - 145 mmol/L   Potassium 4.0 3.5 - 5.1 mmol/L   Chloride 100 (L) 101 - 111 mmol/L   CO2 23 22 - 32 mmol/L   Glucose, Bld 216 (H) 65 - 99 mg/dL   BUN 132 (H) 6 - 20 mg/dL   Creatinine, Ser 8.53 (H) 0.61 - 1.24 mg/dL   Calcium 6.2 (LL) 8.9 - 10.3 mg/dL    Comment: CRITICAL RESULT CALLED TO, READ BACK BY AND VERIFIED WITH: Bernita Raisin RN 1434 05/19/2018 BY A BENNETT     GFR calc non Af Amer 6 (L) >60 mL/min   GFR calc Af Amer 7 (L) >60 mL/min    Comment: (NOTE) The eGFR has been calculated using the CKD EPI equation. This calculation has  not been validated in all clinical situations. eGFR's persistently <60 mL/min signify possible Chronic Kidney Disease.    Anion gap 16 (H) 5 - 15    Comment: Performed at [facility], [place] [address] [place], [place] [address]  CBC     Status: Abnormal   Collection Time: 05/19/18  1:20 PM  Result Value Ref Range   WBC 8.3 4.0 - 10.5 K/uL   RBC 3.21 (L) 4.22 - 5.81 MIL/uL   Hemoglobin 9.5 (L) 13.0 - 17.0 g/dL   HCT 29.3 (L) 39.0 - 52.0 %   MCV 91.3 78.0 - 100.0 fL   MCH 29.6 26.0 - 34.0 pg   MCHC 32.4 30.0 - 36.0 g/dL    RDW 12.5 11.5 - 15.5 %   Platelets 207 150 - 400 K/uL    Comment: Performed at [facility], [place] [address], [place], [place] [address]  I-stat troponin, ED     Status: None   Collection Time: 05/19/18  2:02 PM  Result Value Ref Range   Troponin i, poc 0.02 0.00 - 0.08 ng/mL   Comment 3            Comment: Due to the release kinetics of cTnI, a negative result within the first hours of the onset of symptoms does not rule out myocardial infarction with certainty. If myocardial infarction is still suspected, repeat the test at appropriate intervals.   Magnesium     Status: Abnormal   Collection Time: 05/19/18  5:28 PM  Result Value Ref Range   Magnesium 2.5 (H) 1.7 - 2.4 mg/dL    Comment: Performed at [facility] [address], [place], [place] [address]  Phosphorus     Status: Abnormal   Collection Time: 05/19/18  5:28 PM  Result Value Ref Range   Phosphorus 8.0 (H) 2.5 - 4.6 mg/dL    Comment: Performed at [facility] [address] [place], [place] [address]  Brain natriuretic peptide     Status: Abnormal   Collection Time: 05/19/18  5:28 PM  Result Value Ref Range   B Natriuretic Peptide 1,003.6 (H) 0.0 - 100.0 pg/mL    Comment: Performed at [facility] [address] [place], [place] [address]  I-Stat CG4 Lactic Acid, ED     Status: None   Collection Time: 05/19/18  5:43 PM  Result Value Ref Range   Lactic Acid, Venous 0.54 0.5 - 1.9 mmol/L  I-Stat CG4 Lactic Acid, ED     Status: Abnormal   Collection Time: 05/19/18  7:26 PM  Result Value Ref Range   Lactic Acid, Venous 0.39 (L) 0.5 - 1.9 mmol/L  Troponin I (q 6hr x 3)     Status: Abnormal   Collection Time: 05/19/18 11:43 PM  Result Value Ref Range   Troponin I 0.04 (HH) <0.03 ng/mL    Comment: CRITICAL RESULT CALLED TO, READ BACK BY AND VERIFIED WITH: Rich Reining Nunzio Cory 05/20/18 0035 WAYK Performed at [facility], [place] [address], [place], [place] [address]   Lipase, blood      Status: None   Collection Time: 05/20/18 12:57 AM  Result Value Ref Range   Lipase 32 11 - 51 U/L    Comment: Performed at [facility] [address] [place],  [address]  I-Stat arterial blood gas, ED     Status: Abnormal   Collection Time: 05/20/18  1:09 AM  Result Value Ref Range  pH, Arterial 7.362 7.350 - 7.450   pCO2 arterial 38.2 32.0 - 48.0 mmHg   pO2, Arterial 47.0 (L) 83.0 - 108.0 mmHg   Bicarbonate 21.7 20.0 - 28.0 mmol/L   TCO2 23 22 - 32 mmol/L   O2 Saturation 82.0 %   Acid-base deficit 3.0 (H) 0.0 - 2.0 mmol/L   Patient temperature 98.6 F    Collection site RADIAL, ALLEN'S TEST ACCEPTABLE    Drawn by RT    Sample type ARTERIAL   CBG monitoring, ED     Status: Abnormal   Collection Time: 05/20/18  1:21 AM  Result Value Ref Range   Glucose-Capillary 408 (H) 65 - 99 mg/dL    Dg Chest 2 View  Result Date: 05/19/2018 CLINICAL DATA:  Chest tightness, shortness of breath, stage 4 kidney disease EXAM: CHEST - 2 VIEW COMPARISON:  Chest x-ray of 03/02/2018 FINDINGS: There has been worsening basilar opacities and pneumonia cannot be excluded. Also there is now a right pleural effusion some which tracks into the minor fissure. Superimposed edema cannot be excluded. The heart is mildly enlarged and stable. No bony abnormality is seen. IMPRESSION: 1. Interval worsening of airspace disease particularly at the lung bases right greater than left with moderate size right pleural effusion. Favor pneumonia but superimposed edema may be present as well. 2. Stable mild cardiomegaly. Electronically Signed   By: Ivar Drape M.D.   On: 05/19/2018 13:40   Ct Chest Wo Contrast  Result Date: 05/19/2018 CLINICAL DATA:  Chest tightness and shortness of breath. History of pneumonia. EXAM: CT CHEST WITHOUT CONTRAST TECHNIQUE: Multidetector CT imaging of the chest was performed following the standard protocol without IV contrast. COMPARISON:  Chest x-ray earlier today. CT of the chest on  06/05/2012 FINDINGS: Cardiovascular: The heart size is within normal limits. There is a small amount of pericardial fluid. The thoracic aorta and central pulmonary arteries are normal in caliber. Mediastinum/Nodes: There are some small mediastinal lymph nodes present. The largest lower right paratracheal lymph node measures 1.4 cm in short axis. Thyroid gland, trachea, and esophagus demonstrate no significant findings. Lungs/Pleura: Prominent airspace disease and very prominent bronchial thickening and edema localizes mostly to the lower lobes bilaterally but also the lingula, left upper lobe, right upper lobe and right middle lobe. Associated calcific material is also present in the lung parenchyma of the lower lobes bilaterally which was not present on the prior CT in 2013. This suggests chronic aspiration with possible interval aspiration of barium and or other calcific material since 2013. There are small bilateral pleural effusions with some right pleural fluid extending into the major and minor fissures. Upper Abdomen: No acute abnormality. Musculoskeletal: No chest wall mass or suspicious bone lesions identified. IMPRESSION: 1. Prominent bilateral pneumonia involving all lobes of both lungs with associated very prominent bronchial thickening. Associated small bilateral pleural effusions as well as calcific material in both lower lobes consistent with chronic aspiration of either barium or other calcific material. This calcific material was not present in the lung parenchyma in 2013. 2. Small mediastinal lymph nodes which are borderline in size and could be reactive based on the prominent pneumonia present. The largest measures 1.4 cm in short axis in the lower right paratracheal station. 3. Small pericardial effusion. Electronically Signed   By: Aletta Edouard M.D.   On: 05/19/2018 20:16    ROS Blood pressure (!) 163/91, pulse 82, temperature 99.1 F (37.3 C), temperature source Rectal, resp. rate (!)  34, height 6' 2"  (1.88  m), weight 89.8 kg (198 lb), SpO2 100 %. Physical Exam Physical Examination: General appearance - pale, mild dyspnea on mask Mental status - alert, oriented to person, place, and time Eyes - pupils equal and reactive, extraocular eye movements intact, funduscopic exam normal, discs flat and sharp Ears - bilateral TM's and external ear canals normal Mouth - mucous membranes moist, pharynx normal without lesions Neck - adenopathy noted PCL Lymphatics - posterior cervical nodes Chest - rales in bases, exp wheezes Heart - S1 and S2 normal, S4 present, systolic murmur ZJ2/5 at apex and LV lift Abdomen - pos bs, liver down 6 cm soft Musculoskeletal - no joint tenderness, deformity or swelling Extremities - pedal edema 2-3 +, DP 1+ Skin - sallow, some erosions  Assessment/Plan: 1 CKD 5 with progressive vol xs, uremic. Needs to start dialysis. Will get PC and perm access. Has passed window for elective PD, but will consider in future. His refusal to prepare has caused this crash into dialysis and ^ morbidity and mortality. 2 DM controlled 3 Hypertension: lower vol , give meds 4. Anemia of ESRD: eval and tx  5. Metabolic Bone Disease: check PTH, start binder 6 NONADHERENCE ^ risk or complic P Hd cath in am, If dyspneic will need temp, if less, PC, Perm access later in week, start binders, esa, check PTH  Jeneen Rinks Shaquna Geigle 05/20/2018, 3:46 AM

## 2018-05-21 ENCOUNTER — Other Ambulatory Visit (HOSPITAL_COMMUNITY): Payer: 59

## 2018-05-21 ENCOUNTER — Inpatient Hospital Stay (HOSPITAL_COMMUNITY): Payer: 59

## 2018-05-21 DIAGNOSIS — Z01810 Encounter for preprocedural cardiovascular examination: Secondary | ICD-10-CM

## 2018-05-21 DIAGNOSIS — N185 Chronic kidney disease, stage 5: Secondary | ICD-10-CM

## 2018-05-21 DIAGNOSIS — E119 Type 2 diabetes mellitus without complications: Secondary | ICD-10-CM

## 2018-05-21 DIAGNOSIS — I10 Essential (primary) hypertension: Secondary | ICD-10-CM

## 2018-05-21 LAB — CBC
HCT: 28.5 % — ABNORMAL LOW (ref 39.0–52.0)
Hemoglobin: 9.4 g/dL — ABNORMAL LOW (ref 13.0–17.0)
MCH: 30.2 pg (ref 26.0–34.0)
MCHC: 33 g/dL (ref 30.0–36.0)
MCV: 91.6 fL (ref 78.0–100.0)
Platelets: 204 10*3/uL (ref 150–400)
RBC: 3.11 MIL/uL — ABNORMAL LOW (ref 4.22–5.81)
RDW: 12.5 % (ref 11.5–15.5)
WBC: 9.4 10*3/uL (ref 4.0–10.5)

## 2018-05-21 LAB — RENAL FUNCTION PANEL
Albumin: 2.4 g/dL — ABNORMAL LOW (ref 3.5–5.0)
Anion gap: 12 (ref 5–15)
BUN: 90 mg/dL — ABNORMAL HIGH (ref 6–20)
CO2: 28 mmol/L (ref 22–32)
Calcium: 7 mg/dL — ABNORMAL LOW (ref 8.9–10.3)
Chloride: 100 mmol/L — ABNORMAL LOW (ref 101–111)
Creatinine, Ser: 6.75 mg/dL — ABNORMAL HIGH (ref 0.61–1.24)
GFR calc Af Amer: 10 mL/min — ABNORMAL LOW (ref >60)
GFR calc non Af Amer: 8 mL/min — ABNORMAL LOW (ref >60)
Glucose, Bld: 152 mg/dL — ABNORMAL HIGH (ref 65–99)
Phosphorus: 6.6 mg/dL — ABNORMAL HIGH (ref 2.5–4.6)
Potassium: 3.4 mmol/L — ABNORMAL LOW (ref 3.5–5.1)
Sodium: 140 mmol/L (ref 135–145)

## 2018-05-21 LAB — HEPATITIS B SURFACE ANTIGEN
Hepatitis B Surface Ag: NEGATIVE
Hepatitis B Surface Ag: NEGATIVE

## 2018-05-21 LAB — GLUCOSE, CAPILLARY
Glucose-Capillary: 147 mg/dL — ABNORMAL HIGH (ref 65–99)
Glucose-Capillary: 132 mg/dL — ABNORMAL HIGH (ref 65–99)
Glucose-Capillary: 176 mg/dL — ABNORMAL HIGH (ref 65–99)
Glucose-Capillary: 223 mg/dL — ABNORMAL HIGH (ref 65–99)

## 2018-05-21 LAB — HEPATITIS B CORE ANTIBODY, TOTAL: Hep B Core Total Ab: POSITIVE — AB

## 2018-05-21 LAB — HEPATITIS B SURFACE ANTIBODY,QUALITATIVE
Hep B S Ab: NONREACTIVE
Hep B S Ab: NONREACTIVE

## 2018-05-21 LAB — PARATHYROID HORMONE, INTACT (NO CA): PTH: 81 pg/mL — ABNORMAL HIGH (ref 15–65)

## 2018-05-21 LAB — LEGIONELLA PNEUMOPHILA SEROGP 1 UR AG: L. pneumophila Serogp 1 Ur Ag: NEGATIVE

## 2018-05-21 LAB — CALCITRIOL (1,25 DI-OH VIT D): Vit D, 1,25-Dihydroxy: 28.7 pg/mL (ref 19.9–79.3)

## 2018-05-21 LAB — HCV COMMENT:

## 2018-05-21 LAB — HEPATITIS B CORE ANTIBODY, IGM: Hep B C IgM: NEGATIVE

## 2018-05-21 LAB — HEPATITIS C ANTIBODY (REFLEX): HCV Ab: <0.1 s/co ratio (ref 0.0–0.9)

## 2018-05-21 MED ORDER — CYCLOBENZAPRINE HCL 10 MG PO TABS
5.0000 mg | ORAL_TABLET | Freq: Once | ORAL | Status: AC
  Administered 2018-05-21: 5 mg via ORAL
  Filled 2018-05-21: qty 1

## 2018-05-21 MED ORDER — FUROSEMIDE 10 MG/ML IJ SOLN
160.0000 mg | Freq: Four times a day (QID) | INTRAVENOUS | Status: DC
  Administered 2018-05-22 (×2): 160 mg via INTRAVENOUS
  Filled 2018-05-21 (×8): qty 16

## 2018-05-21 MED ORDER — SODIUM CHLORIDE 0.9 % IV SOLN
125.0000 mg | Freq: Every day | INTRAVENOUS | Status: DC
  Administered 2018-05-21 – 2018-05-23 (×2): 125 mg via INTRAVENOUS
  Filled 2018-05-21 (×5): qty 10

## 2018-05-21 MED ORDER — VANCOMYCIN HCL IN DEXTROSE 750-5 MG/150ML-% IV SOLN: 750.0000 mg | INTRAVENOUS | Status: DC

## 2018-05-21 NOTE — Progress Notes (Signed)
[facility] Rounding Note  Background 52 yr old male with long hx DM, HTN , and progressive renal dz, stage 5. Had refused to prepare, get access, or PD cath ahead of time (was his preferred stated modality . Progressive SOB at rest, N, V, cramping, and found to have Cr of 8.5/BUN 132.  New ESRD.   Assessment/Recommendations   1. CKD 5 with progressive vol xs, uremic. New ESRD 1. Had HD#1 yesterday via temp cath placed by IR. 1.7 liters off. Has orders for #2 today, #3 tomorrow  2. Consult to [facility] for AVF/AVG and to tunnel catheter (I called [facility]) 3. CLIP (probably will CLIP to [facility]) 4. Has passed window for elective PD, but will consider in future. 2.  DM controlled 3.  Hypertension: lower vol , give meds 4.  Anemia of ESRD: TSat low. Fe load. 8 daily doses ordered (to avoid missing doses w/irreg HD at this time). For Aranesp with HD today. 5.  Metabolic Bone Disease: PTH PENDING. Lanthanum with meals started 6.  NONADHERENCE ^ risk or complic  Interval Events Temp cath and HD#1 for #2 today  BP (!) 164/94 (BP Location: Right Arm)   Pulse 94   Temp 98.5 F (36.9 C) (Oral)   Resp 19   Ht 6\' 2"  (1.88 m)   Wt 87.5 kg (192 lb 14.4 oz)   SpO2 98%   BMI 24.77 kg/m   Physical examination Nice middle aged AAM Readying to go for2nd HD TMT Says just feels tired R IJ temp cath in place Bilateral crackles 1/2 up posteriorly S1S2 No S3 or rub Abd soft and not tender 2+ LE edema pitting  Recent Labs    05/19/18 1728 05/20/18 0415 05/20/18 1950  NA  --  139 138  K  --  4.0 3.4*  CL  --  98* 98*  CO2  --  24 27  GLUCOSE  --  263* 87  BUN  --  130* 86*  CREATININE  --  8.36* 6.22*  CALCIUM  --  6.8* 7.3*  MG 2.5*  --   --   PHOS 8.0* 8.4* 5.9*    Recent Labs    05/20/18 0415 05/20/18 1950  ALBUMIN 3.0* 2.9*    Recent Labs    05/20/18 0415 05/20/18 1950  WBC 10.1 10.8*  HGB 10.8* 10.1*  HCT 33.3* 30.7*  MCV 91.7 89.8  PLT 234 234    Recent Labs   05/20/18 0415  TIBC 227*  IRON 18*  TSat                    8%  Scheduled Medications . amLODipine  10 mg Oral QHS  . aspirin  81 mg Oral Daily  . carvedilol  25 mg Oral BID WC  . Chlorhexidine Gluconate Cloth  6 each Topical Q0600  . Chlorhexidine Gluconate Cloth  6 each Topical Q0600  . darbepoetin (ARANESP) injection - DIALYSIS  150 mcg Intravenous Q Wed-HD  . heparin  5,000 Units Subcutaneous Q8H  . insulin aspart  0-9 Units Subcutaneous TID WC  . insulin glargine  26 Units Subcutaneous Daily  . lanthanum  1,000 mg Oral TID WC  . multivitamin  1 tablet Oral QHS

## 2018-05-21 NOTE — Progress Notes (Signed)
Patient complained of leg cramps in the anterior region of bilateral legs.  Bolus of 177mls of NS at 999 mls/hr given per prn order. [facility] paged via [place] for intervention for leg cramps.  One time dose of 5mg  of flexeril ordered and given.  Will monitor patient.

## 2018-05-21 NOTE — Consult Note (Addendum)
Hospital Consult    Reason for Consult: Permanent access and tunneled dialysis catheter placement Requesting Physician: Dr. Lorrene Reid MRN #:  [id number]  History of Present Illness: This is a 52 y.o. male with past medical history significant for diabetes mellitus, hypertension, CKD stage V admitted to the hospital with shortness of breath and progression to end-stage renal disease.  He was started on hemodialysis yesterday status post right IJ non-tunneled dialysis catheter placement by interventional radiology.  He was seen in consultation and evaluation for exchange to a tunneled dialysis catheter as well as permanent access creation.  He is right arm dominant and would prefer placement in left arm.  Vein mapping of bilateral upper extremities has been ordered but not yet performed.  Patient is not taking any blood thinners.  He was seen during hemodialysis today and per nephrology as scheduled for another treatment tomorrow.  Patient had preferred to start on peritoneal dialysis however is aware that he will need to be on hemodialysis per nephrology for period of time.  Past Medical History:  Diagnosis Date  . Bacteremia   . Diabetes mellitus   . Hypertension   . Pneumonia 02/2012   Strep pneumoniae bilateral pneumonia complicated by bacteremia  . Renal disorder    End stage, not on dialysis    Past Surgical History:  Procedure Laterality Date  . CHEST TUBE INSERTION     placed during hospitalization 02/2012  . COLONOSCOPY  04/01/2012   Procedure: COLONOSCOPY;  Surgeon: Juanita Craver, MD;  Location[facility] ENDOSCOPY;  Service: Endoscopy;  Laterality: N/A;  . ESOPHAGOGASTRODUODENOSCOPY  03/31/2012   Procedure: ESOPHAGOGASTRODUODENOSCOPY (EGD);  Surgeon: Beryle Beams, MD;  Location: [facility] ENDOSCOPY;  Service: Endoscopy;  Laterality: N/A;  . IR FLUORO GUIDE CV LINE RIGHT  05/20/2018  . IR US GUIDE VASC ACCESS RIGHT  05/20/2018    Allergies  Allergen Reactions  . Penicillins Rash    Tolerating  Ceftriaxone (03/12/12) Has patient had a PCN reaction causing immediate rash, facial/tongue/throat swelling, SOB or lightheadedness with hypotension: Yes Has patient had a PCN reaction causing severe rash involving mucus membranes or skin necrosis: Unk Has patient had a PCN reaction that required hospitalization: No Has patient had a PCN reaction occurring within the last 10 years: No If all of the above answers are "NO", then may proceed with Cephalosporin use.     Prior to Admission medications   Medication Sig Start Date End Date Taking? Authorizing Provider  amLODipine (NORVASC) 10 MG tablet Take 10 mg by mouth daily.  03/21/17  Yes [provider]  aspirin 81 MG tablet Take 81 mg by mouth every morning.    Yes [provider]  B Complex-C-Folic Acid (RENAL VITAMIN PO) Take 1 tablet by mouth daily.    Yes [provider]  carvedilol (COREG) 25 MG tablet Take 1.5 tablets (37.5 mg total) by mouth 2 (two) times daily with a meal. 03/25/17  Yes Troy Sine, MD  Darbepoetin Alfa (ARANESP) 100 MCG/0.5ML SOSY injection Inject 100 mcg into the skin every 28 (twenty-eight) days.   Yes [provider]  furosemide (LASIX) 80 MG tablet Take 160 mg by mouth 2 (two) times daily.  03/15/17  Yes [provider]  insulin aspart (NOVOLOG FLEXPEN) 100 UNIT/ML FlexPen 5-15 units before meals Patient taking differently: Inject 0-11 Units into the skin See admin instructions. 0-11 units three times a day as needed before meals, per sliding scale 04/08/17  Yes Elayne Snare, MD  metolazone (ZAROXOLYN) 2.5  MG tablet Take 1 tablet (2.5 mg total) by mouth every other day. Patient taking differently: Take 2.5 mg by mouth See admin instructions. Mondays, Wednesdays, and Fridays 03/03/18  Yes Rama, Venetia Maxon, MD  TRESIBA FLEXTOUCH 100 UNIT/ML SOPN FlexTouch Pen INJECT 0.38 MLS (38 UNITS TOTAL) INTO THE SKIN DAILY AT 10 PM. Patient taking differently: Inject 38 Units into the  skin at bedtime.  05/16/17  Yes Elayne Snare, MD  Vitamin D, Ergocalciferol, (DRISDOL) 50000 units CAPS capsule Take 50,000 Units by mouth every Sunday.    Yes [provider]  Continuous Blood Gluc Receiver (FREESTYLE LIBRE READER) DEVI 1 Device by Does not apply route as directed. 03/31/17   Elayne Snare, MD  Continuous Blood Gluc Sensor (FREESTYLE LIBRE SENSOR SYSTEM) MISC Apply sensor to upper arm and change sensor every 10 days. 03/26/18   Elayne Snare, MD  cyclobenzaprine (FLEXERIL) 5 MG tablet 1 pill by mouth up to every 8 hours as needed. Start with one pill by mouth each bedtime as needed due to sedation Patient not taking: Reported on 05/19/2018 04/20/18   Wendie Agreste, MD  Insulin Pen Needle 31G X 5 MM MISC Use to inject Tyler Aas once daily 03/28/17   Elayne Snare, MD    Social History   Socioeconomic History  . Marital status: Divorced    Spouse name: Not on file  . Number of children: Not on file  . Years of education: Not on file  . Highest education level: Not on file  Occupational History  . Occupation: Information systems manager: [employer]  Social Needs  . Financial resource strain: Not on file  . Food insecurity:    Worry: Not on file    Inability: Not on file  . Transportation needs:    Medical: Not on file    Non-medical: Not on file  Tobacco Use  . Smoking status: Never Smoker  . Smokeless tobacco: Never Used  Substance and Sexual Activity  . Alcohol use: Yes    Comment: rare   . Drug use: No  . Sexual activity: Not on file  Lifestyle  . Physical activity:    Days per week: Not on file    Minutes per session: Not on file  . Stress: Not on file  Relationships  . Social connections:    Talks on phone: Not on file    Gets together: Not on file    Attends religious service: Not on file    Active member of club or organization: Not on file    Attends meetings of clubs or organizations: Not on file    Relationship status: Not on file  . Intimate  partner violence:    Fear of current or ex partner: Not on file    Emotionally abused: Not on file    Physically abused: Not on file    Forced sexual activity: Not on file  Other Topics Concern  . Not on file  Social History Narrative   He is from [place] and lives at home here with his daughter. He is divorced. 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      Family History  Problem Relation Age of Onset  . Diabetes Maternal Grandmother     ROS: Otherwise negative unless mentioned in HPI  Physical Examination  Vitals:   05/21/18 1000 05/21/18 1030  BP: (!) 154/84 (!) 158/90  Pulse: 87 85  Resp:  Temp:    SpO2:     Body mass index is 24.51 kg/m.  General:  WDWN in NAD Gait: Not observed HENT: WNL, normocephalic Pulmonary: normal non-labored breathing Cardiac: regular, without  Murmurs, rubs or gallops Abdomen:  soft, NT/ND, no masses Skin: without rashes Vascular Exam/Pulses: Symmetrical radial pulses; symmetrical DP pulses with no sign of tissue ischemia; traumatic abrasion right mid shin Extremities: without ischemic changes, without Gangrene , without cellulitis; without open wounds;  Musculoskeletal: no muscle wasting or atrophy  Neurologic: A&O X 3;  No focal weakness or paresthesias are detected; speech is fluent/normal Psychiatric:  The pt has Normal affect. Lymph:  Unremarkable  CBC    Component Value Date/Time   WBC 9.4 05/21/2018 1011   RBC 3.11 (L) 05/21/2018 1011   HGB 9.4 (L) 05/21/2018 1011   HCT 28.5 (L) 05/21/2018 1011   PLT 204 05/21/2018 1011   MCV 91.6 05/21/2018 1011   MCV 0.0 (A) 03/02/2018 1312   MCH 30.2 05/21/2018 1011   MCHC 33.0 05/21/2018 1011   RDW 12.5 05/21/2018 1011   LYMPHSABS 1.4 03/02/2018 1820   MONOABS 0.5 03/02/2018 1820   EOSABS 0.3 03/02/2018 1820   BASOSABS 0.0 03/02/2018 1820    BMET    Component Value Date/Time   NA 140 05/21/2018 1012   NA 132 (L) 01/13/2017 1200    K 3.4 (L) 05/21/2018 1012   CL 100 (L) 05/21/2018 1012   CO2 28 05/21/2018 1012   GLUCOSE 152 (H) 05/21/2018 1012   BUN 90 (H) 05/21/2018 1012   BUN 63 (H) 01/13/2017 1200   CREATININE 6.75 (H) 05/21/2018 1012   CREATININE 1.40 (H) 04/23/2012 1433   CALCIUM 7.0 (L) 05/21/2018 1012   CALCIUM 6.5 03/06/2018 0907   GFRNONAA 8 (L) 05/21/2018 1012   GFRNONAA 60 04/23/2012 1433   GFRAA 10 (L) 05/21/2018 1012   GFRAA 70 04/23/2012 1433    COAGS: Lab Results  Component Value Date   INR 1.22 04/09/2012   INR 1.05 03/13/2012   INR 1.12 03/12/2012     Non-Invasive Vascular Imaging:   Vein mapping pending  Statin:  No. Beta Blocker:  Yes.   Aspirin:  Yes.   ACEI:  No. ARB:  No. CCB use:  Yes Other antiplatelets/anticoagulants:  No.    ASSESSMENT/PLAN: This is a 52 y.o. male with end-stage renal disease initiated on hemodialysis during this admission  -Vein mapping ordered but not yet performed -Patient would prefer placement of permanent access in left arm; restrict left arm -Plan will be for tunneled dialysis catheter right IJ as well as left arm AV fistula versus graft during this admission -On-call surgeon Dr. Donzetta Matters will be evaluating the patient later today    Dagoberto Ligas PA-C [facility] [phone]   I have independently interviewed and examined patient and agree with PA assessment and plan above.  Patient will need upper extremity fistula versus graft as well as conversion to tunneled dialysis catheter.  We will get vein mapping today.  If he has vein in his left upper extremity will need to switch his IVs to the right prior to surgery tomorrow.  N.p.o. past midnight.  Nazaret Chea C. Donzetta Matters, MD [facility] [place] Office: [phone] Pager: [phone]

## 2018-05-21 NOTE — Progress Notes (Signed)
Pharmacy Antibiotic Note  [name] [name] is a 52 y.o. male admitted on 05/19/2018 with pneumonia. Pharmacy has been consulted for Vancomycin dosing along with Zosyn per MD.   The patient is a new start to HD and tolerated a short HD session at a lower BFR on 5/29 (2.5 hr BFR 200) and is receiving HD again today - currently still at a lower BFR or 200. The patient received a full loading dose of 1750 mg - will give a reduced Vancomycin dose after HD today. Contacted RN in HD to inform of dose to be given.  Plan: - Give 750 mg post HD today - Will check a VR on 5/31 AM to ensure levels are appropriate - Given MRSA PCR neg - could consider d/c Vancomycin - Will continue to follow HD schedule/duration, culture results, LOT, and antibiotic de-escalation plans   Height: 6\' 2"  (188 cm) Weight: 190 lb 14.7 oz (86.6 kg)(standing) IBW/kg (Calculated) : 82.2  Temp (24hrs), Avg:98.5 F (36.9 C), Min:97.5 F (36.4 C), Max:99.5 F (37.5 C)  Recent Labs  Lab 05/19/18 1320 05/19/18 1743 05/19/18 1926 05/20/18 0415 05/20/18 1950 05/21/18 1011  WBC 8.3  --   --  10.1 10.8* 9.4  CREATININE 8.53*  --   --  8.36* 6.22*  --   LATICACIDVEN  --  0.54 0.39*  --   --   --     Estimated Creatinine Clearance: 16.3 mL/min (A) (by C-G formula based on SCr of 6.22 mg/dL (H)).    Allergies  Allergen Reactions  . Penicillins Rash    Tolerating Ceftriaxone (03/12/12) Has patient had a PCN reaction causing immediate rash, facial/tongue/throat swelling, SOB or lightheadedness with hypotension: Yes Has patient had a PCN reaction causing severe rash involving mucus membranes or skin necrosis: Unk Has patient had a PCN reaction that required hospitalization: No Has patient had a PCN reaction occurring within the last 10 years: No If all of the above answers are "NO", then may proceed with Cephalosporin use.      Thank you for allowing pharmacy to be a part of this patient's care.  Lawson Radar 05/21/2018 10:44 AM

## 2018-05-21 NOTE — Procedures (Signed)
Patient was seen on dialysis and the procedure was supervised.  BFR 200  Via vascath BP is  154/84.   Patient appears to be tolerating treatment well- reports cramping - K  Is low on 4 K bath   [name] [name] A 05/21/2018

## 2018-05-21 NOTE — Progress Notes (Signed)
CSW acknowledging consult for "outpatient hemodialysis". Outpatient hemodialysis is set up through the nephrology/dialysis departments at the hospital. They will work on clipping the patient when he is ready to begin the search for an outpatient dialysis center.  Please consult if CSW need arises.  Laveda Abbe,  Clinical Social Worker [phone]

## 2018-05-21 NOTE — Progress Notes (Signed)
Preliminary results by tech -  Upper extremity vein mapping. Oda Cogan, BS, RDMS, RVT

## 2018-05-21 NOTE — Progress Notes (Signed)
PROGRESS NOTE    [name] [name]  WOE:[id number] DOB: 22-Jan-1966 DOA: 05/19/2018 PCP: Christain Sacramento, MD  Brief Narrative:this is a 52 year old male with history of type 2 diabetes, hypertension, progressive CKD 5 presented to the emergency room with cough, dyspnea, nausea and vomiting. -in the emergency room room noted to have pulmonary edema, BUN of 130, creatinine of 8.3, potassium and CO2 were within normal range. -had a CT chest which is concerning for pulmonary edema, pleural effusions and bilateral infiltrates  Assessment & Plan:   Pulmonary edema/Progressive CKD5 -Renal consulting, s/p urgent HD catheter 5/29 followed by dialysis-first Rx 5/29 -improving -CT chest: also notes areas concerning for infiltrate/pneumonia, on empiric antibiotic coverage for now however clinically less likely to be pneumonia -stop Vancomycin, continue Cefepime today, repeat CXR tomorrow -per Renal, wean O2 -Plan for access, VVS consulted  Acute hypoxic respiratory failure -Due to pulmonary edema as noted above, required nonrebreather mask yestedray -improving and with HD, still with high O2 needs -repeat CXR in am not  CKD 5/New ESRD -as above  Acute on chronic diastolic CHF -volume overloaded state due to CKD 5 -as above  Type 2 diabetes mellitus -Last hemoglobin A1c was 6.3 in April -Continue Tresiba will decrease dose, sliding scale insulin  Anemia due to chronic kidney disease -epo with HD  DVT prophylaxis:heparin subcutaneous Code Status: for code Family Communication:friend at bedside Disposition Plan: home when pulm edema improved and clip complete  Consultants:   Nephrology  Interventional radiology   Procedures:   Antimicrobials:    Subjective: -breathing improving, + nausea  Objective: Vitals:   05/21/18 1130 05/21/18 1200 05/21/18 1226 05/21/18 1340  BP: (!) 158/87 108/68 (!) 146/91 (!) 154/90  Pulse: 84 83 88 87  Resp:   20   Temp:   98.8 F (37.1  C)   TempSrc:   Oral   SpO2:   100%   Weight:   84.7 kg (186 lb 11.7 oz)   Height:        Intake/Output Summary (Last 24 hours) at 05/21/2018 1428 Last data filed at 05/21/2018 1226 Gross per 24 hour  Intake 517.2 ml  Output 5548 ml  Net -5030.8 ml   Filed Weights   05/20/18 2000 05/21/18 0915 05/21/18 1226  Weight: 87.5 kg (192 lb 14.4 oz) 86.6 kg (190 lb 14.7 oz) 84.7 kg (186 lb 11.7 oz)    Examination:  Gen: Awake, Alert, Oriented X 3,  More comfortable, no distress HEENT: PERRLA, Neck supple, + JVD Lungs: B/L crackles CVS: RRR,No Gallops,Rubs or new Murmurs Abd: soft, Non tender, non distended, BS present Extremities: 1plus edema Skin: no new rashes Psychiatry: Judgement and insight appear normal. Mood & affect appropriate.     Data Reviewed:   CBC: Recent Labs  Lab 05/19/18 1320 05/20/18 0415 05/20/18 1950 05/21/18 1011  WBC 8.3 10.1 10.8* 9.4  HGB 9.5* 10.8* 10.1* 9.4*  HCT 29.3* 33.3* 30.7* 28.5*  MCV 91.3 91.7 89.8 91.6  PLT 207 234 234 003   Basic Metabolic Panel: Recent Labs  Lab 05/19/18 1320 05/19/18 1728 05/20/18 0415 05/20/18 1950 05/21/18 1012  NA 139  --  139 138 140  K 4.0  --  4.0 3.4* 3.4*  CL 100*  --  98* 98* 100*  CO2 23  --  24 27 28   GLUCOSE 216*  --  263* 87 152*  BUN 132*  --  130* 86* 90*  CREATININE 8.53*  --  8.36* 6.22* 6.75*  CALCIUM 6.2*  --  6.8* 7.3* 7.0*  MG  --  2.5*  --   --   --   PHOS  --  8.0* 8.4* 5.9* 6.6*   GFR: Estimated Creatinine Clearance: 15.1 mL/min (A) (by C-G formula based on SCr of 6.75 mg/dL (H)). Liver Function Tests: Recent Labs  Lab 05/20/18 0415 05/20/18 1950 05/21/18 1012  ALBUMIN 3.0* 2.9* 2.4*   Recent Labs  Lab 05/20/18 0057  LIPASE 32   No results for input(s): AMMONIA in the last 168 hours. Coagulation Profile: No results for input(s): INR, PROTIME in the last 168 hours. Cardiac Enzymes: Recent Labs  Lab 05/19/18 2343 05/20/18 0415 05/20/18 1046  TROPONINI 0.04*  0.04* 0.05*   BNP (last 3 results) No results for input(s): PROBNP in the last 8760 hours. HbA1C: Recent Labs    05/20/18 0415  HGBA1C 7.2*   CBG: Recent Labs  Lab 05/20/18 1209 05/20/18 1834 05/20/18 2148 05/21/18 0815 05/21/18 1405  GLUCAP 260* 108* 175* 147* 132*   Lipid Profile: Recent Labs    05/20/18 0415  CHOL 134  HDL 40*  LDLCALC 76  TRIG 88  CHOLHDL 3.4   Thyroid Function Tests: No results for input(s): TSH, T4TOTAL, FREET4, T3FREE, THYROIDAB in the last 72 hours. Anemia Panel: Recent Labs    05/20/18 0415  TIBC 227*  IRON 18*   Urine analysis:    Component Value Date/Time   COLORURINE YELLOW 05/20/2018 0711   APPEARANCEUR HAZY (A) 05/20/2018 0711   LABSPEC 1.015 05/20/2018 0711   PHURINE 5.5 05/20/2018 0711   GLUCOSEU NEGATIVE 05/20/2018 0711   HGBUR SMALL (A) 05/20/2018 0711   BILIRUBINUR NEGATIVE 05/20/2018 0711   BILIRUBINUR negative 04/20/2018 1452   KETONESUR NEGATIVE 05/20/2018 0711   PROTEINUR 100 (A) 05/20/2018 0711   UROBILINOGEN 0.2 04/20/2018 1452   UROBILINOGEN 0.2 04/09/2012 2229   NITRITE NEGATIVE 05/20/2018 0711   LEUKOCYTESUR NEGATIVE 05/20/2018 0711   Sepsis Labs: @LABRCNTIP (procalcitonin:4,lacticidven:4)  ) Recent Results (from the past 240 hour(s))  Blood culture (routine x 2)     Status: None (Preliminary result)   Collection Time: 05/19/18  9:14 PM  Result Value Ref Range Status   Specimen Description BLOOD LEFT ANTECUBITAL  Final   Special Requests   Final    BOTTLES DRAWN AEROBIC AND ANAEROBIC Blood Culture adequate volume   Culture   Final    NO GROWTH < 24 HOURS Performed at [facility], [place] [address], [place], [place] [address]    Report Status PENDING  Incomplete  Blood culture (routine x 2)     Status: None (Preliminary result)   Collection Time: 05/19/18  9:31 PM  Result Value Ref Range Status   Specimen Description BLOOD RIGHT HAND  Final   Special Requests   Final    BOTTLES DRAWN AEROBIC  AND ANAEROBIC Blood Culture adequate volume   Culture   Final    NO GROWTH < 24 HOURS Performed at [facility], [place] [address] [place], [place] [address]    Report Status PENDING  Incomplete  MRSA PCR Screening     Status: None   Collection Time: 05/20/18  6:28 PM  Result Value Ref Range Status   MRSA by PCR NEGATIVE NEGATIVE Final    Comment:        The GeneXpert MRSA Assay (FDA approved for NASAL specimens only), is one component of a comprehensive MRSA colonization surveillance program. It is not intended to diagnose MRSA infection nor to guide or monitor treatment  for MRSA infections. Performed at [facility], [place] [address], [place], [place] [address]          Radiology Studies: Ct Abdomen Pelvis Wo Contrast  Result Date: 05/20/2018 CLINICAL DATA:  Nausea and vomiting.  Abdominal pain. EXAM: CT ABDOMEN AND PELVIS WITHOUT CONTRAST TECHNIQUE: Multidetector CT imaging of the abdomen and pelvis was performed following the standard protocol without IV contrast. COMPARISON:  CT scan of the abdomen dated 04/04/2012 and CT scan of the chest dated 05/19/2018 FINDINGS: Lower chest: Persistent moderate bilateral pleural effusions. Slight progression of bilateral multifocal pulmonary infiltrates. Radiodense material at both lung bases as previously described. There is no pleural calcification at the right lung base posterior medially. Small pericardial effusion, unchanged. Hepatobiliary: Liver parenchyma appears normal. Subtle inhomogeneous density in the gallbladder could represent stones. Gallbladder wall is not thickened. No dilated bile ducts. Pancreas: Unremarkable. No pancreatic ductal dilatation or surrounding inflammatory changes. Spleen: Normal in size without focal abnormality. Adrenals/Urinary Tract: Tiny calcification in the upper pole of the otherwise normal left kidney. Right kidney is normal. Adrenal glands and ureters are normal. Bladder appears normal.  Stomach/Bowel: Stomach is within normal limits. Appendix appears normal. No evidence of bowel wall thickening, distention, or inflammatory changes. Vascular/Lymphatic: Aortic atherosclerosis. No enlarged abdominal or pelvic lymph nodes. Reproductive: Prostate is unremarkable. Other: No abdominal wall hernia or abnormality. No abdominopelvic ascites. Musculoskeletal: No acute or significant osseous findings. IMPRESSION: No acute abnormality of the abdomen or pelvis. Possible stones in the gallbladder. Aortic atherosclerosis. Bilateral pleural effusions with bilateral multifocal pulmonary infiltrates. Radiodense material in the lungs could represent aspirated barium. Pleural calcifications in the right hemithorax. Electronically Signed   By: Lorriane Shire M.D.   On: 05/20/2018 13:07   Ct Chest Wo Contrast  Result Date: 05/19/2018 CLINICAL DATA:  Chest tightness and shortness of breath. History of pneumonia. EXAM: CT CHEST WITHOUT CONTRAST TECHNIQUE: Multidetector CT imaging of the chest was performed following the standard protocol without IV contrast. COMPARISON:  Chest x-ray earlier today. CT of the chest on 06/05/2012 FINDINGS: Cardiovascular: The heart size is within normal limits. There is a small amount of pericardial fluid. The thoracic aorta and central pulmonary arteries are normal in caliber. Mediastinum/Nodes: There are some small mediastinal lymph nodes present. The largest lower right paratracheal lymph node measures 1.4 cm in short axis. Thyroid gland, trachea, and esophagus demonstrate no significant findings. Lungs/Pleura: Prominent airspace disease and very prominent bronchial thickening and edema localizes mostly to the lower lobes bilaterally but also the lingula, left upper lobe, right upper lobe and right middle lobe. Associated calcific material is also present in the lung parenchyma of the lower lobes bilaterally which was not present on the prior CT in 2013. This suggests chronic  aspiration with possible interval aspiration of barium and or other calcific material since 2013. There are small bilateral pleural effusions with some right pleural fluid extending into the major and minor fissures. Upper Abdomen: No acute abnormality. Musculoskeletal: No chest wall mass or suspicious bone lesions identified. IMPRESSION: 1. Prominent bilateral pneumonia involving all lobes of both lungs with associated very prominent bronchial thickening. Associated small bilateral pleural effusions as well as calcific material in both lower lobes consistent with chronic aspiration of either barium or other calcific material. This calcific material was not present in the lung parenchyma in 2013. 2. Small mediastinal lymph nodes which are borderline in size and could be reactive based on the prominent pneumonia present. The largest measures 1.4 cm in  short axis in the lower right paratracheal station. 3. Small pericardial effusion. Electronically Signed   By: Aletta Edouard M.D.   On: 05/19/2018 20:16   Ir Fluoro Guide Cv Line Right  Result Date: 05/20/2018 INDICATION: 52 year old male with end-stage renal disease now in need of hemodialysis. He presents for urgent Trialysis catheter placement. EXAM: IR ULTRASOUND GUIDANCE VASC ACCESS RIGHT; IR RIGHT FLOURO GUIDE CV LINE MEDICATIONS: None. ANESTHESIA/SEDATION: None. FLUOROSCOPY TIME:  Fluoroscopy Time: 0 minutes 6 seconds (1 mGy). COMPLICATIONS: None immediate. PROCEDURE: Informed written consent was obtained from the patient after a thorough discussion of the procedural risks, benefits and alternatives. All questions were addressed. Maximal Sterile Barrier Technique was utilized including caps, mask, sterile gowns, sterile gloves, sterile drape, hand hygiene and skin antiseptic. A timeout was performed prior to the initiation of the procedure. The right internal jugular vein was interrogated with ultrasound and found to be widely patent. An image was obtained  and stored for the medical record. Local anesthesia was attained by infiltration with 1% lidocaine. A small dermatotomy was made. Under real-time sonographic guidance, the vessel was punctured with a 19 gauge needle. Using standard technique, the needle was exchanged over a 0.035 wire for a dilator and the soft tissue tract was dilated. A 20 cm Trialysis catheter was then advanced over the wire and positioned with the tip in the mid right atrium. The catheter flushed and aspirated with ease. All 3 lumens were flushed and locked. The catheter was secured to the skin with 0 Prolene suture. A sterile bandage was applied. The patient tolerated the procedure well. IMPRESSION: Successful placement of a right IJ approach Trialysis catheter. The catheter tips are in the mid right atrium and ready for immediate use. Electronically Signed   By: Jacqulynn Cadet M.D.   On: 05/20/2018 15:13   Ir US Guide Vasc Access Right  Result Date: 05/20/2018 INDICATION: 52 year old male with end-stage renal disease now in need of hemodialysis. He presents for urgent Trialysis catheter placement. EXAM: IR ULTRASOUND GUIDANCE VASC ACCESS RIGHT; IR RIGHT FLOURO GUIDE CV LINE MEDICATIONS: None. ANESTHESIA/SEDATION: None. FLUOROSCOPY TIME:  Fluoroscopy Time: 0 minutes 6 seconds (1 mGy). COMPLICATIONS: None immediate. PROCEDURE: Informed written consent was obtained from the patient after a thorough discussion of the procedural risks, benefits and alternatives. All questions were addressed. Maximal Sterile Barrier Technique was utilized including caps, mask, sterile gowns, sterile gloves, sterile drape, hand hygiene and skin antiseptic. A timeout was performed prior to the initiation of the procedure. The right internal jugular vein was interrogated with ultrasound and found to be widely patent. An image was obtained and stored for the medical record. Local anesthesia was attained by infiltration with 1% lidocaine. A small dermatotomy was  made. Under real-time sonographic guidance, the vessel was punctured with a 19 gauge needle. Using standard technique, the needle was exchanged over a 0.035 wire for a dilator and the soft tissue tract was dilated. A 20 cm Trialysis catheter was then advanced over the wire and positioned with the tip in the mid right atrium. The catheter flushed and aspirated with ease. All 3 lumens were flushed and locked. The catheter was secured to the skin with 0 Prolene suture. A sterile bandage was applied. The patient tolerated the procedure well. IMPRESSION: Successful placement of a right IJ approach Trialysis catheter. The catheter tips are in the mid right atrium and ready for immediate use. Electronically Signed   By: Jacqulynn Cadet M.D.   On: 05/20/2018 15:13  Scheduled Meds: . amLODipine  10 mg Oral QHS  . aspirin  81 mg Oral Daily  . carvedilol  25 mg Oral BID WC  . Chlorhexidine Gluconate Cloth  6 each Topical Q0600  . Chlorhexidine Gluconate Cloth  6 each Topical Q0600  . darbepoetin (ARANESP) injection - DIALYSIS  150 mcg Intravenous Q Wed-HD  . heparin  5,000 Units Subcutaneous Q8H  . insulin aspart  0-9 Units Subcutaneous TID WC  . insulin glargine  26 Units Subcutaneous Daily  . lanthanum  1,000 mg Oral TID WC  . multivitamin  1 tablet Oral QHS   Continuous Infusions: . ceFEPime (MAXIPIME) IV    . ferric gluconate (FERRLECIT/NULECIT) IV 125 mg (05/21/18 1207)  . furosemide Stopped (05/21/18 0414)     LOS: 2 days    Time spent: 80min    Domenic Polite, MD [facility] Page via [email or web address], password [email or web address] After 7PM please contact night-coverage  05/21/2018, 2:28 PM

## 2018-05-22 ENCOUNTER — Encounter (HOSPITAL_COMMUNITY)
Admission: EM | Disposition: A | Discharge status: Home / Self Care | Payer: Self-pay | Source: Home / Self Care | Attending: Internal Medicine

## 2018-05-22 ENCOUNTER — Inpatient Hospital Stay (HOSPITAL_COMMUNITY): Payer: 59 | Admitting: Anesthesiology

## 2018-05-22 ENCOUNTER — Inpatient Hospital Stay (HOSPITAL_COMMUNITY): Payer: 59

## 2018-05-22 ENCOUNTER — Encounter (HOSPITAL_COMMUNITY): Payer: Self-pay | Admitting: *Deleted

## 2018-05-22 ENCOUNTER — Inpatient Hospital Stay (HOSPITAL_COMMUNITY): Admission: RE | Admit: 2018-05-22 | Payer: 59 | Source: Ambulatory Visit

## 2018-05-22 HISTORY — PX: AV FISTULA PLACEMENT: SHX1204

## 2018-05-22 HISTORY — PX: EXCHANGE OF A DIALYSIS CATHETER: SHX5818

## 2018-05-22 LAB — CBC
HCT: 27.9 % — ABNORMAL LOW (ref 39.0–52.0)
Hemoglobin: 9 g/dL — ABNORMAL LOW (ref 13.0–17.0)
MCH: 29.5 pg (ref 26.0–34.0)
MCHC: 32.3 g/dL (ref 30.0–36.0)
MCV: 91.5 fL (ref 78.0–100.0)
Platelets: 203 10*3/uL (ref 150–400)
RBC: 3.05 MIL/uL — ABNORMAL LOW (ref 4.22–5.81)
RDW: 12.2 % (ref 11.5–15.5)
WBC: 7.9 10*3/uL (ref 4.0–10.5)

## 2018-05-22 LAB — BASIC METABOLIC PANEL
Anion gap: 8 (ref 5–15)
BUN: 69 mg/dL — ABNORMAL HIGH (ref 6–20)
CO2: 29 mmol/L (ref 22–32)
Calcium: 6.9 mg/dL — ABNORMAL LOW (ref 8.9–10.3)
Chloride: 101 mmol/L (ref 101–111)
Creatinine, Ser: 5.86 mg/dL — ABNORMAL HIGH (ref 0.61–1.24)
GFR calc Af Amer: 12 mL/min — ABNORMAL LOW (ref >60)
GFR calc non Af Amer: 10 mL/min — ABNORMAL LOW (ref >60)
Glucose, Bld: 186 mg/dL — ABNORMAL HIGH (ref 65–99)
Potassium: 3.6 mmol/L (ref 3.5–5.1)
Sodium: 138 mmol/L (ref 135–145)

## 2018-05-22 LAB — GLUCOSE, CAPILLARY
Glucose-Capillary: 105 mg/dL — ABNORMAL HIGH (ref 65–99)
Glucose-Capillary: 122 mg/dL — ABNORMAL HIGH (ref 65–99)
Glucose-Capillary: 210 mg/dL — ABNORMAL HIGH (ref 65–99)
Glucose-Capillary: 217 mg/dL — ABNORMAL HIGH (ref 65–99)

## 2018-05-22 SURGERY — ARTERIOVENOUS (AV) FISTULA CREATION
Anesthesia: Monitor Anesthesia Care | Site: Neck | Laterality: Right

## 2018-05-22 MED ORDER — VANCOMYCIN HCL IN NACL 750-0.9 MG/150ML-% IV SOLN
750.0000 mg | INTRAVENOUS | Status: DC
  Filled 2018-05-22: qty 150

## 2018-05-22 MED ORDER — DARBEPOETIN ALFA 150 MCG/0.3ML IJ SOSY
PREFILLED_SYRINGE | INTRAMUSCULAR | Status: AC
  Filled 2018-05-22: qty 0.3

## 2018-05-22 MED ORDER — MEPERIDINE HCL 50 MG/ML IJ SOLN: 6.2500 mg | INTRAMUSCULAR | Status: DC | PRN

## 2018-05-22 MED ORDER — HEPARIN SODIUM (PORCINE) 1000 UNIT/ML IJ SOLN
INTRAMUSCULAR | Status: AC
  Filled 2018-05-22: qty 1

## 2018-05-22 MED ORDER — LIDOCAINE HCL (PF) 1 % IJ SOLN
INTRAMUSCULAR | Status: AC
  Filled 2018-05-22: qty 30

## 2018-05-22 MED ORDER — LIDOCAINE-EPINEPHRINE (PF) 1 %-1:200000 IJ SOLN
INTRAMUSCULAR | Status: AC
  Filled 2018-05-22: qty 30

## 2018-05-22 MED ORDER — MIDAZOLAM HCL 5 MG/5ML IJ SOLN
INTRAMUSCULAR | Status: DC | PRN
  Administered 2018-05-22: 2 mg via INTRAVENOUS

## 2018-05-22 MED ORDER — SODIUM CHLORIDE 0.9 % IV SOLN
INTRAVENOUS | Status: AC
  Filled 2018-05-22: qty 1.2

## 2018-05-22 MED ORDER — 0.9 % SODIUM CHLORIDE (POUR BTL) OPTIME
TOPICAL | Status: DC | PRN
  Administered 2018-05-22: 1000 mL

## 2018-05-22 MED ORDER — HYDROMORPHONE HCL 2 MG/ML IJ SOLN: 0.3000 mg | INTRAMUSCULAR | Status: DC | PRN

## 2018-05-22 MED ORDER — LIDOCAINE-EPINEPHRINE (PF) 1 %-1:200000 IJ SOLN
INTRAMUSCULAR | Status: DC | PRN
  Administered 2018-05-22: 20 mL

## 2018-05-22 MED ORDER — PROPOFOL 500 MG/50ML IV EMUL
INTRAVENOUS | Status: DC | PRN
  Administered 2018-05-22: 25 ug/kg/min via INTRAVENOUS

## 2018-05-22 MED ORDER — LIDOCAINE HCL (CARDIAC) PF 100 MG/5ML IV SOSY
PREFILLED_SYRINGE | INTRAVENOUS | Status: DC | PRN
  Administered 2018-05-22: 40 mg via INTRATRACHEAL

## 2018-05-22 MED ORDER — SODIUM CHLORIDE 0.9 % IV SOLN
INTRAVENOUS | Status: DC | PRN
  Administered 2018-05-22: 500 mL

## 2018-05-22 MED ORDER — FENTANYL CITRATE (PF) 250 MCG/5ML IJ SOLN
INTRAMUSCULAR | Status: AC
  Filled 2018-05-22: qty 5

## 2018-05-22 MED ORDER — SODIUM CHLORIDE 0.9 % IV SOLN
INTRAVENOUS | Status: DC
  Administered 2018-05-22: 10 mL/h via INTRAVENOUS
  Administered 2018-05-22: 14:00:00 via INTRAVENOUS

## 2018-05-22 MED ORDER — DARBEPOETIN ALFA 150 MCG/0.3ML IJ SOSY
150.0000 ug | PREFILLED_SYRINGE | INTRAMUSCULAR | Status: DC
  Administered 2018-05-22: 150 ug via INTRAVENOUS
  Filled 2018-05-22: qty 0.3

## 2018-05-22 MED ORDER — PROPOFOL 10 MG/ML IV BOLUS
INTRAVENOUS | Status: AC
  Filled 2018-05-22: qty 20

## 2018-05-22 MED ORDER — PROTAMINE SULFATE 10 MG/ML IV SOLN
INTRAVENOUS | Status: DC | PRN
  Administered 2018-05-22: 20 mg via INTRAVENOUS
  Administered 2018-05-22 (×2): 10 mg via INTRAVENOUS

## 2018-05-22 MED ORDER — HEPARIN SODIUM (PORCINE) 1000 UNIT/ML IJ SOLN
INTRAMUSCULAR | Status: DC | PRN
  Administered 2018-05-22: 7000 [IU] via INTRAVENOUS

## 2018-05-22 MED ORDER — FENTANYL CITRATE (PF) 250 MCG/5ML IJ SOLN
INTRAMUSCULAR | Status: DC | PRN
  Administered 2018-05-22: 50 ug via INTRAVENOUS
  Administered 2018-05-22: 25 ug via INTRAVENOUS

## 2018-05-22 MED ORDER — HEPARIN SODIUM (PORCINE) 1000 UNIT/ML IJ SOLN
INTRAMUSCULAR | Status: DC | PRN
  Administered 2018-05-22: 1000 [IU]

## 2018-05-22 MED ORDER — ONDANSETRON HCL 4 MG/2ML IJ SOLN: 4.0000 mg | Freq: Once | INTRAMUSCULAR | Status: DC | PRN

## 2018-05-22 MED ORDER — MIDAZOLAM HCL 2 MG/2ML IJ SOLN
INTRAMUSCULAR | Status: AC
  Filled 2018-05-22: qty 2

## 2018-05-22 MED ORDER — OXYCODONE-ACETAMINOPHEN 5-325 MG PO TABS: 1.0000 | ORAL_TABLET | ORAL | Status: DC | PRN

## 2018-05-22 MED ORDER — LIDOCAINE HCL (PF) 1 % IJ SOLN
INTRAMUSCULAR | Status: DC | PRN
  Administered 2018-05-22: 18 mL

## 2018-05-22 MED ORDER — VANCOMYCIN HCL 1000 MG IV SOLR
750.0000 mg | INTRAVENOUS | Status: DC
  Administered 2018-05-22: 750 mg via INTRAVENOUS
  Filled 2018-05-22: qty 750

## 2018-05-22 SURGICAL SUPPLY — 48 items
ARMBAND PINK RESTRICT EXTREMIT (MISCELLANEOUS) ×5 IMPLANT
BIOPATCH RED 1 DISK 7.0 (GAUZE/BANDAGES/DRESSINGS) ×1 IMPLANT
CANISTER SUCT 3000ML PPV (MISCELLANEOUS) ×3 IMPLANT
CANNULA VESSEL 3MM 2 BLNT TIP (CANNULA) ×3 IMPLANT
CATH PALINDROME RT-P 15FX23CM (CATHETERS) ×1 IMPLANT
CLIP VESOCCLUDE MED 6/CT (CLIP) ×3 IMPLANT
CLIP VESOCCLUDE SM WIDE 6/CT (CLIP) ×3 IMPLANT
COVER PROBE W GEL 5X96 (DRAPES) ×1 IMPLANT
COVER SURGICAL LIGHT HANDLE (MISCELLANEOUS) ×3 IMPLANT
DECANTER SPIKE VIAL GLASS SM (MISCELLANEOUS) ×3 IMPLANT
DERMABOND ADVANCED (GAUZE/BANDAGES/DRESSINGS) ×2
DERMABOND ADVANCED .7 DNX12 (GAUZE/BANDAGES/DRESSINGS) ×2 IMPLANT
DRAPE CHEST BREAST 15X10 FENES (DRAPES) ×1 IMPLANT
ELECT REM PT RETURN 9FT ADLT (ELECTROSURGICAL) ×3
ELECTRODE REM PT RTRN 9FT ADLT (ELECTROSURGICAL) ×2 IMPLANT
GLOVE BIO SURGEON STRL SZ 6.5 (GLOVE) ×1 IMPLANT
GLOVE BIO SURGEON STRL SZ7.5 (GLOVE) ×5 IMPLANT
GLOVE BIOGEL PI IND STRL 6.5 (GLOVE) IMPLANT
GLOVE BIOGEL PI IND STRL 7.0 (GLOVE) IMPLANT
GLOVE BIOGEL PI IND STRL 8 (GLOVE) ×2 IMPLANT
GLOVE BIOGEL PI INDICATOR 6.5 (GLOVE) ×3
GLOVE BIOGEL PI INDICATOR 7.0 (GLOVE) ×1
GLOVE BIOGEL PI INDICATOR 8 (GLOVE) ×2
GLOVE INDICATOR 7.0 STRL GRN (GLOVE) ×1 IMPLANT
GOWN STRL REUS W/ TWL LRG LVL3 (GOWN DISPOSABLE) ×6 IMPLANT
GOWN STRL REUS W/TWL LRG LVL3 (GOWN DISPOSABLE) ×5
KIT BASIN OR (CUSTOM PROCEDURE TRAY) ×3 IMPLANT
KIT TURNOVER KIT B (KITS) ×3 IMPLANT
NDL 18GX1X1/2 (RX/OR ONLY) (NEEDLE) IMPLANT
NDL HYPO 25GX1X1/2 BEV (NEEDLE) IMPLANT
NEEDLE 18GX1X1/2 (RX/OR ONLY) (NEEDLE) ×3 IMPLANT
NEEDLE HYPO 25GX1X1/2 BEV (NEEDLE) ×3 IMPLANT
NS IRRIG 1000ML POUR BTL (IV SOLUTION) ×3 IMPLANT
PACK CV ACCESS (CUSTOM PROCEDURE TRAY) ×3 IMPLANT
PAD ARMBOARD 7.5X6 YLW CONV (MISCELLANEOUS) ×6 IMPLANT
SPONGE SURGIFOAM ABS GEL 100 (HEMOSTASIS) IMPLANT
SUT ETHILON 3 0 PS 1 (SUTURE) ×1 IMPLANT
SUT PROLENE 6 0 BV (SUTURE) ×3 IMPLANT
SUT VIC AB 3-0 SH 27 (SUTURE) ×1
SUT VIC AB 3-0 SH 27X BRD (SUTURE) ×2 IMPLANT
SUT VIC AB 4-0 PS2 27 (SUTURE) ×1 IMPLANT
SUT VICRYL 4-0 PS2 18IN ABS (SUTURE) ×3 IMPLANT
SYR 10ML LL (SYRINGE) ×1 IMPLANT
SYR CONTROL 10ML LL (SYRINGE) ×1 IMPLANT
SYRINGE 12CC LL (MISCELLANEOUS) ×1 IMPLANT
TOWEL GREEN STERILE (TOWEL DISPOSABLE) ×3 IMPLANT
UNDERPAD 30X30 (UNDERPADS AND DIAPERS) ×3 IMPLANT
WATER STERILE IRR 1000ML POUR (IV SOLUTION) ×3 IMPLANT

## 2018-05-22 NOTE — Anesthesia Preprocedure Evaluation (Signed)
Anesthesia Evaluation  Patient identified by MRN, date of birth, ID band Patient awake    Reviewed: Allergy & Precautions, NPO status , Patient's Chart, lab work & pertinent test results  Airway Mallampati: I  TM Distance: >3 FB Neck ROM: Full    Dental   Pulmonary    Pulmonary exam normal        Cardiovascular hypertension, Pt. on medications Normal cardiovascular exam     Neuro/Psych    GI/Hepatic   Endo/Other  diabetes, Type 2, Oral Hypoglycemic Agents  Renal/GU ESRF and DialysisRenal disease     Musculoskeletal   Abdominal   Peds  Hematology   Anesthesia Other Findings   Reproductive/Obstetrics                             Anesthesia Physical Anesthesia Plan  ASA: III  Anesthesia Plan: MAC   Post-op Pain Management:    Induction: Intravenous  PONV Risk Score and Plan: 1 and Ondansetron and Treatment may vary due to age or medical condition  Airway Management Planned: Simple Face Mask  Additional Equipment:   Intra-op Plan:   Post-operative Plan:   Informed Consent: I have reviewed the patients History and Physical, chart, labs and discussed the procedure including the risks, benefits and alternatives for the proposed anesthesia with the patient or authorized representative who has indicated his/her understanding and acceptance.     Plan Discussed with: CRNA and Surgeon  Anesthesia Plan Comments:         Anesthesia Quick Evaluation

## 2018-05-22 NOTE — Progress Notes (Signed)
  Progress Note    05/22/2018 8:29 AM Day of Surgery  Subjective: No acute issues  Vitals:   05/22/18 0719 05/22/18 0730  BP: (!) 162/94 (!) 157/84  Pulse: 94 80  Resp: 17 (!) 24  Temp:    SpO2:      Physical Exam: Awake alert and oriented Nonlabored respirations Palpable radial pulses bilaterally   CBC    Component Value Date/Time   WBC 7.9 05/22/2018 0330   RBC 3.05 (L) 05/22/2018 0330   HGB 9.0 (L) 05/22/2018 0330   HCT 27.9 (L) 05/22/2018 0330   PLT 203 05/22/2018 0330   MCV 91.5 05/22/2018 0330   MCV 0.0 (A) 03/02/2018 1312   MCH 29.5 05/22/2018 0330   MCHC 32.3 05/22/2018 0330   RDW 12.2 05/22/2018 0330   LYMPHSABS 1.4 03/02/2018 1820   MONOABS 0.5 03/02/2018 1820   EOSABS 0.3 03/02/2018 1820   BASOSABS 0.0 03/02/2018 1820    BMET    Component Value Date/Time   NA 138 05/22/2018 0330   NA 132 (L) 01/13/2017 1200   K 3.6 05/22/2018 0330   CL 101 05/22/2018 0330   CO2 29 05/22/2018 0330   GLUCOSE 186 (H) 05/22/2018 0330   BUN 69 (H) 05/22/2018 0330   BUN 63 (H) 01/13/2017 1200   CREATININE 5.86 (H) 05/22/2018 0330   CREATININE 1.40 (H) 04/23/2012 1433   CALCIUM 6.9 (L) 05/22/2018 0330   CALCIUM 6.5 03/06/2018 0907   GFRNONAA 10 (L) 05/22/2018 0330   GFRNONAA 60 04/23/2012 1433   GFRAA 12 (L) 05/22/2018 0330   GFRAA 70 04/23/2012 1433    INR    Component Value Date/Time   INR 1.22 04/09/2012 0605     Intake/Output Summary (Last 24 hours) at 05/22/2018 0829 Last data filed at 05/22/2018 0616 Gross per 24 hour  Intake 642 ml  Output 2663 ml  Net -2021 ml     Assessment:  52 y.o. male is now with end-stage renal disease.  Plan: Tunneled dialysis catheter Placement left arm AV fistula We will need to move IV to right arm and removed from left. I discussed the risk benefits alternatives at this time patient does not want a fistula in his wrist given his work and would rather have a two-stage basilic vein fistula in his upper arm on the  left.   Morey Andonian C. Donzetta Matters, MD [facility] Office: [phone] Pager: [phone]  05/22/2018 8:29 AM

## 2018-05-22 NOTE — Care Management Note (Signed)
Case Management Note  Patient Details  Name: [name] [name] MRN: [id number] Date of Birth: 1966/02/11  Subjective/Objective:  From home with spouse, acute resp failure, new ESRD, had HD #2 yesterday via temp cath. For tunneled HD today and needs to be clipped.  On iv abx, iv lasix q6. Per Adela Lank with Renal , she is putting information in now where patient will be going for his dialysis.                   Action/Plan: New ESRD , needs to be clipped.   Expected Discharge Date:  05/24/18               Expected Discharge Plan:  Home/Self Care  In-House Referral:     Discharge planning Services  CM Consult  Post Acute Care Choice:    Choice offered to:     DME Arranged:    DME Agency:     HH Arranged:    HH Agency:     Status of Service:  In process, will continue to follow  If discussed at Long Length of Stay Meetings, dates discussed:    Additional Comments:  Zenon Mayo, RN 05/22/2018, 3:41 PM

## 2018-05-22 NOTE — Interval H&P Note (Signed)
History and Physical Interval Note:  05/22/2018 11:46 AM  [name] [name]  has presented today for surgery, with the diagnosis of end stage renal disease  The various methods of treatment have been discussed with the patient and family. After consideration of risks, benefits and other options for treatment, the patient has consented to  Procedure(s): ARTERIOVENOUS (AV) FISTULA CREATION VERSUS GRAFT ARM (Left) as a surgical intervention .  The patient's history has been reviewed, patient examined, no change in status, stable for surgery.  I have reviewed the patient's chart and labs.  Questions were answered to the patient's satisfaction.     Deitra Mayo

## 2018-05-22 NOTE — Progress Notes (Signed)
PROGRESS NOTE    [name] [name]  IRJ:[id number] DOB: 1966-12-13 DOA: 05/19/2018 PCP: Christain Sacramento, MD  Brief Narrative:this is a 52 year old male with history of type 2 diabetes, hypertension, progressive CKD 5 presented to the emergency room with cough, dyspnea, nausea and vomiting. -in the emergency room room noted to have pulmonary edema, BUN of 130, creatinine of 8.3, potassium and CO2 were within normal range. -had a CT chest which is concerning for pulmonary edema, pleural effusions and bilateral infiltrates  Assessment & Plan:   Pulmonary edema/Progressive CKD5 -Renal consulting, s/p urgent HD catheter 5/29 followed by dialysis-first Rx 5/29 -improving, getting daily HD now -CT chest: also notes areas concerning for infiltrate/pneumonia, on empiric antibiotic coverage for now however clinically less likely to be pneumonia -stop Vancomycin, repeat CXR reviewed, STop cefepime -per Renal -wean O2 -PT consult, RD consult  Acute hypoxic respiratory failure -Due to pulmonary edema as noted above, required nonrebreather mask yestedray -improving and with HD, still with high O2 needs -repeat CXR with improving edema -wean O2  CKD 5/New ESRD -as above  Acute on chronic diastolic CHF -volume overloaded state due to CKD 5 -as above  Type 2 diabetes mellitus -Last hemoglobin A1c was 6.3 in April -Continue Tresiba will decrease dose, sliding scale insulin  Anemia due to chronic kidney disease -epo with HD  DVT prophylaxis:heparin subcutaneous Code Status: for code Family Communication:friend at bedside Disposition Plan: home when pulm edema improved and clip complete  Consultants:   Nephrology  Interventional radiology   Procedures:   Antimicrobials:    Subjective: -breathing improving, + nausea  Objective: Vitals:   05/22/18 0900 05/22/18 0930 05/22/18 1000 05/22/18 1030  BP: (!) 148/97 (!) 148/84 (!) 150/90 (!) 149/85  Pulse: 78 77 76 79  Resp:  (!) 22 (!) 24 20 (!) 24  Temp:      TempSrc:      SpO2: 100%   98%  Weight:      Height:        Intake/Output Summary (Last 24 hours) at 05/22/2018 1128 Last data filed at 05/22/2018 3016 Gross per 24 hour  Intake 642 ml  Output 2663 ml  Net -2021 ml   Filed Weights   05/21/18 0915 05/21/18 1226 05/22/18 0710  Weight: 86.6 kg (190 lb 14.7 oz) 84.7 kg (186 lb 11.7 oz) 85.2 kg (187 lb 13.3 oz)    Examination:  Gen: Awake, Alert, Oriented X 3, no distress HEENT: PERRLA, Neck supple, + JVD Lungs: fine basilar crackles CVS: RRR,No Gallops,Rubs or new Murmurs Abd: soft, Non tender, non distended, BS present Extremities: 1plus  edema Skin: no new rashes Psychiatry: Judgement and insight appear normal. Mood & affect appropriate.     Data Reviewed:   CBC: Recent Labs  Lab 05/19/18 1320 05/20/18 0415 05/20/18 1950 05/21/18 1011 05/22/18 0330  WBC 8.3 10.1 10.8* 9.4 7.9  HGB 9.5* 10.8* 10.1* 9.4* 9.0*  HCT 29.3* 33.3* 30.7* 28.5* 27.9*  MCV 91.3 91.7 89.8 91.6 91.5  PLT 207 234 234 204 010   Basic Metabolic Panel: Recent Labs  Lab 05/19/18 1320 05/19/18 1728 05/20/18 0415 05/20/18 1950 05/21/18 1012 05/22/18 0330  NA 139  --  139 138 140 138  K 4.0  --  4.0 3.4* 3.4* 3.6  CL 100*  --  98* 98* 100* 101  CO2 23  --  24 27 28 29   GLUCOSE 216*  --  263* 87 152* 186*  BUN 132*  --  130* 86*  90* 69*  CREATININE 8.53*  --  8.36* 6.22* 6.75* 5.86*  CALCIUM 6.2*  --  6.8* 7.3* 7.0* 6.9*  MG  --  2.5*  --   --   --   --   PHOS  --  8.0* 8.4* 5.9* 6.6*  --    GFR: Estimated Creatinine Clearance: 17.3 mL/min (A) (by C-G formula based on SCr of 5.86 mg/dL (H)). Liver Function Tests: Recent Labs  Lab 05/20/18 0415 05/20/18 1950 05/21/18 1012  ALBUMIN 3.0* 2.9* 2.4*   Recent Labs  Lab 05/20/18 0057  LIPASE 32   No results for input(s): AMMONIA in the last 168 hours. Coagulation Profile: No results for input(s): INR, PROTIME in the last 168 hours. Cardiac  Enzymes: Recent Labs  Lab 05/19/18 2343 05/20/18 0415 05/20/18 1046  TROPONINI 0.04* 0.04* 0.05*   BNP (last 3 results) No results for input(s): PROBNP in the last 8760 hours. HbA1C: Recent Labs    05/20/18 0415  HGBA1C 7.2*   CBG: Recent Labs  Lab 05/20/18 2148 05/21/18 0815 05/21/18 1405 05/21/18 1646 05/21/18 2138  GLUCAP 175* 147* 132* 176* 223*   Lipid Profile: Recent Labs    05/20/18 0415  CHOL 134  HDL 40*  LDLCALC 76  TRIG 88  CHOLHDL 3.4   Thyroid Function Tests: No results for input(s): TSH, T4TOTAL, FREET4, T3FREE, THYROIDAB in the last 72 hours. Anemia Panel: Recent Labs    05/20/18 0415  TIBC 227*  IRON 18*   Urine analysis:    Component Value Date/Time   COLORURINE YELLOW 05/20/2018 0711   APPEARANCEUR HAZY (A) 05/20/2018 0711   LABSPEC 1.015 05/20/2018 0711   PHURINE 5.5 05/20/2018 0711   GLUCOSEU NEGATIVE 05/20/2018 0711   HGBUR SMALL (A) 05/20/2018 0711   BILIRUBINUR NEGATIVE 05/20/2018 0711   BILIRUBINUR negative 04/20/2018 1452   KETONESUR NEGATIVE 05/20/2018 0711   PROTEINUR 100 (A) 05/20/2018 0711   UROBILINOGEN 0.2 04/20/2018 1452   UROBILINOGEN 0.2 04/09/2012 2229   NITRITE NEGATIVE 05/20/2018 0711   LEUKOCYTESUR NEGATIVE 05/20/2018 0711   Sepsis Labs: @LABRCNTIP (procalcitonin:4,lacticidven:4)  ) Recent Results (from the past 240 hour(s))  Blood culture (routine x 2)     Status: None (Preliminary result)   Collection Time: 05/19/18  9:14 PM  Result Value Ref Range Status   Specimen Description BLOOD LEFT ANTECUBITAL  Final   Special Requests   Final    BOTTLES DRAWN AEROBIC AND ANAEROBIC Blood Culture adequate volume   Culture   Final    NO GROWTH 3 DAYS Performed at [facility], [place] [address] [place], [place] [address]    Report Status PENDING  Incomplete  Blood culture (routine x 2)     Status: None (Preliminary result)   Collection Time: 05/19/18  9:31 PM  Result Value Ref Range Status   Specimen  Description BLOOD RIGHT HAND  Final   Special Requests   Final    BOTTLES DRAWN AEROBIC AND ANAEROBIC Blood Culture adequate volume   Culture   Final    NO GROWTH 3 DAYS Performed at [facility], [place] [address], [place], [place] [address]    Report Status PENDING  Incomplete  MRSA PCR Screening     Status: None   Collection Time: 05/20/18  6:28 PM  Result Value Ref Range Status   MRSA by PCR NEGATIVE NEGATIVE Final    Comment:        The GeneXpert MRSA Assay (FDA approved for NASAL specimens only), is  one component of a comprehensive MRSA colonization surveillance program. It is not intended to diagnose MRSA infection nor to guide or monitor treatment for MRSA infections. Performed at [facility], [place] [address] [place], [place] [address]          Radiology Studies: Ct Abdomen Pelvis Wo Contrast  Result Date: 05/20/2018 CLINICAL DATA:  Nausea and vomiting.  Abdominal pain. EXAM: CT ABDOMEN AND PELVIS WITHOUT CONTRAST TECHNIQUE: Multidetector CT imaging of the abdomen and pelvis was performed following the standard protocol without IV contrast. COMPARISON:  CT scan of the abdomen dated 04/04/2012 and CT scan of the chest dated 05/19/2018 FINDINGS: Lower chest: Persistent moderate bilateral pleural effusions. Slight progression of bilateral multifocal pulmonary infiltrates. Radiodense material at both lung bases as previously described. There is no pleural calcification at the right lung base posterior medially. Small pericardial effusion, unchanged. Hepatobiliary: Liver parenchyma appears normal. Subtle inhomogeneous density in the gallbladder could represent stones. Gallbladder wall is not thickened. No dilated bile ducts. Pancreas: Unremarkable. No pancreatic ductal dilatation or surrounding inflammatory changes. Spleen: Normal in size without focal abnormality. Adrenals/Urinary Tract: Tiny calcification in the upper pole of the otherwise normal left kidney. Right  kidney is normal. Adrenal glands and ureters are normal. Bladder appears normal. Stomach/Bowel: Stomach is within normal limits. Appendix appears normal. No evidence of bowel wall thickening, distention, or inflammatory changes. Vascular/Lymphatic: Aortic atherosclerosis. No enlarged abdominal or pelvic lymph nodes. Reproductive: Prostate is unremarkable. Other: No abdominal wall hernia or abnormality. No abdominopelvic ascites. Musculoskeletal: No acute or significant osseous findings. IMPRESSION: No acute abnormality of the abdomen or pelvis. Possible stones in the gallbladder. Aortic atherosclerosis. Bilateral pleural effusions with bilateral multifocal pulmonary infiltrates. Radiodense material in the lungs could represent aspirated barium. Pleural calcifications in the right hemithorax. Electronically Signed   By: Lorriane Shire M.D.   On: 05/20/2018 13:07   Ir Fluoro Guide Cv Line Right  Result Date: 05/20/2018 INDICATION: 52 year old male with end-stage renal disease now in need of hemodialysis. He presents for urgent Trialysis catheter placement. EXAM: IR ULTRASOUND GUIDANCE VASC ACCESS RIGHT; IR RIGHT FLOURO GUIDE CV LINE MEDICATIONS: None. ANESTHESIA/SEDATION: None. FLUOROSCOPY TIME:  Fluoroscopy Time: 0 minutes 6 seconds (1 mGy). COMPLICATIONS: None immediate. PROCEDURE: Informed written consent was obtained from the patient after a thorough discussion of the procedural risks, benefits and alternatives. All questions were addressed. Maximal Sterile Barrier Technique was utilized including caps, mask, sterile gowns, sterile gloves, sterile drape, hand hygiene and skin antiseptic. A timeout was performed prior to the initiation of the procedure. The right internal jugular vein was interrogated with ultrasound and found to be widely patent. An image was obtained and stored for the medical record. Local anesthesia was attained by infiltration with 1% lidocaine. A small dermatotomy was made. Under  real-time sonographic guidance, the vessel was punctured with a 19 gauge needle. Using standard technique, the needle was exchanged over a 0.035 wire for a dilator and the soft tissue tract was dilated. A 20 cm Trialysis catheter was then advanced over the wire and positioned with the tip in the mid right atrium. The catheter flushed and aspirated with ease. All 3 lumens were flushed and locked. The catheter was secured to the skin with 0 Prolene suture. A sterile bandage was applied. The patient tolerated the procedure well. IMPRESSION: Successful placement of a right IJ approach Trialysis catheter. The catheter tips are in the mid right atrium and ready for immediate use. Electronically Signed   By: Myrle Sheng  Laurence Ferrari M.D.   On: 05/20/2018 15:13   Ir US Guide Vasc Access Right  Result Date: 05/20/2018 INDICATION: 52 year old male with end-stage renal disease now in need of hemodialysis. He presents for urgent Trialysis catheter placement. EXAM: IR ULTRASOUND GUIDANCE VASC ACCESS RIGHT; IR RIGHT FLOURO GUIDE CV LINE MEDICATIONS: None. ANESTHESIA/SEDATION: None. FLUOROSCOPY TIME:  Fluoroscopy Time: 0 minutes 6 seconds (1 mGy). COMPLICATIONS: None immediate. PROCEDURE: Informed written consent was obtained from the patient after a thorough discussion of the procedural risks, benefits and alternatives. All questions were addressed. Maximal Sterile Barrier Technique was utilized including caps, mask, sterile gowns, sterile gloves, sterile drape, hand hygiene and skin antiseptic. A timeout was performed prior to the initiation of the procedure. The right internal jugular vein was interrogated with ultrasound and found to be widely patent. An image was obtained and stored for the medical record. Local anesthesia was attained by infiltration with 1% lidocaine. A small dermatotomy was made. Under real-time sonographic guidance, the vessel was punctured with a 19 gauge needle. Using standard technique, the needle was  exchanged over a 0.035 wire for a dilator and the soft tissue tract was dilated. A 20 cm Trialysis catheter was then advanced over the wire and positioned with the tip in the mid right atrium. The catheter flushed and aspirated with ease. All 3 lumens were flushed and locked. The catheter was secured to the skin with 0 Prolene suture. A sterile bandage was applied. The patient tolerated the procedure well. IMPRESSION: Successful placement of a right IJ approach Trialysis catheter. The catheter tips are in the mid right atrium and ready for immediate use. Electronically Signed   By: Jacqulynn Cadet M.D.   On: 05/20/2018 15:13   Dg Chest Port 1 View  Result Date: 05/22/2018 CLINICAL DATA:  Hypoxia. EXAM: PORTABLE CHEST 1 VIEW COMPARISON:  05/19/2018 FINDINGS: Diminishing pulmonary edema. Some edema does persist. Small amount of pleural fluid bilaterally. Central line tips in the proximal right atrium. No pneumothorax. IMPRESSION: Radiographic improvement with diminishing edema. Electronically Signed   By: Nelson Chimes M.D.   On: 05/22/2018 08:55        Scheduled Meds: . amLODipine  10 mg Oral QHS  . aspirin  81 mg Oral Daily  . carvedilol  25 mg Oral BID WC  . Chlorhexidine Gluconate Cloth  6 each Topical Q0600  . Chlorhexidine Gluconate Cloth  6 each Topical Q0600  . Darbepoetin Alfa      . darbepoetin (ARANESP) injection - DIALYSIS  150 mcg Intravenous Q Fri-HD  . heparin  5,000 Units Subcutaneous Q8H  . insulin aspart  0-9 Units Subcutaneous TID WC  . insulin glargine  26 Units Subcutaneous Daily  . lanthanum  1,000 mg Oral TID WC  . multivitamin  1 tablet Oral QHS   Continuous Infusions: . ceFEPime (MAXIPIME) IV Stopped (05/21/18 2133)  . ferric gluconate (FERRLECIT/NULECIT) IV 125 mg (05/21/18 1207)  . furosemide Stopped (05/22/18 0716)     LOS: 3 days    Time spent: 3min    Domenic Polite, MD [facility] Page via [email or web address], password [email or web address] After 7PM please  contact night-coverage  05/22/2018, 11:28 AM

## 2018-05-22 NOTE — H&P (View-Only) (Signed)
  Progress Note    05/22/2018 8:29 AM Day of Surgery  Subjective: No acute issues  Vitals:   05/22/18 0719 05/22/18 0730  BP: (!) 162/94 (!) 157/84  Pulse: 94 80  Resp: 17 (!) 24  Temp:    SpO2:      Physical Exam: Awake alert and oriented Nonlabored respirations Palpable radial pulses bilaterally   CBC    Component Value Date/Time   WBC 7.9 05/22/2018 0330   RBC 3.05 (L) 05/22/2018 0330   HGB 9.0 (L) 05/22/2018 0330   HCT 27.9 (L) 05/22/2018 0330   PLT 203 05/22/2018 0330   MCV 91.5 05/22/2018 0330   MCV 0.0 (A) 03/02/2018 1312   MCH 29.5 05/22/2018 0330   MCHC 32.3 05/22/2018 0330   RDW 12.2 05/22/2018 0330   LYMPHSABS 1.4 03/02/2018 1820   MONOABS 0.5 03/02/2018 1820   EOSABS 0.3 03/02/2018 1820   BASOSABS 0.0 03/02/2018 1820    BMET    Component Value Date/Time   NA 138 05/22/2018 0330   NA 132 (L) 01/13/2017 1200   K 3.6 05/22/2018 0330   CL 101 05/22/2018 0330   CO2 29 05/22/2018 0330   GLUCOSE 186 (H) 05/22/2018 0330   BUN 69 (H) 05/22/2018 0330   BUN 63 (H) 01/13/2017 1200   CREATININE 5.86 (H) 05/22/2018 0330   CREATININE 1.40 (H) 04/23/2012 1433   CALCIUM 6.9 (L) 05/22/2018 0330   CALCIUM 6.5 03/06/2018 0907   GFRNONAA 10 (L) 05/22/2018 0330   GFRNONAA 60 04/23/2012 1433   GFRAA 12 (L) 05/22/2018 0330   GFRAA 70 04/23/2012 1433    INR    Component Value Date/Time   INR 1.22 04/09/2012 0605     Intake/Output Summary (Last 24 hours) at 05/22/2018 0829 Last data filed at 05/22/2018 0616 Gross per 24 hour  Intake 642 ml  Output 2663 ml  Net -2021 ml     Assessment:  52 y.o. male is now with end-stage renal disease.  Plan: Tunneled dialysis catheter Placement left arm AV fistula We will need to move IV to right arm and removed from left. I discussed the risk benefits alternatives at this time patient does not want a fistula in his wrist given his work and would rather have a two-stage basilic vein fistula in his upper arm on the  left.   Brandon C. Donzetta Matters, MD [facility] Office: [phone] Pager: [phone]  05/22/2018 8:29 AM

## 2018-05-22 NOTE — Procedures (Signed)
I was present at this dialysis session. I have reviewed the session itself and made appropriate changes.   Filed Weights   05/21/18 0915 05/21/18 1226 05/22/18 0710  Weight: 86.6 kg (190 lb 14.7 oz) 84.7 kg (186 lb 11.7 oz) 85.2 kg (187 lb 13.3 oz)    Recent Labs  Lab 05/21/18 1012 05/22/18 0330  NA 140 138  K 3.4* 3.6  CL 100* 101  CO2 28 29  GLUCOSE 152* 186*  BUN 90* 69*  CREATININE 6.75* 5.86*  CALCIUM 7.0* 6.9*  PHOS 6.6*  --     Recent Labs  Lab 05/20/18 1950 05/21/18 1011 05/22/18 0330  WBC 10.8* 9.4 7.9  HGB 10.1* 9.4* 9.0*  HCT 30.7* 28.5* 27.9*  MCV 89.8 91.6 91.5  PLT 234 204 203    Scheduled Meds: . amLODipine  10 mg Oral QHS  . aspirin  81 mg Oral Daily  . carvedilol  25 mg Oral BID WC  . Chlorhexidine Gluconate Cloth  6 each Topical Q0600  . Chlorhexidine Gluconate Cloth  6 each Topical Q0600  . darbepoetin (ARANESP) injection - DIALYSIS  150 mcg Intravenous Q Wed-HD  . heparin  5,000 Units Subcutaneous Q8H  . insulin aspart  0-9 Units Subcutaneous TID WC  . insulin glargine  26 Units Subcutaneous Daily  . lanthanum  1,000 mg Oral TID WC  . multivitamin  1 tablet Oral QHS   Continuous Infusions: . ceFEPime (MAXIPIME) IV Stopped (05/21/18 2133)  . ferric gluconate (FERRLECIT/NULECIT) IV 125 mg (05/21/18 1207)  . furosemide 160 mg (05/22/18 0616)   PRN Meds:.acetaminophen, albuterol, dextromethorphan-guaiFENesin, hydrALAZINE, hydrOXYzine, hydrOXYzine, nitroGLYCERIN, zolpidem    Background 52 yr old male with long hx DM, HTN , and progressive renal dz, stage 5. Had refused to prepare, get access, or PD cath ahead of time (was his preferred stated modality . Progressive SOB at rest, N, V, cramping, and found to have Cr of 8.5/BUN 132. New ESRD.   Assessment/Recommendations   1. CKD 5 with progressive vol xs, uremic. New ESRD 1. Had HD#2 yesterday via temp cath placed by IR and currently receiving 3rd session 2. To have tunneled HD cath/AVF/AVG  today by Dr. Donzetta Matters  3. Consult to [facility] for AVF/AVG and to tunnel catheter ([facility] contacted) 4. CLIP (probably will CLIP to [place]) 5. Has passed window for elective PD, but will consider in future. 2.  DM controlled 3.  Hypertension:lower vol , give meds 4.  Anemia of ESRD:TSat low. Fe load. 8 daily doses ordered (to avoid missing doses w/irreg HD at this time). For Aranesp with HD today. 5.  Metabolic Bone Disease:PTH PENDING. Lanthanum with meals started 6.  NONADHERENCE ^ risk or complic  Donetta Potts,  MD 05/22/2018, 8:04 AM

## 2018-05-22 NOTE — Progress Notes (Signed)
Nutrition Consult/Brief Note  RD consulted for renal diet education. Pt is new ESRD. Spoke with RN. He just returned from OR for AV fistula creation.  Not appropriate for education at this time. Groggy post-op. Will return at later date.  Arthur Holms, RD, LDN Pager #: [phone] After-Hours Pager #: [phone]

## 2018-05-22 NOTE — Anesthesia Procedure Notes (Signed)
Procedure Name: MAC Date/Time: 05/22/2018 12:30 PM Performed by: Mariea Clonts, CRNA Pre-anesthesia Checklist: Patient identified, Emergency Drugs available, Patient being monitored, Timeout performed and Suction available Oxygen Delivery Method: Nasal cannula and Simple face mask

## 2018-05-22 NOTE — Anesthesia Postprocedure Evaluation (Signed)
Anesthesia Post Note  Patient: [name] [name]  Procedure(s) Performed: Government social research officer Fistula Left Arm (Left Arm Upper) EXCHANGE OF A DIALYSIS CATHETER (Right Neck)     Patient location during evaluation: PACU Anesthesia Type: MAC Level of consciousness: awake and alert Pain management: pain level controlled Vital Signs Assessment: post-procedure vital signs reviewed and stable Respiratory status: spontaneous breathing, nonlabored ventilation, respiratory function stable and patient connected to nasal cannula oxygen Cardiovascular status: stable and blood pressure returned to baseline Postop Assessment: no apparent nausea or vomiting Anesthetic complications: no    Last Vitals:  Vitals:   05/22/18 1435 05/22/18 1446  BP: 115/71 124/75  Pulse: 75 73  Resp: 19 15  Temp:  36.6 C  SpO2: 100% 100%    Last Pain:  Vitals:   05/22/18 0710  TempSrc: Oral  PainSc:                  Graham Hyun DAVID

## 2018-05-22 NOTE — Transfer of Care (Signed)
Immediate Anesthesia Transfer of Care Note  Patient: [name] [name]  Procedure(s) Performed: Creation of BrachiaCephalic Fistula Left Arm (Left Arm Upper) EXCHANGE OF A DIALYSIS CATHETER (Right Neck)  Patient Location: PACU  Anesthesia Type:MAC  Level of Consciousness: awake, alert  and oriented  Airway & Oxygen Therapy: Patient Spontanous Breathing and Patient connected to nasal cannula oxygen  Post-op Assessment: Report given to RN, Post -op Vital signs reviewed and stable and Patient moving all extremities X 4  Post vital signs: Reviewed and stable  Last Vitals:  Vitals Value Taken Time  BP 127/75 05/22/2018  2:20 PM  Temp 37.5 C 05/22/2018  2:20 PM  Pulse 75 05/22/2018  2:23 PM  Resp 21 05/22/2018  2:23 PM  SpO2 100 % 05/22/2018  2:23 PM  Vitals shown include unvalidated device data.  Last Pain:  Vitals:   05/22/18 0710  TempSrc: Oral  PainSc:       Patients Stated Pain Goal: 0 (38/87/19 5974)  Complications: No apparent anesthesia complications

## 2018-05-22 NOTE — Op Note (Signed)
    NAME: [name] [name]    MRN: [id number] DOB: [birth year]    DATE OF OPERATION: 05/22/2018  PREOP DIAGNOSIS:    Stage V chronic kidney disease  POSTOP DIAGNOSIS:    Same  PROCEDURE:    1.  Placement of right IJ 23 cm tunneled dialysis catheter 2.  Left brachiocephalic AV fistula  SURGEON: Judeth Cornfield. Scot Dock, MD, FACS  ASSIST: Laurence Slate, PA  ANESTHESIA: Local with sedation  EBL: Minimal  INDICATIONS:    [name] [name] is a 52 y.o. male who presents for new access.  He has a temporary catheter in his right IJ.  FINDINGS:   The upper arm cephalic vein was 4.0 mm in diameter.  At the completion he had a good thrill in his fistula and a palpable radial pulse.  TECHNIQUE:   The patient was taken to the operating room and sedated by anesthesia.  The neck and upper chest were prepped and draped in usual sterile fashion.  The previous catheter was clamped after the skin was anesthetized.  The catheter was divided.  A guidewire was advanced through the catheter into the right atrium.  The old catheter was removed.  The dilator and peel-away sheath were advanced over the wire and the wire and dilator removed.  The 23 cm catheter was advanced through the peel-away sheath and positioned in the right atrium.  Peel-away sheath was removed.  The exit site for the catheter was selected and the skin anesthetized between the 2 areas.  The catheter was brought through the tunnel cut to the appropriate length and the distal ports were attached.  Both ports withdrew easily with and flushed with heparinized saline and filled with concentrated heparin.  Catheter was secured at its exit site with a 3-0 nylon suture.  The IJ cannulation site was closed with 4-0 subcuticular stitch.  Attention was then turned to the left arm.  The left arm was prepped and draped in usual sterile fashion.  I looked at the upper arm cephalic vein with the SonoSite and I felt this was reasonable in size.   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  After the skin was anesthetized, a transverse incision was made just above the antecubital level.  Here the cephalic vein was dissected free and ligated distally.  It irrigated up nicely with heparinized saline was about 4 mm vein.  The brachial artery was dissected free beneath the fascia.  The patient was heparinized.  The brachial artery was clamped proximally and distally and a longitudinal arteriotomy was made.  The vein was sewn into side to the artery using continuous 6-0 Prolene suture.  At the completion there was a good thrill in the fistula and a palpable radial pulse.  Hemostasis was obtained in the wound.  The heparin was partially reversed with protamine.  The wound was closed with a deep layer of 3-0 Vicryl.  The skin was closed with 4-0 Vicryl.  Dermabond was applied.  The patient tolerated the procedure well was transferred to the recovery room in stable condition.  All needle and sponge counts were correct.  Deitra Mayo, MD, FACS [facility]  DATE OF DICTATION:   05/22/2018

## 2018-05-22 NOTE — Progress Notes (Signed)
Accepted at [facility] .1st treatment Tuesday 04,2019 at 11:45am .Schedule and chairtime Tuesday,Thursday,Saturday at 12:30pm 2nd shift

## 2018-05-23 ENCOUNTER — Encounter (HOSPITAL_COMMUNITY): Payer: Self-pay | Admitting: Vascular Surgery

## 2018-05-23 ENCOUNTER — Inpatient Hospital Stay (HOSPITAL_COMMUNITY): Payer: 59

## 2018-05-23 DIAGNOSIS — I509 Heart failure, unspecified: Secondary | ICD-10-CM

## 2018-05-23 LAB — RESPIRATORY PANEL BY PCR

## 2018-05-23 LAB — GLUCOSE, CAPILLARY
Glucose-Capillary: 268 mg/dL — ABNORMAL HIGH (ref 65–99)
Glucose-Capillary: 265 mg/dL — ABNORMAL HIGH (ref 65–99)
Glucose-Capillary: 176 mg/dL — ABNORMAL HIGH (ref 65–99)
Glucose-Capillary: 241 mg/dL — ABNORMAL HIGH (ref 65–99)

## 2018-05-23 LAB — ECHOCARDIOGRAM COMPLETE
Height: 74 in
Weight: 3005.31 oz

## 2018-05-23 MED ORDER — SODIUM CHLORIDE 0.9 % IV SOLN
125.0000 mg | Freq: Every day | INTRAVENOUS | Status: DC
  Administered 2018-05-24: 125 mg via INTRAVENOUS
  Filled 2018-05-23 (×4): qty 10

## 2018-05-23 MED ORDER — INSULIN GLARGINE 100 UNIT/ML ~~LOC~~ SOLN
35.0000 [IU] | Freq: Every day | SUBCUTANEOUS | Status: DC
  Administered 2018-05-24: 35 [IU] via SUBCUTANEOUS
  Filled 2018-05-23: qty 0.35

## 2018-05-23 NOTE — Progress Notes (Signed)
  Echocardiogram 2D Echocardiogram has been performed.  [name] [name] [name] 05/23/2018, 9:33 AM

## 2018-05-23 NOTE — Progress Notes (Signed)
Provider on call was notified patient had 5 beats of vtach.  The patient was asymptomatic.

## 2018-05-23 NOTE — Progress Notes (Signed)
   VASCULAR SURGERY ASSESSMENT & PLAN:   1 Day Post-Op s/p: Placement of tunneled dialysis catheter and left brachiocephalic AV fistula  His fistula has a good thrill and he has a palpable left radial pulse.  I have arranged follow-up in 6 weeks.  Vascular surgery will be available as needed.  SUBJECTIVE:   Pain adequately controlled.  PHYSICAL EXAM:   Vitals:   05/22/18 2128 05/23/18 0100 05/23/18 0225 05/23/18 0531  BP: 131/77  129/68 129/72  Pulse: 80   78  Resp: (!) 21 (!) 24 16 17   Temp: 98.9 F (37.2 C)   98.6 F (37 C)  TempSrc: Oral   Oral  SpO2: 97% 93% 95% 96%  Weight:      Height:       Good thrill in left upper arm fistula. Palpable left radial pulse.  LABS:   Lab Results  Component Value Date   WBC 7.9 05/22/2018   HGB 9.0 (L) 05/22/2018   HCT 27.9 (L) 05/22/2018   MCV 91.5 05/22/2018   PLT 203 05/22/2018   Lab Results  Component Value Date   CREATININE 5.86 (H) 05/22/2018   Lab Results  Component Value Date   INR 1.22 04/09/2012   CBG (last 3)  Recent Labs    05/22/18 1425 05/22/18 1741 05/22/18 2127  GLUCAP 122* 210* 217*    PROBLEM LIST:    Principal Problem:   Acute respiratory failure with hypoxia (HCC) Active Problems:   Essential hypertension, benign   CKD (chronic kidney disease), stage V (HCC)   Acute on chronic diastolic CHF (congestive heart failure) (HCC)   Type II diabetes mellitus with renal manifestations (HCC)   Anemia   Hypocalcemia   HCAP (healthcare-associated pneumonia)   Nausea & vomiting   CURRENT MEDS:   . amLODipine  10 mg Oral QHS  . aspirin  81 mg Oral Daily  . carvedilol  25 mg Oral BID WC  . Chlorhexidine Gluconate Cloth  6 each Topical Q0600  . darbepoetin (ARANESP) injection - DIALYSIS  150 mcg Intravenous Q Fri-HD  . heparin  5,000 Units Subcutaneous Q8H  . insulin aspart  0-9 Units Subcutaneous TID WC  . insulin glargine  26 Units Subcutaneous Daily  . lanthanum  1,000 mg Oral TID WC  .  multivitamin  1 tablet Oral QHS    [name] [name] Beeper: [phone] Office: [phone] 05/23/2018

## 2018-05-23 NOTE — Progress Notes (Signed)
Nutrition Education Note  RD consulted for Renal Education. Provided Choose-A-Meal Booklet and handout outlining suggested meals for patients with DM on HD to patient. Patient was out of the room at this time and no family/visitors were present. Handouts were left on bedside table.  Unable to assess compliance at this time.  Pt with hx of Type 2 MD, HTN, progressive CKD stage 5. Emergent HD catheter was placed on 5/29 with HD done that day and he had AV fistula placed yesterday.   Body mass index is 24.12 kg/m. Pt meets criteria for normal weight based on current BMI.  Current diet order is Renal/Carb Modified with 1.2L fluid restriction, patient is consuming approximately 100% of meals at this time.  Medications reviewed; 125 mg Nulecit daily x6 days, sliding scale Novolog, 35 units Lantus/day, 1000 mg Fosrenol TID with meals, 1 tablet Rena-vit/day.  Labs reviewed; CBGs: 268 and 265 mg/dL today, BUN: 69 mg/dL, creatinine: 5.86 mg/dL, Ca: 6.9 mg/dL, GFR: 10 mL/min.   No further nutrition interventions warranted at this time. RD contact information provided. If additional nutrition issues arise, please re-consult RD.     Jarome Matin, MS, RD, LDN, CNSC Inpatient Clinical Dietitian Pager # [phone] After hours/weekend pager # [phone]

## 2018-05-23 NOTE — Progress Notes (Signed)
[facility] ROUNDING NOTE   Subjective:   Brief history : 52 yr old male with long hx DM, HTN , and progressive renal dz, stage 5. Hadrefused to prepare, get access, or PD cath ahead of time (was his preferred stated modality. Progressive SOB at rest, N, V, cramping, and found to have Cr of 8.5/BUN 132.New ESRD.   No complaints today except from soreness in left arm after access placement. He is curious about stopping his lasix that was started on admission. He is hoping to leave the room today to ambulate to the sunroom.       Objective:  Vital signs in last 24 hours:  Temp:  [97.9 F (36.6 C)-99.5 F (37.5 C)] 98.2 F (36.8 C) (06/01 0830) Pulse Rate:  [73-82] 79 (06/01 0830) Resp:  [15-24] 23 (06/01 0830) BP: (115-161)/(68-96) 130/73 (06/01 0830) SpO2:  [93 %-100 %] 98 % (06/01 0830) Weight:  [187 lb 13.3 oz (85.2 kg)] 187 lb 13.3 oz (85.2 kg) (05/31 1200)  Weight change: -3 lb 1.4 oz (-1.4 kg) Filed Weights   05/21/18 1226 05/22/18 0710 05/22/18 1200  Weight: 186 lb 11.7 oz (84.7 kg) 187 lb 13.3 oz (85.2 kg) 187 lb 13.3 oz (85.2 kg)    Intake/Output: I/O last 3 completed shifts: In: 9678 [P.O.:390; I.V.:500; Other:25; IV LFYBOFBPZ:025] Out: 2428 [Urine:1275; ENIDP:8242; Blood:20]   Intake/Output this shift:  Total I/O In: 240 [P.O.:240] Out: -   CVS- RRR RS- CTA ABD- BS present soft non-distended EXT- no edema   Basic Metabolic Panel: Recent Labs  Lab 05/19/18 1320 05/19/18 1728 05/20/18 0415 05/20/18 1950 05/21/18 1012 05/22/18 0330  NA 139  --  139 138 140 138  K 4.0  --  4.0 3.4* 3.4* 3.6  CL 100*  --  98* 98* 100* 101  CO2 23  --  24 27 28 29   GLUCOSE 216*  --  263* 87 152* 186*  BUN 132*  --  130* 86* 90* 69*  CREATININE 8.53*  --  8.36* 6.22* 6.75* 5.86*  CALCIUM 6.2*  --  6.8* 7.3* 7.0* 6.9*  MG  --  2.5*  --   --   --   --   PHOS  --  8.0* 8.4* 5.9* 6.6*  --     Liver Function Tests: Recent Labs  Lab 05/20/18 0415  05/20/18 1950 05/21/18 1012  ALBUMIN 3.0* 2.9* 2.4*   Recent Labs  Lab 05/20/18 0057  LIPASE 32   No results for input(s): AMMONIA in the last 168 hours.  CBC: Recent Labs  Lab 05/19/18 1320 05/20/18 0415 05/20/18 1950 05/21/18 1011 05/22/18 0330  WBC 8.3 10.1 10.8* 9.4 7.9  HGB 9.5* 10.8* 10.1* 9.4* 9.0*  HCT 29.3* 33.3* 30.7* 28.5* 27.9*  MCV 91.3 91.7 89.8 91.6 91.5  PLT 207 234 234 204 203    Cardiac Enzymes: Recent Labs  Lab 05/19/18 2343 05/20/18 0415 05/20/18 1046  TROPONINI 0.04* 0.04* 0.05*    BNP: Invalid input(s): POCBNP  CBG: Recent Labs  Lab 05/22/18 1135 05/22/18 1425 05/22/18 1741 05/22/18 2127 05/23/18 0745  GLUCAP 105* 122* 210* 217* 104*    Microbiology: Results for orders placed or performed during the hospital encounter of 05/19/18  Blood culture (routine x 2)     Status: None (Preliminary result)   Collection Time: 05/19/18  9:14 PM  Result Value Ref Range Status   Specimen Description BLOOD LEFT ANTECUBITAL  Final   Special Requests   Final  BOTTLES DRAWN AEROBIC AND ANAEROBIC Blood Culture adequate volume   Culture   Final    NO GROWTH 3 DAYS Performed at [facility], [place] [address], [place], [place] [address]    Report Status PENDING  Incomplete  Blood culture (routine x 2)     Status: None (Preliminary result)   Collection Time: 05/19/18  9:31 PM  Result Value Ref Range Status   Specimen Description BLOOD RIGHT HAND  Final   Special Requests   Final    BOTTLES DRAWN AEROBIC AND ANAEROBIC Blood Culture adequate volume   Culture   Final    NO GROWTH 3 DAYS Performed at [facility], [place] [address] [place], [place] [address]    Report Status PENDING  Incomplete  MRSA PCR Screening     Status: None   Collection Time: 05/20/18  6:28 PM  Result Value Ref Range Status   MRSA by PCR NEGATIVE NEGATIVE Final    Comment:        The GeneXpert MRSA Assay (FDA approved for NASAL specimens only), is one  component of a comprehensive MRSA colonization surveillance program. It is not intended to diagnose MRSA infection nor to guide or monitor treatment for MRSA infections. Performed at [facility], [place] [address], [place],  [address]     Coagulation Studies: No results for input(s): LABPROT, INR in the last 72 hours.  Urinalysis: No results for input(s): COLORURINE, LABSPEC, PHURINE, GLUCOSEU, HGBUR, BILIRUBINUR, KETONESUR, PROTEINUR, UROBILINOGEN, NITRITE, LEUKOCYTESUR in the last 72 hours.  Invalid input(s): APPERANCEUR    Imaging: Dg Chest Port 1 View  Result Date: 05/22/2018 CLINICAL DATA:  Status post right IJ approach dialysis catheter today. EXAM: PORTABLE CHEST 1 VIEW COMPARISON:  Single-view of the chest 05/22/2018. PA and lateral chest 05/19/2018. CT chest 05/19/2018. FINDINGS: New right IJ approach dialysis catheter is in place. Tip of the catheter projects just above the superior cavoatrial junction. No pneumothorax. Cardiomegaly and bilateral mid and lower lung zone airspace disease persist without notable change since the study earlier today. IMPRESSION: Dialysis catheter tip projects near the superior cavoatrial junction. No pneumothorax. No change in airspace disease in the mid and lower lung zone since the exam earlier today. Electronically Signed   By: Inge Rise M.D.   On: 05/22/2018 14:34   Dg Chest Port 1 View  Result Date: 05/22/2018 CLINICAL DATA:  Hypoxia. EXAM: PORTABLE CHEST 1 VIEW COMPARISON:  05/19/2018 FINDINGS: Diminishing pulmonary edema. Some edema does persist. Small amount of pleural fluid bilaterally. Central line tips in the proximal right atrium. No pneumothorax. IMPRESSION: Radiographic improvement with diminishing edema. Electronically Signed   By: Nelson Chimes M.D.   On: 05/22/2018 08:55     Medications:   . sodium chloride 10 mL/hr (05/22/18 1213)  . ferric gluconate (FERRLECIT/NULECIT) IV 125 mg (05/21/18 1207)  .  furosemide Stopped (05/22/18 0716)   . amLODipine  10 mg Oral QHS  . aspirin  81 mg Oral Daily  . carvedilol  25 mg Oral BID WC  . Chlorhexidine Gluconate Cloth  6 each Topical Q0600  . darbepoetin (ARANESP) injection - DIALYSIS  150 mcg Intravenous Q Fri-HD  . heparin  5,000 Units Subcutaneous Q8H  . insulin aspart  0-9 Units Subcutaneous TID WC  . insulin glargine  26 Units Subcutaneous Daily  . lanthanum  1,000 mg Oral TID WC  . multivitamin  1 tablet Oral QHS   acetaminophen, albuterol, dextromethorphan-guaiFENesin, hydrALAZINE, hydrOXYzine, hydrOXYzine, nitroGLYCERIN, oxyCODONE-acetaminophen,  zolpidem  Assessment/ Plan:  1. CKD 5 with progressive vol xs, uremic.New ESRD 1. Had HD#3  6/1  No need for dialysis over the weekend and think that we can wait until Monday for his next session 2. Appreciate Dr. Claretha Cooper help in tunneling the catheter and placing fistula  3. CLIP(probably will CLIP to [facility]) 4. Has passed window for elective PD, but will consider in future. Interested in PD and will initiate home training consult once discharged. 2.DM controlled 3.Hypertension:looks decent control of volume  4. Anemia of ESRD: iron and aranesp  5. Metabolic Bone Disease:PTH  81 a little low although we shall follow on dialysis Goal PTH ( 110 - 600 ) . Lanthanum with meals started for hyperphosphatemia  6.NONADHERENCE ^ risk or complic        LOS: 4 Michaela Broski W @TODAY @10 :09 AM

## 2018-05-23 NOTE — Progress Notes (Signed)
PROGRESS NOTE    [name] [name]  GBT:[id number] DOB: 11-Jan-1966 DOA: 05/19/2018 PCP: Christain Sacramento, MD  Brief Narrative:this is a 52 year old male with history of type 2 diabetes, hypertension, progressive CKD 5 presented to the emergency room with cough, dyspnea, nausea and vomiting. -in the emergency room room noted to have pulmonary edema, BUN of 130, creatinine of 8.3, potassium -had a CT chest which is concerning for pulmonary edema, pleural effusions and bilateral infiltrates  Assessment & Plan:   Pulmonary edema/Progressive CKD5 -Renal consulting, s/p urgent HD catheter 5/29 followed by dialysis-first Rx 5/29 -improving,  -CT chest: also noted areas concerning for infiltrate/pneumonia, on empiric antibiotic coverage for now however clinically less likely to be pneumonia, repeat chest x-ray with improving pulmonary edema -stopped all antibiotics, weaned off O2 -per Renal -RD consult, diabetic education  Acute hypoxic respiratory failure -Due to pulmonary edema as noted above -resolved  CKD 5/New ESRD -as above -per nephrology, status post upper HD catheter and the fistula 5/31 -Clip for outpatient HD  Acute on chronic diastolic CHF -volume overloaded state due to CKD 5 -as above -stop Lasix, volume management HD  Type 2 diabetes mellitus -Last hemoglobin A1c was 6.3 in April -Continue Tresiba will decrease dose, sliding scale insulin  Anemia due to chronic kidney disease -epo with HD  DVT prophylaxis:heparin subcutaneous Code Status: full code Family Communication:no family at bedside Disposition Plan: home when pulm edema improved and clip complete  Consultants:   Nephrology  Interventional radiology  vascular   Procedures: 5/31 1.  Placement of right IJ 23 cm tunneled dialysis catheter 2.  Left brachiocephalic AV fistula     Antimicrobials:    Subjective: -continues to feel better, breathing improved, feels normal  finally  Objective: Vitals:   05/23/18 0100 05/23/18 0225 05/23/18 0531 05/23/18 0830  BP:  129/68 129/72 130/73  Pulse:   78 79  Resp: (!) 24 16 17  (!) 23  Temp:   98.6 F (37 C) 98.2 F (36.8 C)  TempSrc:   Oral   SpO2: 93% 95% 96% 98%  Weight:      Height:        Intake/Output Summary (Last 24 hours) at 05/23/2018 1112 Last data filed at 05/23/2018 0950 Gross per 24 hour  Intake 1005 ml  Output 595 ml  Net 410 ml   Filed Weights   05/21/18 1226 05/22/18 0710 05/22/18 1200  Weight: 84.7 kg (186 lb 11.7 oz) 85.2 kg (187 lb 13.3 oz) 85.2 kg (187 lb 13.3 oz)    Examination:  Gen: Awake, Alert, Oriented X 3,  HEENT: PERRLA, Neck supple, no JVD Lungs: Good air movement bilaterally, CTAB, HD catheter noted CVS: RRR,No Gallops,Rubs or new Murmurs Abd: soft, Non tender, non distended, BS present Extremities: trace edema, left arm U AV fistula Skin: no new rashes Psychiatry: Judgement and insight appear normal. Mood & affect appropriate.     Data Reviewed:   CBC: Recent Labs  Lab 05/19/18 1320 05/20/18 0415 05/20/18 1950 05/21/18 1011 05/22/18 0330  WBC 8.3 10.1 10.8* 9.4 7.9  HGB 9.5* 10.8* 10.1* 9.4* 9.0*  HCT 29.3* 33.3* 30.7* 28.5* 27.9*  MCV 91.3 91.7 89.8 91.6 91.5  PLT 207 234 234 204 710   Basic Metabolic Panel: Recent Labs  Lab 05/19/18 1320 05/19/18 1728 05/20/18 0415 05/20/18 1950 05/21/18 1012 05/22/18 0330  NA 139  --  139 138 140 138  K 4.0  --  4.0 3.4* 3.4* 3.6  CL 100*  --  98* 98* 100* 101  CO2 23  --  24 27 28 29   GLUCOSE 216*  --  263* 87 152* 186*  BUN 132*  --  130* 86* 90* 69*  CREATININE 8.53*  --  8.36* 6.22* 6.75* 5.86*  CALCIUM 6.2*  --  6.8* 7.3* 7.0* 6.9*  MG  --  2.5*  --   --   --   --   PHOS  --  8.0* 8.4* 5.9* 6.6*  --    GFR: Estimated Creatinine Clearance: 17.3 mL/min (A) (by C-G formula based on SCr of 5.86 mg/dL (H)). Liver Function Tests: Recent Labs  Lab 05/20/18 0415 05/20/18 1950 05/21/18 1012  ALBUMIN  3.0* 2.9* 2.4*   Recent Labs  Lab 05/20/18 0057  LIPASE 32   No results for input(s): AMMONIA in the last 168 hours. Coagulation Profile: No results for input(s): INR, PROTIME in the last 168 hours. Cardiac Enzymes: Recent Labs  Lab 05/19/18 2343 05/20/18 0415 05/20/18 1046  TROPONINI 0.04* 0.04* 0.05*   BNP (last 3 results) No results for input(s): PROBNP in the last 8760 hours. HbA1C: No results for input(s): HGBA1C in the last 72 hours. CBG: Recent Labs  Lab 05/22/18 1135 05/22/18 1425 05/22/18 1741 05/22/18 2127 05/23/18 0745  GLUCAP 105* 122* 210* 217* 268*   Lipid Profile: No results for input(s): CHOL, HDL, LDLCALC, TRIG, CHOLHDL, LDLDIRECT in the last 72 hours. Thyroid Function Tests: No results for input(s): TSH, T4TOTAL, FREET4, T3FREE, THYROIDAB in the last 72 hours. Anemia Panel: No results for input(s): VITAMINB12, FOLATE, FERRITIN, TIBC, IRON, RETICCTPCT in the last 72 hours. Urine analysis:    Component Value Date/Time   COLORURINE YELLOW 05/20/2018 0711   APPEARANCEUR HAZY (A) 05/20/2018 0711   LABSPEC 1.015 05/20/2018 0711   PHURINE 5.5 05/20/2018 0711   GLUCOSEU NEGATIVE 05/20/2018 0711   HGBUR SMALL (A) 05/20/2018 0711   BILIRUBINUR NEGATIVE 05/20/2018 0711   BILIRUBINUR negative 04/20/2018 1452   KETONESUR NEGATIVE 05/20/2018 0711   PROTEINUR 100 (A) 05/20/2018 0711   UROBILINOGEN 0.2 04/20/2018 1452   UROBILINOGEN 0.2 04/09/2012 2229   NITRITE NEGATIVE 05/20/2018 0711   LEUKOCYTESUR NEGATIVE 05/20/2018 0711   Sepsis Labs: @LABRCNTIP (procalcitonin:4,lacticidven:4)  ) Recent Results (from the past 240 hour(s))  Blood culture (routine x 2)     Status: None (Preliminary result)   Collection Time: 05/19/18  9:14 PM  Result Value Ref Range Status   Specimen Description BLOOD LEFT ANTECUBITAL  Final   Special Requests   Final    BOTTLES DRAWN AEROBIC AND ANAEROBIC Blood Culture adequate volume   Culture   Final    NO GROWTH 3  DAYS Performed at [facility], [place] [address], [place], [place] [address]    Report Status PENDING  Incomplete  Blood culture (routine x 2)     Status: None (Preliminary result)   Collection Time: 05/19/18  9:31 PM  Result Value Ref Range Status   Specimen Description BLOOD RIGHT HAND  Final   Special Requests   Final    BOTTLES DRAWN AEROBIC AND ANAEROBIC Blood Culture adequate volume   Culture   Final    NO GROWTH 3 DAYS Performed at [facility], [place] [address] [place], [place] [address]    Report Status PENDING  Incomplete  MRSA PCR Screening     Status: None   Collection Time: 05/20/18  6:28 PM  Result Value Ref Range Status   MRSA by PCR NEGATIVE NEGATIVE Final  Comment:        The GeneXpert MRSA Assay (FDA approved for NASAL specimens only), is one component of a comprehensive MRSA colonization surveillance program. It is not intended to diagnose MRSA infection nor to guide or monitor treatment for MRSA infections. Performed at [facility], [place] [address], [place], [place] [address]          Radiology Studies: Dg Chest Port 1 View  Result Date: 05/22/2018 CLINICAL DATA:  Status post right IJ approach dialysis catheter today. EXAM: PORTABLE CHEST 1 VIEW COMPARISON:  Single-view of the chest 05/22/2018. PA and lateral chest 05/19/2018. CT chest 05/19/2018. FINDINGS: New right IJ approach dialysis catheter is in place. Tip of the catheter projects just above the superior cavoatrial junction. No pneumothorax. Cardiomegaly and bilateral mid and lower lung zone airspace disease persist without notable change since the study earlier today. IMPRESSION: Dialysis catheter tip projects near the superior cavoatrial junction. No pneumothorax. No change in airspace disease in the mid and lower lung zone since the exam earlier today. Electronically Signed   By: Inge Rise M.D.   On: 05/22/2018 14:34   Dg Chest Port 1 View  Result Date:  05/22/2018 CLINICAL DATA:  Hypoxia. EXAM: PORTABLE CHEST 1 VIEW COMPARISON:  05/19/2018 FINDINGS: Diminishing pulmonary edema. Some edema does persist. Small amount of pleural fluid bilaterally. Central line tips in the proximal right atrium. No pneumothorax. IMPRESSION: Radiographic improvement with diminishing edema. Electronically Signed   By: Nelson Chimes M.D.   On: 05/22/2018 08:55        Scheduled Meds: . amLODipine  10 mg Oral QHS  . aspirin  81 mg Oral Daily  . carvedilol  25 mg Oral BID WC  . Chlorhexidine Gluconate Cloth  6 each Topical Q0600  . darbepoetin (ARANESP) injection - DIALYSIS  150 mcg Intravenous Q Fri-HD  . heparin  5,000 Units Subcutaneous Q8H  . insulin aspart  0-9 Units Subcutaneous TID WC  . insulin glargine  26 Units Subcutaneous Daily  . lanthanum  1,000 mg Oral TID WC  . multivitamin  1 tablet Oral QHS   Continuous Infusions: . sodium chloride 10 mL/hr (05/22/18 1213)  . ferric gluconate (FERRLECIT/NULECIT) IV 125 mg (05/21/18 1207)     LOS: 4 days    Time spent: 31min    Domenic Polite, MD [facility] Page via [email or web address], password [email or web address] After 7PM please contact night-coverage  05/23/2018, 11:12 AM

## 2018-05-23 NOTE — Evaluation (Signed)
Physical Therapy Evaluation & Discharge Patient Details Name: [name] [name] MRN: [id number] DOB: 01/14/1966 Today's Date: 05/23/2018   History of Present Illness  Pt is a 52 y/o male admitted secondary to SOB. Pt is s/p Placement of right IJ 23 cm tunneled dialysis catheter and Left brachiocephalic AV fistula. PMH including but not limited to ESRD (not started HD yet), hypertension, diabetes mellitus, dCHF.    Clinical Impression  Pt presented sitting upright in recliner chair, awake and willing to participate in therapy session. Prior to admission, pt reported that he was independent with all functional mobility and ADLs. Pt works full-time, nights. He is currently able to perform transfers with supervision, ambulate in hallway with min guard without use of an AD and ascend/descend stairs with min guard and use of railings. PT discussed general bilateral LE strengthening HEP including squats, hip abduction/adduction in standing and leg press (pt planning to get a membership to a gym). PT answered all of pt's questions. No further acute PT needs identified at this time. PT signing off.     Follow Up Recommendations No PT follow up    Equipment Recommendations  None recommended by PT    Recommendations for Other Services       Precautions / Restrictions Precautions Precautions: None Restrictions Weight Bearing Restrictions: No      Mobility  Bed Mobility               General bed mobility comments: pt OOB in recliner chair upon arrival  Transfers Overall transfer level: Needs assistance Equipment used: None Transfers: Sit to/from Stand Sit to Stand: Supervision         General transfer comment: supervision for safety  Ambulation/Gait Ambulation/Gait assistance: Min guard Ambulation Distance (Feet): 200 Feet Assistive device: None Gait Pattern/deviations: Step-through pattern Gait velocity: WFL Gait velocity interpretation: >2.62 ft/sec, indicative of  community ambulatory General Gait Details: pt with minor instability but able to self-correct, no use of an AD and no need for physical assistance.  Stairs Stairs: Yes Stairs assistance: Min guard Stair Management: One rail Right;Two rails;Alternating pattern;Forwards Number of Stairs: 10 General stair comments: no instability or LOB, min guard for safety; no SOB; daughter present  Wheelchair Mobility    Modified Rankin (Stroke Patients Only)       Balance Overall balance assessment: Needs assistance Sitting-balance support: Feet supported Sitting balance-Leahy Scale: Normal     Standing balance support: During functional activity;No upper extremity supported Standing balance-Leahy Scale: Good                               Pertinent Vitals/Pain Pain Assessment: No/denies pain    Home Living Family/patient expects to be discharged to:: Private residence Living Arrangements: Spouse/significant other Available Help at Discharge: Family Type of Home: Other(Comment)(Townhouse) Home Access: Stairs to enter Entrance Stairs-Rails: Psychiatric nurse of Steps: 6 Home Layout: Two level Home Equipment: None      Prior Function Level of Independence: Independent         Comments: works full-time, works nights     Journalist, newspaper        Extremity/Trunk Assessment   Upper Extremity Assessment Upper Extremity Assessment: Overall WFL for tasks assessed    Lower Extremity Assessment Lower Extremity Assessment: Overall WFL for tasks assessed    Cervical / Trunk Assessment Cervical / Trunk Assessment: Normal  Communication   Communication: No difficulties  Cognition Arousal/Alertness: Awake/alert Behavior During Therapy:  WFL for tasks assessed/performed Overall Cognitive Status: Within Functional Limits for tasks assessed                                        General Comments      Exercises     Assessment/Plan     PT Assessment Patent does not need any further PT services  PT Problem List         PT Treatment Interventions      PT Goals (Current goals can be found in the Care Plan section)  Acute Rehab PT Goals Patient Stated Goal: return home, get PD catheter placement, continue to travel    Frequency     Barriers to discharge        Co-evaluation               AM-PAC PT "6 Clicks" Daily Activity  Outcome Measure Difficulty turning over in bed (including adjusting bedclothes, sheets and blankets)?: None Difficulty moving from lying on back to sitting on the side of the bed? : None Difficulty sitting down on and standing up from a chair with arms (e.g., wheelchair, bedside commode, etc,.)?: None Help needed moving to and from a bed to chair (including a wheelchair)?: None Help needed walking in hospital room?: None Help needed climbing 3-5 steps with a railing? : None 6 Click Score: 24    End of Session Equipment Utilized During Treatment: Gait belt Activity Tolerance: Patient tolerated treatment well Patient left: in chair;with call bell/phone within reach;with family/visitor present Nurse Communication: Mobility status PT Visit Diagnosis: Other abnormalities of gait and mobility (R26.89)    Time: 4801-6553 PT Time Calculation (min) (ACUTE ONLY): 23 min   Charges:   PT Evaluation $PT Eval Moderate Complexity: 1 Mod PT Treatments $Gait Training: 8-22 mins   PT G Codes:        [place], PT, DPT [place] 05/23/2018, 1:04 PM

## 2018-05-24 LAB — GLUCOSE, CAPILLARY
Glucose-Capillary: 236 mg/dL — ABNORMAL HIGH (ref 65–99)
Glucose-Capillary: 169 mg/dL — ABNORMAL HIGH (ref 65–99)
Glucose-Capillary: 143 mg/dL — ABNORMAL HIGH (ref 65–99)
Glucose-Capillary: 221 mg/dL — ABNORMAL HIGH (ref 65–99)

## 2018-05-24 LAB — CBC
HCT: 29.7 % — ABNORMAL LOW (ref 39.0–52.0)
Hemoglobin: 9.3 g/dL — ABNORMAL LOW (ref 13.0–17.0)
MCH: 29.1 pg (ref 26.0–34.0)
MCHC: 31.3 g/dL (ref 30.0–36.0)
MCV: 92.8 fL (ref 78.0–100.0)
Platelets: 269 10*3/uL (ref 150–400)
RBC: 3.2 MIL/uL — ABNORMAL LOW (ref 4.22–5.81)
RDW: 11.9 % (ref 11.5–15.5)
WBC: 10.7 10*3/uL — ABNORMAL HIGH (ref 4.0–10.5)

## 2018-05-24 LAB — CULTURE, BLOOD (ROUTINE X 2)
Culture: NO GROWTH
Culture: NO GROWTH
Special Requests: ADEQUATE
Special Requests: ADEQUATE

## 2018-05-24 LAB — BASIC METABOLIC PANEL
Anion gap: 11 (ref 5–15)
BUN: 77 mg/dL — ABNORMAL HIGH (ref 6–20)
CO2: 28 mmol/L (ref 22–32)
Calcium: 7.7 mg/dL — ABNORMAL LOW (ref 8.9–10.3)
Chloride: 97 mmol/L — ABNORMAL LOW (ref 101–111)
Creatinine, Ser: 7.18 mg/dL — ABNORMAL HIGH (ref 0.61–1.24)
GFR calc Af Amer: 9 mL/min — ABNORMAL LOW (ref >60)
GFR calc non Af Amer: 8 mL/min — ABNORMAL LOW (ref >60)
Glucose, Bld: 246 mg/dL — ABNORMAL HIGH (ref 65–99)
Potassium: 4.3 mmol/L (ref 3.5–5.1)
Sodium: 136 mmol/L (ref 135–145)

## 2018-05-24 MED ORDER — VALACYCLOVIR HCL 500 MG PO TABS
500.0000 mg | ORAL_TABLET | Freq: Every day | ORAL | Status: DC
  Administered 2018-05-24 – 2018-05-25 (×2): 500 mg via ORAL
  Filled 2018-05-24 (×3): qty 1

## 2018-05-24 MED ORDER — INSULIN GLARGINE 100 UNIT/ML ~~LOC~~ SOLN
45.0000 [IU] | Freq: Every day | SUBCUTANEOUS | Status: DC
  Administered 2018-05-25: 45 [IU] via SUBCUTANEOUS
  Filled 2018-05-24: qty 0.45

## 2018-05-24 MED ORDER — INSULIN GLARGINE 100 UNIT/ML ~~LOC~~ SOLN
10.0000 [IU] | Freq: Once | SUBCUTANEOUS | Status: AC
  Administered 2018-05-24: 10 [IU] via SUBCUTANEOUS
  Filled 2018-05-24: qty 0.1

## 2018-05-24 MED ORDER — CARVEDILOL 12.5 MG PO TABS
12.5000 mg | ORAL_TABLET | Freq: Two times a day (BID) | ORAL | Status: DC
  Administered 2018-05-24 (×2): 12.5 mg via ORAL
  Filled 2018-05-24 (×2): qty 1

## 2018-05-24 MED ORDER — SILVER SULFADIAZINE 1 % EX CREA
1.0000 | TOPICAL_CREAM | Freq: Two times a day (BID) | CUTANEOUS | Status: DC
Start: 2018-05-24 — End: 2018-05-25
  Administered 2018-05-24: 1 via TOPICAL
  Filled 2018-05-24: qty 85

## 2018-05-24 NOTE — Progress Notes (Signed)
PROGRESS NOTE    [name] [name]  ZOX:[id number] DOB: Jul 05, 1966 DOA: 05/19/2018 PCP: Christain [place], MD  Brief Narrative:this is a 52 year old male with history of type 2 diabetes, hypertension, progressive CKD 5 presented to the emergency room with cough, dyspnea, nausea and vomiting. -in the emergency room room noted to have pulmonary edema, BUN of 130, creatinine of 8.3, potassium -had a CT chest which is concerning for pulmonary edema, pleural effusions and bilateral infiltrates  Assessment & Plan:   Pulmonary edema/Progressive CKD5 -Renal consulting, s/p urgent HD catheter 5/29 followed by dialysis-first Rx 5/29 -improving,  -CT chest: also noted areas concerning for infiltrate/pneumonia, on empiric antibiotic coverage for now however clinically less likely to be pneumonia, repeat chest x-ray with improving pulmonary edema -stopped all antibiotics, weaned off O2 -per Renal -RD consult, diabetic education  Acute hypoxic respiratory failure -Due to pulmonary edema as noted above -resolved  CKD 5/New ESRD -as above -per nephrology, status post upper HD catheter and the fistula 5/31 -Clip for outpatient HD  Acute on chronic diastolic CHF -volume overloaded state due to CKD 5 -as above -stop Lasix, volume management HD  Type 2 diabetes mellitus -Last hemoglobin A1c was 6.3 in April -Continue lantus will increase dose  Anemia due to chronic kidney disease -epo with HD  DVT prophylaxis:heparin subcutaneous Code Status: full code Family Communication:no family at bedside Disposition Plan: home when pulm edema improved and clip complete  Consultants:   Nephrology  Interventional radiology  vascular   Procedures: 5/31 1.  Placement of right IJ 23 cm tunneled dialysis catheter 2.  Left brachiocephalic AV fistula     Antimicrobials:    Subjective: -continues to feel better, breathing improved, feels normal finally  Objective: Vitals:   05/23/18  1815 05/23/18 2049 05/23/18 2347 05/24/18 0847  BP: 135/69 118/65 128/71 132/62  Pulse: 80  82 79  Resp: 18  (!) 24 20  Temp: 98.9 F (37.2 C)  98.2 F (36.8 C) 98.2 F (36.8 C)  TempSrc: Oral  Oral Oral  SpO2: 95%  98%   Weight:      Height:        Intake/Output Summary (Last 24 hours) at 05/24/2018 1143 Last data filed at 05/24/2018 0845 Gross per 24 hour  Intake 598 ml  Output -  Net 598 ml   Filed Weights   05/21/18 1226 05/22/18 0710 05/22/18 1200  Weight: 84.7 kg (186 lb 11.7 oz) 85.2 kg (187 lb 13.3 oz) 85.2 kg (187 lb 13.3 oz)    Examination:  Gen: Awake, Alert, Oriented X 3,  HEENT: PERRLA, Neck supple, no JVD Lungs: Good air movement bilaterally, CTAB, HD catheter noted CVS: RRR,No Gallops,Rubs or new Murmurs Abd: soft, Non tender, non distended, BS present Extremities: trace edema, left arm U AV fistula Skin: no new rashes Psychiatry: Judgement and insight appear normal. Mood & affect appropriate.     Data Reviewed:   CBC: Recent Labs  Lab 05/20/18 0415 05/20/18 1950 05/21/18 1011 05/22/18 0330 05/24/18 0316  WBC 10.1 10.8* 9.4 7.9 10.7*  HGB 10.8* 10.1* 9.4* 9.0* 9.3*  HCT 33.3* 30.7* 28.5* 27.9* 29.7*  MCV 91.7 89.8 91.6 91.5 92.8  PLT 234 234 204 203 811   Basic Metabolic Panel: Recent Labs  Lab 05/19/18 1728 05/20/18 0415 05/20/18 1950 05/21/18 1012 05/22/18 0330 05/24/18 0316  NA  --  139 138 140 138 136  K  --  4.0 3.4* 3.4* 3.6 4.3  CL  --  98* 98*  100* 101 97*  CO2  --  24 27 28 29 28   GLUCOSE  --  263* 87 152* 186* 246*  BUN  --  130* 86* 90* 69* 77*  CREATININE  --  8.36* 6.22* 6.75* 5.86* 7.18*  CALCIUM  --  6.8* 7.3* 7.0* 6.9* 7.7*  MG 2.5*  --   --   --   --   --   PHOS 8.0* 8.4* 5.9* 6.6*  --   --    GFR: Estimated Creatinine Clearance: 14.2 mL/min (A) (by C-G formula based on SCr of 7.18 mg/dL (H)). Liver Function Tests: Recent Labs  Lab 05/20/18 0415 05/20/18 1950 05/21/18 1012  ALBUMIN 3.0* 2.9* 2.4*   Recent  Labs  Lab 05/20/18 0057  LIPASE 32   No results for input(s): AMMONIA in the last 168 hours. Coagulation Profile: No results for input(s): INR, PROTIME in the last 168 hours. Cardiac Enzymes: Recent Labs  Lab 05/19/18 2343 05/20/18 0415 05/20/18 1046  TROPONINI 0.04* 0.04* 0.05*   BNP (last 3 results) No results for input(s): PROBNP in the last 8760 hours. HbA1C: No results for input(s): HGBA1C in the last 72 hours. CBG: Recent Labs  Lab 05/23/18 0745 05/23/18 1208 05/23/18 1736 05/23/18 2100 05/24/18 0825  GLUCAP 268* 265* 176* 241* 236*   Lipid Profile: No results for input(s): CHOL, HDL, LDLCALC, TRIG, CHOLHDL, LDLDIRECT in the last 72 hours. Thyroid Function Tests: No results for input(s): TSH, T4TOTAL, FREET4, T3FREE, THYROIDAB in the last 72 hours. Anemia Panel: No results for input(s): VITAMINB12, FOLATE, FERRITIN, TIBC, IRON, RETICCTPCT in the last 72 hours. Urine analysis:    Component Value Date/Time   COLORURINE YELLOW 05/20/2018 0711   APPEARANCEUR HAZY (A) 05/20/2018 0711   LABSPEC 1.015 05/20/2018 0711   PHURINE 5.5 05/20/2018 0711   GLUCOSEU NEGATIVE 05/20/2018 0711   HGBUR SMALL (A) 05/20/2018 0711   BILIRUBINUR NEGATIVE 05/20/2018 0711   BILIRUBINUR negative 04/20/2018 1452   KETONESUR NEGATIVE 05/20/2018 0711   PROTEINUR 100 (A) 05/20/2018 0711   UROBILINOGEN 0.2 04/20/2018 1452   UROBILINOGEN 0.2 04/09/2012 2229   NITRITE NEGATIVE 05/20/2018 0711   LEUKOCYTESUR NEGATIVE 05/20/2018 0711   Sepsis Labs: @LABRCNTIP (procalcitonin:4,lacticidven:4)  ) Recent Results (from the past 240 hour(s))  Blood culture (routine x 2)     Status: None (Preliminary result)   Collection Time: 05/19/18  9:14 PM  Result Value Ref Range Status   Specimen Description BLOOD LEFT ANTECUBITAL  Final   Special Requests   Final    BOTTLES DRAWN AEROBIC AND ANAEROBIC Blood Culture adequate volume   Culture   Final    NO GROWTH 4 DAYS Performed at [facility], [place] [address], [place], [place] [address]    Report Status PENDING  Incomplete  Blood culture (routine x 2)     Status: None (Preliminary result)   Collection Time: 05/19/18  9:31 PM  Result Value Ref Range Status   Specimen Description BLOOD RIGHT HAND  Final   Special Requests   Final    BOTTLES DRAWN AEROBIC AND ANAEROBIC Blood Culture adequate volume   Culture   Final    NO GROWTH 4 DAYS Performed at [facility], [place] [address], [place], [place] [address]    Report Status PENDING  Incomplete  MRSA PCR Screening     Status: None   Collection Time: 05/20/18  6:28 PM  Result Value Ref Range Status   MRSA by PCR NEGATIVE NEGATIVE Final  Comment:        The GeneXpert MRSA Assay (FDA approved for NASAL specimens only), is one component of a comprehensive MRSA colonization surveillance program. It is not intended to diagnose MRSA infection nor to guide or monitor treatment for MRSA infections. Performed at [facility], [place] [address], [place], [place] [address]   Respiratory Panel by PCR     Status: None   Collection Time: 05/23/18  7:34 AM  Result Value Ref Range Status   Adenovirus NOT DETECTED NOT DETECTED Final   Coronavirus 229E NOT DETECTED NOT DETECTED Final   Coronavirus HKU1 NOT DETECTED NOT DETECTED Final   Coronavirus NL63 NOT DETECTED NOT DETECTED Final   Coronavirus OC43 NOT DETECTED NOT DETECTED Final   Metapneumovirus NOT DETECTED NOT DETECTED Final   Rhinovirus / Enterovirus NOT DETECTED NOT DETECTED Final   Influenza A NOT DETECTED NOT DETECTED Final   Influenza B NOT DETECTED NOT DETECTED Final   Parainfluenza Virus 1 NOT DETECTED NOT DETECTED Final   Parainfluenza Virus 2 NOT DETECTED NOT DETECTED Final   Parainfluenza Virus 3 NOT DETECTED NOT DETECTED Final   Parainfluenza Virus 4 NOT DETECTED NOT DETECTED Final   Respiratory Syncytial Virus NOT DETECTED NOT DETECTED Final   Bordetella pertussis NOT DETECTED NOT DETECTED Final     Chlamydophila pneumoniae NOT DETECTED NOT DETECTED Final   Mycoplasma pneumoniae NOT DETECTED NOT DETECTED Final    Comment: Performed at [facility], [place] [address], [place], [place] [address]         Radiology Studies: Dg Chest Port 1 View  Result Date: 05/22/2018 CLINICAL DATA:  Status post right IJ approach dialysis catheter today. EXAM: PORTABLE CHEST 1 VIEW COMPARISON:  Single-view of the chest 05/22/2018. PA and lateral chest 05/19/2018. CT chest 05/19/2018. FINDINGS: New right IJ approach dialysis catheter is in place. Tip of the catheter projects just above the superior cavoatrial junction. No pneumothorax. Cardiomegaly and bilateral mid and lower lung zone airspace disease persist without notable change since the study earlier today. IMPRESSION: Dialysis catheter tip projects near the superior cavoatrial junction. No pneumothorax. No change in airspace disease in the mid and lower lung zone since the exam earlier today. Electronically Signed   By: Inge Rise M.D.   On: 05/22/2018 14:34        Scheduled Meds: . amLODipine  10 mg Oral QHS  . aspirin  81 mg Oral Daily  . carvedilol  12.5 mg Oral BID WC  . Chlorhexidine Gluconate Cloth  6 each Topical Q0600  . darbepoetin (ARANESP) injection - DIALYSIS  150 mcg Intravenous Q Fri-HD  . heparin  5,000 Units Subcutaneous Q8H  . insulin aspart  0-9 Units Subcutaneous TID WC  . insulin glargine  35 Units Subcutaneous Daily  . lanthanum  1,000 mg Oral TID WC  . multivitamin  1 tablet Oral QHS   Continuous Infusions: . sodium chloride 10 mL/hr (05/22/18 1213)  . ferric gluconate (FERRLECIT/NULECIT) IV       LOS: 5 days    Time spent: 16min    Domenic Polite, MD [facility] Page via [email or web address], password [email or web address] After 7PM please contact night-coverage  05/24/2018, 11:43 AM

## 2018-05-24 NOTE — Progress Notes (Signed)
This RN assisted with accepting patient from 2W. Pt expressed concerns about his care up until this point and stated that he would not be "hooked to anymore beeping monitors or confined to this room with one window." RN informed pt that while on airborne contact, pt could not exit room to walk halls. Pt again verbalized frustration and stated that if it was not for his outpatient dialysis needing to be set up he would call himself an uber and leave tonight. RN provided emotional support to pt.  RN called IP on call to explain presentation of lower back site. Both this RN and Palma Holter, Primary RN assessed site. Site was dry and intact. IP expressed that if there was not weeping or wetness, pt did not need to be on airborne precautions.
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
## 2018-05-24 NOTE — Progress Notes (Signed)
When pt got transferred to unit, RN called immediately to kitchen and placed the the order and informed them about the transfer but tray still not here by 6.56pm, so called them back again now and they said it's on its way, pt is aware.  Palma Holter, RN
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
## 2018-05-24 NOTE — Progress Notes (Signed)
Bed Request placed for negative pressure room.  Patient placement, House Supervisor and MD called all notified.
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
## 2018-05-24 NOTE — Progress Notes (Signed)
Pt is non-tele, double verified with MD.

## 2018-05-25 ENCOUNTER — Telehealth: Payer: Self-pay | Admitting: Vascular Surgery

## 2018-05-25 LAB — RENAL FUNCTION PANEL
Albumin: 2.4 g/dL — ABNORMAL LOW (ref 3.5–5.0)
Anion gap: 9 (ref 5–15)
BUN: 92 mg/dL — ABNORMAL HIGH (ref 6–20)
CO2: 26 mmol/L (ref 22–32)
Calcium: 7.5 mg/dL — ABNORMAL LOW (ref 8.9–10.3)
Chloride: 102 mmol/L (ref 101–111)
Creatinine, Ser: 7.82 mg/dL — ABNORMAL HIGH (ref 0.61–1.24)
GFR calc Af Amer: 8 mL/min — ABNORMAL LOW (ref >60)
GFR calc non Af Amer: 7 mL/min — ABNORMAL LOW (ref >60)
Glucose, Bld: 163 mg/dL — ABNORMAL HIGH (ref 65–99)
Phosphorus: 4.1 mg/dL (ref 2.5–4.6)
Potassium: 4.1 mmol/L (ref 3.5–5.1)
Sodium: 137 mmol/L (ref 135–145)

## 2018-05-25 LAB — GLUCOSE, CAPILLARY: Glucose-Capillary: 115 mg/dL — ABNORMAL HIGH (ref 65–99)

## 2018-05-25 MED ORDER — LANTHANUM CARBONATE 1000 MG PO CHEW: 1000.0000 mg | CHEWABLE_TABLET | Freq: Three times a day (TID) | ORAL | 0 refills | Status: DC

## 2018-05-25 MED ORDER — SILVER SULFADIAZINE 1 % EX CREA: 1.0000 "application " | TOPICAL_CREAM | Freq: Two times a day (BID) | CUTANEOUS | 0 refills | Status: DC

## 2018-05-25 MED ORDER — VALACYCLOVIR HCL 500 MG PO TABS: 500.0000 mg | ORAL_TABLET | Freq: Every day | ORAL | 0 refills | Status: AC

## 2018-05-25 MED ORDER — LANTHANUM CARBONATE 1000 MG PO CHEW: 1000.0000 mg | CHEWABLE_TABLET | Freq: Three times a day (TID) | ORAL | 0 refills | Status: AC

## 2018-05-25 MED ORDER — VALACYCLOVIR HCL 500 MG PO TABS: 500.0000 mg | ORAL_TABLET | Freq: Every day | ORAL | 0 refills | Status: DC
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
## 2018-05-25 NOTE — Procedures (Signed)
Patient seen on Hemodialysis. QB 400, UF goal 2L Treatment adjusted as needed.  [name] Shiley MD [facility]. Office # [phone] Pager # [phone] 9:49 AM

## 2018-05-25 NOTE — Progress Notes (Signed)
Charge Nurse went over discharge information/medications with patient. Patient was waiting for home health to be set up, however left before this took place. Called case manager to make her aware.

## 2018-05-25 NOTE — Progress Notes (Signed)
HHRN arranged with [facility] as requested; Dan with [facility] called for arrangements; Aneta Mins [phone]

## 2018-05-25 NOTE — Progress Notes (Signed)
Discharge instructions reviewed with patient. IV and telemetry removed. Case management notified regarding home health RN order for dressing change. Patient appears impatient and wants to go home now. Case management spoke with pt via telephone. Now patient is not in the room. Was seen walking off the floor.

## 2018-05-25 NOTE — Telephone Encounter (Signed)
sch appt waiting on pt to confirm 07/10/18 1pm Dialysis Duplex 2pm p/o PA

## 2018-05-26 ENCOUNTER — Other Ambulatory Visit: Payer: Self-pay

## 2018-05-26 DIAGNOSIS — Z48812 Encounter for surgical aftercare following surgery on the circulatory system: Secondary | ICD-10-CM

## 2018-05-26 DIAGNOSIS — N186 End stage renal disease: Secondary | ICD-10-CM | POA: Diagnosis not present

## 2018-05-26 DIAGNOSIS — D649 Anemia, unspecified: Secondary | ICD-10-CM | POA: Diagnosis not present

## 2018-05-26 DIAGNOSIS — N185 Chronic kidney disease, stage 5: Secondary | ICD-10-CM

## 2018-05-26 LAB — PARATHYROID HORMONE, INTACT (NO CA): PTH: 50 pg/mL (ref 15–65)

## 2018-05-29 ENCOUNTER — Ambulatory Visit (HOSPITAL_COMMUNITY)
Admission: EM | Admit: 2018-05-29 | Discharge: 2018-05-29 | Disposition: A | Discharge status: Home / Self Care | Payer: 59 | Attending: Family Medicine | Admitting: Family Medicine

## 2018-05-29 ENCOUNTER — Encounter (HOSPITAL_COMMUNITY): Payer: Self-pay | Admitting: Family Medicine

## 2018-05-29 DIAGNOSIS — T24202D Burn of second degree of unspecified site of left lower limb, except ankle and foot, subsequent encounter: Secondary | ICD-10-CM | POA: Diagnosis not present

## 2018-05-29 MED ORDER — SILVER SULFADIAZINE 1 % EX CREA: TOPICAL_CREAM | Freq: Every day | CUTANEOUS | Status: DC

## 2018-05-29 MED ORDER — SILVER SULFADIAZINE 1 % EX CREA
TOPICAL_CREAM | CUTANEOUS | Status: AC
  Filled 2018-05-29: qty 85
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
## 2018-05-29 NOTE — Discharge Instructions (Addendum)
Dressing applied  Silvadene tube given in office (Apply to a thickness of 1/16 inch once or twice daily; reapply as needed to areas where the cream is removed by patient activity as the burned area should be covered with cream at all times. Continue use until healing has occurred or the burn site is ready for grafting. Do not discontinue therapy if the possibility of infection exists unless a significant adverse reaction has occurred.) Follow up with PCP if symptoms persists Return or go to the ER if you have any new or worsening symptoms
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
## 2018-06-02 DIAGNOSIS — N186 End stage renal disease: Secondary | ICD-10-CM | POA: Diagnosis not present

## 2018-06-02 DIAGNOSIS — R29898 Other symptoms and signs involving the musculoskeletal system: Secondary | ICD-10-CM | POA: Diagnosis not present

## 2018-06-02 DIAGNOSIS — D649 Anemia, unspecified: Secondary | ICD-10-CM | POA: Diagnosis not present

## 2018-06-02 DIAGNOSIS — Z23 Encounter for immunization: Secondary | ICD-10-CM | POA: Diagnosis not present

## 2018-06-02 DIAGNOSIS — T24202A Burn of second degree of unspecified site of left lower limb, except ankle and foot, initial encounter: Secondary | ICD-10-CM | POA: Diagnosis not present

## 2018-06-09 DIAGNOSIS — Z23 Encounter for immunization: Secondary | ICD-10-CM | POA: Diagnosis not present

## 2018-06-09 DIAGNOSIS — D649 Anemia, unspecified: Secondary | ICD-10-CM | POA: Diagnosis not present

## 2018-06-09 DIAGNOSIS — N186 End stage renal disease: Secondary | ICD-10-CM | POA: Diagnosis not present

## 2018-06-16 DIAGNOSIS — N186 End stage renal disease: Secondary | ICD-10-CM | POA: Diagnosis not present

## 2018-06-19 ENCOUNTER — Encounter (HOSPITAL_COMMUNITY): Payer: 59

## 2018-06-19 DIAGNOSIS — M25671 Stiffness of right ankle, not elsewhere classified: Secondary | ICD-10-CM | POA: Diagnosis not present

## 2018-06-19 DIAGNOSIS — Z7409 Other reduced mobility: Secondary | ICD-10-CM | POA: Diagnosis not present

## 2018-06-19 DIAGNOSIS — R29898 Other symptoms and signs involving the musculoskeletal system: Secondary | ICD-10-CM | POA: Diagnosis not present

## 2018-06-21 DIAGNOSIS — E1022 Type 1 diabetes mellitus with diabetic chronic kidney disease: Secondary | ICD-10-CM | POA: Diagnosis not present

## 2018-06-21 DIAGNOSIS — N186 End stage renal disease: Secondary | ICD-10-CM | POA: Diagnosis not present

## 2018-06-21 DIAGNOSIS — Z992 Dependence on renal dialysis: Secondary | ICD-10-CM | POA: Diagnosis not present

## 2018-06-22 DIAGNOSIS — M25671 Stiffness of right ankle, not elsewhere classified: Secondary | ICD-10-CM | POA: Diagnosis not present

## 2018-06-22 DIAGNOSIS — R29898 Other symptoms and signs involving the musculoskeletal system: Secondary | ICD-10-CM | POA: Diagnosis not present

## 2018-06-22 DIAGNOSIS — Z7409 Other reduced mobility: Secondary | ICD-10-CM | POA: Diagnosis not present

## 2018-06-23 DIAGNOSIS — N186 End stage renal disease: Secondary | ICD-10-CM | POA: Diagnosis not present

## 2018-06-29 DIAGNOSIS — Z7409 Other reduced mobility: Secondary | ICD-10-CM | POA: Diagnosis not present

## 2018-06-29 DIAGNOSIS — R29898 Other symptoms and signs involving the musculoskeletal system: Secondary | ICD-10-CM | POA: Diagnosis not present

## 2018-06-29 DIAGNOSIS — M25671 Stiffness of right ankle, not elsewhere classified: Secondary | ICD-10-CM | POA: Diagnosis not present

## 2018-06-30 DIAGNOSIS — E138 Other specified diabetes mellitus with unspecified complications: Secondary | ICD-10-CM | POA: Diagnosis not present

## 2018-06-30 DIAGNOSIS — N186 End stage renal disease: Secondary | ICD-10-CM | POA: Diagnosis not present

## 2018-06-30 DIAGNOSIS — Z23 Encounter for immunization: Secondary | ICD-10-CM | POA: Diagnosis not present

## 2018-07-03 ENCOUNTER — Ambulatory Visit
Admission: RE | Admit: 2018-07-03 | Discharge: 2018-07-03 | Disposition: A | Discharge status: Home / Self Care | Payer: 59 | Source: Ambulatory Visit | Attending: Physician Assistant | Admitting: Physician Assistant

## 2018-07-03 ENCOUNTER — Other Ambulatory Visit: Payer: Self-pay

## 2018-07-03 ENCOUNTER — Ambulatory Visit (HOSPITAL_COMMUNITY)
Admission: RE | Admit: 2018-07-03 | Discharge: 2018-07-03 | Disposition: A | Discharge status: Home / Self Care | Payer: 59 | Source: Ambulatory Visit | Attending: Vascular Surgery | Admitting: Vascular Surgery

## 2018-07-03 ENCOUNTER — Other Ambulatory Visit: Payer: Self-pay | Admitting: Physician Assistant

## 2018-07-03 ENCOUNTER — Ambulatory Visit (INDEPENDENT_AMBULATORY_CARE_PROVIDER_SITE_OTHER): Payer: Self-pay | Admitting: Physician Assistant

## 2018-07-03 ENCOUNTER — Encounter: Payer: Self-pay | Admitting: Physician Assistant

## 2018-07-03 VITALS — BP 124/78 | HR 75 | Temp 98.0°F | Resp 18 | Ht 74.0 in | Wt 189.0 lb

## 2018-07-03 DIAGNOSIS — M25522 Pain in left elbow: Secondary | ICD-10-CM | POA: Diagnosis not present

## 2018-07-03 DIAGNOSIS — N185 Chronic kidney disease, stage 5: Secondary | ICD-10-CM | POA: Diagnosis not present

## 2018-07-03 DIAGNOSIS — Z48812 Encounter for surgical aftercare following surgery on the circulatory system: Secondary | ICD-10-CM | POA: Diagnosis not present

## 2018-07-03 DIAGNOSIS — M7989 Other specified soft tissue disorders: Secondary | ICD-10-CM | POA: Diagnosis not present

## 2018-07-03 DIAGNOSIS — N186 End stage renal disease: Secondary | ICD-10-CM

## 2018-07-03 DIAGNOSIS — Z992 Dependence on renal dialysis: Secondary | ICD-10-CM | POA: Diagnosis not present

## 2018-07-03 DIAGNOSIS — M25529 Pain in unspecified elbow: Secondary | ICD-10-CM | POA: Insufficient documentation

## 2018-07-03 NOTE — Progress Notes (Signed)
Postoperative Access Visit   History of Present Illness   [name] [name] is a 52 y.o. year old male who presents for postoperative follow-up for: left brachiocephalic arteriovenous fistula by Dr. Scot Dock (Date: 05/22/18).  The patient's wounds are healed.  The patient denies steal symptoms.  The patient is able to complete their activities of daily living.  He is currently dialyzing via R IJ TDC without complications.  L AC fossa incision has been well healed for several weeks.  Patient presents today with sudden onset of pain, swelling, and tenderness of his left elbow starting this past Sunday.  He has exquisite tenderness and pain in his joint with active and passive range of motion.  This was evaluated at hemodialysis treatment and patient was sent to the vascular surgery office as it was thought to be related to his left brachiocephalic fistula.  Patient denies any fevers or chills at this time.  He denies any history of pseudogout or gout.  About 30 to 40 years ago patient did fracture his elbow and states after cast was removed the bones never healed properly but he never underwent surgery.  Past medical history significant for insulin-dependent diabetes mellitus however patient states his sugars are under control.  He does not remember his last A1c.  Physical Examination   Vitals:   07/03/18 1348  BP: 124/78  Pulse: 75  Resp: 18  Temp: 98 F (36.7 C)  TempSrc: Oral  SpO2: 99%  Weight: 189 lb (85.7 kg)  Height: 6\' 2"  (1.88 m)   Body mass index is 24.27 kg/m.  left arm Incision is well healed, hand grip is 5/5, sensation in digits is intact,  palpable thrill, bruit can be auscultated; palpable L radial pulse Pitting edema localized to the left elbow, no edema noted in his left forearm or hand; edema of his left elbow is very warm to touch and very sensitive to touch; very limited range of motion actively and passively   Fistula duplex: Patent left brachiocephalic  fistula And distal upper arm to proximal upper arm fistula diameter is around 0.6 cm There is a velocity of 800 and the proximal upper arm with narrowing of the fistula to 0.36 cm Fistula is less than 0.3 cm throughout its length in upper arm   Medical Decision Making   [name] [name] is a 52 y.o. year old male who presents s/p left brachiocephalic arteriovenous fistula who returns to clinic due to pain in his left arm which was thought to be related to AV fistula   Fistula duplex demonstrates a mature fistula with some narrowing in the proximal upper arm; however there is a palpable thrill and audible bruit throughout fistula  Left arm fistula will be ready for use August 31 at the 12 week mark  If patient has difficulty completing hemodialysis treatment with a low flow volumes we will then consider performing a fistulogram and evaluating the narrowing in proximal upper arm  Based on physical exam differential for his elbow pain and local edema includes pseudogout, gout, and septic elbow joint  I explained to the patient that this is not my area of expertise however I would recommend an x-ray of the left elbow and would also recommend drawing a CBC with differential, CRP, and sedimentation rate.  The patient initially did not agree with my assessment and wanted immediate relief of his symptoms.  I then explained to the patient that he can either go directly to the emergency department and  wait to be evaluated or we can proceed with the first option.  He elected to have his outpatient x-ray and labs drawn this afternoon.  I will follow-up with patient with these results   Dagoberto Ligas PA-C [facility] Office: [phone]

## 2018-07-04 LAB — CBC WITH DIFFERENTIAL/PLATELET
Basophils Absolute: 94 cells/uL (ref 0–200)
Basophils Relative: 0.8 %
Eosinophils Absolute: 283 cells/uL (ref 15–500)
Eosinophils Relative: 2.4 %
HCT: 35.3 % — ABNORMAL LOW (ref 38.5–50.0)
Hemoglobin: 11.8 g/dL — ABNORMAL LOW (ref 13.2–17.1)
Lymphs Abs: 2289 cells/uL (ref 850–3900)
MCH: 29.9 pg (ref 27.0–33.0)
MCHC: 33.4 g/dL (ref 32.0–36.0)
MCV: 89.4 fL (ref 80.0–100.0)
MPV: 12.2 fL (ref 7.5–12.5)
Monocytes Relative: 5.7 %
Neutro Abs: 8461 cells/uL — ABNORMAL HIGH (ref 1500–7800)
Neutrophils Relative %: 71.7 %
Platelets: 211 10*3/uL (ref 140–400)
RBC: 3.95 10*6/uL — ABNORMAL LOW (ref 4.20–5.80)
RDW: 13.4 % (ref 11.0–15.0)
Total Lymphocyte: 19.4 %
WBC mixed population: 673 cells/uL (ref 200–950)
WBC: 11.8 10*3/uL — ABNORMAL HIGH (ref 3.8–10.8)

## 2018-07-04 LAB — SEDIMENTATION RATE: Sed Rate: 25 mm/h — ABNORMAL HIGH (ref 0–20)

## 2018-07-04 LAB — C-REACTIVE PROTEIN: CRP: 3.9 mg/L (ref <8.0)

## 2018-07-06 DIAGNOSIS — R29898 Other symptoms and signs involving the musculoskeletal system: Secondary | ICD-10-CM | POA: Diagnosis not present

## 2018-07-06 DIAGNOSIS — Z7409 Other reduced mobility: Secondary | ICD-10-CM | POA: Diagnosis not present

## 2018-07-06 DIAGNOSIS — M25671 Stiffness of right ankle, not elsewhere classified: Secondary | ICD-10-CM | POA: Diagnosis not present

## 2018-07-07 DIAGNOSIS — N2581 Secondary hyperparathyroidism of renal origin: Secondary | ICD-10-CM | POA: Diagnosis not present

## 2018-07-07 DIAGNOSIS — N186 End stage renal disease: Secondary | ICD-10-CM | POA: Diagnosis not present

## 2018-07-09 ENCOUNTER — Encounter (HOSPITAL_COMMUNITY): Payer: 59

## 2018-07-09 ENCOUNTER — Ambulatory Visit (INDEPENDENT_AMBULATORY_CARE_PROVIDER_SITE_OTHER): Payer: 59 | Admitting: Orthopaedic Surgery

## 2018-07-10 ENCOUNTER — Encounter (HOSPITAL_COMMUNITY): Payer: 59

## 2018-07-10 DIAGNOSIS — I872 Venous insufficiency (chronic) (peripheral): Secondary | ICD-10-CM | POA: Diagnosis not present

## 2018-07-14 DIAGNOSIS — D649 Anemia, unspecified: Secondary | ICD-10-CM | POA: Diagnosis not present

## 2018-07-14 DIAGNOSIS — N186 End stage renal disease: Secondary | ICD-10-CM | POA: Diagnosis not present

## 2018-07-14 DIAGNOSIS — N2581 Secondary hyperparathyroidism of renal origin: Secondary | ICD-10-CM | POA: Diagnosis not present

## 2018-07-16 ENCOUNTER — Ambulatory Visit (INDEPENDENT_AMBULATORY_CARE_PROVIDER_SITE_OTHER): Payer: 59 | Admitting: Orthopaedic Surgery

## 2018-07-17 ENCOUNTER — Other Ambulatory Visit: Payer: Self-pay | Admitting: Endocrinology

## 2018-07-17 ENCOUNTER — Other Ambulatory Visit: Payer: Self-pay | Admitting: Sports Medicine

## 2018-07-17 DIAGNOSIS — M545 Low back pain, unspecified: Secondary | ICD-10-CM

## 2018-07-17 NOTE — Telephone Encounter (Signed)
Last ov 03/28/17 4 canceled and no future scheduled refill or refuse please advise

## 2018-07-17 NOTE — Telephone Encounter (Signed)
Refuse

## 2018-07-21 DIAGNOSIS — N2581 Secondary hyperparathyroidism of renal origin: Secondary | ICD-10-CM | POA: Diagnosis not present

## 2018-07-21 DIAGNOSIS — N186 End stage renal disease: Secondary | ICD-10-CM | POA: Diagnosis not present

## 2018-07-22 DIAGNOSIS — Z992 Dependence on renal dialysis: Secondary | ICD-10-CM | POA: Diagnosis not present

## 2018-07-22 DIAGNOSIS — N186 End stage renal disease: Secondary | ICD-10-CM | POA: Diagnosis not present

## 2018-07-22 DIAGNOSIS — E1022 Type 1 diabetes mellitus with diabetic chronic kidney disease: Secondary | ICD-10-CM | POA: Diagnosis not present

## 2018-07-23 DIAGNOSIS — N2581 Secondary hyperparathyroidism of renal origin: Secondary | ICD-10-CM | POA: Diagnosis not present

## 2018-07-23 DIAGNOSIS — Z86010 Personal history of colonic polyps: Secondary | ICD-10-CM | POA: Diagnosis not present

## 2018-07-23 DIAGNOSIS — Z1211 Encounter for screening for malignant neoplasm of colon: Secondary | ICD-10-CM | POA: Diagnosis not present

## 2018-07-23 DIAGNOSIS — N186 End stage renal disease: Secondary | ICD-10-CM | POA: Diagnosis not present

## 2018-07-24 ENCOUNTER — Other Ambulatory Visit: Payer: 59

## 2018-07-28 ENCOUNTER — Inpatient Hospital Stay
Admission: RE | Admit: 2018-07-28 | Discharge: 2018-07-28 | Disposition: A | Discharge status: Home / Self Care | Payer: 59 | Source: Ambulatory Visit | Attending: Sports Medicine | Admitting: Sports Medicine

## 2018-07-28 DIAGNOSIS — N2581 Secondary hyperparathyroidism of renal origin: Secondary | ICD-10-CM | POA: Diagnosis not present

## 2018-07-28 DIAGNOSIS — D649 Anemia, unspecified: Secondary | ICD-10-CM | POA: Diagnosis not present

## 2018-07-28 DIAGNOSIS — N186 End stage renal disease: Secondary | ICD-10-CM | POA: Diagnosis not present

## 2018-08-04 ENCOUNTER — Ambulatory Visit (INDEPENDENT_AMBULATORY_CARE_PROVIDER_SITE_OTHER): Payer: 59 | Admitting: Orthopaedic Surgery

## 2018-08-04 DIAGNOSIS — D649 Anemia, unspecified: Secondary | ICD-10-CM | POA: Diagnosis not present

## 2018-08-04 DIAGNOSIS — N2581 Secondary hyperparathyroidism of renal origin: Secondary | ICD-10-CM | POA: Diagnosis not present

## 2018-08-04 DIAGNOSIS — N186 End stage renal disease: Secondary | ICD-10-CM | POA: Diagnosis not present

## 2018-08-11 DIAGNOSIS — D649 Anemia, unspecified: Secondary | ICD-10-CM | POA: Diagnosis not present

## 2018-08-11 DIAGNOSIS — N186 End stage renal disease: Secondary | ICD-10-CM | POA: Diagnosis not present

## 2018-08-11 DIAGNOSIS — N2581 Secondary hyperparathyroidism of renal origin: Secondary | ICD-10-CM | POA: Diagnosis not present

## 2018-08-18 DIAGNOSIS — N186 End stage renal disease: Secondary | ICD-10-CM | POA: Diagnosis not present

## 2018-08-18 DIAGNOSIS — N2581 Secondary hyperparathyroidism of renal origin: Secondary | ICD-10-CM | POA: Diagnosis not present

## 2018-08-18 DIAGNOSIS — D649 Anemia, unspecified: Secondary | ICD-10-CM | POA: Diagnosis not present

## 2018-08-21 DIAGNOSIS — D122 Benign neoplasm of ascending colon: Secondary | ICD-10-CM | POA: Diagnosis not present

## 2018-08-21 DIAGNOSIS — K635 Polyp of colon: Secondary | ICD-10-CM | POA: Diagnosis not present

## 2018-08-21 DIAGNOSIS — K210 Gastro-esophageal reflux disease with esophagitis: Secondary | ICD-10-CM | POA: Diagnosis not present

## 2018-08-21 DIAGNOSIS — D120 Benign neoplasm of cecum: Secondary | ICD-10-CM | POA: Diagnosis not present

## 2018-08-21 DIAGNOSIS — Z1211 Encounter for screening for malignant neoplasm of colon: Secondary | ICD-10-CM | POA: Diagnosis not present

## 2018-08-22 DIAGNOSIS — N186 End stage renal disease: Secondary | ICD-10-CM | POA: Diagnosis not present

## 2018-08-22 DIAGNOSIS — E1022 Type 1 diabetes mellitus with diabetic chronic kidney disease: Secondary | ICD-10-CM | POA: Diagnosis not present

## 2018-08-22 DIAGNOSIS — Z992 Dependence on renal dialysis: Secondary | ICD-10-CM | POA: Diagnosis not present

## 2018-08-25 DIAGNOSIS — N186 End stage renal disease: Secondary | ICD-10-CM | POA: Diagnosis not present

## 2018-08-25 DIAGNOSIS — D649 Anemia, unspecified: Secondary | ICD-10-CM | POA: Diagnosis not present

## 2018-08-25 DIAGNOSIS — N2581 Secondary hyperparathyroidism of renal origin: Secondary | ICD-10-CM | POA: Diagnosis not present

## 2018-09-01 DIAGNOSIS — D509 Iron deficiency anemia, unspecified: Secondary | ICD-10-CM | POA: Diagnosis not present

## 2018-09-01 DIAGNOSIS — N2581 Secondary hyperparathyroidism of renal origin: Secondary | ICD-10-CM | POA: Diagnosis not present

## 2018-09-01 DIAGNOSIS — N186 End stage renal disease: Secondary | ICD-10-CM | POA: Diagnosis not present

## 2018-09-08 ENCOUNTER — Other Ambulatory Visit (HOSPITAL_COMMUNITY): Payer: Self-pay | Admitting: Nephrology

## 2018-09-08 DIAGNOSIS — Z23 Encounter for immunization: Secondary | ICD-10-CM | POA: Diagnosis not present

## 2018-09-08 DIAGNOSIS — N186 End stage renal disease: Secondary | ICD-10-CM

## 2018-09-08 DIAGNOSIS — N2581 Secondary hyperparathyroidism of renal origin: Secondary | ICD-10-CM | POA: Diagnosis not present

## 2018-09-09 ENCOUNTER — Other Ambulatory Visit: Payer: Self-pay | Admitting: Radiology

## 2018-09-10 ENCOUNTER — Ambulatory Visit (HOSPITAL_COMMUNITY)
Admission: RE | Admit: 2018-09-10 | Discharge: 2018-09-10 | Disposition: A | Discharge status: Home / Self Care | Payer: 59 | Source: Ambulatory Visit | Attending: Nephrology | Admitting: Nephrology

## 2018-09-10 ENCOUNTER — Encounter (HOSPITAL_COMMUNITY): Payer: Self-pay

## 2018-09-10 ENCOUNTER — Other Ambulatory Visit (HOSPITAL_COMMUNITY): Payer: Self-pay | Admitting: Nephrology

## 2018-09-10 DIAGNOSIS — Y832 Surgical operation with anastomosis, bypass or graft as the cause of abnormal reaction of the patient, or of later complication, without mention of misadventure at the time of the procedure: Secondary | ICD-10-CM | POA: Insufficient documentation

## 2018-09-10 DIAGNOSIS — Z794 Long term (current) use of insulin: Secondary | ICD-10-CM | POA: Diagnosis not present

## 2018-09-10 DIAGNOSIS — N186 End stage renal disease: Secondary | ICD-10-CM | POA: Diagnosis not present

## 2018-09-10 DIAGNOSIS — T82858A Stenosis of vascular prosthetic devices, implants and grafts, initial encounter: Secondary | ICD-10-CM | POA: Diagnosis not present

## 2018-09-10 DIAGNOSIS — T82868A Thrombosis of vascular prosthetic devices, implants and grafts, initial encounter: Secondary | ICD-10-CM | POA: Insufficient documentation

## 2018-09-10 DIAGNOSIS — Z7982 Long term (current) use of aspirin: Secondary | ICD-10-CM | POA: Insufficient documentation

## 2018-09-10 DIAGNOSIS — E1122 Type 2 diabetes mellitus with diabetic chronic kidney disease: Secondary | ICD-10-CM | POA: Insufficient documentation

## 2018-09-10 DIAGNOSIS — Z880 Allergy status to penicillin: Secondary | ICD-10-CM | POA: Insufficient documentation

## 2018-09-10 DIAGNOSIS — I120 Hypertensive chronic kidney disease with stage 5 chronic kidney disease or end stage renal disease: Secondary | ICD-10-CM | POA: Diagnosis not present

## 2018-09-10 HISTORY — PX: IR THROMBECTOMY AV FISTULA W/THROMBOLYSIS INC/SHUNT/IMG LEFT: IMG6105

## 2018-09-10 HISTORY — PX: IR US GUIDE VASC ACCESS LEFT: IMG2389

## 2018-09-10 LAB — BASIC METABOLIC PANEL
Anion gap: 10 (ref 5–15)
BUN: 27 mg/dL — ABNORMAL HIGH (ref 6–20)
CO2: 27 mmol/L (ref 22–32)
Calcium: 9.4 mg/dL (ref 8.9–10.3)
Chloride: 101 mmol/L (ref 98–111)
Creatinine, Ser: 3.8 mg/dL — ABNORMAL HIGH (ref 0.61–1.24)
GFR calc Af Amer: 20 mL/min — ABNORMAL LOW (ref >60)
GFR calc non Af Amer: 17 mL/min — ABNORMAL LOW (ref >60)
Glucose, Bld: 89 mg/dL (ref 70–99)
Potassium: 3.9 mmol/L (ref 3.5–5.1)
Sodium: 138 mmol/L (ref 135–145)

## 2018-09-10 LAB — CBC
HCT: 33.9 % — ABNORMAL LOW (ref 39.0–52.0)
Hemoglobin: 11.1 g/dL — ABNORMAL LOW (ref 13.0–17.0)
MCH: 30.1 pg (ref 26.0–34.0)
MCHC: 32.7 g/dL (ref 30.0–36.0)
MCV: 91.9 fL (ref 78.0–100.0)
Platelets: 208 10*3/uL (ref 150–400)
RBC: 3.69 MIL/uL — ABNORMAL LOW (ref 4.22–5.81)
RDW: 13.7 % (ref 11.5–15.5)
WBC: 8.6 10*3/uL (ref 4.0–10.5)

## 2018-09-10 LAB — GLUCOSE, CAPILLARY: Glucose-Capillary: 82 mg/dL (ref 70–99)

## 2018-09-10 LAB — PROTIME-INR
INR: 1.06
Prothrombin Time: 13.7 seconds (ref 11.4–15.2)

## 2018-09-10 MED ORDER — ALTEPLASE 2 MG IJ SOLR
INTRAMUSCULAR | Status: AC | PRN
  Administered 2018-09-10: 2 mg

## 2018-09-10 MED ORDER — FENTANYL CITRATE (PF) 100 MCG/2ML IJ SOLN
INTRAMUSCULAR | Status: AC
  Filled 2018-09-10: qty 2

## 2018-09-10 MED ORDER — LIDOCAINE HCL 1 % IJ SOLN
INTRAMUSCULAR | Status: AC
  Filled 2018-09-10: qty 20

## 2018-09-10 MED ORDER — MIDAZOLAM HCL 2 MG/2ML IJ SOLN
INTRAMUSCULAR | Status: AC
  Filled 2018-09-10: qty 2

## 2018-09-10 MED ORDER — LIDOCAINE HCL (PF) 1 % IJ SOLN
INTRAMUSCULAR | Status: AC | PRN
  Administered 2018-09-10: 5 mL

## 2018-09-10 MED ORDER — HEPARIN SODIUM (PORCINE) 1000 UNIT/ML IJ SOLN
INTRAMUSCULAR | Status: AC
  Filled 2018-09-10: qty 1

## 2018-09-10 MED ORDER — SODIUM CHLORIDE 0.9 % IV SOLN: INTRAVENOUS | Status: DC

## 2018-09-10 MED ORDER — IOPAMIDOL (ISOVUE-300) INJECTION 61%
INTRAVENOUS | Status: AC
  Filled 2018-09-10: qty 50

## 2018-09-10 MED ORDER — ALTEPLASE 2 MG IJ SOLR
INTRAMUSCULAR | Status: AC
  Filled 2018-09-10: qty 2

## 2018-09-10 MED ORDER — IOPAMIDOL (ISOVUE-300) INJECTION 61%
INTRAVENOUS | Status: AC
  Administered 2018-09-10: 20 mL
  Filled 2018-09-10: qty 50

## 2018-09-10 NOTE — Sedation Documentation (Signed)
Procedure complete. No medication given. VSS.

## 2018-09-10 NOTE — H&P (Signed)
Chief Complaint: Patient was seen in consultation today for left upper arm dialysis fistula declot at the request of [place]  Referring Physician(s): [place]  Supervising Physician: Aletta Edouard  Patient Status: [facility] - Out-pt  History of Present Illness: [name] [name] is a 52 y.o. male   DM; ESRD LUE fistula placed 5/31 - Dr Scot Dock Tunneled dialysis catheter Rt IJ intact-- functioning well  Unable to cannulate fistula x 3 attempts (within this last week) at [facility] Clotted access Request made for thrombolysis with possible angioplasty/stent placement  Pt requesting NO sedation--- to work this evening  Past Medical History:  Diagnosis Date  . Bacteremia   . Diabetes mellitus   . Hypertension   . Pneumonia 02/2012   Strep pneumoniae bilateral pneumonia complicated by bacteremia  . Renal disorder    End stage, not on dialysis    Past Surgical History:  Procedure Laterality Date  . AV FISTULA PLACEMENT Left 05/22/2018   Procedure: Creation of BrachiaCephalic Fistula Left Arm;  Surgeon: Angelia Mould, MD;  Location: [place];  Service: Vascular;  Laterality: Left;  . CHEST TUBE INSERTION     placed during hospitalization 02/2012  . COLONOSCOPY  04/01/2012   Procedure: COLONOSCOPY;  Surgeon: Juanita Craver, MD;  Location: [facility] ENDOSCOPY;  Service: Endoscopy;  Laterality: N/A;  . ESOPHAGOGASTRODUODENOSCOPY  03/31/2012   Procedure: ESOPHAGOGASTRODUODENOSCOPY (EGD);  Surgeon: Beryle Beams, MD;  Location: [facility] ENDOSCOPY;  Service: Endoscopy;  Laterality: N/A;  . EXCHANGE OF A DIALYSIS CATHETER Right 05/22/2018   Procedure: EXCHANGE OF A DIALYSIS CATHETER;  Surgeon: Angelia Mould, MD;  Location: [place];  Service: Vascular;  Laterality: Right;  . IR FLUORO GUIDE CV LINE RIGHT  05/20/2018  . IR US GUIDE VASC ACCESS RIGHT  05/20/2018    Allergies: Penicillins  Medications: Prior to Admission medications   Medication Sig Start Date End Date  Taking? Authorizing Provider  amLODipine (NORVASC) 10 MG tablet Take 10 mg by mouth daily.  03/21/17  Yes [provider]  aspirin 81 MG tablet Take 81 mg by mouth every morning.    Yes [provider]  b complex-vitamin c-folic acid (NEPHRO-VITE) 0.8 MG TABS tablet Take 1 tablet by mouth daily.   Yes [provider]  carvedilol (COREG) 25 MG tablet Take 1.5 tablets (37.5 mg total) by mouth 2 (two) times daily with a meal. 03/25/17  Yes Troy Sine, MD  insulin aspart (NOVOLOG FLEXPEN) 100 UNIT/ML FlexPen 5-15 units before meals Patient taking differently: Inject 1-11 Units into the skin 3 (three) times daily with meals. Per sliding scale 04/08/17  Yes Elayne Snare, MD  lanthanum (FOSRENOL) 1000 MG chewable tablet Chew 1 tablet (1,000 mg total) by mouth 3 (three) times daily with meals. Patient taking differently: Chew 2,000 mg by mouth 3 (three) times daily with meals.  05/25/18  Yes Domenic Polite, MD  omeprazole (PRILOSEC) 40 MG capsule Take 40 mg by mouth daily.   Yes [provider]  TRESIBA FLEXTOUCH 100 UNIT/ML SOPN FlexTouch Pen INJECT 0.38 MLS (38 UNITS TOTAL) INTO THE SKIN DAILY AT 10 PM. Patient taking differently: Inject 38 Units into the skin at bedtime.  05/16/17  Yes Elayne Snare, MD  Continuous Blood Gluc Receiver (FREESTYLE LIBRE READER) DEVI 1 Device by Does not apply route as directed. Patient not taking: Reported on 09/09/2018 03/31/17   Elayne Snare, MD  Continuous Blood Gluc Sensor ([place]) MISC Apply sensor to upper arm and change sensor every  10 days. Patient not taking: Reported on 09/09/2018 03/26/18   Elayne Snare, MD  Insulin Pen Needle 31G X 5 MM MISC Use to inject Tyler Aas once daily Patient not taking: Reported on 09/09/2018 03/28/17   Elayne Snare, MD  silver sulfADIAZINE (SILVADENE) 1 % cream Apply 1 application topically 2 (two) times daily. Apply to left posterior calf thermal injury (partial thickness) skin injury in a  1/8 inch layer.  Top with NS moistened gauze 4x4, follow with dry gauze 4x4 and secure with conform bandaging/paper tape. Patient not taking: Reported on 09/09/2018 05/25/18   Domenic Polite, MD     Family History  Problem Relation Age of Onset  . Diabetes Maternal Grandmother     Social History   Socioeconomic History  . Marital status: Divorced    Spouse name: Not on file  . Number of children: Not on file  . Years of education: Not on file  . Highest education level: Not on file  Occupational History  . Occupation: Information systems manager: [employer]  Social Needs  . Financial resource strain: Not on file  . Food insecurity:    Worry: Not on file    Inability: Not on file  . Transportation needs:    Medical: Not on file    Non-medical: Not on file  Tobacco Use  . Smoking status: Never Smoker  . Smokeless tobacco: Never Used  Substance and Sexual Activity  . Alcohol use: Yes    Comment: rare   . Drug use: No  . Sexual activity: Not on file  Lifestyle  . Physical activity:    Days per week: Not on file    Minutes per session: Not on file  . Stress: Not on file  Relationships  . Social connections:    Talks on phone: Not on file    Gets together: Not on file    Attends religious service: Not on file    Active member of club or organization: Not on file    Attends meetings of clubs or organizations: Not on file    Relationship status: Not on file  Other Topics Concern  . Not on file  Social History Narrative   He is from [place] and lives at home here with his daughter. He is divorced. 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     Review of Systems: A 12 point ROS discussed and pertinent positives are indicated in the HPI above.  All other systems are negative.  Review of Systems  Constitutional: Negative for activity change, fatigue and fever.  Respiratory: Negative for cough and shortness of breath.     Cardiovascular: Negative for chest pain.  Gastrointestinal: Negative for abdominal pain.  Musculoskeletal: Negative for back pain and gait problem.  Neurological: Negative for weakness.  Psychiatric/Behavioral: Negative for behavioral problems and confusion.    Vital Signs: BP (!) 149/92   Pulse 73   Temp 98.2 F (36.8 C)   Resp 20   Ht 6\' 2"  (1.88 m)   Wt 190 lb (86.2 kg)   SpO2 97%   BMI 24.39 kg/m   Physical Exam  Constitutional: He is oriented to person, place, and time.  Cardiovascular: Normal rate, regular rhythm and normal heart sounds.  Pulmonary/Chest: Effort normal and breath sounds normal.  Abdominal: Soft. Bowel sounds are normal.  Musculoskeletal: Normal range of motion.  LUE dialysis access-- no thrill - no pulse  Neurological: He is  alert and oriented to person, place, and time.  Skin: Skin is warm and dry.  Psychiatric: He has a normal mood and affect. His behavior is normal. Judgment and thought content normal.  Nursing note and vitals reviewed.   Imaging: No results found.  Labs:  CBC: Recent Labs    05/22/18 0330 05/24/18 0316 07/03/18 1526 09/10/18 1214  WBC 7.9 10.7* 11.8* 8.6  HGB 9.0* 9.3* 11.8* 11.1*  HCT 27.9* 29.7* 35.3* 33.9*  PLT 203 269 211 208    COAGS: Recent Labs    09/10/18 1214  INR 1.06    BMP: Recent Labs    05/21/18 1012 05/22/18 0330 05/24/18 0316 05/25/18 0419  NA 140 138 136 137  K 3.4* 3.6 4.3 4.1  CL 100* 101 97* 102  CO2 28 29 28 26   GLUCOSE 152* 186* 246* 163*  BUN 90* 69* 77* 92*  CALCIUM 7.0* 6.9* 7.7* 7.5*  CREATININE 6.75* 5.86* 7.18* 7.82*  GFRNONAA 8* 10* 8* 7*  GFRAA 10* 12* 9* 8*    LIVER FUNCTION TESTS: Recent Labs    03/02/18 1820  05/20/18 0415 05/20/18 1950 05/21/18 1012 05/25/18 0419  BILITOT 0.9  --   --   --   --   --   AST 20  --   --   --   --   --   ALT 20  --   --   --   --   --   ALKPHOS 78  --   --   --   --   --   PROT 6.8  --   --   --   --   --   ALBUMIN 3.2*    < > 3.0* 2.9* 2.4* 2.4*   < > = values in this interval not displayed.    TUMOR MARKERS: No results for input(s): AFPTM, CEA, CA199, CHROMGRNA in the last 8760 hours.  Assessment and Plan:  ESRD LUE dialysis fistula placed 05/22/18 Just attempted to use this last week-- clotted Has existing Rt IJ tunneled HD catheter--- functioning well Scheduled for left arm dialysis fistula thrombolysis with possible angioplasty/stent placement Risks and benefits discussed with the patient including, but not limited to bleeding, infection, vascular injury, pulmonary embolism, need for tunneled HD catheter placement or even death.  All of the patient's questions were answered, patient is agreeable to proceed. Consent signed and in chart.    Thank you for this interesting consult.  I greatly enjoyed meeting [name] [name] and look forward to participating in their care.  A copy of this report was sent to the requesting provider on this date.  Electronically Signed: Lavonia Drafts, PA-C 09/10/2018, 12:41 PM   I spent a total of  30 Minutes   in face to face in clinical consultation, greater than 50% of which was counseling/coordinating care for LUE dialysis fistula declot

## 2018-09-10 NOTE — Discharge Instructions (Signed)
Vascular Access for Hemodialysis A vascular access is a connection between two blood vessels that allows blood to be easily removed from the body and returned to the body during hemodialysis. Hemodialysis is a procedure in which a machine outside of the body filters the blood. There are three types of vascular accesses:  Arteriovenous fistula. This is a connection between an artery and a vein (usually in the arm) that is made by sewing them together. Blood in the artery flows directly into the vein, causing it to get larger over time. This makes it easier for the vein to be used for hemodialysis. An arteriovenous fistula takes 1-6 months to develop after surgery.  Arteriovenous graft. This is a connection between an artery and a vein in the arm that is made with a tube. An arteriovenous graft can be used within 2-3 weeks of surgery.  Venous catheter. This is a thin, flexible tube that is placed in a large vein (usually in the neck, chest, or groin). A venous catheter for hemodialysis contains two tubes that come out of the skin. A venous catheter can be used right away. It is usually used as a temporary access if you need hemodialysis before a fistula or graft has developed. It may also be used as a permanent access if a fistula or graft cannot be created.  Which type of access is best for me? The type of access that is best for you depends on the size and strength of your veins. A fistula is usually the preferred type of access. It can last several years and is less likely than the other types of accesses to become infected or to cause blood clots within a blood vessel (thrombosis). However, a fistula is not an option for everyone. If your veins are not the right size, a graft may be used instead. Grafts require you to have strong veins. If your veins are not strong enough for a graft, a catheter may be used. Catheters are more likely than fistulas and grafts to become infected or to have  thrombosis. Sometimes, only one type of access is an option. Your health care provider will help you determine which type of access is best for you. How is a vascular access used? The way the access is used depends on the type of access:  If the access is a fistula or graft, two needles are inserted through the skin into the access before each hemodialysis session. Blood leaves the body through one of the needles and travels through a tube to the hemodialysis machine (dialyzer). It then flows through another tube and returns to the body through the second needle.  If the access is a catheter, one tube is connected directly to the tube that leads to the dialyzer and the other is connected to a tube that leads away from the dialyzer. Blood leaves the body through one tube and returns to the body through the other.  What kind of problems can occur with vascular accesses?  Blood clots within a blood vessel (thrombosis). Thrombosis can lead to a narrowing of a blood vessel or tube (stenosis). If thrombosis occurs frequently, another access site may be created as a backup.  Infection. These problems are most likely to occur with a venous catheter and least likely to occur with an arteriovenous fistula. How do I care for my vascular access? Wear a medical alert bracelet. This tells health care providers that you are a dialysis patient in the case of an emergency and   allows them to care for your veins appropriately. If you have a graft or fistula:  A "bruit" is a noise that is heard with a stethoscope and a "thrill" is a vibration felt over the graft or fistula. The presence of the bruit and thrill indicates that the access is working. You will be taught to feel for the thrill each day. If this is not felt, the access may be clotted. Call your health care provider.  You may use the arm where your vascular access is located freely after the site heals. Keep the following in mind: ? Avoid pressure on the  arm. ? Avoid lifting heavy objects with the arm. ? Avoid sleeping on the arm. ? Avoid wearing tight-sleeved shirts or jewelry around the graft or fistula.  Do not allow blood pressure monitoring or needle punctures on the side where the graft or fistula is located.  With permission from your health care provider, you may do exercises to help with blood flow through a fistula. These exercises involve squeezing a rubber ball or other soft objects as instructed.  Contact a health care provider if:  Chills develop.  You have an oral temperature above 102 F (38.9 C).  Swelling around the graft or fistula gets worse.  New pain develops.  Pus or other fluid (drainage) is seen at the vascular access site.  Skin redness or red streaking is seen on the skin around, above, or below the vascular access. Get help right away if:  Pain, numbness, or an unusual pale skin color develops in the hand on the side of your fistula.  Dizziness or weakness develops that you have not had before.  The vascular access has bleeding that cannot be easily controlled. This information is not intended to replace advice given to you by your health care provider. Make sure you discuss any questions you have with your health care provider. Document Released: 03/01/2003 Document Revised: 05/16/2016 Document Reviewed: 04/27/2013 Elsevier Interactive Patient Education  2017 [employer]  

## 2018-09-10 NOTE — Sedation Documentation (Signed)
Dr Kathlene Cote arrived in IR suite

## 2018-09-10 NOTE — Procedures (Signed)
Interventional Radiology Procedure Note  Procedure: Aborted thrombectomy of left brachiocephalic AV fistula  Complications: None  Estimated Blood Loss: < 10 mL  Findings: Small cephalic vein becomes completely occluded in mid upper left arm with chronic occlusion present.  Unable to cross with guidewire. Cephalic collaterals reconstitute deep veins in upper arm.  Further attempt at declot aborted.  Venetia Night. Kathlene Cote, M.D Pager:  [phone]

## 2018-09-10 NOTE — Progress Notes (Signed)
Patient in IR suite being prepped for procedure. Patient is connected to monitor and VS being taken. Patient currently wants no narcotics or sedation for procedure but this RN is in room monitoring patient for procedure and able to give medication at patients request.

## 2018-09-10 NOTE — Progress Notes (Signed)
Patient VSS. Patient D/C to home with instructions given.

## 2018-09-11 ENCOUNTER — Encounter (HOSPITAL_COMMUNITY): Payer: Self-pay

## 2018-09-11 ENCOUNTER — Other Ambulatory Visit (HOSPITAL_COMMUNITY): Payer: Self-pay | Admitting: Nephrology

## 2018-09-11 DIAGNOSIS — N186 End stage renal disease: Secondary | ICD-10-CM

## 2018-09-15 DIAGNOSIS — N186 End stage renal disease: Secondary | ICD-10-CM | POA: Diagnosis not present

## 2018-09-15 DIAGNOSIS — N2581 Secondary hyperparathyroidism of renal origin: Secondary | ICD-10-CM | POA: Diagnosis not present

## 2018-09-17 ENCOUNTER — Other Ambulatory Visit: Payer: Self-pay

## 2018-09-17 DIAGNOSIS — N186 End stage renal disease: Secondary | ICD-10-CM

## 2018-09-21 DIAGNOSIS — E1022 Type 1 diabetes mellitus with diabetic chronic kidney disease: Secondary | ICD-10-CM | POA: Diagnosis not present

## 2018-09-21 DIAGNOSIS — N186 End stage renal disease: Secondary | ICD-10-CM | POA: Diagnosis not present

## 2018-09-21 DIAGNOSIS — Z992 Dependence on renal dialysis: Secondary | ICD-10-CM | POA: Diagnosis not present

## 2018-09-22 DIAGNOSIS — N186 End stage renal disease: Secondary | ICD-10-CM | POA: Diagnosis not present

## 2018-09-22 DIAGNOSIS — D649 Anemia, unspecified: Secondary | ICD-10-CM | POA: Diagnosis not present

## 2018-09-22 DIAGNOSIS — N2581 Secondary hyperparathyroidism of renal origin: Secondary | ICD-10-CM | POA: Diagnosis not present

## 2018-09-29 DIAGNOSIS — N186 End stage renal disease: Secondary | ICD-10-CM | POA: Diagnosis not present

## 2018-09-29 DIAGNOSIS — N2581 Secondary hyperparathyroidism of renal origin: Secondary | ICD-10-CM | POA: Diagnosis not present

## 2018-10-01 DIAGNOSIS — N186 End stage renal disease: Secondary | ICD-10-CM | POA: Diagnosis not present

## 2018-10-01 DIAGNOSIS — D509 Iron deficiency anemia, unspecified: Secondary | ICD-10-CM | POA: Diagnosis not present

## 2018-10-01 DIAGNOSIS — N2581 Secondary hyperparathyroidism of renal origin: Secondary | ICD-10-CM | POA: Diagnosis not present

## 2018-10-03 DIAGNOSIS — N2581 Secondary hyperparathyroidism of renal origin: Secondary | ICD-10-CM | POA: Diagnosis not present

## 2018-10-03 DIAGNOSIS — N186 End stage renal disease: Secondary | ICD-10-CM | POA: Diagnosis not present

## 2018-10-03 DIAGNOSIS — D509 Iron deficiency anemia, unspecified: Secondary | ICD-10-CM | POA: Diagnosis not present

## 2018-10-06 DIAGNOSIS — D509 Iron deficiency anemia, unspecified: Secondary | ICD-10-CM | POA: Diagnosis not present

## 2018-10-06 DIAGNOSIS — N186 End stage renal disease: Secondary | ICD-10-CM | POA: Diagnosis not present

## 2018-10-06 DIAGNOSIS — N2581 Secondary hyperparathyroidism of renal origin: Secondary | ICD-10-CM | POA: Diagnosis not present

## 2018-10-13 DIAGNOSIS — E875 Hyperkalemia: Secondary | ICD-10-CM | POA: Diagnosis not present

## 2018-10-13 DIAGNOSIS — N186 End stage renal disease: Secondary | ICD-10-CM | POA: Diagnosis not present

## 2018-10-13 DIAGNOSIS — N2581 Secondary hyperparathyroidism of renal origin: Secondary | ICD-10-CM | POA: Diagnosis not present

## 2018-10-20 DIAGNOSIS — N2581 Secondary hyperparathyroidism of renal origin: Secondary | ICD-10-CM | POA: Diagnosis not present

## 2018-10-20 DIAGNOSIS — D649 Anemia, unspecified: Secondary | ICD-10-CM | POA: Diagnosis not present

## 2018-10-20 DIAGNOSIS — N186 End stage renal disease: Secondary | ICD-10-CM | POA: Diagnosis not present

## 2018-10-22 DIAGNOSIS — Z992 Dependence on renal dialysis: Secondary | ICD-10-CM | POA: Diagnosis not present

## 2018-10-22 DIAGNOSIS — E1022 Type 1 diabetes mellitus with diabetic chronic kidney disease: Secondary | ICD-10-CM | POA: Diagnosis not present

## 2018-10-22 DIAGNOSIS — N186 End stage renal disease: Secondary | ICD-10-CM | POA: Diagnosis not present

## 2018-10-24 DIAGNOSIS — D689 Coagulation defect, unspecified: Secondary | ICD-10-CM | POA: Diagnosis not present

## 2018-10-24 DIAGNOSIS — N186 End stage renal disease: Secondary | ICD-10-CM | POA: Diagnosis not present

## 2018-10-24 DIAGNOSIS — N2581 Secondary hyperparathyroidism of renal origin: Secondary | ICD-10-CM | POA: Diagnosis not present

## 2018-10-26 ENCOUNTER — Ambulatory Visit (INDEPENDENT_AMBULATORY_CARE_PROVIDER_SITE_OTHER): Payer: 59 | Admitting: Surgery

## 2018-10-26 ENCOUNTER — Ambulatory Visit (INDEPENDENT_AMBULATORY_CARE_PROVIDER_SITE_OTHER)
Admission: RE | Admit: 2018-10-26 | Discharge: 2018-10-26 | Disposition: A | Discharge status: Home / Self Care | Payer: 59 | Source: Ambulatory Visit | Attending: Surgery | Admitting: Surgery

## 2018-10-26 ENCOUNTER — Encounter: Payer: Self-pay | Admitting: *Deleted

## 2018-10-26 ENCOUNTER — Ambulatory Visit (HOSPITAL_COMMUNITY)
Admission: RE | Admit: 2018-10-26 | Discharge: 2018-10-26 | Disposition: A | Discharge status: Home / Self Care | Payer: 59 | Source: Ambulatory Visit | Attending: Surgery | Admitting: Surgery

## 2018-10-26 ENCOUNTER — Other Ambulatory Visit: Payer: Self-pay | Admitting: *Deleted

## 2018-10-26 ENCOUNTER — Other Ambulatory Visit: Payer: Self-pay

## 2018-10-26 ENCOUNTER — Encounter: Payer: Self-pay | Admitting: Surgery

## 2018-10-26 VITALS — BP 136/80 | HR 75 | Temp 98.1°F | Resp 16 | Ht 74.0 in | Wt 196.0 lb

## 2018-10-26 DIAGNOSIS — N186 End stage renal disease: Secondary | ICD-10-CM

## 2018-10-26 DIAGNOSIS — Z992 Dependence on renal dialysis: Secondary | ICD-10-CM | POA: Diagnosis not present

## 2018-10-26 NOTE — Progress Notes (Signed)
[facility]  Patient name: [name] [name] MRN: [id number] DOB: 28-Mar-1966 Sex: male   REQUESTING PROVIDER:    Dr. Lorrene Reid   REASON FOR CONSULT:    Dialysis access  HISTORY OF PRESENT ILLNESS:   [name] [name] is a 52 y.o. male, who is status post left brachiocephalic fistula by Dr. Scot Dock on 05/22/2018.  This has occluded.  He comes in today for new access.  His renal failure secondary to diabetes.  He is medically managed for hypertension.  He is on the kidney pancreas transplant list.  He currently has a catheter in place and is on dialysis Tuesday Thursday Saturday.  He continues to work.  PAST MEDICAL HISTORY    Past Medical History:  Diagnosis Date  . Bacteremia   . Diabetes mellitus   . Hypertension   . Pneumonia 02/2012   Strep pneumoniae bilateral pneumonia complicated by bacteremia  . Renal disorder    End stage, not on dialysis     FAMILY HISTORY   Family History  Problem Relation Age of Onset  . Diabetes Maternal Grandmother     SOCIAL HISTORY:   Social History   Socioeconomic History  . Marital status: Divorced    Spouse name: Not on file  . Number of children: Not on file  . Years of education: Not on file  . Highest education level: Not on file  Occupational History  . Occupation: Information systems manager: [employer]  Social Needs  . Financial resource strain: Not on file  . Food insecurity:    Worry: Not on file    Inability: Not on file  . Transportation needs:    Medical: Not on file    Non-medical: Not on file  Tobacco Use  . Smoking status: Never Smoker  . Smokeless tobacco: Never Used  Substance and Sexual Activity  . Alcohol use: Yes    Comment: rare   . Drug use: No  . Sexual activity: Not on file  Lifestyle  . Physical activity:    Days per week: Not on file    Minutes per session: Not on file  . Stress: Not on file  Relationships  .  Social connections:    Talks on phone: Not on file    Gets together: Not on file    Attends religious service: Not on file    Active member of club or organization: Not on file    Attends meetings of clubs or organizations: Not on file    Relationship status: Not on file  . Intimate partner violence:    Fear of current or ex partner: Not on file    Emotionally abused: Not on file    Physically abused: Not on file    Forced sexual activity: Not on file  Other Topics Concern  . Not on file  Social History Narrative   He is from [place] and lives at home here with his daughter. He is divorced. 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     ALLERGIES:    Allergies  Allergen Reactions  . Penicillins Rash and Other (See Comments)    Tolerating Ceftriaxone (03/12/12) Has patient had a PCN reaction causing immediate rash, facial/tongue/throat swelling, SOB or lightheadedness with hypotension: Yes Has patient had a PCN reaction causing severe rash involving mucus membranes or skin necrosis: Unk Has patient had a PCN reaction that required hospitalization: No Has patient  had a PCN reaction occurring within the last 10 years: No If all of the above answers are "NO", then may proceed with Cephalosporin use.     CURRENT MEDICATIONS:    Current Outpatient Medications  Medication Sig Dispense Refill  . amLODipine (NORVASC) 10 MG tablet Take 10 mg by mouth daily.     Marland Kitchen aspirin 81 MG tablet Take 81 mg by mouth every morning.     Marland Kitchen b complex-vitamin c-folic acid (NEPHRO-VITE) 0.8 MG TABS tablet Take 1 tablet by mouth daily.    . carvedilol (COREG) 25 MG tablet Take 1.5 tablets (37.5 mg total) by mouth 2 (two) times daily with a meal. 90 tablet 11  . Continuous Blood Gluc Receiver (FREESTYLE LIBRE READER) DEVI 1 Device by Does not apply route as directed. 1 Device 0  . Continuous Blood Gluc Sensor (FREESTYLE LIBRE SENSOR SYSTEM) MISC Apply sensor to upper  arm and change sensor every 10 days. 3 each 3  . insulin aspart (NOVOLOG FLEXPEN) 100 UNIT/ML FlexPen 5-15 units before meals (Patient taking differently: Inject 1-11 Units into the skin 3 (three) times daily with meals. Per sliding scale) 45 mL 1  . Insulin Pen Needle 31G X 5 MM MISC Use to inject [place] once daily 30 each 4  . lanthanum (FOSRENOL) 1000 MG chewable tablet Chew 1 tablet (1,000 mg total) by mouth 3 (three) times daily with meals. (Patient taking differently: Chew 2,000 mg by mouth 3 (three) times daily with meals. ) 90 tablet 0  . silver sulfADIAZINE (SILVADENE) 1 % cream Apply 1 application topically 2 (two) times daily. Apply to left posterior calf thermal injury (partial thickness) skin injury in a 1/8 inch layer.  Top with NS moistened gauze 4x4, follow with dry gauze 4x4 and secure with conform bandaging/paper tape. 50 g 0  . TRESIBA FLEXTOUCH 100 UNIT/ML SOPN FlexTouch Pen INJECT 0.38 MLS (38 UNITS TOTAL) INTO THE SKIN DAILY AT 10 PM. (Patient taking differently: Inject 38 Units into the skin at bedtime. ) 12 pen 2   No current facility-administered medications for this visit.     REVIEW OF SYSTEMS:   [X]  denotes positive finding, [ ]  denotes negative finding Cardiac  Comments:  Chest pain or chest pressure:    Shortness of breath upon exertion:    Short of breath when lying flat:    Irregular heart rhythm:        Vascular    Pain in calf, thigh, or hip brought on by ambulation:    Pain in feet at night that wakes you up from your sleep:     Blood clot in your veins:    Leg swelling:         Pulmonary    Oxygen at home:    Productive cough:     Wheezing:         Neurologic    Sudden weakness in arms or legs:     Sudden numbness in arms or legs:     Sudden onset of difficulty speaking or slurred speech:    Temporary loss of vision in one eye:     Problems with dizziness:         Gastrointestinal    Blood in stool:      Vomited blood:           Genitourinary    Burning when urinating:     Blood in urine:        Psychiatric    Major depression:  Hematologic    Bleeding problems:    Problems with blood clotting too easily:        Skin    Rashes or ulcers:        Constitutional    Fever or chills:     PHYSICAL EXAM:   Vitals:   10/26/18 1348  BP: 136/80  Pulse: 75  Resp: 16  Temp: 98.1 F (36.7 C)  TempSrc: Oral  SpO2: 96%  Weight: 196 lb (88.9 kg)  Height: 6\' 2"  (1.88 m)    GENERAL: The patient is a well-nourished male, in no acute distress. The vital signs are documented above. CARDIAC: There is a regular rate and rhythm.  VASCULAR: Palpable brachial artery and proximal brachiocephalic fistula PULMONARY: Nonlabored respirations MUSCULOSKELETAL: There are no major deformities or cyanosis. NEUROLOGIC: No focal weakness or paresthesias are detected. SKIN: There are no ulcers or rashes noted. PSYCHIATRIC: The patient has a normal affect.  STUDIES:   I have ordered and reviewed his vein mapping with the following findings: +-----------------+-------------+----------+---------+ Right Cephalic  Diameter (cm)Depth (cm)Findings  +-----------------+-------------+----------+---------+ Prox upper arm    0.26              +-----------------+-------------+----------+---------+ Mid upper arm    0.26              +-----------------+-------------+----------+---------+ Dist upper arm    0.34              +-----------------+-------------+----------+---------+ Antecubital fossa  0.39              +-----------------+-------------+----------+---------+ Prox forearm     0.36        branching +-----------------+-------------+----------+---------+ Mid forearm     0.30        branching +-----------------+-------------+----------+---------+ Dist forearm     0.29         branching +-----------------+-------------+----------+---------+  +-----------------+-------------+----------+---------+ Right Basilic  Diameter (cm)Depth (cm)Findings  +-----------------+-------------+----------+---------+ Prox upper arm    0.51              +-----------------+-------------+----------+---------+ Mid upper arm    0.54              +-----------------+-------------+----------+---------+ Dist upper arm    0.32              +-----------------+-------------+----------+---------+ Antecubital fossa  0.29        branching +-----------------+-------------+----------+---------+ Prox forearm     0.30              +-----------------+-------------+----------+---------+  +-----------------+-------------+----------+--------+ Left Cephalic  Diameter (cm)Depth (cm)Findings +-----------------+-------------+----------+--------+ Prox upper arm               AVF   +-----------------+-------------+----------+--------+ Mid upper arm               AVF   +-----------------+-------------+----------+--------+ Dist upper arm               AVF   +-----------------+-------------+----------+--------+ Antecubital fossa  0.34             +-----------------+-------------+----------+--------+ Prox forearm     0.30             +-----------------+-------------+----------+--------+ Mid forearm     0.32             +-----------------+-------------+----------+--------+ Dist forearm     0.47             +-----------------+-------------+----------+--------+  +-----------------+-------------+----------+--------+ Left Basilic   Diameter (cm)Depth (cm)Findings +-----------------+-------------+----------+--------+ Prox upper arm    0.49              +-----------------+-------------+----------+--------+  Mid upper arm    0.54             +-----------------+-------------+----------+--------+ Dist upper arm    0.59             +-----------------+-------------+----------+--------+ Antecubital fossa  0.33             +-----------------+-------------+----------+--------+ Prox forearm     0.22             +-----------------+-------------+----------+--------+  Arterial duplex was within normal limits.  ASSESSMENT and PLAN   After lengthy discussion, we have decided to proceed with a left basilic vein fistula.  This will all be done in the same setting on Thursday, November 21.  I discussed the details of the operation including the risks and benefits which include but are not limited to the risk of steal, wound healing issues and non-maturity.  All the patient's questions were answered.   Annamarie Major, MD [facility] [phone] Pager [phone]

## 2018-10-26 NOTE — H&P (View-Only) (Signed)
[facility] [place]  Patient name: [name] [name] MRN: [id number] DOB: [birth year] Sex: male   REQUESTING PROVIDER:    Dr. Lorrene Reid   REASON FOR CONSULT:    Dialysis access  HISTORY OF PRESENT ILLNESS:   [name] [name] is a 52 y.o. male, who is status post left brachiocephalic fistula by Dr. Scot Dock on 05/22/2018.  This has occluded.  He comes in today for new access.  His renal failure secondary to diabetes.  He is medically managed for hypertension.  He is on the kidney pancreas transplant list.  He currently has a catheter in place and is on dialysis Tuesday Thursday Saturday.  He continues to work.  PAST MEDICAL HISTORY    Past Medical History:  Diagnosis Date  . Bacteremia   . Diabetes mellitus   . Hypertension   . Pneumonia 02/2012   Strep pneumoniae bilateral pneumonia complicated by bacteremia  . Renal disorder    End stage, not on dialysis     FAMILY HISTORY   Family History  Problem Relation Age of Onset  . Diabetes Maternal Grandmother     SOCIAL HISTORY:   Social History   Socioeconomic History  . Marital status: Divorced    Spouse name: Not on file  . Number of children: Not on file  . Years of education: Not on file  . Highest education level: Not on file  Occupational History  . Occupation: Information systems manager: [employer]  Social Needs  . Financial resource strain: Not on file  . Food insecurity:    Worry: Not on file    Inability: Not on file  . Transportation needs:    Medical: Not on file    Non-medical: Not on file  Tobacco Use  . Smoking status: Never Smoker  . Smokeless tobacco: Never Used  Substance and Sexual Activity  . Alcohol use: Yes    Comment: rare   . Drug use: No  . Sexual activity: Not on file  Lifestyle  . Physical activity:    Days per week: Not on file    Minutes per session: Not on file  . Stress: Not on file  Relationships  .  Social connections:    Talks on phone: Not on file    Gets together: Not on file    Attends religious service: Not on file    Active member of club or organization: Not on file    Attends meetings of clubs or organizations: Not on file    Relationship status: Not on file  . Intimate partner violence:    Fear of current or ex partner: Not on file    Emotionally abused: Not on file    Physically abused: Not on file    Forced sexual activity: Not on file  Other Topics Concern  . Not on file  Social History Narrative   He is from [place] and lives at home here with his daughter. He is divorced. 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     ALLERGIES:    Allergies  Allergen Reactions  . Penicillins Rash and Other (See Comments)    Tolerating Ceftriaxone (03/12/12) Has patient had a PCN reaction causing immediate rash, facial/tongue/throat swelling, SOB or lightheadedness with hypotension: Yes Has patient had a PCN reaction causing severe rash involving mucus membranes or skin necrosis: Unk Has patient had a PCN reaction that required hospitalization: No Has patient  had a PCN reaction occurring within the last 10 years: No If all of the above answers are "NO", then may proceed with Cephalosporin use.     CURRENT MEDICATIONS:    Current Outpatient Medications  Medication Sig Dispense Refill  . amLODipine (NORVASC) 10 MG tablet Take 10 mg by mouth daily.     Marland Kitchen aspirin 81 MG tablet Take 81 mg by mouth every morning.     Marland Kitchen b complex-vitamin c-folic acid (NEPHRO-VITE) 0.8 MG TABS tablet Take 1 tablet by mouth daily.    . carvedilol (COREG) 25 MG tablet Take 1.5 tablets (37.5 mg total) by mouth 2 (two) times daily with a meal. 90 tablet 11  . Continuous Blood Gluc Receiver (FREESTYLE LIBRE READER) DEVI 1 Device by Does not apply route as directed. 1 Device 0  . Continuous Blood Gluc Sensor (FREESTYLE LIBRE SENSOR SYSTEM) MISC Apply sensor to upper  arm and change sensor every 10 days. 3 each 3  . insulin aspart (NOVOLOG FLEXPEN) 100 UNIT/ML FlexPen 5-15 units before meals (Patient taking differently: Inject 1-11 Units into the skin 3 (three) times daily with meals. Per sliding scale) 45 mL 1  . Insulin Pen Needle 31G X 5 MM MISC Use to inject [place] once daily 30 each 4  . lanthanum (FOSRENOL) 1000 MG chewable tablet Chew 1 tablet (1,000 mg total) by mouth 3 (three) times daily with meals. (Patient taking differently: Chew 2,000 mg by mouth 3 (three) times daily with meals. ) 90 tablet 0  . silver sulfADIAZINE (SILVADENE) 1 % cream Apply 1 application topically 2 (two) times daily. Apply to left posterior calf thermal injury (partial thickness) skin injury in a 1/8 inch layer.  Top with NS moistened gauze 4x4, follow with dry gauze 4x4 and secure with conform bandaging/paper tape. 50 g 0  . TRESIBA FLEXTOUCH 100 UNIT/ML SOPN FlexTouch Pen INJECT 0.38 MLS (38 UNITS TOTAL) INTO THE SKIN DAILY AT 10 PM. (Patient taking differently: Inject 38 Units into the skin at bedtime. ) 12 pen 2   No current facility-administered medications for this visit.     REVIEW OF SYSTEMS:   [X]  denotes positive finding, [ ]  denotes negative finding Cardiac  Comments:  Chest pain or chest pressure:    Shortness of breath upon exertion:    Short of breath when lying flat:    Irregular heart rhythm:        Vascular    Pain in calf, thigh, or hip brought on by ambulation:    Pain in feet at night that wakes you up from your sleep:     Blood clot in your veins:    Leg swelling:         Pulmonary    Oxygen at home:    Productive cough:     Wheezing:         Neurologic    Sudden weakness in arms or legs:     Sudden numbness in arms or legs:     Sudden onset of difficulty speaking or slurred speech:    Temporary loss of vision in one eye:     Problems with dizziness:         Gastrointestinal    Blood in stool:      Vomited blood:           Genitourinary    Burning when urinating:     Blood in urine:        Psychiatric    Major depression:  Hematologic    Bleeding problems:    Problems with blood clotting too easily:        Skin    Rashes or ulcers:        Constitutional    Fever or chills:     PHYSICAL EXAM:   Vitals:   10/26/18 1348  BP: 136/80  Pulse: 75  Resp: 16  Temp: 98.1 F (36.7 C)  TempSrc: Oral  SpO2: 96%  Weight: 196 lb (88.9 kg)  Height: 6\' 2"  (1.88 m)    GENERAL: The patient is a well-nourished male, in no acute distress. The vital signs are documented above. CARDIAC: There is a regular rate and rhythm.  VASCULAR: Palpable brachial artery and proximal brachiocephalic fistula PULMONARY: Nonlabored respirations MUSCULOSKELETAL: There are no major deformities or cyanosis. NEUROLOGIC: No focal weakness or paresthesias are detected. SKIN: There are no ulcers or rashes noted. PSYCHIATRIC: The patient has a normal affect.  STUDIES:   I have ordered and reviewed his vein mapping with the following findings: +-----------------+-------------+----------+---------+ Right Cephalic  Diameter (cm)Depth (cm)Findings  +-----------------+-------------+----------+---------+ Prox upper arm    0.26              +-----------------+-------------+----------+---------+ Mid upper arm    0.26              +-----------------+-------------+----------+---------+ Dist upper arm    0.34              +-----------------+-------------+----------+---------+ Antecubital fossa  0.39              +-----------------+-------------+----------+---------+ Prox forearm     0.36        branching +-----------------+-------------+----------+---------+ Mid forearm     0.30        branching +-----------------+-------------+----------+---------+ Dist forearm     0.29         branching +-----------------+-------------+----------+---------+  +-----------------+-------------+----------+---------+ Right Basilic  Diameter (cm)Depth (cm)Findings  +-----------------+-------------+----------+---------+ Prox upper arm    0.51              +-----------------+-------------+----------+---------+ Mid upper arm    0.54              +-----------------+-------------+----------+---------+ Dist upper arm    0.32              +-----------------+-------------+----------+---------+ Antecubital fossa  0.29        branching +-----------------+-------------+----------+---------+ Prox forearm     0.30              +-----------------+-------------+----------+---------+  +-----------------+-------------+----------+--------+ Left Cephalic  Diameter (cm)Depth (cm)Findings +-----------------+-------------+----------+--------+ Prox upper arm               AVF   +-----------------+-------------+----------+--------+ Mid upper arm               AVF   +-----------------+-------------+----------+--------+ Dist upper arm               AVF   +-----------------+-------------+----------+--------+ Antecubital fossa  0.34             +-----------------+-------------+----------+--------+ Prox forearm     0.30             +-----------------+-------------+----------+--------+ Mid forearm     0.32             +-----------------+-------------+----------+--------+ Dist forearm     0.47             +-----------------+-------------+----------+--------+  +-----------------+-------------+----------+--------+ Left Basilic   Diameter (cm)Depth (cm)Findings +-----------------+-------------+----------+--------+ Prox upper arm    0.49              +-----------------+-------------+----------+--------+  Mid upper arm    0.54             +-----------------+-------------+----------+--------+ Dist upper arm    0.59             +-----------------+-------------+----------+--------+ Antecubital fossa  0.33             +-----------------+-------------+----------+--------+ Prox forearm     0.22             +-----------------+-------------+----------+--------+  Arterial duplex was within normal limits.  ASSESSMENT and PLAN   After lengthy discussion, we have decided to proceed with a left basilic vein fistula.  This will all be done in the same setting on Thursday, November 21.  I discussed the details of the operation including the risks and benefits which include but are not limited to the risk of steal, wound healing issues and non-maturity.  All the patient's questions were answered.   Annamarie Major, MD [facility] [phone] Pager [phone]

## 2018-10-27 DIAGNOSIS — N186 End stage renal disease: Secondary | ICD-10-CM | POA: Diagnosis not present

## 2018-10-27 DIAGNOSIS — N2581 Secondary hyperparathyroidism of renal origin: Secondary | ICD-10-CM | POA: Diagnosis not present

## 2018-10-27 DIAGNOSIS — D689 Coagulation defect, unspecified: Secondary | ICD-10-CM | POA: Diagnosis not present

## 2018-10-28 ENCOUNTER — Other Ambulatory Visit: Payer: Self-pay | Admitting: Endocrinology

## 2018-10-28 NOTE — Telephone Encounter (Signed)
Refuse with note to make appointment

## 2018-10-28 NOTE — Telephone Encounter (Signed)
Done

## 2018-10-28 NOTE — Telephone Encounter (Signed)
Last OV 03/28/17 refill or refuse please advise

## 2018-11-03 DIAGNOSIS — N186 End stage renal disease: Secondary | ICD-10-CM | POA: Diagnosis not present

## 2018-11-03 DIAGNOSIS — N2581 Secondary hyperparathyroidism of renal origin: Secondary | ICD-10-CM | POA: Diagnosis not present

## 2018-11-03 DIAGNOSIS — D689 Coagulation defect, unspecified: Secondary | ICD-10-CM | POA: Diagnosis not present

## 2018-11-10 ENCOUNTER — Encounter (HOSPITAL_COMMUNITY): Payer: Self-pay | Admitting: *Deleted

## 2018-11-10 ENCOUNTER — Other Ambulatory Visit: Payer: Self-pay

## 2018-11-10 DIAGNOSIS — N186 End stage renal disease: Secondary | ICD-10-CM | POA: Diagnosis not present

## 2018-11-10 DIAGNOSIS — D689 Coagulation defect, unspecified: Secondary | ICD-10-CM | POA: Diagnosis not present

## 2018-11-10 DIAGNOSIS — N2581 Secondary hyperparathyroidism of renal origin: Secondary | ICD-10-CM | POA: Diagnosis not present

## 2018-11-10 NOTE — Progress Notes (Signed)
[name] [name] has Type II diabetes.  Patient reports that CBG's run in the low 100's.  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  I instructed patient to take 1/2 of Tresiba dose the evening prior to surgery. No Tresiba the morning of surgery.  IIf CBG > 220 take 1/2 of Novolog sliding scale dose. instructed patient to check CBG after awaking and every 2 hours until arrival  to the hospital.  I Instructed patient if CBG is less than 70 to drink  1/2 cup of a clear juice. Recheck CBG in 15 minutes then call pre- op desk at [phone] for further instructions. If scheduled to receive Insulin, do not take Insulin

## 2018-11-12 ENCOUNTER — Ambulatory Visit (HOSPITAL_COMMUNITY): Payer: 59 | Admitting: Certified Registered Nurse Anesthetist

## 2018-11-12 ENCOUNTER — Encounter (HOSPITAL_COMMUNITY)
Admission: RE | Disposition: A | Discharge status: Home / Self Care | Payer: Self-pay | Source: Ambulatory Visit | Attending: Surgery

## 2018-11-12 ENCOUNTER — Encounter (HOSPITAL_COMMUNITY): Payer: Self-pay

## 2018-11-12 ENCOUNTER — Other Ambulatory Visit: Payer: Self-pay

## 2018-11-12 ENCOUNTER — Ambulatory Visit (HOSPITAL_COMMUNITY)
Admission: RE | Admit: 2018-11-12 | Discharge: 2018-11-12 | Disposition: A | Discharge status: Home / Self Care | Payer: 59 | Source: Ambulatory Visit | Attending: Surgery | Admitting: Surgery

## 2018-11-12 DIAGNOSIS — I5033 Acute on chronic diastolic (congestive) heart failure: Secondary | ICD-10-CM | POA: Diagnosis not present

## 2018-11-12 DIAGNOSIS — Z7982 Long term (current) use of aspirin: Secondary | ICD-10-CM | POA: Diagnosis not present

## 2018-11-12 DIAGNOSIS — T82898A Other specified complication of vascular prosthetic devices, implants and grafts, initial encounter: Secondary | ICD-10-CM | POA: Insufficient documentation

## 2018-11-12 DIAGNOSIS — E1122 Type 2 diabetes mellitus with diabetic chronic kidney disease: Secondary | ICD-10-CM | POA: Diagnosis not present

## 2018-11-12 DIAGNOSIS — I132 Hypertensive heart and chronic kidney disease with heart failure and with stage 5 chronic kidney disease, or end stage renal disease: Secondary | ICD-10-CM | POA: Diagnosis not present

## 2018-11-12 DIAGNOSIS — Z794 Long term (current) use of insulin: Secondary | ICD-10-CM | POA: Insufficient documentation

## 2018-11-12 DIAGNOSIS — I509 Heart failure, unspecified: Secondary | ICD-10-CM | POA: Diagnosis not present

## 2018-11-12 DIAGNOSIS — N186 End stage renal disease: Secondary | ICD-10-CM | POA: Diagnosis not present

## 2018-11-12 DIAGNOSIS — Y832 Surgical operation with anastomosis, bypass or graft as the cause of abnormal reaction of the patient, or of later complication, without mention of misadventure at the time of the procedure: Secondary | ICD-10-CM | POA: Diagnosis not present

## 2018-11-12 DIAGNOSIS — Z992 Dependence on renal dialysis: Secondary | ICD-10-CM | POA: Diagnosis not present

## 2018-11-12 HISTORY — PX: BASCILIC VEIN TRANSPOSITION: SHX5742

## 2018-11-12 LAB — GLUCOSE, CAPILLARY
Glucose-Capillary: 55 mg/dL — ABNORMAL LOW (ref 70–99)
Glucose-Capillary: 113 mg/dL — ABNORMAL HIGH (ref 70–99)
Glucose-Capillary: 110 mg/dL — ABNORMAL HIGH (ref 70–99)

## 2018-11-12 LAB — POCT I-STAT 4, (NA,K, GLUC, HGB,HCT)
Glucose, Bld: 66 mg/dL — ABNORMAL LOW (ref 70–99)
HCT: 33 % — ABNORMAL LOW (ref 39.0–52.0)
Hemoglobin: 11.2 g/dL — ABNORMAL LOW (ref 13.0–17.0)
Potassium: 4.1 mmol/L (ref 3.5–5.1)
Sodium: 141 mmol/L (ref 135–145)

## 2018-11-12 SURGERY — TRANSPOSITION, VEIN, BASILIC
Anesthesia: General | Site: Arm Upper | Laterality: Left

## 2018-11-12 MED ORDER — SODIUM CHLORIDE 0.9 % IV SOLN
INTRAVENOUS | Status: DC
  Administered 2018-11-12 (×2): via INTRAVENOUS

## 2018-11-12 MED ORDER — PHENYLEPHRINE HCL 10 MG/ML IJ SOLN
INTRAMUSCULAR | Status: DC | PRN
  Administered 2018-11-12: 120 ug via INTRAVENOUS
  Administered 2018-11-12 (×2): 80 ug via INTRAVENOUS

## 2018-11-12 MED ORDER — OXYCODONE HCL 5 MG PO TABS: 5.0000 mg | ORAL_TABLET | Freq: Once | ORAL | Status: DC | PRN

## 2018-11-12 MED ORDER — DEXTROSE 50 % IV SOLN
INTRAVENOUS | Status: AC
  Administered 2018-11-12: 25 mL via INTRAVENOUS
  Filled 2018-11-12: qty 50

## 2018-11-12 MED ORDER — LIDOCAINE-EPINEPHRINE (PF) 1 %-1:200000 IJ SOLN
INTRAMUSCULAR | Status: AC
  Filled 2018-11-12: qty 30

## 2018-11-12 MED ORDER — ACETAMINOPHEN 160 MG/5ML PO SOLN: 1000.0000 mg | Freq: Once | ORAL | Status: DC | PRN

## 2018-11-12 MED ORDER — GLYCOPYRROLATE 0.2 MG/ML IJ SOLN
INTRAMUSCULAR | Status: DC | PRN
  Administered 2018-11-12 (×2): 0.1 mg via INTRAVENOUS

## 2018-11-12 MED ORDER — DEXAMETHASONE SODIUM PHOSPHATE 10 MG/ML IJ SOLN
INTRAMUSCULAR | Status: AC
  Filled 2018-11-12: qty 1

## 2018-11-12 MED ORDER — SODIUM CHLORIDE 0.9 % IV SOLN
INTRAVENOUS | Status: AC
  Filled 2018-11-12: qty 1.2

## 2018-11-12 MED ORDER — FENTANYL CITRATE (PF) 250 MCG/5ML IJ SOLN
INTRAMUSCULAR | Status: AC
  Filled 2018-11-12: qty 5

## 2018-11-12 MED ORDER — SODIUM CHLORIDE 0.9 % IV SOLN
INTRAVENOUS | Status: DC | PRN
  Administered 2018-11-12: 25 ug/min via INTRAVENOUS

## 2018-11-12 MED ORDER — PHENYLEPHRINE 40 MCG/ML (10ML) SYRINGE FOR IV PUSH (FOR BLOOD PRESSURE SUPPORT)
PREFILLED_SYRINGE | INTRAVENOUS | Status: AC
  Filled 2018-11-12: qty 10

## 2018-11-12 MED ORDER — SODIUM CHLORIDE 0.9 % IV SOLN
INTRAVENOUS | Status: DC | PRN
  Administered 2018-11-12: 08:00:00

## 2018-11-12 MED ORDER — 0.9 % SODIUM CHLORIDE (POUR BTL) OPTIME
TOPICAL | Status: DC | PRN
  Administered 2018-11-12: 1000 mL

## 2018-11-12 MED ORDER — ACETAMINOPHEN 500 MG PO TABS: 1000.0000 mg | ORAL_TABLET | Freq: Once | ORAL | Status: DC | PRN

## 2018-11-12 MED ORDER — CHLORHEXIDINE GLUCONATE 4 % EX LIQD: 60.0000 mL | Freq: Once | CUTANEOUS | Status: DC

## 2018-11-12 MED ORDER — DEXTROSE 50 % IV SOLN
25.0000 mL | Freq: Once | INTRAVENOUS | Status: AC
  Administered 2018-11-12: 25 mL via INTRAVENOUS

## 2018-11-12 MED ORDER — FENTANYL CITRATE (PF) 100 MCG/2ML IJ SOLN
INTRAMUSCULAR | Status: DC | PRN
  Administered 2018-11-12: 50 ug via INTRAVENOUS

## 2018-11-12 MED ORDER — EPHEDRINE SULFATE 50 MG/ML IJ SOLN
INTRAMUSCULAR | Status: DC | PRN
  Administered 2018-11-12 (×4): 10 mg via INTRAVENOUS

## 2018-11-12 MED ORDER — PROPOFOL 10 MG/ML IV BOLUS
INTRAVENOUS | Status: AC
  Filled 2018-11-12: qty 20

## 2018-11-12 MED ORDER — VANCOMYCIN HCL IN DEXTROSE 1-5 GM/200ML-% IV SOLN
1000.0000 mg | INTRAVENOUS | Status: AC
  Administered 2018-11-12: 1000 mg via INTRAVENOUS
  Filled 2018-11-12: qty 200

## 2018-11-12 MED ORDER — GLYCOPYRROLATE PF 0.2 MG/ML IJ SOSY
PREFILLED_SYRINGE | INTRAMUSCULAR | Status: AC
  Filled 2018-11-12: qty 1

## 2018-11-12 MED ORDER — PROPOFOL 10 MG/ML IV BOLUS
INTRAVENOUS | Status: DC | PRN
  Administered 2018-11-12: 160 mg via INTRAVENOUS
  Administered 2018-11-12: 100 mg via INTRAVENOUS
  Administered 2018-11-12: 40 mg via INTRAVENOUS

## 2018-11-12 MED ORDER — FENTANYL CITRATE (PF) 100 MCG/2ML IJ SOLN: 25.0000 ug | INTRAMUSCULAR | Status: DC | PRN

## 2018-11-12 MED ORDER — ONDANSETRON HCL 4 MG/2ML IJ SOLN
INTRAMUSCULAR | Status: AC
  Filled 2018-11-12: qty 2

## 2018-11-12 MED ORDER — OXYCODONE HCL 5 MG/5ML PO SOLN: 5.0000 mg | Freq: Once | ORAL | Status: DC | PRN

## 2018-11-12 MED ORDER — LIDOCAINE HCL (CARDIAC) PF 100 MG/5ML IV SOSY
PREFILLED_SYRINGE | INTRAVENOUS | Status: DC | PRN
  Administered 2018-11-12: 60 mg via INTRAVENOUS

## 2018-11-12 MED ORDER — HEMOSTATIC AGENTS (NO CHARGE) OPTIME
TOPICAL | Status: DC | PRN
  Administered 2018-11-12: 1 via TOPICAL

## 2018-11-12 MED ORDER — DEXAMETHASONE SODIUM PHOSPHATE 10 MG/ML IJ SOLN
INTRAMUSCULAR | Status: DC | PRN
  Administered 2018-11-12: 4 mg via INTRAVENOUS

## 2018-11-12 MED ORDER — HYDROCODONE-ACETAMINOPHEN 5-325 MG PO TABS: 1.0000 | ORAL_TABLET | Freq: Four times a day (QID) | ORAL | 0 refills | Status: DC | PRN

## 2018-11-12 MED ORDER — ACETAMINOPHEN 10 MG/ML IV SOLN: 1000.0000 mg | Freq: Once | INTRAVENOUS | Status: DC | PRN

## 2018-11-12 MED ORDER — ONDANSETRON HCL 4 MG/2ML IJ SOLN
INTRAMUSCULAR | Status: DC | PRN
  Administered 2018-11-12: 4 mg via INTRAVENOUS

## 2018-11-12 SURGICAL SUPPLY — 35 items
ARMBAND PINK RESTRICT EXTREMIT (MISCELLANEOUS) ×2 IMPLANT
BANDAGE ELASTIC 6 VELCRO ST LF (GAUZE/BANDAGES/DRESSINGS) ×2 IMPLANT
BNDG GAUZE ELAST 4 BULKY (GAUZE/BANDAGES/DRESSINGS) ×2 IMPLANT
CANISTER SUCT 3000ML PPV (MISCELLANEOUS) ×2 IMPLANT
CLIP VESOCCLUDE MED 24/CT (CLIP) IMPLANT
CLIP VESOCCLUDE MED 6/CT (CLIP) IMPLANT
CLIP VESOCCLUDE SM WIDE 24/CT (CLIP) IMPLANT
CLIP VESOCCLUDE SM WIDE 6/CT (CLIP) IMPLANT
COVER PROBE W GEL 5X96 (DRAPES) ×2 IMPLANT
COVER WAND RF STERILE (DRAPES) ×2 IMPLANT
DERMABOND ADVANCED (GAUZE/BANDAGES/DRESSINGS) ×1
DERMABOND ADVANCED .7 DNX12 (GAUZE/BANDAGES/DRESSINGS) ×1 IMPLANT
ELECT REM PT RETURN 9FT ADLT (ELECTROSURGICAL) ×2
ELECTRODE REM PT RTRN 9FT ADLT (ELECTROSURGICAL) ×1 IMPLANT
GLOVE BIOGEL PI IND STRL 7.5 (GLOVE) ×1 IMPLANT
GLOVE BIOGEL PI INDICATOR 7.5 (GLOVE) ×1
GLOVE SURG SS PI 7.5 STRL IVOR (GLOVE) ×2 IMPLANT
GOWN STRL REUS W/ TWL LRG LVL3 (GOWN DISPOSABLE) ×2 IMPLANT
GOWN STRL REUS W/ TWL XL LVL3 (GOWN DISPOSABLE) ×1 IMPLANT
GOWN STRL REUS W/TWL LRG LVL3 (GOWN DISPOSABLE) ×2
GOWN STRL REUS W/TWL XL LVL3 (GOWN DISPOSABLE) ×1
HEMOSTAT SNOW SURGICEL 2X4 (HEMOSTASIS) ×2 IMPLANT
KIT BASIN OR (CUSTOM PROCEDURE TRAY) ×2 IMPLANT
KIT TURNOVER KIT B (KITS) ×2 IMPLANT
NS IRRIG 1000ML POUR BTL (IV SOLUTION) ×2 IMPLANT
PACK CV ACCESS (CUSTOM PROCEDURE TRAY) ×2 IMPLANT
PAD ARMBOARD 7.5X6 YLW CONV (MISCELLANEOUS) ×4 IMPLANT
SUT PROLENE 6 0 CC (SUTURE) ×2 IMPLANT
SUT SILK 2 0 SH (SUTURE) IMPLANT
SUT VIC AB 3-0 SH 27 (SUTURE) ×3
SUT VIC AB 3-0 SH 27X BRD (SUTURE) ×3 IMPLANT
SUT VICRYL 4-0 PS2 18IN ABS (SUTURE) ×4 IMPLANT
TOWEL GREEN STERILE (TOWEL DISPOSABLE) ×2 IMPLANT
UNDERPAD 30X30 (UNDERPADS AND DIAPERS) ×2 IMPLANT
WATER STERILE IRR 1000ML POUR (IV SOLUTION) ×2 IMPLANT

## 2018-11-12 NOTE — Anesthesia Procedure Notes (Signed)
Procedure Name: LMA Insertion Date/Time: 11/12/2018 7:46 AM Performed by: Inda Coke, CRNA Pre-anesthesia Checklist: Patient identified, Emergency Drugs available, Suction available and Patient being monitored Patient Re-evaluated:Patient Re-evaluated prior to induction Oxygen Delivery Method: Circle System Utilized Preoxygenation: Pre-oxygenation with 100% oxygen Induction Type: IV induction Ventilation: Mask ventilation without difficulty and Oral airway inserted - appropriate to patient size LMA: LMA inserted LMA Size: 4.0 Number of attempts: 1 Airway Equipment and Method: Bite block Placement Confirmation: positive ETCO2 Tube secured with: Tape Dental Injury: Teeth and Oropharynx as per pre-operative assessment  Comments: LMA #5 inserted and unable to seat well. LMA #4 placed and seated well with +etco2 and adequate tidal volumes.

## 2018-11-12 NOTE — Progress Notes (Addendum)
Hypoglycemic Event  CBG: 55  Treatment: D50 IV 25 mL  Symptoms: Sweaty  Follow-up CBG: [id number] CBG Result:113  Possible Reasons for Event: Inadequate meal intake  Due to NPO for surgery  Comments/MD notified: Dr. Linna Caprice notified    [name] [name]

## 2018-11-12 NOTE — Anesthesia Postprocedure Evaluation (Signed)
Anesthesia Post Note  Patient: OWYNN MOSQUEDA  Procedure(s) Performed: BASILIC VEIN TRANSPOSITION LEFT ARM (Left Arm Upper)     Patient location during evaluation: PACU Anesthesia Type: General Level of consciousness: awake and alert Pain management: pain level controlled Vital Signs Assessment: post-procedure vital signs reviewed and stable Respiratory status: spontaneous breathing, nonlabored ventilation, respiratory function stable and patient connected to nasal cannula oxygen Cardiovascular status: blood pressure returned to baseline and stable Postop Assessment: no apparent nausea or vomiting Anesthetic complications: no    Last Vitals:  Vitals:   11/12/18 1025 11/12/18 1029  BP:  130/80  Pulse:  70  Resp:  16  Temp: 36.5 C   SpO2:  98%    Last Pain:  Vitals:   11/12/18 1029  TempSrc:   PainSc: 0-No pain                 Sherrye Puga

## 2018-11-12 NOTE — Transfer of Care (Signed)
Immediate Anesthesia Transfer of Care Note  Patient: [name] [name]  Procedure(s) Performed: [place] LEFT ARM (Left Arm Upper)  Patient Location: PACU  Anesthesia Type:General  Level of Consciousness: awake and drowsy  Airway & Oxygen Therapy: Patient Spontanous Breathing and Patient connected to nasal cannula oxygen  Post-op Assessment: Report given to RN and Post -op Vital signs reviewed and stable  Post vital signs: Reviewed and stable  Last Vitals:  Vitals Value Taken Time  BP 142/92 11/12/2018 10:07 AM  Temp    Pulse 70 11/12/2018 10:12 AM  Resp 16 11/12/2018 10:12 AM  SpO2 99 % 11/12/2018 10:12 AM  Vitals shown include unvalidated device data.  Last Pain:  Vitals:   11/12/18 1007  TempSrc:   PainSc: (P) 0-No pain         Complications: No apparent anesthesia complications

## 2018-11-12 NOTE — Op Note (Signed)
    Patient name: [name] [name] MRN: [id number] DOB: [birth year] Sex: male  11/12/2018 Pre-operative Diagnosis: ESRD Post-operative diagnosis:  Same Surgeon:  Annamarie Major Assistants:  Arlee Muslim Procedure:   #1:  Left basislic vein transposition   #2:  Ligation of left brachiocephalic fistula Anesthesia:  General Blood Loss:  50 cc Specimens:  none  Findings:  Excellent basilic vein, >4MG.  Health 42mm brachial artery  Indications: The patient has previously undergone a left brachiocephalic fistula which is occluded in the upper arm but patent proximally.  It is not usable for dialysis and he is using a catheter.  He comes in today for basilic vein fistula  Procedure:  The patient was identified in the holding area and taken to [place] [facility]  The patient was then placed supine on the table. general anesthesia was administered.  The patient was prepped and draped in the usual sterile fashion.  A time out was called and antibiotics were administered.  Ultrasound was used to evaluate the left basilic vein.  This was of excellent caliber measuring greater than 5 mm throughout.  2 longitudinal incisions were made in the left upper arm.  Through this incision I exposed the basilic vein which was an excellent, 5 to 6 mm vein.  Multiple side branches were ligated between silk ties.  The nerve was protected.  Once the basilic vein was fully mobilized from the axilla to the antecubital crease, it was marked with a ink pen and ligated distally.  It distended nicely with heparin saline.  There was still a thrill and the proximal half of the brachiocephalic fistula, and so I made a separate incision over top of the fistula and fully mobilized and ligated between 2-0 silk ties.  A curved Gore tunneler was then used to create a tunnel, going medial to the brachiocephalic fistula.  The basilic vein was then brought through the tunnel making sure to maintain proper orientation.  I then performed a end to  end anastomosis between the 2 ends of the vein using a running 6-0 Prolene.  Prior to completion the appropriate flushing maneuvers were performed.  I inspected the tunnel to make sure there were no kinks and that there was good hemostasis.  Once I was satisfied hemostasis, I used Doppler to evaluate the radial and ulnar artery which had biphasic signals and there was a good thrill within the fistula.  Hemostasis was then achieved.  The wound was irrigated.  All 3 incisions were closed with 2 layers of 3-0 Vicryl and Dermabond.  There were no immediate complications.  The patient was successfully extubated taken to recovery in stable condition.   Disposition: To PACU stable   V. Annamarie Major, M.D. [facility] [place] Office: [phone] Pager:  [phone]

## 2018-11-12 NOTE — Anesthesia Preprocedure Evaluation (Signed)
Anesthesia Evaluation  Patient identified by MRN, date of birth, ID band Patient awake    Reviewed: Allergy & Precautions, NPO status , Patient's Chart, lab work & pertinent test results  Airway Mallampati: I  TM Distance: >3 FB Neck ROM: Full    Dental  (+) Teeth Intact   Pulmonary shortness of breath and with exertion,    breath sounds clear to auscultation       Cardiovascular hypertension, Pt. on medications +CHF   Rhythm:Regular     Neuro/Psych negative neurological ROS  negative psych ROS   GI/Hepatic negative GI ROS, Neg liver ROS,   Endo/Other  diabetes, Type 2, Oral Hypoglycemic Agents  Renal/GU ESRF and DialysisRenal diseaseHD Tuesday 11/19     Musculoskeletal   Abdominal   Peds  Hematology  (+) anemia ,   Anesthesia Other Findings   Reproductive/Obstetrics                             Anesthesia Physical Anesthesia Plan  ASA: III  Anesthesia Plan: General   Post-op Pain Management:    Induction: Intravenous  PONV Risk Score and Plan: 2 and Ondansetron and Treatment may vary due to age or medical condition  Airway Management Planned: LMA  Additional Equipment: None  Intra-op Plan:   Post-operative Plan: Extubation in OR  Informed Consent: I have reviewed the patients History and Physical, chart, labs and discussed the procedure including the risks, benefits and alternatives for the proposed anesthesia with the patient or authorized representative who has indicated his/her understanding and acceptance.   Dental advisory given  Plan Discussed with: CRNA and Surgeon  Anesthesia Plan Comments:         Anesthesia Quick Evaluation

## 2018-11-12 NOTE — Interval H&P Note (Signed)
History and Physical Interval Note:  11/12/2018 7:22 AM  [name] [name]  has presented today for surgery, with the diagnosis of END STAGE RENAL DISEASE FOR HEMODIALYSIS  The various methods of treatment have been discussed with the patient and family. After consideration of risks, benefits and other options for treatment, the patient has consented to  Procedure(s): [place] (Left) as a surgical intervention .  The patient's history has been reviewed, patient examined, no change in status, stable for surgery.  I have reviewed the patient's chart and labs.  Questions were answered to the patient's satisfaction.     Annamarie Major

## 2018-11-12 NOTE — Discharge Instructions (Signed)
° °  [facility] [place] ° °Discharge Instructions ° °AV Fistula or Graft Surgery for Dialysis Access ° °Please refer to the following instructions for your post-procedure care. Your surgeon or physician assistant will discuss any changes with you. ° °Activity ° °You may drive the day following your surgery, if you are comfortable and no longer taking prescription pain medication. Resume full activity as the soreness in your incision resolves. ° °Bathing/Showering ° °You may shower after you go home. Keep your incision dry for 48 hours. Do not soak in a bathtub, hot tub, or swim until the incision heals completely. You may not shower if you have a hemodialysis catheter. ° °Incision Care ° °Clean your incision with mild soap and water after 48 hours. Pat the area dry with a clean towel. You do not need a bandage unless otherwise instructed. Do not apply any ointments or creams to your incision. You may have skin glue on your incision. Do not peel it off. It will come off on its own in about one week. Your arm may swell a bit after surgery. To reduce swelling use pillows to elevate your arm so it is above your heart. Your doctor will tell you if you need to lightly wrap your arm with an ACE bandage. ° °Diet ° °Resume your normal diet. There are not special food restrictions following this procedure. In order to heal from your surgery, it is CRITICAL to get adequate nutrition. Your body requires vitamins, minerals, and protein. Vegetables are the best source of vitamins and minerals. Vegetables also provide the perfect balance of protein. Processed food has little nutritional value, so try to avoid this. ° °Medications ° °Resume taking all of your medications. If your incision is causing pain, you may take over-the counter pain relievers such as acetaminophen (Tylenol). If you were prescribed a stronger pain medication, please be aware these medications can cause nausea and constipation. Prevent  nausea by taking the medication with a snack or meal. Avoid constipation by drinking plenty of fluids and eating foods with high amount of fiber, such as fruits, vegetables, and grains. Do not take Tylenol if you are taking prescription pain medications. ° ° ° ° °Follow up °Your surgeon may want to see you in the office following your access surgery. If so, this will be arranged at the time of your surgery. ° °Please call us immediately for any of the following conditions: ° °Increased pain, redness, drainage (pus) from your incision site °Fever of 101 degrees or higher °Severe or worsening pain at your incision site °Hand pain or numbness. ° °Reduce your risk of vascular disease: ° °Stop smoking. If you would like help, call [facility] at 1-800-QUIT-NOW (1-800-784-8669) or [facility] at 336-586-4000 ° °Manage your cholesterol °Maintain a desired weight °Control your diabetes °Keep your blood pressure down ° °Dialysis ° °It will take several weeks to several months for your new dialysis access to be ready for use. Your surgeon will determine when it is OK to use it. Your nephrologist will continue to direct your dialysis. You can continue to use your Permcath until your new access is ready for use. ° °If you have any questions, please call the office at [phone]. ° °

## 2018-11-13 ENCOUNTER — Telehealth: Payer: Self-pay | Admitting: Surgery

## 2018-11-13 ENCOUNTER — Encounter (HOSPITAL_COMMUNITY): Payer: Self-pay | Admitting: Surgery

## 2018-11-13 NOTE — Telephone Encounter (Signed)
-----   Message from Mena Goes, RN sent at 11/12/2018 10:12 AM EST ----- Regarding: 6 weeks PA clinic and fistula duplex   ----- Message ----- From: Iline Oven Sent: 11/12/2018   9:51 AM EST To: [facility] Admin Pool, [facility] Charge [facility]  Can you schedule an appt for this pt with fistula duplex in 6 weeks on PA clinic.  PO L arm basilic transposed fistula. Thanks, [facility]

## 2018-11-13 NOTE — Telephone Encounter (Signed)
sch appt spk to pt mld ltr 12/21/18 1pm Dialysis Duplex 2pm p/o PA

## 2018-11-16 DIAGNOSIS — D689 Coagulation defect, unspecified: Secondary | ICD-10-CM | POA: Diagnosis not present

## 2018-11-16 DIAGNOSIS — N186 End stage renal disease: Secondary | ICD-10-CM | POA: Diagnosis not present

## 2018-11-16 DIAGNOSIS — N2581 Secondary hyperparathyroidism of renal origin: Secondary | ICD-10-CM | POA: Diagnosis not present

## 2018-11-21 DIAGNOSIS — E1022 Type 1 diabetes mellitus with diabetic chronic kidney disease: Secondary | ICD-10-CM | POA: Diagnosis not present

## 2018-11-21 DIAGNOSIS — Z992 Dependence on renal dialysis: Secondary | ICD-10-CM | POA: Diagnosis not present

## 2018-11-21 DIAGNOSIS — N186 End stage renal disease: Secondary | ICD-10-CM | POA: Diagnosis not present

## 2018-11-24 DIAGNOSIS — N2581 Secondary hyperparathyroidism of renal origin: Secondary | ICD-10-CM | POA: Diagnosis not present

## 2018-11-24 DIAGNOSIS — D689 Coagulation defect, unspecified: Secondary | ICD-10-CM | POA: Diagnosis not present

## 2018-11-24 DIAGNOSIS — N186 End stage renal disease: Secondary | ICD-10-CM | POA: Diagnosis not present

## 2018-11-30 ENCOUNTER — Other Ambulatory Visit: Payer: Self-pay

## 2018-11-30 DIAGNOSIS — Z992 Dependence on renal dialysis: Secondary | ICD-10-CM

## 2018-11-30 DIAGNOSIS — N186 End stage renal disease: Secondary | ICD-10-CM

## 2018-12-01 DIAGNOSIS — N2581 Secondary hyperparathyroidism of renal origin: Secondary | ICD-10-CM | POA: Diagnosis not present

## 2018-12-01 DIAGNOSIS — N186 End stage renal disease: Secondary | ICD-10-CM | POA: Diagnosis not present

## 2018-12-01 DIAGNOSIS — D689 Coagulation defect, unspecified: Secondary | ICD-10-CM | POA: Diagnosis not present

## 2018-12-08 DIAGNOSIS — N2581 Secondary hyperparathyroidism of renal origin: Secondary | ICD-10-CM | POA: Diagnosis not present

## 2018-12-08 DIAGNOSIS — N186 End stage renal disease: Secondary | ICD-10-CM | POA: Diagnosis not present

## 2018-12-08 DIAGNOSIS — D509 Iron deficiency anemia, unspecified: Secondary | ICD-10-CM | POA: Diagnosis not present

## 2018-12-14 DIAGNOSIS — N2581 Secondary hyperparathyroidism of renal origin: Secondary | ICD-10-CM | POA: Diagnosis not present

## 2018-12-14 DIAGNOSIS — D509 Iron deficiency anemia, unspecified: Secondary | ICD-10-CM | POA: Diagnosis not present

## 2018-12-14 DIAGNOSIS — N186 End stage renal disease: Secondary | ICD-10-CM | POA: Diagnosis not present

## 2018-12-21 ENCOUNTER — Encounter (HOSPITAL_COMMUNITY): Payer: 59

## 2018-12-21 DIAGNOSIS — J811 Chronic pulmonary edema: Secondary | ICD-10-CM | POA: Diagnosis not present

## 2018-12-21 DIAGNOSIS — N189 Chronic kidney disease, unspecified: Secondary | ICD-10-CM | POA: Diagnosis not present

## 2018-12-21 DIAGNOSIS — J90 Pleural effusion, not elsewhere classified: Secondary | ICD-10-CM | POA: Diagnosis not present

## 2018-12-21 DIAGNOSIS — E1122 Type 2 diabetes mellitus with diabetic chronic kidney disease: Secondary | ICD-10-CM | POA: Diagnosis not present

## 2018-12-21 DIAGNOSIS — N186 End stage renal disease: Secondary | ICD-10-CM | POA: Diagnosis not present

## 2018-12-21 DIAGNOSIS — Z01818 Encounter for other preprocedural examination: Secondary | ICD-10-CM | POA: Diagnosis not present

## 2018-12-22 DIAGNOSIS — Z992 Dependence on renal dialysis: Secondary | ICD-10-CM | POA: Diagnosis not present

## 2018-12-22 DIAGNOSIS — E1022 Type 1 diabetes mellitus with diabetic chronic kidney disease: Secondary | ICD-10-CM | POA: Diagnosis not present

## 2018-12-22 DIAGNOSIS — N186 End stage renal disease: Secondary | ICD-10-CM | POA: Diagnosis not present

## 2018-12-22 DIAGNOSIS — N2581 Secondary hyperparathyroidism of renal origin: Secondary | ICD-10-CM | POA: Diagnosis not present

## 2018-12-22 DIAGNOSIS — D509 Iron deficiency anemia, unspecified: Secondary | ICD-10-CM | POA: Diagnosis not present

## 2018-12-24 DIAGNOSIS — D689 Coagulation defect, unspecified: Secondary | ICD-10-CM | POA: Diagnosis not present

## 2018-12-24 DIAGNOSIS — N2581 Secondary hyperparathyroidism of renal origin: Secondary | ICD-10-CM | POA: Diagnosis not present

## 2018-12-24 DIAGNOSIS — N186 End stage renal disease: Secondary | ICD-10-CM | POA: Diagnosis not present

## 2018-12-28 DIAGNOSIS — N2581 Secondary hyperparathyroidism of renal origin: Secondary | ICD-10-CM | POA: Diagnosis not present

## 2018-12-28 DIAGNOSIS — D689 Coagulation defect, unspecified: Secondary | ICD-10-CM | POA: Diagnosis not present

## 2018-12-28 DIAGNOSIS — N186 End stage renal disease: Secondary | ICD-10-CM | POA: Diagnosis not present

## 2019-01-05 DIAGNOSIS — N186 End stage renal disease: Secondary | ICD-10-CM | POA: Diagnosis not present

## 2019-01-05 DIAGNOSIS — D689 Coagulation defect, unspecified: Secondary | ICD-10-CM | POA: Diagnosis not present

## 2019-01-05 DIAGNOSIS — N2581 Secondary hyperparathyroidism of renal origin: Secondary | ICD-10-CM | POA: Diagnosis not present

## 2019-01-11 DIAGNOSIS — D689 Coagulation defect, unspecified: Secondary | ICD-10-CM | POA: Diagnosis not present

## 2019-01-11 DIAGNOSIS — N2581 Secondary hyperparathyroidism of renal origin: Secondary | ICD-10-CM | POA: Diagnosis not present

## 2019-01-11 DIAGNOSIS — N186 End stage renal disease: Secondary | ICD-10-CM | POA: Diagnosis not present

## 2019-01-12 DIAGNOSIS — I272 Pulmonary hypertension, unspecified: Secondary | ICD-10-CM | POA: Diagnosis not present

## 2019-01-12 DIAGNOSIS — I371 Nonrheumatic pulmonary valve insufficiency: Secondary | ICD-10-CM | POA: Diagnosis not present

## 2019-01-12 DIAGNOSIS — Z01818 Encounter for other preprocedural examination: Secondary | ICD-10-CM | POA: Diagnosis not present

## 2019-01-18 DIAGNOSIS — Z01818 Encounter for other preprocedural examination: Secondary | ICD-10-CM | POA: Diagnosis not present

## 2019-01-18 DIAGNOSIS — I429 Cardiomyopathy, unspecified: Secondary | ICD-10-CM | POA: Diagnosis not present

## 2019-01-18 DIAGNOSIS — I255 Ischemic cardiomyopathy: Secondary | ICD-10-CM | POA: Diagnosis not present

## 2019-01-18 DIAGNOSIS — I420 Dilated cardiomyopathy: Secondary | ICD-10-CM | POA: Diagnosis not present

## 2019-01-18 DIAGNOSIS — E785 Hyperlipidemia, unspecified: Secondary | ICD-10-CM | POA: Diagnosis not present

## 2019-01-18 DIAGNOSIS — I120 Hypertensive chronic kidney disease with stage 5 chronic kidney disease or end stage renal disease: Secondary | ICD-10-CM | POA: Diagnosis not present

## 2019-01-19 DIAGNOSIS — N2581 Secondary hyperparathyroidism of renal origin: Secondary | ICD-10-CM | POA: Diagnosis not present

## 2019-01-19 DIAGNOSIS — D689 Coagulation defect, unspecified: Secondary | ICD-10-CM | POA: Diagnosis not present

## 2019-01-19 DIAGNOSIS — N186 End stage renal disease: Secondary | ICD-10-CM | POA: Diagnosis not present

## 2019-01-22 DIAGNOSIS — N186 End stage renal disease: Secondary | ICD-10-CM | POA: Diagnosis not present

## 2019-01-22 DIAGNOSIS — E1022 Type 1 diabetes mellitus with diabetic chronic kidney disease: Secondary | ICD-10-CM | POA: Diagnosis not present

## 2019-01-22 DIAGNOSIS — Z992 Dependence on renal dialysis: Secondary | ICD-10-CM | POA: Diagnosis not present

## 2019-01-23 DIAGNOSIS — D689 Coagulation defect, unspecified: Secondary | ICD-10-CM | POA: Diagnosis not present

## 2019-01-23 DIAGNOSIS — N2581 Secondary hyperparathyroidism of renal origin: Secondary | ICD-10-CM | POA: Diagnosis not present

## 2019-01-23 DIAGNOSIS — N186 End stage renal disease: Secondary | ICD-10-CM | POA: Diagnosis not present

## 2019-01-25 ENCOUNTER — Ambulatory Visit
Admission: RE | Admit: 2019-01-25 | Discharge: 2019-01-25 | Disposition: A | Discharge status: Home / Self Care | Payer: 59 | Source: Ambulatory Visit | Attending: Physician Assistant | Admitting: Physician Assistant

## 2019-01-25 ENCOUNTER — Other Ambulatory Visit: Payer: Self-pay | Admitting: Physician Assistant

## 2019-01-25 DIAGNOSIS — T1490XA Injury, unspecified, initial encounter: Secondary | ICD-10-CM

## 2019-01-25 DIAGNOSIS — M2042 Other hammer toe(s) (acquired), left foot: Secondary | ICD-10-CM | POA: Diagnosis not present

## 2019-01-25 DIAGNOSIS — L97522 Non-pressure chronic ulcer of other part of left foot with fat layer exposed: Secondary | ICD-10-CM | POA: Diagnosis not present

## 2019-01-25 DIAGNOSIS — S99922A Unspecified injury of left foot, initial encounter: Secondary | ICD-10-CM | POA: Diagnosis not present

## 2019-01-26 DIAGNOSIS — N186 End stage renal disease: Secondary | ICD-10-CM | POA: Diagnosis not present

## 2019-01-26 DIAGNOSIS — N2581 Secondary hyperparathyroidism of renal origin: Secondary | ICD-10-CM | POA: Diagnosis not present

## 2019-01-26 DIAGNOSIS — D689 Coagulation defect, unspecified: Secondary | ICD-10-CM | POA: Diagnosis not present

## 2019-02-01 DIAGNOSIS — M19042 Primary osteoarthritis, left hand: Secondary | ICD-10-CM | POA: Diagnosis not present

## 2019-02-01 DIAGNOSIS — L089 Local infection of the skin and subcutaneous tissue, unspecified: Secondary | ICD-10-CM | POA: Diagnosis not present

## 2019-02-01 DIAGNOSIS — M86172 Other acute osteomyelitis, left ankle and foot: Secondary | ICD-10-CM | POA: Diagnosis not present

## 2019-02-01 DIAGNOSIS — I132 Hypertensive heart and chronic kidney disease with heart failure and with stage 5 chronic kidney disease, or end stage renal disease: Secondary | ICD-10-CM | POA: Diagnosis not present

## 2019-02-01 DIAGNOSIS — M86272 Subacute osteomyelitis, left ankle and foot: Secondary | ICD-10-CM | POA: Diagnosis not present

## 2019-02-01 DIAGNOSIS — M20092 Other deformity of left finger(s): Secondary | ICD-10-CM | POA: Diagnosis not present

## 2019-02-01 DIAGNOSIS — E11621 Type 2 diabetes mellitus with foot ulcer: Secondary | ICD-10-CM | POA: Diagnosis not present

## 2019-02-01 DIAGNOSIS — M869 Osteomyelitis, unspecified: Secondary | ICD-10-CM | POA: Diagnosis not present

## 2019-02-01 DIAGNOSIS — E1169 Type 2 diabetes mellitus with other specified complication: Secondary | ICD-10-CM | POA: Diagnosis not present

## 2019-02-01 DIAGNOSIS — M868X7 Other osteomyelitis, ankle and foot: Secondary | ICD-10-CM | POA: Diagnosis not present

## 2019-02-01 DIAGNOSIS — M7989 Other specified soft tissue disorders: Secondary | ICD-10-CM | POA: Diagnosis not present

## 2019-02-01 DIAGNOSIS — Z01810 Encounter for preprocedural cardiovascular examination: Secondary | ICD-10-CM | POA: Diagnosis not present

## 2019-02-01 DIAGNOSIS — N186 End stage renal disease: Secondary | ICD-10-CM | POA: Diagnosis not present

## 2019-02-01 DIAGNOSIS — I878 Other specified disorders of veins: Secondary | ICD-10-CM | POA: Diagnosis not present

## 2019-02-01 DIAGNOSIS — I872 Venous insufficiency (chronic) (peripheral): Secondary | ICD-10-CM | POA: Diagnosis not present

## 2019-02-01 DIAGNOSIS — M19072 Primary osteoarthritis, left ankle and foot: Secondary | ICD-10-CM | POA: Diagnosis not present

## 2019-02-01 DIAGNOSIS — L97522 Non-pressure chronic ulcer of other part of left foot with fat layer exposed: Secondary | ICD-10-CM | POA: Diagnosis not present

## 2019-02-01 DIAGNOSIS — R2242 Localized swelling, mass and lump, left lower limb: Secondary | ICD-10-CM | POA: Diagnosis not present

## 2019-02-01 DIAGNOSIS — Z89022 Acquired absence of left finger(s): Secondary | ICD-10-CM | POA: Diagnosis not present

## 2019-02-01 DIAGNOSIS — I5020 Unspecified systolic (congestive) heart failure: Secondary | ICD-10-CM | POA: Diagnosis not present

## 2019-02-01 DIAGNOSIS — M7732 Calcaneal spur, left foot: Secondary | ICD-10-CM | POA: Diagnosis not present

## 2019-02-01 DIAGNOSIS — E1122 Type 2 diabetes mellitus with diabetic chronic kidney disease: Secondary | ICD-10-CM | POA: Diagnosis not present

## 2019-02-01 DIAGNOSIS — L03116 Cellulitis of left lower limb: Secondary | ICD-10-CM | POA: Diagnosis not present

## 2019-02-01 DIAGNOSIS — I5022 Chronic systolic (congestive) heart failure: Secondary | ICD-10-CM | POA: Diagnosis not present

## 2019-02-01 DIAGNOSIS — Z992 Dependence on renal dialysis: Secondary | ICD-10-CM | POA: Diagnosis not present

## 2019-02-02 ENCOUNTER — Other Ambulatory Visit: Payer: Self-pay

## 2019-02-02 MED ORDER — FREESTYLE LIBRE 14 DAY SENSOR MISC: 1.0000 | 1 refills | Status: DC

## 2019-02-04 DIAGNOSIS — N2581 Secondary hyperparathyroidism of renal origin: Secondary | ICD-10-CM | POA: Diagnosis not present

## 2019-02-04 DIAGNOSIS — L03031 Cellulitis of right toe: Secondary | ICD-10-CM | POA: Diagnosis not present

## 2019-02-04 DIAGNOSIS — N186 End stage renal disease: Secondary | ICD-10-CM | POA: Diagnosis not present

## 2019-02-05 DIAGNOSIS — I2510 Atherosclerotic heart disease of native coronary artery without angina pectoris: Secondary | ICD-10-CM | POA: Diagnosis not present

## 2019-02-05 DIAGNOSIS — I132 Hypertensive heart and chronic kidney disease with heart failure and with stage 5 chronic kidney disease, or end stage renal disease: Secondary | ICD-10-CM | POA: Diagnosis not present

## 2019-02-05 DIAGNOSIS — I5020 Unspecified systolic (congestive) heart failure: Secondary | ICD-10-CM | POA: Diagnosis not present

## 2019-02-06 ENCOUNTER — Emergency Department (HOSPITAL_BASED_OUTPATIENT_CLINIC_OR_DEPARTMENT_OTHER)
Admission: EM | Admit: 2019-02-06 | Discharge: 2019-02-07 | Disposition: A | Discharge status: Home / Self Care | Payer: 59 | Attending: Emergency Medicine | Admitting: Emergency Medicine

## 2019-02-06 ENCOUNTER — Other Ambulatory Visit: Payer: Self-pay

## 2019-02-06 ENCOUNTER — Encounter (HOSPITAL_BASED_OUTPATIENT_CLINIC_OR_DEPARTMENT_OTHER): Payer: Self-pay | Admitting: *Deleted

## 2019-02-06 ENCOUNTER — Emergency Department (HOSPITAL_BASED_OUTPATIENT_CLINIC_OR_DEPARTMENT_OTHER): Payer: 59

## 2019-02-06 DIAGNOSIS — R112 Nausea with vomiting, unspecified: Secondary | ICD-10-CM | POA: Diagnosis not present

## 2019-02-06 DIAGNOSIS — R7989 Other specified abnormal findings of blood chemistry: Secondary | ICD-10-CM

## 2019-02-06 DIAGNOSIS — N186 End stage renal disease: Secondary | ICD-10-CM | POA: Diagnosis not present

## 2019-02-06 DIAGNOSIS — Z992 Dependence on renal dialysis: Secondary | ICD-10-CM | POA: Insufficient documentation

## 2019-02-06 DIAGNOSIS — E1129 Type 2 diabetes mellitus with other diabetic kidney complication: Secondary | ICD-10-CM | POA: Insufficient documentation

## 2019-02-06 DIAGNOSIS — Z79899 Other long term (current) drug therapy: Secondary | ICD-10-CM | POA: Insufficient documentation

## 2019-02-06 DIAGNOSIS — R509 Fever, unspecified: Secondary | ICD-10-CM

## 2019-02-06 DIAGNOSIS — I132 Hypertensive heart and chronic kidney disease with heart failure and with stage 5 chronic kidney disease, or end stage renal disease: Secondary | ICD-10-CM | POA: Diagnosis not present

## 2019-02-06 DIAGNOSIS — I5033 Acute on chronic diastolic (congestive) heart failure: Secondary | ICD-10-CM | POA: Insufficient documentation

## 2019-02-06 DIAGNOSIS — J029 Acute pharyngitis, unspecified: Secondary | ICD-10-CM | POA: Diagnosis not present

## 2019-02-06 DIAGNOSIS — Z794 Long term (current) use of insulin: Secondary | ICD-10-CM | POA: Diagnosis not present

## 2019-02-06 DIAGNOSIS — R1110 Vomiting, unspecified: Secondary | ICD-10-CM | POA: Diagnosis present

## 2019-02-06 DIAGNOSIS — R188 Other ascites: Secondary | ICD-10-CM | POA: Diagnosis not present

## 2019-02-06 DIAGNOSIS — R945 Abnormal results of liver function studies: Secondary | ICD-10-CM | POA: Diagnosis not present

## 2019-02-06 HISTORY — DX: Dependence on renal dialysis: Z99.2

## 2019-02-06 LAB — CBC WITH DIFFERENTIAL/PLATELET
Abs Immature Granulocytes: 0.04 10*3/uL (ref 0.00–0.07)
Basophils Absolute: 0 10*3/uL (ref 0.0–0.1)
Basophils Relative: 0 %
Eosinophils Absolute: 0 10*3/uL (ref 0.0–0.5)
Eosinophils Relative: 0 %
HCT: 38.1 % — ABNORMAL LOW (ref 39.0–52.0)
Hemoglobin: 11.7 g/dL — ABNORMAL LOW (ref 13.0–17.0)
Immature Granulocytes: 0 %
Lymphocytes Relative: 4 %
Lymphs Abs: 0.4 10*3/uL — ABNORMAL LOW (ref 0.7–4.0)
MCH: 29.3 pg (ref 26.0–34.0)
MCHC: 30.7 g/dL (ref 30.0–36.0)
MCV: 95.3 fL (ref 80.0–100.0)
Monocytes Absolute: 0.6 10*3/uL (ref 0.1–1.0)
Monocytes Relative: 6 %
Neutro Abs: 9 10*3/uL — ABNORMAL HIGH (ref 1.7–7.7)
Neutrophils Relative %: 90 %
Platelets: 130 10*3/uL — ABNORMAL LOW (ref 150–400)
RBC: 4 MIL/uL — ABNORMAL LOW (ref 4.22–5.81)
RDW: 15.1 % (ref 11.5–15.5)
WBC: 10.1 10*3/uL (ref 4.0–10.5)
nRBC: 0 % (ref 0.0–0.2)

## 2019-02-06 LAB — COMPREHENSIVE METABOLIC PANEL
ALT: 150 U/L — ABNORMAL HIGH (ref 0–44)
AST: 116 U/L — ABNORMAL HIGH (ref 15–41)
Albumin: 3.6 g/dL (ref 3.5–5.0)
Alkaline Phosphatase: 229 U/L — ABNORMAL HIGH (ref 38–126)
Anion gap: 12 (ref 5–15)
BUN: 43 mg/dL — ABNORMAL HIGH (ref 6–20)
CO2: 23 mmol/L (ref 22–32)
Calcium: 9.1 mg/dL (ref 8.9–10.3)
Chloride: 98 mmol/L (ref 98–111)
Creatinine, Ser: 5.58 mg/dL — ABNORMAL HIGH (ref 0.61–1.24)
GFR calc Af Amer: 12 mL/min — ABNORMAL LOW (ref >60)
GFR calc non Af Amer: 11 mL/min — ABNORMAL LOW (ref >60)
Glucose, Bld: 136 mg/dL — ABNORMAL HIGH (ref 70–99)
Potassium: 4.8 mmol/L (ref 3.5–5.1)
Sodium: 133 mmol/L — ABNORMAL LOW (ref 135–145)
Total Bilirubin: 1 mg/dL (ref 0.3–1.2)
Total Protein: 7.6 g/dL (ref 6.5–8.1)

## 2019-02-06 LAB — LIPASE, BLOOD: Lipase: 25 U/L (ref 11–51)

## 2019-02-06 LAB — LACTIC ACID, PLASMA: Lactic Acid, Venous: 1.6 mmol/L (ref 0.5–1.9)

## 2019-02-06 MED ORDER — ONDANSETRON HCL 4 MG/2ML IJ SOLN
4.0000 mg | Freq: Once | INTRAMUSCULAR | Status: AC
  Administered 2019-02-06: 4 mg via INTRAVENOUS
  Filled 2019-02-06: qty 2

## 2019-02-06 MED ORDER — METRONIDAZOLE IN NACL 5-0.79 MG/ML-% IV SOLN
500.0000 mg | Freq: Three times a day (TID) | INTRAVENOUS | Status: DC
  Administered 2019-02-06: 500 mg via INTRAVENOUS
  Filled 2019-02-06: qty 100

## 2019-02-06 MED ORDER — LEVOFLOXACIN 250 MG PO TABS: 250.0000 mg | ORAL_TABLET | ORAL | 0 refills | Status: DC

## 2019-02-06 MED ORDER — SODIUM CHLORIDE 0.9 % IV SOLN
1.0000 g | INTRAVENOUS | Status: DC
  Administered 2019-02-06: 1 g via INTRAVENOUS

## 2019-02-06 MED ORDER — CEFEPIME HCL 1 G IJ SOLR
INTRAMUSCULAR | Status: AC
  Filled 2019-02-06: qty 1

## 2019-02-06 MED ORDER — ACETAMINOPHEN 325 MG PO TABS
650.0000 mg | ORAL_TABLET | Freq: Once | ORAL | Status: AC
  Administered 2019-02-06: 650 mg via ORAL
  Filled 2019-02-06: qty 2

## 2019-02-06 MED ORDER — DOXYCYCLINE HYCLATE 100 MG PO CAPS: 100.0000 mg | ORAL_CAPSULE | Freq: Two times a day (BID) | ORAL | 0 refills | Status: DC

## 2019-02-06 MED ORDER — SODIUM CHLORIDE 0.9 % IV SOLN
2.0000 g | Freq: Once | INTRAVENOUS | Status: DC
  Filled 2019-02-06: qty 2

## 2019-02-06 NOTE — ED Triage Notes (Addendum)
Pt reports he had a heart cath yesterday at [facility]. Since then he has had multiple episodes of vomiting "can't count", states has tried pedialyte and crackers but still vomiting. Usually triggered by coughing. Pt is a dialysis pt and was dialyzed today. States he is getting Vancomycin during dialysis post toe amputation left foot

## 2019-02-06 NOTE — ED Provider Notes (Signed)
[facility] EMERGENCY DEPARTMENT Provider Note   CSN: [id number] Arrival date & time: 02/06/19  1943     History   Chief Complaint Chief Complaint  Patient presents with  . Emesis    HPI LUCION DILGER is a 53 y.o. male.  HPI  53 year old male presents with vomiting.  He was recently admitted and discharged at [facility] for toe infection and had a toe amputation of his second digit on the left foot.  He is on vancomycin while at dialysis and receive this today after dialysis.  Yesterday he had a heart cath because he is being set up for transplant.  Yesterday afternoon he developed vomiting.  He has been having coughing and then he vomits.  All of this started yesterday afternoon.  Small amount of blood last night but no blood since.  Some lower abdominal pain developing today.  No diarrhea.  He has not known about a fever but was found to be febrile to 101.1 here.  No shortness of breath.  No headache.  No significant pain in his foot or drainage from the wound.  Past Medical History:  Diagnosis Date  . Bacteremia   . Diabetes mellitus   . Dialysis patient ([place])   . Hypertension   . Pneumonia 02/2012   Strep pneumoniae bilateral pneumonia complicated by bacteremia  . Renal disorder    End stage, not on dialysis    Patient Active Problem List   Diagnosis Date Noted  . Elbow pain 07/03/2018  . Nausea & vomiting 05/20/2018  . Acute respiratory failure with hypoxia ([place]) 05/19/2018  . Type II diabetes mellitus with renal manifestations ([place]) 05/19/2018  . Anemia 05/19/2018  . Hypocalcemia 05/19/2018  . HCAP (healthcare-associated pneumonia) 05/19/2018  . ESRD (end stage renal disease) ([place])   . CKD (chronic kidney disease), stage V ([place]) 03/02/2018  . Acute on chronic diastolic CHF (congestive heart failure) ([place]) 03/02/2018  . CAP (community acquired pneumonia) 03/02/2018  . AKI (acute kidney injury) ([place]) 01/29/2017  . Palpitations 01/13/2017    . Essential hypertension, benign 10/26/2013  . Dyspnea 06/09/2012  . Dizziness 06/09/2012  . Pneumonia, organism unspecified([place]) 04/07/2012  . Pleural effusion 04/07/2012  . Parapneumonic effusion 03/28/2012  . Symptomatic anemia 03/28/2012  . Hypoalbuminemia 03/28/2012  . Total bilirubin, elevated 03/28/2012  . Bilateral leg edema 03/23/2012  . Cyst of skin 03/23/2012  . Bacteremia due to Streptococcus pneumoniae 03/15/2012  . Hyponatremia 03/15/2012  . Acute kidney failure 03/13/2012  . Elevated LFTs 03/13/2012  . Pneumococcal pneumonia ([place]) 03/12/2012  . Insulin-requiring or dependent type II diabetes mellitus ([place]) 03/12/2012    Past Surgical History:  Procedure Laterality Date  . AMPUTATION TOE    . AV FISTULA PLACEMENT Left 05/22/2018   Procedure: Creation of BrachiaCephalic Fistula Left Arm;  Surgeon: Angelia Mould, MD;  Location: [facility];  Service: Vascular;  Laterality: Left;  . BASCILIC VEIN TRANSPOSITION Left 11/12/2018   Procedure: BASILIC VEIN TRANSPOSITION LEFT ARM;  Surgeon: Serafina Mitchell, MD;  Location: [facility];  Service: Vascular;  Laterality: Left;  . CHEST TUBE INSERTION     placed during hospitalization 02/2012  . COLONOSCOPY  04/01/2012   Procedure: COLONOSCOPY;  Surgeon: Juanita Craver, MD;  Location[facility] ENDOSCOPY;  Service: Endoscopy;  Laterality: N/A;  . ESOPHAGOGASTRODUODENOSCOPY  03/31/2012   Procedure: ESOPHAGOGASTRODUODENOSCOPY (EGD);  Surgeon: Beryle Beams, MD;  Location: [facility] ENDOSCOPY;  Service: Endoscopy;  Laterality: N/A;  . EXCHANGE OF A DIALYSIS CATHETER Right  05/22/2018   Procedure: EXCHANGE OF A DIALYSIS CATHETER;  Surgeon: Angelia Mould, MD;  Location: [place];  Service: Vascular;  Laterality: Right;  . IR FLUORO GUIDE CV LINE RIGHT  05/20/2018  . IR THROMBECTOMY AV FISTULA W/THROMBOLYSIS INC/SHUNT/IMG LEFT Left 09/10/2018  . IR US GUIDE VASC ACCESS LEFT  09/10/2018  . IR US GUIDE VASC ACCESS RIGHT  05/20/2018        Home  Medications    Prior to Admission medications   Medication Sig Start Date End Date Taking? Authorizing Provider  amLODipine (NORVASC) 10 MG tablet Take 10 mg by mouth daily.  03/21/17   [provider]  aspirin 81 MG tablet Take 81 mg by mouth every morning.     [provider]  b complex-vitamin c-folic acid (NEPHRO-VITE) 0.8 MG TABS tablet Take 1 tablet by mouth daily.    [provider]  calcium elemental as carbonate (TUMS ULTRA 1000) 400 MG chewable tablet Chew 2,000 mg by mouth 3 (three) times daily.    [provider]  carvedilol (COREG) 25 MG tablet Take 1.5 tablets (37.5 mg total) by mouth 2 (two) times daily with a meal. 03/25/17   Troy Sine, MD  Continuous Blood Gluc Sensor (FREESTYLE LIBRE 14 DAY SENSOR) MISC 1 each by Does not apply route every 14 (fourteen) days. 02/02/19   Renato Shin, MD  HYDROcodone-acetaminophen (NORCO) 5-325 MG tablet Take 1 tablet by mouth every 6 (six) hours as needed for moderate pain. 11/12/18   Dagoberto Ligas, PA-C  insulin aspart (NOVOLOG FLEXPEN) 100 UNIT/ML FlexPen 5-15 units before meals Patient taking differently: Inject 1-11 Units into the skin 3 (three) times daily as needed for high blood sugar. Per sliding scale 04/08/17   Elayne Snare, MD  lanthanum (FOSRENOL) 1000 MG chewable tablet Chew 1 tablet (1,000 mg total) by mouth 3 (three) times daily with meals. Patient taking differently: Chew 2,000 mg by mouth 3 (three) times daily with meals.  05/25/18   Domenic Polite, MD  omeprazole (PRILOSEC) 40 MG capsule Take 40 mg by mouth daily.    [provider]  TRESIBA FLEXTOUCH 100 UNIT/ML SOPN FlexTouch Pen INJECT 0.38 MLS (38 UNITS TOTAL) INTO THE SKIN DAILY AT 10 PM. Patient taking differently: Inject 38 Units into the skin at bedtime.  05/16/17   Elayne Snare, MD    Family History Family History  Problem Relation Age of Onset  . Diabetes Maternal Grandmother     Social History Social History    Tobacco Use  . Smoking status: Never Smoker  . Smokeless tobacco: Never Used  Substance Use Topics  . Alcohol use: Yes    Comment: rare   . Drug use: No     Allergies   Penicillins   Review of Systems Review of Systems  Constitutional: Negative for fever.  HENT: Positive for sore throat (from vomiting).   Respiratory: Positive for cough. Negative for shortness of breath.   Cardiovascular: Negative for chest pain.  Gastrointestinal: Positive for abdominal pain, nausea and vomiting. Negative for diarrhea.  Musculoskeletal: Negative for arthralgias.  Skin: Negative for wound.  Neurological: Negative for headaches.  All other systems reviewed and are negative.    Physical Exam Updated Vital Signs BP (!) 142/80 (BP Location: Right Arm)   Pulse 86   Temp (!) 101.1 F (38.4 C) (Tympanic)   Resp 20   Ht 6\' 2"  (1.88 m)   Wt 85.7 kg   SpO2 94%   BMI 24.26 kg/m  Physical Exam Vitals signs and nursing note reviewed.  Constitutional:      General: He is not in acute distress.    Appearance: He is well-developed. He is diaphoretic. He is not ill-appearing.  HENT:     Head: Normocephalic and atraumatic.     Right Ear: External ear normal.     Left Ear: External ear normal.     Nose: Nose normal.  Eyes:     General:        Right eye: No discharge.        Left eye: No discharge.  Neck:     Musculoskeletal: Neck supple.  Cardiovascular:     Rate and Rhythm: Normal rate and regular rhythm.     Heart sounds: Normal heart sounds.  Pulmonary:     Effort: Pulmonary effort is normal.     Breath sounds: Normal breath sounds. No wheezing or rales.  Abdominal:     Palpations: Abdomen is soft.     Tenderness: There is abdominal tenderness in the right lower quadrant, suprapubic area and left lower quadrant.  Skin:    General: Skin is warm.     Comments: Left second toe with intact wound without obvious signs of infection such as redness or warmth. Right wrist wound from  heart cath appears clean, dry, intact.  Neurological:     Mental Status: He is alert.  Psychiatric:        Mood and Affect: Mood is not anxious.      ED Treatments / Results  Labs (all labs ordered are listed, but only abnormal results are displayed) Labs Reviewed  COMPREHENSIVE METABOLIC PANEL - Abnormal; Notable for the following components:      Result Value   Sodium 133 (*)    Glucose, Bld 136 (*)    BUN 43 (*)    Creatinine, Ser 5.58 (*)    AST 116 (*)    ALT 150 (*)    Alkaline Phosphatase 229 (*)    GFR calc non Af Amer 11 (*)    GFR calc Af Amer 12 (*)    All other components within normal limits  CBC WITH DIFFERENTIAL/PLATELET - Abnormal; Notable for the following components:   RBC 4.00 (*)    Hemoglobin 11.7 (*)    HCT 38.1 (*)    Platelets 130 (*)    Neutro Abs 9.0 (*)    Lymphs Abs 0.4 (*)    All other components within normal limits  CULTURE, BLOOD (ROUTINE X 2)  CULTURE, BLOOD (ROUTINE X 2)  LACTIC ACID, PLASMA  LIPASE, BLOOD  CBC WITH DIFFERENTIAL/PLATELET  URINALYSIS, ROUTINE W REFLEX MICROSCOPIC  INFLUENZA PANEL BY PCR (TYPE A & B)    EKG EKG Interpretation  Date/Time:  Saturday February 06 2019 21:17:20 EST Ventricular Rate:  84 PR Interval:    QRS Duration: 166 QT Interval:  433 QTC Calculation: 512 R Axis:   -58 Text Interpretation:  Sinus rhythm Ventricular premature complex Probable left atrial enlargement Nonspecific IVCD with LAD LVH with secondary repolarization abnormality Anterior infarct, old no significant change since May 2019 Confirmed by Sherwood Gambler [phone]) on 02/06/2019 10:35:40 PM   Radiology Ct Abdomen Pelvis Wo Contrast  Result Date: 02/06/2019 CLINICAL DATA:  Abdominal pain, fever. Assess for abscess. Patient had a cardiac catheterization at [facility] yesterday. EXAM: CT ABDOMEN AND PELVIS WITHOUT CONTRAST TECHNIQUE: Multidetector CT imaging of the abdomen and pelvis was performed following the standard protocol  without IV contrast. COMPARISON:  May 20, 2018 FINDINGS: Lower chest: Minimal bilateral pleural effusions are identified. Increased interstitial markings with nodular markings with areas of question calcifications are identified in the bilateral lower lobes. The heart size is mildly enlarged. There is a small pericardial effusion. Hepatobiliary: No focal liver lesion is identified. There is probable sludge in the gallbladder. The biliary tree is normal. Pancreas: Unremarkable. Spleen: Unremarkable. Adrenals/Urinary Tract: The bilateral adrenal glands are normal. Nonobstructing kidney stones are identified bilaterally. There is no hydronephrosis bilaterally. Residual contrast presumably from prior cardiac catheterization is identified in the bladder lumen. Stomach/Bowel: Small hiatal hernia is identified. Stomach is otherwise within normal limits. Appendix appears normal. No evidence of bowel wall thickening, distention, or inflammatory changes. Vascular/Lymphatic: Aortic atherosclerosis. No enlarged abdominal or pelvic lymph nodes. Reproductive: Prostate is unremarkable. Other: Small amount of ascites is identified in the pelvis. Musculoskeletal: No acute abnormality identified. IMPRESSION: No focal intra-abdominal or intrapelvic abscess identified. Small amount of ascites in the pelvis. Nonobstructing stones in both kidneys. Small hiatal hernia. Increased nodular and interstitial markings with areas of calcifications in bilateral lower lobes. Consider further evaluation with a chest CT on outpatient basis. Electronically Signed   By: Abelardo Diesel M.D.   On: 02/06/2019 22:17   Dg Chest 2 View  Result Date: 02/06/2019 CLINICAL DATA:  Coughing and vomiting. EXAM: CHEST - 2 VIEW COMPARISON:  May 22, 2018 FINDINGS: Right central venous line is identified distal tip in superior vena cava unchanged. The heart size is enlarged. There is mild central pulmonary vascular congestion. There is minimal right pleural  effusion. No focal pneumonia is identified. The visualized skeletal structures are stable. IMPRESSION: Cardiomegaly with mild central pulmonary vascular congestion. Minimal right pleural effusion. Electronically Signed   By: Abelardo Diesel M.D.   On: 02/06/2019 22:22   Dg Foot Complete Left  Result Date: 02/06/2019 CLINICAL DATA:  Cough and fever.  History of second toe amputation. EXAM: LEFT FOOT - COMPLETE 3+ VIEW COMPARISON:  None. FINDINGS: There is amputation of the second toe without evidence of osteomyelitis. There is no acute fracture or dislocation identified. IMPRESSION: Partial amputation of the second toe without evidence of osteomyelitis. No acute abnormality noted. Electronically Signed   By: Abelardo Diesel M.D.   On: 02/06/2019 22:23    Procedures Procedures (including critical care time)  Medications Ordered in ED Medications  metroNIDAZOLE (FLAGYL) IVPB 500 mg (0 mg Intravenous Stopped 02/06/19 2333)  ceFEPIme (MAXIPIME) 1 g in sodium chloride 0.9 % 100 mL IVPB (0 g Intravenous Stopped 02/06/19 2140)  ceFEPIme (MAXIPIME) 1 g injection (has no administration in time range)  ondansetron (ZOFRAN) injection 4 mg (4 mg Intravenous Given 02/06/19 2113)  acetaminophen (TYLENOL) tablet 650 mg (650 mg Oral Given 02/06/19 2114)     Initial Impression / Assessment and Plan / ED Course  I have reviewed the triage vital signs and the nursing notes.  Pertinent labs & imaging results that were available during my care of the patient were reviewed by me and considered in my medical decision making (see chart for details).     Patient feels much better after Zofran and Tylenol.  Unclear exact source of his fever though with the cough and the nonspecific lung changes on x-ray and CT, I think you will need treatment for possible pneumonia/respiratory illness.  He does have some abnormal LFTs though he does not have any right upper quadrant abdominal pain.  CT without definitive gallbladder  pathology and there is no ultrasound available today.  I  have discussed that with his indwelling right chest catheter for dialysis, his recent admissions and surgery, and his multiple comorbidities he is at high risk for significant illness including bacteremia.  He seems understand this but does not want to be admitted.  He understands potential deleterious effects of going home without medical care.  I have discussed that while I suggest he be admitted, I will still treat him at home and needs to follow-up as closely as possible in 2 days with his PCP.  His lactate is okay at this time.  No hypotension.  The vomiting has stopped.  I reexamined him and he still has some lower abdominal tenderness but no right upper quadrant tenderness.  Because of his penicillin allergy, it will be difficult to treat for both possible abdominal and lung source.  I will treat with renally dosed Levaquin.  We discussed potential side effects of this, including tendinopathy/rupture.  We discussed strict return precautions and he is advised he could return anytime.  Final Clinical Impressions(s) / ED Diagnoses   Final diagnoses:  Fever, unspecified fever cause  Abnormal LFTs    ED Discharge Orders    None       Sherwood Gambler, MD 02/07/19 [phone]

## 2019-02-06 NOTE — Progress Notes (Signed)
Pharmacy Antibiotic Note  [name] [name] is a 53 y.o. male admitted on 02/06/2019 with sepsis.  Pharmacy has been consulted for aztreonam dosing - Per protocol, patient will be changed to cefepime due to tolerating cephalosporins here multiple times in the past.  ESRD on HD - dialyzed today per RN note and states he has been receiving vancomycin in HD s/p toe ampuation.  Plan: Cefepime 1g IV q24h Flagyl 500mg  IV q8h per EDP Monitor clinical progress, c/s, abx plan/LOT Pre-HD vancomycin level as indicated F/u HD schedule/tolerance inpatient F/u need to continue PTA vancomycin? (patient has been receiving in HD outpatient)    Height: 6\' 2"  (188 cm) Weight: 188 lb 15 oz (85.7 kg) IBW/kg (Calculated) : 82.2  Temp (24hrs), Avg:101.1 F (38.4 C), Min:101.1 F (38.4 C), Max:101.1 F (38.4 C)  No results for input(s): WBC, CREATININE, LATICACIDVEN, VANCOTROUGH, VANCOPEAK, VANCORANDOM, GENTTROUGH, GENTPEAK, GENTRANDOM, TOBRATROUGH, TOBRAPEAK, TOBRARND, AMIKACINPEAK, AMIKACINTROU, AMIKACIN in the last 168 hours.  CrCl cannot be calculated (Patient's most recent lab result is older than the maximum 21 days allowed.).    Allergies  Allergen Reactions  . Penicillins Rash and Other (See Comments)    Tolerating Ceftriaxone (03/12/12) Has patient had a PCN reaction causing immediate rash, facial/tongue/throat swelling, SOB or lightheadedness with hypotension: Yes Has patient had a PCN reaction causing severe rash involving mucus membranes or skin necrosis: Unk Has patient had a PCN reaction that required hospitalization: No Has patient had a PCN reaction occurring within the last 10 years: No If all of the above answers are "NO", then may proceed with Cephalosporin use.     Elicia Lamp, PharmD, BCPS Please check AMION for all [place] contact numbers Clinical Pharmacist 02/06/2019 8:38 PM

## 2019-02-06 NOTE — ED Notes (Signed)
ED Provider at bedside. 

## 2019-02-06 NOTE — ED Notes (Signed)
PO challenge given  

## 2019-02-06 NOTE — ED Notes (Signed)
CBC and lactic acid hemolyzed, delay in redraw as patient is currently in radiology.

## 2019-02-06 NOTE — Discharge Instructions (Addendum)
It was recommended you be admitted to the hospital for further work-up and treatment of your fever.  You are being prescribed antibiotics, but if you develop any new or worsening conditions/symptoms such as severe headache or neck stiffness, trouble breathing, coughing blood, abdominal pain, especially in your right upper abdomen, or any other new/concerning symptoms and you should return to the ER.  Follow-up with your doctor in 2 days.  Your liver function tests are abnormal today, you need these rechecked.  Do not take Tylenol as this is metabolized by the liver.

## 2019-02-06 NOTE — ED Notes (Signed)
Attempted second IV, vein blew.

## 2019-02-07 LAB — INFLUENZA PANEL BY PCR (TYPE A & B)
Influenza A By PCR: POSITIVE — AB
Influenza B By PCR: NEGATIVE

## 2019-02-08 ENCOUNTER — Telehealth (HOSPITAL_COMMUNITY): Payer: Self-pay | Admitting: Emergency Medicine

## 2019-02-08 NOTE — Telephone Encounter (Signed)
8:02 AM I called patient as I saw that is influenza swab is positive. He reports he is mildly improved, no further vomiting but a lot of gas.  No further fevers.  I discussed risk/benefits of Tamiflu and he would like to treat with Tamiflu.  I think this is reasonable given his multiple comorbidities.  I have called in Tamiflu to his pharmacy at [facility] on [address].  This will be renally dosed for dialysis.  He was encouraged to follow very closely with PCP.

## 2019-02-09 DIAGNOSIS — N186 End stage renal disease: Secondary | ICD-10-CM | POA: Diagnosis not present

## 2019-02-09 DIAGNOSIS — N2581 Secondary hyperparathyroidism of renal origin: Secondary | ICD-10-CM | POA: Diagnosis not present

## 2019-02-09 DIAGNOSIS — L03031 Cellulitis of right toe: Secondary | ICD-10-CM | POA: Diagnosis not present

## 2019-02-12 LAB — CULTURE, BLOOD (ROUTINE X 2)
Culture: NO GROWTH
Culture: NO GROWTH

## 2019-02-15 DIAGNOSIS — I5022 Chronic systolic (congestive) heart failure: Secondary | ICD-10-CM | POA: Diagnosis not present

## 2019-02-16 DIAGNOSIS — N186 End stage renal disease: Secondary | ICD-10-CM | POA: Diagnosis not present

## 2019-02-16 DIAGNOSIS — N2581 Secondary hyperparathyroidism of renal origin: Secondary | ICD-10-CM | POA: Diagnosis not present

## 2019-02-16 DIAGNOSIS — D689 Coagulation defect, unspecified: Secondary | ICD-10-CM | POA: Diagnosis not present

## 2019-02-19 DIAGNOSIS — M86172 Other acute osteomyelitis, left ankle and foot: Secondary | ICD-10-CM | POA: Diagnosis not present

## 2019-02-20 DIAGNOSIS — E1022 Type 1 diabetes mellitus with diabetic chronic kidney disease: Secondary | ICD-10-CM | POA: Diagnosis not present

## 2019-02-20 DIAGNOSIS — N186 End stage renal disease: Secondary | ICD-10-CM | POA: Diagnosis not present

## 2019-02-20 DIAGNOSIS — Z992 Dependence on renal dialysis: Secondary | ICD-10-CM | POA: Diagnosis not present

## 2019-02-22 DIAGNOSIS — S98132A Complete traumatic amputation of one left lesser toe, initial encounter: Secondary | ICD-10-CM | POA: Diagnosis not present

## 2019-02-23 DIAGNOSIS — N2581 Secondary hyperparathyroidism of renal origin: Secondary | ICD-10-CM | POA: Diagnosis not present

## 2019-02-23 DIAGNOSIS — D689 Coagulation defect, unspecified: Secondary | ICD-10-CM | POA: Diagnosis not present

## 2019-02-23 DIAGNOSIS — N186 End stage renal disease: Secondary | ICD-10-CM | POA: Diagnosis not present

## 2019-03-02 DIAGNOSIS — N186 End stage renal disease: Secondary | ICD-10-CM | POA: Diagnosis not present

## 2019-03-02 DIAGNOSIS — N2581 Secondary hyperparathyroidism of renal origin: Secondary | ICD-10-CM | POA: Diagnosis not present

## 2019-03-02 DIAGNOSIS — D689 Coagulation defect, unspecified: Secondary | ICD-10-CM | POA: Diagnosis not present

## 2019-03-09 DIAGNOSIS — D689 Coagulation defect, unspecified: Secondary | ICD-10-CM | POA: Diagnosis not present

## 2019-03-09 DIAGNOSIS — N2581 Secondary hyperparathyroidism of renal origin: Secondary | ICD-10-CM | POA: Diagnosis not present

## 2019-03-09 DIAGNOSIS — N186 End stage renal disease: Secondary | ICD-10-CM | POA: Diagnosis not present

## 2019-03-16 DIAGNOSIS — N2581 Secondary hyperparathyroidism of renal origin: Secondary | ICD-10-CM | POA: Diagnosis not present

## 2019-03-16 DIAGNOSIS — D689 Coagulation defect, unspecified: Secondary | ICD-10-CM | POA: Diagnosis not present

## 2019-03-16 DIAGNOSIS — N186 End stage renal disease: Secondary | ICD-10-CM | POA: Diagnosis not present

## 2019-03-19 ENCOUNTER — Encounter (HOSPITAL_COMMUNITY): Payer: 59

## 2019-03-19 ENCOUNTER — Ambulatory Visit (HOSPITAL_COMMUNITY)
Admission: RE | Admit: 2019-03-19 | Discharge: 2019-03-19 | Disposition: A | Discharge status: Home / Self Care | Payer: 59 | Source: Ambulatory Visit | Attending: Surgery | Admitting: Surgery

## 2019-03-19 ENCOUNTER — Ambulatory Visit (INDEPENDENT_AMBULATORY_CARE_PROVIDER_SITE_OTHER): Payer: 59 | Admitting: Family

## 2019-03-19 ENCOUNTER — Encounter: Payer: Self-pay | Admitting: Family

## 2019-03-19 ENCOUNTER — Other Ambulatory Visit: Payer: Self-pay

## 2019-03-19 VITALS — BP 124/72 | HR 71 | Temp 97.4°F | Resp 18 | Ht 74.0 in | Wt 190.0 lb

## 2019-03-19 DIAGNOSIS — N186 End stage renal disease: Secondary | ICD-10-CM

## 2019-03-19 DIAGNOSIS — I770 Arteriovenous fistula, acquired: Secondary | ICD-10-CM

## 2019-03-19 DIAGNOSIS — Z992 Dependence on renal dialysis: Secondary | ICD-10-CM

## 2019-03-19 NOTE — Progress Notes (Signed)
CC: Follow up s/p left basilic vein transposition and ligation of left brachiocephalic fistula    History of Present Illness  [name] [name] is a 53 y.o. (12-13-1966) male who is s/p left basilic vein transposition and ligation of left brachiocephalic fistula on 03-88-82 by Dr. Trula Slade.  Patient has previously undergone a left brachiocephalic fistula which is occluded in the upper arm but patent proximally.  It is not usable for dialysis and he is using a catheter.  He returns today for AVF duplex and evaluation. Pt denies any steal type symptoms in his left UE.   He states he was supposed to have a follow up with Dr. Trula Slade in January 2020 re his AVF, but states he felt it was more important to have his toe infection addressed.   He is s/p amputation left 2nd toe by [facility] in February 2020 for what pt states was an infection in his toe, which he did not want to jeopardize his pending renal/pancreas transplant.  He had a cardiac cath at another facility later in February 2020.   Pt dialyzes T-TH-S via right upper chest TDC at [facility], Dr Lorrene Reid is his nephrologist.    Past Medical History:  Diagnosis Date  . Bacteremia   . Diabetes mellitus   . Dialysis patient ([place])   . Hypertension   . Pneumonia 02/2012   Strep pneumoniae bilateral pneumonia complicated by bacteremia  . Renal disorder    End stage, not on dialysis    Social History Social History   Tobacco Use  . Smoking status: Never Smoker  . Smokeless tobacco: Never Used  Substance Use Topics  . Alcohol use: Yes    Comment: rare   . Drug use: No    Family History Family History  Problem Relation Age of Onset  . Diabetes Maternal Grandmother     Surgical History Past Surgical History:  Procedure Laterality Date  . AMPUTATION TOE    . AV FISTULA PLACEMENT Left 05/22/2018   Procedure: Creation of BrachiaCephalic Fistula Left Arm;  Surgeon: Angelia Mould, MD;  Location:  [place];  Service: Vascular;  Laterality: Left;  . BASCILIC VEIN TRANSPOSITION Left 11/12/2018   Procedure: BASILIC VEIN TRANSPOSITION LEFT ARM;  Surgeon: Serafina Mitchell, MD;  Location: [place];  Service: Vascular;  Laterality: Left;  . CHEST TUBE INSERTION     placed during hospitalization 02/2012  . COLONOSCOPY  04/01/2012   Procedure: COLONOSCOPY;  Surgeon: Juanita Craver, MD;  Location[facility] END[facility]COPY;  Service: Endoscopy;  Laterality: N/A;  . ESOPHAGOGASTRODUODENOSCOPY  03/31/2012   Procedure: ESOPHAGOGASTRODUODENOSCOPY (EGD);  Surgeon: Beryle Beams, MD;  Location: [facility] ENDOSCOPY;  Service: Endoscopy;  Laterality: N/A;  . EXCHANGE OF A DIALYSIS CATHETER Right 05/22/2018   Procedure: EXCHANGE OF A DIALYSIS CATHETER;  Surgeon: Angelia Mould, MD;  Location: [place];  Service: Vascular;  Laterality: Right;  . IR FLUORO GUIDE CV LINE RIGHT  05/20/2018  . IR THROMBECTOMY AV FISTULA W/THROMBOLYSIS INC/SHUNT/IMG LEFT Left 09/10/2018  . IR US GUIDE VASC ACCESS LEFT  09/10/2018  . IR US GUIDE VASC ACCESS RIGHT  05/20/2018    Allergies  Allergen Reactions  . Penicillins Rash and Other (See Comments)    Tolerating Ceftriaxone (03/12/12) Has patient had a PCN reaction causing immediate rash, facial/tongue/throat swelling, SOB or lightheadedness with hypotension: Yes Has patient had a PCN reaction causing severe rash involving mucus membranes or skin necrosis: Unk Has patient had a PCN reaction that  required hospitalization: No Has patient had a PCN reaction occurring within the last 10 years: No If all of the above answers are "NO", then may proceed with Cephalosporin use.     Current Outpatient Medications  Medication Sig Dispense Refill  . amLODipine (NORVASC) 10 MG tablet Take 10 mg by mouth daily.     Marland Kitchen aspirin 81 MG tablet Take 81 mg by mouth every morning.     Marland Kitchen b complex-vitamin c-folic acid (NEPHRO-VITE) 0.8 MG TABS tablet Take 1 tablet by mouth daily.    . calcium elemental as carbonate  (TUMS ULTRA 1000) 400 MG chewable tablet Chew 2,000 mg by mouth 3 (three) times daily.    . carvedilol (COREG) 25 MG tablet Take 1.5 tablets (37.5 mg total) by mouth 2 (two) times daily with a meal. 90 tablet 11  . Continuous Blood Gluc Sensor (FREESTYLE LIBRE 14 DAY SENSOR) MISC 1 each by Does not apply route every 14 (fourteen) days. 3 each 1  . insulin aspart (NOVOLOG FLEXPEN) 100 UNIT/ML FlexPen 5-15 units before meals (Patient taking differently: Inject 1-11 Units into the skin 3 (three) times daily as needed for high blood sugar. Per sliding scale) 45 mL 1  . lanthanum (FOSRENOL) 1000 MG chewable tablet Chew 1 tablet (1,000 mg total) by mouth 3 (three) times daily with meals. (Patient taking differently: Chew 2,000 mg by mouth 3 (three) times daily with meals. ) 90 tablet 0  . omeprazole (PRILOSEC) 40 MG capsule Take 40 mg by mouth daily.    . TRESIBA FLEXTOUCH 100 UNIT/ML SOPN FlexTouch Pen INJECT 0.38 MLS (38 UNITS TOTAL) INTO THE SKIN DAILY AT 10 PM. (Patient taking differently: Inject 38 Units into the skin at bedtime. ) 12 pen 2  . HYDROcodone-acetaminophen (NORCO) 5-325 MG tablet Take 1 tablet by mouth every 6 (six) hours as needed for moderate pain. (Patient not taking: Reported on 03/19/2019) 15 tablet 0  . levofloxacin (LEVAQUIN) 250 MG tablet Take 1 tablet (250 mg total) by mouth every other day. (Patient not taking: Reported on 03/19/2019) 7 tablet 0   No current facility-administered medications for this visit.      REVIEW OF SYSTEMS: see HPI for pertinent positives and negatives    PHYSICAL EXAMINATION:  Vitals:   03/19/19 1343  BP: 124/72  Pulse: 71  Resp: 18  Temp: (!) 97.4 F (36.3 C)  TempSrc: Oral  Weight: 190 lb (86.2 kg)  Height: 6\' 2"  (1.88 m)   Body mass index is 24.39 kg/m.  General: The patient appears his stated age.   HEENT:  No gross abnormalities Pulmonary: Respirations are non-labored Abdomen: Soft and non-tender  Musculoskeletal: There are no  major deformities.   Neurologic: No focal weakness or paresthesias are detected Skin: There are no ulcer or rashes noted. Psychiatric: The patient has normal affect. Cardiovascular: There is a regular rate and rhythm. Left radial pulse is 2+ palpable. Left upper arm AVF with palpable thrill.    Non-Invasive Vascular Imaging  left arm Access Duplex  (Date: 03/19/2019):  Findings: +--------------------+----------+-----------------+--------+ AVF                 PSV (cm/s)Flow Vol (mL/min)Comments +--------------------+----------+-----------------+--------+ Native artery inflow   194          1797                +--------------------+----------+-----------------+--------+ AVF Anastomosis        523                              +--------------------+----------+-----------------+--------+  +------------+----------+-------------+----------+--------+  OUTFLOW VEINPSV (cm/s)Diameter (cm)Depth (cm)Describe +------------+----------+-------------+----------+--------+ Prox UA        227        0.62        0.20            +------------+----------+-------------+----------+--------+ Mid UA         303        0.53        0.21            +------------+----------+-------------+----------+--------+ Dist UA        172        0.64        0.14            +------------+----------+-------------+----------+--------+ AC Fossa       198        0.65        0.23            +------------+----------+-------------+----------+--------+ Velocities in the left cephalic vein increase from 285 to 637 in the mid to proximal upper arm.  Summary: Patent arteriovenous fistula.    Medical Decision Making  [name] [name] is a 53 y.o. male who is s/p left basislic vein transposition and ligation of left brachiocephalic fistula on 74-71-59 by Dr. Trula Slade.  Pt has no steal sx's in his left UE, has a 2+ palpable left radial pulse, and palpable thrill at left upper arm AVF.   I discussed with Dr. Donzetta Matters pt HPI, physical exam results, and the results of pt AVF duplex done today.  He missed his January 2020 follow up appointment, his left upper arm AVF is ready for access. Follow protocol for removing TDC.   Follow up with [facility] as needed.    Clemon Chambers, RN, MSN, FNP-C [facility] Office: [phone]  03/19/2019, 2:15 PM  Clinic MD: Donzetta Matters

## 2019-03-23 DIAGNOSIS — D689 Coagulation defect, unspecified: Secondary | ICD-10-CM | POA: Diagnosis not present

## 2019-03-23 DIAGNOSIS — Z992 Dependence on renal dialysis: Secondary | ICD-10-CM | POA: Diagnosis not present

## 2019-03-23 DIAGNOSIS — E1022 Type 1 diabetes mellitus with diabetic chronic kidney disease: Secondary | ICD-10-CM | POA: Diagnosis not present

## 2019-03-23 DIAGNOSIS — N186 End stage renal disease: Secondary | ICD-10-CM | POA: Diagnosis not present

## 2019-03-23 DIAGNOSIS — N2581 Secondary hyperparathyroidism of renal origin: Secondary | ICD-10-CM | POA: Diagnosis not present

## 2019-03-25 DIAGNOSIS — N186 End stage renal disease: Secondary | ICD-10-CM | POA: Diagnosis not present

## 2019-03-25 DIAGNOSIS — N2581 Secondary hyperparathyroidism of renal origin: Secondary | ICD-10-CM | POA: Diagnosis not present

## 2019-03-25 DIAGNOSIS — D689 Coagulation defect, unspecified: Secondary | ICD-10-CM | POA: Diagnosis not present

## 2019-03-30 DIAGNOSIS — D689 Coagulation defect, unspecified: Secondary | ICD-10-CM | POA: Diagnosis not present

## 2019-03-30 DIAGNOSIS — N186 End stage renal disease: Secondary | ICD-10-CM | POA: Diagnosis not present

## 2019-03-30 DIAGNOSIS — N2581 Secondary hyperparathyroidism of renal origin: Secondary | ICD-10-CM | POA: Diagnosis not present

## 2019-04-06 DIAGNOSIS — N2581 Secondary hyperparathyroidism of renal origin: Secondary | ICD-10-CM | POA: Diagnosis not present

## 2019-04-06 DIAGNOSIS — D689 Coagulation defect, unspecified: Secondary | ICD-10-CM | POA: Diagnosis not present

## 2019-04-06 DIAGNOSIS — N186 End stage renal disease: Secondary | ICD-10-CM | POA: Diagnosis not present

## 2019-04-12 DIAGNOSIS — I871 Compression of vein: Secondary | ICD-10-CM | POA: Diagnosis not present

## 2019-04-12 DIAGNOSIS — T82858A Stenosis of vascular prosthetic devices, implants and grafts, initial encounter: Secondary | ICD-10-CM | POA: Diagnosis not present

## 2019-04-12 DIAGNOSIS — N186 End stage renal disease: Secondary | ICD-10-CM | POA: Diagnosis not present

## 2019-04-13 DIAGNOSIS — N2581 Secondary hyperparathyroidism of renal origin: Secondary | ICD-10-CM | POA: Diagnosis not present

## 2019-04-13 DIAGNOSIS — N186 End stage renal disease: Secondary | ICD-10-CM | POA: Diagnosis not present

## 2019-04-13 DIAGNOSIS — D689 Coagulation defect, unspecified: Secondary | ICD-10-CM | POA: Diagnosis not present

## 2019-04-20 DIAGNOSIS — D631 Anemia in chronic kidney disease: Secondary | ICD-10-CM | POA: Diagnosis not present

## 2019-04-20 DIAGNOSIS — D689 Coagulation defect, unspecified: Secondary | ICD-10-CM | POA: Diagnosis not present

## 2019-04-20 DIAGNOSIS — N186 End stage renal disease: Secondary | ICD-10-CM | POA: Diagnosis not present

## 2019-04-22 DIAGNOSIS — Z992 Dependence on renal dialysis: Secondary | ICD-10-CM | POA: Diagnosis not present

## 2019-04-22 DIAGNOSIS — N186 End stage renal disease: Secondary | ICD-10-CM | POA: Diagnosis not present

## 2019-04-22 DIAGNOSIS — E1022 Type 1 diabetes mellitus with diabetic chronic kidney disease: Secondary | ICD-10-CM | POA: Diagnosis not present

## 2019-04-24 DIAGNOSIS — D689 Coagulation defect, unspecified: Secondary | ICD-10-CM | POA: Diagnosis not present

## 2019-04-24 DIAGNOSIS — N2581 Secondary hyperparathyroidism of renal origin: Secondary | ICD-10-CM | POA: Diagnosis not present

## 2019-04-24 DIAGNOSIS — N186 End stage renal disease: Secondary | ICD-10-CM | POA: Diagnosis not present

## 2019-04-27 DIAGNOSIS — N186 End stage renal disease: Secondary | ICD-10-CM | POA: Diagnosis not present

## 2019-04-27 DIAGNOSIS — D689 Coagulation defect, unspecified: Secondary | ICD-10-CM | POA: Diagnosis not present

## 2019-04-27 DIAGNOSIS — N2581 Secondary hyperparathyroidism of renal origin: Secondary | ICD-10-CM | POA: Diagnosis not present

## 2019-05-11 DIAGNOSIS — M2011 Hallux valgus (acquired), right foot: Secondary | ICD-10-CM | POA: Diagnosis not present

## 2019-05-11 DIAGNOSIS — M2012 Hallux valgus (acquired), left foot: Secondary | ICD-10-CM | POA: Diagnosis not present

## 2019-05-11 DIAGNOSIS — I872 Venous insufficiency (chronic) (peripheral): Secondary | ICD-10-CM | POA: Diagnosis not present

## 2019-09-19 IMAGING — DX DG CHEST 2V
2 series · 2 of 2 positions shown · non-contrast
Comparison: Chest x-rays dated 10/26/2013 and 05/26/2012 and chest
CT dated 06/05/2012

CLINICAL DATA: Cough and chills. Left lower lobe rhonchi and rales.

EXAM:
CHEST - 2 VIEW

[chest pa]
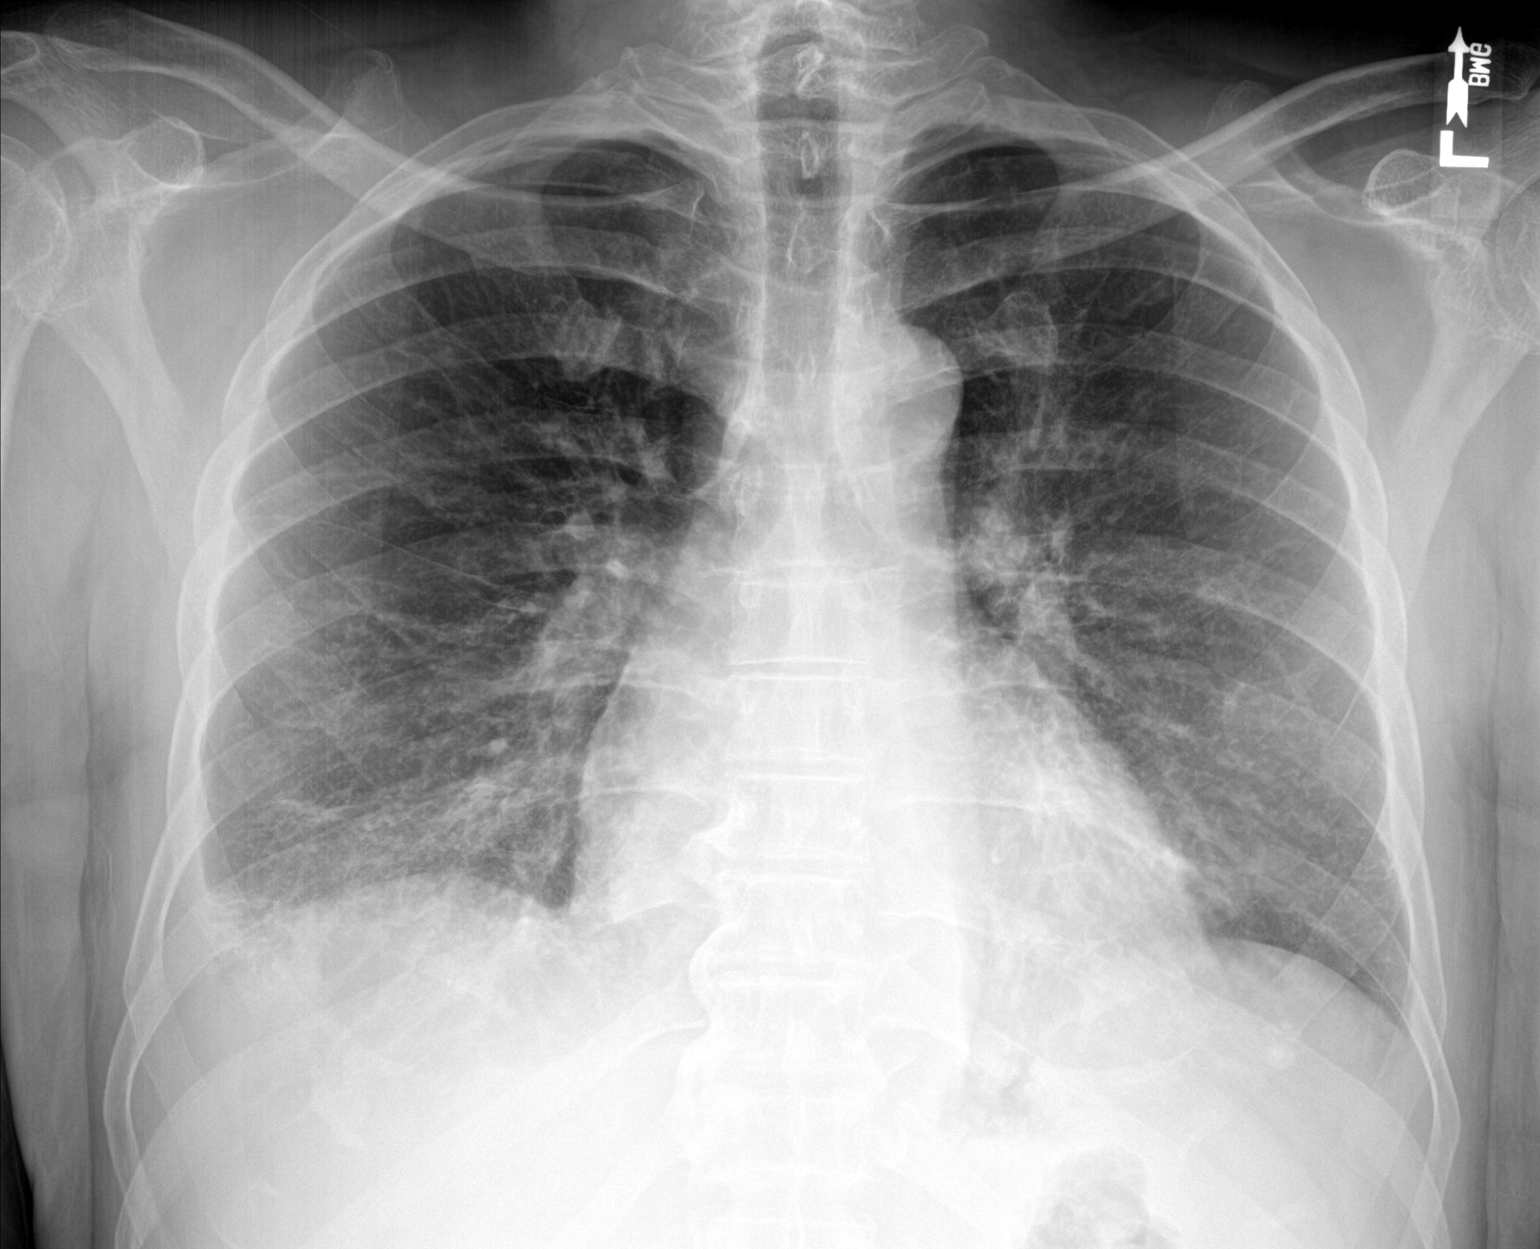

[chest lat]
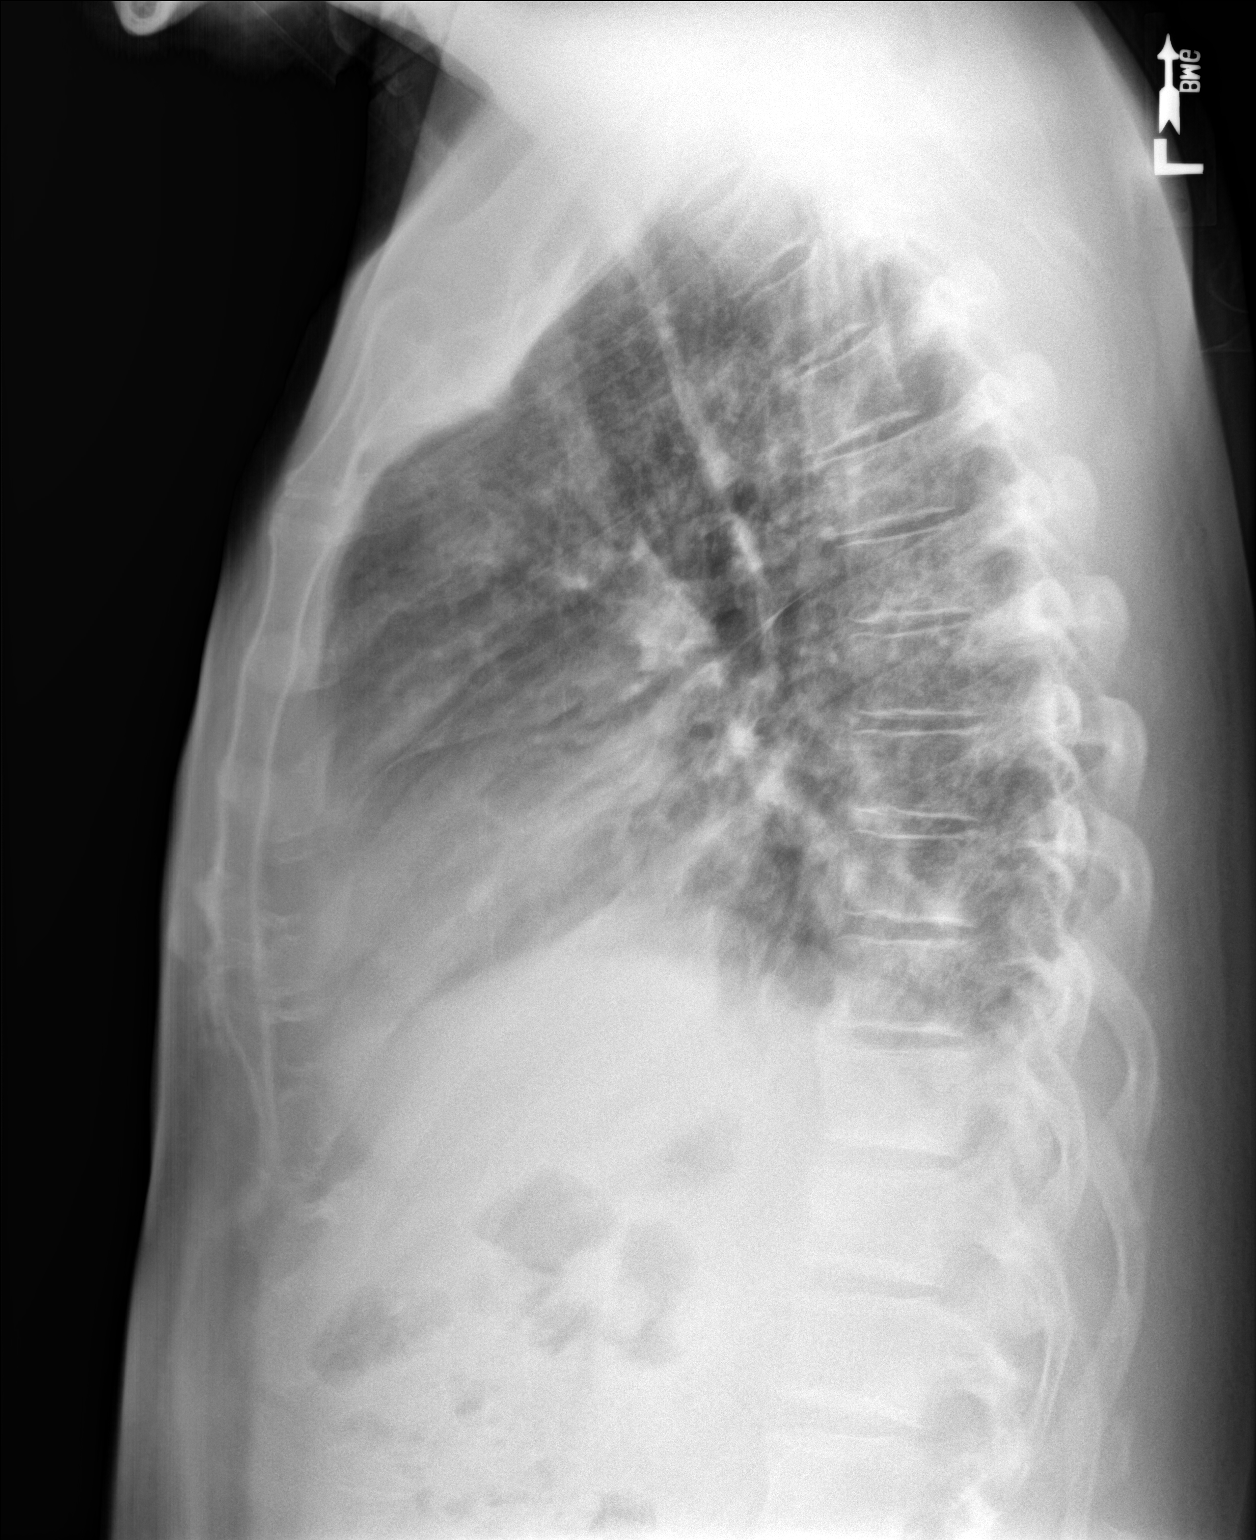

[2 of 2 positions shown; findings below may reference images not displayed]

FINDINGS: Heart size is normal although increased since the prior exam.
Pulmonary vascularity is normal.

Patient has progressive interstitial disease in the right lung base
and new slight interstitial disease at the left lung base. There is
a new small right effusion.

No significant bone abnormality.
IMPRESSION: 1. Bibasilar interstitial lung disease, right greater than left,
nonspecific. This could represent pneumonitis. The
2. New small right pleural effusion.
3. Slight increase in cardiac size although still within normal
limits.

## 2019-11-04 ENCOUNTER — Other Ambulatory Visit: Payer: Self-pay | Admitting: Orthopedic Surgery

## 2019-11-04 DIAGNOSIS — M545 Low back pain, unspecified: Secondary | ICD-10-CM

## 2019-12-07 ENCOUNTER — Other Ambulatory Visit: Payer: Self-pay | Admitting: Endocrinology

## 2019-12-07 DIAGNOSIS — E1165 Type 2 diabetes mellitus with hyperglycemia: Secondary | ICD-10-CM

## 2019-12-07 DIAGNOSIS — Z794 Long term (current) use of insulin: Secondary | ICD-10-CM

## 2019-12-08 ENCOUNTER — Other Ambulatory Visit (INDEPENDENT_AMBULATORY_CARE_PROVIDER_SITE_OTHER): Payer: 59

## 2019-12-08 ENCOUNTER — Other Ambulatory Visit: Payer: Self-pay

## 2019-12-08 DIAGNOSIS — Z794 Long term (current) use of insulin: Secondary | ICD-10-CM

## 2019-12-08 DIAGNOSIS — E1165 Type 2 diabetes mellitus with hyperglycemia: Secondary | ICD-10-CM

## 2019-12-08 LAB — LIPID PANEL
Cholesterol: 198 mg/dL (ref 0–200)
HDL: 41.6 mg/dL (ref >39.00)
LDL Cholesterol: 125 mg/dL — ABNORMAL HIGH (ref 0–99)
NonHDL: 155.96
Total CHOL/HDL Ratio: 5
Triglycerides: 155 mg/dL — ABNORMAL HIGH (ref 0.0–149.0)
VLDL: 31 mg/dL (ref 0.0–40.0)

## 2019-12-08 LAB — HEMOGLOBIN A1C: Hgb A1c MFr Bld: 8.6 % — ABNORMAL HIGH (ref 4.6–6.5)

## 2019-12-08 LAB — GLUCOSE, RANDOM: Glucose, Bld: 246 mg/dL — ABNORMAL HIGH (ref 70–99)

## 2019-12-09 LAB — FRUCTOSAMINE: Fructosamine: 598 umol/L — ABNORMAL HIGH (ref 0–285)

## 2019-12-10 ENCOUNTER — Ambulatory Visit (INDEPENDENT_AMBULATORY_CARE_PROVIDER_SITE_OTHER): Payer: 59 | Admitting: Endocrinology

## 2019-12-10 ENCOUNTER — Encounter: Payer: Self-pay | Admitting: Endocrinology

## 2019-12-10 VITALS — BP 114/64 | HR 69 | Temp 98.4°F | Ht 74.0 in | Wt 196.2 lb

## 2019-12-10 DIAGNOSIS — E113599 Type 2 diabetes mellitus with proliferative diabetic retinopathy without macular edema, unspecified eye: Secondary | ICD-10-CM

## 2019-12-10 DIAGNOSIS — Z794 Long term (current) use of insulin: Secondary | ICD-10-CM | POA: Diagnosis not present

## 2019-12-10 DIAGNOSIS — E7800 Pure hypercholesterolemia, unspecified: Secondary | ICD-10-CM

## 2019-12-10 DIAGNOSIS — E1165 Type 2 diabetes mellitus with hyperglycemia: Secondary | ICD-10-CM

## 2019-12-10 MED ORDER — FREESTYLE LIBRE 2 READER SYSTM DEVI: 1.0000 | Freq: Once | 0 refills | Status: AC

## 2019-12-10 MED ORDER — ATORVASTATIN CALCIUM 10 MG PO TABS: 10.0000 mg | ORAL_TABLET | Freq: Every day | ORAL | 3 refills | Status: DC

## 2019-12-10 MED ORDER — FREESTYLE LIBRE 2 SENSOR SYSTM MISC: 1.0000 [IU] | Freq: Once | 3 refills | Status: DC

## 2019-12-10 MED ORDER — TRESIBA FLEXTOUCH 100 UNIT/ML ~~LOC~~ SOPN: 34.0000 [IU] | PEN_INJECTOR | Freq: Every day | SUBCUTANEOUS | 0 refills | Status: DC

## 2019-12-10 MED ORDER — NOVOLOG FLEXPEN 100 UNIT/ML ~~LOC~~ SOPN: PEN_INJECTOR | SUBCUTANEOUS | 1 refills | Status: DC

## 2019-12-10 NOTE — Progress Notes (Signed)
Patient ID: [name] [name], male   DOB: [birth year], 53 y.o.   MRN: [id number]            Reason for Appointment: Follow-up for Type 2 Diabetes   History of Present Illness:          Date of diagnosis of type 2 diabetes mellitus: 2001       Background history:   He was started on insulin 2 or 3 years after his initial diagnosis He was on various insulin regimens, mostly Lantus and NovoLog and at some point was also on NPH and regular His blood sugar control has been typically poor with A1c as high as 15.7  Recent history:   INSULIN regimen is:  Currently none Previously on  38 Tresiba at bedtime, NovoLog: 5-10 units at breakfast and 11-15 units at suppertime  He has not followed up since 03/2017  He works night shifts, 12 hours after 7 PM  Non-insulin hypoglycemic drugs the patient is taking are: None  Current management, blood sugar patterns and problems identified:  His A1c is higher at 8.6, previously as low as 6.3   He did not bring his monitor for download today  Because of lack of insurance he stopped taking his insulin a couple of months ago  He only takes some Novolin R from [place] with a syringe  Has not checked blood sugars much lately and lab glucose fasting was 246  Otherwise he thinks with [place] his blood sugars in the mornings were below 100  Also he will sometimes start feeling his blood sugar dropped low around his lunchtime at work at midnight or so  Not clear what his blood sugars are after his evening meal but does not think they were over 200  However he thinks that his blood sugars were relatively well controlled when he was taking basal bolus insulin  Also previously was checking his blood sugars more regularly with freestyle libre but only for short time       No recent weight change  Glucose monitoring:  None recently      Blood Glucose readings by recall as above   Self-care: The diet that the patient has been following is:  tries to limit high-fat foods.     Typical meal intake: Breakfast is generally cream of wheat, egg toast at [place] visit, 11/2017               Exercise:  none, his work is sedentary  Weight history:  Wt Readings from Last 3 Encounters:  12/10/19 196 lb 3.2 oz (89 kg)  03/19/19 190 lb (86.2 kg)  02/06/19 188 lb 15 oz (85.7 kg)    Glycemic control:   Lab Results  Component Value Date   HGBA1C 8.6 (H) 12/08/2019   HGBA1C 7.2 (H) 05/20/2018   HGBA1C 6.3 03/28/2017   Lab Results  Component Value Date   MICROALBUR 63.31 (H) 03/13/2012   LDLCALC 125 (H) 12/08/2019   CREATININE 5.58 (H) 02/06/2019   Lab Results  Component Value Date   MICRALBCREAT 614.7 (H) 03/13/2012    Lab Results  Component Value Date   FRUCTOSAMINE 598 (H) 12/08/2019   FRUCTOSAMINE 318 (H) 06/05/2017      Allergies as of 12/10/2019      Reactions   Penicillins Rash, Other (See Comments)   Tolerating Ceftriaxone (03/12/12) Has patient had a PCN reaction causing immediate rash,  facial/tongue/throat swelling, SOB or lightheadedness with hypotension: Yes Has patient had a PCN reaction causing severe rash involving mucus membranes or skin necrosis: Unk Has patient had a PCN reaction that required hospitalization: No Has patient had a PCN reaction occurring within the last 10 years: No If all of the above answers are "NO", then may proceed with Cephalosporin use.      Medication List       Accurate as of December 10, 2019 11:59 PM. If you have any questions, ask your nurse or doctor.        amLODipine 10 MG tablet Commonly known as: NORVASC Take 10 mg by mouth daily.   aspirin 81 MG tablet Take 81 mg by mouth every morning.   atorvastatin 10 MG tablet Commonly known as: LIPITOR Take 1 tablet (10 mg total) by mouth daily. Started by: Elayne Snare, MD   b complex-vitamin c-folic acid 0.8 MG Tabs tablet Take 1 tablet by mouth daily.   carvedilol 25 MG tablet Commonly  known as: COREG Take 1.5 tablets (37.5 mg total) by mouth 2 (two) times daily with a meal.   FreeStyle Libre 14 Day Sensor Misc 1 each by Does not apply route every 14 (fourteen) days. What changed: Another medication with the same name was added. Make sure you understand how and when to take each. Changed by: Elayne Snare, MD   FreeStyle Libre 2 Sensor Systm Misc 1 Units by Does not apply route once for 1 dose. What changed: You were already taking a medication with the same name, and this prescription was added. Make sure you understand how and when to take each. Changed by: Elayne Snare, MD   FreeStyle Libre 2 Reader [facility] 1 Device by Does not apply route once for 1 dose. Started by: Elayne Snare, MD   HYDROcodone-acetaminophen 5-325 MG tablet Commonly known as: Norco Take 1 tablet by mouth every 6 (six) hours as needed for moderate pain.   lanthanum 1000 MG chewable tablet Commonly known as: FOSRENOL Chew 1 tablet (1,000 mg total) by mouth 3 (three) times daily with meals. What changed: how much to take   levofloxacin 250 MG tablet Commonly known as: LEVAQUIN Take 1 tablet (250 mg total) by mouth every other day.   NovoLOG FlexPen 100 UNIT/ML FlexPen Generic drug: insulin aspart 5 to 7 units for breakfast and lunch and 10 to 14 units at dinner based on meal size and carbohydrateS What changed: additional instructions Changed by: Elayne Snare, MD   omeprazole 40 MG capsule Commonly known as: PRILOSEC Take 40 mg by mouth daily.   Tyler Aas FlexTouch 100 UNIT/ML Sopn FlexTouch Pen Generic drug: insulin degludec Inject 0.34 mLs (34 Units total) into the skin daily. What changed:   how much to take  when to take this Changed by: Elayne Snare, MD   Tums Ultra 1000 400 MG chewable tablet Generic drug: calcium elemental as carbonate Chew 2,000 mg by mouth 3 (three) times daily.       Allergies:  Allergies  Allergen Reactions  . Penicillins Rash and Other (See  Comments)    Tolerating Ceftriaxone (03/12/12) Has patient had a PCN reaction causing immediate rash, facial/tongue/throat swelling, SOB or lightheadedness with hypotension: Yes Has patient had a PCN reaction causing severe rash involving mucus membranes or skin necrosis: Unk Has patient had a PCN reaction that required hospitalization: No Has patient had a PCN reaction occurring within the last 10 years: No If all of the above answers are "NO",  then may proceed with Cephalosporin use.     Past Medical History:  Diagnosis Date  . Bacteremia   . Diabetes mellitus   . Dialysis patient ([place])   . Hypertension   . Pneumonia 02/2012   Strep pneumoniae bilateral pneumonia complicated by bacteremia  . Renal disorder    End stage, not on dialysis    Past Surgical History:  Procedure Laterality Date  . AMPUTATION TOE    . AV FISTULA PLACEMENT Left 05/22/2018   Procedure: Creation of BrachiaCephalic Fistula Left Arm;  Surgeon: Angelia Mould, MD;  Location: [place];  Service: Vascular;  Laterality: Left;  . BASCILIC VEIN TRANSPOSITION Left 11/12/2018   Procedure: BASILIC VEIN TRANSPOSITION LEFT ARM;  Surgeon: Serafina Mitchell, MD;  Location: [place];  Service: Vascular;  Laterality: Left;  . CHEST TUBE INSERTION     placed during hospitalization 02/2012  . COLONOSCOPY  04/01/2012   Procedure: COLONOSCOPY;  Surgeon: Juanita Craver, MD;  Location: [facility] ENDOSCOPY;  Service: Endoscopy;  Laterality: N/A;  . ESOPHAGOGASTRODUODENOSCOPY  03/31/2012   Procedure: ESOPHAGOGASTRODUODENOSCOPY (EGD);  Surgeon: Beryle Beams, MD;  Location: [facility] ENDOSCOPY;  Service: Endoscopy;  Laterality: N/A;  . EXCHANGE OF A DIALYSIS CATHETER Right 05/22/2018   Procedure: EXCHANGE OF A DIALYSIS CATHETER;  Surgeon: Angelia Mould, MD;  Location: [place];  Service: Vascular;  Laterality: Right;  . IR FLUORO GUIDE CV LINE RIGHT  05/20/2018  . IR THROMBECTOMY AV FISTULA W/THROMBOLYSIS INC/SHUNT/IMG LEFT Left 09/10/2018  . IR  US GUIDE VASC ACCESS LEFT  09/10/2018  . IR US GUIDE VASC ACCESS RIGHT  05/20/2018    Family History  Problem Relation Age of Onset  . Diabetes Maternal Grandmother     Social History:  reports that he has never smoked. He has never used smokeless tobacco. He reports current alcohol use. He reports that he does not use drugs.   Review of Systems  Constitutional: Negative for weight loss.  Neurological: Positive for numbness.       Has some numbness in his feet    Lipid history: No treatment currently for hyperlipidemia, previously had taken atorvastatin No history of CAD    Lab Results  Component Value Date   CHOL 198 12/08/2019   HDL 41.60 12/08/2019   LDLCALC 125 (H) 12/08/2019   TRIG 155.0 (H) 12/08/2019   CHOLHDL 5 12/08/2019           Hypertension:Treated with carvedilol and amlodipine followed by nephrologist Has history of CHF, taking Entresto  BP Readings from Last 3 Encounters:  12/10/19 114/64  03/19/19 124/72  02/06/19 (!) 142/85     Most recent eye exam was In early 2017, has had an intervention done by a retina surgeon  Has had amputation of the left toe  Complications of diabetes: Neuropathy, Nephropathy, proliferative retinopathy  LABS:  Lab on 12/08/2019  Component Date Value Ref Range Status  . Cholesterol 12/08/2019 198  0 - 200 mg/dL Final   ATP III Classification       Desirable:  < 200 mg/dL               Borderline High:  200 - 239 mg/dL          High:  > = 240 mg/dL  . Triglycerides 12/08/2019 155.0* 0.0 - 149.0 mg/dL Final   Normal:  <150 mg/dLBorderline High:  150 - 199 mg/dL  . HDL 12/08/2019 41.60  >39.00 mg/dL Final  . VLDL 12/08/2019 31.0  0.0 -  40.0 mg/dL Final  . LDL Cholesterol 12/08/2019 125* 0 - 99 mg/dL Final  . Total CHOL/HDL Ratio 12/08/2019 5   Final                  Men          Women1/2 Average Risk     3.4          3.3Average Risk          5.0          4.42X Average Risk          9.6          7.13X Average Risk           15.0          11.0                      . NonHDL 12/08/2019 155.96   Final   NOTE:  Non-HDL goal should be 30 mg/dL higher than patient's LDL goal (i.e. LDL goal of < 70 mg/dL, would have non-HDL goal of < 100 mg/dL)  . Glucose, Bld 12/08/2019 246* 70 - 99 mg/dL Final  . Hgb A1c MFr Bld 12/08/2019 8.6* 4.6 - 6.5 % Final   Glycemic Control Guidelines for People with Diabetes:Non Diabetic:  <6%Goal of Therapy: <7%Additional Action Suggested:  >8%   . Fructosamine 12/08/2019 598* 0 - 285 umol/L Final   Comment: Published reference interval for apparently healthy subjects between age 39 and 27 is 80 - 285 umol/L and in a poorly controlled diabetic population is 228 - 563 umol/L with a mean of 396 umol/L.     Physical Examination:  BP 114/64 (BP Location: Right Arm, Patient Position: Sitting, Cuff Size: Normal)   Pulse 69   Temp 98.4 F (36.9 C)   Ht 6\' 2"  (1.88 m)   Wt 196 lb 3.2 oz (89 kg)   SpO2 98%   BMI 25.19 kg/m   No ankle edema present      ASSESSMENT:  Diabetes type 2, uncontrolled, insulin-dependent for several years  See history of present illness for detailed discussion of current diabetes management, blood sugar patterns and problems identified  A1c is 8.6  He is coming back after 2-1/2 years for follow-up and currently not on insulin per He does need to get back on his basal bolus insulin regimen and his management with insulin, monitoring with freestyle libre, achievement of blood sugar targets, need for consistent coverage of all meals with mealtime shots was discussed in detail  Adjustment of basal insulin based on fasting readings was also discussed including avoidance of hypoglycemia which previously was likely from excessive basal insulin when he switched from Lantus to [place]  RENAL failure on dialysis  HYPERTENSION: Appears adequately controlled, followed by cardiologist and nephrologist  PLAN:    Restart basal bolus insulin regimen  He will take  [place] which he believes can be covered by his insurance and an additional co-pay card also given  He can start with 34 units since he likely had tendency to hypoglycemia with 38 units previously  Given flow chart with detailed explanation and instructions on how to titrate this every 3 days x 2 units up or down to get blood sugars between 90-130  Restart NOVOLOG with each meal  Have discussed in detail the need for mealtime insulin to cover postprandial spikes, action of mealtime insulin,timing and action of the rapid acting insulin as well as dosage  titration to target the two-hour reading glucose of under 180  Since no blood sugar pattern data available currently will empirically put him on similar NovoLog doses as before with 5 to 7 units at breakfast and lunch and 10-14 at dinnertime  He does need to take insulin for covering his lunch including at work  [employer] needs to be used again and he will start using the #2 version of the CGM which will be prescribed to help assess his blood sugar patterns at different times of the day  Regular walking for exercise  ATORVASTATIN 10 mg daily for hyperlipidemia  Consultation with ophthalmologist to be done, referral will be made  Patient Instructions  Adjust Tresiba by 2 units of her down based on blood sugars on waking up every 3 to 4 days Start with 24 units  NovoLog will be adjusted based on content of carbohydrates, also add extra 2 to 3 units with higher fat meal.  Blood sugar 2 to 3 hours later should be about 150   Counseling time on subjects discussed in assessment and plan sections is over 50% of today's 25 minute visit  Elayne Snare 12/12/2019, 11:34 AM   Note: This office note was prepared with Dragon voice recognition system technology. Any transcriptional errors that result from this process are unintentional.

## 2019-12-10 NOTE — Patient Instructions (Signed)
Adjust Tresiba by 2 units of her down based on blood sugars on waking up every 3 to 4 days Start with 24 units  NovoLog will be adjusted based on content of carbohydrates, also add extra 2 to 3 units with higher fat meal.  Blood sugar 2 to 3 hours later should be about 150

## 2019-12-14 ENCOUNTER — Telehealth: Payer: Self-pay | Admitting: Endocrinology

## 2019-12-14 ENCOUNTER — Other Ambulatory Visit: Payer: Self-pay

## 2019-12-14 MED ORDER — FREESTYLE LIBRE 14 DAY SENSOR MISC: 1.0000 | 2 refills | Status: DC

## 2019-12-14 MED ORDER — FREESTYLE LIBRE 14 DAY READER DEVI: 1.0000 | 0 refills | Status: DC

## 2019-12-14 NOTE — Telephone Encounter (Signed)
Are you okay with this?

## 2019-12-14 NOTE — Telephone Encounter (Signed)
We can give him the written prescriptions as requested

## 2019-12-14 NOTE — Telephone Encounter (Signed)
Rx printed for pt

## 2019-12-14 NOTE — Telephone Encounter (Signed)
Patient called re: Patient's insurance does not cover [employer] 2 (cost is $200). Patient requests a new RX written on paper for patient to pick up for the regular Freestyle Libre 14 day Reader and Sensor-does not need automatic notification-too expensive. Please call patient at ph# [phone] when written RX is ready for patient to pick up.

## 2019-12-30 ENCOUNTER — Other Ambulatory Visit: Payer: 59

## 2020-01-12 ENCOUNTER — Other Ambulatory Visit: Payer: Self-pay | Admitting: Endocrinology

## 2020-01-26 ENCOUNTER — Ambulatory Visit: Payer: 59 | Admitting: Endocrinology

## 2020-01-30 ENCOUNTER — Other Ambulatory Visit: Payer: Self-pay | Admitting: Endocrinology

## 2020-02-14 ENCOUNTER — Other Ambulatory Visit: Payer: 59

## 2020-02-18 ENCOUNTER — Ambulatory Visit: Payer: 59 | Admitting: Endocrinology

## 2020-03-10 ENCOUNTER — Telehealth: Payer: Self-pay | Admitting: Endocrinology
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
## 2020-03-10 NOTE — Telephone Encounter (Signed)
It is not the receiver but the reader

## 2020-03-13 ENCOUNTER — Other Ambulatory Visit: Payer: Self-pay

## 2020-03-13 MED ORDER — FREESTYLE LIBRE 14 DAY READER DEVI: 1.0000 | 0 refills | Status: AC

## 2020-03-13 MED ORDER — FREESTYLE LIBRE 14 DAY READER DEVI: 1.0000 | 0 refills | Status: DC

## 2020-03-13 NOTE — Telephone Encounter (Signed)
Rx for reader has been sent.

## 2020-03-20 ENCOUNTER — Other Ambulatory Visit: Payer: 59

## 2020-03-22 ENCOUNTER — Telehealth (HOSPITAL_COMMUNITY): Payer: Self-pay | Admitting: Vascular Surgery

## 2020-03-22 ENCOUNTER — Ambulatory Visit: Payer: 59 | Admitting: Endocrinology

## 2020-03-22 NOTE — Telephone Encounter (Signed)
Called pt to make new pt appt , pt mailbox is full , will send pt a letter, with appt date and time

## 2020-03-25 ENCOUNTER — Ambulatory Visit: Payer: 59 | Attending: Internal Medicine

## 2020-03-25 DIAGNOSIS — Z23 Encounter for immunization: Secondary | ICD-10-CM

## 2020-03-25 NOTE — Progress Notes (Signed)
   Covid-19 Vaccination Clinic  Name:  [name] [name]    MRN: [id number] DOB: 20-May-1966  03/25/2020  [name] was observed post Covid-19 immunization for 15 minutes without incident. He was provided with Vaccine Information Sheet and instruction to access the V-Safe system.   [name] was instructed to call 911 with any severe reactions post vaccine: Marland Kitchen Difficulty breathing  . Swelling of face and throat  . A fast heartbeat  . A bad rash all over body  . Dizziness and weakness   Immunizations Administered    Name Date Dose VIS Date Route   Pfizer COVID-19 Vaccine 03/25/2020  1:04 PM 0.3 mL 12/03/2019 Intramuscular   Manufacturer: [place]   Lot: [id number]   [place]: [id number]

## 2020-03-27 ENCOUNTER — Ambulatory Visit: Payer: 59 | Admitting: Endocrinology

## 2020-03-29 IMAGING — US IR THROMBECTOMY AV FISTULA W/THROMBOLYSIS/PTA INC/SHUNT/IMG*L*
1 series · 4 of 4 positions shown · non-contrast
Comparison: none

CLINICAL DATA: End-stage renal disease with thrombosis of left
upper arm brachiocephalic dialysis AV fistula. This fistula was
placed on 05/22/2018. Patient also has a functioning right-sided
tunneled dialysis catheter in place.

[Series 1: ir thrombectomy av fistula w/thrombolysis/pta inc/ · 4 of 4 slices shown]
[im 1/4]
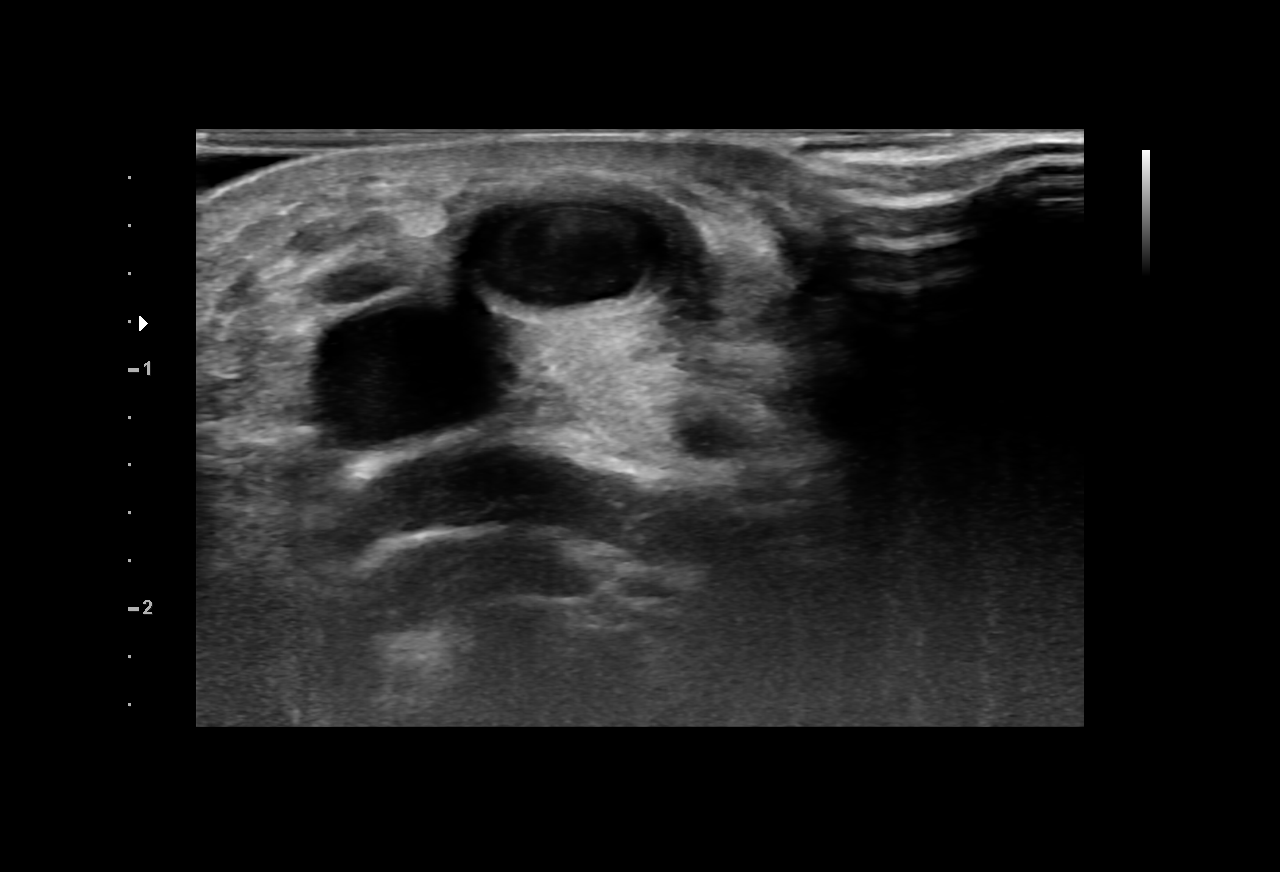
[im 2/4]
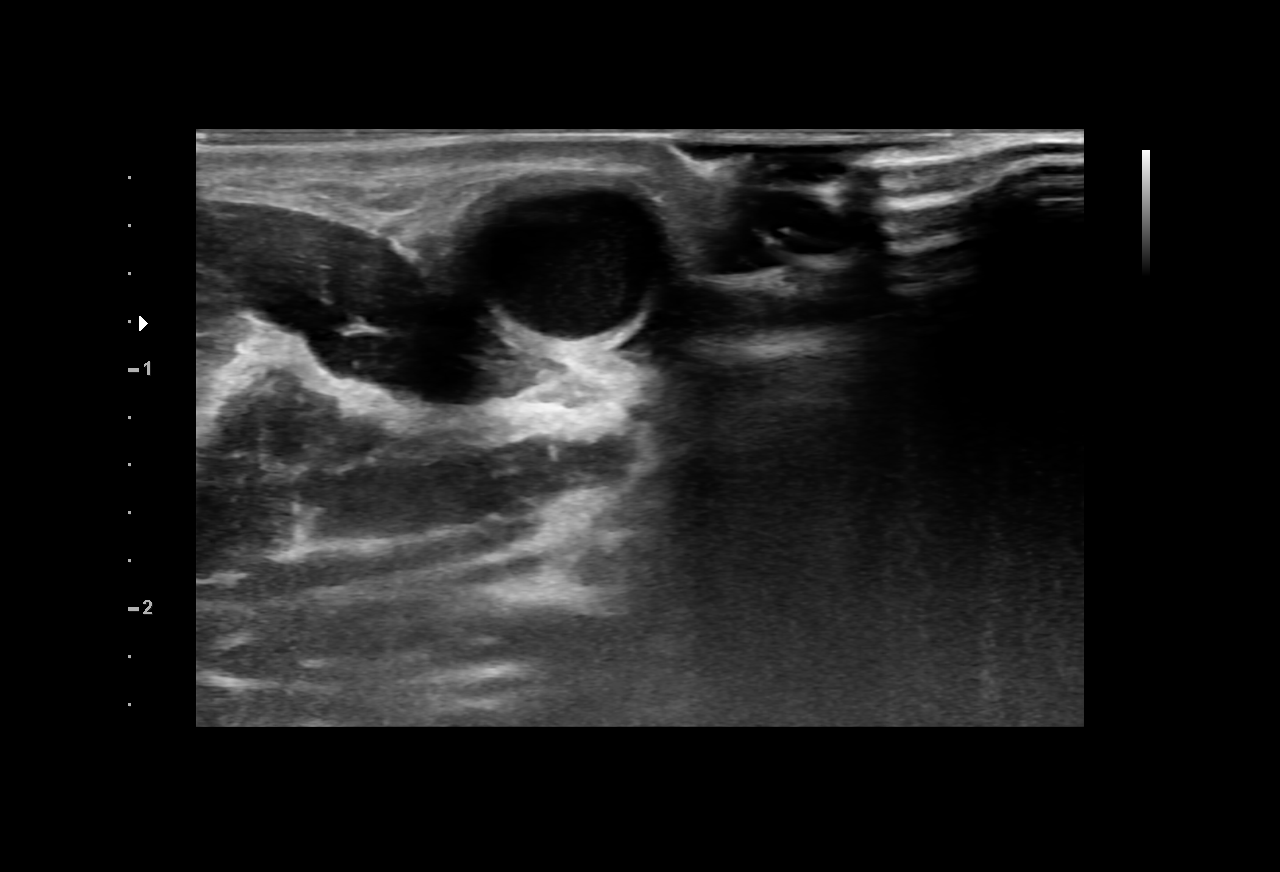
[im 3/4]
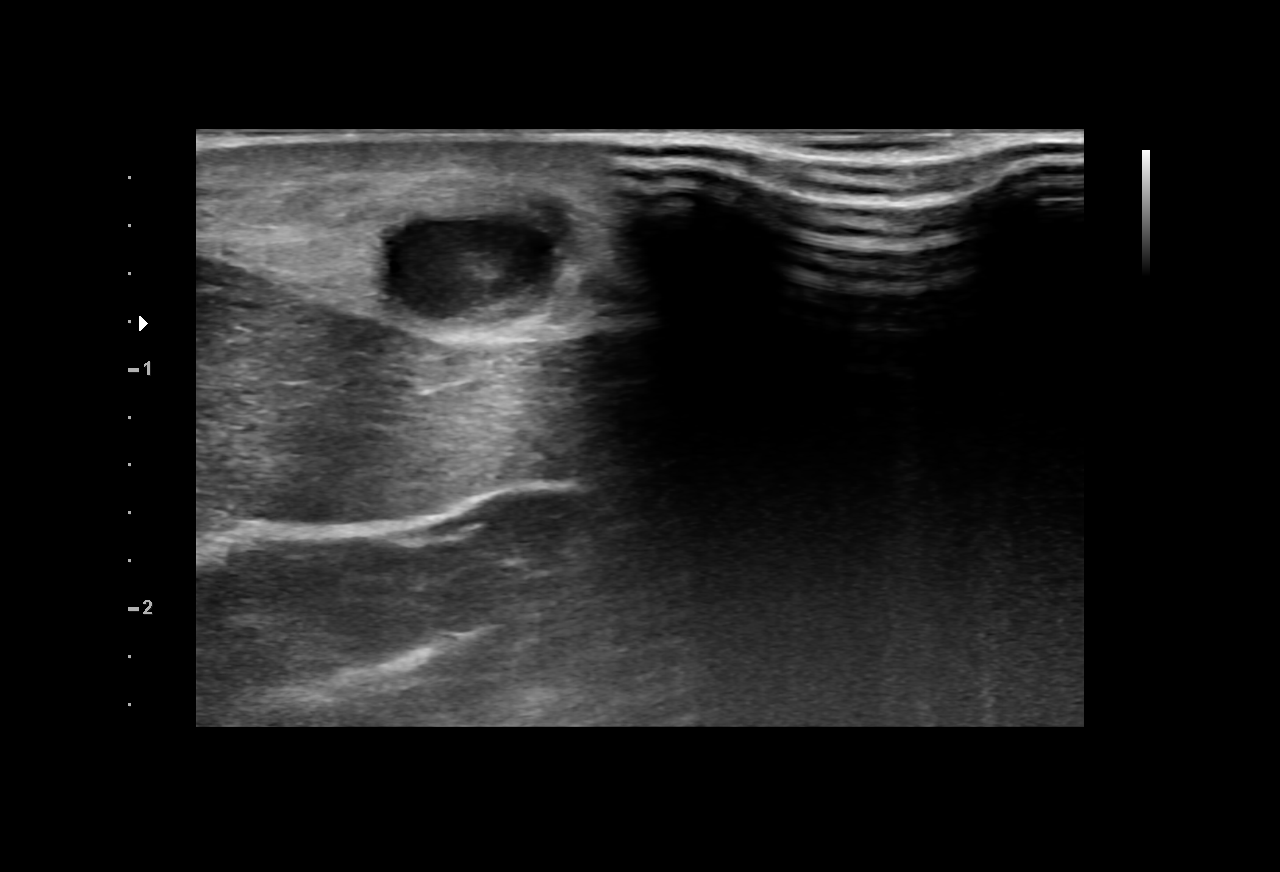
[im 4/4]
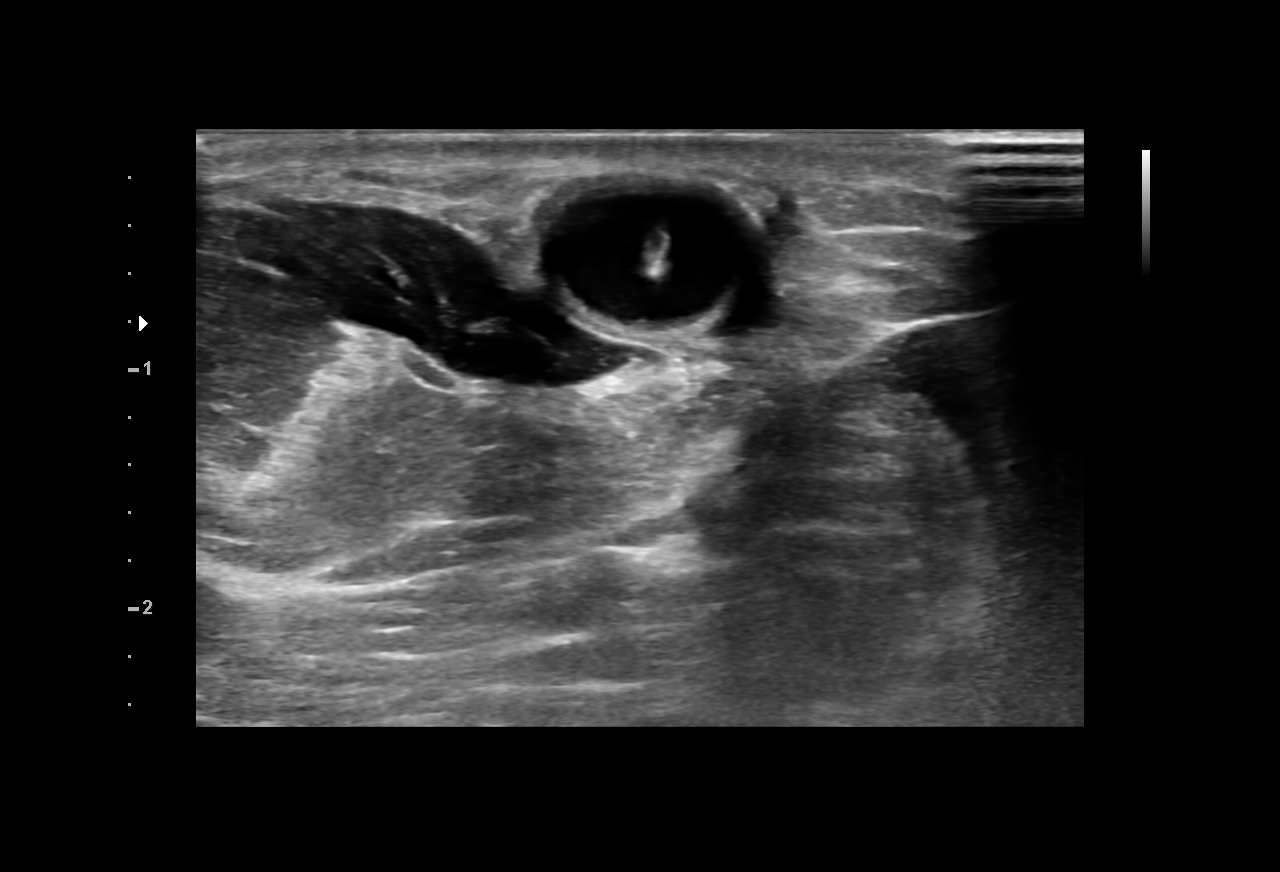

[4 of 4 positions shown; findings below may reference images not displayed]

EXAM:
1. ULTRASOUND GUIDANCE FOR VASCULAR ACCESS OF DIALYSIS FISTULA.
2. ATTEMPTED DIALYSIS FISTULA DECLOT PROCEDURE WITH ANTEGRADE
FISTULA ACCESS.

ANESTHESIA/SEDATION:
No IV conscious sedation was administered per request of the
patient.

CONTRAST:  20 mL Esovue-NZZ

MEDICATIONS:
2 mg tPA

FLUOROSCOPY TIME:  2 minutes and 43 seconds.  5.0 mGy.

PROCEDURE:
The procedure, risks, benefits, and alternatives were explained to
the patient. Questions regarding the procedure were encouraged and
answered. The patient understands and consents to the procedure. A
time-out was performed prior to initiating the procedure.

The left upper arm dialysis fistula was prepped with chlorhexidine
in a sterile fashion, and a sterile drape was applied covering the
operative field. A sterile gown and sterile gloves were used for the
procedure. Local anesthesia was provided with 1% Lidocaine.

Preliminary ultrasound was performed of the dialysis fistula.
Antegrade fistula access was performed with a micropuncture set
under direct ultrasound guidance. Ultrasound image documentation was
performed. t-PA was instilled via a micropuncture dilator and a 6
French antegrade sheath placed. A diagnostic catheter was advanced
and contrast injection performed at the level of venous outflow.
Angiography was performed through the catheter.

The catheter and sheath were removed. Hemostasis was obtained by
application of an Ethilon pursestring suture and manual compression.

COMPLICATIONS:
None
FINDINGS: By ultrasound, the arterial anastomosis between the brachial artery
and cephalic vein is normally patent as is the initial segment of
the cephalic vein. Thrombosis of the venous outflow begins
approximately 3-4 cm distal to the arterial anastomosis.

After access of the cephalic vein, there was un obstruction met by
both catheter and guidewire at the level of the mid humerus which
could not be crossed by a diagnostic catheter or various guidewires.
Angiography shows abrupt chronic occlusion of the vein at this level
with no flow of contrast beyond this segment. With angiography,
there is retrograde opacification of a branch vein of the cephalic
vein near the antecubital fossa that eventually reconstitutes venous
outflow in the deep venous system. The cephalic vein in the upper
arm is very small in caliber.
IMPRESSION: Aborted declot procedure of left upper arm brachiocephalic AV
fistula due to abrupt chronic occlusion of a small cephalic vein in
the mid left upper arm. This occlusion could not be crossed with any
catheter or guidewire combination.

ACCESS:
Recommend referral to vascular surgery for assessment for new
dialysis access. Given that the initial segment of the
brachiocephalic fistula is currently patent, a basilic transposition
may be potentially feasible.

## 2020-04-10 ENCOUNTER — Other Ambulatory Visit (INDEPENDENT_AMBULATORY_CARE_PROVIDER_SITE_OTHER): Payer: 59

## 2020-04-10 ENCOUNTER — Other Ambulatory Visit: Payer: Self-pay

## 2020-04-10 ENCOUNTER — Other Ambulatory Visit: Payer: Self-pay | Admitting: Endocrinology

## 2020-04-10 DIAGNOSIS — E7800 Pure hypercholesterolemia, unspecified: Secondary | ICD-10-CM

## 2020-04-10 DIAGNOSIS — Z794 Long term (current) use of insulin: Secondary | ICD-10-CM | POA: Diagnosis not present

## 2020-04-10 DIAGNOSIS — E1165 Type 2 diabetes mellitus with hyperglycemia: Secondary | ICD-10-CM | POA: Diagnosis not present

## 2020-04-10 LAB — LIPID PANEL
Cholesterol: 159 mg/dL (ref 0–200)
HDL: 40.9 mg/dL (ref >39.00)
LDL Cholesterol: 97 mg/dL (ref 0–99)
NonHDL: 118.16
Total CHOL/HDL Ratio: 4
Triglycerides: 104 mg/dL (ref 0.0–149.0)
VLDL: 20.8 mg/dL (ref 0.0–40.0)

## 2020-04-10 LAB — GLUCOSE, RANDOM: Glucose, Bld: 159 mg/dL — ABNORMAL HIGH (ref 70–99)

## 2020-04-10 LAB — HEMOGLOBIN A1C: Hgb A1c MFr Bld: 6.7 % — ABNORMAL HIGH (ref 4.6–6.5)

## 2020-04-11 LAB — FRUCTOSAMINE: Fructosamine: 446 umol/L — ABNORMAL HIGH (ref 0–285)

## 2020-04-12 ENCOUNTER — Ambulatory Visit: Payer: 59 | Admitting: Endocrinology

## 2020-04-12 ENCOUNTER — Other Ambulatory Visit: Payer: Self-pay

## 2020-04-12 ENCOUNTER — Encounter: Payer: Self-pay | Admitting: Endocrinology

## 2020-04-12 VITALS — BP 136/72 | HR 77 | Ht 74.0 in | Wt 194.8 lb

## 2020-04-12 DIAGNOSIS — E7800 Pure hypercholesterolemia, unspecified: Secondary | ICD-10-CM

## 2020-04-12 DIAGNOSIS — Z794 Long term (current) use of insulin: Secondary | ICD-10-CM

## 2020-04-12 DIAGNOSIS — E1165 Type 2 diabetes mellitus with hyperglycemia: Secondary | ICD-10-CM

## 2020-04-12 LAB — GLUCOSE, POCT (MANUAL RESULT ENTRY): POC Glucose: 215 mg/dl — AB (ref 70–99)

## 2020-04-12 MED ORDER — FREESTYLE PRECISION NEO TEST VI STRP: ORAL_STRIP | 0 refills | Status: DC

## 2020-04-12 MED ORDER — TRESIBA FLEXTOUCH 100 UNIT/ML ~~LOC~~ SOPN: 38.0000 [IU] | PEN_INJECTOR | Freq: Every day | SUBCUTANEOUS | 1 refills | Status: DC

## 2020-04-12 NOTE — Patient Instructions (Signed)
Freestyle neo for fingersticks

## 2020-04-12 NOTE — Progress Notes (Signed)
Patient ID: [name] [name], male   DOB: [birth year], 54 y.o.   MRN: [id number]            Reason for Appointment: Follow-up for Type 2 Diabetes   History of Present Illness:          Date of diagnosis of type 2 diabetes mellitus: 2001       Background history:   He was started on insulin 2 or 3 years after his initial diagnosis He was on various insulin regimens, mostly Lantus and NovoLog and at some point was also on NPH and regular His blood sugar control has been typically poor with A1c as high as 15.7  Recent history:   INSULIN regimen is:  38 Tresiba at bedtime, NovoLog: 5 units at breakfast and 5-8 units at suppertime  He works night shifts, 12 hours after 7 PM  Non-insulin hypoglycemic drugs the patient is taking are: None  Current management, blood sugar patterns and problems identified:  His A1c is back to 6.7 compared to 8.6, previously as low as 6.3   He is using the freestyle libre version 2  This appears to be reading significantly lower than the actual reading but he has not been aware of this and has not checked his fingersticks  He is asymptomatic even when his blood sugars are reading as low as 40 in the morning  Today in the office his blood sugar was 215 and on his home sensor it was about 157  He does not always take his NovoLog including today when his blood sugars are low normal or low  This is despite his eating significant amount of carbohydrate like bagels or oatmeal with juice in the morning  Also appears to be taking less NovoLog than he used to take previously  He has started exercising at the gym       No recent weight change  Glucose monitoring:  With freestyle libre  CGM use % of time  49  2-week average/SD  104  Time in range      67%  % Time Above 180  6  % Time above 250   % Time Below 70  27     PRE-MEAL Fasting Lunch Dinner Bedtime Overall  Glucose range:       Averages:  76  80  102     POST-MEAL PC Breakfast PC  Lunch PC Dinner  Glucose range:     Averages:  85 ?  131      Self-care: The diet that the patient has been following is: tries to limit high-fat foods.     Typical meal intake: Breakfast is generally cream of wheat, egg toast at 8am, dinner 5 pm                Dietician visit, 11/2017               Exercise:     Weight history:  Wt Readings from Last 3 Encounters:  04/12/20 194 lb 12.8 oz (88.4 kg)  12/10/19 196 lb 3.2 oz (89 kg)  03/19/19 190 lb (86.2 kg)    Glycemic control:   Lab Results  Component Value Date   HGBA1C 6.7 (H) 04/10/2020   HGBA1C 8.6 (H) 12/08/2019   HGBA1C 7.2 (H) 05/20/2018   Lab Results  Component Value Date   MICROALBUR 63.31 (H) 03/13/2012   LDLCALC 97 04/10/2020   CREATININE 5.58 (H) 02/06/2019   Lab Results  Component Value Date  MICRALBCREAT 614.7 (H) 03/13/2012    Lab Results  Component Value Date   FRUCTOSAMINE 446 (H) 04/10/2020   FRUCTOSAMINE 598 (H) 12/08/2019   FRUCTOSAMINE 318 (H) 06/05/2017      Allergies as of 04/12/2020      Reactions   Penicillins Rash, Other (See Comments)   Tolerating Ceftriaxone (03/12/12) Has patient had a PCN reaction causing immediate rash, facial/tongue/throat swelling, SOB or lightheadedness with hypotension: Yes Has patient had a PCN reaction causing severe rash involving mucus membranes or skin necrosis: Unk Has patient had a PCN reaction that required hospitalization: No Has patient had a PCN reaction occurring within the last 10 years: No If all of the above answers are "NO", then may proceed with Cephalosporin use.      Medication List       Accurate as of April 12, 2020  1:02 PM. If you have any questions, ask your nurse or doctor.        STOP taking these medications   amLODipine 10 MG tablet Commonly known as: NORVASC Stopped by: Elayne Snare, MD   levofloxacin 250 MG tablet Commonly known as: LEVAQUIN Stopped by: Elayne Snare, MD   omeprazole 40 MG capsule Commonly known  as: PRILOSEC Stopped by: Elayne Snare, MD     TAKE these medications   aspirin 81 MG tablet Take 81 mg by mouth every morning.   atorvastatin 10 MG tablet Commonly known as: LIPITOR Take 1 tablet (10 mg total) by mouth daily.   b complex-vitamin c-folic acid 0.8 MG Tabs tablet Take 1 tablet by mouth daily.   carvedilol 25 MG tablet Commonly known as: COREG Take 1.5 tablets (37.5 mg total) by mouth 2 (two) times daily with a meal.   Entresto 24-26 MG Generic drug: sacubitril-valsartan Take 1 tablet by mouth 2 (two) times daily.   ethyl chloride spray Apply 1 application topically 3 (three) times a week. Apply 1 spray to skin three times weekly.   FreeStyle Libre 14 Day Reader Kerrin Mo 1 each by Does not apply route See admin instructions. Use to monitor blood sugars.   FreeStyle Libre 2 Sensor Misc USE AS DIRECTED   FreeStyle Precision Neo Test test strip Generic drug: glucose blood Use Freestyle Neo test strips as instructed to check blood sugar four times daily. What changed:   how much to take  how to take this  when to take this Changed by: Jayme Cloud, LPN   gabapentin 417 MG capsule Commonly known as: NEURONTIN Take 100 mg by mouth 3 (three) times daily as needed.   HYDROcodone-acetaminophen 5-325 MG tablet Commonly known as: Norco Take 1 tablet by mouth every 6 (six) hours as needed for moderate pain.   ivabradine 5 MG Tabs tablet Commonly known as: CORLANOR Take 5 mg by mouth 2 (two) times daily with a meal.   lanthanum 1000 MG chewable tablet Commonly known as: FOSRENOL Chew 1 tablet (1,000 mg total) by mouth 3 (three) times daily with meals. What changed: how much to take   NovoLOG FlexPen 100 UNIT/ML FlexPen Generic drug: insulin aspart 5 to 7 units for breakfast and lunch and 10 to 14 units at dinner based on meal size and carbohydrateS   Patiromer Sorbitex Calcium 16.8 g Pack Take 16.8 g by mouth daily.   Tyler Aas FlexTouch 100 UNIT/ML  FlexTouch Pen Generic drug: insulin degludec Inject 0.38 mLs (38 Units total) into the skin daily. What changed: Another medication with the same name was removed. Continue taking this  medication, and follow the directions you see here. Changed by: Elayne Snare, MD   Tums Ultra 1000 400 MG chewable tablet Generic drug: calcium elemental as carbonate Chew 2,000 mg by mouth 3 (three) times daily.   Vitamin D (Ergocalciferol) 1.25 MG (50000 UNIT) Caps capsule Commonly known as: DRISDOL Take 50,000 Units by mouth every 7 (seven) days.       Allergies:  Allergies  Allergen Reactions  . Penicillins Rash and Other (See Comments)    Tolerating Ceftriaxone (03/12/12) Has patient had a PCN reaction causing immediate rash, facial/tongue/throat swelling, SOB or lightheadedness with hypotension: Yes Has patient had a PCN reaction causing severe rash involving mucus membranes or skin necrosis: Unk Has patient had a PCN reaction that required hospitalization: No Has patient had a PCN reaction occurring within the last 10 years: No If all of the above answers are "NO", then may proceed with Cephalosporin use.     Past Medical History:  Diagnosis Date  . Bacteremia   . Diabetes mellitus   . Dialysis patient ([place])   . Hypertension   . Pneumonia 02/2012   Strep pneumoniae bilateral pneumonia complicated by bacteremia  . Renal disorder    End stage, not on dialysis    Past Surgical History:  Procedure Laterality Date  . AMPUTATION TOE    . AV FISTULA PLACEMENT Left 05/22/2018   Procedure: Creation of BrachiaCephalic Fistula Left Arm;  Surgeon: Angelia Mould, MD;  Location: [place];  Service: Vascular;  Laterality: Left;  . BASCILIC VEIN TRANSPOSITION Left 11/12/2018   Procedure: BASILIC VEIN TRANSPOSITION LEFT ARM;  Surgeon: Serafina Mitchell, MD;  Location: [place];  Service: Vascular;  Laterality: Left;  . CHEST TUBE INSERTION     placed during hospitalization 02/2012  . COLONOSCOPY   04/01/2012   Procedure: COLONOSCOPY;  Surgeon: Juanita Craver, MD;  [facility] ENDOSCOPY;  Service: Endoscopy;  Laterality: N/A;  . ESOPHAGOGASTRODUODENOSCOPY  03/31/2012   Procedure: ESOPHAGOGASTRODUODENOSCOPY (EGD);  Surgeon: Beryle Beams, MD;  Location: [facility] ENDOSCOPY;  Service: Endoscopy;  Laterality: N/A;  . EXCHANGE OF A DIALYSIS CATHETER Right 05/22/2018   Procedure: EXCHANGE OF A DIALYSIS CATHETER;  Surgeon: Angelia Mould, MD;  Location: [place];  Service: Vascular;  Laterality: Right;  . IR FLUORO GUIDE CV LINE RIGHT  05/20/2018  . IR THROMBECTOMY AV FISTULA W/THROMBOLYSIS INC/SHUNT/IMG LEFT Left 09/10/2018  . IR US GUIDE VASC ACCESS LEFT  09/10/2018  . IR US GUIDE VASC ACCESS RIGHT  05/20/2018    Family History  Problem Relation Age of Onset  . Diabetes Maternal Grandmother     Social History:  reports that he has never smoked. He has never used smokeless tobacco. He reports current alcohol use. He reports that he does not use drugs.   Review of Systems  Lipid history: No treatment currently for hyperlipidemia, previously had taken atorvastatin No history of CAD    Lab Results  Component Value Date   CHOL 159 04/10/2020   HDL 40.90 04/10/2020   LDLCALC 97 04/10/2020   TRIG 104.0 04/10/2020   CHOLHDL 4 04/10/2020           Hypertension:Treated with carvedilol and amlodipine followed by nephrologist Has history of CHF, taking Entresto  BP Readings from Last 3 Encounters:  04/12/20 136/72  12/10/19 114/64  03/19/19 124/72     Most recent eye exam was reportedly in 2020 but no records available, has had an intervention done by a retina surgeon  Has had amputation  of the left toe  Complications of diabetes: Neuropathy, Nephropathy, proliferative retinopathy  LABS:  Office Visit on 04/12/2020  Component Date Value Ref Range Status  . POC Glucose 04/12/2020 215* 70 - 99 mg/dl Final  Lab on 04/10/2020  Component Date Value Ref Range Status  . Cholesterol  04/10/2020 159  0 - 200 mg/dL Final   ATP III Classification       Desirable:  < 200 mg/dL               Borderline High:  200 - 239 mg/dL          High:  > = 240 mg/dL  . Triglycerides 04/10/2020 104.0  0.0 - 149.0 mg/dL Final   Normal:  <150 mg/dLBorderline High:  150 - 199 mg/dL  . HDL 04/10/2020 40.90  >39.00 mg/dL Final  . VLDL 04/10/2020 20.8  0.0 - 40.0 mg/dL Final  . LDL Cholesterol 04/10/2020 97  0 - 99 mg/dL Final  . Total CHOL/HDL Ratio 04/10/2020 4   Final                  Men          Women1/2 Average Risk     3.4          3.3Average Risk          5.0          4.42X Average Risk          9.6          7.13X Average Risk          15.0          11.0                      . NonHDL 04/10/2020 118.16   Final   NOTE:  Non-HDL goal should be 30 mg/dL higher than patient's LDL goal (i.e. LDL goal of < 70 mg/dL, would have non-HDL goal of < 100 mg/dL)  . Fructosamine 04/10/2020 446* 0 - 285 umol/L Final   Comment: Published reference interval for apparently healthy subjects between age 39 and 46 is 24 - 285 umol/L and in a poorly controlled diabetic population is 228 - 563 umol/L with a mean of 396 umol/L.   Marland Kitchen Glucose, Bld 04/10/2020 159* 70 - 99 mg/dL Final  . Hgb A1c MFr Bld 04/10/2020 6.7* 4.6 - 6.5 % Final   Glycemic Control Guidelines for People with Diabetes:Non Diabetic:  <6%Goal of Therapy: <7%Additional Action Suggested:  >8%     Physical Examination:  BP 136/72 (BP Location: Right Arm, Patient Position: Sitting, Cuff Size: Normal)   Pulse 77   Ht 6\' 2"  (1.88 m)   Wt 194 lb 12.8 oz (88.4 kg)   SpO2 98%   BMI 25.01 kg/m   No ankle edema present      ASSESSMENT:  Diabetes type 2, uncontrolled, insulin-dependent for several years  See history of present illness for detailed discussion of current diabetes management, blood sugar patterns and problems identified  A1c is 6.7, previously 8.6 However fructosamine is 446  He is coming back after about 5 months  He is  back on his insulin with basal bolus regimen However even though his A1c looks excellent he is getting falsely low readings with his freestyle libre Blood sugar is in the office about 50 mg lower than the actual reading Also although his overnight blood sugars appear to be the lowest he has  not had any symptoms of hypoglycemia  Discussed A1c and fructosamine levels and his actual blood sugars as well as blood sugar targets  LIPIDS: Better controlled with starting atorvastatin 10 mg and he will continue the same dose  HYPERTENSION: It is controlled, followed by cardiologist and nephrologist  PLAN:    Restart checking fingersticks with a FreeStyle meter using his sensor reader  He will call the company to see why he has difficulties with accuracy  In the meantime he can reduce the alarms for low readings to be below 60 instead of 70  He will adjust his Tyler Aas once he has more reliable blood sugar readings and keep morning sugars between 80-120  Discussed that he needs to take NovoLog with every meal regardless of blood sugar reading since he is eating carbohydrates at most meals including breakfast  Most likely he will need to adjust his suppertime dose based on the readings 2 hours later  Likely needs to reduce carbohydrate in the morning if postprandial readings are continuing to be high  Consider using Dexcom or guardian sensor if freestyle libre still continues to be inaccurate and once he is able to afford it, discussed the differences  Patient Instructions  Freestyle neo for fingersticks     Elayne Snare 04/12/2020, 1:02 PM   Note: This office note was prepared with Dragon voice recognition system technology. Any transcriptional errors that result from this process are unintentional.

## 2020-04-13 ENCOUNTER — Other Ambulatory Visit: Payer: Self-pay | Admitting: Endocrinology

## 2020-04-14 ENCOUNTER — Other Ambulatory Visit: Payer: Self-pay

## 2020-04-14 ENCOUNTER — Other Ambulatory Visit: Payer: Self-pay | Admitting: Endocrinology

## 2020-04-14 ENCOUNTER — Telehealth: Payer: Self-pay

## 2020-04-14 MED ORDER — FREESTYLE PRECISION NEO TEST VI STRP: ORAL_STRIP | 2 refills | Status: DC

## 2020-04-14 NOTE — Telephone Encounter (Signed)
PA initiated via [email or web address] for Freestyle neo test strips to use in freestyle libre meter to verify accuracy of libre meter when scanning blood sugar readings.  Amear Scavone Key: [id number] help? Call us at [phone] Status Sent to [employer] FreeStyle Precision Neo Test strips Form [employer] Electronic PA Form [phone] NCPDP)  Your demographic data has been sent to [place] successfully. They will respond shortly with your clinical questions and you will be notified by email when available. You can also check for an update later by opening this request from your dashboard. Please do not fax or call [employer] to resubmit this request.  If you need assistance, please chat with [employer] or call us at [phone].

## 2020-04-19 ENCOUNTER — Ambulatory Visit: Payer: 59 | Attending: Internal Medicine

## 2020-04-19 DIAGNOSIS — Z23 Encounter for immunization: Secondary | ICD-10-CM

## 2020-04-19 NOTE — Progress Notes (Signed)
   Covid-19 Vaccination Clinic  Name:  NIKLAUS MAMARIL    MRN: [id number] DOB: May 27, 1966  04/19/2020  Mr. Steinbach was observed post Covid-19 immunization for 15 minutes without incident. He was provided with Vaccine Information Sheet and instruction to access the V-Safe system.   Mr. Sherrin was instructed to call 911 with any severe reactions post vaccine: Marland Kitchen Difficulty breathing  . Swelling of face and throat  . A fast heartbeat  . A bad rash all over body  . Dizziness and weakness   Immunizations Administered    Name Date Dose VIS Date Route   Pfizer COVID-19 Vaccine 04/19/2020  9:51 AM 0.3 mL 02/16/2019 Intramuscular   Manufacturer: [place]   Lot: [id number]   [place]: [id number]

## 2020-04-20 NOTE — Telephone Encounter (Signed)
Received fax from [employer] stating that PA for Freestyle neo test strips to use with Freestyle Libre 2 for comparison to sensors was denied because patient does not have an Omnipod. Would you like to provide different meter and strips to use for comparison?

## 2020-04-20 NOTE — Telephone Encounter (Signed)
Please send One Touch Verio meter and strips checking twice a day

## 2020-04-21 ENCOUNTER — Other Ambulatory Visit: Payer: Self-pay

## 2020-04-21 MED ORDER — GLUCOSE BLOOD VI STRP: ORAL_STRIP | 2 refills | Status: AC

## 2020-04-21 MED ORDER — ONETOUCH VERIO FLEX SYSTEM W/DEVICE KIT: PACK | 0 refills | Status: AC

## 2020-04-21 MED ORDER — ONETOUCH DELICA LANCETS 30G MISC: 2 refills | Status: AC

## 2020-04-21 NOTE — Telephone Encounter (Signed)
Sent Rx for onetouch verio meter and supplies and called pt and left detailed voicemail informing him of this as well as the reason for sending the new meter.

## 2020-05-05 ENCOUNTER — Encounter (HOSPITAL_COMMUNITY): Payer: 59 | Admitting: Cardiology

## 2020-06-19 ENCOUNTER — Other Ambulatory Visit: Payer: 59

## 2020-06-23 ENCOUNTER — Ambulatory Visit: Payer: 59 | Admitting: Endocrinology

## 2020-06-23 DIAGNOSIS — Z0289 Encounter for other administrative examinations: Secondary | ICD-10-CM

## 2020-06-23 NOTE — Progress Notes (Deleted)
Patient ID: [name] [name], male   DOB: [birth year], 54 y.o.   MRN: [id number]            Reason for Appointment: Follow-up for Type 2 Diabetes   History of Present Illness:          Date of diagnosis of type 2 diabetes mellitus: 2001       Background history:   He was started on insulin 2 or 3 years after his initial diagnosis He was on various insulin regimens, mostly Lantus and NovoLog and at some point was also on NPH and regular His blood sugar control has been typically poor with A1c as high as 15.7  Recent history:   INSULIN regimen is:  38 Tresiba at bedtime, NovoLog: 5 units at breakfast and 5-8 units at suppertime  He works night shifts, 12 hours after 7 PM  Non-insulin hypoglycemic drugs the patient is taking are: None  Current management, blood sugar patterns and problems identified:  His A1c is back to 6.7 compared to 8.6, previously as low as 6.3   He is using the freestyle libre version 2  This appears to be reading significantly lower than the actual reading but he has not been aware of this and has not checked his fingersticks  He is asymptomatic even when his blood sugars are reading as low as 40 in the morning  Today in the office his blood sugar was 215 and on his home sensor it was about 157  He does not always take his NovoLog including today when his blood sugars are low normal or low  This is despite his eating significant amount of carbohydrate like bagels or oatmeal with juice in the morning  Also appears to be taking less NovoLog than he used to take previously  He has started exercising at the gym       No recent weight change  Glucose monitoring:  With freestyle libre  CGM use % of time  49  2-week average/SD  104  Time in range      67%  % Time Above 180  6  % Time above 250   % Time Below 70  27     PRE-MEAL Fasting Lunch Dinner Bedtime Overall  Glucose range:       Averages:  76  80  102     POST-MEAL PC Breakfast PC  Lunch PC Dinner  Glucose range:     Averages:  85 ?  131      Self-care: The diet that the patient has been following is: tries to limit high-fat foods.     Typical meal intake: Breakfast is generally cream of wheat, egg toast at 8am, dinner 5 pm                Dietician visit, 11/2017               Exercise:     Weight history:  Wt Readings from Last 3 Encounters:  04/12/20 194 lb 12.8 oz (88.4 kg)  12/10/19 196 lb 3.2 oz (89 kg)  03/19/19 190 lb (86.2 kg)    Glycemic control:   Lab Results  Component Value Date   HGBA1C 6.7 (H) 04/10/2020   HGBA1C 8.6 (H) 12/08/2019   HGBA1C 7.2 (H) 05/20/2018   Lab Results  Component Value Date   MICROALBUR 63.31 (H) 03/13/2012   LDLCALC 97 04/10/2020   CREATININE 5.58 (H) 02/06/2019   Lab Results  Component Value Date  MICRALBCREAT 614.7 (H) 03/13/2012    Lab Results  Component Value Date   FRUCTOSAMINE 446 (H) 04/10/2020   FRUCTOSAMINE 598 (H) 12/08/2019   FRUCTOSAMINE 318 (H) 06/05/2017      Allergies as of 06/23/2020      Reactions   Penicillins Rash, Other (See Comments)   Tolerating Ceftriaxone (03/12/12) Has patient had a PCN reaction causing immediate rash, facial/tongue/throat swelling, SOB or lightheadedness with hypotension: Yes Has patient had a PCN reaction causing severe rash involving mucus membranes or skin necrosis: Unk Has patient had a PCN reaction that required hospitalization: No Has patient had a PCN reaction occurring within the last 10 years: No If all of the above answers are "NO", then may proceed with Cephalosporin use.      Medication List       Accurate as of June 23, 2020  8:53 AM. If you have any questions, ask your nurse or doctor.        aspirin 81 MG tablet Take 81 mg by mouth every morning.   atorvastatin 10 MG tablet Commonly known as: LIPITOR Take 1 tablet (10 mg total) by mouth daily.   b complex-vitamin c-folic acid 0.8 MG Tabs tablet Take 1 tablet by mouth daily.     carvedilol 25 MG tablet Commonly known as: COREG Take 1.5 tablets (37.5 mg total) by mouth 2 (two) times daily with a meal.   Entresto 24-26 MG Generic drug: sacubitril-valsartan Take 1 tablet by mouth 2 (two) times daily.   ethyl chloride spray Apply 1 application topically 3 (three) times a week. Apply 1 spray to skin three times weekly.   FreeStyle Libre 14 Day Reader Kerrin Mo 1 each by Does not apply route See admin instructions. Use to monitor blood sugars.   FreeStyle Libre 2 Sensor Misc USE AS DIRECTED   gabapentin 100 MG capsule Commonly known as: NEURONTIN Take 100 mg by mouth 3 (three) times daily as needed.   glucose blood test strip Use Onetouch verio test strips as instructed to check blood sugar twice daily.   HYDROcodone-acetaminophen 5-325 MG tablet Commonly known as: Norco Take 1 tablet by mouth every 6 (six) hours as needed for moderate pain.   ivabradine 5 MG Tabs tablet Commonly known as: CORLANOR Take 5 mg by mouth 2 (two) times daily with a meal.   lanthanum 1000 MG chewable tablet Commonly known as: FOSRENOL Chew 1 tablet (1,000 mg total) by mouth 3 (three) times daily with meals. What changed: how much to take   NovoLOG FlexPen 100 UNIT/ML FlexPen Generic drug: insulin aspart 5 to 7 units for breakfast and lunch and 10 to 14 units at dinner based on meal size and carbohydrateS   OneTouch Delica Lancets 10O Misc Use onetouch delica lancets to check blood sugar twice daily.   OneTouch Verio Flex System w/Device Kit Use to check blood sugar twice daily.   Patiromer Sorbitex Calcium 16.8 g Pack Take 16.8 g by mouth daily.   Tyler Aas FlexTouch 100 UNIT/ML FlexTouch Pen Generic drug: insulin degludec Inject 0.38 mLs (38 Units total) into the skin daily.   Tums Ultra 1000 400 MG chewable tablet Generic drug: calcium elemental as carbonate Chew 2,000 mg by mouth 3 (three) times daily.   Vitamin D (Ergocalciferol) 1.25 MG (50000 UNIT) Caps  capsule Commonly known as: DRISDOL Take 50,000 Units by mouth every 7 (seven) days.       Allergies:  Allergies  Allergen Reactions  . Penicillins Rash and Other (See Comments)  Tolerating Ceftriaxone (03/12/12) Has patient had a PCN reaction causing immediate rash, facial/tongue/throat swelling, SOB or lightheadedness with hypotension: Yes Has patient had a PCN reaction causing severe rash involving mucus membranes or skin necrosis: Unk Has patient had a PCN reaction that required hospitalization: No Has patient had a PCN reaction occurring within the last 10 years: No If all of the above answers are "NO", then may proceed with Cephalosporin use.     Past Medical History:  Diagnosis Date  . Bacteremia   . Diabetes mellitus   . Dialysis patient ([place])   . Hypertension   . Pneumonia 02/2012   Strep pneumoniae bilateral pneumonia complicated by bacteremia  . Renal disorder    End stage, not on dialysis    Past Surgical History:  Procedure Laterality Date  . AMPUTATION TOE    . AV FISTULA PLACEMENT Left 05/22/2018   Procedure: Creation of BrachiaCephalic Fistula Left Arm;  Surgeon: Angelia Mould, MD;  Location: [place];  Service: Vascular;  Laterality: Left;  . BASCILIC VEIN TRANSPOSITION Left 11/12/2018   Procedure: BASILIC VEIN TRANSPOSITION LEFT ARM;  Surgeon: Serafina Mitchell, MD;  Location: [place];  Service: Vascular;  Laterality: Left;  . CHEST TUBE INSERTION     placed during hospitalization 02/2012  . COLONOSCOPY  04/01/2012   Procedure: COLONOSCOPY;  Surgeon: Juanita Craver, MD;  Location[facility] ENDOSCOPY;  Service: Endoscopy;  Laterality: N/A;  . ESOPHAGOGASTRODUODENOSCOPY  03/31/2012   Procedure: ESOPHAGOGASTRODUODENOSCOPY (EGD);  Surgeon: Beryle Beams, MD;  Location: [facility] END[facility]COPY;  Service: Endoscopy;  Laterality: N/A;  . EXCHANGE OF A DIALYSIS CATHETER Right 05/22/2018   Procedure: EXCHANGE OF A DIALYSIS CATHETER;  Surgeon: Angelia Mould, MD;  Location: [place];  Service: Vascular;  Laterality: Right;  . IR FLUORO GUIDE CV LINE RIGHT  05/20/2018  . IR THROMBECTOMY AV FISTULA W/THROMBOLYSIS INC/SHUNT/IMG LEFT Left 09/10/2018  . IR US GUIDE VASC ACCESS LEFT  09/10/2018  . IR US GUIDE VASC ACCESS RIGHT  05/20/2018    Family History  Problem Relation Age of Onset  . Diabetes Maternal Grandmother     Social History:  reports that he has never smoked. He has never used smokeless tobacco. He reports current alcohol use. He reports that he does not use drugs.   Review of Systems  Lipid history: No treatment currently for hyperlipidemia, previously had taken atorvastatin No history of CAD    Lab Results  Component Value Date   CHOL 159 04/10/2020   HDL 40.90 04/10/2020   LDLCALC 97 04/10/2020   TRIG 104.0 04/10/2020   CHOLHDL 4 04/10/2020           Hypertension:Treated with carvedilol and amlodipine followed by nephrologist Has history of CHF, taking Entresto  BP Readings from Last 3 Encounters:  04/12/20 136/72  12/10/19 114/64  03/19/19 124/72     Most recent eye exam was reportedly in 2020 but no records available, has had an intervention done by a retina surgeon  Has had amputation of the left toe  Complications of diabetes: Neuropathy, Nephropathy, proliferative retinopathy  LABS:  No visits with results within 1 Week(s) from this visit.  Latest known visit with results is:  Office Visit on 04/12/2020  Component Date Value Ref Range Status  . POC Glucose 04/12/2020 215* 70 - 99 mg/dl Final    Physical Examination:  There were no vitals taken for this visit.  No ankle edema present      ASSESSMENT:  Diabetes type 2,  uncontrolled, insulin-dependent for several years  See history of present illness for detailed discussion of current diabetes management, blood sugar patterns and problems identified  A1c is 6.7, previously 8.6 However fructosamine is 446  He is coming back after about 5 months  He is back on his  insulin with basal bolus regimen However even though his A1c looks excellent he is getting falsely low readings with his freestyle libre Blood sugar is in the office about 50 mg lower than the actual reading Also although his overnight blood sugars appear to be the lowest he has not had any symptoms of hypoglycemia  Discussed A1c and fructosamine levels and his actual blood sugars as well as blood sugar targets  LIPIDS: Better controlled with starting atorvastatin 10 mg and he will continue the same dose  HYPERTENSION: It is controlled, followed by cardiologist and nephrologist  PLAN:    Restart checking fingersticks with a FreeStyle meter using his sensor reader  He will call the company to see why he has difficulties with accuracy  In the meantime he can reduce the alarms for low readings to be below 60 instead of 70  He will adjust his Tyler Aas once he has more reliable blood sugar readings and keep morning sugars between 80-120  Discussed that he needs to take NovoLog with every meal regardless of blood sugar reading since he is eating carbohydrates at most meals including breakfast  Most likely he will need to adjust his suppertime dose based on the readings 2 hours later  Likely needs to reduce carbohydrate in the morning if postprandial readings are continuing to be high  Consider using Dexcom or guardian sensor if freestyle libre still continues to be inaccurate and once he is able to afford it, discussed the differences  There are no Patient Instructions on file for this visit.    Elayne Snare 06/23/2020, 8:53 AM   Note: This office note was prepared with Dragon voice recognition system technology. Any transcriptional errors that result from this process are unintentional.

## 2020-08-04 ENCOUNTER — Encounter: Payer: Self-pay | Admitting: General Practice

## 2020-12-01 ENCOUNTER — Telehealth: Payer: Self-pay | Admitting: Endocrinology

## 2020-12-01 NOTE — Telephone Encounter (Signed)
Open in error

## 2021-01-14 NOTE — Progress Notes (Signed)
ADVANCED HF CLINIC CONSULT NOTE  Referring Physician: Dr. Carolin [name] Primary Care: [name] Bene, PA-C Primary Cardiologist: Dr. Donley [name] Nephrology: [name] [name] AHF: Dr. Alric [name] [facility])  HPI:   [name] [name] is a 55 y.o. male with chronic systolic HF (EF 78-58%) due to NICM , ESRD on HD TTS (since 2019), DM2, HTN and LBBB referred by Dr. Carolin [name] for further evaluation of his HF  First diagnosed w/ HF in 1/20 when he was undergoing w/u for renal/pancrease transplantation and TTE showed LVEF 20-25%, normal RV size and function, mild PH w/ RVSP 54, dilated LV w/ LVIDd of 6.1cm. and EKG w/ wide LBBB. Cath showed non-obstructive CAD  Started on GDMT. Repeat TTE 06/18/2019 on BB alone showed mild improvement in LVEF to 25-30%. Echo 06/2020 w/ LVEF 35-40%. Last seen by Dr. Alric [name] 9/1/21and explained that CRT was the standard of care and this was recommended. He was referred back to EP for evaluation for CRT as he previously no showed his appointment. He apparently got very upset with the [name] [name] transplant team and said he was not told about RV dysfunction and did not want to be "cut on" for CRT. Notes says he was focused on EF alone and getting approved for renal transplantation. He saw Dr. Rosita [name] in EP on 11/22/20 and apparently "reluctantly" agreed to CRT implant if EF not improving.   Says he is feeling quite well. Since being titrated on medical therapy BP has not bottomed out during HD. Goes to the gym routinely. Walks almost 2 miles around the track in about 30 mins. Doing some weights. No CP or SOB. Occasional skipped beats at night. Occasional palpitations during HD.    Review of Systems: [y] = yes, [ ]  = no   General: Weight gain [ ] ; Weight loss [ ] ; Anorexia [ ] ; Fatigue [ ] ; Fever [ ] ; Chills [ ] ; Weakness [ ]   Cardiac: Chest pain/pressure [ ] ; Resting SOB [ ] ; Exertional SOB [ ] ; Orthopnea [ ] ; Pedal Edema [name] [name] ]; Palpitations [name] [name] ]; Syncope [ ] ; Presyncope [ ] ; Paroxysmal  nocturnal dyspnea[ ]   Pulmonary: Cough [ ] ; Wheezing[ ] ; Hemoptysis[ ] ; Sputum [ ] ; Snoring [ ]   GI: Vomiting[ ] ; Dysphagia[ ] ; Melena[ ] ; Hematochezia [ ] ; Heartburn[ ] ; Abdominal pain [ ] ; Constipation [ ] ; Diarrhea [ ] ; BRBPR [ ]   GU: Hematuria[ ] ; Dysuria [ ] ; Nocturia[ ]   Vascular: Pain in legs with walking [ ] ; Pain in feet with lying flat [ ] ; Non-healing sores [ ] ; Stroke [ ] ; TIA [ ] ; Slurred speech [ ] ;  Neuro: Headaches[ ] ; Vertigo[ ] ; Seizures[ ] ; Paresthesias[ ] ;Blurred vision [ ] ; Diplopia [ ] ; Vision changes [ ]   Ortho/Skin: Arthritis [ ] ; Joint pain [ ] ; Muscle pain [ ] ; Joint swelling [ ] ; Back Pain [ ] ; Rash [ ]   Psych: Depression[ ] ; Anxiety[ ]   Heme: Bleeding problems [ ] ; Clotting disorders [ ] ; Anemia [ ]   Endocrine: Diabetes [name] [name] ]; Thyroid dysfunction[ ]    Past Medical History:  Diagnosis Date  . Bacteremia   . Diabetes mellitus   . Dialysis patient ([place])   . Hypertension   . Pneumonia 02/2012   Strep pneumoniae bilateral pneumonia complicated by bacteremia  . Renal disorder    End stage, not on dialysis    Current Outpatient Medications  Medication Sig Dispense Refill  . aspirin 81 MG tablet Take 81 mg by mouth every morning.     Marland Kitchen b complex-vitamin c-folic acid (NEPHRO-VITE) 0.8 MG TABS  tablet Take 1 tablet by mouth daily.    . Blood Glucose Monitoring Suppl (ONETOUCH VERIO FLEX SYSTEM) w/Device KIT Use to check blood sugar twice daily. 1 kit 0  . calcium elemental as carbonate (BARIATRIC TUMS ULTRA) 400 MG chewable tablet Chew 2,000 mg by mouth 3 (three) times daily.    . carvedilol (COREG) 25 MG tablet Take 1.5 tablets (37.5 mg total) by mouth 2 (two) times daily with a meal. 90 tablet 11  . Continuous Blood Gluc Receiver (FREESTYLE LIBRE 14 DAY READER) DEVI 1 each by Does not apply route See admin instructions. Use to monitor blood sugars. 1 each 0  . Continuous Blood Gluc Sensor (FREESTYLE LIBRE 2 SENSOR) MISC USE AS DIRECTED 2 each 0  . ethyl chloride  spray Apply 1 application topically 3 (three) times a week. Apply 1 spray to skin three times weekly.    Marland Kitchen gabapentin (NEURONTIN) 100 MG capsule Take 100 mg by mouth 3 (three) times daily as needed.    Marland Kitchen glucose blood test strip Use Onetouch verio test strips as instructed to check blood sugar twice daily. 100 each 2  . insulin aspart (NOVOLOG FLEXPEN) 100 UNIT/ML FlexPen 5 to 7 units for breakfast and lunch and 10 to 14 units at dinner based on meal size and carbohydrateS 45 mL 1  . insulin degludec (TRESIBA FLEXTOUCH) 100 UNIT/ML FlexTouch Pen Inject 0.38 mLs (38 Units total) into the skin daily. 45 mL 1  . ivabradine (CORLANOR) 5 MG TABS tablet Take 5 mg by mouth 2 (two) times daily with a meal.    . lanthanum (FOSRENOL) 1000 MG chewable tablet Chew 1 tablet (1,000 mg total) by mouth 3 (three) times daily with meals. (Patient taking differently: Chew 2,000 mg by mouth 3 (three) times daily with meals.) 90 tablet 0  . OneTouch Delica Lancets 44W MISC Use onetouch delica lancets to check blood sugar twice daily. 100 each 2  . sacubitril-valsartan (ENTRESTO) 49-51 MG Take 1 tablet by mouth 2 (two) times daily.    . Vitamin D, Ergocalciferol, (DRISDOL) 1.25 MG (50000 UNIT) CAPS capsule Take 50,000 Units by mouth every 7 (seven) days.     No current facility-administered medications for this encounter.    Allergies  Allergen Reactions  . Penicillins Rash and Other (See Comments)    Tolerating Ceftriaxone (03/12/12) Has patient had a PCN reaction causing immediate rash, facial/tongue/throat swelling, SOB or lightheadedness with hypotension: Yes Has patient had a PCN reaction causing severe rash involving mucus membranes or skin necrosis: Unk Has patient had a PCN reaction that required hospitalization: No Has patient had a PCN reaction occurring within the last 10 years: No If all of the above answers are "NO", then may proceed with Cephalosporin use.       Social History   Socioeconomic  History  . Marital status: Divorced    Spouse name: Not on file  . Number of children: Not on file  . Years of education: Not on file  . Highest education level: Not on file  Occupational History  . Occupation: Information systems manager: [employer]  Tobacco Use  . Smoking status: Never Smoker  . Smokeless tobacco: Never Used  Substance and Sexual Activity  . Alcohol use: Yes    Comment: rare   . Drug use: No  . Sexual activity: Not on file  Other Topics Concern  . Not on file  Social History Narrative   He is from [place] and lives at  home here with his daughter. He is divorced. 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    Social Determinants of Health   Financial Resource Strain: Not on file  Food Insecurity: Not on file  Transportation Needs: Not on file  Physical Activity: Not on file  Stress: Not on file  Social Connections: Not on file  Intimate Partner Violence: Not on file      Family History  Problem Relation Age of Onset  . Diabetes Maternal Grandmother     Vitals:   01/15/21 1159  BP: (!) 152/78  Pulse: 64  SpO2: 97%  Weight: 91.1 kg (200 lb 12.8 oz)    PHYSICAL EXAM: General:  Well appearing. No respiratory difficulty HEENT: normal Neck: supple. JVP 7-8 . Carotids 2+ bilat; no bruits. No lymphadenopathy or thryomegaly appreciated. Cor: PMI nondisplaced. Regular rate & rhythm. No rubs, gallops or murmurs. Lungs: clear Abdomen: soft, nontender, nondistended. No hepatosplenomegaly. No bruits or masses. Good bowel sounds. Extremities: no cyanosis, clubbing, rash, edema  Large AVF LUE Neuro: alert & oriented x 3, cranial nerves grossly intact. moves all 4 extremities w/o difficulty. Affect pleasant.  ECG: NSR 61 1AVB (271m) LBBB 162 ms Personally reviewed   ASSESSMENT & PLAN:  1. Chronic systolic heart failure due to NICM - Onset 1/20. EF 20-25%  - Cath 1/20  w/ nonobstructive CAD.  - Felt to be due to  LBBB - Echo 06/2020 w/ LVEF 35-40% and evidence of mild RV dysfunction which per renal transplant is prohibitive for consideration of transplant.  - Much improved with medical therapy NYHA I-II - Volume status slightly elevated (managed by HD) - Continue Entresto 49/51 bid - Continue carvedilol 37.5 bid - Continue ivabradine 5 bid - He is doing quite well with medical therapy. Suspect he has component of LBBB cardiomyopathy but EF has responded well to medical rx. Apparently needs EF 40% to qualify for renal transplant. - Given need for HD would obviously like to avoid implanted device if possible.  - He has not had an assessment of his EF since 7/21. Given improvement in symptoms, would recommend cMRI to reassess EF and look for other causes of cardiomyopathy. Given NYHA I-II, if EF >= 40% suspect we can forego CRT for now. If EF < 40% then would agree with CRT to facilitate further improvement in EF to qualify for renal transplantation.   2. LBBB - paln as above  3. ESRD on HD [facility] 4. HTN - control improved. Meds as above.   5. DM2 - Plan per endocrine.  - Not SGLT2i candidate in setting of HD  6. Palpitations - can place Zio if persist  DGlori Bickers MD  4:37 PM

## 2021-01-15 ENCOUNTER — Encounter (HOSPITAL_COMMUNITY): Payer: Self-pay | Admitting: Internal Medicine

## 2021-01-15 ENCOUNTER — Other Ambulatory Visit: Payer: Self-pay

## 2021-01-15 ENCOUNTER — Ambulatory Visit (HOSPITAL_COMMUNITY)
Admission: RE | Admit: 2021-01-15 | Discharge: 2021-01-15 | Disposition: A | Discharge status: Home / Self Care | Payer: 59 | Source: Ambulatory Visit | Attending: Internal Medicine | Admitting: Internal Medicine

## 2021-01-15 VITALS — BP 152/78 | HR 64 | Wt 200.8 lb

## 2021-01-15 DIAGNOSIS — I447 Left bundle-branch block, unspecified: Secondary | ICD-10-CM | POA: Diagnosis not present

## 2021-01-15 DIAGNOSIS — Z992 Dependence on renal dialysis: Secondary | ICD-10-CM

## 2021-01-15 DIAGNOSIS — I5022 Chronic systolic (congestive) heart failure: Secondary | ICD-10-CM | POA: Diagnosis not present

## 2021-01-15 DIAGNOSIS — I429 Cardiomyopathy, unspecified: Secondary | ICD-10-CM

## 2021-01-15 DIAGNOSIS — R002 Palpitations: Secondary | ICD-10-CM

## 2021-01-15 DIAGNOSIS — N186 End stage renal disease: Secondary | ICD-10-CM

## 2021-01-15 LAB — BASIC METABOLIC PANEL
Anion gap: 16 — ABNORMAL HIGH (ref 5–15)
BUN: 67 mg/dL — ABNORMAL HIGH (ref 6–20)
CO2: 28 mmol/L (ref 22–32)
Calcium: 8.2 mg/dL — ABNORMAL LOW (ref 8.9–10.3)
Chloride: 96 mmol/L — ABNORMAL LOW (ref 98–111)
Creatinine, Ser: 8.99 mg/dL — ABNORMAL HIGH (ref 0.61–1.24)
GFR, Estimated: 6 mL/min — ABNORMAL LOW (ref >60)
Glucose, Bld: 71 mg/dL (ref 70–99)
Potassium: 4.6 mmol/L (ref 3.5–5.1)
Sodium: 140 mmol/L (ref 135–145)

## 2021-01-15 NOTE — Patient Instructions (Signed)
You have been ordered for an MRI of your heart. You will get a call to schedule this appointment.   Labs today We will only contact you if something comes back abnormal or we need to make some changes. Otherwise no news is good news!  Your physician recommends that you schedule a follow-up appointment in: 3 months with Dr Haroldine Laws  Please call office at [phone] option [phone] if you have any questions or concerns.   At the [facility], you and your health needs are our priority. As part of our continuing mission to provide you with exceptional heart care, we have created designated Provider Care Teams. These Care Teams include your primary Cardiologist (physician) and Advanced Practice Providers (APPs- Physician Assistants and Nurse Practitioners) who all work together to provide you with the care you need, when you need it.   You may see any of the following providers on your designated Care Team at your next follow up: Marland Kitchen Dr Glori Bickers . Dr Loralie Champagne . Darrick Grinder, NP . Lyda Jester, PA . Audry Riles, PharmD   Please be sure to bring in all your medications bottles to every appointment.

## 2021-01-17 ENCOUNTER — Telehealth (HOSPITAL_COMMUNITY): Payer: Self-pay | Admitting: Vascular Surgery

## 2021-01-17 NOTE — Telephone Encounter (Signed)
Encounter open error 

## 2021-02-13 ENCOUNTER — Other Ambulatory Visit: Payer: Self-pay | Admitting: Nephrology

## 2021-02-20 ENCOUNTER — Telehealth (HOSPITAL_COMMUNITY): Payer: Self-pay | Admitting: Emergency Medicine

## 2021-02-20 ENCOUNTER — Telehealth (HOSPITAL_COMMUNITY): Payer: Self-pay | Admitting: *Deleted

## 2021-02-20 NOTE — Telephone Encounter (Signed)
Attempted to call patient regarding upcoming cardiac MRI appointment. Left message on voicemail with name and callback number  Morning Halberg RN Navigator Cardiac Imaging [facility] [phone] Office [phone] Cell  

## 2021-02-21 ENCOUNTER — Ambulatory Visit (HOSPITAL_COMMUNITY)
Admission: RE | Admit: 2021-02-21 | Discharge: 2021-02-21 | Disposition: A | Discharge status: Home / Self Care | Payer: 59 | Source: Ambulatory Visit | Attending: Internal Medicine | Admitting: Internal Medicine

## 2021-02-21 ENCOUNTER — Other Ambulatory Visit: Payer: Self-pay

## 2021-02-21 DIAGNOSIS — I429 Cardiomyopathy, unspecified: Secondary | ICD-10-CM | POA: Insufficient documentation

## 2021-02-21 MED ORDER — GADOBUTROL 1 MMOL/ML IV SOLN
11.0000 mL | Freq: Once | INTRAVENOUS | Status: AC | PRN
  Administered 2021-02-21: 11 mL via INTRAVENOUS

## 2021-02-26 ENCOUNTER — Telehealth (HOSPITAL_COMMUNITY): Payer: Self-pay | Admitting: *Deleted

## 2021-02-26 DIAGNOSIS — I447 Left bundle-branch block, unspecified: Secondary | ICD-10-CM

## 2021-02-26 DIAGNOSIS — I429 Cardiomyopathy, unspecified: Secondary | ICD-10-CM

## 2021-02-26 NOTE — Telephone Encounter (Signed)
Bensimhon, Shaune Pascal, MD Scarlette Calico, RN; Deboraha Sprang, MD Please let him know.   Lets also refer to Dr. Caryl Comes to discuss CRT    Pt aware and agreeable, referral placed

## 2021-03-06 DIAGNOSIS — I5042 Chronic combined systolic (congestive) and diastolic (congestive) heart failure: Secondary | ICD-10-CM | POA: Insufficient documentation

## 2021-03-06 DIAGNOSIS — I5022 Chronic systolic (congestive) heart failure: Secondary | ICD-10-CM | POA: Insufficient documentation

## 2021-03-19 ENCOUNTER — Encounter: Payer: Self-pay | Admitting: Internal Medicine

## 2021-03-19 ENCOUNTER — Other Ambulatory Visit: Payer: Self-pay

## 2021-03-19 ENCOUNTER — Ambulatory Visit (INDEPENDENT_AMBULATORY_CARE_PROVIDER_SITE_OTHER): Payer: 59 | Admitting: Internal Medicine

## 2021-03-19 VITALS — BP 150/66 | HR 64 | Ht 74.0 in | Wt 201.0 lb

## 2021-03-19 DIAGNOSIS — R002 Palpitations: Secondary | ICD-10-CM | POA: Diagnosis not present

## 2021-03-19 DIAGNOSIS — I5022 Chronic systolic (congestive) heart failure: Secondary | ICD-10-CM

## 2021-03-19 NOTE — Patient Instructions (Addendum)
Medication Instructions:  Your physician recommends that you continue on your current medications as directed. Please refer to the Current Medication list given to you today.  *If you need a refill on your cardiac medications before your next appointment, please call your pharmacy*   Lab Work: None ordered.  If you have labs (blood work) drawn today and your tests are completely normal, you will receive your results only by: Marland Kitchen MyChart Message (if you have MyChart) OR . A paper copy in the mail If you have any lab test that is abnormal or we need to change your treatment, we will call you to review the results.   Testing/Procedures: None ordered.    Follow-Up: At [facility], you and your health needs are our priority.  As part of our continuing mission to provide you with exceptional heart care, we have created designated Provider Care Teams.  These Care Teams include your primary Cardiologist (physician) and Advanced Practice Providers (APPs -  Physician Assistants and Nurse Practitioners) who all work together to provide you with the care you need, when you need it.  We recommend signing up for the patient portal called "MyChart".  Sign up information is provided on this After Visit Summary.  MyChart is used to connect with patients for Virtual Visits (Telemedicine).  Patients are able to view lab/test results, encounter notes, upcoming appointments, etc.  Non-urgent messages can be sent to your provider as well.   To learn more about what you can do with MyChart, go to [email or web address].    Your next appointment:  Tuesday 05/272022 at [place]

## 2021-03-19 NOTE — Progress Notes (Signed)
ELECTROPHYSIOLOGY CONSULT NOTE  Patient ID: [name] [name], MRN: [id number], DOB/AGE: Dec 23, 1966 55 y.o. Admit date: (Not on file) Date of Consult: 03/19/2021  Primary Physician: [name], PA-C Primary Cardiologist: *DB     [name] [name] is a 55 y.o. male who is being seen today for the evaluation of CRT at the request of DB.    HPI [name] [name] is a 55 y.o. male referred for consideration of CRT in the context of end-stage renal disease on hemodialysis currently being worked [facility]  Initially diagnosed 1/20 and found at that time to have a left bundle branch block; started on guideline directed therapy and repeat evaluation 6/20 demonstrated basically unchanged LV function and remained unchanged through 7/21. cMRI as noted below demonstrated an ejection fraction of 34%; apparently there is a recent [facility] with an ejection fraction of 45% and a Myoview scan pending  Referred to Dr. Norville Haggard who recommended CRT if his LV function remained depressed.  He quoted about a 70-75% chance of success in a 4-5% chance of infection.   At that point the patient wanted to hold off and see if his ejection fraction improved with further efforts  Functional status is quite good.  He has 20 stairs at home and is short of breath at the top of the stairs.  No edema.  No nocturnal dyspnea orthopnea.  No syncope or palpitations.  ,  DATE TEST EF   1/20 Echo   20-25 %   1/20 LHC  D-50%  7/21 Echo   35-40 %   3/22 cMRI 34% LGE neg  3/22 Echo (CE)  45%    Date Cr K Hgb  2/22 11.36 4.8 11.6            Past Medical History:  Diagnosis Date  . Bacteremia   . Diabetes mellitus   . Dialysis patient ([place])   . Hypertension   . Pneumonia 02/2012   Strep pneumoniae bilateral pneumonia complicated by bacteremia  . Renal disorder    End stage, not on dialysis      Surgical History:  Past Surgical History:   Procedure Laterality Date  . AMPUTATION TOE    . AV FISTULA PLACEMENT Left 05/22/2018   Procedure: Creation of BrachiaCephalic Fistula Left Arm;  Surgeon: Angelia Mould, MD;  Location: [place];  Service: Vascular;  Laterality: Left;  . BASCILIC VEIN TRANSPOSITION Left 11/12/2018   Procedure: BASILIC VEIN TRANSPOSITION LEFT ARM;  Surgeon: Serafina Mitchell, MD;  Location: [place];  Service: Vascular;  Laterality: Left;  . CHEST TUBE INSERTION     placed during hospitalization 02/2012  . COLONOSCOPY  04/01/2012   Procedure: COLONOSCOPY;  Surgeon: Juanita Craver, MD;  Location: [facility] ENDOSCOPY;  Service: Endoscopy;  Laterality: N/A;  . ESOPHAGOGASTRODUODENOSCOPY  03/31/2012   Procedure: ESOPHAGOGASTRODUODENOSCOPY (EGD);  Surgeon: Beryle Beams, MD;  Location[facility] ENDOSCOPY;  Service: Endoscopy;  Laterality: N/A;  . EXCHANGE OF A DIALYSIS CATHETER Right 05/22/2018   Procedure: EXCHANGE OF A DIALYSIS CATHETER;  Surgeon: Angelia Mould, MD;  Location: [place];  Service: Vascular;  Laterality: Right;  . IR FLUORO GUIDE CV LINE RIGHT  05/20/2018  . IR THROMBECTOMY AV FISTULA W/THROMBOLYSIS INC/SHUNT/IMG LEFT Left 09/10/2018  . IR US GUIDE VASC ACCESS LEFT  09/10/2018  . IR US GUIDE VASC ACCESS RIGHT  05/20/2018     Home Meds: Current Meds  Medication Sig  . aspirin 81 MG tablet Take 81 mg by mouth every morning.   Marland Kitchen b complex-vitamin c-folic acid (NEPHRO-VITE) 0.8 MG TABS tablet Take 1 tablet by mouth daily.  . Blood Glucose Monitoring Suppl (ONETOUCH VERIO FLEX SYSTEM) w/Device KIT Use to check blood sugar twice daily.  . calcium elemental as carbonate (BARIATRIC TUMS ULTRA) 400 MG chewable tablet Chew 2,000 mg by mouth 3 (three) times daily.  . carvedilol (COREG) 25 MG tablet Take 1.5 tablets (37.5 mg total) by mouth 2 (two) times daily with a meal.  . Continuous Blood Gluc Receiver (FREESTYLE LIBRE 14 DAY READER) DEVI 1 each by Does not apply route See admin instructions. Use to monitor blood  sugars.  . Continuous Blood Gluc Sensor (FREESTYLE LIBRE 2 SENSOR) MISC USE AS DIRECTED  . ethyl chloride spray Apply 1 application topically 3 (three) times a week. Apply 1 spray to skin three times weekly.  Marland Kitchen gabapentin (NEURONTIN) 100 MG capsule Take 100 mg by mouth 3 (three) times daily as needed.  Marland Kitchen glucose blood test strip Use Onetouch verio test strips as instructed to check blood sugar twice daily.  . insulin aspart (NOVOLOG FLEXPEN) 100 UNIT/ML FlexPen 5 to 7 units for breakfast and lunch and 10 to 14 units at dinner based on meal size and carbohydrateS  . insulin degludec (TRESIBA FLEXTOUCH) 100 UNIT/ML FlexTouch Pen Inject 0.38 mLs (38 Units total) into the skin daily.  . ivabradine (CORLANOR) 5 MG TABS tablet Take 5 mg by mouth 2 (two) times daily with a meal.  . lanthanum (FOSRENOL) 1000 MG chewable tablet Chew 1 tablet (1,000 mg total) by mouth 3 (three) times daily with meals. (Patient taking differently: Chew 2,000 mg by mouth 3 (three) times daily with meals.)  . OneTouch Delica Lancets 09B MISC Use onetouch delica lancets to check blood sugar twice daily.  . sacubitril-valsartan (ENTRESTO) 49-51 MG Take 1 tablet by mouth 2 (two) times daily.  . Vitamin D, Ergocalciferol, (DRISDOL) 1.25 MG (50000 UNIT) CAPS capsule Take 50,000 Units by mouth every 7 (seven) days.    Allergies:  Allergies  Allergen Reactions  . Penicillins Rash and Other (See Comments)    Tolerating Ceftriaxone (03/12/12) Has patient had a PCN reaction causing immediate rash, facial/tongue/throat swelling, SOB or lightheadedness with hypotension: Yes Has patient had a PCN reaction causing severe rash involving mucus membranes or skin necrosis: Unk Has patient had a PCN reaction that required hospitalization: No Has patient had a PCN reaction occurring within the last 10 years: No If all of the above answers are "NO", then may proceed with Cephalosporin use.     Social History   Socioeconomic History  .  Marital status: Divorced    Spouse name: Not on file  . Number of children: Not on file  . Years of education: Not on file  . Highest education level: Not on file  Occupational History  . Occupation: Information systems manager: [employer]  Tobacco Use  . Smoking status: Never Smoker  . Smokeless tobacco: Never Used  Substance and Sexual Activity  . Alcohol use: Yes    Comment: rare   . Drug use: No  . Sexual activity: Not on file  Other Topics Concern  . Not on file  Social History Narrative   He is from [place] and lives at home here with his daughter. He is divorced. 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  Social Determinants of Health   Financial Resource Strain: Not on file  Food Insecurity: Not on file  Transportation Needs: Not on file  Physical Activity: Not on file  Stress: Not on file  Social Connections: Not on file  Intimate Partner Violence: Not on file     Family History  Problem Relation Age of Onset  . Diabetes Maternal Grandmother      ROS:  Please see the history of present illness.     All other systems reviewed and negative.    Physical Exam:*  Blood pressure (!) 150/66, pulse 64, height 6' 2" (1.88 m), weight 201 lb (91.2 kg), SpO2 98 %. General: Well developed, well nourished male in no acute distress. Head: Normocephalic, atraumatic, sclera non-icteric, no xanthomas, nares are without discharge. EENT: normal  Lymph Nodes:  none Neck: Negative for carotid bruits. JVD 7-8 Back:without scoliosis kyphosis*  Lungs: Clear bilaterally to auscultation without wheezes, rales, or rhonchi. Breathing is unlabored. Heart: RRR with S1 S2. No murmur . No rubs, or gallops appreciated. Abdomen: Soft, non-tender, non-distended with normoactive bowel sounds. No hepatomegaly. No rebound/guarding. No obvious abdominal masses. Msk:  Strength and tone appear normal for age. Extremities: No clubbing or cyanosis. No   edema.  Distal pedal pulses are 2+ and equal bilaterally. Skin: Warm and Dry Neuro: Alert and oriented X 3. CN III-XII intact Grossly normal sensory and motor function . Psych:  Responds to questions appropriately with a normal affect.      Labs: Cardiac Enzymes No results for input(s): CKTOTAL, CKMB, TROPONINI in the last 72 hours. CBC Lab Results  Component Value Date   WBC 10.1 02/06/2019   HGB 11.7 (L) 02/06/2019   HCT 38.1 (L) 02/06/2019   MCV 95.3 02/06/2019   PLT 130 (L) 02/06/2019   PROTIME: No results for input(s): LABPROT, INR in the last 72 hours. Chemistry No results for input(s): NA, K, CL, CO2, BUN, CREATININE, CALCIUM, PROT, BILITOT, ALKPHOS, ALT, AST, GLUCOSE in the last 168 hours.  Invalid input(s): LABALBU Lipids Lab Results  Component Value Date   CHOL 159 04/10/2020   HDL 40.90 04/10/2020   LDLCALC 97 04/10/2020   TRIG 104.0 04/10/2020   BNP Pro B Natriuretic peptide (BNP)  Date/Time Value Ref Range Status  03/28/2012 12:08 AM 178.6 (H) 0 - 125 pg/mL Final   Thyroid Function Tests: No results for input(s): TSH, T4TOTAL, T3FREE, THYROIDAB in the last 72 hours.  Invalid input(s): FREET3 Miscellaneous No results found for: DDIMER  Radiology/Studies:  MR Card Morphology Wo/W Cm  Result Date: 02/22/2021 CLINICAL DATA:  Nonischemic cardiomyopathy EXAM: CARDIAC MRI TECHNIQUE: The patient was scanned on a 1.5 Tesla GE magnet. A dedicated cardiac coil was used. Functional imaging was done using Fiesta sequences. 2,3, and 4 chamber views were done to assess for RWMA's. Modified Simpson's rule using a short axis stack was used to calculate an ejection fraction on a dedicated work Conservation officer, nature. The patient received 8 cc of Gadavist. After 10 minutes inversion recovery sequences were used to assess for infiltration and scar tissue. FINDINGS: Limited images of the lung fields showed no gross abnormalities. Moderately dilated left ventricle with  mild LV hypertrophy. Diffuse hypokinesis with septal-lateral dyssynchrony consistent with LBBB, EF 34%. Normal right ventricular size and systolic function, 20%. Normal left and right atrial sizes. Mild mitral regurgitation. Trileaflet aortic valve with no stenosis or regurgitation. Difficult delayed enhancement images. I do not see any definite late gadolinium enhancement (LGE). Measurements: LVEDV 302  mL LVSV 103 mL LVEF 34% RVEDV 189 mL RVSV 89 mL RVEF 47% ECV 35% IMPRESSION: 1. Moderately dilated LV with mild LVH. Diffuse hypokinesis with septal-lateral dyssynchrony consistent with LBBB, EF 34%. 2.  Normal RV size and systolic function, EF 86%. 3. Mildly increased ECV percentage at 35%, not suggestive of amyloidosis. 4.  No definite myocardial LGE though images were difficult. Possible LBBB cardiomyopathy. Dalton Mclean Electronically Signed   By: Loralie Champagne M.D.   On: 02/22/2021 14:22    EKG: 1/22 sinus @ 61 22/16/50 LBBB w fractionation   Assessment and Plan:  Nonischemic cardiomyopathy ejection fraction somewhere between 35 and 45% based on recent measurements  Congestive heart failure-chronic-systolic-class II  Left bundle branch block  Evaluation pending for transplant of kidney plus minus pancreas  Hypertension  End-stage renal disease on dialysis   The patient has modest heart failure symptoms on guideline directed therapy.  He is being considered for transplant and has a barrier to further evaluation based on his ejection fraction.  [facility] [place] [facility] is [facility]% and at [facility] for kidney plus pancreas it is 45%.  cMRI demonstrated EF of 34% and the most recent echo [facility] apparently was about 45% with Myoview result pending.  Given the paucity of the symptoms of the borderline ejection fraction, I have suggested that we wait until he has a discussion with [facility] regarding their [facility] of [facility].  If his ejection fraction is  acceptable at its current level, and given the paucity of his symptoms, and given the increased risk of infection associated with the device because of renal replacement therapy, I would be inclined to defer device implantation.  However, if his symptoms worsen, his ejection fraction is not as most recently reported, or if his ejection fraction remains a barrier to consideration for transplant given the lack of gadolinium enhancement and his nonischemic myopathy I suspect his response to CRT would be 70-75% as estimated by Dr. Rosita Fire.  We had an extensive discussion regarding his nonischemic cardiomyopathy, left bundle branch block and resynchronization and aforementioned issues related to his transplant evaluation      Virl Axe

## 2021-04-09 ENCOUNTER — Encounter (HOSPITAL_COMMUNITY): Payer: 59 | Admitting: Internal Medicine

## 2021-05-02 ENCOUNTER — Emergency Department
Admission: EM | Admit: 2021-05-02 | Discharge: 2021-05-02 | Disposition: A | Discharge status: Home / Self Care | Payer: 59 | Source: Home / Self Care | Attending: Family Medicine | Admitting: Family Medicine

## 2021-05-02 DIAGNOSIS — H5712 Ocular pain, left eye: Secondary | ICD-10-CM | POA: Diagnosis not present

## 2021-05-02 NOTE — ED Triage Notes (Signed)
Pt c/o LT eye pain since this am. Michela Pitcher he woke up feeling like there was something in it. Rubbed it and started watering. 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 Says some discomfort in upper part of lid. Pain 3/10

## 2021-05-02 NOTE — Discharge Instructions (Addendum)
May continue rinses and eye drops fir comfort Use the eye ointment 3  x day until improved See eye doctor if worsens

## 2021-05-02 NOTE — ED Provider Notes (Signed)
[name] [name] CARE    CSN: [id number] Arrival date & time: 05/02/21  1138      History   Chief Complaint Chief Complaint  Patient presents with  . Eye Pain    LT    HPI [name] [name] is a 55 y.o. male.   HPI   Patient is here for eye pain.  He woke up with a sensation that there was something in his left eye this morning.  It feels gritty.  He has had a lot of tearing.  No change in visual acuity.  No runny nose or cold symptoms.  No fever or chills.  He does not recall injury yesterday.  He has tried to rinse his eye out. Patient is a diabetic.  Kidney failure.  On dialysis.  Hypertension.  States he is compliant with his medical care.  Past Medical History:  Diagnosis Date  . Bacteremia   . Diabetes mellitus   . Dialysis patient ([place])   . Hypertension   . Pneumonia 02/2012   Strep pneumoniae bilateral pneumonia complicated by bacteremia  . Renal disorder    End stage, not on dialysis    Patient Active Problem List   Diagnosis Date Noted  . Chronic systolic heart failure ([place]) 03/06/2021  . Elbow pain 07/03/2018  . Nausea & vomiting 05/20/2018  . Acute respiratory failure with hypoxia ([place]) 05/19/2018  . Type II diabetes mellitus with renal manifestations ([place]) 05/19/2018  . Anemia 05/19/2018  . Hypocalcemia 05/19/2018  . HCAP (healthcare-associated pneumonia) 05/19/2018  . ESRD (end stage renal disease) ([place])   . CKD (chronic kidney disease), stage V ([place]) 03/02/2018  . Acute on chronic diastolic CHF (congestive heart failure) ([place]) 03/02/2018  . CAP (community acquired pneumonia) 03/02/2018  . AKI (acute kidney injury) ([place]) 01/29/2017  . Palpitations 01/13/2017  . Essential hypertension, benign 10/26/2013  . Dyspnea 06/09/2012  . Dizziness 06/09/2012  . Pneumonia, organism unspecified([place]) 04/07/2012  . Pleural effusion 04/07/2012  . Parapneumonic effusion 03/28/2012  . Symptomatic anemia 03/28/2012  . Hypoalbuminemia 03/28/2012  . Total  bilirubin, elevated 03/28/2012  . Bilateral leg edema 03/23/2012  . Cyst of skin 03/23/2012  . Bacteremia due to Streptococcus pneumoniae 03/15/2012  . Hyponatremia 03/15/2012  . Acute kidney failure 03/13/2012  . Elevated LFTs 03/13/2012  . Pneumococcal pneumonia ([place]) 03/12/2012  . Insulin-requiring or dependent type II diabetes mellitus ([place]) 03/12/2012    Past Surgical History:  Procedure Laterality Date  . AMPUTATION TOE    . AV FISTULA PLACEMENT Left 05/22/2018   Procedure: Creation of BrachiaCephalic Fistula Left Arm;  Surgeon: Angelia Mould, MD;  Location: [place];  Service: Vascular;  Laterality: Left;  . BASCILIC VEIN TRANSPOSITION Left 11/12/2018   Procedure: BASILIC VEIN TRANSPOSITION LEFT ARM;  Surgeon: Serafina Mitchell, MD;  Location: [place];  Service: Vascular;  Laterality: Left;  . CHEST TUBE INSERTION     placed during hospitalization 02/2012  . COLONOSCOPY  04/01/2012   Procedure: COLONOSCOPY;  Surgeon: Juanita Craver, MD;  Location[facility];  Service: Endoscopy;  Laterality: N/A;  . ESOPHAGOGASTRODUODENOSCOPY  03/31/2012   Procedure: ESOPHAGOGASTRODUODENOSCOPY (EGD);  Surgeon: Beryle Beams, MD;  Location: [facility] END[facility];  Service: Endoscopy;  Laterality: N/A;  . EXCHANGE OF A DIALYSIS CATHETER Right 05/22/2018   Procedure: EXCHANGE OF A DIALYSIS CATHETER;  Surgeon: Angelia Mould, MD;  Location: [place];  Service: Vascular;  Laterality: Right;  . IR FLUORO GUIDE CV LINE RIGHT  05/20/2018  . IR THROMBECTOMY AV  FISTULA W/THROMBOLYSIS INC/SHUNT/IMG LEFT Left 09/10/2018  . IR US GUIDE VASC ACCESS LEFT  09/10/2018  . IR US GUIDE VASC ACCESS RIGHT  05/20/2018       Home Medications    Prior to Admission medications   Medication Sig Start Date End Date Taking? Authorizing Provider  aspirin 81 MG tablet Take 81 mg by mouth every morning.     [provider]  b complex-vitamin c-folic acid (NEPHRO-VITE) 0.8 MG TABS tablet Take 1 tablet by mouth daily.     [provider]  Blood Glucose Monitoring Suppl ([place]) w/Device KIT Use to check blood sugar twice daily. 04/21/20   Elayne Snare, MD  calcium elemental as carbonate (BARIATRIC TUMS ULTRA) 400 MG chewable tablet Chew 2,000 mg by mouth 3 (three) times daily.    [provider]  carvedilol (COREG) 25 MG tablet Take 1.5 tablets (37.5 mg total) by mouth 2 (two) times daily with a meal. 03/25/17   Troy Sine, MD  Continuous Blood Gluc Receiver (FREESTYLE LIBRE 14 DAY READER) DEVI 1 each by Does not apply route See admin instructions. Use to monitor blood sugars. 03/13/20   Elayne Snare, MD  Continuous Blood Gluc Sensor (FREESTYLE LIBRE 2 SENSOR) MISC USE AS DIRECTED 01/30/20   Elayne Snare, MD  ethyl chloride spray Apply 1 application topically 3 (three) times a week. Apply 1 spray to skin three times weekly.    [provider]  gabapentin (NEURONTIN) 100 MG capsule Take 100 mg by mouth 3 (three) times daily as needed.    [provider]  glucose blood test strip Use Onetouch verio test strips as instructed to check blood sugar twice daily. 04/21/20   Elayne Snare, MD  insulin aspart (NOVOLOG FLEXPEN) 100 UNIT/ML FlexPen 5 to 7 units for breakfast and lunch and 10 to 14 units at dinner based on meal size and carbohydrateS 12/10/19   Elayne Snare, MD  insulin degludec (TRESIBA FLEXTOUCH) 100 UNIT/ML FlexTouch Pen Inject 0.38 mLs (38 Units total) into the skin daily. 04/12/20   Elayne Snare, MD  ivabradine (CORLANOR) 5 MG TABS tablet Take 5 mg by mouth 2 (two) times daily with a meal.    [provider]  lanthanum (FOSRENOL) 1000 MG chewable tablet Chew 1 tablet (1,000 mg total) by mouth 3 (three) times daily with meals. Patient taking differently: Chew 2,000 mg by mouth 3 (three) times daily with meals. 05/25/18   Domenic Polite, MD  OneTouch Delica Lancets 59Y MISC Use onetouch delica lancets to check blood sugar twice daily. 04/21/20   Elayne Snare, MD   sacubitril-valsartan (ENTRESTO) 49-51 MG Take 1 tablet by mouth 2 (two) times daily.    [provider]  Vitamin D, Ergocalciferol, (DRISDOL) 1.25 MG (50000 UNIT) CAPS capsule Take 50,000 Units by mouth every 7 (seven) days.    [provider]    Family History Family History  Problem Relation Age of Onset  . Diabetes Maternal Grandmother     Social History Social History   Tobacco Use  . Smoking status: Never Smoker  . Smokeless tobacco: Never Used  Substance Use Topics  . Alcohol use: Yes    Comment: rare   . Drug use: No     Allergies   Penicillins   Review of Systems Review of Systems See HPI  Physical Exam Triage Vital Signs ED Triage Vitals  Enc Vitals Group     BP 05/02/21 1156 (!) 174/81     Pulse  Rate 05/02/21 1156 68     Resp 05/02/21 1156 17     Temp 05/02/21 1156 98.1 F (36.7 C)     Temp Source 05/02/21 1156 Oral     SpO2 05/02/21 1156 98 %     Weight --      Height --      Head Circumference --      Peak Flow --      Pain Score 05/02/21 1159 3     Pain Loc --      Pain Edu? --      Excl. in [place]? --    No data found.  Updated Vital Signs BP (!) 174/81 (BP Location: Right Arm)   Pulse 68   Temp 98.1 F (36.7 C) (Oral)   Resp 17   SpO2 98%   Visual Acuity Right Eye Distance: 20/20 Left Eye Distance: 20/30 Bilateral Distance: 20/25  Physical Exam Constitutional:      General: He is not in acute distress.    Appearance: He is well-developed and normal weight.  HENT:     Head: Normocephalic and atraumatic.     Nose:     Comments: Mask is in place Eyes:     General: Lids are normal. Lids are everted, no foreign bodies appreciated.        Right eye: No foreign body.        Left eye: No foreign body.     Conjunctiva/sclera: Conjunctivae normal.     Pupils: Pupils are equal, round, and reactive to light.     Comments: Both eyes are examined.  Focus on left eye.  No foreign body is seen.  Lids reverted.  Eyes  irrigated.  Fluorescein exam reveals no uptake.  Eye is irrigated a second time.  Erythromycin ointment placed for comfort  Cardiovascular:     Rate and Rhythm: Normal rate.  Pulmonary:     Effort: Pulmonary effort is normal. No respiratory distress.  Abdominal:     General: There is no distension.     Palpations: Abdomen is soft.  Musculoskeletal:        General: Normal range of motion.     Cervical back: Normal range of motion.  Skin:    General: Skin is warm and dry.  Neurological:     Mental Status: He is alert.      UC Treatments / Results  Labs (all labs ordered are listed, but only abnormal results are displayed) Labs Reviewed - No data to display  EKG   Radiology No results found.  Procedures Procedures (including critical care time)  Medications Ordered in UC Medications - No data to display  Initial Impression / Assessment and Plan / UC Course  I have reviewed the triage vital signs and the nursing notes.  Pertinent labs & imaging results that were available during my care of the patient were reviewed by me and considered in my medical decision making (see chart for details).     Patient may have had a small foreign body in his eye that irritated the eye.  Is also possible he is coming down with a viral conjunctivitis.  Both of these were discussed.  Both are self-limited and not dangerous.  He will return as needed Final Clinical Impressions(s) / UC Diagnoses   Final diagnoses:  Left eye pain     Discharge Instructions     May continue rinses and eye drops fir comfort Use the eye ointment 3  x day until  improved See eye doctor if worsens   ED Prescriptions    None     PDMP not reviewed this encounter.   Raylene Everts, MD 05/02/21 1308

## 2021-05-17 DIAGNOSIS — I428 Other cardiomyopathies: Secondary | ICD-10-CM | POA: Insufficient documentation

## 2021-05-17 NOTE — Progress Notes (Incomplete)
ELECTROPHYSIOLOGY OFFICE NOTE  Patient ID: [name] [name], MRN: [id number], DOB/AGE: [birth year] 55 y.o. Admit date: (Not on file) Date of Consult: 05/18/2021  Primary Physician: [name], PA-C Primary Cardiologist: *DB     Kiyon Fidalgo [name] [name] is a 55 y.o. male who is being seen today for the evaluation of CRT at the request of DB.    HPI DONNA SILVERMAN is a 55 y.o. male referred for consideration of CRT in the context of end-stage renal disease on hemodialysis currently being [facility]  Initially diagnosed 1/20 and found at that time to have a left bundle branch block; started on guideline directed therapy and repeat evaluation 6/20 demonstrated basically unchanged LV function and remained unchanged through 7/21. cMRI as noted below demonstrated an ejection fraction of 34%; apparently there is a recent [facility] with an ejection fraction of 45% and a Myoview scan pending  Referred to Dr. Norville Haggard who recommended CRT if his LV function remained depressed.  He quoted about a 70-75% chance of success in a 4-5% chance of infection.   At that point the patient wanted to hold off and see if his ejection fraction improved with further efforts  Functional status is quite good.  He has 20 stairs at home and is short of breath at the top of the stairs.  The patient denies chest pain***, shortness of breath***, nocturnal dyspnea***, orthopnea*** or peripheral edema***.  There have been no palpitations***, lightheadedness*** or syncope***.  Complains of ***.  Today, ***  DATE TEST EF   1/20 Echo   20-25 %   1/20 LHC  D-50%  7/21 Echo   35-40 %   3/22 cMRI 34% LGE neg  3/22 Echo (CE)  45%    Date Cr K Hgb  2/22? 11.36 4.8 11.6          Past Medical History:  Diagnosis Date  . Bacteremia   . Diabetes mellitus   . Dialysis patient ([place])   . Hypertension   . Pneumonia 02/2012   Strep pneumoniae  bilateral pneumonia complicated by bacteremia  . Renal disorder    End stage, not on dialysis      Surgical History:  Past Surgical History:  Procedure Laterality Date  . AMPUTATION TOE    . AV FISTULA PLACEMENT Left 05/22/2018   Procedure: Creation of BrachiaCephalic Fistula Left Arm;  Surgeon: Angelia Mould, MD;  Location: [place];  Service: Vascular;  Laterality: Left;  . BASCILIC VEIN TRANSPOSITION Left 11/12/2018   Procedure: BASILIC VEIN TRANSPOSITION LEFT ARM;  Surgeon: Serafina Mitchell, MD;  Location: [place];  Service: Vascular;  Laterality: Left;  . CHEST TUBE INSERTION     placed during hospitalization 02/2012  . COLONOSCOPY  04/01/2012   Procedure: COLONOSCOPY;  Surgeon: Juanita Craver, MD;  Location: [facility] ENDOSCOPY;  Service: Endoscopy;  Laterality: N/A;  . ESOPHAGOGASTRODUODENOSCOPY  03/31/2012   Procedure: ESOPHAGOGASTRODUODENOSCOPY (EGD);  Surgeon: Beryle Beams, MD;  Location[facility]OSCOPY;  Service: Endoscopy;  Laterality: N/A;  . EXCHANGE OF A DIALYSIS CATHETER Right 05/22/2018   Procedure: EXCHANGE OF A DIALYSIS CATHETER;  Surgeon: Angelia Mould, MD;  Location: [place];  Service: Vascular;  Laterality: Right;  . IR FLUORO GUIDE CV LINE RIGHT  05/20/2018  . IR THROMBECTOMY AV FISTULA W/THROMBOLYSIS INC/SHUNT/IMG LEFT Left 09/10/2018  . IR US GUIDE VASC ACCESS LEFT  09/10/2018  . IR US GUIDE  VASC ACCESS RIGHT  05/20/2018     Home Meds: No outpatient medications have been marked as taking for the 05/18/21 encounter (Appointment) with Deboraha Sprang, MD.    Allergies:  Allergies  Allergen Reactions  . Penicillins Rash and Other (See Comments)    Tolerating Ceftriaxone (03/12/12) Has patient had a PCN reaction causing immediate rash, facial/tongue/throat swelling, SOB or lightheadedness with hypotension: Yes Has patient had a PCN reaction causing severe rash involving mucus membranes or skin necrosis: Unk Has patient had a PCN reaction that required hospitalization:  No Has patient had a PCN reaction occurring within the last 10 years: No If all of the above answers are "NO", then may proceed with Cephalosporin use.     Social History   Socioeconomic History  . Marital status: Single    Spouse name: Not on file  . Number of children: Not on file  . Years of education: Not on file  . Highest education level: Not on file  Occupational History  . Occupation: Information systems manager: [employer]  Tobacco Use  . Smoking status: Never Smoker  . Smokeless tobacco: Never Used  Substance and Sexual Activity  . Alcohol use: Yes    Comment: rare   . Drug use: No  . Sexual activity: Not on file  Other Topics Concern  . Not on file  Social History Narrative   He is from [place] and lives at home here with his daughter. He is divorced. 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    Social Determinants of Health   Financial Resource Strain: Not on file  Food Insecurity: Not on file  Transportation Needs: Not on file  Physical Activity: Not on file  Stress: Not on file  Social Connections: Not on file  Intimate Partner Violence: Not on file     Family History  Problem Relation Age of Onset  . Diabetes Maternal Grandmother      ROS:  Please see the history of present illness. All other systems reviewed and negative.    Physical Exam: There were no vitals taken for this visit. Well developed and nourished in no acute distress HENT normal Neck supple with JVP-  flat *** Clear Regular rate and rhythm, no murmurs or gallops Abd-soft with active BS No Clubbing cyanosis edema Skin-warm and dry A & Oriented  Grossly normal sensory and motor function    Labs: Cardiac Enzymes No results for input(s): CKTOTAL, CKMB, TROPONINI in the last 72 hours. CBC Lab Results  Component Value Date   WBC 10.1 02/06/2019   HGB 11.7 (L) 02/06/2019   HCT 38.1 (L) 02/06/2019   MCV 95.3 02/06/2019   PLT 130 (L)  02/06/2019   PROTIME: No results for input(s): LABPROT, INR in the last 72 hours. Chemistry No results for input(s): NA, K, CL, CO2, BUN, CREATININE, CALCIUM, PROT, BILITOT, ALKPHOS, ALT, AST, GLUCOSE in the last 168 hours.  Invalid input(s): LABALBU Lipids Lab Results  Component Value Date   CHOL 159 04/10/2020   HDL 40.90 04/10/2020   LDLCALC 97 04/10/2020   TRIG 104.0 04/10/2020   BNP Pro B Natriuretic peptide (BNP)  Date/Time Value Ref Range Status  03/28/2012 12:08 AM 178.6 (H) 0 - 125 pg/mL Final   Thyroid Function Tests: No results for input(s): TSH, T4TOTAL, T3FREE, THYROIDAB in the last 72 hours.  Invalid input(s): FREET3 Miscellaneous No results found for: DDIMER  Radiology/Studies:  No results  found.  Assessment and Plan:  Nonischemic cardiomyopathy ejection fraction somewhere between 35 and 45% based on recent measurements  Congestive heart failure-chronic-systolic-class II  Left bundle branch block  Evaluation pending for transplant of kidney plus minus pancreas  Hypertension  End-stage renal disease on dialysis   The patient has modest heart failure symptoms on guideline directed therapy.  He is being considered for transplant and has a barrier to further evaluation based on his ejection fraction.  [facility] [place] [facility] alone is 35% and at [facility] for kidney plus pancreas it is 45%.  cMRI demonstrated EF of 34% and the most recent echo [facility] apparently was about 45% with Myoview result pending.  Given the paucity of the symptoms of the borderline ejection fraction, I have suggested that we wait until he has a discussion with [facility] regarding their assessment of the [facility] from [facility].  If his ejection fraction is acceptable at its current level, and given the paucity of his symptoms, and given the increased risk of infection associated with the device because of renal replacement therapy, I would be inclined to defer device  implantation.  However, if his symptoms worsen, his ejection fraction is not as most recently reported, or if his ejection fraction remains a barrier to consideration for transplant given the lack of gadolinium enhancement and his nonischemic myopathy I suspect his response to CRT would be 70-75% as estimated by Dr. Rosita Fire.  We had an extensive discussion regarding his nonischemic cardiomyopathy, left bundle branch block and resynchronization and aforementioned issues related to his transplant evaluation   I,Alexis Bryant,acting as a scribe for Virl Axe, MD.,have documented all relevant documentation on the behalf of Virl Axe, MD,as directed by  Virl Axe, MD while in the presence of Virl Axe, MD.   *** Lucille Passy

## 2021-05-18 ENCOUNTER — Ambulatory Visit: Payer: 59 | Admitting: Internal Medicine

## 2021-05-18 DIAGNOSIS — R002 Palpitations: Secondary | ICD-10-CM

## 2021-05-18 DIAGNOSIS — I5022 Chronic systolic (congestive) heart failure: Secondary | ICD-10-CM

## 2021-05-18 DIAGNOSIS — I428 Other cardiomyopathies: Secondary | ICD-10-CM

## 2021-06-05 ENCOUNTER — Ambulatory Visit: Payer: 59 | Admitting: Internal Medicine

## 2021-06-22 ENCOUNTER — Inpatient Hospital Stay (HOSPITAL_BASED_OUTPATIENT_CLINIC_OR_DEPARTMENT_OTHER)
Admission: EM | Admit: 2021-06-22 | Discharge: 2021-06-26 | DRG: 417 | Disposition: A | Discharge status: Home / Self Care | Payer: Medicare Other | Attending: Internal Medicine | Admitting: Internal Medicine

## 2021-06-22 ENCOUNTER — Encounter (HOSPITAL_BASED_OUTPATIENT_CLINIC_OR_DEPARTMENT_OTHER): Payer: Self-pay

## 2021-06-22 ENCOUNTER — Other Ambulatory Visit: Payer: Self-pay

## 2021-06-22 ENCOUNTER — Emergency Department (HOSPITAL_BASED_OUTPATIENT_CLINIC_OR_DEPARTMENT_OTHER): Payer: Medicare Other

## 2021-06-22 DIAGNOSIS — R7989 Other specified abnormal findings of blood chemistry: Secondary | ICD-10-CM

## 2021-06-22 DIAGNOSIS — R748 Abnormal levels of other serum enzymes: Secondary | ICD-10-CM | POA: Diagnosis present

## 2021-06-22 DIAGNOSIS — D638 Anemia in other chronic diseases classified elsewhere: Secondary | ICD-10-CM | POA: Diagnosis present

## 2021-06-22 DIAGNOSIS — R1011 Right upper quadrant pain: Secondary | ICD-10-CM

## 2021-06-22 DIAGNOSIS — R112 Nausea with vomiting, unspecified: Secondary | ICD-10-CM | POA: Diagnosis present

## 2021-06-22 DIAGNOSIS — D631 Anemia in chronic kidney disease: Secondary | ICD-10-CM | POA: Diagnosis present

## 2021-06-22 DIAGNOSIS — Z833 Family history of diabetes mellitus: Secondary | ICD-10-CM

## 2021-06-22 DIAGNOSIS — I5032 Chronic diastolic (congestive) heart failure: Secondary | ICD-10-CM | POA: Diagnosis present

## 2021-06-22 DIAGNOSIS — R109 Unspecified abdominal pain: Secondary | ICD-10-CM | POA: Diagnosis not present

## 2021-06-22 DIAGNOSIS — Z9889 Other specified postprocedural states: Secondary | ICD-10-CM

## 2021-06-22 DIAGNOSIS — K8000 Calculus of gallbladder with acute cholecystitis without obstruction: Secondary | ICD-10-CM | POA: Diagnosis not present

## 2021-06-22 DIAGNOSIS — E1122 Type 2 diabetes mellitus with diabetic chronic kidney disease: Secondary | ICD-10-CM | POA: Diagnosis present

## 2021-06-22 DIAGNOSIS — Z20822 Contact with and (suspected) exposure to covid-19: Secondary | ICD-10-CM | POA: Diagnosis present

## 2021-06-22 DIAGNOSIS — I132 Hypertensive heart and chronic kidney disease with heart failure and with stage 5 chronic kidney disease, or end stage renal disease: Secondary | ICD-10-CM | POA: Diagnosis present

## 2021-06-22 DIAGNOSIS — E1151 Type 2 diabetes mellitus with diabetic peripheral angiopathy without gangrene: Secondary | ICD-10-CM | POA: Diagnosis present

## 2021-06-22 DIAGNOSIS — E11649 Type 2 diabetes mellitus with hypoglycemia without coma: Secondary | ICD-10-CM | POA: Diagnosis present

## 2021-06-22 DIAGNOSIS — Z79899 Other long term (current) drug therapy: Secondary | ICD-10-CM

## 2021-06-22 DIAGNOSIS — Z794 Long term (current) use of insulin: Secondary | ICD-10-CM

## 2021-06-22 DIAGNOSIS — E1142 Type 2 diabetes mellitus with diabetic polyneuropathy: Secondary | ICD-10-CM | POA: Diagnosis present

## 2021-06-22 DIAGNOSIS — E785 Hyperlipidemia, unspecified: Secondary | ICD-10-CM | POA: Diagnosis present

## 2021-06-22 DIAGNOSIS — Z7982 Long term (current) use of aspirin: Secondary | ICD-10-CM

## 2021-06-22 DIAGNOSIS — K8010 Calculus of gallbladder with chronic cholecystitis without obstruction: Secondary | ICD-10-CM | POA: Diagnosis present

## 2021-06-22 DIAGNOSIS — K8042 Calculus of bile duct with acute cholecystitis without obstruction: Secondary | ICD-10-CM | POA: Diagnosis present

## 2021-06-22 DIAGNOSIS — I5042 Chronic combined systolic (congestive) and diastolic (congestive) heart failure: Secondary | ICD-10-CM | POA: Diagnosis present

## 2021-06-22 DIAGNOSIS — Z992 Dependence on renal dialysis: Secondary | ICD-10-CM

## 2021-06-22 DIAGNOSIS — N186 End stage renal disease: Secondary | ICD-10-CM

## 2021-06-22 DIAGNOSIS — N2581 Secondary hyperparathyroidism of renal origin: Secondary | ICD-10-CM | POA: Diagnosis present

## 2021-06-22 DIAGNOSIS — E1169 Type 2 diabetes mellitus with other specified complication: Secondary | ICD-10-CM | POA: Diagnosis present

## 2021-06-22 DIAGNOSIS — I10 Essential (primary) hypertension: Secondary | ICD-10-CM | POA: Diagnosis present

## 2021-06-22 DIAGNOSIS — R197 Diarrhea, unspecified: Secondary | ICD-10-CM

## 2021-06-22 DIAGNOSIS — Z7682 Awaiting organ transplant status: Secondary | ICD-10-CM

## 2021-06-22 LAB — CBC WITH DIFFERENTIAL/PLATELET
Abs Immature Granulocytes: 0.03 10*3/uL (ref 0.00–0.07)
Basophils Absolute: 0.1 10*3/uL (ref 0.0–0.1)
Basophils Relative: 1 %
Eosinophils Absolute: 0.5 10*3/uL (ref 0.0–0.5)
Eosinophils Relative: 7 %
HCT: 30.5 % — ABNORMAL LOW (ref 39.0–52.0)
Hemoglobin: 9.7 g/dL — ABNORMAL LOW (ref 13.0–17.0)
Immature Granulocytes: 0 %
Lymphocytes Relative: 15 %
Lymphs Abs: 1 10*3/uL (ref 0.7–4.0)
MCH: 31.8 pg (ref 26.0–34.0)
MCHC: 31.8 g/dL (ref 30.0–36.0)
MCV: 100 fL (ref 80.0–100.0)
Monocytes Absolute: 0.5 10*3/uL (ref 0.1–1.0)
Monocytes Relative: 8 %
Neutro Abs: 4.8 10*3/uL (ref 1.7–7.7)
Neutrophils Relative %: 69 %
Platelets: 204 10*3/uL (ref 150–400)
RBC: 3.05 MIL/uL — ABNORMAL LOW (ref 4.22–5.81)
RDW: 14.6 % (ref 11.5–15.5)
WBC: 6.9 10*3/uL (ref 4.0–10.5)
nRBC: 0 % (ref 0.0–0.2)

## 2021-06-22 LAB — COMPREHENSIVE METABOLIC PANEL
ALT: 547 U/L — ABNORMAL HIGH (ref 0–44)
AST: 431 U/L — ABNORMAL HIGH (ref 15–41)
Albumin: 3.6 g/dL (ref 3.5–5.0)
Alkaline Phosphatase: 229 U/L — ABNORMAL HIGH (ref 38–126)
Anion gap: 10 (ref 5–15)
BUN: 44 mg/dL — ABNORMAL HIGH (ref 6–20)
CO2: 30 mmol/L (ref 22–32)
Calcium: 8.2 mg/dL — ABNORMAL LOW (ref 8.9–10.3)
Chloride: 97 mmol/L — ABNORMAL LOW (ref 98–111)
Creatinine, Ser: 6.75 mg/dL — ABNORMAL HIGH (ref 0.61–1.24)
GFR, Estimated: 9 mL/min — ABNORMAL LOW (ref >60)
Glucose, Bld: 85 mg/dL (ref 70–99)
Potassium: 4.4 mmol/L (ref 3.5–5.1)
Sodium: 137 mmol/L (ref 135–145)
Total Bilirubin: 5 mg/dL — ABNORMAL HIGH (ref 0.3–1.2)
Total Protein: 7.4 g/dL (ref 6.5–8.1)

## 2021-06-22 LAB — LIPASE, BLOOD: Lipase: 37 U/L (ref 11–51)

## 2021-06-22 MED ORDER — ALUM & MAG HYDROXIDE-SIMETH 200-200-20 MG/5ML PO SUSP
30.0000 mL | Freq: Once | ORAL | Status: AC
  Administered 2021-06-22: 30 mL via ORAL
  Filled 2021-06-22: qty 30

## 2021-06-22 NOTE — ED Notes (Signed)
Pt states he had neg Covid test yesterday and refuses Covid test today.

## 2021-06-22 NOTE — ED Provider Notes (Signed)
[facility] EMERGENCY DEPARTMENT Provider Note   CSN: [id number] Arrival date & time: 06/22/21  1719     History Chief Complaint  Patient presents with   Abdominal Pain    [name] [name] is a 55 y.o. male.  55 yo M with a chief complaints of having elevated LFTs done at his doctor's office yesterday.  Patient yesterday had nausea and vomiting had gone to see his doctor for this and had had resolution by the time he saw them in the office.  Was having some epigastric abdominal pain with it as well.  His liver function tests were elevated they called him back and told him he should come to the ED to be evaluated.  Has had no symptoms today.  Has been able to eat and drink without issue.  Had episode like this similarly about a month or so ago when he was in [place].  He thought maybe he had heatstroke and thought the same of yesterday.  The history is provided by the patient.  Abdominal Pain Pain location:  Epigastric Pain quality: aching   Pain radiates to:  Does not radiate Pain severity:  Moderate Onset quality:  Gradual Duration:  12 hours Timing:  Constant Progression:  Resolved Chronicity:  New Relieved by:  Nothing Worsened by:  Nothing Ineffective treatments:  None tried Associated symptoms: nausea and vomiting   Associated symptoms: no chest pain, no chills, no diarrhea, no fever and no shortness of breath       Past Medical History:  Diagnosis Date   Bacteremia    Diabetes mellitus    Dialysis patient [facility])    Hypertension    Pneumonia 02/2012   Strep pneumoniae bilateral pneumonia complicated by bacteremia   Renal disorder    End stage, not on dialysis    Patient Active Problem List   Diagnosis Date Noted   Acute cholecystitis 06/22/2021   NICM (nonischemic cardiomyopathy) ([place]) 52/84/1324   Chronic systolic heart failure ([place]) 03/06/2021   Elbow pain 07/03/2018   Nausea & vomiting 05/20/2018   Acute respiratory failure with hypoxia  ([place]) 05/19/2018   Type II diabetes mellitus with renal manifestations ([place]) 05/19/2018   Anemia 05/19/2018   Hypocalcemia 05/19/2018   HCAP (healthcare-associated pneumonia) 05/19/2018   ESRD (end stage renal disease) ([place])    CKD (chronic kidney disease), stage V ([place]) 03/02/2018   Acute on chronic diastolic CHF (congestive heart failure) ([place]) 03/02/2018   CAP (community acquired pneumonia) 03/02/2018   AKI (acute kidney injury) ([place]) 01/29/2017   Palpitations 01/13/2017   Essential hypertension, benign 10/26/2013   Dyspnea 06/09/2012   Dizziness 06/09/2012   Pneumonia, organism unspecified([place]) 04/07/2012   Pleural effusion 04/07/2012   Parapneumonic effusion 03/28/2012   Symptomatic anemia 03/28/2012   Hypoalbuminemia 03/28/2012   Total bilirubin, elevated 03/28/2012   Bilateral leg edema 03/23/2012   Cyst of skin 03/23/2012   Bacteremia due to Streptococcus pneumoniae 03/15/2012   Hyponatremia 03/15/2012   Acute kidney failure 03/13/2012   Elevated LFTs 03/13/2012   Pneumococcal pneumonia ([place]) 03/12/2012   Insulin-requiring or dependent type II diabetes mellitus ([place]) 03/12/2012    Past Surgical History:  Procedure Laterality Date   AMPUTATION TOE     AV FISTULA PLACEMENT Left 05/22/2018   Procedure: Creation of BrachiaCephalic Fistula Left Arm;  Surgeon: Angelia Mould, MD;  Location: [place];  Service: Vascular;  Laterality: Left;   [place] Left 11/12/2018   Procedure: BASILIC VEIN TRANSPOSITION LEFT ARM;  Surgeon:  Serafina Mitchell, MD;  Location[facility];  Service: Vascular;  Laterality: Left;   CHEST TUBE INSERTION     placed during hospitalization 02/2012   COLONOSCOPY  04/01/2012   Procedure: COLONOSCOPY;  Surgeon: Juanita Craver, MD;  Location: [facility] ENDOSCOPY;  Service: Endoscopy;  Laterality: N/A;   ESOPHAGOGASTRODUODENOSCOPY  03/31/2012   Procedure: ESOPHAGOGASTRODUODENOSCOPY (EGD);  Surgeon: Beryle Beams, MD;  Location: [facility] ENDOSCOPY;   Service: Endoscopy;  Laterality: N/A;   EXCHANGE OF A DIALYSIS CATHETER Right 05/22/2018   Procedure: EXCHANGE OF A DIALYSIS CATHETER;  Surgeon: Angelia Mould, MD;  Location: [facility] OR;  Service: Vascular;  Laterality: Right;   IR FLUORO GUIDE CV LINE RIGHT  05/20/2018   IR THROMBECTOMY AV FISTULA W/THROMBOLYSIS INC/SHUNT/IMG LEFT Left 09/10/2018   IR US GUIDE VASC ACCESS LEFT  09/10/2018   IR US GUIDE VASC ACCESS RIGHT  05/20/2018       Family History  Problem Relation Age of Onset   Diabetes Maternal Grandmother     Social History   Tobacco Use   Smoking status: Never   Smokeless tobacco: Never  Substance Use Topics   Alcohol use: Yes    Comment: rare    Drug use: No    Home Medications Prior to Admission medications   Medication Sig Start Date End Date Taking? Authorizing Provider  aspirin 81 MG tablet Take 81 mg by mouth every morning.     [provider]  b complex-vitamin c-folic acid (NEPHRO-VITE) 0.8 MG TABS tablet Take 1 tablet by mouth daily.    [provider]  Blood Glucose Monitoring Suppl ([place]) w/Device KIT Use to check blood sugar twice daily. 04/21/20   Elayne Snare, MD  calcium elemental as carbonate (BARIATRIC TUMS ULTRA) 400 MG chewable tablet Chew 2,000 mg by mouth 3 (three) times daily.    [provider]  carvedilol (COREG) 25 MG tablet Take 1.5 tablets (37.5 mg total) by mouth 2 (two) times daily with a meal. 03/25/17   Troy Sine, MD  Continuous Blood Gluc Receiver (FREESTYLE LIBRE 14 DAY READER) DEVI 1 each by Does not apply route See admin instructions. Use to monitor blood sugars. 03/13/20   Elayne Snare, MD  Continuous Blood Gluc Sensor (FREESTYLE LIBRE 2 SENSOR) MISC USE AS DIRECTED 01/30/20   Elayne Snare, MD  ethyl chloride spray Apply 1 application topically 3 (three) times a week. Apply 1 spray to skin three times weekly.    [provider]  gabapentin (NEURONTIN) 100 MG capsule Take 100 mg  by mouth 3 (three) times daily as needed.    [provider]  glucose blood test strip Use Onetouch verio test strips as instructed to check blood sugar twice daily. 04/21/20   Elayne Snare, MD  insulin aspart (NOVOLOG FLEXPEN) 100 UNIT/ML FlexPen 5 to 7 units for breakfast and lunch and 10 to 14 units at dinner based on meal size and carbohydrateS 12/10/19   Elayne Snare, MD  insulin degludec (TRESIBA FLEXTOUCH) 100 UNIT/ML FlexTouch Pen Inject 0.38 mLs (38 Units total) into the skin daily. 04/12/20   Elayne Snare, MD  ivabradine (CORLANOR) 5 MG TABS tablet Take 5 mg by mouth 2 (two) times daily with a meal.    [provider]  lanthanum (FOSRENOL) 1000 MG chewable tablet Chew 1 tablet (1,000 mg total) by mouth 3 (three) times daily with meals. Patient taking differently: Chew 2,000 mg by mouth 3 (three) times daily with meals. 05/25/18  Domenic Polite, MD  OneTouch Delica Lancets 65L MISC Use onetouch delica lancets to check blood sugar twice daily. 04/21/20   Elayne Snare, MD  sacubitril-valsartan (ENTRESTO) 49-51 MG Take 1 tablet by mouth 2 (two) times daily.    [provider]  Vitamin D, Ergocalciferol, (DRISDOL) 1.25 MG (50000 UNIT) CAPS capsule Take 50,000 Units by mouth every 7 (seven) days.    [provider]    Allergies    Penicillins  Review of Systems   Review of Systems  Constitutional:  Negative for chills and fever.  HENT:  Negative for congestion and facial swelling.   Eyes:  Negative for discharge and visual disturbance.  Respiratory:  Negative for shortness of breath.   Cardiovascular:  Negative for chest pain and palpitations.  Gastrointestinal:  Positive for abdominal pain, nausea and vomiting. Negative for diarrhea.  Musculoskeletal:  Negative for arthralgias and myalgias.  Skin:  Negative for color change and rash.  Neurological:  Negative for tremors, syncope and headaches.  Psychiatric/Behavioral:  Negative for confusion and dysphoric  mood.    Physical Exam Updated Vital Signs BP (!) 158/83   Pulse 65   Temp 98.4 F (36.9 C) (Oral)   Resp 14   Ht _0  (1.981 m)   Wt 88.5 kg   SpO2 96%   BMI 22.53 kg/m   Physical Exam Vitals and nursing note reviewed.  Constitutional:      Appearance: He is well-developed.  HENT:     Head: Normocephalic and atraumatic.  Eyes:     Pupils: Pupils are equal, round, and reactive to light.  Neck:     Vascular: No JVD.  Cardiovascular:     Rate and Rhythm: Normal rate and regular rhythm.     Heart sounds: No murmur heard.   No friction rub. No gallop.  Pulmonary:     Effort: No respiratory distress.     Breath sounds: No wheezing.  Abdominal:     General: There is no distension.     Tenderness: There is no abdominal tenderness. There is no guarding or rebound.  Musculoskeletal:        General: Normal range of motion.     Cervical back: Normal range of motion and neck supple.  Skin:    Coloration: Skin is not pale.     Findings: No rash.  Neurological:     Mental Status: He is alert and oriented to person, place, and time.  Psychiatric:        Behavior: Behavior normal.    ED Results / Procedures / Treatments   Labs (all labs ordered are listed, but only abnormal results are displayed) Labs Reviewed  CBC WITH DIFFERENTIAL/PLATELET - Abnormal; Notable for the following components:      Result Value   RBC 3.05 (*)    Hemoglobin 9.7 (*)    HCT 30.5 (*)    All other components within normal limits  COMPREHENSIVE METABOLIC PANEL - Abnormal; Notable for the following components:   Chloride 97 (*)    BUN 44 (*)    Creatinine, Ser 6.75 (*)    Calcium 8.2 (*)    AST 431 (*)    ALT 547 (*)    Alkaline Phosphatase 229 (*)    Total Bilirubin 5.0 (*)    GFR, Estimated 9 (*)    All other components within normal limits  RESP PANEL BY RT-PCR (FLU A&B, COVID) ARPGX2  LIPASE, BLOOD    EKG EKG Interpretation  Date/Time:  Friday June 22 2021 21:38:22  EDT Ventricular Rate:  68 PR Interval:  200 QRS Duration: 171 QT Interval:  500 QTC Calculation: 532 R Axis:   -46 Text Interpretation: Sinus rhythm Ventricular premature complex Consider left atrial enlargement Left bundle branch block No significant change since last tracing Confirmed by Deno Etienne [phone]) on 06/22/2021 9:45:17 PM  Radiology US Abdomen Limited RUQ (LIVER/GB)  Result Date: 06/22/2021 CLINICAL DATA:  Abdominal pain, nausea, abnormal labs. EXAM: ULTRASOUND ABDOMEN LIMITED RIGHT UPPER QUADRANT COMPARISON:  CT 01/22/2021 FINDINGS: Gallbladder: Cholelithiasis with multiple small stones filling the gallbladder. Gallbladder wall thickening with wall thickness measuring 4.1 mm. Mild edema. Murphy's sign is negative. Common bile duct: Diameter: 6 mm, normal Liver: No focal lesion identified. Within normal limits in parenchymal echogenicity. Portal vein is patent on color Doppler imaging with normal direction of blood flow towards the liver. Other: None. IMPRESSION: Cholelithiasis with gallbladder wall thickening and edema. This may indicate cholecystitis in the appropriate clinical setting although the Murphy's sign is negative. Electronically Signed   By: Lucienne Capers M.D.   On: 06/22/2021 22:01    Procedures Procedures   Medications Ordered in ED Medications  alum & mag hydroxide-simeth (MAALOX/MYLANTA) 200-200-20 MG/5ML suspension 30 mL (30 mLs Oral Given 06/22/21 2136)    ED Course  I have reviewed the triage vital signs and the nursing notes.  Pertinent labs & imaging results that were available during my care of the patient were reviewed by me and considered in my medical decision making (see chart for details).    MDM Rules/Calculators/A&P                          55 yo M with a chief complaints of epigastric abdominal pain nausea and vomiting.  This occurred yesterday lasted for about 12 hours or so and then resolved.  He saw his family doctor after it had improved and  he had lab work done that showed elevation of his liver function test and total bilirubin.  I reviewed his lab work.  We will repeat here today.  Right upper quadrant ultrasound.  LFTs and total bilirubin are elevated compared to yesterday.  Right upper quadrant ultrasound with gallstones gallbladder wall thickening and pericholecystic fluid.  No pain on exam.  No leukocytosis.  I discussed the case with Dr. Barron Schmid gastroenterology recommended MRCP and hospital admission.  As the patient is end-stage renal disease will admit to [facility].  Likely will need to consult the on-call GI doc in the morning.  The patients results and plan were reviewed and discussed.   Any x-rays performed were independently reviewed by myself.   Differential diagnosis were considered with the presenting HPI.  Medications  alum & mag hydroxide-simeth (MAALOX/MYLANTA) 200-200-20 MG/5ML suspension 30 mL (30 mLs Oral Given 06/22/21 2136)    Vitals:   06/22/21 2130 06/22/21 2200 06/22/21 2230 06/22/21 2300  BP: (!) 159/80 (!) 159/81 (!) 150/73 (!) 158/83  Pulse: 66 67 65 65  Resp: _0 Temp:      TempSrc:      SpO2: 97% 98% 95% 96%  Weight:      Height:        Final diagnoses:  RUQ abdominal pain  LFT elevation    Admission/ observation were discussed with the admitting physician, patient and/or family and they are comfortable with the plan.     Final Clinical Impression(s) / ED Diagnoses Final diagnoses:  RUQ abdominal pain  LFT elevation    Rx / DC Orders ED Discharge Orders     None        Deno Etienne, DO 06/22/21 2353

## 2021-06-22 NOTE — ED Triage Notes (Signed)
Pt c/o intermittent abd pain, n/v-states he was seen by PCP yesterday-called today and advised to come to ED due to abnormal labs-NAD-steady gait

## 2021-06-22 NOTE — ED Notes (Addendum)
RESTRICTED EXTREMITY bracelet applied to left arm due to AV fistula in place, positive thrill and bruit is noted

## 2021-06-22 NOTE — ED Notes (Signed)
Saw primary MD yesterday, was called and told his LFT and bilirubin were elevated. Pt states "he has not felt like himself lately" Denies any nausea or vomiting today. No diarrhea, appetite has been "very cautious". Light meal last PM and this am and tolerated very well.

## 2021-06-22 NOTE — ED Notes (Signed)
Pt instructed to remain NPO until further notice 

## 2021-06-23 ENCOUNTER — Encounter (HOSPITAL_COMMUNITY): Payer: Self-pay | Admitting: Internal Medicine

## 2021-06-23 ENCOUNTER — Inpatient Hospital Stay (HOSPITAL_COMMUNITY): Payer: Medicare Other

## 2021-06-23 DIAGNOSIS — K8000 Calculus of gallbladder with acute cholecystitis without obstruction: Principal | ICD-10-CM

## 2021-06-23 DIAGNOSIS — D638 Anemia in other chronic diseases classified elsewhere: Secondary | ICD-10-CM | POA: Diagnosis not present

## 2021-06-23 DIAGNOSIS — I5032 Chronic diastolic (congestive) heart failure: Secondary | ICD-10-CM

## 2021-06-23 DIAGNOSIS — E1169 Type 2 diabetes mellitus with other specified complication: Secondary | ICD-10-CM | POA: Diagnosis present

## 2021-06-23 DIAGNOSIS — Z992 Dependence on renal dialysis: Secondary | ICD-10-CM | POA: Diagnosis not present

## 2021-06-23 DIAGNOSIS — K8050 Calculus of bile duct without cholangitis or cholecystitis without obstruction: Secondary | ICD-10-CM | POA: Diagnosis not present

## 2021-06-23 DIAGNOSIS — N186 End stage renal disease: Secondary | ICD-10-CM

## 2021-06-23 DIAGNOSIS — Z7682 Awaiting organ transplant status: Secondary | ICD-10-CM | POA: Diagnosis not present

## 2021-06-23 DIAGNOSIS — I10 Essential (primary) hypertension: Secondary | ICD-10-CM

## 2021-06-23 DIAGNOSIS — Z833 Family history of diabetes mellitus: Secondary | ICD-10-CM | POA: Diagnosis not present

## 2021-06-23 DIAGNOSIS — R112 Nausea with vomiting, unspecified: Secondary | ICD-10-CM | POA: Diagnosis not present

## 2021-06-23 DIAGNOSIS — R197 Diarrhea, unspecified: Secondary | ICD-10-CM

## 2021-06-23 DIAGNOSIS — E11649 Type 2 diabetes mellitus with hypoglycemia without coma: Secondary | ICD-10-CM | POA: Diagnosis present

## 2021-06-23 DIAGNOSIS — R748 Abnormal levels of other serum enzymes: Secondary | ICD-10-CM | POA: Diagnosis not present

## 2021-06-23 DIAGNOSIS — Z79899 Other long term (current) drug therapy: Secondary | ICD-10-CM | POA: Diagnosis not present

## 2021-06-23 DIAGNOSIS — Z20822 Contact with and (suspected) exposure to covid-19: Secondary | ICD-10-CM | POA: Diagnosis present

## 2021-06-23 DIAGNOSIS — E1142 Type 2 diabetes mellitus with diabetic polyneuropathy: Secondary | ICD-10-CM | POA: Diagnosis present

## 2021-06-23 DIAGNOSIS — R109 Unspecified abdominal pain: Secondary | ICD-10-CM | POA: Diagnosis present

## 2021-06-23 DIAGNOSIS — E1122 Type 2 diabetes mellitus with diabetic chronic kidney disease: Secondary | ICD-10-CM | POA: Diagnosis present

## 2021-06-23 DIAGNOSIS — N2581 Secondary hyperparathyroidism of renal origin: Secondary | ICD-10-CM | POA: Diagnosis present

## 2021-06-23 DIAGNOSIS — D631 Anemia in chronic kidney disease: Secondary | ICD-10-CM | POA: Diagnosis present

## 2021-06-23 DIAGNOSIS — I5042 Chronic combined systolic (congestive) and diastolic (congestive) heart failure: Secondary | ICD-10-CM | POA: Diagnosis present

## 2021-06-23 DIAGNOSIS — Z794 Long term (current) use of insulin: Secondary | ICD-10-CM

## 2021-06-23 DIAGNOSIS — E782 Mixed hyperlipidemia: Secondary | ICD-10-CM

## 2021-06-23 DIAGNOSIS — I132 Hypertensive heart and chronic kidney disease with heart failure and with stage 5 chronic kidney disease, or end stage renal disease: Secondary | ICD-10-CM | POA: Diagnosis present

## 2021-06-23 DIAGNOSIS — E785 Hyperlipidemia, unspecified: Secondary | ICD-10-CM | POA: Diagnosis present

## 2021-06-23 DIAGNOSIS — E1151 Type 2 diabetes mellitus with diabetic peripheral angiopathy without gangrene: Secondary | ICD-10-CM | POA: Diagnosis present

## 2021-06-23 DIAGNOSIS — Z7982 Long term (current) use of aspirin: Secondary | ICD-10-CM | POA: Diagnosis not present

## 2021-06-23 DIAGNOSIS — K8042 Calculus of bile duct with acute cholecystitis without obstruction: Secondary | ICD-10-CM | POA: Diagnosis present

## 2021-06-23 DIAGNOSIS — K8010 Calculus of gallbladder with chronic cholecystitis without obstruction: Secondary | ICD-10-CM | POA: Diagnosis present

## 2021-06-23 LAB — APTT: aPTT: 29 seconds (ref 24–36)

## 2021-06-23 LAB — HEPATIC FUNCTION PANEL
ALT: 510 U/L — ABNORMAL HIGH (ref 0–44)
AST: 332 U/L — ABNORMAL HIGH (ref 15–41)
Albumin: 3.2 g/dL — ABNORMAL LOW (ref 3.5–5.0)
Alkaline Phosphatase: 209 U/L — ABNORMAL HIGH (ref 38–126)
Bilirubin, Direct: 2.4 mg/dL — ABNORMAL HIGH (ref 0.0–0.2)
Indirect Bilirubin: 1.6 mg/dL — ABNORMAL HIGH (ref 0.3–0.9)
Total Bilirubin: 4 mg/dL — ABNORMAL HIGH (ref 0.3–1.2)
Total Protein: 6.9 g/dL (ref 6.5–8.1)

## 2021-06-23 LAB — PROTIME-INR
INR: 1.1 (ref 0.8–1.2)
Prothrombin Time: 13.8 seconds (ref 11.4–15.2)

## 2021-06-23 LAB — BRAIN NATRIURETIC PEPTIDE: B Natriuretic Peptide: 689 pg/mL — ABNORMAL HIGH (ref 0.0–100.0)

## 2021-06-23 LAB — HEPATITIS PANEL, ACUTE
HCV Ab: NONREACTIVE
Hep A IgM: NONREACTIVE
Hep B C IgM: NONREACTIVE
Hepatitis B Surface Ag: NONREACTIVE

## 2021-06-23 LAB — HIV ANTIBODY (ROUTINE TESTING W REFLEX): HIV Screen 4th Generation wRfx: NONREACTIVE

## 2021-06-23 LAB — GLUCOSE, CAPILLARY
Glucose-Capillary: 114 mg/dL — ABNORMAL HIGH (ref 70–99)
Glucose-Capillary: 102 mg/dL — ABNORMAL HIGH (ref 70–99)
Glucose-Capillary: 67 mg/dL — ABNORMAL LOW (ref 70–99)
Glucose-Capillary: 153 mg/dL — ABNORMAL HIGH (ref 70–99)

## 2021-06-23 LAB — IRON AND TIBC
Iron: 38 ug/dL — ABNORMAL LOW (ref 45–182)
Saturation Ratios: 19 % (ref 17.9–39.5)
TIBC: 200 ug/dL — ABNORMAL LOW (ref 250–450)
UIBC: 162 ug/dL

## 2021-06-23 LAB — RESP PANEL BY RT-PCR (FLU A&B, COVID) ARPGX2
Influenza A by PCR: NEGATIVE
Influenza B by PCR: NEGATIVE
SARS Coronavirus 2 by RT PCR: NEGATIVE

## 2021-06-23 LAB — HEMOGLOBIN A1C
Hgb A1c MFr Bld: 7.7 % — ABNORMAL HIGH (ref 4.8–5.6)
Mean Plasma Glucose: 174.29 mg/dL

## 2021-06-23 LAB — GAMMA GT: GGT: 110 U/L — ABNORMAL HIGH (ref 7–50)

## 2021-06-23 LAB — VITAMIN B12: Vitamin B-12: 1589 pg/mL — ABNORMAL HIGH (ref 180–914)

## 2021-06-23 LAB — CK: Total CK: 105 U/L (ref 49–397)

## 2021-06-23 LAB — FOLATE: Folate: 43.6 ng/mL (ref >5.9)

## 2021-06-23 MED ORDER — GADOBUTROL 1 MMOL/ML IV SOLN
9.0000 mL | Freq: Once | INTRAVENOUS | Status: AC | PRN
  Administered 2021-06-23: 9 mL via INTRAVENOUS

## 2021-06-23 MED ORDER — CALCIUM CARBONATE ANTACID 500 MG PO CHEW
2000.0000 mg | CHEWABLE_TABLET | Freq: Three times a day (TID) | ORAL | Status: DC
  Administered 2021-06-23 – 2021-06-25 (×5): 2000 mg via ORAL
  Filled 2021-06-23 (×4): qty 10

## 2021-06-23 MED ORDER — ACETAMINOPHEN 650 MG RE SUPP: 650.0000 mg | Freq: Four times a day (QID) | RECTAL | Status: DC | PRN

## 2021-06-23 MED ORDER — LANTHANUM CARBONATE 500 MG PO CHEW
1000.0000 mg | CHEWABLE_TABLET | Freq: Three times a day (TID) | ORAL | Status: DC
  Administered 2021-06-23: 1000 mg via ORAL
  Filled 2021-06-23 (×3): qty 2

## 2021-06-23 MED ORDER — INSULIN GLARGINE 100 UNIT/ML ~~LOC~~ SOLN
12.0000 [IU] | Freq: Every day | SUBCUTANEOUS | Status: DC
  Filled 2021-06-23: qty 0.12

## 2021-06-23 MED ORDER — CHLORHEXIDINE GLUCONATE CLOTH 2 % EX PADS
6.0000 | MEDICATED_PAD | Freq: Every day | CUTANEOUS | Status: DC
  Administered 2021-06-23 – 2021-06-26 (×4): 6 via TOPICAL

## 2021-06-23 MED ORDER — INSULIN ASPART 100 UNIT/ML IJ SOLN: 10.0000 [IU] | Freq: Every day | INTRAMUSCULAR | Status: DC

## 2021-06-23 MED ORDER — HYDROMORPHONE HCL 1 MG/ML IJ SOLN
0.5000 mg | INTRAMUSCULAR | Status: DC | PRN
  Administered 2021-06-25: 0.5 mg via INTRAVENOUS
  Filled 2021-06-23: qty 0.5

## 2021-06-23 MED ORDER — ACETAMINOPHEN 325 MG PO TABS
650.0000 mg | ORAL_TABLET | Freq: Four times a day (QID) | ORAL | Status: DC | PRN
  Filled 2021-06-23: qty 2

## 2021-06-23 MED ORDER — SODIUM CHLORIDE 0.9 % IV SOLN
2.0000 g | INTRAVENOUS | Status: DC
  Administered 2021-06-23 – 2021-06-25 (×3): 2 g via INTRAVENOUS
  Filled 2021-06-23: qty 2
  Filled 2021-06-23 (×2): qty 20
  Filled 2021-06-23: qty 2

## 2021-06-23 MED ORDER — INSULIN ASPART 100 UNIT/ML IJ SOLN: 0.0000 [IU] | Freq: Three times a day (TID) | INTRAMUSCULAR | Status: DC

## 2021-06-23 MED ORDER — ASPIRIN EC 81 MG PO TBEC
81.0000 mg | DELAYED_RELEASE_TABLET | Freq: Every day | ORAL | Status: DC
  Administered 2021-06-23 – 2021-06-26 (×3): 81 mg via ORAL
  Filled 2021-06-23 (×4): qty 1

## 2021-06-23 MED ORDER — INSULIN ASPART 100 UNIT/ML IJ SOLN
0.0000 [IU] | Freq: Four times a day (QID) | INTRAMUSCULAR | Status: DC
  Administered 2021-06-24: 2 [IU] via SUBCUTANEOUS
  Administered 2021-06-24: 3 [IU] via SUBCUTANEOUS
  Administered 2021-06-24: 7 [IU] via SUBCUTANEOUS
  Administered 2021-06-24: 2 [IU] via SUBCUTANEOUS
  Administered 2021-06-24: 1 [IU] via SUBCUTANEOUS
  Administered 2021-06-25: 2 [IU] via SUBCUTANEOUS
  Administered 2021-06-25: 7 [IU] via SUBCUTANEOUS
  Administered 2021-06-26: 3 [IU] via SUBCUTANEOUS
  Administered 2021-06-26: 2 [IU] via SUBCUTANEOUS

## 2021-06-23 MED ORDER — IVABRADINE HCL 5 MG PO TABS
5.0000 mg | ORAL_TABLET | Freq: Two times a day (BID) | ORAL | Status: DC
  Administered 2021-06-23 – 2021-06-26 (×6): 5 mg via ORAL
  Filled 2021-06-23 (×8): qty 1

## 2021-06-23 MED ORDER — INSULIN GLARGINE 100 UNIT/ML ~~LOC~~ SOLN
15.0000 [IU] | Freq: Every day | SUBCUTANEOUS | Status: DC
  Filled 2021-06-23: qty 0.15

## 2021-06-23 MED ORDER — LANTHANUM CARBONATE 500 MG PO CHEW
3000.0000 mg | CHEWABLE_TABLET | Freq: Three times a day (TID) | ORAL | Status: DC
  Administered 2021-06-23 – 2021-06-26 (×3): 3000 mg via ORAL
  Filled 2021-06-23 (×10): qty 6

## 2021-06-23 MED ORDER — HEPARIN SODIUM (PORCINE) 5000 UNIT/ML IJ SOLN: 5000.0000 [IU] | Freq: Three times a day (TID) | INTRAMUSCULAR | Status: DC

## 2021-06-23 MED ORDER — CARVEDILOL 25 MG PO TABS
37.5000 mg | ORAL_TABLET | Freq: Two times a day (BID) | ORAL | Status: DC
  Administered 2021-06-23 – 2021-06-26 (×6): 37.5 mg via ORAL
  Filled 2021-06-23 (×7): qty 1

## 2021-06-23 MED ORDER — INSULIN GLARGINE 100 UNIT/ML ~~LOC~~ SOLN
38.0000 [IU] | Freq: Every day | SUBCUTANEOUS | Status: DC
  Filled 2021-06-23: qty 0.38

## 2021-06-23 MED ORDER — GABAPENTIN 100 MG PO CAPS: 100.0000 mg | ORAL_CAPSULE | Freq: Three times a day (TID) | ORAL | Status: DC | PRN

## 2021-06-23 MED ORDER — INSULIN ASPART 100 UNIT/ML ~~LOC~~ SOLN: 5.0000 [IU] | Freq: Two times a day (BID) | SUBCUTANEOUS | Status: DC

## 2021-06-23 MED ORDER — SACUBITRIL-VALSARTAN 97-103 MG PO TABS
1.0000 | ORAL_TABLET | Freq: Two times a day (BID) | ORAL | Status: DC
  Administered 2021-06-23 – 2021-06-26 (×6): 1 via ORAL
  Filled 2021-06-23 (×10): qty 1

## 2021-06-23 NOTE — Plan of Care (Signed)

## 2021-06-23 NOTE — Consult Note (Signed)
Reason for Consult:gallstones Referring Physician: Dr. Concepcion Elk is an 55 y.o. male.  HPI:  Pt is a 55 yo M with ESRD admitted this morning with abdominal pain/N/V for 2-3 days.   He stated that the pain was severe and was epigastric in location.  He attempted to work, but started having n/v multiple times.  He saw his PCP who ordered labs and his pain started to get better.  He was called the next day saying his liver tests were abnormal. These were repeated in the ED and were concerning for a common duct stone.  U/S showed stones and slight GB wall thickening.  Murphy's sign was negative.  He had a similar episode of pain around 3 weeks ago that resolved spontaneously  He also has a history of DM, HTN, and heart failure, but all are well managed.  He is about to be listed for a kidney transplant at [facility] in [place] and is concerned about how this may affect his listing.    Past Medical History:  Diagnosis Date   Bacteremia    Diabetes mellitus    Dialysis patient [facility])    Hypertension    Pneumonia 02/2012   Strep pneumoniae bilateral pneumonia complicated by bacteremia   Renal disorder    End stage, not on dialysis    Past Surgical History:  Procedure Laterality Date   AMPUTATION TOE     AV FISTULA PLACEMENT Left 05/22/2018   Procedure: Creation of BrachiaCephalic Fistula Left Arm;  Surgeon: Angelia Mould, MD;  Location: [place];  Service: Vascular;  Laterality: Left;   [place] Left 11/12/2018   Procedure: BASILIC VEIN TRANSPOSITION LEFT ARM;  Surgeon: Serafina Mitchell, MD;  Location: [facility];  Service: Vascular;  Laterality: Left;   CHEST TUBE INSERTION     placed during hospitalization 02/2012   COLONOSCOPY  04/01/2012   Procedure: COLONOSCOPY;  Surgeon: Juanita Craver, MD;  Location: [facility];  Service: Endoscopy;  Laterality: N/A;   ESOPHAGOGASTRODUODENOSCOPY  03/31/2012   Procedure: ESOPHAGOGASTRODUODENOSCOPY (EGD);  Surgeon: Beryle Beams, MD;  Location: [facility] ENDOSCOPY;  Service: Endoscopy;  Laterality: N/A;   EXCHANGE OF A DIALYSIS CATHETER Right 05/22/2018   Procedure: EXCHANGE OF A DIALYSIS CATHETER;  Surgeon: Angelia Mould, MD;  Location: [facility] OR;  Service: Vascular;  Laterality: Right;   IR FLUORO GUIDE CV LINE RIGHT  05/20/2018   IR THROMBECTOMY AV FISTULA W/THROMBOLYSIS INC/SHUNT/IMG LEFT Left 09/10/2018   IR US GUIDE VASC ACCESS LEFT  09/10/2018   IR US GUIDE VASC ACCESS RIGHT  05/20/2018    Family History  Problem Relation Age of Onset   Diabetes Maternal Grandmother     Social History:  reports that he has never smoked. He has never used smokeless tobacco. He reports current alcohol use. He reports that he does not use drugs.  Allergies:  Allergies  Allergen Reactions   Penicillins Rash and Other (See Comments)    Tolerating Ceftriaxone (03/12/12) Has patient had a PCN reaction causing immediate rash, facial/tongue/throat swelling, SOB or lightheadedness with hypotension: Yes Has patient had a PCN reaction causing severe rash involving mucus membranes or skin necrosis: Unk Has patient had a PCN reaction that required hospitalization: No Has patient had a PCN reaction occurring within the last 10 years: No If all of the above answers are "NO", then may proceed with Cephalosporin use.     Medications:  Current Meds  Medication Sig   aspirin 81 MG tablet  Take 81 mg by mouth at bedtime.   b complex-vitamin c-folic acid (NEPHRO-VITE) 0.8 MG TABS tablet Take 1 tablet by mouth daily.   Blood Glucose Monitoring Suppl ([place]) w/Device KIT Use to check blood sugar twice daily. (Patient taking differently: 1 each by Other route 2 (two) times daily.)   calcium elemental as carbonate (BARIATRIC TUMS ULTRA) 400 MG chewable tablet Chew 2,000 mg by mouth 3 (three) times daily.   carvedilol (COREG) 25 MG tablet Take 1.5 tablets (37.5 mg total) by mouth 2 (two) times daily with a meal.    Continuous Blood Gluc Receiver (FREESTYLE LIBRE 14 DAY READER) DEVI 1 each by Does not apply route See admin instructions. Use to monitor blood sugars.   Continuous Blood Gluc Sensor (FREESTYLE LIBRE 2 SENSOR) MISC USE AS DIRECTED (Patient taking differently: 1 each by Other route as directed.)   ethyl chloride spray Apply 1 application topically 3 (three) times a week. Apply 1 spray to skin three times weekly.   gabapentin (NEURONTIN) 100 MG capsule Take 100 mg by mouth 3 (three) times daily as needed (nerve pain).   glucose blood test strip Use Onetouch verio test strips as instructed to check blood sugar twice daily. (Patient taking differently: by Other route See admin instructions. Use Onetouch verio test strips as instructed to check blood sugar twice daily.)   insulin aspart (NOVOLOG FLEXPEN) 100 UNIT/ML FlexPen 5 to 7 units for breakfast and lunch and 10 to 14 units at dinner based on meal size and carbohydrateS (Patient taking differently: Inject 5-7 Units into the skin See admin instructions. 5 to 7 units for breakfast and lunch and 10 to 14 units at dinner based on meal size and carbohydrateS)   insulin degludec (TRESIBA FLEXTOUCH) 100 UNIT/ML FlexTouch Pen Inject 0.38 mLs (38 Units total) into the skin daily.   ivabradine (CORLANOR) 5 MG TABS tablet Take 5 mg by mouth 2 (two) times daily with a meal.   lanthanum (FOSRENOL) 1000 MG chewable tablet Chew 1 tablet (1,000 mg total) by mouth 3 (three) times daily with meals. (Patient taking differently: Chew 2,000 mg by mouth 3 (three) times daily with meals.)   OneTouch Delica Lancets 50N MISC Use onetouch delica lancets to check blood sugar twice daily. (Patient taking differently: 1 each by Other route 2 (two) times daily.)   sacubitril-valsartan (ENTRESTO) 49-51 MG Take 1 tablet by mouth 2 (two) times daily.   Vitamin D, Ergocalciferol, (DRISDOL) 1.25 MG (50000 UNIT) CAPS capsule Take 50,000 Units by mouth every 7 (seven) days.     Results  for orders placed or performed during the hospital encounter of 06/22/21 (from the past 48 hour(s))  CBC with Differential     Status: Abnormal   Collection Time: 06/22/21  8:21 PM  Result Value Ref Range   WBC 6.9 4.0 - 10.5 K/uL   RBC 3.05 (L) 4.22 - 5.81 MIL/uL   Hemoglobin 9.7 (L) 13.0 - 17.0 g/dL   HCT 30.5 (L) 39.0 - 52.0 %   MCV 100.0 80.0 - 100.0 fL   MCH 31.8 26.0 - 34.0 pg   MCHC 31.8 30.0 - 36.0 g/dL   RDW 14.6 11.5 - 15.5 %   Platelets 204 150 - 400 K/uL   nRBC 0.0 0.0 - 0.2 %   Neutrophils Relative % 69 %   Neutro Abs 4.8 1.7 - 7.7 K/uL   Lymphocytes Relative 15 %   Lymphs Abs 1.0 0.7 - 4.0 K/uL   Monocytes  Relative 8 %   Monocytes Absolute 0.5 0.1 - 1.0 K/uL   Eosinophils Relative 7 %   Eosinophils Absolute 0.5 0.0 - 0.5 K/uL   Basophils Relative 1 %   Basophils Absolute 0.1 0.0 - 0.1 K/uL   Immature Granulocytes 0 %   Abs Immature Granulocytes 0.03 0.00 - 0.07 K/uL    Comment: Performed at [facility], [address] [place], [place] [address]  Comprehensive metabolic panel     Status: Abnormal   Collection Time: 06/22/21  8:21 PM  Result Value Ref Range   Sodium 137 135 - 145 mmol/L   Potassium 4.4 3.5 - 5.1 mmol/L   Chloride 97 (L) 98 - 111 mmol/L   CO2 30 22 - 32 mmol/L   Glucose, Bld 85 70 - 99 mg/dL    Comment: Glucose reference range applies only to samples taken after fasting for at least 8 hours.   BUN 44 (H) 6 - 20 mg/dL   Creatinine, Ser 6.75 (H) 0.61 - 1.24 mg/dL   Calcium 8.2 (L) 8.9 - 10.3 mg/dL   Total Protein 7.4 6.5 - 8.1 g/dL   Albumin 3.6 3.5 - 5.0 g/dL   AST 431 (H) 15 - 41 U/L   ALT 547 (H) 0 - 44 U/L   Alkaline Phosphatase 229 (H) 38 - 126 U/L   Total Bilirubin 5.0 (H) 0.3 - 1.2 mg/dL   GFR, Estimated 9 (L) >60 mL/min    Comment: (NOTE) Calculated using the CKD-EPI Creatinine Equation (2021)    Anion gap 10 5 - 15    Comment: Performed at [facility], [address] [place], [place] [address]  Lipase,  blood     Status: None   Collection Time: 06/22/21  8:21 PM  Result Value Ref Range   Lipase 37 11 - 51 U/L    Comment: Performed at [facility], [address] [place], [place] [address]  Resp Panel by RT-PCR (Flu A&B, Covid) Nasopharyngeal Swab     Status: None   Collection Time: 06/22/21 11:41 PM   Specimen: Nasopharyngeal Swab; Nasopharyngeal(NP) swabs in vial transport medium  Result Value Ref Range   SARS Coronavirus 2 by RT PCR NEGATIVE NEGATIVE    Comment: (NOTE) SARS-CoV-2 target nucleic acids are NOT DETECTED.  The SARS-CoV-2 RNA is generally detectable in upper respiratory specimens during the acute phase of infection. The lowest concentration of SARS-CoV-2 viral copies this assay can detect is 138 copies/mL. A negative result does not preclude SARS-Cov-2 infection and should not be used as the sole basis for treatment or other patient management decisions. A negative result may occur with  improper specimen collection/handling, submission of specimen other than nasopharyngeal swab, presence of viral mutation(s) within the areas targeted by this assay, and inadequate number of viral copies(<138 copies/mL). A negative result must be combined with clinical observations, patient history, and epidemiological information. The expected result is Negative.  Fact Sheet for Patients:  EntrepreneurPulse.com.au  Fact Sheet for Healthcare Providers:  IncredibleEmployment.be  This test is no t yet approved or cleared by the [place] FDA and  has been authorized for detection and/or diagnosis of SARS-CoV-2 by FDA under an Emergency Use Authorization (EUA). This EUA will remain  in effect (meaning this test can be used) for the duration of the COVID-19 declaration under Section 564(b)(1) of the Act, 21 U.S.C.section 360bbb-3(b)(1), unless the authorization is terminated  or revoked sooner.  Influenza A by PCR NEGATIVE  NEGATIVE   Influenza B by PCR NEGATIVE NEGATIVE    Comment: (NOTE) The Xpert Xpress SARS-CoV-2/FLU/RSV plus assay is intended as an aid in the diagnosis of influenza from Nasopharyngeal swab specimens and should not be used as a sole basis for treatment. Nasal washings and aspirates are unacceptable for Xpert Xpress SARS-CoV-2/FLU/RSV testing.  Fact Sheet for Patients: EntrepreneurPulse.com.au  Fact Sheet for Healthcare Providers: IncredibleEmployment.be  This test is not yet approved or cleared by the [place] FDA and has been authorized for detection and/or diagnosis of SARS-CoV-2 by FDA under an Emergency Use Authorization (EUA). This EUA will remain in effect (meaning this test can be used) for the duration of the COVID-19 declaration under Section 564(b)(1) of the Act, 21 U.S.C. section 360bbb-3(b)(1), unless the authorization is terminated or revoked.  Performed at [facility], [address], [place], [place] [address]   Brain natriuretic peptide     Status: Abnormal   Collection Time: 06/23/21  8:24 AM  Result Value Ref Range   B Natriuretic Peptide 689.0 (H) 0.0 - 100.0 pg/mL    Comment: Performed at [place] [facility] [address], [place], [place] [address]  CK     Status: None   Collection Time: 06/23/21  8:24 AM  Result Value Ref Range   Total CK 105 49 - 397 U/L    Comment: Performed at [facility], [place] [address] [place], [place] [address]  Hepatic function panel     Status: Abnormal   Collection Time: 06/23/21  8:24 AM  Result Value Ref Range   Total Protein 6.9 6.5 - 8.1 g/dL   Albumin 3.2 (L) 3.5 - 5.0 g/dL   AST 332 (H) 15 - 41 U/L   ALT 510 (H) 0 - 44 U/L   Alkaline Phosphatase 209 (H) 38 - 126 U/L   Total Bilirubin 4.0 (H) 0.3 - 1.2 mg/dL   Bilirubin, Direct 2.4 (H) 0.0 - 0.2 mg/dL   Indirect Bilirubin 1.6 (H) 0.3 - 0.9 mg/dL    Comment: Performed at [facility] [address], [place], [place] [address]  Gamma GT     Status: Abnormal   Collection Time: 06/23/21  8:24 AM  Result Value Ref Range   GGT 110 (H) 7 - 50 U/L    Comment: Performed at [facility], [place] [address], [place], [place] [address]  Protime-INR     Status: None   Collection Time: 06/23/21  8:24 AM  Result Value Ref Range   Prothrombin Time 13.8 11.4 - 15.2 seconds   INR 1.1 0.8 - 1.2    Comment: (NOTE) INR goal varies based on device and disease states. Performed at [facility], [place] [address] [place], [place] [address]   APTT     Status: None   Collection Time: 06/23/21  8:24 AM  Result Value Ref Range   aPTT 29 24 - 36 seconds    Comment: Performed at [facility] [address], [place], [place] [address]  Folate     Status: None   Collection Time: 06/23/21  8:24 AM  Result Value Ref Range   Folate 43.6 >5.9 ng/mL    Comment: Performed at [facility] [address] [place], [place] [address]  Vitamin B12     Status: Abnormal   Collection Time: 06/23/21  8:24 AM  Result Value Ref Range  Vitamin B-12 1,589 (H) 180 - 914 pg/mL    Comment: (NOTE) This assay is not validated for testing neonatal or myeloproliferative syndrome specimens for Vitamin B12 levels. Performed at [facility], [place] [address], [place], [place] [address]   Iron and TIBC     Status: Abnormal   Collection Time: 06/23/21  8:24 AM  Result Value Ref Range   Iron 38 (L) 45 - 182 ug/dL   TIBC 200 (L) 250 - 450 ug/dL   Saturation Ratios 19 17.9 - 39.5 %   UIBC 162 ug/dL    Comment: Performed at [facility], [place] [address], [place], [place] [address]  Hemoglobin A1c     Status: Abnormal   Collection Time: 06/23/21  8:24 AM  Result Value Ref Range   Hgb A1c MFr Bld 7.7 (H) 4.8 - 5.6 %    Comment: (NOTE) Pre diabetes:          5.7%-6.4%  Diabetes:              >6.4%  Glycemic control for   <7.0% adults with diabetes    Mean Plasma Glucose 174.29 mg/dL    Comment:  Performed at [facility] [address] [place], [place] [address]  Glucose, capillary     Status: Abnormal   Collection Time: 06/23/21  8:29 AM  Result Value Ref Range   Glucose-Capillary 114 (H) 70 - 99 mg/dL    Comment: Glucose reference range applies only to samples taken after fasting for at least 8 hours.  Glucose, capillary     Status: Abnormal   Collection Time: 06/23/21 11:46 AM  Result Value Ref Range   Glucose-Capillary 102 (H) 70 - 99 mg/dL    Comment: Glucose reference range applies only to samples taken after fasting for at least 8 hours.    MR ABDOMEN MRCP W WO CONTAST  Result Date: 06/23/2021 CLINICAL DATA:  Jaundice. EXAM: MRI ABDOMEN WITHOUT AND WITH CONTRAST (INCLUDING MRCP) TECHNIQUE: Multiplanar multisequence MR imaging of the abdomen was performed both before and after the administration of intravenous contrast. Heavily T2-weighted images of the biliary and pancreatic ducts were obtained, and three-dimensional MRCP images were rendered by post processing. CONTRAST:  63m GADAVIST GADOBUTROL 1 MMOL/ML IV SOLN COMPARISON:  Noncontrast CT on 02/06/2019 FINDINGS: Lower chest: No acute findings. Hepatobiliary: No hepatic masses identified. Diffusely decreased T2 signal intensity of hepatic parenchyma is consistent with increased iron deposition. Numerous tiny gallstones are seen nearly filling the gallbladder. Diffuse gallbladder wall thickening is seen as well as mild pericholecystic edema, consistent with acute cholecystitis. No evidence of biliary ductal dilatation with common bile duct measuring 5 mm in diameter. However, 2 tiny calculi are seen in the distal common bile duct, largest measuring 5 mm. Pancreas: No mass or inflammatory changes. No evidence of pancreatic ductal dilatation or pancreas divisum. Spleen: Within normal limits in size. No masses identified. Diffuse T2 signal intensity of splenic parenchyma is consistent with increased iron deposition.  Adrenals/Urinary Tract: No masses identified. A few tiny sub-cm renal cysts are seen bilaterally. No evidence of hydronephrosis. Stomach/Bowel: Visualized portion unremarkable. Vascular/Lymphatic: No pathologically enlarged lymph nodes identified. No acute vascular findings. Other:  None. Musculoskeletal: No suspicious bone lesions identified. Diffuse T2 hypointensity of the marrow signal seen, consistent with increased iron deposition. IMPRESSION: Findings consistent with acute cholecystitis. Two tiny distal common bile duct calculi, largest measuring 5 mm. No evidence of biliary ductal dilatation. Increased iron deposition within the liver,  spleen, and bone marrow, consistent with hemosiderosis. Electronically Signed   By: Marlaine Hind M.D.   On: 06/23/2021 10:52   US Abdomen Limited RUQ (LIVER/GB)  Result Date: 06/22/2021 CLINICAL DATA:  Abdominal pain, nausea, abnormal labs. EXAM: ULTRASOUND ABDOMEN LIMITED RIGHT UPPER QUADRANT COMPARISON:  CT 01/22/2021 FINDINGS: Gallbladder: Cholelithiasis with multiple small stones filling the gallbladder. Gallbladder wall thickening with wall thickness measuring 4.1 mm. Mild edema. Murphy's sign is negative. Common bile duct: Diameter: 6 mm, normal Liver: No focal lesion identified. Within normal limits in parenchymal echogenicity. Portal vein is patent on color Doppler imaging with normal direction of blood flow towards the liver. Other: None. IMPRESSION: Cholelithiasis with gallbladder wall thickening and edema. This may indicate cholecystitis in the appropriate clinical setting although the Murphy's sign is negative. Electronically Signed   By: Lucienne Capers M.D.   On: 06/22/2021 22:01    Review of Systems  Constitutional: Negative.   HENT: Negative.    Eyes: Negative.   Respiratory: Negative.    Cardiovascular: Negative.   Gastrointestinal:  Positive for abdominal pain, nausea and vomiting.  Endocrine: Negative.   Genitourinary:        ESRD   Musculoskeletal: Negative.   Skin: Negative.   Allergic/Immunologic: Negative.   Neurological: Negative.   Hematological:  Bruises/bleeds easily.  Psychiatric/Behavioral: Negative.    All other systems reviewed and are negative. Blood pressure 128/72, pulse 70, temperature 98.4 F (36.9 C), temperature source Oral, resp. rate 20, height _0  (1.981 m), weight 88.5 kg, SpO2 98 %. Physical Exam Vitals reviewed.  Constitutional:      General: He is not in acute distress.    Appearance: He is well-developed and normal weight. He is not ill-appearing, toxic-appearing or diaphoretic.  HENT:     Head: Normocephalic.     Mouth/Throat:     Mouth: Mucous membranes are moist.  Eyes:     General: Scleral icterus (faint) present.     Pupils: Pupils are equal, round, and reactive to light.  Cardiovascular:     Rate and Rhythm: Normal rate and regular rhythm.     Comments: Left upper arm fistula Pulmonary:     Effort: Pulmonary effort is normal.     Breath sounds: No stridor.  Abdominal:     General: Abdomen is flat and scaphoid. There is no distension or abdominal bruit.     Palpations: Abdomen is soft. There is no fluid wave, hepatomegaly or splenomegaly.     Tenderness: There is no abdominal tenderness (mild).     Hernia: No hernia is present.  Skin:    General: Skin is warm and dry.     Capillary Refill: Capillary refill takes 2 to 3 seconds.  Neurological:     General: No focal deficit present.     Mental Status: He is alert and oriented to person, place, and time.  Psychiatric:        Mood and Affect: Mood normal. Mood is not anxious or depressed.        Behavior: Behavior normal.    Assessment/Plan: Choledocholithiasis ESRD DM CHF HTN  GI to evaluate for consideration of ERCP Will need lap chole.  Nephrology to see also to time HD.  Per medicine, no clinical signs of volume overload today.   Echo ordered  Surgery to follow to determine appropriate timing of lap  chole.     Milus Height, MD FACS Surgical Oncology, General Surgery, Trauma and [facility], [place] for  weekday/non holidays Check [email or web address] for coverage night/weekend/holidays  Do not use SecureChat as it is not reliable for timely patient care.

## 2021-06-23 NOTE — Progress Notes (Signed)
[facility] PLAN OF CARE NOTE Patient: Keymarion Bearman Ibarra QTM:[id number]   PCP: [name], PA-C DOB: 1966-01-07   DOA: 06/22/2021   DOS: 06/23/2021    Patient was admitted by my colleague earlier on 06/23/2021. I have reviewed the H&P as well as assessment and plan and agree with the same. Important changes in the plan are listed below.  Plan of care: Principal Problem:   Nausea vomiting and diarrhea Active Problems:   Essential hypertension   Chronic diastolic CHF (congestive heart failure) (HCC)   Type 2 diabetes mellitus with diabetic polyneuropathy, with long-term current use of insulin (HCC)   Anemia of chronic disease   ESRD on dialysis ([place])   Mixed diabetic hyperlipidemia associated with type 2 diabetes mellitus (HCC)   Abnormal liver enzymes   Diabetic polyneuropathy associated with type 2 diabetes mellitus [facility]) Nephrology consulted. GI consulted. General surgery consulted.   Author: Berle Mull, MD [facility] 06/23/2021 7:45 PM   If 7PM-7AM, please contact night-coverage at [email or web address]

## 2021-06-23 NOTE — Procedures (Signed)
   I was present at this dialysis session, have reviewed the session itself and made  appropriate changes Kelly Splinter MD [place] pager [phone]   06/23/2021, 4:55 PM

## 2021-06-23 NOTE — Consult Note (Signed)
[facility] Renal Consultation Note    Indication for Consultation:  Management of ESRD/hemodialysis; anemia, hypertension/volume and secondary hyperparathyroidism  AJO:INOMVEHM, Gracelyn Nurse, PA-C  HPI: [name] [name] is a 55 y.o. male with ESRD on HD TTS at [facility].  Past medical history significant for DM, HTN, HLD, CHF with EF now improved to 45%, PVD, anemia of CKD and secondary hyperparathyroidism.  Of note patient is compliant with prescribed dialysis regimen and intradialytic weight gains are usually within goal.   Patient seen and examined at bedside. Presented to the ED [facility] after receiving a call from his PCP about elevated labs with recommendation to be seen emergently.  Reports he went to his PCP Friday due to nausea and vomiting x3 on Thursday.  First episode of vomiting occurred during dialysis, then once on the way home and again at home.  He had a similar episode a few weeks ago while in [place] but believed it was due to overheating/dehydration.  Denies abdominal pain, fever, chills, CP, SOB, weakness, dizziness and fatigue.  Reports dialysis has been going well except for this episode.  Patient transferred to [facility] overnight.  Pertinent findings during work up include hypertension, alk phos 209, AST 332, ALT 510, total bilirubin 4.0, GGT 110, Abd [place] with Cholelithiasis with gallbladder wall thickening and edema and MRI MRCP findings consistent with acute cholecystitis as well as hemosiderosis.  Admitted for further evaluation and management.   Past Medical History:  Diagnosis Date   Bacteremia    Diabetes mellitus    Dialysis patient [facility])    Hypertension    Pneumonia 02/2012   Strep pneumoniae bilateral pneumonia complicated by bacteremia   Renal disorder    End stage, not on dialysis   Past Surgical History:  Procedure Laterality Date   AMPUTATION TOE     AV FISTULA PLACEMENT Left 05/22/2018   Procedure: Creation of BrachiaCephalic Fistula Left Arm;   Surgeon: Angelia Mould, MD;  Location: [place];  Service: Vascular;  Laterality: Left;   [place] Left 11/12/2018   Procedure: BASILIC VEIN TRANSPOSITION LEFT ARM;  Surgeon: Serafina [facility], MD;  Location: [place];  Service: Vascular;  Laterality: Left;   CHEST TUBE INSERTION     placed during hospitalization 02/2012   COLONOSCOPY  04/01/2012   Procedure: COLONOSCOPY;  Surgeon: Juanita Craver, MD;  Location: [place];  Service: Endoscopy;  Laterality: N/A;   ESOPHAGOGASTRODUODENOSCOPY  03/31/2012   Procedure: ESOPHAGOGASTRODUODENOSCOPY (EGD);  Surgeon: Beryle Beams, MD;  Location: [facility] ENDOSCOPY;  Service: Endoscopy;  Laterality: N/A;   EXCHANGE OF A DIALYSIS CATHETER Right 05/22/2018   Procedure: EXCHANGE OF A DIALYSIS CATHETER;  Surgeon: Angelia Mould, MD;  Location: [facility] OR;  Service: Vascular;  Laterality: Right;   IR FLUORO GUIDE CV LINE RIGHT  05/20/2018   IR THROMBECTOMY AV FISTULA W/THROMBOLYSIS INC/SHUNT/IMG LEFT Left 09/10/2018   IR US GUIDE VASC ACCESS LEFT  09/10/2018   IR US GUIDE VASC ACCESS RIGHT  05/20/2018   Family History  Problem Relation Age of Onset   Diabetes Maternal Grandmother    Social History:  reports that he has never smoked. He has never used smokeless tobacco. He reports current alcohol use. He reports that he does not use drugs. Allergies  Allergen Reactions   Penicillins Rash and Other (See Comments)    Tolerating Ceftriaxone (03/12/12) Has patient had a PCN reaction causing immediate rash, facial/tongue/throat swelling, SOB or lightheadedness with hypotension: Yes Has patient had a PCN  reaction causing severe rash involving mucus membranes or skin necrosis: Unk Has patient had a PCN reaction that required hospitalization: No Has patient had a PCN reaction occurring within the last 10 years: No If all of the above answers are "NO", then may proceed with Cephalosporin use.    Prior to Admission medications   Medication Sig Start  Date End Date Taking? Authorizing Provider  aspirin 81 MG tablet Take 81 mg by mouth at bedtime.   Yes [provider]  b complex-vitamin c-folic acid (NEPHRO-VITE) 0.8 MG TABS tablet Take 1 tablet by mouth daily.   Yes [provider]  Blood Glucose Monitoring Suppl ([place]) w/Device KIT Use to check blood sugar twice daily. Patient taking differently: 1 each by Other route 2 (two) times daily. 04/21/20  Yes Elayne Snare, MD  calcium elemental as carbonate (BARIATRIC TUMS ULTRA) 400 MG chewable tablet Chew 2,000 mg by mouth 3 (three) times daily.   Yes [provider]  carvedilol (COREG) 25 MG tablet Take 1.5 tablets (37.5 mg total) by mouth 2 (two) times daily with a meal. 03/25/17  Yes Troy Sine, MD  Continuous Blood Gluc Receiver (FREESTYLE LIBRE 14 DAY READER) DEVI 1 each by Does not apply route See admin instructions. Use to monitor blood sugars. 03/13/20  Yes Elayne Snare, MD  Continuous Blood Gluc Sensor (FREESTYLE LIBRE 2 SENSOR) MISC USE AS DIRECTED Patient taking differently: 1 each by Other route as directed. 01/30/20  Yes Elayne Snare, MD  ethyl chloride spray Apply 1 application topically 3 (three) times a week. Apply 1 spray to skin three times weekly.   Yes [provider]  gabapentin (NEURONTIN) 100 MG capsule Take 100 mg by mouth 3 (three) times daily as needed (nerve pain).   Yes [provider]  glucose blood test strip Use Onetouch verio test strips as instructed to check blood sugar twice daily. Patient taking differently: by Other route See admin instructions. Use Onetouch verio test strips as instructed to check blood sugar twice daily. 04/21/20  Yes Elayne Snare, MD  insulin aspart (NOVOLOG FLEXPEN) 100 UNIT/ML FlexPen 5 to 7 units for breakfast and lunch and 10 to 14 units at dinner based on meal size and carbohydrateS Patient taking differently: Inject 5-7 Units into the skin See admin instructions. 5 to 7 units for  breakfast and lunch and 10 to 14 units at dinner based on meal size and carbohydrateS 12/10/19  Yes Elayne Snare, MD  insulin degludec (TRESIBA FLEXTOUCH) 100 UNIT/ML FlexTouch Pen Inject 0.38 mLs (38 Units total) into the skin daily. 04/12/20  Yes Elayne Snare, MD  ivabradine (CORLANOR) 5 MG TABS tablet Take 5 mg by mouth 2 (two) times daily with a meal.   Yes [provider]  lanthanum (FOSRENOL) 1000 MG chewable tablet Chew 1 tablet (1,000 mg total) by mouth 3 (three) times daily with meals. Patient taking differently: Chew 2,000 mg by mouth 3 (three) times daily with meals. 05/25/18  Yes Domenic Polite, MD  OneTouch Delica Lancets 25W MISC Use onetouch delica lancets to check blood sugar twice daily. Patient taking differently: 1 each by Other route 2 (two) times daily. 04/21/20  Yes Elayne Snare, MD  sacubitril-valsartan (ENTRESTO) 49-51 MG Take 1 tablet by mouth 2 (two) times daily.   Yes [provider]  Vitamin D, Ergocalciferol, (DRISDOL) 1.25 MG (50000 UNIT) CAPS capsule Take 50,000 Units by mouth every 7 (seven) days.   Yes [provider]   Current  Facility-Administered Medications  Medication Dose Route Frequency Provider Last Rate Last Admin   acetaminophen (TYLENOL) tablet 650 mg  650 mg Oral Q6H PRN Shalhoub, Sherryll Burger, MD       Or   acetaminophen (TYLENOL) suppository 650 mg  650 mg Rectal Q6H PRN Shalhoub, Sherryll Burger, MD       aspirin EC tablet 81 mg  81 mg Oral Daily Vernelle Emerald, MD   81 mg at 06/23/21 0919   calcium carbonate (TUMS - dosed in mg elemental calcium) chewable tablet 2,000 mg  2,000 mg Oral TID WC Vernelle Emerald, MD   2,000 mg at 06/23/21 1217   carvedilol (COREG) tablet 37.5 mg  37.5 mg Oral BID WC Vernelle Emerald, MD   37.5 mg at 06/23/21 0906   cefTRIAXone (ROCEPHIN) 2 g in sodium chloride 0.9 % 100 mL IVPB  2 g Intravenous Q24H Lavina Hamman, MD       Chlorhexidine Gluconate Cloth 2 % PADS 6 each  6 each Topical Q0600  Mercia Dowe, Ria Comment, PA   6 each at 06/23/21 1106   gabapentin (NEURONTIN) capsule 100 mg  100 mg Oral TID PRN Vernelle Emerald, MD       HYDROmorphone (DILAUDID) injection 0.5 mg  0.5 mg Intravenous Q4H PRN Lavina Hamman, MD       insulin aspart (novoLOG) injection 0-9 Units  0-9 Units Subcutaneous Q6H Lavina Hamman, MD       insulin glargine (LANTUS) injection 12 Units  12 Units Subcutaneous QHS Lavina Hamman, MD       ivabradine [facility]) tablet 5 mg  5 mg Oral BID WC Vernelle Emerald, MD   5 mg at 06/23/21 6073   lanthanum (FOSRENOL) chewable tablet 1,000 mg  1,000 mg Oral TID WC Vernelle Emerald, MD   1,000 mg at 06/23/21 7106   sacubitril-valsartan (ENTRESTO) 97-103 mg per tablet  1 tablet Oral BID Vernelle Emerald, MD   1 tablet at 06/23/21 0919   Labs: Basic Metabolic Panel: Recent Labs  Lab 06/22/21 2021  NA 137  K 4.4  CL 97*  CO2 30  GLUCOSE 85  BUN 44*  CREATININE 6.75*  CALCIUM 8.2*   Liver Function Tests: Recent Labs  Lab 06/22/21 2021 06/23/21 0824  AST 431* 332*  ALT 547* 510*  ALKPHOS 229* 209*  BILITOT 5.0* 4.0*  PROT 7.4 6.9  ALBUMIN 3.6 3.2*   Recent Labs  Lab 06/22/21 2021  LIPASE 37   No results for input(s): AMMONIA in the last 168 hours. CBC: Recent Labs  Lab 06/22/21 2021  WBC 6.9  NEUTROABS 4.8  HGB 9.7*  HCT 30.5*  MCV 100.0  PLT 204   Cardiac Enzymes: Recent Labs  Lab 06/23/21 0824  CKTOTAL 105   CBG: Recent Labs  Lab 06/23/21 0829 06/23/21 1146  GLUCAP 114* 102*   Iron Studies:  Recent Labs    06/23/21 0824  IRON 38*  TIBC 200*   Studies/Results: MR ABDOMEN MRCP W WO CONTAST  Result Date: 06/23/2021 CLINICAL DATA:  Jaundice. EXAM: MRI ABDOMEN WITHOUT AND WITH CONTRAST (INCLUDING MRCP) TECHNIQUE: Multiplanar multisequence MR imaging of the abdomen was performed both before and after the administration of intravenous contrast. Heavily T2-weighted images of the biliary and pancreatic ducts were obtained,  and three-dimensional MRCP images were rendered by post processing. CONTRAST:  90m GADAVIST GADOBUTROL 1 MMOL/ML IV SOLN COMPARISON:  Noncontrast CT on 02/06/2019 FINDINGS: Lower chest: No acute findings.  Hepatobiliary: No hepatic masses identified. Diffusely decreased T2 signal intensity of hepatic parenchyma is consistent with increased iron deposition. Numerous tiny gallstones are seen nearly filling the gallbladder. Diffuse gallbladder wall thickening is seen as well as mild pericholecystic edema, consistent with acute cholecystitis. No evidence of biliary ductal dilatation with common bile duct measuring 5 mm in diameter. However, 2 tiny calculi are seen in the distal common bile duct, largest measuring 5 mm. Pancreas: No mass or inflammatory changes. No evidence of pancreatic ductal dilatation or pancreas divisum. Spleen: Within normal limits in size. No masses identified. Diffuse T2 signal intensity of splenic parenchyma is consistent with increased iron deposition. Adrenals/Urinary Tract: No masses identified. A few tiny sub-cm renal cysts are seen bilaterally. No evidence of hydronephrosis. Stomach/Bowel: Visualized portion unremarkable. Vascular/Lymphatic: No pathologically enlarged lymph nodes identified. No acute vascular findings. Other:  None. Musculoskeletal: No suspicious bone lesions identified. Diffuse T2 hypointensity of the marrow signal seen, consistent with increased iron deposition. IMPRESSION: Findings consistent with acute cholecystitis. Two tiny distal common bile duct calculi, largest measuring 5 mm. No evidence of biliary ductal dilatation. Increased iron deposition within the liver, spleen, and bone marrow, consistent with hemosiderosis. Electronically Signed   By: Marlaine Hind M.D.   On: 06/23/2021 10:52   US Abdomen Limited RUQ (LIVER/GB)  Result Date: 06/22/2021 CLINICAL DATA:  Abdominal pain, nausea, abnormal labs. EXAM: ULTRASOUND ABDOMEN LIMITED RIGHT UPPER QUADRANT COMPARISON:   CT 01/22/2021 FINDINGS: Gallbladder: Cholelithiasis with multiple small stones filling the gallbladder. Gallbladder wall thickening with wall thickness measuring 4.1 mm. Mild edema. Murphy's sign is negative. Common bile duct: Diameter: 6 mm, normal Liver: No focal lesion identified. Within normal limits in parenchymal echogenicity. Portal vein is patent on color Doppler imaging with normal direction of blood flow towards the liver. Other: None. IMPRESSION: Cholelithiasis with gallbladder wall thickening and edema. This may indicate cholecystitis in the appropriate clinical setting although the Murphy's sign is negative. Electronically Signed   By: Lucienne Capers M.D.   On: 06/22/2021 22:01    ROS: All others negative except those listed in HPI.  Physical Exam: Vitals:   06/23/21 0100 06/23/21 0415 06/23/21 0740 06/23/21 0900  BP: (!) 152/79 (!) 155/73 (!) 146/72 128/72  Pulse: 66 81 70   Resp: 18 16 20    Temp:  98.3 F (36.8 C) 98.4 F (36.9 C)   TempSrc:  Oral Oral   SpO2: 96% 98% 98%   Weight:      Height:         General: WDWN male in NAD Head: NCAT sclera not icteric MMM Neck: Supple. No lymphadenopathy Lungs: CTA bilaterally. No wheeze, rales or rhonchi. Breathing is unlabored. Heart: RRR. No murmur, rubs or gallops.  Abdomen: soft, nontender, +BS, no guarding, no rebound tenderness Lower extremities:no edema bilaterally  Neuro: AAOx3. Moves all extremities spontaneously. Psych:  Responds to questions appropriately with a normal affect. Dialysis Access: LU AVF +b/t  Dialysis Orders:  TTS - [facility]  4hrs 59mn, BFR 400, DFR 500,  EDW 88kg, 2K/ 2.5Ca  Access: LU AVF  Heparin 4000 unit bolus Mircera 50 mcg q2wks - last 6/23 Calcitriol 0.718m PO qHD   Assessment/Plan:  Acute cholecystitis - noted on MRI. LFTs elevated. Surgery consulted.  Plan for lap chole at some point.  GI to see for possible ERCP.   ESRD -  on HD TTS.  Orders written for HD today around scheduled  procedures/imaging. Volume and renal labs stable if needs to be rescheduled for  tomorrow. Hx hyperkalemia, monitor labs on [place] as outpatient on non dialysis days.   Hypertension/volume  - BP mostly well controlled. Continue home meds.  Does not appear volume overloaded.  Plan for UF to dry.   Anemia of CKD - Hgb 9.7. tsat 19%, no iron with hemosiderosis noted on MRI. ESA due 7/7. Follow trends.  Secondary Hyperparathyroidism -  Ca in goal. Will check phos. Continue binders.   Nutrition - Renal diet w/fluid restrictions.  CHF - last EF improved to 45% DMT2 - per primary.   Jen Mow, PA-C [facility] 06/23/2021, 1:17 PM

## 2021-06-23 NOTE — Consult Note (Addendum)
Covering for Dr. Collene Mares  Consultation  Referring Provider: Dr. Posey Pronto    Primary Care Physician:  [name], PA-C Primary Gastroenterologist: Dr. Collene Mares      Reason for Consultation: CBD stone             HPI:   [name] [name] is a 55 y.o. male with a past medical history of ESRD on HD Tuesday Thursday and Saturday, CHF with EF improved to 45% recently, PVD, anemia of CKD and secondary hyperparathyroidism, who presented to [place] after receiving a call from his PCP about elevated labs.  We are consulted now regards to an MRCP with choledocholithiasis.    According to previous physicians notes patient was seen 06/22/2021 by his PCP for nausea and vomiting on 3 occasions a day before, his first episode occurred during dialysis and then once on the way home and again at home.  Apparently had a similar episode a few weeks ago while in [place] but thought it was due to overheating/dehydration.    Today, patient is found in dialysis.  He tells me that about 3 weeks ago he was in [place] and had a similar occasion of nausea and some vomiting but thought this was just due to dehydration.  Then this occurred again yesterday after having dialysis, describes 3 episodes of vomiting and nausea.  No abdominal pain.  Tells me he has never had issues with his gallbladder before.  No other complaints or concerns.    Denies fever, chills, weight loss, change in bowel habits or symptoms that awaken him from sleep.  ER course: Alk phos 209, AST 332, ALT 510, total bilirubin 4.0, GGT 110, abdominal ultrasound with cholelithiasis with gallbladder wall thickening and edema and MRCP findings consistent with acute cholecystitis as well as choledocholithiasis, though gallstones are small and there is no biliary ductal dilation.  Past Medical History:  Diagnosis Date   Bacteremia    Diabetes mellitus    Dialysis patient [facility])    Hypertension    Pneumonia 02/2012   Strep pneumoniae bilateral  pneumonia complicated by bacteremia   Renal disorder    End stage, not on dialysis    Past Surgical History:  Procedure Laterality Date   AMPUTATION TOE     AV FISTULA PLACEMENT Left 05/22/2018   Procedure: Creation of BrachiaCephalic Fistula Left Arm;  Surgeon: Angelia Mould, MD;  Location: [place];  Service: Vascular;  Laterality: Left;   [place] Left 11/12/2018   Procedure: BASILIC VEIN TRANSPOSITION LEFT ARM;  Surgeon: Serafina Mitchell, MD;  Location: [place];  Service: Vascular;  Laterality: Left;   CHEST TUBE INSERTION     placed during hospitalization 02/2012   COLONOSCOPY  04/01/2012   Procedure: COLONOSCOPY;  Surgeon: Juanita Craver, MD;  Location: [place];  Service: Endoscopy;  Laterality: N/A;   ESOPHAGOGASTRODUODENOSCOPY  03/31/2012   Procedure: ESOPHAGOGASTRODUODENOSCOPY (EGD);  Surgeon: Beryle Beams, MD;  Location: [facility] ENDOSCOPY;  Service: Endoscopy;  Laterality: N/A;   EXCHANGE OF A DIALYSIS CATHETER Right 05/22/2018   Procedure: EXCHANGE OF A DIALYSIS CATHETER;  Surgeon: Angelia Mould, MD;  Location[facility] OR;  Service: Vascular;  Laterality: Right;   IR FLUORO GUIDE CV LINE RIGHT  05/20/2018   IR THROMBECTOMY AV FISTULA W/THROMBOLYSIS INC/SHUNT/IMG LEFT Left 09/10/2018   IR US GUIDE VASC ACCESS LEFT  09/10/2018   IR US GUIDE VASC ACCESS RIGHT  05/20/2018    Family History  Problem Relation Age of Onset  Diabetes Maternal Grandmother     Social History   Tobacco Use   Smoking status: Never   Smokeless tobacco: Never  Substance Use Topics   Alcohol use: Yes    Comment: rare    Drug use: No    Prior to Admission medications   Medication Sig Start Date End Date Taking? Authorizing Provider  aspirin 81 MG tablet Take 81 mg by mouth at bedtime.   Yes [provider]  b complex-vitamin c-folic acid (NEPHRO-VITE) 0.8 MG TABS tablet Take 1 tablet by mouth daily.   Yes [provider]  Blood Glucose Monitoring Suppl  ([place]) w/Device KIT Use to check blood sugar twice daily. Patient taking differently: 1 each by Other route 2 (two) times daily. 04/21/20  Yes Elayne Snare, MD  calcium elemental as carbonate (BARIATRIC TUMS ULTRA) 400 MG chewable tablet Chew 2,000 mg by mouth 3 (three) times daily.   Yes [provider]  carvedilol (COREG) 25 MG tablet Take 1.5 tablets (37.5 mg total) by mouth 2 (two) times daily with a meal. 03/25/17  Yes Troy Sine, MD  Continuous Blood Gluc Receiver (FREESTYLE LIBRE 14 DAY READER) DEVI 1 each by Does not apply route See admin instructions. Use to monitor blood sugars. 03/13/20  Yes Elayne Snare, MD  Continuous Blood Gluc Sensor (FREESTYLE LIBRE 2 SENSOR) MISC USE AS DIRECTED Patient taking differently: 1 each by Other route as directed. 01/30/20  Yes Elayne Snare, MD  ethyl chloride spray Apply 1 application topically 3 (three) times a week. Apply 1 spray to skin three times weekly.   Yes [provider]  gabapentin (NEURONTIN) 100 MG capsule Take 100 mg by mouth 3 (three) times daily as needed (nerve pain).   Yes [provider]  glucose blood test strip Use Onetouch verio test strips as instructed to check blood sugar twice daily. Patient taking differently: by Other route See admin instructions. Use Onetouch verio test strips as instructed to check blood sugar twice daily. 04/21/20  Yes Elayne Snare, MD  insulin aspart (NOVOLOG FLEXPEN) 100 UNIT/ML FlexPen 5 to 7 units for breakfast and lunch and 10 to 14 units at dinner based on meal size and carbohydrateS Patient taking differently: Inject 5-7 Units into the skin See admin instructions. 5 to 7 units for breakfast and lunch and 10 to 14 units at dinner based on meal size and carbohydrateS 12/10/19  Yes Elayne Snare, MD  insulin degludec (TRESIBA FLEXTOUCH) 100 UNIT/ML FlexTouch Pen Inject 0.38 mLs (38 Units total) into the skin daily. 04/12/20  Yes Elayne Snare, MD  ivabradine (CORLANOR)  5 MG TABS tablet Take 5 mg by mouth 2 (two) times daily with a meal.   Yes [provider]  lanthanum (FOSRENOL) 1000 MG chewable tablet Chew 1 tablet (1,000 mg total) by mouth 3 (three) times daily with meals. Patient taking differently: Chew 2,000 mg by mouth 3 (three) times daily with meals. 05/25/18  Yes Domenic Polite, MD  OneTouch Delica Lancets 19Q MISC Use onetouch delica lancets to check blood sugar twice daily. Patient taking differently: 1 each by Other route 2 (two) times daily. 04/21/20  Yes Elayne Snare, MD  sacubitril-valsartan (ENTRESTO) 49-51 MG Take 1 tablet by mouth 2 (two) times daily.   Yes [provider]  Vitamin D, Ergocalciferol, (DRISDOL) 1.25 MG (50000 UNIT) CAPS capsule Take 50,000 Units by mouth every 7 (seven) days.   Yes [provider]    Current Facility-Administered Medications  Medication  Dose Route Frequency Provider Last Rate Last Admin   acetaminophen (TYLENOL) tablet 650 mg  650 mg Oral Q6H PRN Shalhoub, Sherryll Burger, MD       Or   acetaminophen (TYLENOL) suppository 650 mg  650 mg Rectal Q6H PRN Shalhoub, Sherryll Burger, MD       aspirin EC tablet 81 mg  81 mg Oral Daily Shalhoub, Sherryll Burger, MD   81 mg at 06/23/21 0919   calcium carbonate (TUMS - dosed in mg elemental calcium) chewable tablet 2,000 mg  2,000 mg Oral TID WC Shalhoub, Sherryll Burger, MD   2,000 mg at 06/23/21 1217   carvedilol (COREG) tablet 37.5 mg  37.5 mg Oral BID WC Vernelle Emerald, MD   37.5 mg at 06/23/21 0906   cefTRIAXone (ROCEPHIN) 2 g in sodium chloride 0.9 % 100 mL IVPB  2 g Intravenous Q24H Lavina Hamman, MD       Chlorhexidine Gluconate Cloth 2 % PADS 6 each  6 each Topical Q0600 Penninger, Ria Comment, PA   6 each at 06/23/21 1106   gabapentin (NEURONTIN) capsule 100 mg  100 mg Oral TID PRN Vernelle Emerald, MD       HYDROmorphone (DILAUDID) injection 0.5 mg  0.5 mg Intravenous Q4H PRN Lavina Hamman, MD       insulin aspart (novoLOG) injection 0-9 Units  0-9 Units  Subcutaneous Q6H Lavina Hamman, MD       insulin glargine (LANTUS) injection 12 Units  12 Units Subcutaneous QHS Lavina Hamman, MD       ivabradine [facility]) tablet 5 mg  5 mg Oral BID WC Shalhoub, Sherryll Burger, MD   5 mg at 06/23/21 1937   lanthanum (FOSRENOL) chewable tablet 3,000 mg  3,000 mg Oral TID WC Penninger, Ria Comment, PA       sacubitril-valsartan (ENTRESTO) 97-103 mg per tablet  1 tablet Oral BID Vernelle Emerald, MD   1 tablet at 06/23/21 0919    Allergies as of 06/22/2021 - Review Complete 06/22/2021  Allergen Reaction Noted   Penicillins Rash and Other (See Comments) 03/11/2012     Review of Systems:    Constitutional: No weight loss, fever or chills Skin: No rash  Cardiovascular: No chest pain Respiratory: No SOB  Gastrointestinal: See HPI and otherwise negative Genitourinary: No dysuria Neurological: No headache, dizziness or syncope Musculoskeletal: No new muscle or joint pain Hematologic: No bleeding  Psychiatric: No history of depression or anxiety    Physical Exam:  Vital signs in last 24 hours: Temp:  [98.3 F (36.8 C)-98.4 F (36.9 C)] 98.4 F (36.9 C) (07/02 0740) Pulse Rate:  [65-81] 70 (07/02 0740) Resp:  [10-20] 20 (07/02 0740) BP: (128-163)/(72-84) 128/72 (07/02 0900) SpO2:  [95 %-98 %] 98 % (07/02 0740) Weight:  [88.5 kg] 88.5 kg (07/01 1735) Last BM Date: 06/23/21 General:   Pleasant AA male appears to be in NAD, Well developed, Well nourished, alert and cooperative Head:  Normocephalic and atraumatic. Eyes:   PEERL, EOMI. No icterus. Conjunctiva pink. Ears:  Normal auditory acuity. Neck:  Supple Throat: Oral cavity and pharynx without inflammation, swelling or lesion. Teeth in good condition. Lungs: Respirations even and unlabored. Lungs clear to auscultation bilaterally.   No wheezes, crackles, or rhonchi.  Heart: Normal S1, S2. No MRG. Regular rate and rhythm. No peripheral edema, cyanosis or pallor.  Abdomen:  Soft, nondistended,  nontender. No rebound or guarding. Normal bowel sounds. No appreciable masses or hepatomegaly. Rectal:  Not  performed.  Msk:  Symmetrical without gross deformities. Peripheral pulses intact.  Extremities:  Without edema, no deformity or joint abnormality.  Neurologic:  Alert and  oriented x4;  grossly normal neurologically.  Skin:   Dry and intact without significant lesions or rashes. Psychiatric: Demonstrates good judgement and reason without abnormal affect or behaviors.   LAB RESULTS: Recent Labs    06/22/21 2021  WBC 6.9  HGB 9.7*  HCT 30.5*  PLT 204   BMET Recent Labs    06/22/21 2021  NA 137  K 4.4  CL 97*  CO2 30  GLUCOSE 85  BUN 44*  CREATININE 6.75*  CALCIUM 8.2*   Hepatic Function Latest Ref Rng & Units 06/23/2021 06/22/2021 02/06/2019  Total Protein 6.5 - 8.1 g/dL 6.9 7.4 7.6  Albumin 3.5 - 5.0 g/dL 3.2(L) 3.6 3.6  AST 15 - 41 U/L 332(H) 431(H) 116(H)  ALT 0 - 44 U/L 510(H) 547(H) 150(H)  Alk Phosphatase 38 - 126 U/L 209(H) 229(H) 229(H)  Total Bilirubin 0.3 - 1.2 mg/dL 4.0(H) 5.0(H) 1.0  Bilirubin, Direct 0.0 - 0.2 mg/dL 2.4(H) - -     PT/INR Recent Labs    06/23/21 0824  LABPROT 13.8  INR 1.1    STUDIES: MR ABDOMEN MRCP W WO CONTAST  Result Date: 06/23/2021 CLINICAL DATA:  Jaundice. EXAM: MRI ABDOMEN WITHOUT AND WITH CONTRAST (INCLUDING MRCP) TECHNIQUE: Multiplanar multisequence MR imaging of the abdomen was performed both before and after the administration of intravenous contrast. Heavily T2-weighted images of the biliary and pancreatic ducts were obtained, and three-dimensional MRCP images were rendered by post processing. CONTRAST:  33m GADAVIST GADOBUTROL 1 MMOL/ML IV SOLN COMPARISON:  Noncontrast CT on 02/06/2019 FINDINGS: Lower chest: No acute findings. Hepatobiliary: No hepatic masses identified. Diffusely decreased T2 signal intensity of hepatic parenchyma is consistent with increased iron deposition. Numerous tiny gallstones are seen nearly  filling the gallbladder. Diffuse gallbladder wall thickening is seen as well as mild pericholecystic edema, consistent with acute cholecystitis. No evidence of biliary ductal dilatation with common bile duct measuring 5 mm in diameter. However, 2 tiny calculi are seen in the distal common bile duct, largest measuring 5 mm. Pancreas: No mass or inflammatory changes. No evidence of pancreatic ductal dilatation or pancreas divisum. Spleen: Within normal limits in size. No masses identified. Diffuse T2 signal intensity of splenic parenchyma is consistent with increased iron deposition. Adrenals/Urinary Tract: No masses identified. A few tiny sub-cm renal cysts are seen bilaterally. No evidence of hydronephrosis. Stomach/Bowel: Visualized portion unremarkable. Vascular/Lymphatic: No pathologically enlarged lymph nodes identified. No acute vascular findings. Other:  None. Musculoskeletal: No suspicious bone lesions identified. Diffuse T2 hypointensity of the marrow signal seen, consistent with increased iron deposition. IMPRESSION: Findings consistent with acute cholecystitis. Two tiny distal common bile duct calculi, largest measuring 5 mm. No evidence of biliary ductal dilatation. Increased iron deposition within the liver, spleen, and bone marrow, consistent with hemosiderosis. Electronically Signed   By: JMarlaine HindM.D.   On: 06/23/2021 10:52   [place]Abdomen Limited RUQ (LIVER/GB)  Result Date: 06/22/2021 CLINICAL DATA:  Abdominal pain, nausea, abnormal labs. EXAM: ULTRASOUND ABDOMEN LIMITED RIGHT UPPER QUADRANT COMPARISON:  CT 01/22/2021 FINDINGS: Gallbladder: Cholelithiasis with multiple small stones filling the gallbladder. Gallbladder wall thickening with wall thickness measuring 4.1 mm. Mild edema. Murphy's sign is negative. Common bile duct: Diameter: 6 mm, normal Liver: No focal lesion identified. Within normal limits in parenchymal echogenicity. Portal vein is patent on color Doppler imaging with normal  direction  of blood flow towards the liver. Other: None. IMPRESSION: Cholelithiasis with gallbladder wall thickening and edema. This may indicate cholecystitis in the appropriate clinical setting although the Murphy's sign is negative. Electronically Signed   By: Lucienne Capers M.D.   On: 06/22/2021 22:01     Impression / Plan:   Impression: 1.  Acute cholecystitis with choledocholithiasis: MRCP as above 2.  ESRD: On HD Tuesday Thursday Saturday 3.  Anemia of CKD: Hemoglobin 9.7 4.  CHF: Last EF 45%  Plan: 1.  Scheduled patient for an ERCP tomorrow with Dr. Watt Climes who is covering our service for ERCP over the weekend.  Did discuss risks, benefits, limitations and alternatives and the patient agrees to proceed. 2.  Continue to monitor LFTs 3.  Continue supportive measures 4.  Patient can be on regular diet today and n.p.o. at midnight 5.  Recommend consulting the surgical service as well. 6.  Please await any further recommendations from Dr. Silverio Decamp later today.  Thank you for your kind consultation, we will continue to follow.  [name] [name]  06/23/2021, 1:46 PM   Attending physician's note   I have taken a history, examined the patient and reviewed the chart. I agree with the Advanced Practitioner's note, impression and recommendations.  55 year old gentleman with history of end-stage renal disease on hemodialysis, CHF presented to [place] with abnormal LFT. MRCP with multiple gallstones cholecystitis and 2 small choledocholithiasis  He is currently hemodynamically stable Normal WBC count Afebrile  Plan to proceed with ERCP tomorrow with Dr. Watt Climes He is undergoing dialysis this afternoon N.p.o. after midnight  The risks and benefits as well as alternatives of endoscopic procedure(s) have been discussed and reviewed. All questions answered. The patient agrees to proceed.   The patient was provided an opportunity to ask questions and all were answered. The  patient agreed with the plan and demonstrated an understanding of the instructions.  Damaris Hippo , MD [phone]

## 2021-06-23 NOTE — H&P (Signed)
History and Physical    [name] [name] MRN:[id number] DOB: 02/07/1966 DOA: 06/22/2021  PCP: Williams, Breejante J, PA-C  Patient coming from: [facility] ED   Chief Complaint:  Chief Complaint  Patient presents with   Abdominal Pain     HPI:    55-year-old male with past medical history of end-stage renal disease (Tues, Thurs Sat), hypertension, insulin-dependent diabetes mellitus type 2, diastolic congestive heart failure, anemia of chronic disease, hyperlipidemia who presents to [facility] telemetry unit as a transfer from [facility] department after experiencing nausea vomiting and abdominal pain.  Patient explains that approximately 3 weeks ago while traveling to see a friend in [place] he suddenly began to experience an episode of intense nausea and several bouts of nonbilious nonbloody vomiting.  This was associated with moderate nonradiating epigastric abdominal pain.  The symptoms lasted for approximately 24 hours and spontaneously resolved.  Then, on 6/30, as the patient was receiving dialysis he had yet another similar episode of symptoms.  He vomited his breakfast he had eaten earlier in the day during dialysis.  Patient once again began to experience epigastric abdominal pain.  This nausea and epigastric pain continues to persist until the afternoon but he attempted to go to work anyway.  While attempting to go to work patient experienced additional episodes of nonbilious nonbloody vomiting.  At this point the patient went to go see his primary care provider.  Primary care provider ordered some basic labs and the patient went home.  Patient states that his nausea and abdominal discomfort was beginning to gradually subside overnight.  The following day on 7/1 patient was contacted at work and was notified that his liver enzymes were abnormal and was instructed to go to the emergency department.  Upon further questioning, the patient denies sick  contacts, recent travel, recent antibiotic use, alcohol use, drug use or contact with confirmed COVID-19 infection.  Upon evaluation in the emergency department AST found to be 431, ALT found to be 547, total bilirubin found to be 5.0, alkaline phosphatase found to be 229.  Ultrasound of the right upper quadrant was performed revealing cholelithiasis with gallbladder wall thickening and edema but Murphy sign was negative.  While in the emergency department patient also experienced several episodes of watery diarrhea.  ER provider discussed case with Dr. Magod who recommended transferring patient to the hospital, obtaining an MRCP and then calling a formal consultation based on these results.  The hospitalist group was then contacted and patient was accepted to the medical floor at [facility], due to the fact the patient has a history of ESRD.  Review of Systems:   Review of Systems  Gastrointestinal:  Positive for abdominal pain, diarrhea, nausea and vomiting.   Past Medical History:  Diagnosis Date   Bacteremia    Diabetes mellitus    Dialysis patient (HCC)    Hypertension    Pneumonia 02/2012   Strep pneumoniae bilateral pneumonia complicated by bacteremia   Renal disorder    End stage, not on dialysis    Past Surgical History:  Procedure Laterality Date   AMPUTATION TOE     AV FISTULA PLACEMENT Left 05/22/2018   Procedure: Creation of BrachiaCephalic Fistula Left Arm;  Surgeon: Dickson, Christopher S, MD;  Location: [facility] OR;  Service: Vascular;  Laterality: Left;   BASCILIC VEIN TRANSPOSITION Left 11/12/2018   Procedure: BASILIC VEIN TRANSPOSITION LEFT ARM;  Surgeon: Brabham, Vance W, MD;  Location: [facility] OR;  Service: Vascular;    Laterality: Left;   CHEST TUBE INSERTION     placed during hospitalization 02/2012   COLONOSCOPY  04/01/2012   Procedure: COLONOSCOPY;  Surgeon: Jyothi Mann, MD;  Location: [facility] ENDOSCOPY;  Service: Endoscopy;  Laterality: N/A;   ESOPHAGOGASTRODUODENOSCOPY   03/31/2012   Procedure: ESOPHAGOGASTRODUODENOSCOPY (EGD);  Surgeon: Patrick D Hung, MD;  Location: [facility] ENDOSCOPY;  Service: Endoscopy;  Laterality: N/A;   EXCHANGE OF A DIALYSIS CATHETER Right 05/22/2018   Procedure: EXCHANGE OF A DIALYSIS CATHETER;  Surgeon: Dickson, Christopher S, MD;  Location: [facility] OR;  Service: Vascular;  Laterality: Right;   IR FLUORO GUIDE CV LINE RIGHT  05/20/2018   IR THROMBECTOMY AV FISTULA W/THROMBOLYSIS INC/SHUNT/IMG LEFT Left 09/10/2018   IR US GUIDE VASC ACCESS LEFT  09/10/2018   IR US GUIDE VASC ACCESS RIGHT  05/20/2018     reports that he has never smoked. He has never used smokeless tobacco. He reports current alcohol use. He reports that he does not use drugs.  Allergies  Allergen Reactions   Penicillins Rash and Other (See Comments)    Tolerating Ceftriaxone (03/12/12) Has patient had a PCN reaction causing immediate rash, facial/tongue/throat swelling, SOB or lightheadedness with hypotension: Yes Has patient had a PCN reaction causing severe rash involving mucus membranes or skin necrosis: Unk Has patient had a PCN reaction that required hospitalization: No Has patient had a PCN reaction occurring within the last 10 years: No If all of the above answers are "NO", then may proceed with Cephalosporin use.     Family History  Problem Relation Age of Onset   Diabetes Maternal Grandmother      Prior to Admission medications   Medication Sig Start Date End Date Taking? Authorizing Provider  aspirin 81 MG tablet Take 81 mg by mouth every morning.     [provider]  b complex-vitamin c-folic acid (NEPHRO-VITE) 0.8 MG TABS tablet Take 1 tablet by mouth daily.    [provider]  Blood Glucose Monitoring Suppl (ONETOUCH VERIO FLEX SYSTEM) w/Device KIT Use to check blood sugar twice daily. 04/21/20   Kumar, Ajay, MD  calcium elemental as carbonate (BARIATRIC TUMS ULTRA) 400 MG chewable tablet Chew 2,000 mg by mouth 3 (three) times daily.     [provider]  carvedilol (COREG) 25 MG tablet Take 1.5 tablets (37.5 mg total) by mouth 2 (two) times daily with a meal. 03/25/17   Kelly, Thomas A, MD  Continuous Blood Gluc Receiver (FREESTYLE LIBRE 14 DAY READER) DEVI 1 each by Does not apply route See admin instructions. Use to monitor blood sugars. 03/13/20   Kumar, Ajay, MD  Continuous Blood Gluc Sensor (FREESTYLE LIBRE 2 SENSOR) MISC USE AS DIRECTED 01/30/20   Kumar, Ajay, MD  ethyl chloride spray Apply 1 application topically 3 (three) times a week. Apply 1 spray to skin three times weekly.    [provider]  gabapentin (NEURONTIN) 100 MG capsule Take 100 mg by mouth 3 (three) times daily as needed.    [provider]  glucose blood test strip Use Onetouch verio test strips as instructed to check blood sugar twice daily. 04/21/20   Kumar, Ajay, MD  insulin aspart (NOVOLOG FLEXPEN) 100 UNIT/ML FlexPen 5 to 7 units for breakfast and lunch and 10 to 14 units at dinner based on meal size and carbohydrateS 12/10/19   Kumar, Ajay, MD  insulin degludec (TRESIBA FLEXTOUCH) 100 UNIT/ML FlexTouch Pen Inject 0.38 mLs (38 Units total) into the skin daily. 04/12/20   Kumar, Ajay,   MD  ivabradine (CORLANOR) 5 MG TABS tablet Take 5 mg by mouth 2 (two) times daily with a meal.    [provider]  lanthanum (FOSRENOL) 1000 MG chewable tablet Chew 1 tablet (1,000 mg total) by mouth 3 (three) times daily with meals. Patient taking differently: Chew 2,000 mg by mouth 3 (three) times daily with meals. 05/25/18   Joseph, Preetha, MD  OneTouch Delica Lancets 30G MISC Use onetouch delica lancets to check blood sugar twice daily. 04/21/20   Kumar, Ajay, MD  sacubitril-valsartan (ENTRESTO) 49-51 MG Take 1 tablet by mouth 2 (two) times daily.    [provider]  Vitamin D, Ergocalciferol, (DRISDOL) 1.25 MG (50000 UNIT) CAPS capsule Take 50,000 Units by mouth every 7 (seven) days.    [provider]    Physical  Exam: Vitals:   06/23/21 0000 06/23/21 0030 06/23/21 0100 06/23/21 0415  BP: (!) 163/84 (!) 160/79 (!) 152/79 (!) 155/73  Pulse: 66 65 66 81  Resp: 18 14 18 16  Temp:    98.3 F (36.8 C)  TempSrc:    Oral  SpO2: 98% 98% 96% 98%  Weight:      Height:        Constitutional: Awake alert and oriented x3, no associated distress.   Skin: Notable mild jaundice of the skin.  No rashes, no lesions, good skin turgor noted. Eyes: Pupils are equally reactive to light.  Some scleral icterus noted without significant conjunctival pallor.   ENMT: Moist mucous membranes noted.  Posterior pharynx clear of any exudate or lesions.   Neck: normal, supple, no masses, no thyromegaly.  No evidence of jugular venous distension.   Respiratory: clear to auscultation bilaterally, no wheezing, no crackles. Normal respiratory effort. No accessory muscle use.  Cardiovascular: Regular rate and rhythm, no murmurs / rubs / gallops. No extremity edema. 2+ pedal pulses. No carotid bruits.  Chest:   Nontender without crepitus or deformity.   Back:   Nontender without crepitus or deformity. Abdomen: Abdomen is soft and nontender.  No evidence of intra-abdominal masses.  Positive bowel sounds noted in all quadrants.   Musculoskeletal: No joint deformity upper and lower extremities. Good ROM, no contractures. Normal muscle tone.  Neurologic: CN 2-12 grossly intact. Sensation intact.  Patient moving all 4 extremities spontaneously.  Patient is following all commands.  Patient is responsive to verbal stimuli.   Psychiatric: Patient exhibits normal mood with appropriate affect.  Patient seems to possess insight as to their current situation.     Labs on Admission: I have personally reviewed following labs and imaging studies -   CBC: Recent Labs  Lab 06/22/21 2021  WBC 6.9  NEUTROABS 4.8  HGB 9.7*  HCT 30.5*  MCV 100.0  PLT 204   Basic Metabolic Panel: Recent Labs  Lab 06/22/21 2021  NA 137  K 4.4  CL 97*   CO2 30  GLUCOSE 85  BUN 44*  CREATININE 6.75*  CALCIUM 8.2*   GFR: Estimated Creatinine Clearance: 15.7 mL/min (A) (by C-G formula based on SCr of 6.75 mg/dL (H)). Liver Function Tests: Recent Labs  Lab 06/22/21 2021  AST 431*  ALT 547*  ALKPHOS 229*  BILITOT 5.0*  PROT 7.4  ALBUMIN 3.6   Recent Labs  Lab 06/22/21 2021  LIPASE 37   No results for input(s): AMMONIA in the last 168 hours. Coagulation Profile: No results for input(s): INR, PROTIME in the last 168 hours. Cardiac Enzymes: No results for input(s): CKTOTAL, CKMB, CKMBINDEX,   TROPONINI in the last 168 hours. BNP (last 3 results) No results for input(s): PROBNP in the last 8760 hours. HbA1C: No results for input(s): HGBA1C in the last 72 hours. CBG: No results for input(s): GLUCAP in the last 168 hours. Lipid Profile: No results for input(s): CHOL, HDL, LDLCALC, TRIG, CHOLHDL, LDLDIRECT in the last 72 hours. Thyroid Function Tests: No results for input(s): TSH, T4TOTAL, FREET4, T3FREE, THYROIDAB in the last 72 hours. Anemia Panel: No results for input(s): VITAMINB12, FOLATE, FERRITIN, TIBC, IRON, RETICCTPCT in the last 72 hours. Urine analysis:    Component Value Date/Time   COLORURINE YELLOW 05/20/2018 0711   APPEARANCEUR HAZY (A) 05/20/2018 0711   LABSPEC 1.015 05/20/2018 0711   PHURINE 5.5 05/20/2018 0711   GLUCOSEU NEGATIVE 05/20/2018 0711   HGBUR SMALL (A) 05/20/2018 0711   BILIRUBINUR NEGATIVE 05/20/2018 0711   BILIRUBINUR negative 04/20/2018 1452   KETONESUR NEGATIVE 05/20/2018 0711   PROTEINUR 100 (A) 05/20/2018 0711   UROBILINOGEN 0.2 04/20/2018 1452   UROBILINOGEN 0.2 04/09/2012 2229   NITRITE NEGATIVE 05/20/2018 0711   LEUKOCYTESUR NEGATIVE 05/20/2018 0711    Radiological Exams on Admission - Personally Reviewed: US Abdomen Limited RUQ (LIVER/GB)  Result Date: 06/22/2021 CLINICAL DATA:  Abdominal pain, nausea, abnormal labs. EXAM: ULTRASOUND ABDOMEN LIMITED RIGHT UPPER QUADRANT  COMPARISON:  CT 01/22/2021 FINDINGS: Gallbladder: Cholelithiasis with multiple small stones filling the gallbladder. Gallbladder wall thickening with wall thickness measuring 4.1 mm. Mild edema. Murphy's sign is negative. Common bile duct: Diameter: 6 mm, normal Liver: No focal lesion identified. Within normal limits in parenchymal echogenicity. Portal vein is patent on color Doppler imaging with normal direction of blood flow towards the liver. Other: None. IMPRESSION: Cholelithiasis with gallbladder wall thickening and edema. This may indicate cholecystitis in the appropriate clinical setting although the Murphy's sign is negative. Electronically Signed   By: Lucienne Capers M.D.   On: 06/22/2021 22:01    EKG: Personally reviewed.  Rhythm is normal sinus rhythm with heart rate of 60 bpm.  Evidence of left bundle branch block.  No dynamic ST segment changes appreciated.  Assessment/Plan Principal Problem:   Nausea vomiting and diarrhea in the setting of abnormal liver enzymes  Patient presenting with episodic nausea vomiting and occasional diarrhea with epigastric abdominal pain This is coupled with significant derangement of patient's hepatic function panel including substantial transaminitis, hyperbilirubinemia and elevated alkaline phosphatase Right upper quadrant ultrasound reveals some gallbladder wall thickening and edema with cholelithiasis but is not definitive. ER provider discussed case with gastroenterology.  They recommended obtaining an MRCP followed by obtaining a formal consultation from the appropriate service based on these results. Based on the results of MRCP will consult either general surgery or gastroenterology In the meantime we will perform serial hepatic function panel to trend liver enzymes Obtaining viral hepatitis panel, PT, PTT, GGT, HIV testing urine toxicology screen, BNP, echocardiogram and creatinine kinase   Active Problems:  End-stage renal disease  We will  formally consult nephrology for resumption of dialysis Patient will need hemodialysis within 24 hours of contrast based MRI imaging    Essential hypertension  Continue home regimen of antihypertensive therapy    Chronic systolic and diastolic CHF (congestive heart failure) (HCC)  No evidence of cardiogenic volume overload at this time    Type 2 diabetes mellitus with diabetic polyneuropathy, with long-term current use of insulin ([place])  Accu-Cheks before every meal and nightly with sliding scale insulin Continue home regimen of insulin therapy Diabetic diet Hemoglobin A1c pending  Anemia of chronic disease  Obtaining iron panel, folate, B12 No clinical evidence of bleeding Monitoring hemoglobin and hematocrit with serial CBCs.    Diabetic polyneuropathy associated with type 2 diabetes mellitus (HCC)  Continue home regimen of gabapentin    Code Status:  Full code Family Communication: Deferred  Status is: Inpatient  Remains inpatient appropriate because:Ongoing diagnostic testing needed not appropriate for outpatient work up, IV treatments appropriate due to intensity of illness or inability to take PO, and Inpatient level of care appropriate due to severity of illness  Dispo: The patient is from: Home              Anticipated d/c is to: Home              Patient currently is not medically stable to d/c.   Difficult to place patient No        George J Shalhoub MD [facility] Pager [phone]  If 7PM-7AM, please contact night-coverage [email or web address] Use universal [place] password for that web site. If you do not have the password, please call the hospital operator.  06/23/2021, 7:15 AM    

## 2021-06-24 ENCOUNTER — Inpatient Hospital Stay (HOSPITAL_COMMUNITY): Payer: Medicare Other

## 2021-06-24 ENCOUNTER — Encounter (HOSPITAL_COMMUNITY)
Admission: EM | Disposition: A | Discharge status: Home / Self Care | Payer: Self-pay | Source: Home / Self Care | Attending: Internal Medicine

## 2021-06-24 ENCOUNTER — Encounter (HOSPITAL_COMMUNITY): Payer: Self-pay | Admitting: Internal Medicine

## 2021-06-24 ENCOUNTER — Inpatient Hospital Stay (HOSPITAL_COMMUNITY): Payer: Medicare Other | Admitting: Certified Registered Nurse Anesthetist

## 2021-06-24 ENCOUNTER — Other Ambulatory Visit: Payer: Self-pay

## 2021-06-24 DIAGNOSIS — I5032 Chronic diastolic (congestive) heart failure: Secondary | ICD-10-CM

## 2021-06-24 HISTORY — PX: STENT REMOVAL: SHX6421

## 2021-06-24 HISTORY — PX: PANCREATIC STENT PLACEMENT: SHX5539

## 2021-06-24 HISTORY — PX: ERCP: SHX5425

## 2021-06-24 HISTORY — PX: SPHINCTEROTOMY: SHX5279

## 2021-06-24 HISTORY — PX: REMOVAL OF STONES: SHX5545

## 2021-06-24 LAB — CBC
HCT: 31 % — ABNORMAL LOW (ref 39.0–52.0)
Hemoglobin: 9.6 g/dL — ABNORMAL LOW (ref 13.0–17.0)
MCH: 31.3 pg (ref 26.0–34.0)
MCHC: 31 g/dL (ref 30.0–36.0)
MCV: 101 fL — ABNORMAL HIGH (ref 80.0–100.0)
Platelets: 175 10*3/uL (ref 150–400)
RBC: 3.07 MIL/uL — ABNORMAL LOW (ref 4.22–5.81)
RDW: 14.6 % (ref 11.5–15.5)
WBC: 5.9 10*3/uL (ref 4.0–10.5)
nRBC: 0 % (ref 0.0–0.2)

## 2021-06-24 LAB — GLUCOSE, CAPILLARY
Glucose-Capillary: 312 mg/dL — ABNORMAL HIGH (ref 70–99)
Glucose-Capillary: 250 mg/dL — ABNORMAL HIGH (ref 70–99)
Glucose-Capillary: 191 mg/dL — ABNORMAL HIGH (ref 70–99)
Glucose-Capillary: 148 mg/dL — ABNORMAL HIGH (ref 70–99)
Glucose-Capillary: 138 mg/dL — ABNORMAL HIGH (ref 70–99)
Glucose-Capillary: 199 mg/dL — ABNORMAL HIGH (ref 70–99)
Glucose-Capillary: 218 mg/dL — ABNORMAL HIGH (ref 70–99)

## 2021-06-24 LAB — COMPREHENSIVE METABOLIC PANEL
ALT: 381 U/L — ABNORMAL HIGH (ref 0–44)
AST: 176 U/L — ABNORMAL HIGH (ref 15–41)
Albumin: 3 g/dL — ABNORMAL LOW (ref 3.5–5.0)
Alkaline Phosphatase: 200 U/L — ABNORMAL HIGH (ref 38–126)
Anion gap: 11 (ref 5–15)
BUN: 31 mg/dL — ABNORMAL HIGH (ref 6–20)
CO2: 26 mmol/L (ref 22–32)
Calcium: 8 mg/dL — ABNORMAL LOW (ref 8.9–10.3)
Chloride: 95 mmol/L — ABNORMAL LOW (ref 98–111)
Creatinine, Ser: 5.96 mg/dL — ABNORMAL HIGH (ref 0.61–1.24)
GFR, Estimated: 11 mL/min — ABNORMAL LOW (ref >60)
Glucose, Bld: 338 mg/dL — ABNORMAL HIGH (ref 70–99)
Potassium: 3.9 mmol/L (ref 3.5–5.1)
Sodium: 132 mmol/L — ABNORMAL LOW (ref 135–145)
Total Bilirubin: 1.4 mg/dL — ABNORMAL HIGH (ref 0.3–1.2)
Total Protein: 6.6 g/dL (ref 6.5–8.1)

## 2021-06-24 LAB — ECHOCARDIOGRAM COMPLETE
Area-P 1/2: 2.95 cm2
Calc EF: 28.5 %
Height: 78 in
S' Lateral: 5.1 cm
Single Plane A2C EF: 32.5 %
Single Plane A4C EF: 33.3 %
Weight: 3033.53 oz

## 2021-06-24 LAB — PHOSPHORUS: Phosphorus: 4.3 mg/dL (ref 2.5–4.6)

## 2021-06-24 LAB — SURGICAL PCR SCREEN
MRSA, PCR: NEGATIVE
Staphylococcus aureus: NEGATIVE

## 2021-06-24 SURGERY — ERCP, WITH INTERVENTION IF INDICATED
Anesthesia: General

## 2021-06-24 MED ORDER — MIDAZOLAM HCL 2 MG/2ML IJ SOLN
INTRAMUSCULAR | Status: AC
  Filled 2021-06-24: qty 2

## 2021-06-24 MED ORDER — ROCURONIUM BROMIDE 10 MG/ML (PF) SYRINGE
PREFILLED_SYRINGE | INTRAVENOUS | Status: DC | PRN
  Administered 2021-06-24: 50 mg via INTRAVENOUS

## 2021-06-24 MED ORDER — PROPOFOL 10 MG/ML IV BOLUS
INTRAVENOUS | Status: DC | PRN
  Administered 2021-06-24: 170 mg via INTRAVENOUS

## 2021-06-24 MED ORDER — FENTANYL CITRATE (PF) 250 MCG/5ML IJ SOLN
INTRAMUSCULAR | Status: AC
  Filled 2021-06-24: qty 5

## 2021-06-24 MED ORDER — SODIUM CHLORIDE 0.9 % IV SOLN: INTRAVENOUS | Status: DC

## 2021-06-24 MED ORDER — INDOMETHACIN 50 MG RE SUPP
RECTAL | Status: AC
  Filled 2021-06-24: qty 2

## 2021-06-24 MED ORDER — GLUCAGON HCL RDNA (DIAGNOSTIC) 1 MG IJ SOLR
INTRAMUSCULAR | Status: AC
  Filled 2021-06-24: qty 1

## 2021-06-24 MED ORDER — INDOMETHACIN 50 MG RE SUPP
RECTAL | Status: DC | PRN
  Administered 2021-06-24: 100 mg via RECTAL

## 2021-06-24 MED ORDER — CALCITRIOL 0.5 MCG PO CAPS
0.7500 ug | ORAL_CAPSULE | ORAL | Status: DC
  Administered 2021-06-26: 0.75 ug via ORAL
  Filled 2021-06-24: qty 1

## 2021-06-24 MED ORDER — MENTHOL 3 MG MT LOZG
1.0000 | LOZENGE | OROMUCOSAL | Status: DC | PRN
  Filled 2021-06-24: qty 9

## 2021-06-24 MED ORDER — LIDOCAINE 2% (20 MG/ML) 5 ML SYRINGE
INTRAMUSCULAR | Status: DC | PRN
  Administered 2021-06-24: 80 mg via INTRAVENOUS

## 2021-06-24 MED ORDER — MUPIROCIN 2 % EX OINT: 1.0000 "application " | TOPICAL_OINTMENT | Freq: Two times a day (BID) | CUTANEOUS | Status: DC

## 2021-06-24 MED ORDER — SUGAMMADEX SODIUM 200 MG/2ML IV SOLN
INTRAVENOUS | Status: DC | PRN
  Administered 2021-06-24: 200 mg via INTRAVENOUS

## 2021-06-24 MED ORDER — PROPOFOL 10 MG/ML IV BOLUS
INTRAVENOUS | Status: AC
  Filled 2021-06-24: qty 20

## 2021-06-24 MED ORDER — EPHEDRINE SULFATE 50 MG/ML IJ SOLN
INTRAMUSCULAR | Status: DC | PRN
  Administered 2021-06-24: 10 mg via INTRAVENOUS

## 2021-06-24 MED ORDER — FENTANYL CITRATE (PF) 250 MCG/5ML IJ SOLN
INTRAMUSCULAR | Status: DC | PRN
  Administered 2021-06-24: 100 ug via INTRAVENOUS

## 2021-06-24 MED ORDER — SODIUM CHLORIDE 0.9 % IV SOLN
INTRAVENOUS | Status: DC | PRN
  Administered 2021-06-24: 20 mL

## 2021-06-24 MED ORDER — INDOMETHACIN 50 MG RE SUPP: 100.0000 mg | Freq: Once | RECTAL | Status: DC

## 2021-06-24 NOTE — H&P (View-Only) (Signed)
Day of Surgery   Subjective/Chief Complaint: S/p ERCP today  CBD stone extracted    Objective: Vital signs in last 24 hours: Temp:  [98 F (36.7 C)-98.7 F (37.1 C)] 98 F (36.7 C) (07/03 1121) Pulse Rate:  [58-72] 58 (07/03 1140) Resp:  [12-20] 14 (07/03 1140) BP: (85-160)/(7-82) 156/82 (07/03 1140) SpO2:  [99 %-100 %] 100 % (07/03 1140) Weight:  [86 kg] 86 kg (07/03 0904) Last BM Date: 06/23/21  Intake/Output from previous day: 07/02 0701 - 07/03 0700 In: 120 [P.O.:120] Out: 1603 [Urine:125] Intake/Output this shift: Total I/O In: 500 [I.V.:500] Out: -  PE:  Constitutional: No acute distress, conversant, appears states age. Eyes: Anicteric sclerae, moist conjunctiva, no lid lag Lungs: Clear to auscultation bilaterally, normal respiratory effort CV: regular rate and rhythm, no murmurs, no peripheral edema, pedal pulses 2+ GI: Soft, no masses or hepatosplenomegaly, non-tender to palpation Skin: No rashes, palpation reveals normal turgor Psychiatric: appropriate judgment and insight, oriented to person, place, and time    Lab Results:  Recent Labs    06/22/21 2021 06/24/21 0625  WBC 6.9 5.9  HGB 9.7* 9.6*  HCT 30.5* 31.0*  PLT 204 175   BMET Recent Labs    06/22/21 2021 06/24/21 0625  NA 137 132*  K 4.4 3.9  CL 97* 95*  CO2 30 26  GLUCOSE 85 338*  BUN 44* 31*  CREATININE 6.75* 5.96*  CALCIUM 8.2* 8.0*   PT/INR Recent Labs    06/23/21 0824  LABPROT 13.8  INR 1.1   ABG No results for input(s): PHART, HCO3 in the last 72 hours.  Invalid input(s): PCO2, PO2  Studies/Results: MR ABDOMEN MRCP W WO CONTAST  Result Date: 06/23/2021 CLINICAL DATA:  Jaundice. EXAM: MRI ABDOMEN WITHOUT AND WITH CONTRAST (INCLUDING MRCP) TECHNIQUE: Multiplanar multisequence MR imaging of the abdomen was performed both before and after the administration of intravenous contrast. Heavily T2-weighted images of the biliary and pancreatic ducts were obtained, and  three-dimensional MRCP images were rendered by post processing. CONTRAST:  61mL GADAVIST GADOBUTROL 1 MMOL/ML IV SOLN COMPARISON:  Noncontrast CT on 02/06/2019 FINDINGS: Lower chest: No acute findings. Hepatobiliary: No hepatic masses identified. Diffusely decreased T2 signal intensity of hepatic parenchyma is consistent with increased iron deposition. Numerous tiny gallstones are seen nearly filling the gallbladder. Diffuse gallbladder wall thickening is seen as well as mild pericholecystic edema, consistent with acute cholecystitis. No evidence of biliary ductal dilatation with common bile duct measuring 5 mm in diameter. However, 2 tiny calculi are seen in the distal common bile duct, largest measuring 5 mm. Pancreas: No mass or inflammatory changes. No evidence of pancreatic ductal dilatation or pancreas divisum. Spleen: Within normal limits in size. No masses identified. Diffuse T2 signal intensity of splenic parenchyma is consistent with increased iron deposition. Adrenals/Urinary Tract: No masses identified. A few tiny sub-cm renal cysts are seen bilaterally. No evidence of hydronephrosis. Stomach/Bowel: Visualized portion unremarkable. Vascular/Lymphatic: No pathologically enlarged lymph nodes identified. No acute vascular findings. Other:  None. Musculoskeletal: No suspicious bone lesions identified. Diffuse T2 hypointensity of the marrow signal seen, consistent with increased iron deposition. IMPRESSION: Findings consistent with acute cholecystitis. Two tiny distal common bile duct calculi, largest measuring 5 mm. No evidence of biliary ductal dilatation. Increased iron deposition within the liver, spleen, and bone marrow, consistent with hemosiderosis. Electronically Signed   By: Marlaine Hind M.D.   On: 06/23/2021 10:52   US Abdomen Limited RUQ (LIVER/GB)  Result Date: 06/22/2021 CLINICAL DATA:  Abdominal pain,  nausea, abnormal labs. EXAM: ULTRASOUND ABDOMEN LIMITED RIGHT UPPER QUADRANT COMPARISON:  CT  01/22/2021 FINDINGS: Gallbladder: Cholelithiasis with multiple small stones filling the gallbladder. Gallbladder wall thickening with wall thickness measuring 4.1 mm. Mild edema. Murphy's sign is negative. Common bile duct: Diameter: 6 mm, normal Liver: No focal lesion identified. Within normal limits in parenchymal echogenicity. Portal vein is patent on color Doppler imaging with normal direction of blood flow towards the liver. Other: None. IMPRESSION: Cholelithiasis with gallbladder wall thickening and edema. This may indicate cholecystitis in the appropriate clinical setting although the Murphy's sign is negative. Electronically Signed   By: Lucienne Capers M.D.   On: 06/22/2021 22:01    Anti-infectives: Anti-infectives (From admission, onward)    Start     Dose/Rate Route Frequency Ordered Stop   06/23/21 1345  [MAR Hold]  cefTRIAXone (ROCEPHIN) 2 g in sodium chloride 0.9 % 100 mL IVPB        (MAR Hold since Sun 06/24/2021 at 0901.Hold Reason: Transfer to a Procedural area)   2 g 200 mL/hr over 30 Minutes Intravenous Every 24 hours 06/23/21 1254         Assessment/Plan: Choledocholithiasis ESRD DM CHF HTN  [name] for clears today NPO p MN for possible lap chole tomorrow with Dr. Barry Dienes Cont' abx Echo pending   LOS: 1 day    [name] [name] 06/24/2021

## 2021-06-24 NOTE — Progress Notes (Signed)
[facility] Progress Note   Subjective:   Patient seen and examined in room.  Plan for ERCP this AM.  He is hoping to be discharged prior to his next dialysis treatment as he does not like how long you have to wait for transportation. No other complaints.  Denies CP, SOB, abdominal pain, n/v/d.    Objective Vitals:   06/24/21 0908 06/24/21 1121 06/24/21 1130 06/24/21 1140  BP: (!) 150/66 (!) 160/80 (!) 157/78 (!) 156/82  Pulse: 61 (!) 58 (!) 59 (!) 58  Resp: 20 15 18 14   Temp: 98.6 F (37 C) 98 F (36.7 C)    TempSrc: Oral Oral    SpO2: 100% 100% 99% 100%  Weight:      Height:       Physical Exam General:well appearing male in NAD Heart:RRR, no mrg Lungs:CTAB, nml WOB on RA Abdomen:soft, NTND Extremities:no LE edema Dialysis Access: LU AVF +b/t   Filed Weights   06/22/21 1735 06/23/21 1320 06/24/21 0904  Weight: 88.5 kg 86 kg 86 kg    Intake/Output Summary (Last 24 hours) at 06/24/2021 1202 Last data filed at 06/24/2021 1116 Gross per 24 hour  Intake 620 ml  Output 1478 ml  Net -858 ml    Additional Objective Labs: Basic Metabolic Panel: Recent Labs  Lab 06/22/21 2021 06/24/21 0625  NA 137 132*  K 4.4 3.9  CL 97* 95*  CO2 30 26  GLUCOSE 85 338*  BUN 44* 31*  CREATININE 6.75* 5.96*  CALCIUM 8.2* 8.0*   Liver Function Tests: Recent Labs  Lab 06/22/21 2021 06/23/21 0824 06/24/21 0625  AST 431* 332* 176*  ALT 547* 510* 381*  ALKPHOS 229* 209* 200*  BILITOT 5.0* 4.0* 1.4*  PROT 7.4 6.9 6.6  ALBUMIN 3.6 3.2* 3.0*   Recent Labs  Lab 06/22/21 2021  LIPASE 37   CBC: Recent Labs  Lab 06/22/21 2021 06/24/21 0625  WBC 6.9 5.9  NEUTROABS 4.8  --   HGB 9.7* 9.6*  HCT 30.5* 31.0*  MCV 100.0 101.0*  PLT 204 175   Blood Culture    Component Value Date/Time   SDES BLOOD RIGHT ARM 02/06/2019 2050   SPECREQUEST  02/06/2019 2050    BOTTLES DRAWN AEROBIC AND ANAEROBIC Blood Culture results may not be optimal due to an inadequate volume  of blood received in culture bottles Performed at [facility], [address] [place], [place] [address]    CULT NO GROWTH 5 DAYS 02/06/2019 2050   REPTSTATUS 02/12/2019 FINAL 02/06/2019 2050    Cardiac Enzymes: Recent Labs  Lab 06/23/21 0824  CKTOTAL 105   CBG: Recent Labs  Lab 06/23/21 2132 06/24/21 0551 06/24/21 0801 06/24/21 0948 06/24/21 1120  GLUCAP 153* 312* 250* 191* 148*   Iron Studies:  Recent Labs    06/23/21 0824  IRON 38*  TIBC 200*   Lab Results  Component Value Date   INR 1.1 06/23/2021   INR 1.06 09/10/2018   INR 1.22 04/09/2012   Studies/Results: DG ERCP  Result Date: 06/24/2021 CLINICAL DATA:  Cholecystitis with choledocholithiasis EXAM: ERCP TECHNIQUE: Multiple spot images obtained with the fluoroscopic device and submitted for interpretation post-procedure. FLUOROSCOPY TIME:  Fluoroscopy Time:  7 minutes 15 seconds Radiation Exposure Index (if provided by the fluoroscopic device): 87.3 mGy Number of Acquired Spot Images: 0 COMPARISON:  MRCP 06/23/2021 FINDINGS: Two intraoperative saved images demonstrate a flexible duodenal scope in the descending duodenum. A small plastic stent is present in  the pancreatic duct. The images then demonstrate sphincterotomy and balloon sweeping of the common bile duct. IMPRESSION: 1. A ERCP with sphincterotomy and balloon sweeping of the common duct. 2. Plastic pancreatic stent in place. These images were submitted for radiologic interpretation only. Please see the procedural report for the amount of contrast and the fluoroscopy time utilized. Electronically Signed   By: Jacqulynn Cadet M.D.   On: 06/24/2021 11:54   MR ABDOMEN MRCP W WO CONTAST  Result Date: 06/23/2021 CLINICAL DATA:  Jaundice. EXAM: MRI ABDOMEN WITHOUT AND WITH CONTRAST (INCLUDING MRCP) TECHNIQUE: Multiplanar multisequence MR imaging of the abdomen was performed both before and after the administration of intravenous contrast. Heavily  T2-weighted images of the biliary and pancreatic ducts were obtained, and three-dimensional MRCP images were rendered by post processing. CONTRAST:  20mL GADAVIST GADOBUTROL 1 MMOL/ML IV SOLN COMPARISON:  Noncontrast CT on 02/06/2019 FINDINGS: Lower chest: No acute findings. Hepatobiliary: No hepatic masses identified. Diffusely decreased T2 signal intensity of hepatic parenchyma is consistent with increased iron deposition. Numerous tiny gallstones are seen nearly filling the gallbladder. Diffuse gallbladder wall thickening is seen as well as mild pericholecystic edema, consistent with acute cholecystitis. No evidence of biliary ductal dilatation with common bile duct measuring 5 mm in diameter. However, 2 tiny calculi are seen in the distal common bile duct, largest measuring 5 mm. Pancreas: No mass or inflammatory changes. No evidence of pancreatic ductal dilatation or pancreas divisum. Spleen: Within normal limits in size. No masses identified. Diffuse T2 signal intensity of splenic parenchyma is consistent with increased iron deposition. Adrenals/Urinary Tract: No masses identified. A few tiny sub-cm renal cysts are seen bilaterally. No evidence of hydronephrosis. Stomach/Bowel: Visualized portion unremarkable. Vascular/Lymphatic: No pathologically enlarged lymph nodes identified. No acute vascular findings. Other:  None. Musculoskeletal: No suspicious bone lesions identified. Diffuse T2 hypointensity of the marrow signal seen, consistent with increased iron deposition. IMPRESSION: Findings consistent with acute cholecystitis. Two tiny distal common bile duct calculi, largest measuring 5 mm. No evidence of biliary ductal dilatation. Increased iron deposition within the liver, spleen, and bone marrow, consistent with hemosiderosis. Electronically Signed   By: Marlaine Hind M.D.   On: 06/23/2021 10:52   US Abdomen Limited RUQ (LIVER/GB)  Result Date: 06/22/2021 CLINICAL DATA:  Abdominal pain, nausea, abnormal  labs. EXAM: ULTRASOUND ABDOMEN LIMITED RIGHT UPPER QUADRANT COMPARISON:  CT 01/22/2021 FINDINGS: Gallbladder: Cholelithiasis with multiple small stones filling the gallbladder. Gallbladder wall thickening with wall thickness measuring 4.1 mm. Mild edema. Murphy's sign is negative. Common bile duct: Diameter: 6 mm, normal Liver: No focal lesion identified. Within normal limits in parenchymal echogenicity. Portal vein is patent on color Doppler imaging with normal direction of blood flow towards the liver. Other: None. IMPRESSION: Cholelithiasis with gallbladder wall thickening and edema. This may indicate cholecystitis in the appropriate clinical setting although the Murphy's sign is negative. Electronically Signed   By: Lucienne Capers M.D.   On: 06/22/2021 22:01    Medications:  sodium chloride 10 mL/hr at 06/24/21 0929   cefTRIAXone (ROCEPHIN)  IV 2 g (06/23/21 2206)    aspirin EC  81 mg Oral Daily   calcium carbonate  2,000 mg Oral TID WC   carvedilol  37.5 mg Oral BID WC   Chlorhexidine Gluconate Cloth  6 each Topical Q0600   indomethacin  100 mg Rectal Once   insulin aspart  0-9 Units Subcutaneous Q6H   ivabradine  5 mg Oral BID WC   lanthanum  3,000 mg Oral TID  WC   sacubitril-valsartan  1 tablet Oral BID    Dialysis Orders: TTS - [facility]  4hrs 67min, BFR 400, DFR 500,  EDW 88kg, 2K/ 2.5Ca   Access: LU AVF  Heparin 4000 unit bolus Mircera 50 mcg q2wks - last 6/23 Calcitriol 0.80mcg PO qHD     Assessment/Plan:  Acute cholecystitis - noted on MRI. LFTs elevated. ERCP with CBD stone removal by GI today.  Plan for possible lap chole tomorrow. On ABX.   ESRD -  on HD TTS. Next HD on 7/5.  If planned to be d/c on Tuesday we can ask outpatient center if they have any afternoon spots available.  His normal dialysis is 5:30AM.  Otherwise would need dialysis here. Hx hyperkalemia, last K3.9, monitor labs on [place] as outpatient on non dialysis days.  Hypertension/volume  - BP mostly well  controlled. Continue home meds.  Does not appear volume overloaded.  Under dry weight, has been getting a little under as outpatient.  Possible weight loss, may need to be lowered on d/c.   Anemia of CKD - Hgb 9.6. 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 ESA due 7/7. Follow trends.  Secondary Hyperparathyroidism -  Ca in goal. Will check phos. Continue binders and vdra.   Nutrition - Renal diet w/fluid restrictions when not NPO. CHF - last EF improved to 45% DMT2 - per primary.   Jen Mow, PA-C [facility] 06/24/2021,12:02 PM  LOS: 1 day

## 2021-06-24 NOTE — Progress Notes (Signed)
Fistula Left upper arm--thrill and bruit present.  Held 0800 coreg per MD d/t HR of 59 prior to ERCP.

## 2021-06-24 NOTE — Progress Notes (Signed)
  Echocardiogram 2D Echocardiogram has been performed.  Bobbye Charleston 06/24/2021, 3:59 PM

## 2021-06-24 NOTE — Progress Notes (Signed)
[facility] Progress Note  Patient: [name] [name]    EUM:[id number]  DOA: 06/22/2021     Date of Service: the patient was seen and examined on 06/24/2021  Brief hospital course: Past medical history of ESRD on TTS, HTN, IDDM, HFpEF, anemia of CKD, HLD. Presents with complaints of abdominal pain found to have cholecystitis as well as choledocholithiasis. SP ERCP. Likely will be scheduled for cholecystectomy tomorrow. Currently plan is monitor surgical recommendation.  Subjective: No abdominal pain no nausea no vomiting.  No fever no chills.  Assessment and Plan: 1.  Acute cholecystitis Presents with abdominal pain.  MRCP is concerning for cholecystitis. Currently on IV antibiotics.  Monitor. General surgery consulted.  Likely will require cholecystectomy.  2.  Choledocholithiasis Elevated LFT Abdominal pain has resolved.  No nausea no vomiting. SP ERCP with removal of the stone. No stents placed. Follow-up on labs tomorrow.  3.  ESRD on HD TTS Appreciate oncology consultation. Patient will be on transplant list within a week. Do not think that his presentation with cholecystitis should have any repercussions on his transplant candidacy.  4.  Chronic systolic and diastolic CHF Currently appears euvolemic. Echocardiogram shows EF of 35% suspect this is not a new drop as prior echocardiogram showed 40% EF. Will monitor with continuing current medications with  5.  Type II DM with diabetic neuropathy as well as nephropathy controlled with long-term insulin use Currently on sliding scale insulin.  Monitor.  Hypoglycemic therefore holding long-acting insulin Continue gabapentin  6.  Anemia of chronic disease Hemoglobin stable. Monitor.   Scheduled Meds:  aspirin EC  81 mg Oral Daily   [START ON 06/26/2021] calcitRIOL  0.75 mcg Oral Q T,Th,Sa-HD   calcium carbonate  2,000 mg Oral TID WC   carvedilol  37.5 mg Oral BID WC   Chlorhexidine Gluconate Cloth  6 each  Topical Q0600   indomethacin  100 mg Rectal Once   insulin aspart  0-9 Units Subcutaneous Q6H   ivabradine  5 mg Oral BID WC   lanthanum  3,000 mg Oral TID WC   sacubitril-valsartan  1 tablet Oral BID   Continuous Infusions:  sodium chloride 10 mL/hr at 06/24/21 0929   cefTRIAXone (ROCEPHIN)  IV 2 g (06/24/21 1434)   PRN Meds: acetaminophen **OR** acetaminophen, gabapentin, HYDROmorphone (DILAUDID) injection, menthol-cetylpyridinium  Body mass index is 21.91 kg/m.        DVT Prophylaxis: SCD, pharmacological prophylaxis contraindicated due to procedure tomorrow       Advance goals of care discussion: Pt is Full code.  Family Communication: family was present at bedside, at the time of interview.  The pt provided permission to discuss medical plan with the family. Opportunity was given to ask question and all questions were answered satisfactorily.   Data Reviewed: I have personally reviewed and interpreted daily labs, tele strips, imaging. LFT improving.  AST from 431-176.  Bilirubin 5.0-1.4.   Physical Exam:  General: Appear in mild distress, no Rash; Oral Mucosa Clear, moist. no Abnormal Neck Mass Or lumps, Conjunctiva normal  Cardiovascular: S1 and S2 Present, aortic systolic  Murmur, Respiratory: good respiratory effort, Bilateral Air entry present and CTA, no Crackles, no wheezes Abdomen: Bowel Sound present, Soft and no tenderness Extremities: no Pedal edema Neurology: alert and oriented to time, place, and person affect appropriate. no new focal deficit Gait not checked due to patient safety concerns  Vitals:   06/24/21 1130 06/24/21 1140 06/24/21 1214 06/24/21 1835  BP: (!) 157/78 (!) 156/82 Marland Kitchen)  153/88 (!) 143/63  Pulse: (!) 59 (!) 58 (!) 56 62  Resp: 18 14 18    Temp:   97.7 F (36.5 C)   TempSrc:   Oral   SpO2: 99% 100% 100%   Weight:      Height:        Disposition:  Status is: Inpatient  Remains inpatient appropriate because:Ongoing diagnostic  testing needed not appropriate for outpatient work up  Dispo: The patient is from: Home              Anticipated d/c is to: Home              Patient currently is not medically stable to d/c.   Difficult to place patient No  Time spent: 35 minutes. I reviewed all nursing notes, pharmacy notes, vitals, pertinent old records. I have discussed plan of care as described above with RN.  Author: Berle Mull, MD [facility] 06/24/2021 7:46 PM  To reach On-call, see care teams to locate the attending and reach out via [email or web address]. Between 7PM-7AM, please contact night-coverage If you still have difficulty reaching the attending provider, please page the [facility] (Director on Call) for [facility] on amion for assistance.

## 2021-06-24 NOTE — Anesthesia Procedure Notes (Signed)
Procedure Name: Intubation Date/Time: 06/24/2021 10:09 AM Performed by: Eligha Bridegroom, CRNA Pre-anesthesia Checklist: Patient identified, Emergency Drugs available, Suction available, Patient being monitored and Timeout performed Patient Re-evaluated:Patient Re-evaluated prior to induction Oxygen Delivery Method: Circle system utilized Preoxygenation: Pre-oxygenation with 100% oxygen Induction Type: IV induction Ventilation: Mask ventilation without difficulty Laryngoscope Size: Mac and 4 Grade View: Grade II Tube type: Oral Tube size: 7.5 mm Number of attempts: 1 Airway Equipment and Method: Stylet Placement Confirmation: ETT inserted through vocal cords under direct vision, positive ETCO2 and breath sounds checked- equal and bilateral Secured at: 23 cm Tube secured with: Tape Dental Injury: Teeth and Oropharynx as per pre-operative assessment

## 2021-06-24 NOTE — Anesthesia Postprocedure Evaluation (Signed)
Anesthesia Post Note  Patient: [name] [name]  Procedure(s) Performed: ENDOSCOPIC RETROGRADE CHOLANGIOPANCREATOGRAPHY (ERCP) SPHINCTEROTOMY STENT REMOVAL PANCREATIC STENT PLACEMENT REMOVAL OF STONES     Patient location during evaluation: PACU Anesthesia Type: General Level of consciousness: awake and alert Pain management: pain level controlled Vital Signs Assessment: post-procedure vital signs reviewed and stable Respiratory status: spontaneous breathing, nonlabored ventilation, respiratory function stable and patient connected to nasal cannula oxygen Cardiovascular status: blood pressure returned to baseline and stable Postop Assessment: no apparent nausea or vomiting Anesthetic complications: no   No notable events documented.  Last Vitals:  Vitals:   06/24/21 1140 06/24/21 1214  BP: (!) 156/82 (!) 153/88  Pulse: (!) 58 (!) 56  Resp: 14 18  Temp:  36.5 C  SpO2: 100% 100%    Last Pain:  Vitals:   06/24/21 1214  TempSrc: Oral  PainSc:                  March Rummage Cordell Coke

## 2021-06-24 NOTE — Op Note (Signed)
[facility] Patient Name: [name] [name] Procedure Date : 06/24/2021 MRN: [id number] Attending MD: Clarene Essex , MD Date of Birth: [birth year] CSN: [id number] Age: 55 Admit Type: Inpatient Procedure:                ERCP Indications:              For therapy of bile duct stone(s) Providers:                Clarene Essex, MD, Kary Kos RN, RN, Benetta Spar, Technician Referring MD:              Medicines:                General Anesthesia Complications:            No immediate complications. Estimated Blood Loss:     Estimated blood loss: none. Procedure:                Pre-Anesthesia Assessment:                           - Prior to the procedure, a History and Physical                            was performed, and patient medications and                            allergies were reviewed. The patient's tolerance of                            previous anesthesia was also reviewed. The risks                            and benefits of the procedure and the sedation                            options and risks were discussed with the patient.                            All questions were answered, and informed consent                            was obtained. Prior Anticoagulants: The patient has                            taken no previous anticoagulant or antiplatelet                            agents except for aspirin. ASA Grade Assessment:                            III - A patient with severe systemic disease. After  reviewing the risks and benefits, the patient was                            deemed in satisfactory condition to undergo the                            procedure.                           After obtaining informed consent, the scope was                            passed under direct vision. Throughout the                            procedure, the patient's blood pressure, pulse, and                             oxygen saturations were monitored continuously. The                            TJF- Q180V ([id number]) Olympus duodenoscope was                            introduced through the mouth, and used to inject                            contrast into and used to locate the major papilla.                            The ERCP was somewhat difficult due to challenging                            cannulation. Successful completion of the procedure                            was aided by performing the maneuvers documented                            (below) in this report. The patient tolerated the                            procedure well. Scope In: Scope Out: Findings:      The major papilla was normal. Unfortunately the wire went towards the       pancreas a few times despite repositioning and we elected to place one 4       Fr by 3 cm pancreatic stent with a 3/4 external pigtail and no internal       flaps was placed 2 cm into the ventral pancreatic duct. The stent was in       good position. To obtain deep selective cannulation required switching       to the smaller sphincterotome loaded with the smaller wire and deep       selective cannulation was then readily obtained and an  obvious stone was       seen on initial cholangiogram and we proceeded with a biliary       sphincterotomy was made with a Hydratome sphincterotome using ERBE       electrocautery. There was no post-sphincterotomy bleeding. We extended       the sphincterotomy until we had adequate biliary drainage and could get       the fully bowed sphincterotome easily in and out of the duct and       choledocholithiasis was found in a nondilated duct. The biliary tree was       swept with an adjustable 9- 12 mm balloon starting at the bifurcation.       We only use the 12 mm size which passed readily through the pain       sphincterotomy site and sludge was swept from the duct. All stones were       removed. Nothing was found at  the end of the procedure on occlusion       cholangiogram done in the customary fashion. And multiple balloon       pull-through's were done as well without residual stones or debris and       and pulling the stone out of the duct with the balloon the stent fell       out into the duodenum and one stent was removed from Duodenum using a       regular forceps. Impression:               - The major papilla appeared normal.                           - Choledocholithiasis was found. Complete removal                            was accomplished by biliary sphincterotomy and                            balloon extraction.                           - One pancreatic stent was placed into the ventral                            pancreatic duct.                           - A biliary sphincterotomy was performed.                           - The biliary tree was swept and nothing was found                            at the the end of the procedure after the stone was                            removed.                           - One stent was removed from Duodenum which  had                            fallen out from the pancreatic duct as above. Recommendation:           - Clear liquid diet today.                           - Continue present medications.                           - Return to GI clinic PRN.                           - Telephone GI clinic if symptomatic PRN.                           - Refer to a surgeon today. I have discussed the                            case with them and they will probably take his                            gallbladder out tomorrow unless a delayed                            complication                           - Check liver enzymes (AST, ALT, alkaline                            phosphatase, bilirubin) and hemogram with white                            blood cell count and platelets in the morning. Procedure Code(s):        --- Professional ---                            [id number], Esophagogastroduodenoscopy, flexible,                            transoral; with removal of foreign body(s) Diagnosis Code(s):        --- Professional ---                           K80.50, Calculus of bile duct without cholangitis                            or cholecystitis without obstruction                           Z46.59, Encounter for fitting and adjustment of                            other gastrointestinal appliance and device CPT copyright 2019  American [employer]. All rights reserved. The codes documented in this report are preliminary and upon coder review may  be revised to meet current compliance requirements. Clarene Essex, MD 06/24/2021 11:24:49 AM This report has been signed electronically. Number of Addenda: 0

## 2021-06-24 NOTE — Anesthesia Preprocedure Evaluation (Addendum)
Anesthesia Evaluation  Patient identified by MRN, date of birth, ID band Patient awake    Reviewed: Allergy & Precautions, NPO status , Patient's Chart, lab work & pertinent test results  Airway Mallampati: II  TM Distance: >3 FB Neck ROM: Full    Dental  (+) Teeth Intact   Pulmonary neg pulmonary ROS,    Pulmonary exam normal        Cardiovascular hypertension, Pt. on medications and Pt. on home beta blockers +CHF   Rhythm:Regular Rate:Normal     Neuro/Psych negative neurological ROS  negative psych ROS   GI/Hepatic Neg liver ROS, choledocholithiasis   Endo/Other  diabetes, Poorly Controlled, Type 2, Insulin Dependent  Renal/GU ESRFRenal disease  negative genitourinary   Musculoskeletal negative musculoskeletal ROS (+)   Abdominal (+)  Abdomen: soft. Bowel sounds: normal.  Peds  Hematology  (+) anemia ,   Anesthesia Other Findings   Reproductive/Obstetrics                           Anesthesia Physical Anesthesia Plan  ASA: 3  Anesthesia Plan: General   Post-op Pain Management:    Induction: Intravenous  PONV Risk Score and Plan: 2 and Ondansetron, Dexamethasone, Treatment may vary due to age or medical condition and Midazolam  Airway Management Planned: Mask and Oral ETT  Additional Equipment: None  Intra-op Plan:   Post-operative Plan: Extubation in OR  Informed Consent: I have reviewed the patients History and Physical, chart, labs and discussed the procedure including the risks, benefits and alternatives for the proposed anesthesia with the patient or authorized representative who has indicated his/her understanding and acceptance.     Dental advisory given  Plan Discussed with: CRNA  Anesthesia Plan Comments: (ECHO 2019: - Left ventricle: The cavity size was at the upper limits of  normal. Wall thickness was increased in a pattern of mild LVH.  Systolic  function was mildly to moderately reduced. The estimated  ejection fraction was in the range of 40% to 45%. Diffuse  hypokinesis. Doppler parameters are consistent with abnormal left  ventricular relaxation (grade 1 diastolic dysfunction).  - Aortic valve: Trileaflet; mildly calcified leaflets.  - Mitral valve: Calcified annulus. Mildly calcified leaflets .  - Left atrium: The atrium was moderately dilated.  - Inferior vena cava: The vessel was normal in size. The  respirophasic diameter changes were in the normal range (= 50%),  consistent with normal central venous pressure.  - Pericardium, extracardiac: Pericardial effusion seen. Small to  moderate in size, moreso adjacent to right atrium. There is some  right atrial inversion but on frank right-sided chamber collapse.  Lab Results      Component                Value               Date                      WBC                      5.9                 06/24/2021                HGB                      9.6 (L)  06/24/2021                HCT                      31.0 (L)            06/24/2021                MCV                      101.0 (H)           06/24/2021                PLT                      175                 06/24/2021           Lab Results      Component                Value               Date                      NA                       132 (L)             06/24/2021                K                        3.9                 06/24/2021                CO2                      26                  06/24/2021                GLUCOSE                  338 (H)             06/24/2021                BUN                      31 (H)              06/24/2021                CREATININE               5.96 (H)            06/24/2021                CALCIUM                  8.0 (L)             06/24/2021                GFRNONAA  11 (L)              06/24/2021                GFRAA                    12  (L)              02/06/2019          )       Anesthesia Quick Evaluation

## 2021-06-24 NOTE — Transfer of Care (Signed)
Immediate Anesthesia Transfer of Care Note  Patient: [name] [name]  Procedure(s) Performed: ENDOSCOPIC RETROGRADE CHOLANGIOPANCREATOGRAPHY (ERCP) SPHINCTEROTOMY STENT REMOVAL PANCREATIC STENT PLACEMENT REMOVAL OF STONES  Patient Location: PACU  Anesthesia Type:General  Level of Consciousness: awake, alert  and oriented  Airway & Oxygen Therapy: Patient Spontanous Breathing and Patient connected to nasal cannula oxygen  Post-op Assessment: Report given to RN and Post -op Vital signs reviewed and stable  Post vital signs: Reviewed and stable  Last Vitals:  Vitals Value Taken Time  BP 204/73 06/24/21 1119  Temp    Pulse 58 06/24/21 1120  Resp 15 06/24/21 1120  SpO2 100 % 06/24/21 1120  Vitals shown include unvalidated device data.  Last Pain:  Vitals:   06/24/21 0908  TempSrc: Oral  PainSc:          Complications: No notable events documented.

## 2021-06-24 NOTE — Progress Notes (Signed)
Day of Surgery   Subjective/Chief Complaint: S/p ERCP today  CBD stone extracted    Objective: Vital signs in last 24 hours: Temp:  [98 F (36.7 C)-98.7 F (37.1 C)] 98 F (36.7 C) (07/03 1121) Pulse Rate:  [58-72] 58 (07/03 1140) Resp:  [12-20] 14 (07/03 1140) BP: (85-160)/(7-82) 156/82 (07/03 1140) SpO2:  [99 %-100 %] 100 % (07/03 1140) Weight:  [86 kg] 86 kg (07/03 0904) Last BM Date: 06/23/21  Intake/Output from previous day: 07/02 0701 - 07/03 0700 In: 120 [P.O.:120] Out: 1603 [Urine:125] Intake/Output this shift: Total I/O In: 500 [I.V.:500] Out: -  PE:  Constitutional: No acute distress, conversant, appears states age. Eyes: Anicteric sclerae, moist conjunctiva, no lid lag Lungs: Clear to auscultation bilaterally, normal respiratory effort CV: regular rate and rhythm, no murmurs, no peripheral edema, pedal pulses 2+ GI: Soft, no masses or hepatosplenomegaly, non-tender to palpation Skin: No rashes, palpation reveals normal turgor Psychiatric: appropriate judgment and insight, oriented to person, place, and time    Lab Results:  Recent Labs    06/22/21 2021 06/24/21 0625  WBC 6.9 5.9  HGB 9.7* 9.6*  HCT 30.5* 31.0*  PLT 204 175   BMET Recent Labs    06/22/21 2021 06/24/21 0625  NA 137 132*  K 4.4 3.9  CL 97* 95*  CO2 30 26  GLUCOSE 85 338*  BUN 44* 31*  CREATININE 6.75* 5.96*  CALCIUM 8.2* 8.0*   PT/INR Recent Labs    06/23/21 0824  LABPROT 13.8  INR 1.1   ABG No results for input(s): PHART, HCO3 in the last 72 hours.  Invalid input(s): PCO2, PO2  Studies/Results: MR ABDOMEN MRCP W WO CONTAST  Result Date: 06/23/2021 CLINICAL DATA:  Jaundice. EXAM: MRI ABDOMEN WITHOUT AND WITH CONTRAST (INCLUDING MRCP) TECHNIQUE: Multiplanar multisequence MR imaging of the abdomen was performed both before and after the administration of intravenous contrast. Heavily T2-weighted images of the biliary and pancreatic ducts were obtained, and  three-dimensional MRCP images were rendered by post processing. CONTRAST:  53mL GADAVIST GADOBUTROL 1 MMOL/ML IV SOLN COMPARISON:  Noncontrast CT on 02/06/2019 FINDINGS: Lower chest: No acute findings. Hepatobiliary: No hepatic masses identified. Diffusely decreased T2 signal intensity of hepatic parenchyma is consistent with increased iron deposition. Numerous tiny gallstones are seen nearly filling the gallbladder. Diffuse gallbladder wall thickening is seen as well as mild pericholecystic edema, consistent with acute cholecystitis. No evidence of biliary ductal dilatation with common bile duct measuring 5 mm in diameter. However, 2 tiny calculi are seen in the distal common bile duct, largest measuring 5 mm. Pancreas: No mass or inflammatory changes. No evidence of pancreatic ductal dilatation or pancreas divisum. Spleen: Within normal limits in size. No masses identified. Diffuse T2 signal intensity of splenic parenchyma is consistent with increased iron deposition. Adrenals/Urinary Tract: No masses identified. A few tiny sub-cm renal cysts are seen bilaterally. No evidence of hydronephrosis. Stomach/Bowel: Visualized portion unremarkable. Vascular/Lymphatic: No pathologically enlarged lymph nodes identified. No acute vascular findings. Other:  None. Musculoskeletal: No suspicious bone lesions identified. Diffuse T2 hypointensity of the marrow signal seen, consistent with increased iron deposition. IMPRESSION: Findings consistent with acute cholecystitis. Two tiny distal common bile duct calculi, largest measuring 5 mm. No evidence of biliary ductal dilatation. Increased iron deposition within the liver, spleen, and bone marrow, consistent with hemosiderosis. Electronically Signed   By: Marlaine Hind M.D.   On: 06/23/2021 10:52   US Abdomen Limited RUQ (LIVER/GB)  Result Date: 06/22/2021 CLINICAL DATA:  Abdominal pain,  nausea, abnormal labs. EXAM: ULTRASOUND ABDOMEN LIMITED RIGHT UPPER QUADRANT COMPARISON:  CT  01/22/2021 FINDINGS: Gallbladder: Cholelithiasis with multiple small stones filling the gallbladder. Gallbladder wall thickening with wall thickness measuring 4.1 mm. Mild edema. Murphy's sign is negative. Common bile duct: Diameter: 6 mm, normal Liver: No focal lesion identified. Within normal limits in parenchymal echogenicity. Portal vein is patent on color Doppler imaging with normal direction of blood flow towards the liver. Other: None. IMPRESSION: Cholelithiasis with gallbladder wall thickening and edema. This may indicate cholecystitis in the appropriate clinical setting although the Murphy's sign is negative. Electronically Signed   By: Lucienne Capers M.D.   On: 06/22/2021 22:01    Anti-infectives: Anti-infectives (From admission, onward)    Start     Dose/Rate Route Frequency Ordered Stop   06/23/21 1345  [MAR Hold]  cefTRIAXone (ROCEPHIN) 2 g in sodium chloride 0.9 % 100 mL IVPB        (MAR Hold since Sun 06/24/2021 at 0901.Hold Reason: Transfer to a Procedural area)   2 g 200 mL/hr over 30 Minutes Intravenous Every 24 hours 06/23/21 1254         Assessment/Plan: Choledocholithiasis ESRD DM CHF HTN  OK for clears today NPO p MN for possible lap chole tomorrow with Dr. Barry Dienes Cont' abx Echo pending   LOS: 1 day    Ralene Ok 06/24/2021

## 2021-06-24 NOTE — Progress Notes (Signed)
Coral Spikes [place] 9:51 AM  Subjective: Patient without any complaints we rediscussed his procedure and answered all of his questions and case discussed with the other GI team and the ER physician the other night  Objective: Vital signs stable afebrile no acute distress exam please see preassessment evaluation LFTs decreased white count okay MRCP reviewed agree probable CBD stones assessment: Gallstones and probable CBD stones  Plan: The risk benefits methods and success rate of ERCP and stone extraction was rediscussed with the patient and will proceed with anesthesia assistance today with further work-up and plans pending those findings  [facility] E  office [phone] After 5PM or if no answer call [phone]

## 2021-06-25 ENCOUNTER — Encounter (HOSPITAL_COMMUNITY): Payer: Self-pay | Admitting: Gastroenterology

## 2021-06-25 ENCOUNTER — Inpatient Hospital Stay (HOSPITAL_COMMUNITY): Payer: Medicare Other | Admitting: Certified Registered Nurse Anesthetist

## 2021-06-25 ENCOUNTER — Encounter (HOSPITAL_COMMUNITY)
Admission: EM | Disposition: A | Discharge status: Home / Self Care | Payer: Self-pay | Source: Home / Self Care | Attending: Internal Medicine

## 2021-06-25 HISTORY — PX: CHOLECYSTECTOMY: SHX55

## 2021-06-25 LAB — CBC WITH DIFFERENTIAL/PLATELET
Abs Immature Granulocytes: 0.04 10*3/uL (ref 0.00–0.07)
Basophils Absolute: 0.1 10*3/uL (ref 0.0–0.1)
Basophils Relative: 1 %
Eosinophils Absolute: 0.5 10*3/uL (ref 0.0–0.5)
Eosinophils Relative: 9 %
HCT: 31.1 % — ABNORMAL LOW (ref 39.0–52.0)
Hemoglobin: 9.9 g/dL — ABNORMAL LOW (ref 13.0–17.0)
Immature Granulocytes: 1 %
Lymphocytes Relative: 22 %
Lymphs Abs: 1.4 10*3/uL (ref 0.7–4.0)
MCH: 31.9 pg (ref 26.0–34.0)
MCHC: 31.8 g/dL (ref 30.0–36.0)
MCV: 100.3 fL — ABNORMAL HIGH (ref 80.0–100.0)
Monocytes Absolute: 0.5 10*3/uL (ref 0.1–1.0)
Monocytes Relative: 8 %
Neutro Abs: 3.7 10*3/uL (ref 1.7–7.7)
Neutrophils Relative %: 59 %
Platelets: 174 10*3/uL (ref 150–400)
RBC: 3.1 MIL/uL — ABNORMAL LOW (ref 4.22–5.81)
RDW: 14.3 % (ref 11.5–15.5)
WBC: 6.3 10*3/uL (ref 4.0–10.5)
nRBC: 0 % (ref 0.0–0.2)

## 2021-06-25 LAB — COMPREHENSIVE METABOLIC PANEL
ALT: 279 U/L — ABNORMAL HIGH (ref 0–44)
AST: 80 U/L — ABNORMAL HIGH (ref 15–41)
Albumin: 3.1 g/dL — ABNORMAL LOW (ref 3.5–5.0)
Alkaline Phosphatase: 185 U/L — ABNORMAL HIGH (ref 38–126)
Anion gap: 12 (ref 5–15)
BUN: 40 mg/dL — ABNORMAL HIGH (ref 6–20)
CO2: 27 mmol/L (ref 22–32)
Calcium: 8.4 mg/dL — ABNORMAL LOW (ref 8.9–10.3)
Chloride: 99 mmol/L (ref 98–111)
Creatinine, Ser: 7.86 mg/dL — ABNORMAL HIGH (ref 0.61–1.24)
GFR, Estimated: 8 mL/min — ABNORMAL LOW (ref >60)
Glucose, Bld: 89 mg/dL (ref 70–99)
Potassium: 4.3 mmol/L (ref 3.5–5.1)
Sodium: 138 mmol/L (ref 135–145)
Total Bilirubin: 1.3 mg/dL — ABNORMAL HIGH (ref 0.3–1.2)
Total Protein: 6.6 g/dL (ref 6.5–8.1)

## 2021-06-25 LAB — GLUCOSE, CAPILLARY
Glucose-Capillary: 114 mg/dL — ABNORMAL HIGH (ref 70–99)
Glucose-Capillary: 125 mg/dL — ABNORMAL HIGH (ref 70–99)
Glucose-Capillary: 154 mg/dL — ABNORMAL HIGH (ref 70–99)
Glucose-Capillary: 325 mg/dL — ABNORMAL HIGH (ref 70–99)
Glucose-Capillary: 89 mg/dL (ref 70–99)
Glucose-Capillary: 93 mg/dL (ref 70–99)

## 2021-06-25 SURGERY — LAPAROSCOPIC CHOLECYSTECTOMY
Anesthesia: General

## 2021-06-25 MED ORDER — FENTANYL CITRATE (PF) 250 MCG/5ML IJ SOLN
INTRAMUSCULAR | Status: DC | PRN
  Administered 2021-06-25 (×2): 50 ug via INTRAVENOUS
  Administered 2021-06-25: 150 ug via INTRAVENOUS

## 2021-06-25 MED ORDER — PROPOFOL 10 MG/ML IV BOLUS
INTRAVENOUS | Status: DC | PRN
  Administered 2021-06-25: 50 mg via INTRAVENOUS
  Administered 2021-06-25: 150 mg via INTRAVENOUS

## 2021-06-25 MED ORDER — GLYCOPYRROLATE PF 0.2 MG/ML IJ SOSY
PREFILLED_SYRINGE | INTRAMUSCULAR | Status: AC
  Filled 2021-06-25: qty 1

## 2021-06-25 MED ORDER — MIDAZOLAM HCL 2 MG/2ML IJ SOLN
INTRAMUSCULAR | Status: AC
  Filled 2021-06-25: qty 2

## 2021-06-25 MED ORDER — PHENYLEPHRINE 40 MCG/ML (10ML) SYRINGE FOR IV PUSH (FOR BLOOD PRESSURE SUPPORT)
PREFILLED_SYRINGE | INTRAVENOUS | Status: DC | PRN
  Administered 2021-06-25 (×2): 200 ug via INTRAVENOUS

## 2021-06-25 MED ORDER — PROPOFOL 10 MG/ML IV BOLUS
INTRAVENOUS | Status: AC
  Filled 2021-06-25: qty 20

## 2021-06-25 MED ORDER — FENTANYL CITRATE (PF) 100 MCG/2ML IJ SOLN
INTRAMUSCULAR | Status: AC
  Filled 2021-06-25: qty 2

## 2021-06-25 MED ORDER — PROMETHAZINE HCL 25 MG/ML IJ SOLN: 6.2500 mg | INTRAMUSCULAR | Status: DC | PRN

## 2021-06-25 MED ORDER — EPHEDRINE SULFATE-NACL 50-0.9 MG/10ML-% IV SOSY
PREFILLED_SYRINGE | INTRAVENOUS | Status: DC | PRN
  Administered 2021-06-25: 10 mg via INTRAVENOUS

## 2021-06-25 MED ORDER — ONDANSETRON HCL 4 MG/2ML IJ SOLN
INTRAMUSCULAR | Status: DC | PRN
  Administered 2021-06-25: 4 mg via INTRAVENOUS

## 2021-06-25 MED ORDER — SODIUM CHLORIDE 0.9 % IV SOLN: INTRAVENOUS | Status: DC | PRN

## 2021-06-25 MED ORDER — PROPOFOL 1000 MG/100ML IV EMUL
INTRAVENOUS | Status: AC
  Filled 2021-06-25: qty 100

## 2021-06-25 MED ORDER — LIDOCAINE 2% (20 MG/ML) 5 ML SYRINGE
INTRAMUSCULAR | Status: AC
  Filled 2021-06-25: qty 5

## 2021-06-25 MED ORDER — ROCURONIUM BROMIDE 10 MG/ML (PF) SYRINGE
PREFILLED_SYRINGE | INTRAVENOUS | Status: AC
  Filled 2021-06-25: qty 10

## 2021-06-25 MED ORDER — ACETAMINOPHEN 10 MG/ML IV SOLN
1000.0000 mg | Freq: Once | INTRAVENOUS | Status: DC | PRN
  Administered 2021-06-25: 1000 mg via INTRAVENOUS

## 2021-06-25 MED ORDER — PHENOL 1.4 % MT LIQD
1.0000 | OROMUCOSAL | Status: DC | PRN
  Administered 2021-06-25: 1 via OROMUCOSAL
  Filled 2021-06-25: qty 177

## 2021-06-25 MED ORDER — LIDOCAINE HCL 1 % IJ SOLN
INTRAMUSCULAR | Status: DC | PRN
  Administered 2021-06-25: 20 mL via SUBCUTANEOUS

## 2021-06-25 MED ORDER — FENTANYL CITRATE (PF) 250 MCG/5ML IJ SOLN
INTRAMUSCULAR | Status: AC
  Filled 2021-06-25: qty 5

## 2021-06-25 MED ORDER — BUPIVACAINE-EPINEPHRINE (PF) 0.25% -1:200000 IJ SOLN
INTRAMUSCULAR | Status: AC
  Filled 2021-06-25: qty 30

## 2021-06-25 MED ORDER — ALBUMIN HUMAN 5 % IV SOLN: INTRAVENOUS | Status: DC | PRN

## 2021-06-25 MED ORDER — EPHEDRINE 5 MG/ML INJ
INTRAVENOUS | Status: AC
  Filled 2021-06-25: qty 10

## 2021-06-25 MED ORDER — DEXAMETHASONE SODIUM PHOSPHATE 10 MG/ML IJ SOLN
INTRAMUSCULAR | Status: DC | PRN
  Administered 2021-06-25: 5 mg via INTRAVENOUS

## 2021-06-25 MED ORDER — PHENYLEPHRINE HCL-NACL 10-0.9 MG/250ML-% IV SOLN
INTRAVENOUS | Status: DC | PRN
  Administered 2021-06-25: 50 ug/min via INTRAVENOUS

## 2021-06-25 MED ORDER — PHENYLEPHRINE 40 MCG/ML (10ML) SYRINGE FOR IV PUSH (FOR BLOOD PRESSURE SUPPORT)
PREFILLED_SYRINGE | INTRAVENOUS | Status: AC
  Filled 2021-06-25: qty 10

## 2021-06-25 MED ORDER — SUGAMMADEX SODIUM 200 MG/2ML IV SOLN
INTRAVENOUS | Status: DC | PRN
  Administered 2021-06-25: 200 mg via INTRAVENOUS

## 2021-06-25 MED ORDER — FENTANYL CITRATE (PF) 100 MCG/2ML IJ SOLN
25.0000 ug | INTRAMUSCULAR | Status: DC | PRN
  Administered 2021-06-25: 50 ug via INTRAVENOUS

## 2021-06-25 MED ORDER — MIDAZOLAM HCL 2 MG/2ML IJ SOLN
INTRAMUSCULAR | Status: DC | PRN
  Administered 2021-06-25: 2 mg via INTRAVENOUS

## 2021-06-25 MED ORDER — 0.9 % SODIUM CHLORIDE (POUR BTL) OPTIME
TOPICAL | Status: DC | PRN
  Administered 2021-06-25: 1000 mL

## 2021-06-25 MED ORDER — LIDOCAINE 2% (20 MG/ML) 5 ML SYRINGE
INTRAMUSCULAR | Status: DC | PRN
  Administered 2021-06-25: 100 mg via INTRAVENOUS

## 2021-06-25 MED ORDER — ONDANSETRON HCL 4 MG/2ML IJ SOLN
INTRAMUSCULAR | Status: AC
  Filled 2021-06-25: qty 2

## 2021-06-25 MED ORDER — LIDOCAINE HCL 1 % IJ SOLN
INTRAMUSCULAR | Status: AC
  Filled 2021-06-25: qty 20

## 2021-06-25 MED ORDER — GLYCOPYRROLATE PF 0.2 MG/ML IJ SOSY
PREFILLED_SYRINGE | INTRAMUSCULAR | Status: DC | PRN
  Administered 2021-06-25: .2 mg via INTRAVENOUS

## 2021-06-25 MED ORDER — OXYCODONE HCL 5 MG PO TABS
5.0000 mg | ORAL_TABLET | ORAL | Status: DC | PRN
Start: 2021-06-25 — End: 2021-06-26
  Administered 2021-06-25 – 2021-06-26 (×3): 10 mg via ORAL
  Filled 2021-06-25 (×2): qty 2
  Filled 2021-06-25: qty 1

## 2021-06-25 MED ORDER — ROCURONIUM BROMIDE 10 MG/ML (PF) SYRINGE
PREFILLED_SYRINGE | INTRAVENOUS | Status: DC | PRN
  Administered 2021-06-25: 50 mg via INTRAVENOUS

## 2021-06-25 MED ORDER — SODIUM CHLORIDE 0.9 % IR SOLN
Status: DC | PRN
  Administered 2021-06-25 (×2): 1000 mL

## 2021-06-25 MED ORDER — ACETAMINOPHEN 10 MG/ML IV SOLN
INTRAVENOUS | Status: AC
  Filled 2021-06-25: qty 100

## 2021-06-25 SURGICAL SUPPLY — 47 items
APPLIER CLIP ROT 10 11.4 M/L (STAPLE) ×2
BAG COUNTER SPONGE SURGICOUNT (BAG) ×2 IMPLANT
BLADE CLIPPER SURG (BLADE) IMPLANT
CANISTER SUCT 3000ML PPV (MISCELLANEOUS) ×2 IMPLANT
CHLORAPREP W/TINT 26 (MISCELLANEOUS) ×2 IMPLANT
CLIP APPLIE ROT 10 11.4 M/L (STAPLE) ×1 IMPLANT
COVER SURGICAL LIGHT HANDLE (MISCELLANEOUS) ×2 IMPLANT
DECANTER SPIKE VIAL GLASS SM (MISCELLANEOUS) ×3 IMPLANT
DERMABOND ADHESIVE PROPEN (GAUZE/BANDAGES/DRESSINGS) ×1
DERMABOND ADVANCED (GAUZE/BANDAGES/DRESSINGS) ×1
DERMABOND ADVANCED .7 DNX12 (GAUZE/BANDAGES/DRESSINGS) ×1 IMPLANT
DERMABOND ADVANCED .7 DNX6 (GAUZE/BANDAGES/DRESSINGS) IMPLANT
DRAPE WARM FLUID 44X44 (DRAPES) ×2 IMPLANT
ELECT REM PT RETURN 9FT ADLT (ELECTROSURGICAL) ×2
ELECTRODE REM PT RTRN 9FT ADLT (ELECTROSURGICAL) ×1 IMPLANT
ENDOLOOP SUT PDS II  0 18 (SUTURE) ×2
ENDOLOOP SUT PDS II 0 18 (SUTURE) IMPLANT
FILTER SMOKE EVAC LAPAROSHD (FILTER) IMPLANT
GLOVE SURG ENC MOIS LTX SZ6 (GLOVE) ×2 IMPLANT
GLOVE SURG UNDER LTX SZ6.5 (GLOVE) ×2 IMPLANT
GOWN STRL REUS W/ TWL LRG LVL3 (GOWN DISPOSABLE) ×2 IMPLANT
GOWN STRL REUS W/TWL 2XL LVL3 (GOWN DISPOSABLE) ×2 IMPLANT
GOWN STRL REUS W/TWL LRG LVL3 (GOWN DISPOSABLE) ×4
KIT BASIN OR (CUSTOM PROCEDURE TRAY) ×2 IMPLANT
KIT TURNOVER KIT B (KITS) ×2 IMPLANT
L-HOOK LAP DISP 36CM (ELECTROSURGICAL) ×2
LHOOK LAP DISP 36CM (ELECTROSURGICAL) ×1 IMPLANT
NS IRRIG 1000ML POUR BTL (IV SOLUTION) ×2 IMPLANT
PAD ARMBOARD 7.5X6 YLW CONV (MISCELLANEOUS) ×2 IMPLANT
PENCIL BUTTON HOLSTER BLD 10FT (ELECTRODE) ×2 IMPLANT
POUCH RETRIEVAL ECOSAC 10 (ENDOMECHANICALS) ×1 IMPLANT
POUCH RETRIEVAL ECOSAC 10MM (ENDOMECHANICALS) ×2
SCISSORS LAP 5X35 DISP (ENDOMECHANICALS) ×2 IMPLANT
SET IRRIG TUBING LAPAROSCOPIC (IRRIGATION / IRRIGATOR) ×2 IMPLANT
SET TUBE SMOKE EVAC HIGH FLOW (TUBING) ×2 IMPLANT
SLEEVE ENDOPATH XCEL 5M (ENDOMECHANICALS) ×2 IMPLANT
SPECIMEN JAR SMALL (MISCELLANEOUS) ×2 IMPLANT
SUT MNCRL AB 4-0 PS2 18 (SUTURE) ×3 IMPLANT
SUT VIC AB 0 UR5 27 (SUTURE) ×1 IMPLANT
TOWEL GREEN STERILE (TOWEL DISPOSABLE) ×2 IMPLANT
TOWEL GREEN STERILE FF (TOWEL DISPOSABLE) ×2 IMPLANT
TRAY LAPAROSCOPIC MC (CUSTOM PROCEDURE TRAY) ×2 IMPLANT
TROCAR XCEL BLUNT TIP 100MML (ENDOMECHANICALS) ×2 IMPLANT
TROCAR XCEL NON-BLD 11X100MML (ENDOMECHANICALS) ×2 IMPLANT
TROCAR XCEL NON-BLD 5MMX100MML (ENDOMECHANICALS) ×2 IMPLANT
WARMER LAPAROSCOPE (MISCELLANEOUS) ×2 IMPLANT
WATER STERILE IRR 1000ML POUR (IV SOLUTION) ×2 IMPLANT

## 2021-06-25 NOTE — Progress Notes (Signed)
Pt back on unit from OR. Groggy but able to answer questions. Back on telemetry. 4 lap sites with skin glue. Pain 5/6 out of 10 in abdomen.

## 2021-06-25 NOTE — Anesthesia Preprocedure Evaluation (Signed)
Anesthesia Evaluation  Patient identified by MRN, date of birth, ID band Patient awake    Reviewed: Allergy & Precautions, NPO status , Patient's Chart, lab work & pertinent test results  Airway Mallampati: II  TM Distance: >3 FB     Dental   Pulmonary shortness of breath, pneumonia,    breath sounds clear to auscultation       Cardiovascular hypertension, +CHF   Rhythm:Regular Rate:Normal     Neuro/Psych  Neuromuscular disease    GI/Hepatic Neg liver ROS, History noted Dr. Nyoka Cowden   Endo/Other  diabetes  Renal/GU Dialysis and ESRFRenal disease     Musculoskeletal   Abdominal   Peds  Hematology  (+) anemia ,   Anesthesia Other Findings   Reproductive/Obstetrics                            Anesthesia Physical Anesthesia Plan  ASA: 3  Anesthesia Plan: General   Post-op Pain Management:    Induction:   PONV Risk Score and Plan: 3 and Ondansetron, Dexamethasone and Midazolam  Airway Management Planned: Oral ETT  Additional Equipment:   Intra-op Plan:   Post-operative Plan: Extubation in OR  Informed Consent: I have reviewed the patients History and Physical, chart, labs and discussed the procedure including the risks, benefits and alternatives for the proposed anesthesia with the patient or authorized representative who has indicated his/her understanding and acceptance.     Dental advisory given  Plan Discussed with: CRNA and Anesthesiologist  Anesthesia Plan Comments:         Anesthesia Quick Evaluation

## 2021-06-25 NOTE — Progress Notes (Addendum)
Nephrology Update Note  Missed patient today as he was in the OR. Will plan on HD tomorrow morning as he is unlikely to leave prior to scheduled HD time.  [name] [name]

## 2021-06-25 NOTE — Progress Notes (Signed)
[name] [name] 9:08 AM  Subjective: Patient doing well awaiting his surgery without obvious post ERCP problems and he has no new complaints and I wished him well with his surgery today and his transplant in the future  Objective: Vital signs stable afebrile no acute distress abdomen is soft nontender white count okay LFTs decreased  Assessment: Status post ERCP  Plan: Follow liver tests back to normal as an outpatient please call me if I can be of any further assistance with this hospital stay otherwise happy to see back as an outpatient if other GI questions or problems are needed in the future  [facility] E  office [phone] After 5PM or if no answer call [phone]

## 2021-06-25 NOTE — Progress Notes (Signed)
Pt tolerated renal diet well for a late lunch and dinner. Passing a "tiny" amount of gas. Pt ambulated in the hallway x3 for > 10 minutes each time.  Pain is 3-4 out of 10, and he stated he did not need anything for pain. Had once dose of dilaudid upon returning from surgery. Talked with pt about staying ahead of the pain when it's lower to prevent it from getting to a 9 or 10.  Pt wants to know if he is having dialysis here or if he is to go to his clinic tomorrow--provided he is d/c.  Will; need to be d/c early if he is to go to his clinic.

## 2021-06-25 NOTE — Progress Notes (Signed)
Called OR to give report.

## 2021-06-25 NOTE — Progress Notes (Addendum)
[facility] PROGRESS NOTE  Patient: [name] [name] UCJ:[id number]   PCP: [name], PA-C DOB: 11/03/1966   DOA: 06/22/2021   DOS: 06/25/2021    Subjective: Seen after laparoscopic cholecystectomy.  Reports abdominal pain.  No nausea no vomiting.  Fatigue and has some sore throat.  Objective:  Vitals:   06/25/21 1231 06/25/21 1242  BP: 138/73 (!) 151/74  Pulse: 61 60  Resp: 17 15  Temp: 97.9 F (36.6 C) 97.6 F (36.4 C)  SpO2: 97% 100%    S1-S2 present. Clear to auscultation. Bowel sound present.   Assessment and plan: Cholecystitis. Will continue to antibiotic. Underwent lap chole.  Had some spillage of the stone.  After exploration no residual stones were seen.  ESRD on HD Patient's request is to be seen earlier tomorrow so that he can continue with his hemodialysis at his outpatient center. Given the timing of the procedure as well as the type of the procedure of I do not think that that will be a feasible option but we will evaluate the patient in the morning and discussed with general regarding stability.  Author: Berle Mull, MD [facility] 06/25/2021 5:56 PM   If 7PM-7AM, please contact night-coverage at [email or web address]

## 2021-06-25 NOTE — Interval H&P Note (Signed)
History and Physical Interval Note:  06/25/2021 8:59 AM  [name] [name]  has presented today for surgery, with the diagnosis of Choledocholithiasis. He is s/p ERCP yesterday with extraction of stones.  The various methods of treatment have been discussed with the patient and family. After consideration of risks, benefits and other options for treatment, the patient has consented to  Procedure(s): LAPAROSCOPIC CHOLECYSTECTOMY (N/A) as a surgical intervention.  The patient's history has been reviewed, patient examined, no change in status, stable for surgery.  I have reviewed the patient's chart and labs.  Questions were answered to the patient's satisfaction.     Stark Klein

## 2021-06-25 NOTE — Anesthesia Procedure Notes (Signed)
Procedure Name: Intubation Date/Time: 06/25/2021 9:23 AM Performed by: Belinda Block, MD Pre-anesthesia Checklist: Patient identified, Emergency Drugs available, Suction available and Patient being monitored Patient Re-evaluated:Patient Re-evaluated prior to induction Oxygen Delivery Method: Circle System Utilized Preoxygenation: Pre-oxygenation with 100% oxygen Induction Type: IV induction Ventilation: Mask ventilation without difficulty, Oral airway inserted - appropriate to patient size and Two handed mask ventilation required Laryngoscope Size: Glidescope and 4 Grade View: Grade I Tube type: Oral Tube size: 7.5 mm Number of attempts: 1 Airway Equipment and Method: Stylet, Oral airway and Video-laryngoscopy Placement Confirmation: ETT inserted through vocal cords under direct vision, positive ETCO2 and breath sounds checked- equal and bilateral Secured at: 22 cm Tube secured with: Tape Dental Injury: Teeth and Oropharynx as per pre-operative assessment  Comments: Elective glide use after DVL attempts x2 by CRNA and MDA with Miller 2 and 3.  No view of cords, unable to easily pass ETT so transitioned to Glide with no issues of placement.

## 2021-06-25 NOTE — Anesthesia Postprocedure Evaluation (Signed)
Anesthesia Post Note  Patient: [name] [name]  Procedure(s) Performed: LAPAROSCOPIC CHOLECYSTECTOMY     Patient location during evaluation: PACU Anesthesia Type: General Level of consciousness: awake Pain management: pain level controlled Vital Signs Assessment: post-procedure vital signs reviewed and stable Respiratory status: spontaneous breathing Cardiovascular status: stable Postop Assessment: no apparent nausea or vomiting Anesthetic complications: no   No notable events documented.  Last Vitals:  Vitals:   06/25/21 1231 06/25/21 1242  BP: 138/73 (!) 151/74  Pulse: 61 60  Resp: 17 15  Temp: 36.6 C 36.4 C  SpO2: 97% 100%    Last Pain:  Vitals:   06/25/21 1254  TempSrc:   PainSc: 6                  Jaklyn Alen

## 2021-06-25 NOTE — Transfer of Care (Signed)
Immediate Anesthesia Transfer of Care Note  Patient: [name] [name]  Procedure(s) Performed: LAPAROSCOPIC CHOLECYSTECTOMY  Patient Location: PACU  Anesthesia Type:General  Level of Consciousness: awake, alert  and oriented  Airway & Oxygen Therapy: Patient Spontanous Breathing and Patient connected to face mask oxygen  Post-op Assessment: Report given to RN and Post -op Vital signs reviewed and stable  Post vital signs: Reviewed and stable  Last Vitals:  Vitals Value Taken Time  BP 141/94 06/25/21 1245  Temp 36.4 C 06/25/21 1242  Pulse 60 06/25/21 1242  Resp 29 06/25/21 1247  SpO2 100 % 06/25/21 1242  Vitals shown include unvalidated device data.  Last Pain:  Vitals:   06/25/21 1242  TempSrc: Oral  PainSc:       Patients Stated Pain Goal: 4 (64/38/37 7939)  Complications: No notable events documented.

## 2021-06-25 NOTE — Op Note (Signed)
Laparoscopic Cholecystectomy   Indications: This patient presents with choledocholithiasis and will undergo laparoscopic cholecystectomy.  Pre-operative Diagnosis: choledocholithiasis  Post-operative Diagnosis: Same + acute cholecystitis  Surgeon: Stark Klein   Anesthesia: General endotracheal anesthesia and local  ASA Class: 3  Procedure Details  The patient was seen again in the Holding Room. The risks, benefits, complications, treatment options, and expected outcomes were discussed with the patient. The possibilities of  bleeding, recurrent infection, damage to nearby structures, the need for additional procedures, failure to diagnose a condition, the possible need to convert to an open procedure, and creating a complication requiring transfusion or operation were discussed with the patient. The likelihood of improving the patient's symptoms with return to their baseline status is good.    The patient and/or family concurred with the proposed plan, giving informed consent. The site of surgery properly noted. The patient was taken to Operating Room, and the procedure verified as Laparoscopic Cholecystectomy. A Time Out was held and the above information confirmed.  Prior to the induction of general anesthesia, antibiotic prophylaxis was administered. General endotracheal anesthesia was then administered and tolerated well. After the induction, the abdomen was prepped with Chloraprep and draped in the sterile fashion. The patient was positioned in the supine position.  Local anesthetic agent was injected into the skin near the umbilicus and a vertical infraumbilical incision was made with a #11 blade. I dissected down to the abdominal fascia with blunt dissection.  The fascia was incised vertically and we entered the peritoneal cavity bluntly.  A pursestring suture of 0-Vicryl was placed around the fascial opening.  The Hasson cannula was inserted and secured with the stay suture.   Pneumoperitoneum was then created with CO2 and tolerated well without any adverse changes in the patient's vital signs. An 11-mm port was placed in the subxiphoid position.  Two 5-mm ports were placed in the right upper quadrant. All skin incisions were infiltrated with a local anesthetic agent before making the incision and placing the trocars.   We positioned the patient in reverse Trendelenburg, tilted slightly to the patient's left.  The gallbladder was identified, the fundus grasped and retracted cephalad. Adhesions were lysed bluntly and with the electrocautery where indicated, taking care not to injure any adjacent organs or viscus. The infundibulum was grasped and retracted laterally, exposing the peritoneum overlying the triangle of Calot. This was then divided and exposed in a blunt fashion. A critical view of the cystic duct and cystic artery was obtained.  The cystic duct was clearly identified and bluntly dissected circumferentially. The cystic duct was ligated with a clip distally.   The cystic duct was then ligated with clips, but the clips did not quite go across the duct.  An endoloop was obtained.  The cystic duct was then divided, and the endoloop was used to secure the base of the cystic duct below the clips.  The cystic artery was identified, dissected free, ligated with clips and divided as well.   The gallbladder was dissected from the liver bed in retrograde fashion with the electrocautery. The gallbladder was placed in an Endocatch bag.  The endocatch bag was tight, so the fascial incision was enlarged.  Despite this, the bag broke and stones spilled right below the periumbilical port.  The large suction irrigator and the stone grasper were used to retrieve many of the stones.  The incision was then just explored without pneumoperitoneum and several larger stones were retrieved this way.  No residual stones were seen.  The small bowel loops in the pelvis were also manipulated and the  mesentery was examined.    The liver bed was irrigated and inspected. Hemostasis was achieved with the electrocautery. Copious irrigation was utilized and was repeatedly aspirated until clear.   Pneumoperitoneum was released as we removed the trocars.   The pursestring suture was used to close the umbilical fascia.  Additional 0-0 vicryl interrupted sutures were used to close down the defect.  4-0 Monocryl was used to close the skin.   The skin was cleaned and dry, and Dermabond was applied.   The patient was then extubated and brought to the recovery room in stable condition. Instrument, sponge, and needle counts were correct at closure and at the conclusion of the case.   Findings: Acute cholecystitis. Stone spillage, retrieved until none were seen.  .    Estimated Blood Loss: min         Drains: none          Specimens: Gallbladder to pathology       Complications: None; patient tolerated the procedure well.         Disposition: PACU - hemodynamically stable.         Condition: stable

## 2021-06-25 NOTE — Progress Notes (Signed)
Pt off unit to surgery. Administered coreg prior to leaving. CHG complete.

## 2021-06-26 ENCOUNTER — Encounter (HOSPITAL_COMMUNITY): Payer: Self-pay | Admitting: General Surgery

## 2021-06-26 LAB — HEPATIC FUNCTION PANEL
ALT: 201 U/L — ABNORMAL HIGH (ref 0–44)
AST: 52 U/L — ABNORMAL HIGH (ref 15–41)
Albumin: 3.2 g/dL — ABNORMAL LOW (ref 3.5–5.0)
Alkaline Phosphatase: 146 U/L — ABNORMAL HIGH (ref 38–126)
Bilirubin, Direct: 0.4 mg/dL — ABNORMAL HIGH (ref 0.0–0.2)
Indirect Bilirubin: 0.8 mg/dL (ref 0.3–0.9)
Total Bilirubin: 1.2 mg/dL (ref 0.3–1.2)
Total Protein: 6.8 g/dL (ref 6.5–8.1)

## 2021-06-26 LAB — RENAL FUNCTION PANEL
Albumin: 3.2 g/dL — ABNORMAL LOW (ref 3.5–5.0)
Anion gap: 12 (ref 5–15)
BUN: 64 mg/dL — ABNORMAL HIGH (ref 6–20)
CO2: 24 mmol/L (ref 22–32)
Calcium: 8 mg/dL — ABNORMAL LOW (ref 8.9–10.3)
Chloride: 96 mmol/L — ABNORMAL LOW (ref 98–111)
Creatinine, Ser: 9.67 mg/dL — ABNORMAL HIGH (ref 0.61–1.24)
GFR, Estimated: 6 mL/min — ABNORMAL LOW (ref >60)
Glucose, Bld: 224 mg/dL — ABNORMAL HIGH (ref 70–99)
Phosphorus: 5.8 mg/dL — ABNORMAL HIGH (ref 2.5–4.6)
Potassium: 4.5 mmol/L (ref 3.5–5.1)
Sodium: 132 mmol/L — ABNORMAL LOW (ref 135–145)

## 2021-06-26 LAB — CBC
HCT: 28.9 % — ABNORMAL LOW (ref 39.0–52.0)
Hemoglobin: 9.3 g/dL — ABNORMAL LOW (ref 13.0–17.0)
MCH: 32.1 pg (ref 26.0–34.0)
MCHC: 32.2 g/dL (ref 30.0–36.0)
MCV: 99.7 fL (ref 80.0–100.0)
Platelets: 172 10*3/uL (ref 150–400)
RBC: 2.9 MIL/uL — ABNORMAL LOW (ref 4.22–5.81)
RDW: 14.1 % (ref 11.5–15.5)
WBC: 12.2 10*3/uL — ABNORMAL HIGH (ref 4.0–10.5)
nRBC: 0 % (ref 0.0–0.2)

## 2021-06-26 LAB — GLUCOSE, CAPILLARY
Glucose-Capillary: 222 mg/dL — ABNORMAL HIGH (ref 70–99)
Glucose-Capillary: 218 mg/dL — ABNORMAL HIGH (ref 70–99)

## 2021-06-26 MED ORDER — SODIUM CHLORIDE 0.9 % IV SOLN: 100.0000 mL | INTRAVENOUS | Status: DC | PRN

## 2021-06-26 MED ORDER — ALTEPLASE 2 MG IJ SOLR: 2.0000 mg | Freq: Once | INTRAMUSCULAR | Status: DC | PRN

## 2021-06-26 MED ORDER — METHOCARBAMOL 500 MG PO TABS: 500.0000 mg | ORAL_TABLET | Freq: Four times a day (QID) | ORAL | 0 refills | Status: AC | PRN

## 2021-06-26 MED ORDER — LIDOCAINE HCL (PF) 1 % IJ SOLN: 5.0000 mL | INTRAMUSCULAR | Status: DC | PRN

## 2021-06-26 MED ORDER — OXYCODONE HCL 5 MG PO TABS
ORAL_TABLET | ORAL | Status: AC
  Filled 2021-06-26: qty 2

## 2021-06-26 MED ORDER — OXYCODONE HCL 5 MG PO TABS: 5.0000 mg | ORAL_TABLET | ORAL | 0 refills | Status: AC | PRN

## 2021-06-26 MED ORDER — LIDOCAINE-PRILOCAINE 2.5-2.5 % EX CREA
1.0000 "application " | TOPICAL_CREAM | CUTANEOUS | Status: DC | PRN
  Filled 2021-06-26: qty 5

## 2021-06-26 MED ORDER — MENTHOL 3 MG MT LOZG: 1.0000 | LOZENGE | OROMUCOSAL | 0 refills | Status: AC | PRN

## 2021-06-26 MED ORDER — ACETAMINOPHEN 325 MG PO TABS: 650.0000 mg | ORAL_TABLET | Freq: Four times a day (QID) | ORAL | 0 refills | Status: AC | PRN

## 2021-06-26 MED ORDER — HEPARIN SODIUM (PORCINE) 1000 UNIT/ML DIALYSIS
1000.0000 [IU] | INTRAMUSCULAR | Status: DC | PRN
  Filled 2021-06-26: qty 1

## 2021-06-26 MED ORDER — PENTAFLUOROPROP-TETRAFLUOROETH EX AERO: 1.0000 "application " | INHALATION_SPRAY | CUTANEOUS | Status: DC | PRN

## 2021-06-26 NOTE — Progress Notes (Signed)
Inpatient Diabetes Program Recommendations  AACE/ADA: New Consensus Statement on Inpatient Glycemic Control (2015)  Target Ranges:  Prepandial:   less than 140 mg/dL      Peak postprandial:   less than 180 mg/dL (1-2 hours)      Critically ill patients:  140 - 180 mg/dL   Lab Results  Component Value Date   GLUCAP 222 (H) 06/26/2021   HGBA1C 7.7 (H) 06/23/2021    Review of Glycemic Control Results for TAKUMA, CIFELLI (MRN [id number]) as of 06/26/2021 10:10  Ref. Range 06/25/2021 23:33 06/26/2021 06:38  Glucose-Capillary Latest Ref Range: 70 - 99 mg/dL 325 (H) 222 (H)   Diabetes history: DM2 Outpatient Diabetes medications: Tresiba 38 units daily, Novolog 5-7 with BF and lunch, 10-14 with supper Current orders for Inpatient glycemic control: Novolog 0-9 units Q6H  Inpatient Diabetes Program Recommendations:     If remains inpatient please consider adding 50% of home basal insulin-Lantus 20 units daily.  Will continue to follow while inpatient.  Thank you, Reche Dixon, RN, BSN Diabetes Coordinator Inpatient Diabetes Program [phone] (team pager from 8a-5p)

## 2021-06-26 NOTE — Progress Notes (Signed)
Progress Note  1 Day Post-Op  Subjective: CC: seen in HD. Feeling welling this am. He has abdominal pain with movement but well controlled at rest. He has eaten without nausea or emesis. He has been ambulating.   Objective: Vital signs in last 24 hours: Temp:  [97.2 F (36.2 C)-98.4 F (36.9 C)] 97.8 F (36.6 C) (07/05 0700) Pulse Rate:  [59-70] 65 (07/05 1000) Resp:  [10-18] 12 (07/05 0800) BP: (116-153)/(58-77) 141/66 (07/05 1000) SpO2:  [97 %-100 %] 100 % (07/05 0700) Weight:  [89.1 kg] 89.1 kg (07/05 0700) Last BM Date: 06/23/21  Intake/Output from previous day: 07/04 0701 - 07/05 0700 In: 1000.2 [P.O.:250; I.V.:300; IV Piggyback:450.2] Out: 10 [Blood:10] Intake/Output this shift: No intake/output data recorded.  PE: General: pleasant, WD, male who is sitting up in chair in NAD HEENT: head is normocephalic, atraumatic.  Mouth is pink and moist Heart: regular, rate, and rhythm.  Palpable radial pulses bilaterally Lungs: Respiratory effort nonlabored Abd: soft,ND, +BS, appropriate post op TTP worst on right. Incisions with glue intact. No erythema or discharge MS: all 4 extremities are symmetrical with no cyanosis, clubbing, or edema. Skin: warm and dry with no masses, lesions, or rashes Psych: A&Ox3 with an appropriate affect.    Lab Results:  Recent Labs    06/25/21 0608 06/26/21 0628  WBC 6.3 12.2*  HGB 9.9* 9.3*  HCT 31.1* 28.9*  PLT 174 172   BMET Recent Labs    06/25/21 0608 06/26/21 0628  NA 138 132*  K 4.3 4.5  CL 99 96*  CO2 27 24  GLUCOSE 89 224*  BUN 40* 64*  CREATININE 7.86* 9.67*  CALCIUM 8.4* 8.0*   PT/INR No results for input(s): LABPROT, INR in the last 72 hours. CMP     Component Value Date/Time   NA 132 (L) 06/26/2021 0628   NA 132 (L) 01/13/2017 1200   K 4.5 06/26/2021 0628   CL 96 (L) 06/26/2021 0628   CO2 24 06/26/2021 0628   GLUCOSE 224 (H) 06/26/2021 0628   BUN 64 (H) 06/26/2021 0628   BUN 63 (H) 01/13/2017 1200    CREATININE 9.67 (H) 06/26/2021 0628   CREATININE 1.40 (H) 04/23/2012 1433   CALCIUM 8.0 (L) 06/26/2021 0628   CALCIUM 6.5 03/06/2018 0907   PROT 6.6 06/25/2021 0608   PROT 6.6 01/13/2017 1200   ALBUMIN 3.2 (L) 06/26/2021 0628   ALBUMIN 3.4 (L) 01/13/2017 1200   AST 80 (H) 06/25/2021 0608   ALT 279 (H) 06/25/2021 0608   ALKPHOS 185 (H) 06/25/2021 0608   BILITOT 1.3 (H) 06/25/2021 0608   BILITOT 0.2 01/13/2017 1200   GFRNONAA 6 (L) 06/26/2021 0628   GFRNONAA 60 04/23/2012 1433   GFRAA 12 (L) 02/06/2019 2054   GFRAA 70 04/23/2012 1433   Lipase     Component Value Date/Time   LIPASE 37 06/22/2021 2021       Studies/Results: DG ERCP  Result Date: 06/24/2021 CLINICAL DATA:  Cholecystitis with choledocholithiasis EXAM: ERCP TECHNIQUE: Multiple spot images obtained with the fluoroscopic device and submitted for interpretation post-procedure. FLUOROSCOPY TIME:  Fluoroscopy Time:  7 minutes 15 seconds Radiation Exposure Index (if provided by the fluoroscopic device): 87.3 mGy Number of Acquired Spot Images: 0 COMPARISON:  MRCP 06/23/2021 FINDINGS: Two intraoperative saved images demonstrate a flexible duodenal scope in the descending duodenum. A small plastic stent is present in the pancreatic duct. The images then demonstrate sphincterotomy and balloon sweeping of the common bile duct. IMPRESSION: 1. A  ERCP with sphincterotomy and balloon sweeping of the common duct. 2. Plastic pancreatic stent in place. These images were submitted for radiologic interpretation only. Please see the procedural report for the amount of contrast and the fluoroscopy time utilized. Electronically Signed   By: Jacqulynn Cadet M.D.   On: 06/24/2021 11:54   ECHOCARDIOGRAM COMPLETE  Result Date: 06/24/2021    ECHOCARDIOGRAM REPORT   Patient Name:   [name] [name] Date of Exam: 06/24/2021 Medical Rec #:  [id number]             Height:       78.0 in Accession #:    [id number]            Weight:       189.6 lb Date  of Birth:  [birth year]             BSA:          2.207 m Patient Age:    55 years              BP:           153/88 mmHg Patient Gender: M                     HR:           59 bpm. Exam Location:  Inpatient Procedure: 2D Echo, 3D Echo, Cardiac Doppler and Color Doppler Indications:    I50.40* Unspecified combined systolic (congestive) and diastolic                 (congestive) heart failure  History:        Patient has prior history of Echocardiogram examinations, most                 recent 05/23/2018. CHF and Cardiomyopathy, Abnormal ECG,                 Signs/Symptoms:Bacteremia, Shortness of Breath, Dyspnea and                 Dizziness/Lightheadedness; Risk Factors:Hypertension and                 Diabetes. ESRD. Pericardial effusion.  Sonographer:    Roseanna Rainbow RDCS Referring Phys: [id number] Secor  Sonographer Comments: Technically difficult study due to poor echo windows. IMPRESSIONS  1. Left ventricular ejection fraction, by estimation, is 30 to 35%. The left ventricle has moderately decreased function. The left ventricle demonstrates global hypokinesis. The left ventricular internal cavity size was moderately dilated. There is moderate left ventricular hypertrophy. Left ventricular diastolic parameters are consistent with Grade I diastolic dysfunction (impaired relaxation). Elevated left ventricular end-diastolic pressure.  2. Right ventricular systolic function is mildly reduced. The right ventricular size is normal.  3. The pericardial effusion is circumferential. There is no evidence of cardiac tamponade.  4. The mitral valve is abnormal. Mild mitral valve regurgitation.  5. The aortic valve is tricuspid. Aortic valve regurgitation is not visualized. Mild aortic valve sclerosis is present, with no evidence of aortic valve stenosis.  6. Aortic dilatation noted. There is mild dilatation of the ascending aorta, measuring 39 mm.  7. The inferior vena cava is normal in size with greater than 50%  respiratory variability, suggesting right atrial pressure of 3 mmHg. Comparison(s): Changes from prior study are noted. 05/23/2018: LVEF 40-45%. FINDINGS  Left Ventricle: Left ventricular ejection fraction, by estimation, is 30 to 35%. The left ventricle has moderately decreased function. The left ventricle  demonstrates global hypokinesis. The left ventricular internal cavity size was moderately dilated. There is moderate left ventricular hypertrophy. Left ventricular diastolic parameters are consistent with Grade I diastolic dysfunction (impaired relaxation). Elevated left ventricular end-diastolic pressure. Right Ventricle: The right ventricular size is normal. No increase in right ventricular wall thickness. Right ventricular systolic function is mildly reduced. Left Atrium: Left atrial size was normal in size. Right Atrium: Right atrial size was normal in size. Pericardium: Trivial pericardial effusion is present. The pericardial effusion is circumferential. There is no evidence of cardiac tamponade. Mitral Valve: The mitral valve is abnormal. There is moderate thickening of the mitral valve leaflet(s). Mild mitral valve regurgitation. Tricuspid Valve: The tricuspid valve is grossly normal. Tricuspid valve regurgitation is trivial. Aortic Valve: The aortic valve is tricuspid. Aortic valve regurgitation is not visualized. Mild aortic valve sclerosis is present, with no evidence of aortic valve stenosis. Pulmonic Valve: The pulmonic valve was grossly normal. Pulmonic valve regurgitation is mild. Aorta: Aortic dilatation noted. There is mild dilatation of the ascending aorta, measuring 39 mm. Venous: The inferior vena cava is normal in size with greater than 50% respiratory variability, suggesting right atrial pressure of 3 mmHg. IAS/Shunts: No atrial level shunt detected by color flow Doppler.  LEFT VENTRICLE PLAX 2D LVIDd:         6.30 cm      Diastology LVIDs:         5.10 cm      LV e' medial:    3.48 cm/s LV PW:          1.70 cm      LV E/e' medial:  23.5 LV IVS:        1.10 cm      LV e' lateral:   8.16 cm/s LVOT diam:     2.30 cm      LV E/e' lateral: 10.0 LV SV:         107 LV SV Index:   49 LVOT Area:     4.15 cm  LV Volumes (MOD) LV vol d, MOD A2C: 200.0 ml LV vol d, MOD A4C: 282.0 ml LV vol s, MOD A2C: 135.0 ml LV vol s, MOD A4C: 188.0 ml LV SV MOD A2C:     65.0 ml LV SV MOD A4C:     282.0 ml LV SV MOD BP:      71.0 ml RIGHT VENTRICLE            IVC RV S prime:     8.92 cm/s  IVC diam: 1.30 cm TAPSE (M-mode): 2.2 cm LEFT ATRIUM             Index       RIGHT ATRIUM           Index LA diam:        4.40 cm 1.99 cm/m  RA Area:     17.40 cm LA Vol (A2C):   68.8 ml 31.18 ml/m RA Volume:   47.30 ml  21.43 ml/m LA Vol (A4C):   32.3 ml 14.64 ml/m LA Biplane Vol: 51.6 ml 23.38 ml/m  AORTIC VALVE LVOT Vmax:   116.00 cm/s LVOT Vmean:  74.900 cm/s LVOT VTI:    0.258 m  AORTA Ao Root diam: 3.80 cm Ao Asc diam:  3.85 cm MITRAL VALVE MV Area (PHT): 2.95 cm    SHUNTS MV Decel Time: 257 msec    Systemic VTI:  0.26 m MV E velocity: 81.80 cm/s  Systemic Diam: 2.30 cm  MV A velocity: 96.90 cm/s MV E/A ratio:  0.84 Lyman Bishop MD Electronically signed by Lyman Bishop MD Signature Date/Time: 06/24/2021/5:11:50 PM    Final     Anti-infectives: Anti-infectives (From admission, onward)    Start     Dose/Rate Route Frequency Ordered Stop   06/23/21 1345  cefTRIAXone (ROCEPHIN) 2 g in sodium chloride 0.9 % 100 mL IVPB        2 g 200 mL/hr over 30 Minutes Intravenous Every 24 hours 06/23/21 1254          Assessment/Plan  Choledocholithiasis  POD1 lap chole - 7/4 FB  - s/p ERCP 7/3 - LFTs pending   FEN: renal ID: [email or web address]  Per [facility] ESRD on HD DM CHF HTN  Await LFTs but otherwise stable for discharge from surgical perspective. I will arrange follow up and send pain medications   LOS: 3 days    Winferd Humphrey, [facility] 06/26/2021, 10:26 AM Please see Amion for pager number during day  hours 7:00am-4:30pm

## 2021-06-26 NOTE — Progress Notes (Signed)
Pt to dialysis.

## 2021-06-26 NOTE — Progress Notes (Signed)
Discharge paperwork provided to patient. Patient and his wife were educated; PIV in [facility] was discontinued. Wife is taking him home shortly.

## 2021-06-26 NOTE — Discharge Instructions (Signed)
[facility] ______[facility] LAPAROSCOPIC SURGERY: POST OP INSTRUCTIONS Always review your discharge instruction sheet given to you by the facility where your surgery was performed. IF YOU HAVE DISABILITY OR FAMILY LEAVE FORMS, YOU MUST BRING THEM TO THE OFFICE FOR PROCESSING.   DO NOT GIVE THEM TO YOUR DOCTOR.  A prescription for pain medication may be given to you upon discharge.  Take your pain medication as prescribed, if needed.  If narcotic pain medicine is not needed, then you may take acetaminophen (Tylenol) or ibuprofen (Advil) as needed. Take your usually prescribed medications unless otherwise directed. If you need a refill on your pain medication, please contact your pharmacy.  They will contact our office to request authorization. Prescriptions will not be filled after 5pm or on week-ends. You should follow a light diet the first few days after arrival home, such as soup and crackers, etc.  Be sure to include lots of fluids daily. Most patients will experience some swelling and bruising in the area of the incisions.  Ice packs will help.  Swelling and bruising can take several days to resolve.  It is common to experience some constipation if taking pain medication after surgery.  Increasing fluid intake and taking a stool softener (such as Colace) will usually help or prevent this problem from occurring.  A mild laxative (Milk of Magnesia or Miralax) should be taken according to package instructions if there are no bowel movements after 48 hours. Unless discharge instructions indicate otherwise, you may remove your bandages 24-48 hours after surgery, and you may shower at that time.  You may have steri-strips (small skin tapes) in place directly over the incision.  These strips should be left on the skin for 7-10 days.  If your surgeon used skin glue on the incision, you may shower in 24 hours.  The glue will flake off over the next 2-3 weeks.  Any sutures or staples will be  removed at the office during your follow-up visit. ACTIVITIES:  You may resume regular (light) daily activities beginning the next day--such as daily self-care, walking, climbing stairs--gradually increasing activities as tolerated.  You may have sexual intercourse when it is comfortable.  Refrain from any heavy lifting or straining until approved by your doctor. You may drive when you are no longer taking prescription pain medication, you can comfortably wear a seatbelt, and you can safely maneuver your car and apply brakes. RETURN TO WORK:  __________________________________________________________ You should see your doctor in the office for a follow-up appointment approximately 2-3 weeks after your surgery.  Make sure that you call for this appointment within a day or two after you arrive home to insure a convenient appointment time. OTHER INSTRUCTIONS: __________________________________________________________________________________________________________________________ __________________________________________________________________________________________________________________________ WHEN TO CALL YOUR DOCTOR: Fever over 101.0 Inability to urinate Continued bleeding from incision. Increased pain, redness, or drainage from the incision. Increasing abdominal pain  The clinic staff is available to answer your questions during regular business hours.  Please don't hesitate to call and ask to speak to one of the nurses for clinical concerns.  If you have a medical emergency, go to the nearest emergency room or call 911.  A surgeon from [facility] is always on call at the hospital. [address], [address], [place], [place]  [address] ? [address] [address], [place],    [address] [phone] ? [phone] ? FAX [phone] Web site: [email or web address]  

## 2021-06-26 NOTE — Progress Notes (Signed)
[name] Progress Note   Subjective:   Patient feels well today with some abdominal soreness but minimal complaints otherwise.  Passing some gas.  Ready to leave the hospital  Objective Vitals:   06/26/21 0830 06/26/21 0856 06/26/21 0900 06/26/21 0929  BP: 128/65 127/65 123/61 (!) 125/58  Pulse: (!) 59 (!) 59 (!) 59 60  Resp:      Temp:      TempSrc:      SpO2:      Weight:      Height:       Physical Exam General:well appearing male in NAD Heart:RRR, no mrg Lungs:CTAB, nml WOB on RA Abdomen:soft, NTND Extremities:no LE edema Dialysis Access: LU AVF +b/t   Filed Weights   06/23/21 1320 06/24/21 0904 06/26/21 0700  Weight: 86 kg 86 kg 89.1 kg    Intake/Output Summary (Last 24 hours) at 06/26/2021 0949 Last data filed at 06/25/2021 1400 Gross per 24 hour  Intake 1000.16 ml  Output 10 ml  Net 990.16 ml    Additional Objective Labs: Basic Metabolic Panel: Recent Labs  Lab 06/24/21 0625 06/24/21 0647 06/25/21 0608 06/26/21 0628  NA 132*  --  138 132*  K 3.9  --  4.3 4.5  CL 95*  --  99 96*  CO2 26  --  27 24  GLUCOSE 338*  --  89 224*  BUN 31*  --  40* 64*  CREATININE 5.96*  --  7.86* 9.67*  CALCIUM 8.0*  --  8.4* 8.0*  PHOS  --  4.3  --  5.8*   Liver Function Tests: Recent Labs  Lab 06/23/21 0824 06/24/21 0625 06/25/21 0608 06/26/21 0628  AST 332* 176* 80*  --   ALT 510* 381* 279*  --   ALKPHOS 209* 200* 185*  --   BILITOT 4.0* 1.4* 1.3*  --   PROT 6.9 6.6 6.6  --   ALBUMIN 3.2* 3.0* 3.1* 3.2*   Recent Labs  Lab 06/22/21 2021  LIPASE 37   CBC: Recent Labs  Lab 06/22/21 2021 06/24/21 0625 06/25/21 0608 06/26/21 0628  WBC 6.9 5.9 6.3 12.2*  NEUTROABS 4.8  --  3.7  --   HGB 9.7* 9.6* 9.9* 9.3*  HCT 30.5* 31.0* 31.1* 28.9*  MCV 100.0 101.0* 100.3* 99.7  PLT 204 175 174 172   Blood Culture    Component Value Date/Time   SDES BLOOD RIGHT ARM 02/06/2019 2050   SPECREQUEST  02/06/2019 2050    BOTTLES DRAWN AEROBIC AND  ANAEROBIC Blood Culture results may not be optimal due to an inadequate volume of blood received in culture bottles Performed at [facility], [address] [place], [place] [address]    CULT NO GROWTH 5 DAYS 02/06/2019 2050   REPTSTATUS 02/12/2019 FINAL 02/06/2019 2050    Cardiac Enzymes: Recent Labs  Lab 06/23/21 0824  CKTOTAL 105   CBG: Recent Labs  Lab 06/25/21 1149 06/25/21 1240 06/25/21 1600 06/25/21 2333 06/26/21 0638  GLUCAP 114* 125* 154* 325* 222*   Iron Studies:  No results for input(s): IRON, TIBC, TRANSFERRIN, FERRITIN in the last 72 hours.  Lab Results  Component Value Date   INR 1.1 06/23/2021   INR 1.06 09/10/2018   INR 1.22 04/09/2012   Studies/Results: DG ERCP  Result Date: 06/24/2021 CLINICAL DATA:  Cholecystitis with choledocholithiasis EXAM: ERCP TECHNIQUE: Multiple spot images obtained with the fluoroscopic device and submitted for interpretation post-procedure. FLUOROSCOPY TIME:  Fluoroscopy Time:  7 minutes  15 seconds Radiation Exposure Index (if provided by the fluoroscopic device): 87.3 mGy Number of Acquired Spot Images: 0 COMPARISON:  MRCP 06/23/2021 FINDINGS: Two intraoperative saved images demonstrate a flexible duodenal scope in the descending duodenum. A small plastic stent is present in the pancreatic duct. The images then demonstrate sphincterotomy and balloon sweeping of the common bile duct. IMPRESSION: 1. A ERCP with sphincterotomy and balloon sweeping of the common duct. 2. Plastic pancreatic stent in place. These images were submitted for radiologic interpretation only. Please see the procedural report for the amount of contrast and the fluoroscopy time utilized. Electronically Signed   By: Jacqulynn Cadet M.D.   On: 06/24/2021 11:54   ECHOCARDIOGRAM COMPLETE  Result Date: 06/24/2021    ECHOCARDIOGRAM REPORT   Patient Name:   [name] [name] Date of Exam: 06/24/2021 Medical Rec #:  [id number]             Height:       78.0  in Accession #:    [id number]            Weight:       189.6 lb Date of Birth:  [birth year]             BSA:          2.207 m Patient Age:    55 years              BP:           153/88 mmHg Patient Gender: M                     HR:           59 bpm. Exam Location:  Inpatient Procedure: 2D Echo, 3D Echo, Cardiac Doppler and Color Doppler Indications:    I50.40* Unspecified combined systolic (congestive) and diastolic                 (congestive) heart failure  History:        Patient has prior history of Echocardiogram examinations, most                 recent 05/23/2018. CHF and Cardiomyopathy, Abnormal ECG,                 Signs/Symptoms:Bacteremia, Shortness of Breath, Dyspnea and                 Dizziness/Lightheadedness; Risk Factors:Hypertension and                 Diabetes. ESRD. Pericardial effusion.  Sonographer:    Roseanna Rainbow RDCS Referring Phys: [id number] [place]  Sonographer Comments: Technically difficult study due to poor echo windows. IMPRESSIONS  1. Left ventricular ejection fraction, by estimation, is 30 to 35%. The left ventricle has moderately decreased function. The left ventricle demonstrates global hypokinesis. The left ventricular internal cavity size was moderately dilated. There is moderate left ventricular hypertrophy. Left ventricular diastolic parameters are consistent with Grade I diastolic dysfunction (impaired relaxation). Elevated left ventricular end-diastolic pressure.  2. Right ventricular systolic function is mildly reduced. The right ventricular size is normal.  3. The pericardial effusion is circumferential. There is no evidence of cardiac tamponade.  4. The mitral valve is abnormal. Mild mitral valve regurgitation.  5. The aortic valve is tricuspid. Aortic valve regurgitation is not visualized. Mild aortic valve sclerosis is present, with no evidence of aortic valve stenosis.  6. Aortic dilatation noted. There is mild dilatation of the  ascending aorta, measuring 39 mm.  7.  The inferior vena cava is normal in size with greater than 50% respiratory variability, suggesting right atrial pressure of 3 mmHg. Comparison(s): Changes from prior study are noted. 05/23/2018: LVEF 40-45%. FINDINGS  Left Ventricle: Left ventricular ejection fraction, by estimation, is 30 to 35%. The left ventricle has moderately decreased function. The left ventricle demonstrates global hypokinesis. The left ventricular internal cavity size was moderately dilated. There is moderate left ventricular hypertrophy. Left ventricular diastolic parameters are consistent with Grade I diastolic dysfunction (impaired relaxation). Elevated left ventricular end-diastolic pressure. Right Ventricle: The right ventricular size is normal. No increase in right ventricular wall thickness. Right ventricular systolic function is mildly reduced. Left Atrium: Left atrial size was normal in size. Right Atrium: Right atrial size was normal in size. Pericardium: Trivial pericardial effusion is present. The pericardial effusion is circumferential. There is no evidence of cardiac tamponade. Mitral Valve: The mitral valve is abnormal. There is moderate thickening of the mitral valve leaflet(s). Mild mitral valve regurgitation. Tricuspid Valve: The tricuspid valve is grossly normal. Tricuspid valve regurgitation is trivial. Aortic Valve: The aortic valve is tricuspid. Aortic valve regurgitation is not visualized. Mild aortic valve sclerosis is present, with no evidence of aortic valve stenosis. Pulmonic Valve: The pulmonic valve was grossly normal. Pulmonic valve regurgitation is mild. Aorta: Aortic dilatation noted. There is mild dilatation of the ascending aorta, measuring 39 mm. Venous: The inferior vena cava is normal in size with greater than 50% respiratory variability, suggesting right atrial pressure of 3 mmHg. IAS/Shunts: No atrial level shunt detected by color flow Doppler.  LEFT VENTRICLE PLAX 2D LVIDd:         6.30 cm       Diastology LVIDs:         5.10 cm      LV e' medial:    3.48 cm/s LV PW:         1.70 cm      LV E/e' medial:  23.5 LV IVS:        1.10 cm      LV e' lateral:   8.16 cm/s LVOT diam:     2.30 cm      LV E/e' lateral: 10.0 LV SV:         107 LV SV Index:   49 LVOT Area:     4.15 cm  LV Volumes (MOD) LV vol d, MOD A2C: 200.0 ml LV vol d, MOD A4C: 282.0 ml LV vol s, MOD A2C: 135.0 ml LV vol s, MOD A4C: 188.0 ml LV SV MOD A2C:     65.0 ml LV SV MOD A4C:     282.0 ml LV SV MOD BP:      71.0 ml RIGHT VENTRICLE            IVC RV S prime:     8.92 cm/s  IVC diam: 1.30 cm TAPSE (M-mode): 2.2 cm LEFT ATRIUM             Index       RIGHT ATRIUM           Index LA diam:        4.40 cm 1.99 cm/m  RA Area:     17.40 cm LA Vol (A2C):   68.8 ml 31.18 ml/m RA Volume:   47.30 ml  21.43 ml/m LA Vol (A4C):   32.3 ml 14.64 ml/m LA Biplane Vol: 51.6 ml 23.38 ml/m  AORTIC VALVE LVOT Vmax:  116.00 cm/s LVOT Vmean:  74.900 cm/s LVOT VTI:    0.258 m  AORTA Ao Root diam: 3.80 cm Ao Asc diam:  3.85 cm MITRAL VALVE MV Area (PHT): 2.95 cm    SHUNTS MV Decel Time: 257 msec    Systemic VTI:  0.26 m MV E velocity: 81.80 cm/s  Systemic Diam: 2.30 cm MV A velocity: 96.90 cm/s MV E/A ratio:  0.84 Lyman Bishop MD Electronically signed by Lyman Bishop MD Signature Date/Time: 06/24/2021/5:11:50 PM    Final     Medications:  sodium chloride     sodium chloride     sodium chloride Stopped (06/25/21 0815)   cefTRIAXone (ROCEPHIN)  IV 2 g (06/25/21 1438)    aspirin EC  81 mg Oral Daily   calcitRIOL  0.75 mcg Oral Q T,Th,Sa-HD   calcium carbonate  2,000 mg Oral TID WC   carvedilol  37.5 mg Oral BID WC   Chlorhexidine Gluconate Cloth  6 each Topical Q0600   indomethacin  100 mg Rectal Once   insulin aspart  0-9 Units Subcutaneous Q6H   ivabradine  5 mg Oral BID WC   lanthanum  3,000 mg Oral TID WC   oxyCODONE       sacubitril-valsartan  1 tablet Oral BID    Dialysis Orders: TTS - [facility]  4hrs 43min, BFR 400, DFR 500,  EDW 88kg, 2K/  2.5Ca   Access: LU AVF  Heparin 4000 unit bolus Mircera 50 mcg q2wks - last 6/23 Calcitriol 0.106mcg PO qHD     Assessment/Plan:  Acute cholecystitis - noted on MRI. LFTs elevated. ERCP with CBD stone removal.  Status post cholecystectomy on 7/4.  Appears well today and likely to discharge.  ESRD -  on HD TTS.  Plan for hemodialysis today.  Likely can discharge after dialysis today and resume his outpatient schedule TTS.    Hypertension/volume  -blood pressure well controlled.  Under dry weight.  Likely needs dry weight adjusted outpatient  Anemia of CKD - Hgb 9.3. 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 ESA due 7/7. Follow trends.  Secondary Hyperparathyroidism -  Ca and phosphorus at goal. Continue binders and vdra.   Nutrition - Renal diet w/fluid restrictions when not NPO. CHF - last EF improved to 45% DMT2 - per primary.   Jen Mow, PA-C [facility] 06/26/2021,9:49 AM  LOS: 3 days

## 2021-06-27 ENCOUNTER — Telehealth (HOSPITAL_COMMUNITY): Payer: Self-pay | Admitting: Nephrology

## 2021-06-27 LAB — SURGICAL PATHOLOGY

## 2021-06-27 NOTE — Telephone Encounter (Signed)
Transition of care contact from inpatient facility  Date of discharge: 06/26/21 Date of contact: 06/27/21 Method: Phone Spoke to: Patient  Patient contacted to discuss transition of care from recent inpatient hospitalization. Patient was admitted to [facility] from 7/4-06/27/21 with discharge diagnosis of acute cholecystitis s/p cholecystectomy.  Medication changes were reviewed. Has pain meds prn -- still very sore.  Patient will follow up with his/her outpatient HD unit on: tomorrow, usual time. He says will be there.  No other needs at the moment.  Veneta Penton, PA-C [employer] Pager [phone]

## 2021-06-29 NOTE — Discharge Summary (Signed)
[facility] Discharge Summary   Patient: [name] [name] TTS:[id number]  PCP: [name], PA-C  Date of admission: 06/22/2021   Date of discharge: 06/26/2021     Discharge Diagnoses:  Principal Problem:   Nausea vomiting and diarrhea Active Problems:   Essential hypertension   Chronic diastolic CHF (congestive heart failure) ([place])   Type 2 diabetes mellitus with diabetic polyneuropathy, with long-term current use of insulin (H[facility])   Anemia of chronic disease   ESRD on dialysis ([place])   Mixed diabetic hyperlipidemia associated with type 2 diabetes mellitus ([place])   Abnormal liver enzymes   Diabetic polyneuropathy associated with type 2 diabetes mellitus ([place])   Admitted From: Home Disposition:  Home   Recommendations for Outpatient Follow-up:  PCP: Follow-up with PCP, surgery as recommended   Follow-up Information     Surgery, [place]. Call in 2 week(s).   Specialty: General Surgery Why: you will need to follow up in 2-3 weeks from your surgery. Please call in 1 week to confirm appointment date and time. Please arrive 30 minutes prior to your appointment time to complete check in process. Bring photo ID and insurance card Contact information: [address] [place] [address] [phone]         [name], PA-C. Schedule an appointment as soon as possible for a visit in 1 week(s).   Specialty: Physician Assistant Contact information: [address] [place] [address] [phone]                Discharge Instructions     Diet - low sodium heart healthy   Complete by: As directed    Increase activity slowly   Complete by: As directed    Leave dressing on - Keep it clean, dry, and intact until clinic visit   Complete by: As directed        Diet recommendation: Renal diet  Activity: The patient is advised to gradually reintroduce usual activities, as tolerated  Discharge Condition:  stable  Code Status: Full code   History of present illness: As per the H and P dictated on admission, "55 year old male with past medical history of end-stage renal disease (Tues, Thurs Sat), hypertension, insulin-dependent diabetes mellitus type 2, diastolic congestive heart failure, anemia of chronic disease, hyperlipidemia who presents to [facility] telemetry unit as a transfer from [place] emergency department after experiencing nausea vomiting and abdominal pain.   Patient explains that approximately 3 weeks ago while traveling to see a friend in [place] he suddenly began to experience an episode of intense nausea and several bouts of nonbilious nonbloody vomiting.  This was associated with moderate nonradiating epigastric abdominal pain.  The symptoms lasted for approximately 24 hours and spontaneously resolved.   Then, on 6/30, as the patient was receiving dialysis he had yet another similar episode of symptoms.  He vomited his breakfast he had eaten earlier in the day during dialysis.  Patient once again began to experience epigastric abdominal pain.  This nausea and epigastric pain continues to persist until the afternoon but he attempted to go to work anyway.  While attempting to go to work patient experienced additional episodes of nonbilious nonbloody vomiting.  At this point the patient went to go see his primary care provider.  Primary care provider ordered some basic labs and the patient went home.   Patient states that his nausea and abdominal discomfort was beginning to gradually subside overnight.  The  following day on 7/1 patient was contacted at work and was notified that his liver enzymes were abnormal and was instructed to go to the emergency department.   Upon further questioning, the patient denies sick contacts, recent travel, recent antibiotic use, alcohol use, drug use or contact with confirmed COVID-19 infection.   Upon evaluation in the emergency  department AST found to be 431, ALT found to be 547, total bilirubin found to be 5.0, alkaline phosphatase found to be 229.  Ultrasound of the right upper quadrant was performed revealing cholelithiasis with gallbladder wall thickening and edema but Percell Miller sign was negative.  While in the emergency department patient also experienced several episodes of watery diarrhea.  ER provider discussed case with Dr. Watt Climes who recommended transferring patient to the hospital, obtaining an MRCP and then calling a formal consultation based on these results.  The hospitalist group was then contacted and patient was accepted to the medical floor at [facility], due to the fact the patient has a history of ESRD."  Hospital Course:  Summary of his active problems in the hospital is as following.   Acute cholecystitis. Choledocholithiasis Underwent ERCP as MRCP scan was actually showing CBD stone. Tolerated procedure very well. Patient was seen by general surgery.  Underwent lap chole. Had stone spillage which were retrieved.   Surgery does not require any antibiotic on discharge. Tolerating oral diet.  ESRD on HD. Currently on transplant list process. Nephrology was consulted underwent hemodialysis as scheduled.  HTN Blood pressure stable.  Type II DM with diabetic neuropathy with long-term insulin use, uncontrolled with hyperglycemia and hypoglycemia. Resuming home sliding scale insulin.  Chronic combined systolic and diastolic CHF. Well compensated. Monitor.  Anemia of CKD. Management per nephrology.  Body mass index is 22.42 kg/m.    Nutrition Interventions:        Pain control  - [place] Controlled Substance Reporting System database was reviewed. - 5 day supply was provided. - Patient was instructed, not to drive, operate heavy machinery, perform activities at heights, swimming or participation in water activities or provide baby sitting services while on Pain, Sleep  and Anxiety Medications; until his outpatient Physician has advised to do so again.  - Also recommended to not to take more than prescribed Pain, Sleep and Anxiety Medications.  Patient was seen by physical therapy, who recommended Home Health,. On the day of the discharge the patient's vitals were stable, and no other new acute medical condition were reported. The patient was felt safe to be discharge at SNF with no therapy needed on discharge.  Consultants: Gastroenterology General surgery  Procedures: ERCP Lap chole HD  DISCHARGE MEDICATION: Allergies as of 06/26/2021       Reactions   Penicillins Rash, Other (See Comments)   Tolerating Ceftriaxone (03/12/12) Has patient had a PCN reaction causing immediate rash, facial/tongue/throat swelling, SOB or lightheadedness with hypotension: Yes Has patient had a PCN reaction causing severe rash involving mucus membranes or skin necrosis: Unk Has patient had a PCN reaction that required hospitalization: No Has patient had a PCN reaction occurring within the last 10 years: No If all of the above answers are "NO", then may proceed with Cephalosporin use.        Medication List     TAKE these medications    acetaminophen 325 MG tablet Commonly known as: TYLENOL Take 2 tablets (650 mg total) by mouth every 6 (six) hours as needed for up to 5 days for mild pain or  moderate pain.   aspirin 81 MG tablet Take 81 mg by mouth at bedtime.   b complex-vitamin c-folic acid 0.8 MG Tabs tablet Take 1 tablet by mouth daily.   calcium elemental as carbonate 400 MG chewable tablet Commonly known as: BARIATRIC TUMS ULTRA Chew 2,000 mg by mouth 3 (three) times daily.   carvedilol 25 MG tablet Commonly known as: COREG Take 1.5 tablets (37.5 mg total) by mouth 2 (two) times daily with a meal.   Entresto 49-51 MG Generic drug: sacubitril-valsartan Take 1 tablet by mouth 2 (two) times daily.   ethyl chloride spray Apply 1 application topically  3 (three) times a week. Apply 1 spray to skin three times weekly.   FreeStyle Libre 14 Day Reader Kerrin Mo 1 each by Does not apply route See admin instructions. Use to monitor blood sugars.   gabapentin 100 MG capsule Commonly known as: NEURONTIN Take 100 mg by mouth 3 (three) times daily as needed (nerve pain).   ivabradine 5 MG Tabs tablet Commonly known as: CORLANOR Take 5 mg by mouth 2 (two) times daily with a meal.   menthol-cetylpyridinium 3 MG lozenge Commonly known as: CEPACOL Take 1 lozenge (3 mg total) by mouth as needed for sore throat.   methocarbamol 500 MG tablet Commonly known as: Robaxin Take 1 tablet (500 mg total) by mouth every 6 (six) hours as needed for up to 5 days for muscle spasms (abdominal pain).   oxyCODONE 5 MG immediate release tablet Commonly known as: Oxy IR/ROXICODONE Take 1 tablet (5 mg total) by mouth every 4 (four) hours as needed for up to 5 days for moderate pain or severe pain.   Tyler Aas FlexTouch 100 UNIT/ML FlexTouch Pen Generic drug: insulin degludec Inject 0.38 mLs (38 Units total) into the skin daily.   Vitamin D (Ergocalciferol) 1.25 MG (50000 UNIT) Caps capsule Commonly known as: DRISDOL Take 50,000 Units by mouth every 7 (seven) days.       ASK your doctor about these medications    FreeStyle Libre 2 Sensor Misc USE AS DIRECTED   glucose blood test strip Use Onetouch verio test strips as instructed to check blood sugar twice daily.   lanthanum 1000 MG chewable tablet Commonly known as: FOSRENOL Chew 1 tablet (1,000 mg total) by mouth 3 (three) times daily with meals.   NovoLOG FlexPen 100 UNIT/ML FlexPen Generic drug: insulin aspart 5 to 7 units for breakfast and lunch and 10 to 14 units at dinner based on meal size and carbohydrateS   OneTouch Delica Lancets 81E Misc Use onetouch delica lancets to check blood sugar twice daily.   OneTouch Verio Flex System w/Device Kit Use to check blood sugar twice daily.                Discharge Care Instructions  (From admission, onward)           Start     Ordered   06/26/21 0000  Leave dressing on - Keep it clean, dry, and intact until clinic visit        06/26/21 1355            Discharge Exam: Filed Weights   06/24/21 0904 06/26/21 0700 06/26/21 1107  Weight: 86 kg 89.1 kg 88 kg   Vitals:   06/26/21 1107 06/26/21 1112  BP: (!) 151/64 (!) 148/67  Pulse: 63   Resp: 15   Temp: 97.9 F (36.6 C)   SpO2: 100%    General: Appear in mild distress, no  Rash; Oral Mucosa Clear, moist. no Abnormal Neck Mass Or lumps, Conjunctiva normal  Cardiovascular: S1 and S2 Present, no Murmur, Respiratory: good respiratory effort, Bilateral Air entry present and CTA, no Crackles, no wheezes Abdomen: Bowel Sound present, Soft and no tenderness Extremities: no Pedal edema Neurology: alert and oriented to time, place, and person affect appropriate. no new focal deficit Gait not checked due to patient safety concerns    The results of significant diagnostics from this hospitalization (including imaging, microbiology, ancillary and laboratory) are listed below for reference.    Significant Diagnostic Studies: DG ERCP  Result Date: 06/24/2021 CLINICAL DATA:  Cholecystitis with choledocholithiasis EXAM: ERCP TECHNIQUE: Multiple spot images obtained with the fluoroscopic device and submitted for interpretation post-procedure. FLUOROSCOPY TIME:  Fluoroscopy Time:  7 minutes 15 seconds Radiation Exposure Index (if provided by the fluoroscopic device): 87.3 mGy Number of Acquired Spot Images: 0 COMPARISON:  MRCP 06/23/2021 FINDINGS: Two intraoperative saved images demonstrate a flexible duodenal scope in the descending duodenum. A small plastic stent is present in the pancreatic duct. The images then demonstrate sphincterotomy and balloon sweeping of the common bile duct. IMPRESSION: 1. A ERCP with sphincterotomy and balloon sweeping of the common duct. 2. Plastic  pancreatic stent in place. These images were submitted for radiologic interpretation only. Please see the procedural report for the amount of contrast and the fluoroscopy time utilized. Electronically Signed   By: Jacqulynn Cadet M.D.   On: 06/24/2021 11:54   MR ABDOMEN MRCP W WO CONTAST  Result Date: 06/23/2021 CLINICAL DATA:  Jaundice. EXAM: MRI ABDOMEN WITHOUT AND WITH CONTRAST (INCLUDING MRCP) TECHNIQUE: Multiplanar multisequence MR imaging of the abdomen was performed both before and after the administration of intravenous contrast. Heavily T2-weighted images of the biliary and pancreatic ducts were obtained, and three-dimensional MRCP images were rendered by post processing. CONTRAST:  74m GADAVIST GADOBUTROL 1 MMOL/ML IV SOLN COMPARISON:  Noncontrast CT on 02/06/2019 FINDINGS: Lower chest: No acute findings. Hepatobiliary: No hepatic masses identified. Diffusely decreased T2 signal intensity of hepatic parenchyma is consistent with increased iron deposition. Numerous tiny gallstones are seen nearly filling the gallbladder. Diffuse gallbladder wall thickening is seen as well as mild pericholecystic edema, consistent with acute cholecystitis. No evidence of biliary ductal dilatation with common bile duct measuring 5 mm in diameter. However, 2 tiny calculi are seen in the distal common bile duct, largest measuring 5 mm. Pancreas: No mass or inflammatory changes. No evidence of pancreatic ductal dilatation or pancreas divisum. Spleen: Within normal limits in size. No masses identified. Diffuse T2 signal intensity of splenic parenchyma is consistent with increased iron deposition. Adrenals/Urinary Tract: No masses identified. A few tiny sub-cm renal cysts are seen bilaterally. No evidence of hydronephrosis. Stomach/Bowel: Visualized portion unremarkable. Vascular/Lymphatic: No pathologically enlarged lymph nodes identified. No acute vascular findings. Other:  None. Musculoskeletal: No suspicious bone lesions  identified. Diffuse T2 hypointensity of the marrow signal seen, consistent with increased iron deposition. IMPRESSION: Findings consistent with acute cholecystitis. Two tiny distal common bile duct calculi, largest measuring 5 mm. No evidence of biliary ductal dilatation. Increased iron deposition within the liver, spleen, and bone marrow, consistent with hemosiderosis. Electronically Signed   By: JMarlaine HindM.D.   On: 06/23/2021 10:52   ECHOCARDIOGRAM COMPLETE  Result Date: 06/24/2021    ECHOCARDIOGRAM REPORT   Patient Name:   RJAVONI LUCKENDate of Exam: 06/24/2021 Medical Rec #:  [id number]            Height:  78.0 in Accession #:    [id number]            Weight:       189.6 lb Date of Birth:  [birth year]             BSA:          2.207 m Patient Age:    55 years              BP:           153/88 mmHg Patient Gender: M                     HR:           59 bpm. Exam Location:  Inpatient Procedure: 2D Echo, 3D Echo, Cardiac Doppler and Color Doppler Indications:    I50.40* Unspecified combined systolic (congestive) and diastolic                 (congestive) heart failure  History:        Patient has prior history of Echocardiogram examinations, most                 recent 05/23/2018. CHF and Cardiomyopathy, Abnormal ECG,                 Signs/Symptoms:Bacteremia, Shortness of Breath, Dyspnea and                 Dizziness/Lightheadedness; Risk Factors:Hypertension and                 Diabetes. ESRD. Pericardial effusion.  Sonographer:    Roseanna Rainbow RDCS Referring Phys: [id number]  Sonographer Comments: Technically difficult study due to poor echo windows. IMPRESSIONS  1. Left ventricular ejection fraction, by estimation, is 30 to 35%. The left ventricle has moderately decreased function. The left ventricle demonstrates global hypokinesis. The left ventricular internal cavity size was moderately dilated. There is moderate left ventricular hypertrophy. Left ventricular diastolic parameters  are consistent with Grade I diastolic dysfunction (impaired relaxation). Elevated left ventricular end-diastolic pressure.  2. Right ventricular systolic function is mildly reduced. The right ventricular size is normal.  3. The pericardial effusion is circumferential. There is no evidence of cardiac tamponade.  4. The mitral valve is abnormal. Mild mitral valve regurgitation.  5. The aortic valve is tricuspid. Aortic valve regurgitation is not visualized. Mild aortic valve sclerosis is present, with no evidence of aortic valve stenosis.  6. Aortic dilatation noted. There is mild dilatation of the ascending aorta, measuring 39 mm.  7. The inferior vena cava is normal in size with greater than 50% respiratory variability, suggesting right atrial pressure of 3 mmHg. Comparison(s): Changes from prior study are noted. 05/23/2018: LVEF 40-45%. FINDINGS  Left Ventricle: Left ventricular ejection fraction, by estimation, is 30 to 35%. The left ventricle has moderately decreased function. The left ventricle demonstrates global hypokinesis. The left ventricular internal cavity size was moderately dilated. There is moderate left ventricular hypertrophy. Left ventricular diastolic parameters are consistent with Grade I diastolic dysfunction (impaired relaxation). Elevated left ventricular end-diastolic pressure. Right Ventricle: The right ventricular size is normal. No increase in right ventricular wall thickness. Right ventricular systolic function is mildly reduced. Left Atrium: Left atrial size was normal in size. Right Atrium: Right atrial size was normal in size. Pericardium: Trivial pericardial effusion is present. The pericardial effusion is circumferential. There is no evidence of cardiac tamponade. Mitral Valve: The mitral valve is abnormal. There is moderate  thickening of the mitral valve leaflet(s). Mild mitral valve regurgitation. Tricuspid Valve: The tricuspid valve is grossly normal. Tricuspid valve regurgitation is  trivial. Aortic Valve: The aortic valve is tricuspid. Aortic valve regurgitation is not visualized. Mild aortic valve sclerosis is present, with no evidence of aortic valve stenosis. Pulmonic Valve: The pulmonic valve was grossly normal. Pulmonic valve regurgitation is mild. Aorta: Aortic dilatation noted. There is mild dilatation of the ascending aorta, measuring 39 mm. Venous: The inferior vena cava is normal in size with greater than 50% respiratory variability, suggesting right atrial pressure of 3 mmHg. IAS/Shunts: No atrial level shunt detected by color flow Doppler.  LEFT VENTRICLE PLAX 2D LVIDd:         6.30 cm      Diastology LVIDs:         5.10 cm      LV e' medial:    3.48 cm/s LV PW:         1.70 cm      LV E/e' medial:  23.5 LV IVS:        1.10 cm      LV e' lateral:   8.16 cm/s LVOT diam:     2.30 cm      LV E/e' lateral: 10.0 LV SV:         107 LV SV Index:   49 LVOT Area:     4.15 cm  LV Volumes (MOD) LV vol d, MOD A2C: 200.0 ml LV vol d, MOD A4C: 282.0 ml LV vol s, MOD A2C: 135.0 ml LV vol s, MOD A4C: 188.0 ml LV SV MOD A2C:     65.0 ml LV SV MOD A4C:     282.0 ml LV SV MOD BP:      71.0 ml RIGHT VENTRICLE            IVC RV S prime:     8.92 cm/s  IVC diam: 1.30 cm TAPSE (M-mode): 2.2 cm LEFT ATRIUM             Index       RIGHT ATRIUM           Index LA diam:        4.40 cm 1.99 cm/m  RA Area:     17.40 cm LA Vol (A2C):   68.8 ml 31.18 ml/m RA Volume:   47.30 ml  21.43 ml/m LA Vol (A4C):   32.3 ml 14.64 ml/m LA Biplane Vol: 51.6 ml 23.38 ml/m  AORTIC VALVE LVOT Vmax:   116.00 cm/s LVOT Vmean:  74.900 cm/s LVOT VTI:    0.258 m  AORTA Ao Root diam: 3.80 cm Ao Asc diam:  3.85 cm MITRAL VALVE MV Area (PHT): 2.95 cm    SHUNTS MV Decel Time: 257 msec    Systemic VTI:  0.26 m MV E velocity: 81.80 cm/s  Systemic Diam: 2.30 cm MV A velocity: 96.90 cm/s MV E/A ratio:  0.84 Lyman Bishop MD Electronically signed by Lyman Bishop MD Signature Date/Time: 06/24/2021/5:11:50 PM    Final    US Abdomen  Limited RUQ (LIVER/GB)  Result Date: 06/22/2021 CLINICAL DATA:  Abdominal pain, nausea, abnormal labs. EXAM: ULTRASOUND ABDOMEN LIMITED RIGHT UPPER QUADRANT COMPARISON:  CT 01/22/2021 FINDINGS: Gallbladder: Cholelithiasis with multiple small stones filling the gallbladder. Gallbladder wall thickening with wall thickness measuring 4.1 mm. Mild edema. Murphy's sign is negative. Common bile duct: Diameter: 6 mm, normal Liver: No focal lesion identified. Within normal limits in parenchymal echogenicity. Portal vein  is patent on color Doppler imaging with normal direction of blood flow towards the liver. Other: None. IMPRESSION: Cholelithiasis with gallbladder wall thickening and edema. This may indicate cholecystitis in the appropriate clinical setting although the Murphy's sign is negative. Electronically Signed   By: Lucienne Capers M.D.   On: 06/22/2021 22:01    Microbiology: Recent Results (from the past 240 hour(s))  Resp Panel by RT-PCR (Flu A&B, Covid) Nasopharyngeal Swab     Status: None   Collection Time: 06/22/21 11:41 PM   Specimen: Nasopharyngeal Swab; Nasopharyngeal(NP) swabs in vial transport medium  Result Value Ref Range Status   SARS Coronavirus 2 by RT PCR NEGATIVE NEGATIVE Final    Comment: (NOTE) SARS-CoV-2 target nucleic acids are NOT DETECTED.  The SARS-CoV-2 RNA is generally detectable in upper respiratory specimens during the acute phase of infection. The lowest concentration of SARS-CoV-2 viral copies this assay can detect is 138 copies/mL. A negative result does not preclude SARS-Cov-2 infection and should not be used as the sole basis for treatment or other patient management decisions. A negative result may occur with  improper specimen collection/handling, submission of specimen other than nasopharyngeal swab, presence of viral mutation(s) within the areas targeted by this assay, and inadequate number of viral copies(<138 copies/mL). A negative result must be combined  with clinical observations, patient history, and epidemiological information. The expected result is Negative.  Fact Sheet for Patients:  EntrepreneurPulse.com.au  Fact Sheet for Healthcare Providers:  IncredibleEmployment.be  This test is no t yet approved or cleared by the [place] FDA and  has been authorized for detection and/or diagnosis of SARS-CoV-2 by FDA under an Emergency Use Authorization (EUA). This EUA will remain  in effect (meaning this test can be used) for the duration of the COVID-19 declaration under Section 564(b)(1) of the Act, 21 U.S.C.section 360bbb-3(b)(1), unless the authorization is terminated  or revoked sooner.       Influenza A by PCR NEGATIVE NEGATIVE Final   Influenza B by PCR NEGATIVE NEGATIVE Final    Comment: (NOTE) The Xpert Xpress SARS-CoV-2/FLU/RSV plus assay is intended as an aid in the diagnosis of influenza from Nasopharyngeal swab specimens and should not be used as a sole basis for treatment. Nasal washings and aspirates are unacceptable for Xpert Xpress SARS-CoV-2/FLU/RSV testing.  Fact Sheet for Patients: EntrepreneurPulse.com.au  Fact Sheet for Healthcare Providers: IncredibleEmployment.be  This test is not yet approved or cleared by the [place] FDA and has been authorized for detection and/or diagnosis of SARS-CoV-2 by FDA under an Emergency Use Authorization (EUA). This EUA will remain in effect (meaning this test can be used) for the duration of the COVID-19 declaration under Section 564(b)(1) of the Act, 21 U.S.C. section 360bbb-3(b)(1), unless the authorization is terminated or revoked.  Performed at [facility], [address] [place], [place] [address]   Surgical PCR screen     Status: None   Collection Time: 06/24/21 10:36 PM   Specimen: Nasal Mucosa; Nasal Swab  Result Value Ref Range Status   MRSA, PCR NEGATIVE NEGATIVE  Final   Staphylococcus aureus NEGATIVE NEGATIVE Final    Comment: (NOTE) The Xpert SA Assay (FDA approved for NASAL specimens in patients 76 years of age and older), is one component of a comprehensive surveillance program. It is not intended to diagnose infection nor to guide or monitor treatment. Performed at [facility], [place] [address], [place], [place] [address]      Labs: CBC: Recent  Labs  Lab 06/22/21 2021 06/24/21 0625 06/25/21 0608 06/26/21 0628  WBC 6.9 5.9 6.3 12.2*  NEUTROABS 4.8  --  3.7  --   HGB 9.7* 9.6* 9.9* 9.3*  HCT 30.5* 31.0* 31.1* 28.9*  MCV 100.0 101.0* 100.3* 99.7  PLT 204 175 174 262   Basic Metabolic Panel: Recent Labs  Lab 06/22/21 2021 06/24/21 0625 06/24/21 0647 06/25/21 0608 06/26/21 0628  NA 137 132*  --  138 132*  K 4.4 3.9  --  4.3 4.5  CL 97* 95*  --  99 96*  CO2 30 26  --  27 24  GLUCOSE 85 338*  --  89 224*  BUN 44* 31*  --  40* 64*  CREATININE 6.75* 5.96*  --  7.86* 9.67*  CALCIUM 8.2* 8.0*  --  8.4* 8.0*  PHOS  --   --  4.3  --  5.8*   Liver Function Tests: Recent Labs  Lab 06/22/21 2021 06/23/21 0824 06/24/21 0625 06/25/21 0608 06/26/21 0628  AST 431* 332* 176* 80* 52*  ALT 547* 510* 381* 279* 201*  ALKPHOS 229* 209* 200* 185* 146*  BILITOT 5.0* 4.0* 1.4* 1.3* 1.2  PROT 7.4 6.9 6.6 6.6 6.8  ALBUMIN 3.6 3.2* 3.0* 3.1* 3.2*  3.2*   CBG: Recent Labs  Lab 06/25/21 1240 06/25/21 1600 06/25/21 2333 06/26/21 0638 06/26/21 1200  GLUCAP 125* 154* 325* 222* 218*    Time spent: 35 minutes  Signed:  Berle Mull  [facility] 06/26/2021

## 2021-07-11 ENCOUNTER — Encounter (HOSPITAL_COMMUNITY): Payer: Self-pay

## 2021-07-29 ENCOUNTER — Emergency Department (HOSPITAL_BASED_OUTPATIENT_CLINIC_OR_DEPARTMENT_OTHER)
Admission: EM | Admit: 2021-07-29 | Discharge: 2021-07-29 | Disposition: A | Discharge status: Home / Self Care | Payer: Medicare Other | Attending: Emergency Medicine | Admitting: Emergency Medicine

## 2021-07-29 ENCOUNTER — Other Ambulatory Visit: Payer: Self-pay

## 2021-07-29 ENCOUNTER — Encounter (HOSPITAL_BASED_OUTPATIENT_CLINIC_OR_DEPARTMENT_OTHER): Payer: Self-pay | Admitting: *Deleted

## 2021-07-29 ENCOUNTER — Emergency Department (HOSPITAL_BASED_OUTPATIENT_CLINIC_OR_DEPARTMENT_OTHER): Payer: Medicare Other

## 2021-07-29 DIAGNOSIS — S7012XA Contusion of left thigh, initial encounter: Secondary | ICD-10-CM | POA: Diagnosis not present

## 2021-07-29 DIAGNOSIS — E1122 Type 2 diabetes mellitus with diabetic chronic kidney disease: Secondary | ICD-10-CM | POA: Diagnosis not present

## 2021-07-29 DIAGNOSIS — Z79899 Other long term (current) drug therapy: Secondary | ICD-10-CM | POA: Diagnosis not present

## 2021-07-29 DIAGNOSIS — T79A22A Traumatic compartment syndrome of left lower extremity, initial encounter: Secondary | ICD-10-CM

## 2021-07-29 DIAGNOSIS — Z7982 Long term (current) use of aspirin: Secondary | ICD-10-CM | POA: Diagnosis not present

## 2021-07-29 DIAGNOSIS — Z794 Long term (current) use of insulin: Secondary | ICD-10-CM | POA: Insufficient documentation

## 2021-07-29 DIAGNOSIS — I120 Hypertensive chronic kidney disease with stage 5 chronic kidney disease or end stage renal disease: Secondary | ICD-10-CM | POA: Diagnosis not present

## 2021-07-29 DIAGNOSIS — N186 End stage renal disease: Secondary | ICD-10-CM | POA: Insufficient documentation

## 2021-07-29 DIAGNOSIS — Z20822 Contact with and (suspected) exposure to covid-19: Secondary | ICD-10-CM | POA: Diagnosis not present

## 2021-07-29 DIAGNOSIS — Z992 Dependence on renal dialysis: Secondary | ICD-10-CM | POA: Diagnosis not present

## 2021-07-29 DIAGNOSIS — M898X5 Other specified disorders of bone, thigh: Secondary | ICD-10-CM

## 2021-07-29 DIAGNOSIS — W010XXA Fall on same level from slipping, tripping and stumbling without subsequent striking against object, initial encounter: Secondary | ICD-10-CM | POA: Insufficient documentation

## 2021-07-29 DIAGNOSIS — S79922A Unspecified injury of left thigh, initial encounter: Secondary | ICD-10-CM | POA: Diagnosis present

## 2021-07-29 LAB — BASIC METABOLIC PANEL
Anion gap: 9 (ref 5–15)
BUN: 37 mg/dL — ABNORMAL HIGH (ref 6–20)
CO2: 31 mmol/L (ref 22–32)
Calcium: 9.3 mg/dL (ref 8.9–10.3)
Chloride: 98 mmol/L (ref 98–111)
Creatinine, Ser: 5.77 mg/dL — ABNORMAL HIGH (ref 0.61–1.24)
GFR, Estimated: 11 mL/min — ABNORMAL LOW (ref >60)
Glucose, Bld: 200 mg/dL — ABNORMAL HIGH (ref 70–99)
Potassium: 5.3 mmol/L — ABNORMAL HIGH (ref 3.5–5.1)
Sodium: 138 mmol/L (ref 135–145)

## 2021-07-29 LAB — CBC WITH DIFFERENTIAL/PLATELET
Abs Immature Granulocytes: 0.02 10*3/uL (ref 0.00–0.07)
Basophils Absolute: 0.1 10*3/uL (ref 0.0–0.1)
Basophils Relative: 1 %
Eosinophils Absolute: 0.4 10*3/uL (ref 0.0–0.5)
Eosinophils Relative: 4 %
HCT: 32 % — ABNORMAL LOW (ref 39.0–52.0)
Hemoglobin: 10.1 g/dL — ABNORMAL LOW (ref 13.0–17.0)
Immature Granulocytes: 0 %
Lymphocytes Relative: 12 %
Lymphs Abs: 1.1 10*3/uL (ref 0.7–4.0)
MCH: 31.1 pg (ref 26.0–34.0)
MCHC: 31.6 g/dL (ref 30.0–36.0)
MCV: 98.5 fL (ref 80.0–100.0)
Monocytes Absolute: 0.7 10*3/uL (ref 0.1–1.0)
Monocytes Relative: 8 %
Neutro Abs: 6.5 10*3/uL (ref 1.7–7.7)
Neutrophils Relative %: 75 %
Platelets: 167 10*3/uL (ref 150–400)
RBC: 3.25 MIL/uL — ABNORMAL LOW (ref 4.22–5.81)
RDW: 14.9 % (ref 11.5–15.5)
WBC: 8.8 10*3/uL (ref 4.0–10.5)
nRBC: 0 % (ref 0.0–0.2)

## 2021-07-29 LAB — RESP PANEL BY RT-PCR (FLU A&B, COVID) ARPGX2
Influenza A by PCR: NEGATIVE
Influenza B by PCR: NEGATIVE
SARS Coronavirus 2 by RT PCR: NEGATIVE

## 2021-07-29 MED ORDER — LIDOCAINE HCL (PF) 1 % IJ SOLN
INTRAMUSCULAR | Status: AC
  Administered 2021-07-29: 10 mL
  Filled 2021-07-29: qty 10

## 2021-07-29 MED ORDER — IOHEXOL 300 MG/ML  SOLN
75.0000 mL | Freq: Once | INTRAMUSCULAR | Status: AC | PRN
  Administered 2021-07-29: 75 mL via INTRAVENOUS

## 2021-07-29 MED ORDER — FENTANYL CITRATE (PF) 100 MCG/2ML IJ SOLN
50.0000 ug | Freq: Once | INTRAMUSCULAR | Status: AC
  Administered 2021-07-29: 50 ug via INTRAVENOUS
  Filled 2021-07-29: qty 2

## 2021-07-29 MED ORDER — LIDOCAINE HCL (PF) 1 % IJ SOLN
10.0000 mL | Freq: Once | INTRAMUSCULAR | Status: AC
  Administered 2021-07-29: 10 mL

## 2021-07-29 NOTE — Discharge Instructions (Addendum)
As per Orthopedics call to follow up with Dr Marlou Sa. Info provided above. No evidence of compartment syndrome.

## 2021-07-29 NOTE — Consult Note (Signed)
ORTHOPAEDIC CONSULTATION  REQUESTING PHYSICIAN: Lennice Sites, DO  Chief Complaint: "My left quad hurts"  HPI: KAIVEN VESTER is a 55 y.o. male who presents with left thigh pain following fall yesterday.  He fell yesterday afternoon landing on his anterior lateral quad and reports increased swelling in the area since he injury.  He was able to weight-bear following the injury but states that pain progressively worsened over the course of the night to the point where it began waking him up last night.  He has been able to weight-bear throughout the entirety of his injury course.  Denies any new numbness or tingling or any weakness.  Legs not giving out on him.  Past Medical History:  Diagnosis Date   Bacteremia    Diabetes mellitus    Dialysis patient [facility])    Hypertension    Pneumonia 02/2012   Strep pneumoniae bilateral pneumonia complicated by bacteremia   Renal disorder    End stage, not on dialysis   Past Surgical History:  Procedure Laterality Date   AMPUTATION TOE     AV FISTULA PLACEMENT Left 05/22/2018   Procedure: Creation of BrachiaCephalic Fistula Left Arm;  Surgeon: Angelia Mould, MD;  Location: [place];  Service: Vascular;  Laterality: Left;   [place] Left 11/12/2018   Procedure: BASILIC VEIN TRANSPOSITION LEFT ARM;  Surgeon: Serafina Mitchell, MD;  Location: [place];  Service: Vascular;  Laterality: Left;   CHEST TUBE INSERTION     placed during hospitalization 02/2012   CHOLECYSTECTOMY N/A 06/25/2021   Procedure: LAPAROSCOPIC CHOLECYSTECTOMY;  Surgeon: Stark Klein, MD;  Location: [place];  Service: General;  Laterality: N/A;   COLONOSCOPY  04/01/2012   Procedure: COLONOSCOPY;  Surgeon: Juanita Craver, MD;  Location: [place] END[facility]COPY;  Service: Endoscopy;  Laterality: N/A;   ERCP N/A 06/24/2021   Procedure: ENDOSCOPIC RETROGRADE CHOLANGIOPANCREATOGRAPHY (ERCP);  Surgeon: Clarene Essex, MD;  Location: [place];  Service: Endoscopy;  Laterality: N/A;    ESOPHAGOGASTRODUODENOSCOPY  03/31/2012   Procedure: ESOPHAGOGASTRODUODENOSCOPY (EGD);  Surgeon: Beryle Beams, MD;  Location: [facility] ENDOSCOPY;  Service: Endoscopy;  Laterality: N/A;   EXCHANGE OF A DIALYSIS CATHETER Right 05/22/2018   Procedure: EXCHANGE OF A DIALYSIS CATHETER;  Surgeon: Angelia Mould, MD;  Location: [place];  Service: Vascular;  Laterality: Right;   IR FLUORO GUIDE CV LINE RIGHT  05/20/2018   IR THROMBECTOMY AV FISTULA W/THROMBOLYSIS INC/SHUNT/IMG LEFT Left 09/10/2018   IR US GUIDE VASC ACCESS LEFT  09/10/2018   IR US GUIDE VASC ACCESS RIGHT  05/20/2018   PANCREATIC STENT PLACEMENT  06/24/2021   Procedure: PANCREATIC STENT PLACEMENT;  Surgeon: Clarene Essex, MD;  Location: [place];  Service: Endoscopy;;   REMOVAL OF STONES  06/24/2021   Procedure: REMOVAL OF STONES;  Surgeon: Clarene Essex, MD;  Location: [place];  Service: Endoscopy;;   SPHINCTEROTOMY  06/24/2021   Procedure: Joan Mayans;  Surgeon: Clarene Essex, MD;  Location: [facility] ENDOSCOPY;  Service: Endoscopy;;   STENT REMOVAL  06/24/2021   Procedure: STENT REMOVAL;  Surgeon: Clarene Essex, MD;  Location: [facility] ENDOSCOPY;  Service: Endoscopy;;   Social History   Socioeconomic History   Marital status: Single    Spouse name: Not on file   Number of children: Not on file   Years of education: Not on file   Highest education level: Not on file  Occupational History   Occupation: MACHINIST    Employer: [employer]  Tobacco Use   Smoking status: Never  Smokeless tobacco: Never  Substance and Sexual Activity   Alcohol use: Yes    Comment: rare    Drug use: No   Sexual activity: Not on file  Other Topics Concern   Not on file  Social History Narrative   He is from [place] and lives at home here with his daughter. He is divorced. 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    Social Determinants of Health   Financial Resource Strain: Not on file  Food Insecurity: Not on  file  Transportation Needs: Not on file  Physical Activity: Not on file  Stress: Not on file  Social Connections: Not on file   Family History  Problem Relation Age of Onset   Diabetes Maternal Grandmother    - negative except otherwise stated in the family history section Allergies  Allergen Reactions   Penicillins Rash and Other (See Comments)    Tolerating Ceftriaxone (03/12/12) Has patient had a PCN reaction causing immediate rash, facial/tongue/throat swelling, SOB or lightheadedness with hypotension: Yes Has patient had a PCN reaction causing severe rash involving mucus membranes or skin necrosis: Unk Has patient had a PCN reaction that required hospitalization: No Has patient had a PCN reaction occurring within the last 10 years: No If all of the above answers are "NO", then may proceed with Cephalosporin use.    Prior to Admission medications   Medication Sig Start Date End Date Taking? Authorizing Provider  aspirin 81 MG tablet Take 81 mg by mouth at bedtime.    [provider]  b complex-vitamin c-folic acid (NEPHRO-VITE) 0.8 MG TABS tablet Take 1 tablet by mouth daily.    [provider]  Blood Glucose Monitoring Suppl ([place]) w/Device KIT Use to check blood sugar twice daily. Patient taking differently: 1 each by Other route 2 (two) times daily. 04/21/20   Elayne Snare, MD  calcium elemental as carbonate (BARIATRIC TUMS ULTRA) 400 MG chewable tablet Chew 2,000 mg by mouth 3 (three) times daily.    [provider]  carvedilol (COREG) 25 MG tablet Take 1.5 tablets (37.5 mg total) by mouth 2 (two) times daily with a meal. 03/25/17   Troy Sine, MD  Continuous Blood Gluc Receiver (FREESTYLE LIBRE 14 DAY READER) DEVI 1 each by Does not apply route See admin instructions. Use to monitor blood sugars. 03/13/20   Elayne Snare, MD  Continuous Blood Gluc Sensor (FREESTYLE LIBRE 2 SENSOR) MISC USE AS DIRECTED Patient taking differently: 1  each by Other route as directed. 01/30/20   Elayne Snare, MD  ethyl chloride spray Apply 1 application topically 3 (three) times a week. Apply 1 spray to skin three times weekly.    [provider]  gabapentin (NEURONTIN) 100 MG capsule Take 100 mg by mouth 3 (three) times daily as needed (nerve pain).    [provider]  glucose blood test strip Use Onetouch verio test strips as instructed to check blood sugar twice daily. Patient taking differently: by Other route See admin instructions. Use Onetouch verio test strips as instructed to check blood sugar twice daily. 04/21/20   Elayne Snare, MD  insulin aspart (NOVOLOG FLEXPEN) 100 UNIT/ML FlexPen 5 to 7 units for breakfast and lunch and 10 to 14 units at dinner based on meal size and carbohydrateS Patient taking differently: Inject 5-7 Units into the skin See admin instructions. 5 to 7 units for breakfast and lunch and 10 to 14 units  at dinner based on meal size and carbohydrateS 12/10/19   Elayne Snare, MD  insulin degludec (TRESIBA FLEXTOUCH) 100 UNIT/ML FlexTouch Pen Inject 0.38 mLs (38 Units total) into the skin daily. 04/12/20   Elayne Snare, MD  ivabradine (CORLANOR) 5 MG TABS tablet Take 5 mg by mouth 2 (two) times daily with a meal.    [provider]  lanthanum (FOSRENOL) 1000 MG chewable tablet Chew 1 tablet (1,000 mg total) by mouth 3 (three) times daily with meals. Patient taking differently: Chew 2,000 mg by mouth 3 (three) times daily with meals. 05/25/18   Domenic Polite, MD  menthol-cetylpyridinium (CEPACOL) 3 MG lozenge Take 1 lozenge (3 mg total) by mouth as needed for sore throat. 06/26/21   Lavina Hamman, MD  OneTouch Delica Lancets 16X MISC Use onetouch delica lancets to check blood sugar twice daily. Patient taking differently: 1 each by Other route 2 (two) times daily. 04/21/20   Elayne Snare, MD  sacubitril-valsartan (ENTRESTO) 49-51 MG Take 1 tablet by mouth 2 (two) times daily.    [provider]   Vitamin D, Ergocalciferol, (DRISDOL) 1.25 MG (50000 UNIT) CAPS capsule Take 50,000 Units by mouth every 7 (seven) days.    [provider]   CT FEMUR LEFT W CONTRAST  Result Date: 07/29/2021 CLINICAL DATA:  Tripped and fell yesterday. Left thigh pain and swelling. EXAM: CT OF THE LOWER RIGHT EXTREMITY WITH CONTRAST TECHNIQUE: Multidetector CT imaging of the lower right extremity was performed according to the standard protocol following intravenous contrast administration. CONTRAST:  77m OMNIPAQUE IOHEXOL 300 MG/ML  SOLN COMPARISON:  Radiographs, same date. FINDINGS: The left femur is intact. No acute fracture. The left hip and knee joints are maintained. No hip or knee fractures. There is a fairly extensive intramuscular hematoma involving the quadriceps compartment extending all the way down to the distal femur. The major vascular structures are unremarkable. Scattered calcifications. No active extravasation of contrast material. IMPRESSION: 1. Fairly extensive intramuscular hematoma involving the quadriceps compartment. 2. No acute fracture. Electronically Signed   By: PMarijo SanesM.D.   On: 07/29/2021 11:45   DG FEMUR PORT MIN 2 VIEWS LEFT  Result Date: 07/29/2021 CLINICAL DATA:  Trip and fall yesterday, upper thigh pain EXAM: LEFT FEMUR PORTABLE 2 VIEWS COMPARISON:  None. FINDINGS: There is no evidence of fracture or other focal bone lesions. Soft tissues are unremarkable. IMPRESSION: No fracture or dislocation of the left femur. Electronically Signed   By: AEddie CandleM.D.   On: 07/29/2021 09:58   - pertinent xrays, CT, MRI studies were reviewed and independently interpreted  Positive ROS: All other systems have been reviewed and were otherwise negative with the exception of those mentioned in the HPI and as above.  Physical Exam: General: Alert, no acute distress Psychiatric: Patient is competent for consent with normal mood and affect Lymphatic: No axillary or cervical  lymphadenopathy Cardiovascular: No pedal edema Respiratory: No cyanosis, no use of accessory musculature GI: No organomegaly, abdomen is soft and non-tender    Images:  _0 @  Labs:  Lab Results  Component Value Date   HGBA1C 7.7 (H) 06/23/2021   HGBA1C 6.7 (H) 04/10/2020   HGBA1C 8.6 (H) 12/08/2019   ESRSEDRATE 25 (H) 07/03/2018   CRP 3.9 07/03/2018   REPTSTATUS 02/12/2019 FINAL 02/06/2019   GRAMSTAIN  04/09/2012    RARE WBC PRESENT, PREDOMINANTLY PMN NO SQUAMOUS EPITHELIAL CELLS SEEN NO ORGANISMS SEEN   CULT NO GROWTH 5 DAYS 02/06/2019   LABORGA STREPTOCOCCUS  PNEUMONIAE 03/11/2012    Lab Results  Component Value Date   ALBUMIN 3.2 (L) 06/26/2021   ALBUMIN 3.2 (L) 06/26/2021   ALBUMIN 3.1 (L) 06/25/2021    Neurologic: Patient does not have protective sensation bilateral lower extremities.   MUSCULOSKELETAL:   Ortho exam demonstrates right lower extremity with no deformity noted.  Soft and compressible compartments throughout the right lower extremity.  No knee effusion noted.  No calf tenderness.  Strong DP pulse rated 2+.  Left lower extremity with anterior lateral swelling of the left thigh.  Mild to moderate tenderness over this area of swelling with no fluctuance, surrounding erythema, skin changes.  No knee effusion.  No calf tenderness.  Compartments are a little harder compared with the right side and overall they are compressible with only mild to moderate pain.  He has strong 2+ pulse of the dorsalis pedis pulse of the left lower extremity.  Able to actively perform straight leg raise of both lower extremities with no defect of the quadricep tendon.  No pain with logroll of either lower extremity.  Assessment: Hematoma of left thigh  Plan: Discussed patient with Dr. Alphonzo Severance.  Plan to check compartment pressures of the anterior and lateral compartments.  Low suspicion for compartment syndrome but with pain that was waking him up at night last night  and his concerns today, best to check the pressures to ensure no complication after discharge.  After checking pressures, they were found to be 15 mmHg in anterior compartment and 21 mmHg in the lateral compartment.  Recommend no operative intervention.  Thank you for the consult and the opportunity to see Mr. Messiah, [facility] [phone] 3:08 PM

## 2021-07-29 NOTE — ED Provider Notes (Signed)
Patient transferred from drug bridge for evaluation by orthopedics for possible compartment syndrome.  Had a fall yesterday.  Has a hematoma to his left thigh.  Neurovascular neuromuscularly overall appears intact.  He is fairly tender in the left thigh but he does not have any severe numbness, tingling, loss of feeling in the thigh or down his legs.  He has strong pulses in his lower legs.  CT scan showed no fracture but did show intramuscular hematoma within the quadriceps compartment.  Does have chronic kidney disease.  Lab work unremarkable.  Will consult Dr. Marlou Sa for evaluation.  This chart was dictated using voice recognition software.  Despite best efforts to proofread,  errors can occur which can change the documentation meaning.    Lennice Sites, DO 07/29/21 1400

## 2021-07-29 NOTE — ED Provider Notes (Addendum)
[place] EMERGENCY DEPARTMENT Provider Note  CSN: [id number] Arrival date & time: 07/29/21 0854    History Chief Complaint  Patient presents with   Fall   Leg Pain    [name] [name] is a 55 y.o. male with history of ESRD on HD TThSa who reports he tripped and fell yesterday afternoon after leaving dialysis, landing on his L side. No head injury or LOC. He has had increasing pain and swelling to his L anterior thigh since then. He has been able to bear weight but pain is markedly increased with flexing his hip to walk, especially up stairs. He is not on blood thinners but he does get heparin with dialysis.    Past Medical History:  Diagnosis Date   Bacteremia    Diabetes mellitus    Dialysis patient [facility])    Hypertension    Pneumonia 02/2012   Strep pneumoniae bilateral pneumonia complicated by bacteremia   Renal disorder    End stage, not on dialysis    Past Surgical History:  Procedure Laterality Date   AMPUTATION TOE     AV FISTULA PLACEMENT Left 05/22/2018   Procedure: Creation of BrachiaCephalic Fistula Left Arm;  Surgeon: Angelia Mould, MD;  Location: [place];  Service: Vascular;  Laterality: Left;   [place] Left 11/12/2018   Procedure: BASILIC VEIN TRANSPOSITION LEFT ARM;  Surgeon: Serafina Mitchell, MD;  Location: [place];  Service: Vascular;  Laterality: Left;   CHEST TUBE INSERTION     placed during hospitalization 02/2012   CHOLECYSTECTOMY N/A 06/25/2021   Procedure: LAPAROSCOPIC CHOLECYSTECTOMY;  Surgeon: Stark Klein, MD;  Location: [place];  Service: General;  Laterality: N/A;   COLONOSCOPY  04/01/2012   Procedure: COLONOSCOPY;  Surgeon: Juanita Craver, MD;  Location: [place] ENDOSCOPY;  Service: Endoscopy;  Laterality: N/A;   ERCP N/A 06/24/2021   Procedure: ENDOSCOPIC RETROGRADE CHOLANGIOPANCREATOGRAPHY (ERCP);  Surgeon: Clarene Essex, MD;  Location: [place];  Service: Endoscopy;  Laterality: N/A;   ESOPHAGOGASTRODUODENOSCOPY   03/31/2012   Procedure: ESOPHAGOGASTRODUODENOSCOPY (EGD);  Surgeon: Beryle Beams, MD;  Location: [facility] ENDOSCOPY;  Service: Endoscopy;  Laterality: N/A;   EXCHANGE OF A DIALYSIS CATHETER Right 05/22/2018   Procedure: EXCHANGE OF A DIALYSIS CATHETER;  Surgeon: Angelia Mould, MD;  Location: [place];  Service: Vascular;  Laterality: Right;   IR FLUORO GUIDE CV LINE RIGHT  05/20/2018   IR THROMBECTOMY AV FISTULA W/THROMBOLYSIS INC/SHUNT/IMG LEFT Left 09/10/2018   IR US GUIDE VASC ACCESS LEFT  09/10/2018   IR US GUIDE VASC ACCESS RIGHT  05/20/2018   PANCREATIC STENT PLACEMENT  06/24/2021   Procedure: PANCREATIC STENT PLACEMENT;  Surgeon: Clarene Essex, MD;  Location: [place];  Service: Endoscopy;;   REMOVAL OF STONES  06/24/2021   Procedure: REMOVAL OF STONES;  Surgeon: Clarene Essex, MD;  Location: [place];  Service: Endoscopy;;   SPHINCTEROTOMY  06/24/2021   Procedure: Joan Mayans;  Surgeon: Clarene Essex, MD;  Location: [facility] ENDOSCOPY;  Service: Endoscopy;;   STENT REMOVAL  06/24/2021   Procedure: STENT REMOVAL;  Surgeon: Clarene Essex, MD;  Location: [facility] ENDOSCOPY;  Service: Endoscopy;;    Family History  Problem Relation Age of Onset   Diabetes Maternal Grandmother     Social History   Tobacco Use   Smoking status: Never   Smokeless tobacco: Never  Substance Use Topics   Alcohol use: Yes    Comment: rare    Drug use: No     Home Medications Prior to  Admission medications   Medication Sig Start Date End Date Taking? Authorizing Provider  aspirin 81 MG tablet Take 81 mg by mouth at bedtime.    [provider]  b complex-vitamin c-folic acid (NEPHRO-VITE) 0.8 MG TABS tablet Take 1 tablet by mouth daily.    [provider]  Blood Glucose Monitoring Suppl ([place]) w/Device KIT Use to check blood sugar twice daily. Patient taking differently: 1 each by Other route 2 (two) times daily. 04/21/20   Elayne Snare, MD  calcium elemental as carbonate  (BARIATRIC TUMS ULTRA) 400 MG chewable tablet Chew 2,000 mg by mouth 3 (three) times daily.    [provider]  carvedilol (COREG) 25 MG tablet Take 1.5 tablets (37.5 mg total) by mouth 2 (two) times daily with a meal. 03/25/17   Troy Sine, MD  Continuous Blood Gluc Receiver (FREESTYLE LIBRE 14 DAY READER) DEVI 1 each by Does not apply route See admin instructions. Use to monitor blood sugars. 03/13/20   Elayne Snare, MD  Continuous Blood Gluc Sensor (FREESTYLE LIBRE 2 SENSOR) MISC USE AS DIRECTED Patient taking differently: 1 each by Other route as directed. 01/30/20   Elayne Snare, MD  ethyl chloride spray Apply 1 application topically 3 (three) times a week. Apply 1 spray to skin three times weekly.    [provider]  gabapentin (NEURONTIN) 100 MG capsule Take 100 mg by mouth 3 (three) times daily as needed (nerve pain).    [provider]  glucose blood test strip Use Onetouch verio test strips as instructed to check blood sugar twice daily. Patient taking differently: by Other route See admin instructions. Use Onetouch verio test strips as instructed to check blood sugar twice daily. 04/21/20   Elayne Snare, MD  insulin aspart (NOVOLOG FLEXPEN) 100 UNIT/ML FlexPen 5 to 7 units for breakfast and lunch and 10 to 14 units at dinner based on meal size and carbohydrateS Patient taking differently: Inject 5-7 Units into the skin See admin instructions. 5 to 7 units for breakfast and lunch and 10 to 14 units at dinner based on meal size and carbohydrateS 12/10/19   Elayne Snare, MD  insulin degludec (TRESIBA FLEXTOUCH) 100 UNIT/ML FlexTouch Pen Inject 0.38 mLs (38 Units total) into the skin daily. 04/12/20   Elayne Snare, MD  ivabradine (CORLANOR) 5 MG TABS tablet Take 5 mg by mouth 2 (two) times daily with a meal.    [provider]  lanthanum (FOSRENOL) 1000 MG chewable tablet Chew 1 tablet (1,000 mg total) by mouth 3 (three) times daily with meals. Patient taking  differently: Chew 2,000 mg by mouth 3 (three) times daily with meals. 05/25/18   Domenic Polite, MD  menthol-cetylpyridinium (CEPACOL) 3 MG lozenge Take 1 lozenge (3 mg total) by mouth as needed for sore throat. 06/26/21   Lavina Hamman, MD  OneTouch Delica Lancets 67R MISC Use onetouch delica lancets to check blood sugar twice daily. Patient taking differently: 1 each by Other route 2 (two) times daily. 04/21/20   Elayne Snare, MD  sacubitril-valsartan (ENTRESTO) 49-51 MG Take 1 tablet by mouth 2 (two) times daily.    [provider]  Vitamin D, Ergocalciferol, (DRISDOL) 1.25 MG (50000 UNIT) CAPS capsule Take 50,000 Units by mouth every 7 (seven) days.    [provider]     Allergies    Penicillins   Review of Systems   Review of Systems A comprehensive review of systems was completed and negative except as  noted in HPI.    Physical Exam BP (!) 161/92   Pulse 69   Temp 98.6 F (37 C)   Resp 15   Ht _0  (1.88 m)   Wt 89.8 kg   SpO2 99%   BMI 25.42 kg/m   Physical Exam Vitals and nursing note reviewed.  Constitutional:      Appearance: Normal appearance.  HENT:     Head: Normocephalic and atraumatic.     Nose: Nose normal.     Mouth/Throat:     Mouth: Mucous membranes are moist.  Eyes:     Extraocular Movements: Extraocular movements intact.     Conjunctiva/sclera: Conjunctivae normal.  Cardiovascular:     Rate and Rhythm: Normal rate.  Pulmonary:     Effort: Pulmonary effort is normal.     Breath sounds: Normal breath sounds.  Abdominal:     General: Abdomen is flat.     Palpations: Abdomen is soft.     Tenderness: There is no abdominal tenderness.  Musculoskeletal:        General: Swelling, tenderness (L anterior thigh) and signs of injury present. Normal range of motion.     Cervical back: Neck supple.     Comments: Dialysis fistula in LUE with palpable thrill; distal pulse in LLE is intact  Skin:    General: Skin is warm and dry.   Neurological:     General: No focal deficit present.     Mental Status: He is alert.  Psychiatric:        Mood and Affect: Mood normal.     ED Results / Procedures / Treatments   Labs (all labs ordered are listed, but only abnormal results are displayed) Labs Reviewed  BASIC METABOLIC PANEL - Abnormal; Notable for the following components:      Result Value   Potassium 5.3 (*)    Glucose, Bld 200 (*)    BUN 37 (*)    Creatinine, Ser 5.77 (*)    GFR, Estimated 11 (*)    All other components within normal limits  CBC WITH DIFFERENTIAL/PLATELET - Abnormal; Notable for the following components:   RBC 3.25 (*)    Hemoglobin 10.1 (*)    HCT 32.0 (*)    All other components within normal limits  RESP PANEL BY RT-PCR (FLU A&B, COVID) ARPGX2  CK    EKG None   Radiology CT FEMUR LEFT W CONTRAST  Result Date: 07/29/2021 CLINICAL DATA:  Tripped and fell yesterday. Left thigh pain and swelling. EXAM: CT OF THE LOWER RIGHT EXTREMITY WITH CONTRAST TECHNIQUE: Multidetector CT imaging of the lower right extremity was performed according to the standard protocol following intravenous contrast administration. CONTRAST:  5m OMNIPAQUE IOHEXOL 300 MG/ML  SOLN COMPARISON:  Radiographs, same date. FINDINGS: The left femur is intact. No acute fracture. The left hip and knee joints are maintained. No hip or knee fractures. There is a fairly extensive intramuscular hematoma involving the quadriceps compartment extending all the way down to the distal femur. The major vascular structures are unremarkable. Scattered calcifications. No active extravasation of contrast material. IMPRESSION: 1. Fairly extensive intramuscular hematoma involving the quadriceps compartment. 2. No acute fracture. Electronically Signed   By: PMarijo SanesM.D.   On: 07/29/2021 11:45   DG FEMUR PORT MIN 2 VIEWS LEFT  Result Date: 07/29/2021 CLINICAL DATA:  Trip and fall yesterday, upper thigh pain EXAM: LEFT FEMUR PORTABLE 2  VIEWS COMPARISON:  None. FINDINGS: There is no evidence of fracture or  other focal bone lesions. Soft tissues are unremarkable. IMPRESSION: No fracture or dislocation of the left femur. Electronically Signed   By: Eddie Candle M.D.   On: 07/29/2021 09:58    Procedures .Critical Care  Date/Time: 07/29/2021 1:19 PM Performed by: Truddie Hidden, MD Authorized by: Truddie Hidden, MD   Critical care provider statement:    Critical care time (minutes):  50   Critical care time was exclusive of:  Separately billable procedures and treating other patients   Critical care was necessary to treat or prevent imminent or life-threatening deterioration of the following conditions: compartment syndrome.   Critical care was time spent personally by me on the following activities:  Development of treatment plan with patient or surrogate, discussions with consultants, evaluation of patient's response to treatment, examination of patient, obtaining history from patient or surrogate, ordering and performing treatments and interventions, ordering and review of laboratory studies, ordering and review of radiographic studies, pulse oximetry, re-evaluation of patient's condition and review of old charts  Medications Ordered in the ED Medications  fentaNYL (SUBLIMAZE) injection 50 mcg (50 mcg Intravenous Given 07/29/21 1030)  iohexol (OMNIPAQUE) 300 MG/ML solution 75 mL (75 mLs Intravenous Contrast Given 07/29/21 1100)     MDM Rules/Calculators/A&P MDM Patient's L anterior thigh compartment is tight. Will check xray first. May need additional imaging studies if no signs of fracture.   ED Course  I have reviewed the triage vital signs and the nursing notes.  Pertinent labs & imaging results that were available during my care of the patient were reviewed by me and considered in my medical decision making (see chart for details).  Clinical Course as of 07/29/21 1420  Sun Jul 29, 2021  1007 Femur xray images  and results reviewed, no signs of bony injury. Will send for CT to evaluate soft tissue injury/hematoma. No distal pain/tingling to suggest significant compartment syndrome yet. Spoke with Dr. Posey Pronto, Nephrology who has no objection to giving contrast to this patient given his long standing ESRD.  [CS]  0354 CBC with anemia, improved from baseline. No signs of significant blood loss.  [CS]  6568 BMP consistent with ESRD.  [CS]  1275 CT images and results reviewed. Large hematoma in muscle. Will discuss with Ortho [CS]  1700 Spoke with Dr. Tamera Punt, Ortho. Given patient's inability to passively flex the leg, he is also concerned about compartment syndrome. He requests the patient be sent to the [facility] ED where he can evaluate him. Dr. Hillard Danker, EDP also aware. Discussed this plan with patient and wife at bedside who are in agreement.  [CS]    Clinical Course User Index [CS] Truddie Hidden, MD    Final Clinical Impression(s) / ED Diagnoses Final diagnoses:  Hematoma of left thigh, initial encounter  Traumatic compartment syndrome of left lower extremity, initial encounter [facility])    Rx / DC Orders ED Discharge Orders     None        Truddie Hidden, MD 07/29/21 1319    Truddie Hidden, MD 07/29/21 1420

## 2021-07-29 NOTE — ED Notes (Signed)
Patient transported to CT 

## 2021-07-29 NOTE — ED Provider Notes (Signed)
Patient evaluated by orthopedics.  They did compartment pressures to the left thigh.  No elevation no evidence of compartment syndrome.  They have given the greenlight for patient to be discharged home and follow-up with Dr. Marlou Sa from orthopedics as an outpatient this week.   Fredia Sorrow, MD 07/29/21 [phone]

## 2021-07-29 NOTE — ED Notes (Signed)
[facility] given report and Roselyn Reef at [facility] ED

## 2021-07-29 NOTE — ED Triage Notes (Signed)
Pt tripped and fell yesterday. Fell on pavement. Pain in left upper thigh, painful to lie down, noted swelling to left upper thigh. Pt is a dialysis pt on Tues-Thurs-Sat.

## 2021-08-15 ENCOUNTER — Ambulatory Visit: Payer: 59 | Admitting: Orthopedic Surgery

## 2021-12-06 ENCOUNTER — Observation Stay (HOSPITAL_BASED_OUTPATIENT_CLINIC_OR_DEPARTMENT_OTHER)
Admission: EM | Admit: 2021-12-06 | Discharge: 2021-12-08 | Disposition: A | Discharge status: Home / Self Care | Payer: Medicare Other | Attending: Internal Medicine | Admitting: Internal Medicine

## 2021-12-06 ENCOUNTER — Other Ambulatory Visit: Payer: Self-pay

## 2021-12-06 ENCOUNTER — Encounter (HOSPITAL_BASED_OUTPATIENT_CLINIC_OR_DEPARTMENT_OTHER): Payer: Self-pay

## 2021-12-06 DIAGNOSIS — Z20822 Contact with and (suspected) exposure to covid-19: Secondary | ICD-10-CM | POA: Insufficient documentation

## 2021-12-06 DIAGNOSIS — E1169 Type 2 diabetes mellitus with other specified complication: Secondary | ICD-10-CM | POA: Diagnosis not present

## 2021-12-06 DIAGNOSIS — I5042 Chronic combined systolic (congestive) and diastolic (congestive) heart failure: Secondary | ICD-10-CM | POA: Diagnosis not present

## 2021-12-06 DIAGNOSIS — Z992 Dependence on renal dialysis: Secondary | ICD-10-CM

## 2021-12-06 DIAGNOSIS — Z794 Long term (current) use of insulin: Secondary | ICD-10-CM

## 2021-12-06 DIAGNOSIS — K3580 Unspecified acute appendicitis: Principal | ICD-10-CM | POA: Diagnosis present

## 2021-12-06 DIAGNOSIS — I132 Hypertensive heart and chronic kidney disease with heart failure and with stage 5 chronic kidney disease, or end stage renal disease: Secondary | ICD-10-CM | POA: Insufficient documentation

## 2021-12-06 DIAGNOSIS — E119 Type 2 diabetes mellitus without complications: Secondary | ICD-10-CM

## 2021-12-06 DIAGNOSIS — E785 Hyperlipidemia, unspecified: Secondary | ICD-10-CM | POA: Insufficient documentation

## 2021-12-06 DIAGNOSIS — N186 End stage renal disease: Secondary | ICD-10-CM

## 2021-12-06 DIAGNOSIS — K37 Unspecified appendicitis: Secondary | ICD-10-CM

## 2021-12-06 LAB — CBC
HCT: 32.4 % — ABNORMAL LOW (ref 39.0–52.0)
Hemoglobin: 10.3 g/dL — ABNORMAL LOW (ref 13.0–17.0)
MCH: 31 pg (ref 26.0–34.0)
MCHC: 31.8 g/dL (ref 30.0–36.0)
MCV: 97.6 fL (ref 80.0–100.0)
Platelets: 131 10*3/uL — ABNORMAL LOW (ref 150–400)
RBC: 3.32 MIL/uL — ABNORMAL LOW (ref 4.22–5.81)
RDW: 14.4 % (ref 11.5–15.5)
WBC: 12 10*3/uL — ABNORMAL HIGH (ref 4.0–10.5)
nRBC: 0 % (ref 0.0–0.2)

## 2021-12-06 LAB — COMPREHENSIVE METABOLIC PANEL
ALT: 49 U/L — ABNORMAL HIGH (ref 0–44)
AST: 27 U/L (ref 15–41)
Albumin: 3.6 g/dL (ref 3.5–5.0)
Alkaline Phosphatase: 110 U/L (ref 38–126)
Anion gap: 9 (ref 5–15)
BUN: 26 mg/dL — ABNORMAL HIGH (ref 6–20)
CO2: 30 mmol/L (ref 22–32)
Calcium: 8.8 mg/dL — ABNORMAL LOW (ref 8.9–10.3)
Chloride: 96 mmol/L — ABNORMAL LOW (ref 98–111)
Creatinine, Ser: 4.5 mg/dL — ABNORMAL HIGH (ref 0.61–1.24)
GFR, Estimated: 15 mL/min — ABNORMAL LOW (ref >60)
Glucose, Bld: 151 mg/dL — ABNORMAL HIGH (ref 70–99)
Potassium: 4 mmol/L (ref 3.5–5.1)
Sodium: 135 mmol/L (ref 135–145)
Total Bilirubin: 0.9 mg/dL (ref 0.3–1.2)
Total Protein: 6.9 g/dL (ref 6.5–8.1)

## 2021-12-06 LAB — CBG MONITORING, ED: Glucose-Capillary: 118 mg/dL — ABNORMAL HIGH (ref 70–99)

## 2021-12-06 LAB — LIPASE, BLOOD: Lipase: 11 U/L (ref 11–51)

## 2021-12-06 LAB — RESP PANEL BY RT-PCR (FLU A&B, COVID) ARPGX2
Influenza A by PCR: NEGATIVE
Influenza B by PCR: NEGATIVE
SARS Coronavirus 2 by RT PCR: NEGATIVE

## 2021-12-06 MED ORDER — SODIUM CHLORIDE 0.9 % IV SOLN
1.0000 g | INTRAVENOUS | Status: DC
  Administered 2021-12-07: 1 g via INTRAVENOUS
  Filled 2021-12-06: qty 1

## 2021-12-06 MED ORDER — ACETAMINOPHEN 650 MG RE SUPP: 650.0000 mg | Freq: Four times a day (QID) | RECTAL | Status: DC | PRN

## 2021-12-06 MED ORDER — ACETAMINOPHEN 325 MG PO TABS: 650.0000 mg | ORAL_TABLET | Freq: Four times a day (QID) | ORAL | Status: DC | PRN

## 2021-12-06 MED ORDER — METRONIDAZOLE 500 MG/100ML IV SOLN
500.0000 mg | Freq: Once | INTRAVENOUS | Status: AC
  Administered 2021-12-06: 500 mg via INTRAVENOUS
  Filled 2021-12-06: qty 100

## 2021-12-06 MED ORDER — INSULIN GLARGINE-YFGN 100 UNIT/ML ~~LOC~~ SOLN
18.0000 [IU] | Freq: Every day | SUBCUTANEOUS | Status: DC
  Administered 2021-12-07: 18 [IU] via SUBCUTANEOUS
  Filled 2021-12-06 (×2): qty 0.18

## 2021-12-06 MED ORDER — METRONIDAZOLE 500 MG/100ML IV SOLN
500.0000 mg | Freq: Two times a day (BID) | INTRAVENOUS | Status: DC
  Administered 2021-12-07: 500 mg via INTRAVENOUS
  Filled 2021-12-06: qty 100

## 2021-12-06 MED ORDER — CALCIUM CARBONATE ANTACID 500 MG PO CHEW
2000.0000 mg | CHEWABLE_TABLET | Freq: Three times a day (TID) | ORAL | Status: DC
  Administered 2021-12-07 (×2): 2000 mg via ORAL
  Filled 2021-12-06: qty 10

## 2021-12-06 MED ORDER — SODIUM CHLORIDE 0.9 % IV SOLN: 2.0000 g | Freq: Two times a day (BID) | INTRAVENOUS | Status: DC

## 2021-12-06 MED ORDER — SODIUM CHLORIDE 0.9 % IV SOLN
2.0000 g | Freq: Once | INTRAVENOUS | Status: AC
  Administered 2021-12-06: 2 g via INTRAVENOUS
  Filled 2021-12-06: qty 20

## 2021-12-06 MED ORDER — IVABRADINE HCL 5 MG PO TABS
5.0000 mg | ORAL_TABLET | Freq: Two times a day (BID) | ORAL | Status: DC
  Administered 2021-12-07 – 2021-12-08 (×2): 5 mg via ORAL
  Filled 2021-12-06 (×3): qty 1

## 2021-12-06 MED ORDER — CARVEDILOL 25 MG PO TABS
37.5000 mg | ORAL_TABLET | Freq: Two times a day (BID) | ORAL | Status: DC
  Administered 2021-12-07 – 2021-12-08 (×3): 37.5 mg via ORAL
  Filled 2021-12-06: qty 1
  Filled 2021-12-06: qty 3
  Filled 2021-12-06: qty 1

## 2021-12-06 MED ORDER — INSULIN ASPART 100 UNIT/ML IJ SOLN: 0.0000 [IU] | INTRAMUSCULAR | Status: DC

## 2021-12-06 MED ORDER — SACUBITRIL-VALSARTAN 49-51 MG PO TABS
1.0000 | ORAL_TABLET | Freq: Two times a day (BID) | ORAL | Status: DC
  Administered 2021-12-07 – 2021-12-08 (×3): 1 via ORAL
  Filled 2021-12-06 (×4): qty 1

## 2021-12-06 MED ORDER — LACTATED RINGERS IV SOLN: INTRAVENOUS | Status: DC

## 2021-12-06 MED ORDER — FENTANYL CITRATE PF 50 MCG/ML IJ SOSY: 25.0000 ug | PREFILLED_SYRINGE | INTRAMUSCULAR | Status: DC | PRN

## 2021-12-06 MED ORDER — LANTHANUM CARBONATE 500 MG PO CHEW
2000.0000 mg | CHEWABLE_TABLET | Freq: Three times a day (TID) | ORAL | Status: DC
  Administered 2021-12-07 – 2021-12-08 (×2): 2000 mg via ORAL
  Filled 2021-12-06 (×6): qty 4

## 2021-12-06 NOTE — ED Provider Notes (Signed)
Care of the patient assumed on transfer from [facility] with CT showing appendicitis. He is also an ESRD HD (TTS) patient. He had dialysis earlier today before having an outpatient CT.  Physical Exam  BP (!) 165/82    Pulse 69    Temp 98.1 F (36.7 C) (Oral)    Resp 17    SpO2 97%   Physical Exam Tender RLQ ED Course/Procedures     Procedures  MDM  Spoke with Dr. Redmond Pulling, Gen Surg, who will come evaluate the patient. Requests admission to medicine and Nephrology consult.   9:29 PM Spoke with Dr. Royce Macadamia, Nephrology, who will consult for dialysis needs. Spoke with Dr. Myna Hidalgo, hospitalist, who will evaluate for admission.        Truddie Hidden, MD 12/06/21 2258

## 2021-12-06 NOTE — ED Triage Notes (Signed)
Pt sent here by PCP for appendicitis. Pt having abd pain, N/V x2 days. PCP sent him to have a CT scan today, showed appendicitis. Pt came here d/t wait time [facility].

## 2021-12-06 NOTE — H&P (Signed)
History and Physical    Estevan Kersh Broder ZOX:[id number] DOB: [birth year] DOA: 12/06/2021  PCP: [name], PA-C   Patient coming from: Home   Chief Complaint: Abdominal pain, chills, appendicitis on outpatient CT   HPI: [name] [name] is a pleasant 55 y.o. male with medical history significant for ESRD on hemodialysis, insulin-dependent diabetes mellitus, chronic combined systolic and diastolic CHF, now presenting to the emergency department with abdominal pain, chills, and outpatient CT concerning for acute appendicitis.  Patient reports almost a week of pain across the lower abdomen that can become severe at times.  He went on to develop subjective fever and chills and then nausea with a couple episodes of vomiting since yesterday.  He was seen in an outpatient clinic for this, was sent for CT, had findings concerning for acute appendicitis, and was then directed to the ED. reports that he completed dialysis today.  ED Course: Upon arrival to the ED, patient is found to be afebrile, saturating well on room air, and with stable blood pressure.  Chemistry panel with normal potassium, normal bicarbonate, and BUN 26.  CBC with leukocytosis to 12,000, hemoglobin 10.3, and platelets 131,000.  Surgery and nephrology were s consulted by the ED physician, patient was treated with Rocephin and Flagyl, and hospitalists asked to admit.  Review of Systems:  All other systems reviewed and apart from HPI, are negative.  Past Medical History:  Diagnosis Date   Bacteremia    Diabetes mellitus    Dialysis patient [facility])    Hypertension    Pneumonia 02/2012   Strep pneumoniae bilateral pneumonia complicated by bacteremia   Renal disorder    End stage, not on dialysis    Past Surgical History:  Procedure Laterality Date   AMPUTATION TOE     AV FISTULA PLACEMENT Left 05/22/2018   Procedure: Creation of BrachiaCephalic Fistula Left Arm;  Surgeon: Angelia Mould, MD;   Location: [place];  Service: Vascular;  Laterality: Left;   [place] Left 11/12/2018   Procedure: BASILIC VEIN TRANSPOSITION LEFT ARM;  Surgeon: Serafina Mitchell, MD;  Location: [place];  Service: Vascular;  Laterality: Left;   CHEST TUBE INSERTION     placed during hospitalization 02/2012   CHOLECYSTECTOMY N/A 06/25/2021   Procedure: LAPAROSCOPIC CHOLECYSTECTOMY;  Surgeon: Stark Klein, MD;  Location: [place];  Service: General;  Laterality: N/A;   COLONOSCOPY  04/01/2012   Procedure: COLONOSCOPY;  Surgeon: Juanita Craver, MD;  Location: [place] ENDOSCOPY;  Service: Endoscopy;  Laterality: N/A;   ERCP N/A 06/24/2021   Procedure: ENDOSCOPIC RETROGRADE CHOLANGIOPANCREATOGRAPHY (ERCP);  Surgeon: Clarene Essex, MD;  Location: [place];  Service: Endoscopy;  Laterality: N/A;   ESOPHAGOGASTRODUODENOSCOPY  03/31/2012   Procedure: ESOPHAGOGASTRODUODENOSCOPY (EGD);  Surgeon: Beryle Beams, MD;  Location: [facility] ENDOSCOPY;  Service: Endoscopy;  Laterality: N/A;   EXCHANGE OF A DIALYSIS CATHETER Right 05/22/2018   Procedure: EXCHANGE OF A DIALYSIS CATHETER;  Surgeon: Angelia Mould, MD;  Location: [place];  Service: Vascular;  Laterality: Right;   IR FLUORO GUIDE CV LINE RIGHT  05/20/2018   IR THROMBECTOMY AV FISTULA W/THROMBOLYSIS INC/SHUNT/IMG LEFT Left 09/10/2018   IR US GUIDE VASC ACCESS LEFT  09/10/2018   IR US GUIDE VASC [place] RIGHT  05/20/2018   PANCREATIC STENT PLACEMENT  06/24/2021   Procedure: PANCREATIC STENT PLACEMENT;  Surgeon: Clarene Essex, MD;  Location: [place];  Service: Endoscopy;;   REMOVAL OF STONES  06/24/2021   Procedure: REMOVAL OF STONES;  Surgeon:  Clarene Essex, MD;  Location: [facility] ENDOSCOPY;  Service: Endoscopy;;   Joan Mayans  06/24/2021   Procedure: Joan Mayans;  Surgeon: Clarene Essex, MD;  Location: [facility] ENDOSCOPY;  Service: Endoscopy;;   STENT REMOVAL  06/24/2021   Procedure: STENT REMOVAL;  Surgeon: Clarene Essex, MD;  Location: [place];  Service: Endoscopy;;    Social  History:   reports that he has never smoked. He has never used smokeless tobacco. He reports current alcohol use. He reports that he does not use drugs.  Allergies  Allergen Reactions   Penicillins Rash and Other (See Comments)    Tolerating Ceftriaxone (03/12/12) Has patient had a PCN reaction causing immediate rash, facial/tongue/throat swelling, SOB or lightheadedness with hypotension: Yes Has patient had a PCN reaction causing severe rash involving mucus membranes or skin necrosis: Unk Has patient had a PCN reaction that required hospitalization: No Has patient had a PCN reaction occurring within the last 10 years: No If all of the above answers are "NO", then may proceed with Cephalosporin use.     Family History  Problem Relation Age of Onset   Diabetes Maternal Grandmother      Prior to Admission medications   Medication Sig Start Date End Date Taking? Authorizing Provider  Ascorbic Acid (VITAMIN C) 1000 MG tablet Take 1,000 mg by mouth daily.   Yes [provider]  aspirin 81 MG tablet Take 81 mg by mouth at bedtime.   Yes [provider]  b complex-vitamin c-folic acid (NEPHRO-VITE) 0.8 MG TABS tablet Take 1 tablet by mouth daily.   Yes [provider]  calcium elemental as carbonate (BARIATRIC TUMS ULTRA) 400 MG chewable tablet Chew 2,000 mg by mouth 3 (three) times daily.   Yes [provider]  carvedilol (COREG) 25 MG tablet Take 1.5 tablets (37.5 mg total) by mouth 2 (two) times daily with a meal. 03/25/17  Yes Troy Sine, MD  insulin aspart (NOVOLOG FLEXPEN) 100 UNIT/ML FlexPen 5 to 7 units for breakfast and lunch and 10 to 14 units at dinner based on meal size and carbohydrateS Patient taking differently: Inject 5-14 Units into the skin See admin instructions. 5 to 7 units for breakfast and lunch and 10 to 14 units at dinner based on meal size and carbohydrates. Sliding scale 12/10/19  Yes Elayne Snare, MD  insulin degludec (TRESIBA  FLEXTOUCH) 100 UNIT/ML FlexTouch Pen Inject 0.38 mLs (38 Units total) into the skin daily. 04/12/20  Yes Elayne Snare, MD  ivabradine (CORLANOR) 5 MG TABS tablet Take 5 mg by mouth 2 (two) times daily with a meal.   Yes [provider]  lanthanum (FOSRENOL) 1000 MG chewable tablet Chew 1 tablet (1,000 mg total) by mouth 3 (three) times daily with meals. Patient taking differently: Chew 2,000 mg by mouth 3 (three) times daily with meals. 05/25/18  Yes Domenic Polite, MD  sacubitril-valsartan (ENTRESTO) 49-51 MG Take 1 tablet by mouth 2 (two) times daily.   Yes [provider]  Blood Glucose Monitoring Suppl ([place]) w/Device KIT Use to check blood sugar twice daily. Patient taking differently: 1 each by Other route 2 (two) times daily. 04/21/20   Elayne Snare, MD  Continuous Blood Gluc Receiver (FREESTYLE LIBRE 14 DAY READER) [place] 1 each by Does not apply route See admin instructions. Use to monitor blood sugars. 03/13/20   Elayne Snare, MD  Continuous Blood Gluc Sensor (FREESTYLE LIBRE 2 SENSOR) MISC USE AS DIRECTED Patient taking differently: 1 each by Other route as  directed. 01/30/20   Elayne Snare, MD  glucose blood test strip Use Onetouch verio test strips as instructed to check blood sugar twice daily. Patient taking differently: by Other route See admin instructions. Use Onetouch verio test strips as instructed to check blood sugar twice daily. 04/21/20   Elayne Snare, MD  menthol-cetylpyridinium (CEPACOL) 3 MG lozenge Take 1 lozenge (3 mg total) by mouth as needed for sore throat. Patient not taking: Reported on 12/06/2021 06/26/21   Lavina Hamman, MD  OneTouch Delica Lancets 50Y MISC Use onetouch delica lancets to check blood sugar twice daily. Patient taking differently: 1 each by Other route 2 (two) times daily. 04/21/20   Elayne Snare, MD    Physical Exam: Vitals:   12/06/21 1844 12/06/21 1911 12/06/21 1930 12/06/21 2022  BP: (!) 153/74 (!) 152/79 (!) 159/78  (!) 165/82  Pulse: 66 71 69 69  Resp: _0 Temp: 98.3 F (36.8 C)   98.1 F (36.7 C)  TempSrc: Oral   Oral  SpO2: 98% 97% 98% 97%    Constitutional: NAD, calm  Eyes: PERTLA, lids and conjunctivae normal ENMT: Mucous membranes are moist. Posterior pharynx clear of any exudate or lesions.   Neck: supple, no masses  Respiratory: no wheezing, no crackles. No accessory muscle use.  Cardiovascular: S1 & S2 heard, regular rate and rhythm. No extremity edema.   Abdomen: No distension, soft, tender in lower abdomen without guarding. Bowel sounds active.  Musculoskeletal: no clubbing / cyanosis. No joint deformity upper and lower extremities.   Skin: no significant rashes, lesions, ulcers. Warm, dry, well-perfused. Neurologic: CN 2-12 grossly intact. Moving all extremities. Alert and oriented.  Psychiatric: Very pleasant. Cooperative.    Labs and Imaging on Admission: I have personally reviewed following labs and imaging studies  CBC: Recent Labs  Lab 12/06/21 1920  WBC 12.0*  HGB 10.3*  HCT 32.4*  MCV 97.6  PLT 774*   Basic Metabolic Panel: Recent Labs  Lab 12/06/21 1920  NA 135  K 4.0  CL 96*  CO2 30  GLUCOSE 151*  BUN 26*  CREATININE 4.50*  CALCIUM 8.8*   GFR: CrCl cannot be calculated (Unknown ideal weight.). Liver Function Tests: Recent Labs  Lab 12/06/21 1920  AST 27  ALT 49*  ALKPHOS 110  BILITOT 0.9  PROT 6.9  ALBUMIN 3.6   Recent Labs  Lab 12/06/21 1920  LIPASE 11   No results for input(s): AMMONIA in the last 168 hours. Coagulation Profile: No results for input(s): INR, PROTIME in the last 168 hours. Cardiac Enzymes: No results for input(s): CKTOTAL, CKMB, CKMBINDEX, TROPONINI in the last 168 hours. BNP (last 3 results) No results for input(s): PROBNP in the last 8760 hours. HbA1C: No results for input(s): HGBA1C in the last 72 hours. CBG: No results for input(s): GLUCAP in the last 168 hours. Lipid Profile: No results for input(s):  CHOL, HDL, LDLCALC, TRIG, CHOLHDL, LDLDIRECT in the last 72 hours. Thyroid Function Tests: No results for input(s): TSH, T4TOTAL, FREET4, T3FREE, THYROIDAB in the last 72 hours. Anemia Panel: No results for input(s): VITAMINB12, FOLATE, FERRITIN, TIBC, IRON, RETICCTPCT in the last 72 hours. Urine analysis:    Component Value Date/Time   COLORURINE YELLOW 05/20/2018 0711   APPEARANCEUR HAZY (A) 05/20/2018 0711   LABSPEC 1.015 05/20/2018 0711   PHURINE 5.5 05/20/2018 0711   GLUCOSEU NEGATIVE 05/20/2018 0711   HGBUR SMALL (A) 05/20/2018 0711   BILIRUBINUR NEGATIVE 05/20/2018 0711   BILIRUBINUR negative 04/20/2018  [place] 05/20/2018 0711   PROTEINUR 100 (A) 05/20/2018 0711   UROBILINOGEN 0.2 04/20/2018 1452   UROBILINOGEN 0.2 04/09/2012 2229   NITRITE NEGATIVE 05/20/2018 0711   LEUKOCYTESUR NEGATIVE 05/20/2018 0711   Sepsis Labs: _0 (procalcitonin:4,lacticidven:4) ) Recent Results (from the past 240 hour(s))  Resp Panel by RT-PCR (Flu A&B, Covid) Nasopharyngeal Swab     Status: None   Collection Time: 12/06/21  7:26 PM   Specimen: Nasopharyngeal Swab; Nasopharyngeal(NP) swabs in vial transport medium  Result Value Ref Range Status   SARS Coronavirus 2 by RT PCR NEGATIVE NEGATIVE Final    Comment: (NOTE) SARS-CoV-2 target nucleic acids are NOT DETECTED.  The SARS-CoV-2 RNA is generally detectable in upper respiratory specimens during the acute phase of infection. The lowest concentration of SARS-CoV-2 viral copies this assay can detect is 138 copies/mL. A negative result does not preclude SARS-Cov-2 infection and should not be used as the sole basis for treatment or other patient management decisions. A negative result may occur with  improper specimen collection/handling, submission of specimen other than nasopharyngeal swab, presence of viral mutation(s) within the areas targeted by this assay, and inadequate number of viral copies(<138 copies/mL). A  negative result must be combined with clinical observations, patient history, and epidemiological information. The expected result is Negative.  Fact Sheet for Patients:  EntrepreneurPulse.com.au  Fact Sheet for Healthcare Providers:  IncredibleEmployment.be  This test is no t yet approved or cleared by the [place] FDA and  has been authorized for detection and/or diagnosis of SARS-CoV-2 by FDA under an Emergency Use Authorization (EUA). This EUA will remain  in effect (meaning this test can be used) for the duration of the COVID-19 declaration under Section 564(b)(1) of the Act, 21 U.S.C.section 360bbb-3(b)(1), unless the authorization is terminated  or revoked sooner.       Influenza A by PCR NEGATIVE NEGATIVE Final   Influenza B by PCR NEGATIVE NEGATIVE Final    Comment: (NOTE) The Xpert Xpress SARS-CoV-2/FLU/RSV plus assay is intended as an aid in the diagnosis of influenza from Nasopharyngeal swab specimens and should not be used as a sole basis for treatment. Nasal washings and aspirates are unacceptable for Xpert Xpress SARS-CoV-2/FLU/RSV testing.  Fact Sheet for Patients: EntrepreneurPulse.com.au  Fact Sheet for Healthcare Providers: IncredibleEmployment.be  This test is not yet approved or cleared by the [place] FDA and has been authorized for detection and/or diagnosis of SARS-CoV-2 by FDA under an Emergency Use Authorization (EUA). This EUA will remain in effect (meaning this test can be used) for the duration of the COVID-19 declaration under Section 564(b)(1) of the Act, 21 U.S.C. section 360bbb-3(b)(1), unless the authorization is terminated or revoked.  Performed at [facility], [address] [place], [place] [address]      Radiological Exams on Admission: No results found.  Assessment/Plan   1. Acute appendicitis  - Presents with lower  abdominal pain, chills, and outpatient CT concerning for acute appendicitis  - Appreciate surgery consultants, planning for likely laparoscopic appendectomy 12/16  - Continue IV antibiotics, supportive care   2. ESRD  - Completed HD on 12/06/21   - Restrict fluids, renally-dose medications    3. Chronic combined systolic & diastolic CHF  - Appears compensated  - EF was 30-35% with grade 1 diastolic dysfunction on echo from July 2022  - Continue Coreg, ivabradine, and Entresto    4. Insulin-dependent DM  - A1c 7.7% in July 2022   - Continue CBG checks  and insulin    DVT prophylaxis: SCDs  Code Status: Full  Level of Care: Level of care: Med-Surg Family Communication: none present  Disposition Plan:  Patient is from: home  Anticipated d/c is to: home  Anticipated d/c date is: 12/09/21 Patient currently: Pending likely operative management of acute appendicitis  Consults called: Surgery Admission status: Inpatient     Vianne Bulls, MD [facility]  12/06/2021, 11:45 PM

## 2021-12-06 NOTE — ED Notes (Signed)
Called [facility] to transport patient to [facility] Emergency--Dr Karle Starch accepting

## 2021-12-06 NOTE — ED Provider Notes (Addendum)
[facility] EMERGENCY DEPT Provider Note   CSN: [id number] Arrival date & time: 12/06/21  1830     History Chief Complaint  Patient presents with   Abdominal Pain   Nausea   Emesis    [name] [name] is a 55 y.o. male.  55 year old male who presents with 3 days of right lower quadrant abdominal pain with some associated chills.  Had emesis today.  Denies any diarrhea.  Pain is characterizes sharp and constant.  He is a diabetic and has ESRD and was last dialyzed today.  Had an outpatient abdominal CT which was positive for appendicitis.  Came here for further management      Past Medical History:  Diagnosis Date   Bacteremia    Diabetes mellitus    Dialysis patient [facility])    Hypertension    Pneumonia 02/2012   Strep pneumoniae bilateral pneumonia complicated by bacteremia   Renal disorder    End stage, not on dialysis    Patient Active Problem List   Diagnosis Date Noted   Mixed diabetic hyperlipidemia associated with type 2 diabetes mellitus ([place]) 06/23/2021   Abnormal liver enzymes 06/23/2021   Diabetic polyneuropathy associated with type 2 diabetes mellitus ([place]) 06/23/2021   NICM (nonischemic cardiomyopathy) ([place]) 40/98/1191   Chronic systolic heart failure ([place]) 03/06/2021   Elbow pain 07/03/2018   Nausea vomiting and diarrhea 05/20/2018   Acute respiratory failure with hypoxia ([place]) 05/19/2018   Type 2 diabetes mellitus with diabetic polyneuropathy, with long-term current use of insulin ([place]) 05/19/2018   Anemia of chronic disease 05/19/2018   Hypocalcemia 05/19/2018   HCAP (healthcare-associated pneumonia) 05/19/2018   ESRD on dialysis [facility])    CKD (chronic kidney disease), stage V ([place]) 03/02/2018   Chronic diastolic CHF (congestive heart failure) ([place]) 03/02/2018   CAP (community acquired pneumonia) 03/02/2018   AKI (acute kidney injury) ([place]) 01/29/2017   Palpitations 01/13/2017   Essential hypertension 10/26/2013   Dyspnea  06/09/2012   Dizziness 06/09/2012   Pneumonia, organism unspecified([place]) 04/07/2012   Pleural effusion 04/07/2012   Parapneumonic effusion 03/28/2012   Symptomatic anemia 03/28/2012   Hypoalbuminemia 03/28/2012   Total bilirubin, elevated 03/28/2012   Bilateral leg edema 03/23/2012   Cyst of skin 03/23/2012   Bacteremia due to Streptococcus pneumoniae 03/15/2012   Hyponatremia 03/15/2012   Acute kidney failure 03/13/2012   Elevated LFTs 03/13/2012   Pneumococcal pneumonia ([place]) 03/12/2012   Insulin-requiring or dependent type II diabetes mellitus ([place]) 03/12/2012    Past Surgical History:  Procedure Laterality Date   AMPUTATION TOE     AV FISTULA PLACEMENT Left 05/22/2018   Procedure: Creation of BrachiaCephalic Fistula Left Arm;  Surgeon: Angelia Mould, MD;  Location: [facility] OR;  Service: Vascular;  Laterality: Left;   [place] Left 11/12/2018   Procedure: BASILIC VEIN TRANSPOSITION LEFT ARM;  Surgeon: Serafina Mitchell, MD;  Location: [facility] OR;  Service: Vascular;  Laterality: Left;   CHEST TUBE INSERTION     placed during hospitalization 02/2012   CHOLECYSTECTOMY N/A 06/25/2021   Procedure: LAPAROSCOPIC CHOLECYSTECTOMY;  Surgeon: Stark Klein, MD;  Location: [place];  Service: General;  Laterality: N/A;   COLONOSCOPY  04/01/2012   Procedure: COLONOSCOPY;  Surgeon: Juanita Craver, MD;  Location: [facility] ENDOSCOPY;  Service: Endoscopy;  Laterality: N/A;   ERCP N/A 06/24/2021   Procedure: ENDOSCOPIC RETROGRADE CHOLANGIOPANCREATOGRAPHY (ERCP);  Surgeon: Clarene Essex, MD;  Location: [place];  Service: Endoscopy;  Laterality: N/A;   ESOPHAGOGASTRODUODENOSCOPY  03/31/2012   Procedure: ESOPHAGOGASTRODUODENOSCOPY (  EGD);  Surgeon: Beryle Beams, MD;  Location: [place];  Service: Endoscopy;  Laterality: N/A;   EXCHANGE OF A DIALYSIS CATHETER Right 05/22/2018   Procedure: EXCHANGE OF A DIALYSIS CATHETER;  Surgeon: Angelia Mould, MD;  Location: [place];  Service: Vascular;   Laterality: Right;   IR FLUORO GUIDE CV LINE RIGHT  05/20/2018   IR THROMBECTOMY AV FISTULA W/THROMBOLYSIS INC/SHUNT/IMG LEFT Left 09/10/2018   IR US GUIDE VASC ACCESS LEFT  09/10/2018   IR US GUIDE VASC ACCESS RIGHT  05/20/2018   PANCREATIC STENT PLACEMENT  06/24/2021   Procedure: PANCREATIC STENT PLACEMENT;  Surgeon: Clarene Essex, MD;  Location: [place];  Service: Endoscopy;;   REMOVAL OF STONES  06/24/2021   Procedure: REMOVAL OF STONES;  Surgeon: Clarene Essex, MD;  Location: [place];  Service: Endoscopy;;   SPHINCTEROTOMY  06/24/2021   Procedure: Joan Mayans;  Surgeon: Clarene Essex, MD;  Location: [facility] ENDOSCOPY;  Service: Endoscopy;;   STENT REMOVAL  06/24/2021   Procedure: STENT REMOVAL;  Surgeon: Clarene Essex, MD;  Location: [facility] ENDOSCOPY;  Service: Endoscopy;;       Family History  Problem Relation Age of Onset   Diabetes Maternal Grandmother     Social History   Tobacco Use   Smoking status: Never   Smokeless tobacco: Never  Substance Use Topics   Alcohol use: Yes    Comment: rare    Drug use: No    Home Medications Prior to Admission medications   Medication Sig Start Date End Date Taking? Authorizing Provider  aspirin 81 MG tablet Take 81 mg by mouth at bedtime.    [provider]  b complex-vitamin c-folic acid (NEPHRO-VITE) 0.8 MG TABS tablet Take 1 tablet by mouth daily.    [provider]  Blood Glucose Monitoring Suppl ([place]) w/Device KIT Use to check blood sugar twice daily. Patient taking differently: 1 each by Other route 2 (two) times daily. 04/21/20   Elayne Snare, MD  calcium elemental as carbonate (BARIATRIC TUMS ULTRA) 400 MG chewable tablet Chew 2,000 mg by mouth 3 (three) times daily.    [provider]  carvedilol (COREG) 25 MG tablet Take 1.5 tablets (37.5 mg total) by mouth 2 (two) times daily with a meal. 03/25/17   Troy Sine, MD  Continuous Blood Gluc Receiver (FREESTYLE LIBRE 14 DAY READER) DEVI 1  each by Does not apply route See admin instructions. Use to monitor blood sugars. 03/13/20   Elayne Snare, MD  Continuous Blood Gluc Sensor (FREESTYLE LIBRE 2 SENSOR) MISC USE AS DIRECTED Patient taking differently: 1 each by Other route as directed. 01/30/20   Elayne Snare, MD  ethyl chloride spray Apply 1 application topically 3 (three) times a week. Apply 1 spray to skin three times weekly.    [provider]  gabapentin (NEURONTIN) 100 MG capsule Take 100 mg by mouth 3 (three) times daily as needed (nerve pain).    [provider]  glucose blood test strip Use Onetouch verio test strips as instructed to check blood sugar twice daily. Patient taking differently: by Other route See admin instructions. Use Onetouch verio test strips as instructed to check blood sugar twice daily. 04/21/20   Elayne Snare, MD  insulin aspart (NOVOLOG FLEXPEN) 100 UNIT/ML FlexPen 5 to 7 units for breakfast and lunch and 10 to 14 units at dinner based on meal size and carbohydrateS Patient taking differently: Inject 5-7 Units into the skin See admin instructions. 5 to 7  units for breakfast and lunch and 10 to 14 units at dinner based on meal size and carbohydrateS 12/10/19   Elayne Snare, MD  insulin degludec (TRESIBA FLEXTOUCH) 100 UNIT/ML FlexTouch Pen Inject 0.38 mLs (38 Units total) into the skin daily. 04/12/20   Elayne Snare, MD  ivabradine (CORLANOR) 5 MG TABS tablet Take 5 mg by mouth 2 (two) times daily with a meal.    [provider]  lanthanum (FOSRENOL) 1000 MG chewable tablet Chew 1 tablet (1,000 mg total) by mouth 3 (three) times daily with meals. Patient taking differently: Chew 2,000 mg by mouth 3 (three) times daily with meals. 05/25/18   Domenic Polite, MD  menthol-cetylpyridinium (CEPACOL) 3 MG lozenge Take 1 lozenge (3 mg total) by mouth as needed for sore throat. 06/26/21   Lavina Hamman, MD  OneTouch Delica Lancets 15Q MISC Use onetouch delica lancets to check blood sugar twice  daily. Patient taking differently: 1 each by Other route 2 (two) times daily. 04/21/20   Elayne Snare, MD  sacubitril-valsartan (ENTRESTO) 49-51 MG Take 1 tablet by mouth 2 (two) times daily.    [provider]  Vitamin D, Ergocalciferol, (DRISDOL) 1.25 MG (50000 UNIT) CAPS capsule Take 50,000 Units by mouth every 7 (seven) days.    [provider]    Allergies    Penicillins  Review of Systems   Review of Systems  All other systems reviewed and are negative.  Physical Exam Updated Vital Signs BP (!) 152/79    Pulse 71    Temp 98.3 F (36.8 C) (Oral)    Resp 17    SpO2 97%   Physical Exam Vitals and nursing note reviewed.  Constitutional:      General: He is not in acute distress.    Appearance: Normal appearance. He is well-developed. He is not toxic-appearing.  HENT:     Head: Normocephalic and atraumatic.  Eyes:     General: Lids are normal.     Conjunctiva/sclera: Conjunctivae normal.     Pupils: Pupils are equal, round, and reactive to light.  Neck:     Thyroid: No thyroid mass.     Trachea: No tracheal deviation.  Cardiovascular:     Rate and Rhythm: Normal rate and regular rhythm.     Heart sounds: Normal heart sounds. No murmur heard.   No gallop.  Pulmonary:     Effort: Pulmonary effort is normal. No respiratory distress.     Breath sounds: Normal breath sounds. No stridor. No decreased breath sounds, wheezing, rhonchi or rales.  Abdominal:     General: There is no distension.     Palpations: Abdomen is soft.     Tenderness: There is abdominal tenderness in the right lower quadrant. There is guarding. There is no rebound.  Musculoskeletal:        General: No tenderness. Normal range of motion.     Cervical back: Normal range of motion and neck supple.  Skin:    General: Skin is warm and dry.     Findings: No abrasion or rash.  Neurological:     Mental Status: He is alert and oriented to person, place, and time. Mental status is at baseline.      GCS: GCS eye subscore is 4. GCS verbal subscore is 5. GCS motor subscore is 6.     Cranial Nerves: No cranial nerve deficit.     Sensory: No sensory deficit.     Motor: Motor function is intact.  Psychiatric:  Attention and Perception: Attention normal.        Speech: Speech normal.        Behavior: Behavior normal.    ED Results / Procedures / Treatments   Labs (all labs ordered are listed, but only abnormal results are displayed) Labs Reviewed  CBC - Abnormal; Notable for the following components:      Result Value   WBC 12.0 (*)    RBC 3.32 (*)    Hemoglobin 10.3 (*)    HCT 32.4 (*)    Platelets 131 (*)    All other components within normal limits  RESP PANEL BY RT-PCR (FLU A&B, COVID) ARPGX2  LIPASE, BLOOD  COMPREHENSIVE METABOLIC PANEL  URINALYSIS, ROUTINE W REFLEX MICROSCOPIC    EKG None  Radiology No results found.  Procedures Procedures   Medications Ordered in ED Medications  lactated ringers infusion ( Intravenous Rate/Dose Change 12/06/21 1927)  cefTRIAXone (ROCEPHIN) 2 g in sodium chloride 0.9 % 100 mL IVPB (has no administration in time range)    And  metroNIDAZOLE (FLAGYL) IVPB 500 mg (has no administration in time range)    ED Course  I have reviewed the triage vital signs and the nursing notes.  Pertinent labs & imaging results that were available during my care of the patient were reviewed by me and considered in my medical decision making (see chart for details).    MDM Rules/Calculators/A&P                         Abdominal CT positive for appendicitis as seen on care everywhere.  Mild leukocytosis noted.  Patient started on IV antibiotics and will consult general surgery    Final Clinical Impression(s) / ED Diagnoses Final diagnoses:  None    Rx / DC Orders ED Discharge Orders     None        Lacretia Leigh, MD 12/06/21 1945    Lacretia Leigh, MD 12/06/21 1954

## 2021-12-06 NOTE — Consult Note (Addendum)
CC: abdominal pain  Requesting provider: Dr Zenia Resides  HPI: [name] [name] is an 55 y.o. male who is here for evaluation after being transferred from [facility] with a diagnosis of appendicitis seen on CT at [facility] mid afternoon today. Saw his pcp team earlier today with the following complaints:  Not feeling well over the last few days. Had COVID booster on 11/28/21. On 12/01/21, nausea and vomiting x 1 episode.  Yesterday (Wednesday)  he was at a doctor's visit and he had case of chills/shaking. He has fatigue.  He states that his discomfort probably started on Tuesday evening.  He states in his lower abdomen and goes from right to left.. Pain is constant; 3/10; not worsened or relieved by position or food. No dysuria or hematruia. No flank pain  Had chicken noodle soup last night along with some toast. Around 10 pm last night had nausea and vomiting. Again around 12 pm with nausea and vomiting. No fever. No further nausea or vomiting today but he has not tried to eat anything. Last bowel movement was earlier today; no diarrhea; no blood or mucous but he did not look closely  C-scope in 2019 without diverticulosis   Has ESRD with dialysis on Tues, Thurs, and Saturday  His PCP sent him for labs and a stat CT.  He had an elevated white blood cell count and the stat CT showed appendicitis  Comorbidities include DM, CHF (echo 7/22 showed global hypokinesis, ef 30-35%), HTN, ESRD \ Had choledocholithiasis and cholecystitis this past July requiring ERCP followed by laparoscopic cholecystectomy  Past Medical History:  Diagnosis Date   Bacteremia    Diabetes mellitus    Dialysis patient [facility])    Hypertension    Pneumonia 02/2012   Strep pneumoniae bilateral pneumonia complicated by bacteremia   Renal disorder    End stage, not on dialysis    Past Surgical History:  Procedure Laterality Date   AMPUTATION TOE     AV FISTULA PLACEMENT Left 05/22/2018   Procedure:  Creation of BrachiaCephalic Fistula Left Arm;  Surgeon: Angelia Mould, MD;  Location: [place];  Service: Vascular;  Laterality: Left;   Tibbie Left 11/12/2018   Procedure: BASILIC VEIN TRANSPOSITION LEFT ARM;  Surgeon: Serafina Mitchell, MD;  Location: [place];  Service: Vascular;  Laterality: Left;   CHEST TUBE INSERTION     placed during hospitalization 02/2012   CHOLECYSTECTOMY N/A 06/25/2021   Procedure: LAPAROSCOPIC CHOLECYSTECTOMY;  Surgeon: Stark Klein, MD;  Location: [place];  Service: General;  Laterality: N/A;   COLONOSCOPY  04/01/2012   Procedure: COLONOSCOPY;  Surgeon: Juanita Craver, MD;  Location: [place] [facility];  Service: Endoscopy;  Laterality: N/A;   ERCP N/A 06/24/2021   Procedure: ENDOSCOPIC RETROGRADE CHOLANGIOPANCREATOGRAPHY (ERCP);  Surgeon: Clarene Essex, MD;  Location: [place];  Service: Endoscopy;  Laterality: N/A;   ESOPHAGOGASTRODUODENOSCOPY  03/31/2012   Procedure: ESOPHAGOGASTRODUODENOSCOPY (EGD);  Surgeon: Beryle Beams, MD;  Location: [facility] ENDOSCOPY;  Service: Endoscopy;  Laterality: N/A;   EXCHANGE OF A DIALYSIS CATHETER Right 05/22/2018   Procedure: EXCHANGE OF A DIALYSIS CATHETER;  Surgeon: Angelia Mould, MD;  Location: [place];  Service: Vascular;  Laterality: Right;   IR FLUORO GUIDE CV LINE RIGHT  05/20/2018   IR THROMBECTOMY AV FISTULA W/THROMBOLYSIS INC/SHUNT/IMG LEFT Left 09/10/2018   IR US GUIDE VASC ACCESS LEFT  09/10/2018   IR US GUIDE VASC ACCESS RIGHT  05/20/2018   PANCREATIC STENT PLACEMENT  06/24/2021  Procedure: PANCREATIC STENT PLACEMENT;  Surgeon: Clarene Essex, MD;  Location: [place];  Service: Endoscopy;;   REMOVAL OF STONES  06/24/2021   Procedure: REMOVAL OF STONES;  Surgeon: Clarene Essex, MD;  Location: [place];  Service: Endoscopy;;   SPHINCTEROTOMY  06/24/2021   Procedure: Joan Mayans;  Surgeon: Clarene Essex, MD;  Location: [place];  Service: Endoscopy;;   STENT REMOVAL  06/24/2021   Procedure: STENT REMOVAL;   Surgeon: Clarene Essex, MD;  Location: [facility] ENDOSCOPY;  Service: Endoscopy;;    Family History  Problem Relation Age of Onset   Diabetes Maternal Grandmother     Social:  reports that he has never smoked. He has never used smokeless tobacco. He reports current alcohol use. He reports that he does not use drugs.  Allergies:  Allergies  Allergen Reactions   Penicillins Rash and Other (See Comments)    Tolerating Ceftriaxone (03/12/12) Has patient had a PCN reaction causing immediate rash, facial/tongue/throat swelling, SOB or lightheadedness with hypotension: Yes Has patient had a PCN reaction causing severe rash involving mucus membranes or skin necrosis: Unk Has patient had a PCN reaction that required hospitalization: No Has patient had a PCN reaction occurring within the last 10 years: No If all of the above answers are "NO", then may proceed with Cephalosporin use.     Medications: I have reviewed the patient's current medications.   ROS - all of the below systems have been reviewed with the patient and positives are indicated with bold text General: chills, fever or night sweats Eyes: blurry vision or double vision ENT: epistaxis or sore throat Allergy/Immunology: itchy/watery eyes or nasal congestion Hematologic/Lymphatic: bleeding problems, blood clots or swollen lymph nodes Endocrine: temperature intolerance or unexpected weight changes Breast: new or changing breast lumps or nipple discharge Resp: cough, shortness of breath, or wheezing CV: chest pain or dyspnea on exertion GI: as per HPI GU: dysuria, trouble voiding, or hematuria MSK: joint pain or joint stiffness Neuro: TIA or stroke symptoms Derm: pruritus and skin lesion changes Psych: anxiety and depression  PE Blood pressure (!) 165/82, pulse 69, temperature 98.1 F (36.7 C), temperature source Oral, resp. rate 17, SpO2 97 %. Constitutional: NAD; conversant; no deformities; nontoxic-appearing sitting upright in  bed talking on his cell phone. Eyes: Moist conjunctiva; no lid lag; anicteric; PERRL Neck: Trachea midline; no thyromegaly Lungs: Normal respiratory effort; no tactile fremitus CV: RRR; no palpable thrills; no pitting edema GI: Abd soft, old trocar scars, some tenderness to palpation in right lower quadrant but definitely no peritonitis; no palpable hepatosplenomegaly MSK: Normal gait; no clubbing/cyanosis Psychiatric: Appropriate affect; alert and oriented x3 Lymphatic: No palpable cervical or axillary lymphadenopathy Skin: No rash, lesions or jaundice  Results for orders placed or performed during the hospital encounter of 12/06/21 (from the past 48 hour(s))  Lipase, blood     Status: None   Collection Time: 12/06/21  7:20 PM  Result Value Ref Range   Lipase 11 11 - 51 U/L    Comment: Performed at [facility], [place], [place],  [address]  Comprehensive metabolic panel     Status: Abnormal   Collection Time: 12/06/21  7:20 PM  Result Value Ref Range   Sodium 135 135 - 145 mmol/L   Potassium 4.0 3.5 - 5.1 mmol/L   Chloride 96 (L) 98 - 111 mmol/L   CO2 30 22 - 32 mmol/L   Glucose, Bld 151 (H) 70 - 99 mg/dL    Comment: Glucose reference range  applies only to samples taken after fasting for at least 8 hours.   BUN 26 (H) 6 - 20 mg/dL   Creatinine, Ser 4.50 (H) 0.61 - 1.24 mg/dL   Calcium 8.8 (L) 8.9 - 10.3 mg/dL   Total Protein 6.9 6.5 - 8.1 g/dL   Albumin 3.6 3.5 - 5.0 g/dL   AST 27 15 - 41 U/L   ALT 49 (H) 0 - 44 U/L   Alkaline Phosphatase 110 38 - 126 U/L   Total Bilirubin 0.9 0.3 - 1.2 mg/dL   GFR, Estimated 15 (L) >60 mL/min    Comment: (NOTE) Calculated using the CKD-EPI Creatinine Equation (2021)    Anion gap 9 5 - 15    Comment: Performed at [facility], [address] [place], [place] [address]  CBC     Status: Abnormal   Collection Time: 12/06/21  7:20 PM  Result Value Ref Range   WBC 12.0 (H) 4.0 - 10.5 K/uL    RBC 3.32 (L) 4.22 - 5.81 MIL/uL   Hemoglobin 10.3 (L) 13.0 - 17.0 g/dL   HCT 32.4 (L) 39.0 - 52.0 %   MCV 97.6 80.0 - 100.0 fL   MCH 31.0 26.0 - 34.0 pg   MCHC 31.8 30.0 - 36.0 g/dL   RDW 14.4 11.5 - 15.5 %   Platelets 131 (L) 150 - 400 K/uL   nRBC 0.0 0.0 - 0.2 %    Comment: Performed at [facility], [address], [place], [place] [address]  Resp Panel by RT-PCR (Flu A&B, Covid) Nasopharyngeal Swab     Status: None   Collection Time: 12/06/21  7:26 PM   Specimen: Nasopharyngeal Swab; Nasopharyngeal(NP) swabs in vial transport medium  Result Value Ref Range   SARS Coronavirus 2 by RT PCR NEGATIVE NEGATIVE    Comment: (NOTE) SARS-CoV-2 target nucleic acids are NOT DETECTED.  The SARS-CoV-2 RNA is generally detectable in upper respiratory specimens during the acute phase of infection. The lowest concentration of SARS-CoV-2 viral copies this assay can detect is 138 copies/mL. A negative result does not preclude SARS-Cov-2 infection and should not be used as the sole basis for treatment or other patient management decisions. A negative result may occur with  improper specimen collection/handling, submission of specimen other than nasopharyngeal swab, presence of viral mutation(s) within the areas targeted by this assay, and inadequate number of viral copies(<138 copies/mL). A negative result must be combined with clinical observations, patient history, and epidemiological information. The expected result is Negative.  Fact Sheet for Patients:  EntrepreneurPulse.com.au  Fact Sheet for Healthcare Providers:  IncredibleEmployment.be  This test is no t yet approved or cleared by the [place] FDA and  has been authorized for detection and/or diagnosis of SARS-CoV-2 by FDA under an Emergency Use Authorization (EUA). This EUA will remain  in effect (meaning this test can be used) for the duration of the COVID-19  declaration under Section 564(b)(1) of the Act, 21 U.S.C.section 360bbb-3(b)(1), unless the authorization is terminated  or revoked sooner.       Influenza A by PCR NEGATIVE NEGATIVE   Influenza B by PCR NEGATIVE NEGATIVE    Comment: (NOTE) The Xpert Xpress SARS-CoV-2/FLU/RSV plus assay is intended as an aid in the diagnosis of influenza from Nasopharyngeal swab specimens and should not be used as a sole basis for treatment. Nasal washings and aspirates are unacceptable for Xpert Xpress SARS-CoV-2/FLU/RSV testing.  Fact Sheet for Patients: EntrepreneurPulse.com.au  Fact Sheet for Healthcare Providers: IncredibleEmployment.be  This test is not yet approved or cleared by the [place] and has been authorized for detection and/or diagnosis of SARS-CoV-2 by FDA under an Emergency Use Authorization (EUA). This EUA will remain in effect (meaning this test can be used) for the duration of the COVID-19 declaration under Section 564(b)(1) of the Act, 21 U.S.C. section 360bbb-3(b)(1), unless the authorization is terminated or revoked.  Performed at [facility], [address] [place], [place] [address]     No results found.  Imaging: Ct report CT ABDOMEN PELVIS W CONTRAST (ROUTINE), 12/06/2021 3:43 PM   INDICATION:LLQ abdominal pain LLQ pain > RLQ pain; diverticulitis suspected R10.32 LLQ abdominal pain  COMPARISON: CT abdomen and pelvis 01/22/2021   TECHNIQUE: Multislice axial images were obtained through the abdomen and pelvis with administration of iodinated intravenous contrast material. Multi-planar reformatted images were generated for additional analysis. Nongated technique limits cardiac detail.  FINDINGS:   LOWER CHEST:  .  Mediastinum: Within normal limits.  .  Heart/vessel: Normal heart size. No pericardial effusion. Aortic annular and mitral annular calcifications.  .  Lungs/pleura: Redemonstrated  interstitial and nodular opacities of the bilateral lung bases with areas of focal bronchiectasis and dendriform pulmonary ossification. Subsegmental atelectasis. Trace bilateral pleural effusions.   ABDOMEN/PELVIS:  .  Liver: Within normal limits.  .  Gallbladder/biliary: Cholecystectomy.  Marland Kitchen  Spleen: Within normal limits.  .  Pancreas: Within normal limits.  .  Adrenals: Within normal limits.  .  Kidneys: No hydronephrosis.  .  Peritoneum/Mesentery/Extraperitoneum: Small-volume free fluid in the pelvis, possibly reactive. No pneumoperitoneum.  .  GI tract: Fluid-filled and dilated appendix in the right lower quadrant with adjacent mild fat stranding. No bowel obstruction.  .  Ureters/Bladder: Mild circumferential bladder wall thickening.  .  Reproductive system: Within normal limits.  .  Vascular: Apparent filling defect of the superior mesenteric vein (series 2/91). Atherosclerotic calcifications of the abdominal aorta without aneurysmal change.   MSK:  .  No acute osseous abnormality.   CONCLUSION:    1.  Fluid-filled and dilated appendix in the right lower quadrant with adjacent mild fat stranding, concerning for acute appendicitis. Small volume free fluid in the pelvis, possibly reactive.  2.  Mild circumferential bladder wall thickening, which may be reactive or secondary to underdistention, though recommend correlation with urinalysis.  3.  Apparent filling defect of the superior mesenteric vein is favored to be secondary to flow artifact, although nonocclusive thrombus not completely excluded.    4.  Redemonstrated interstitial and nodular opacities of the bilateral lung bases with areas of focal bronchiectasis, which may represent the sequela of chronic fibrotic change.   A/P: [name] [name] is an 55 y.o. male with  Acute appendicitis ESRD on HD CHF HTN DM Chronic anemia - probable chronic disease  He is resting comfortably.  He is actually asking if he can eat some  food.  No fever.  No tachycardia.  Some discomfort on exam but definitely no peritonitis.  We have called [facility] to see if they can push the imaging.  No overt signs of perforation other than some trace free fluid in the pelvis.  I do not believe he needs urgent appendectomy this evening.  We will plan laparoscopic appendectomy in the morning with Dr. Bobbye Morton Admit to medicine IV antibiotic Can have home medications Sliding scale insulin ER physician discussed his case with nephrology who said that he can just maintain his normal dialysis schedule Npo except meds/sips/ice chips  I  do not think he needs or needs cardiac clearance.  He tolerated his laparoscopic cholecystectomy and ERCP earlier this summer No current CHF symptoms  We discussed the etiology and management of acute appendicitis. We discussed operative and nonoperative management.  I recommended operative management along with IV antibiotics.  We discussed laparoscopic appendectomy. We discussed the risk and benefits of surgery including but not limited to bleeding, infection, injury to surrounding structures, need to convert to an open procedure, blood clot formation, post operative abscess or wound infection, staple line complications such as leak or bleeding, hernia formation, post operative ileus, need for additional procedures, anesthesia complications, and the typical postoperative course. I explained that the patient should expect a good improvement in their symptoms.   Leighton Ruff. Redmond Pulling, MD, FACS General, Bariatric, & Minimally Invasive Surgery [facility], [place]

## 2021-12-07 ENCOUNTER — Encounter (HOSPITAL_COMMUNITY): Payer: Self-pay | Admitting: Family Medicine

## 2021-12-07 ENCOUNTER — Inpatient Hospital Stay (HOSPITAL_COMMUNITY): Payer: Medicare Other | Admitting: Anesthesiology

## 2021-12-07 ENCOUNTER — Encounter (HOSPITAL_COMMUNITY)
Admission: EM | Disposition: A | Discharge status: Home / Self Care | Payer: Self-pay | Source: Home / Self Care | Attending: Emergency Medicine

## 2021-12-07 DIAGNOSIS — N186 End stage renal disease: Secondary | ICD-10-CM | POA: Diagnosis not present

## 2021-12-07 DIAGNOSIS — Z20822 Contact with and (suspected) exposure to covid-19: Secondary | ICD-10-CM | POA: Diagnosis not present

## 2021-12-07 DIAGNOSIS — K3580 Unspecified acute appendicitis: Secondary | ICD-10-CM | POA: Diagnosis not present

## 2021-12-07 DIAGNOSIS — I132 Hypertensive heart and chronic kidney disease with heart failure and with stage 5 chronic kidney disease, or end stage renal disease: Secondary | ICD-10-CM | POA: Diagnosis not present

## 2021-12-07 HISTORY — PX: LAPAROSCOPIC APPENDECTOMY: SHX408

## 2021-12-07 LAB — GLUCOSE, CAPILLARY
Glucose-Capillary: 81 mg/dL (ref 70–99)
Glucose-Capillary: 69 mg/dL — ABNORMAL LOW (ref 70–99)
Glucose-Capillary: 83 mg/dL (ref 70–99)
Glucose-Capillary: 278 mg/dL — ABNORMAL HIGH (ref 70–99)
Glucose-Capillary: 239 mg/dL — ABNORMAL HIGH (ref 70–99)

## 2021-12-07 LAB — CBC
HCT: 33.8 % — ABNORMAL LOW (ref 39.0–52.0)
Hemoglobin: 10.7 g/dL — ABNORMAL LOW (ref 13.0–17.0)
MCH: 32 pg (ref 26.0–34.0)
MCHC: 31.7 g/dL (ref 30.0–36.0)
MCV: 101.2 fL — ABNORMAL HIGH (ref 80.0–100.0)
Platelets: 101 10*3/uL — ABNORMAL LOW (ref 150–400)
RBC: 3.34 MIL/uL — ABNORMAL LOW (ref 4.22–5.81)
RDW: 14.2 % (ref 11.5–15.5)
WBC: 9.5 10*3/uL (ref 4.0–10.5)
nRBC: 0 % (ref 0.0–0.2)

## 2021-12-07 LAB — BASIC METABOLIC PANEL
Anion gap: 10 (ref 5–15)
BUN: 31 mg/dL — ABNORMAL HIGH (ref 6–20)
CO2: 28 mmol/L (ref 22–32)
Calcium: 8.3 mg/dL — ABNORMAL LOW (ref 8.9–10.3)
Chloride: 97 mmol/L — ABNORMAL LOW (ref 98–111)
Creatinine, Ser: 5.62 mg/dL — ABNORMAL HIGH (ref 0.61–1.24)
GFR, Estimated: 11 mL/min — ABNORMAL LOW (ref >60)
Glucose, Bld: 93 mg/dL (ref 70–99)
Potassium: 4.1 mmol/L (ref 3.5–5.1)
Sodium: 135 mmol/L (ref 135–145)

## 2021-12-07 LAB — HEPATIC FUNCTION PANEL
ALT: 44 U/L (ref 0–44)
AST: 24 U/L (ref 15–41)
Albumin: 2.9 g/dL — ABNORMAL LOW (ref 3.5–5.0)
Alkaline Phosphatase: 103 U/L (ref 38–126)
Bilirubin, Direct: 0.2 mg/dL (ref 0.0–0.2)
Indirect Bilirubin: 0.7 mg/dL (ref 0.3–0.9)
Total Bilirubin: 0.9 mg/dL (ref 0.3–1.2)
Total Protein: 6.3 g/dL — ABNORMAL LOW (ref 6.5–8.1)

## 2021-12-07 LAB — HEMOGLOBIN A1C
Hgb A1c MFr Bld: 7.9 % — ABNORMAL HIGH (ref 4.8–5.6)
Mean Plasma Glucose: 180.03 mg/dL

## 2021-12-07 LAB — CBG MONITORING, ED
Glucose-Capillary: 93 mg/dL (ref 70–99)
Glucose-Capillary: 83 mg/dL (ref 70–99)

## 2021-12-07 SURGERY — APPENDECTOMY, LAPAROSCOPIC
Anesthesia: General | Site: Abdomen

## 2021-12-07 MED ORDER — INSULIN GLARGINE-YFGN 100 UNIT/ML ~~LOC~~ SOLN
15.0000 [IU] | Freq: Every day | SUBCUTANEOUS | Status: DC
  Administered 2021-12-07: 15 [IU] via SUBCUTANEOUS
  Filled 2021-12-07 (×3): qty 0.15

## 2021-12-07 MED ORDER — FENTANYL CITRATE (PF) 250 MCG/5ML IJ SOLN
INTRAMUSCULAR | Status: DC | PRN
  Administered 2021-12-07: 100 ug via INTRAVENOUS

## 2021-12-07 MED ORDER — BUPIVACAINE-EPINEPHRINE (PF) 0.25% -1:200000 IJ SOLN
INTRAMUSCULAR | Status: AC
  Filled 2021-12-07: qty 30

## 2021-12-07 MED ORDER — OXYCODONE HCL 5 MG PO TABS
5.0000 mg | ORAL_TABLET | ORAL | Status: DC | PRN
Start: 2021-12-07 — End: 2021-12-08
  Administered 2021-12-07 – 2021-12-08 (×2): 5 mg via ORAL
  Filled 2021-12-07 (×2): qty 1

## 2021-12-07 MED ORDER — METHOCARBAMOL 750 MG PO TABS: 750.0000 mg | ORAL_TABLET | Freq: Four times a day (QID) | ORAL | 1 refills | Status: AC

## 2021-12-07 MED ORDER — PHENYLEPHRINE 40 MCG/ML (10ML) SYRINGE FOR IV PUSH (FOR BLOOD PRESSURE SUPPORT)
PREFILLED_SYRINGE | INTRAVENOUS | Status: DC | PRN
  Administered 2021-12-07: 80 ug via INTRAVENOUS

## 2021-12-07 MED ORDER — MORPHINE SULFATE (PF) 2 MG/ML IV SOLN
1.0000 mg | INTRAVENOUS | Status: DC | PRN
Start: 2021-12-07 — End: 2021-12-08

## 2021-12-07 MED ORDER — SUGAMMADEX SODIUM 200 MG/2ML IV SOLN
INTRAVENOUS | Status: DC | PRN
  Administered 2021-12-07: 200 mg via INTRAVENOUS

## 2021-12-07 MED ORDER — FENTANYL CITRATE (PF) 250 MCG/5ML IJ SOLN
INTRAMUSCULAR | Status: AC
  Filled 2021-12-07: qty 5

## 2021-12-07 MED ORDER — LACTATED RINGERS IV SOLN: INTRAVENOUS | Status: DC | PRN

## 2021-12-07 MED ORDER — INSULIN GLARGINE-YFGN 100 UNIT/ML ~~LOC~~ SOLN: 15.0000 [IU] | Freq: Every day | SUBCUTANEOUS | Status: DC

## 2021-12-07 MED ORDER — 0.9 % SODIUM CHLORIDE (POUR BTL) OPTIME
TOPICAL | Status: DC | PRN
  Administered 2021-12-07: 1000 mL

## 2021-12-07 MED ORDER — DOCUSATE SODIUM 100 MG PO CAPS: 100.0000 mg | ORAL_CAPSULE | Freq: Two times a day (BID) | ORAL | 2 refills | Status: AC

## 2021-12-07 MED ORDER — MIDAZOLAM HCL 2 MG/2ML IJ SOLN
INTRAMUSCULAR | Status: AC
  Filled 2021-12-07: qty 2

## 2021-12-07 MED ORDER — INSULIN ASPART 100 UNIT/ML IJ SOLN
0.0000 [IU] | Freq: Three times a day (TID) | INTRAMUSCULAR | Status: DC
  Administered 2021-12-07: 2 [IU] via SUBCUTANEOUS
  Administered 2021-12-07: 3 [IU] via SUBCUTANEOUS

## 2021-12-07 MED ORDER — PHENYLEPHRINE HCL-NACL 20-0.9 MG/250ML-% IV SOLN
INTRAVENOUS | Status: DC | PRN
  Administered 2021-12-07: 30 ug/min via INTRAVENOUS

## 2021-12-07 MED ORDER — ROCURONIUM BROMIDE 10 MG/ML (PF) SYRINGE
PREFILLED_SYRINGE | INTRAVENOUS | Status: DC | PRN
  Administered 2021-12-07: 20 mg via INTRAVENOUS
  Administered 2021-12-07: 40 mg via INTRAVENOUS

## 2021-12-07 MED ORDER — ONDANSETRON HCL 4 MG/2ML IJ SOLN
INTRAMUSCULAR | Status: DC | PRN
  Administered 2021-12-07: 4 mg via INTRAVENOUS

## 2021-12-07 MED ORDER — CHLORHEXIDINE GLUCONATE 0.12 % MT SOLN
OROMUCOSAL | Status: AC
  Administered 2021-12-07: 15 mL
  Filled 2021-12-07: qty 15

## 2021-12-07 MED ORDER — LIDOCAINE 2% (20 MG/ML) 5 ML SYRINGE
INTRAMUSCULAR | Status: DC | PRN
  Administered 2021-12-07: 100 mg via INTRAVENOUS

## 2021-12-07 MED ORDER — PROPOFOL 10 MG/ML IV BOLUS
INTRAVENOUS | Status: AC
  Filled 2021-12-07: qty 20

## 2021-12-07 MED ORDER — IBUPROFEN 600 MG PO TABS: 600.0000 mg | ORAL_TABLET | Freq: Four times a day (QID) | ORAL | 1 refills | Status: AC | PRN

## 2021-12-07 MED ORDER — OXYCODONE HCL 5 MG PO TABS: 5.0000 mg | ORAL_TABLET | ORAL | 0 refills | Status: AC | PRN

## 2021-12-07 MED ORDER — PROPOFOL 10 MG/ML IV BOLUS
INTRAVENOUS | Status: DC | PRN
  Administered 2021-12-07: 150 mg via INTRAVENOUS

## 2021-12-07 MED ORDER — ACETAMINOPHEN 500 MG PO TABS
1000.0000 mg | ORAL_TABLET | Freq: Four times a day (QID) | ORAL | Status: DC
  Administered 2021-12-07 – 2021-12-08 (×4): 1000 mg via ORAL
  Filled 2021-12-07 (×4): qty 2

## 2021-12-07 MED ORDER — MIDAZOLAM HCL 2 MG/2ML IJ SOLN
INTRAMUSCULAR | Status: DC | PRN
  Administered 2021-12-07: 2 mg via INTRAVENOUS

## 2021-12-07 MED ORDER — BUPIVACAINE-EPINEPHRINE 0.25% -1:200000 IJ SOLN
INTRAMUSCULAR | Status: DC | PRN
  Administered 2021-12-07: 30 mL

## 2021-12-07 MED ORDER — FENTANYL CITRATE (PF) 100 MCG/2ML IJ SOLN: 25.0000 ug | INTRAMUSCULAR | Status: DC | PRN

## 2021-12-07 SURGICAL SUPPLY — 47 items
APPLIER CLIP ROT 10 11.4 M/L (STAPLE)
BAG COUNTER SPONGE SURGICOUNT (BAG) ×2 IMPLANT
BLADE CLIPPER SURG (BLADE) IMPLANT
CANISTER SUCT 3000ML PPV (MISCELLANEOUS) IMPLANT
CHLORAPREP W/TINT 26 (MISCELLANEOUS) ×2 IMPLANT
CLIP APPLIE ROT 10 11.4 M/L (STAPLE) IMPLANT
COVER SURGICAL LIGHT HANDLE (MISCELLANEOUS) ×2 IMPLANT
CUTTER FLEX LINEAR 45M (STAPLE) ×2 IMPLANT
DERMABOND ADHESIVE PROPEN (GAUZE/BANDAGES/DRESSINGS) ×1
DERMABOND ADVANCED (GAUZE/BANDAGES/DRESSINGS) ×1
DERMABOND ADVANCED .7 DNX12 (GAUZE/BANDAGES/DRESSINGS) IMPLANT
DERMABOND ADVANCED .7 DNX6 (GAUZE/BANDAGES/DRESSINGS) ×1 IMPLANT
ELECT CAUTERY BLADE 6.4 (BLADE) ×2 IMPLANT
ELECT REM PT RETURN 9FT ADLT (ELECTROSURGICAL) ×2
ELECTRODE REM PT RTRN 9FT ADLT (ELECTROSURGICAL) ×1 IMPLANT
ENDOLOOP SUT PDS II  0 18 (SUTURE)
ENDOLOOP SUT PDS II 0 18 (SUTURE) IMPLANT
GLOVE SURG ENC MOIS LTX SZ6.5 (GLOVE) ×2 IMPLANT
GLOVE SURG UNDER POLY LF SZ6 (GLOVE) ×2 IMPLANT
GOWN STRL REUS W/ TWL LRG LVL3 (GOWN DISPOSABLE) ×3 IMPLANT
GOWN STRL REUS W/TWL LRG LVL3 (GOWN DISPOSABLE) ×6
KIT BASIN OR (CUSTOM PROCEDURE TRAY) ×2 IMPLANT
KIT TURNOVER KIT B (KITS) ×2 IMPLANT
NDL INSUFFLATION 14GA 120MM (NEEDLE) IMPLANT
NEEDLE INSUFFLATION 14GA 120MM (NEEDLE) IMPLANT
NS IRRIG 1000ML POUR BTL (IV SOLUTION) ×2 IMPLANT
PAD ARMBOARD 7.5X6 YLW CONV (MISCELLANEOUS) ×4 IMPLANT
PENCIL BUTTON HOLSTER BLD 10FT (ELECTRODE) ×2 IMPLANT
POUCH SPECIMEN RETRIEVAL 10MM (ENDOMECHANICALS) ×2 IMPLANT
RELOAD 45 VASCULAR/THIN (ENDOMECHANICALS) IMPLANT
RELOAD STAPLE 45 2.5 WHT GRN (ENDOMECHANICALS) IMPLANT
RELOAD STAPLE 45 3.5 BLU ETS (ENDOMECHANICALS) IMPLANT
RELOAD STAPLE TA45 3.5 REG BLU (ENDOMECHANICALS) IMPLANT
SCISSORS LAP 5X35 DISP (ENDOMECHANICALS) IMPLANT
SET IRRIG TUBING LAPAROSCOPIC (IRRIGATION / IRRIGATOR) IMPLANT
SET TUBE SMOKE EVAC HIGH FLOW (TUBING) ×2 IMPLANT
SHEARS HARMONIC ACE PLUS 36CM (ENDOMECHANICALS) IMPLANT
SLEEVE ENDOPATH XCEL 5M (ENDOMECHANICALS) ×2 IMPLANT
SPECIMEN JAR SMALL (MISCELLANEOUS) ×2 IMPLANT
SUT MNCRL AB 4-0 PS2 18 (SUTURE) ×2 IMPLANT
SUT VICRYL 0 UR6 27IN ABS (SUTURE) IMPLANT
TOWEL GREEN STERILE (TOWEL DISPOSABLE) ×2 IMPLANT
TOWEL GREEN STERILE FF (TOWEL DISPOSABLE) ×2 IMPLANT
TRAY LAPAROSCOPIC MC (CUSTOM PROCEDURE TRAY) ×2 IMPLANT
TROCAR XCEL BLUNT TIP 100MML (ENDOMECHANICALS) IMPLANT
TROCAR XCEL NON-BLD 5MMX100MML (ENDOMECHANICALS) ×2 IMPLANT
WATER STERILE IRR 1000ML POUR (IV SOLUTION) ×2 IMPLANT

## 2021-12-07 NOTE — Consult Note (Signed)
[facility] Renal Consultation Note    Indication for Consultation:  Management of ESRD/hemodialysis; anemia, hypertension/volume and secondary hyperparathyroidism  HPI: [name] [name] is a 55 y.o. male with ESRD on HD TTS, DM HTN, CHF. Active on transplant list ([place]). Now admitted with appendicitis. He had 2-3 days of abd pain/nausea. Had outpatient abd CT that was positive for appendicitis and sent to the ED. He underwent laparoscopic appendectomy this am. Labs Na 135, K 4.1, BUN 31, Cr 5.6, WBC 9.5 Hgb 10.7. Nephrology asked to see for dialysis needs.   Dialysis TTS at [facility]. He did complete dialysis yesterday. Seen at bedside. He feels ok, comfortable, just tired, would like to get some rest. Denies f,c, cp, sob, abd pain.    Past Medical History:  Diagnosis Date   Bacteremia    Diabetes mellitus    Dialysis patient [facility])    Hypertension    Pneumonia 02/2012   Strep pneumoniae bilateral pneumonia complicated by bacteremia   Renal disorder    End stage, not on dialysis   Past Surgical History:  Procedure Laterality Date   AMPUTATION TOE     AV FISTULA PLACEMENT Left 05/22/2018   Procedure: Creation of BrachiaCephalic Fistula Left Arm;  Surgeon: Angelia Mould, MD;  Location: [place];  Service: Vascular;  Laterality: Left;   [place] Left 11/12/2018   Procedure: BASILIC VEIN TRANSPOSITION LEFT ARM;  Surgeon: Serafina Mitchell, MD;  Location: [place];  Service: Vascular;  Laterality: Left;   CHEST TUBE INSERTION     placed during hospitalization 02/2012   CHOLECYSTECTOMY N/A 06/25/2021   Procedure: LAPAROSCOPIC CHOLECYSTECTOMY;  Surgeon: Stark Klein, MD;  Location: [place];  Service: General;  Laterality: N/A;   COLONOSCOPY  04/01/2012   Procedure: COLONOSCOPY;  Surgeon: Juanita Craver, MD;  Location: [place] ENDOSCOPY;  Service: Endoscopy;  Laterality: N/A;   ERCP N/A 06/24/2021   Procedure: ENDOSCOPIC RETROGRADE  CHOLANGIOPANCREATOGRAPHY (ERCP);  Surgeon: Clarene Essex, MD;  Location: [place];  Service: Endoscopy;  Laterality: N/A;   ESOPHAGOGASTRODUODENOSCOPY  03/31/2012   Procedure: ESOPHAGOGASTRODUODENOSCOPY (EGD);  Surgeon: Beryle Beams, MD;  Location: [facility] ENDOSCOPY;  Service: Endoscopy;  Laterality: N/A;   EXCHANGE OF A DIALYSIS CATHETER Right 05/22/2018   Procedure: EXCHANGE OF A DIALYSIS CATHETER;  Surgeon: Angelia Mould, MD;  Location: [place];  Service: Vascular;  Laterality: Right;   IR FLUORO GUIDE CV LINE RIGHT  05/20/2018   IR THROMBECTOMY AV FISTULA W/THROMBOLYSIS INC/SHUNT/IMG LEFT Left 09/10/2018   IR US GUIDE VASC ACCESS LEFT  09/10/2018   IR US GUIDE VASC ACCESS RIGHT  05/20/2018   PANCREATIC STENT PLACEMENT  06/24/2021   Procedure: PANCREATIC STENT PLACEMENT;  Surgeon: Clarene Essex, MD;  Location: [place];  Service: Endoscopy;;   REMOVAL OF STONES  06/24/2021   Procedure: REMOVAL OF STONES;  Surgeon: Clarene Essex, MD;  Location: [place];  Service: Endoscopy;;   SPHINCTEROTOMY  06/24/2021   Procedure: Joan Mayans;  Surgeon: Clarene Essex, MD;  Location: [place];  Service: Endoscopy;;   STENT REMOVAL  06/24/2021   Procedure: STENT REMOVAL;  Surgeon: Clarene Essex, MD;  Location: [facility] ENDOSCOPY;  Service: Endoscopy;;   Family History  Problem Relation Age of Onset   Diabetes Maternal Grandmother    Social History:  reports that he has never smoked. He has never used smokeless tobacco. He reports current alcohol use. He reports that he does not use drugs. Allergies  Allergen Reactions   Penicillins Rash and  Other (See Comments)    Tolerating Ceftriaxone (03/12/12) Has patient had a PCN reaction causing immediate rash, facial/tongue/throat swelling, SOB or lightheadedness with hypotension: Yes Has patient had a PCN reaction causing severe rash involving mucus membranes or skin necrosis: Unk Has patient had a PCN reaction that required hospitalization: No Has patient had a PCN  reaction occurring within the last 10 years: No If all of the above answers are "NO", then may proceed with Cephalosporin use.    Prior to Admission medications   Medication Sig Start Date End Date Taking? Authorizing Provider  Ascorbic Acid (VITAMIN C) 1000 MG tablet Take 1,000 mg by mouth daily.   Yes [provider]  aspirin 81 MG tablet Take 81 mg by mouth at bedtime.   Yes [provider]  b complex-vitamin c-folic acid (NEPHRO-VITE) 0.8 MG TABS tablet Take 1 tablet by mouth daily.   Yes [provider]  calcium elemental as carbonate (BARIATRIC TUMS ULTRA) 400 MG chewable tablet Chew 2,000 mg by mouth 3 (three) times daily.   Yes [provider]  carvedilol (COREG) 25 MG tablet Take 1.5 tablets (37.5 mg total) by mouth 2 (two) times daily with a meal. 03/25/17  Yes Troy Sine, MD  docusate sodium (COLACE) 100 MG capsule Take 1 capsule (100 mg total) by mouth 2 (two) times daily. 12/07/21 03/07/22 Yes Lovick, Montel Culver, MD  ibuprofen (ADVIL) 600 MG tablet Take 1 tablet (600 mg total) by mouth every 6 (six) hours as needed. 12/07/21  Yes Lovick, Montel Culver, MD  insulin aspart (NOVOLOG FLEXPEN) 100 UNIT/ML FlexPen 5 to 7 units for breakfast and lunch and 10 to 14 units at dinner based on meal size and carbohydrateS Patient taking differently: Inject 5-14 Units into the skin See admin instructions. 5 to 7 units for breakfast and lunch and 10 to 14 units at dinner based on meal size and carbohydrates. Sliding scale 12/10/19  Yes Elayne Snare, MD  insulin degludec (TRESIBA FLEXTOUCH) 100 UNIT/ML FlexTouch Pen Inject 0.38 mLs (38 Units total) into the skin daily. 04/12/20  Yes Elayne Snare, MD  ivabradine (CORLANOR) 5 MG TABS tablet Take 5 mg by mouth 2 (two) times daily with a meal.   Yes [provider]  lanthanum (FOSRENOL) 1000 MG chewable tablet Chew 1 tablet (1,000 mg total) by mouth 3 (three) times daily with meals. Patient taking differently: Chew  2,000 mg by mouth 3 (three) times daily with meals. 05/25/18  Yes Domenic Polite, MD  methocarbamol (ROBAXIN-750) 750 MG tablet Take 1 tablet (750 mg total) by mouth 4 (four) times daily. 12/07/21  Yes Lovick, Montel Culver, MD  oxyCODONE (ROXICODONE) 5 MG immediate release tablet Take 1 tablet (5 mg total) by mouth every 4 (four) hours as needed for severe pain. 12/07/21  Yes Lovick, Montel Culver, MD  sacubitril-valsartan (ENTRESTO) 49-51 MG Take 1 tablet by mouth 2 (two) times daily.   Yes [provider]  Blood Glucose Monitoring Suppl ([place]) w/Device KIT Use to check blood sugar twice daily. Patient taking differently: 1 each by Other route 2 (two) times daily. 04/21/20   Elayne Snare, MD  Continuous Blood Gluc Receiver (FREESTYLE LIBRE 14 DAY READER) [place] 1 each by Does not apply route See admin instructions. Use to monitor blood sugars. 03/13/20   Elayne Snare, MD  Continuous Blood Gluc Sensor (FREESTYLE LIBRE 2 SENSOR) MISC USE AS DIRECTED Patient taking differently: 1 each by Other route as directed. 01/30/20   Dwyane Dee,  Ajay, MD  glucose blood test strip Use Onetouch verio test strips as instructed to check blood sugar twice daily. Patient taking differently: by Other route See admin instructions. Use Onetouch verio test strips as instructed to check blood sugar twice daily. 04/21/20   Elayne Snare, MD  menthol-cetylpyridinium (CEPACOL) 3 MG lozenge Take 1 lozenge (3 mg total) by mouth as needed for sore throat. Patient not taking: Reported on 12/06/2021 06/26/21   Lavina Hamman, MD  OneTouch Delica Lancets 22Q MISC Use onetouch delica lancets to check blood sugar twice daily. Patient taking differently: 1 each by Other route 2 (two) times daily. 04/21/20   Elayne Snare, MD   Current Facility-Administered Medications  Medication Dose Route Frequency Provider Last Rate Last Admin   acetaminophen (TYLENOL) tablet 1,000 mg  1,000 mg Oral Q6H Saverio Danker, PA-C       calcium  carbonate (TUMS - dosed in mg elemental calcium) chewable tablet 2,000 mg  2,000 mg Oral TID WC Saverio Danker, PA-C       carvedilol (COREG) tablet 37.5 mg  37.5 mg Oral BID WC Saverio Danker, PA-C   37.5 mg at 12/07/21 0724   insulin aspart (novoLOG) injection 0-6 Units  0-6 Units Subcutaneous Q4H Saverio Danker, PA-C       insulin glargine-yfgn [facility]) injection 18 Units  18 Units Subcutaneous QHS Saverio Danker, PA-C   18 Units at 12/07/21 0045   ivabradine (CORLANOR) tablet 5 mg  5 mg Oral BID WC Saverio Danker, PA-C       lanthanum [facility]) chewable tablet 2,000 mg  2,000 mg Oral TID WC Saverio Danker, PA-C       morphine 2 MG/ML injection 1-4 mg  1-4 mg Intravenous Q4H PRN Saverio Danker, PA-C       oxyCODONE (Oxy IR/ROXICODONE) immediate release tablet 5-10 mg  5-10 mg Oral Q4H PRN Saverio Danker, PA-C       sacubitril-valsartan (ENTRESTO) 49-51 mg per tablet  1 tablet Oral BID Saverio Danker, PA-C   1 tablet at 12/07/21 0045     ROS: As per HPI otherwise negative.  Physical Exam: Vitals:   12/07/21 1006 12/07/21 1018 12/07/21 1033 12/07/21 1113  BP: (!) 152/86 (!) 156/88 (!) 154/80 (!) 147/81  Pulse: 62 62 62 60  Resp: _0 Temp: 97.6 F (36.4 C)  97.8 F (36.6 C) 97.9 F (36.6 C)  TempSrc:    Oral  SpO2: 100% 96% 96% 100%  Weight:      Height:         General: Appears comfortable, in no distress, sitting up in bed   Head: NCAT sclera not icteric MMM Neck: Supple. No JVD appreciated  Lungs: Clear bilaterally without wheezes, rales, or rhonchi. Normal WOB  Heart: RRR, no murmur, rub, or gallop  Abdomen: soft non-tender, bowel sounds normal, no masses  Lower extremities:without edema or ischemic changes, no open wounds  Neuro: A & O X 3. Moves all extremities spontaneously. Psych:  Responds to questions appropriately with a normal affect. Dialysis Access: LUE AVF +bruit   Labs: Basic Metabolic Panel: Recent Labs  Lab 12/06/21 1920 12/07/21 0527  NA 135  135  K 4.0 4.1  CL 96* 97*  CO2 30 28  GLUCOSE 151* 93  BUN 26* 31*  CREATININE 4.50* 5.62*  CALCIUM 8.8* 8.3*   Liver Function Tests: Recent Labs  Lab 12/06/21 1920 12/07/21 0527  AST 27 24  ALT 49* 44  ALKPHOS 110 103  BILITOT 0.9 0.9  PROT 6.9 6.3*  ALBUMIN 3.6 2.9*   Recent Labs  Lab 12/06/21 1920  LIPASE 11   No results for input(s): AMMONIA in the last 168 hours. CBC: Recent Labs  Lab 12/06/21 1920 12/07/21 0500  WBC 12.0* 9.5  HGB 10.3* 10.7*  HCT 32.4* 33.8*  MCV 97.6 101.2*  PLT 131* 101*   Cardiac Enzymes: No results for input(s): CKTOTAL, CKMB, CKMBINDEX, TROPONINI in the last 168 hours. CBG: Recent Labs  Lab 12/07/21 0452 12/07/21 0721 12/07/21 1006 12/07/21 1115 12/07/21 1130  GLUCAP 93 83 81 69* 83   Iron Studies: No results for input(s): IRON, TIBC, TRANSFERRIN, FERRITIN in the last 72 hours. Studies/Results: No results found.  Dialysis Orders:  Unit: NW TTS  Time: 4h 15 min  EDW: 84.7 kg  Flows: 400/500  [place]: 2K/2.5Ca  Access: LUE AVF  Heparin: 4000 U Bolus  ESA: 75 mcg q 2 weeks (last 12/8) VDRA: Calcitriol 0.25 mcg   Assessment/Plan: Appendicitis s/p lap appendectomy - per primary team  ESRD -  HD TTS. No urgent dialysis indications today. Likely for discharge tomorrow -will plan for dialysis at his outpatient center. Has appointment at 10:40 am tomorrow for OP HD.  Hypertension/volume  - BP ok. No gross volume on exam. Resume home meds.  Anemia  - Heb 10.7. Resume ESA as outpatient  Metabolic bone disease -  Ca ok. Continue home binders.  Nutrition - Renal diet/fluid restriction  Lynnda Child PA-C [facility] 12/07/2021, 12:19 PM

## 2021-12-07 NOTE — Anesthesia Procedure Notes (Signed)
Procedure Name: Intubation Date/Time: 12/07/2021 8:49 AM Performed by: Amadeo Garnet, CRNA Pre-anesthesia Checklist: Patient identified, Emergency Drugs available, Suction available and Patient being monitored Patient Re-evaluated:Patient Re-evaluated prior to induction Oxygen Delivery Method: Circle system utilized Preoxygenation: Pre-oxygenation with 100% oxygen Induction Type: IV induction Ventilation: Mask ventilation without difficulty Laryngoscope Size: Miller and 2 Grade View: Grade I Tube type: Oral Tube size: 7.5 mm Number of attempts: 2 Airway Equipment and Method: Stylet and Oral airway Placement Confirmation: ETT inserted through vocal cords under direct vision, positive ETCO2 and breath sounds checked- equal and bilateral Secured at: 23 cm Tube secured with: Tape Dental Injury: Teeth and Oropharynx as per pre-operative assessment  Comments: Intubation by Dr. Nyoka Cowden

## 2021-12-07 NOTE — Progress Notes (Signed)
Inpatient Diabetes Program Recommendations  AACE/ADA: New Consensus Statement on Inpatient Glycemic Control (2015)  Target Ranges:  Prepandial:   less than 140 mg/dL      Peak postprandial:   less than 180 mg/dL (1-2 hours)      Critically ill patients:  140 - 180 mg/dL    Latest Reference Range & Units 12/06/21 23:56 12/07/21 04:52 12/07/21 07:21 12/07/21 10:06 12/07/21 11:15 12/07/21 11:30  Glucose-Capillary 70 - 99 mg/dL 118 (H)  18 units Semglee @0045  93 83 81 69 (L) 19     Admit with: Acute Appendicitis  History: DM, ESRD  Home DM Meds: Novolog 5-7 units for Breakfast        Novolog 5-7 units for Lunch        Novolog 10-14 units for Dinner        Tresiba 38 units Daily         Current Orders: Semglee 18 units QHS     Novolog 0-6 units Q4 hours     MD- Note Hypoglycemia at 11am today.  CBGs all less than 95 mg/dl  Please consider reducing the Semglee to 15 units QHS    --Will follow patient during hospitalization--  Wyn Quaker RN, MSN, CDE Diabetes Coordinator Inpatient Glycemic Control Team Team Pager: [phone] (8a-5p)

## 2021-12-07 NOTE — Progress Notes (Signed)
PROGRESS NOTE    [name] [name]  KXF:[id number] DOB: [birth year] DOA: 12/06/2021 PCP: [name], PA-C   Brief Narrative: 55 year old with past medical history significant for ESRD on hemodialysis, insulin-dependent diabetes mellitus, chronic combined systolic and diastolic heart failure, who presents to the ED complaining of abdominal pain, chills and outpatient CT concerning for acute appendicitis.  Patient presented with leukocytosis, afebrile, blood pressure stable.  Surgery and nephrology were consulted by the ED physician.  Patient was a started on IV ceftriaxone and Flagyl.  Patient was admitted for further management.   Assessment & Plan:   Principal Problem:   Acute appendicitis Active Problems:   Insulin-requiring or dependent type II diabetes mellitus ([place])   ESRD on dialysis ([place])   Chronic combined systolic and diastolic CHF (congestive heart failure) (HCC)   1-Acute Appendicitis: -Patient presented with lower abdominal pain, chills and outpatient CT concerning for acute appendicitis. -Surgery has been consulted and planning laparoscopic appendectomy 12/16. -received  IV antibiotics, n.p.o. -underwent appendectomy 12/16. Started on carb modified diet.  Further antibiotics per surgery   2-ESRD on hemodialysis. (T, Thu, Sat) Patient had a hemodialysis 12/06/2021. Plan to resume HD tomorrow out patient.   3-Chronic Combined Systolic and Diastolic Heart Failure: -Appears compensated. -Echo 06/2021: Ejection fraction 30 to 35% with grade 1 diastolic dysfunction. -Continue Coreg ivabradine and Entresto.  4-Insulin-Dependent Diabetes type 2: Hypoglycemia  -A1c 7.7 on July 2022 -Sliding scale insulin Hold Long acting due to hypoglycemia, unclear if patient will tolerate diet.    S/P Cholecystectomy 06/2021     Estimated body mass index is 25.42 kg/m as calculated from the following:   Height as of 07/29/21: 6\' 2"  (1.88 m).   Weight as of 07/29/21:  89.8 kg.   DVT prophylaxis: SCD Code Status: Full code Family Communication: care discussed with patient Disposition Plan:  Status is: Inpatient  Remains inpatient appropriate because: monitor overnight, make sure he tolerates diet.         Consultants:  Nephrology Surgery   Procedures:  Appendectomy  Antimicrobials:    Subjective: He had surgery, mild abdominal discomfort.  CBG 69  Objective: Vitals:   12/06/21 1930 12/06/21 2022 12/07/21 0053 12/07/21 0451  BP: (!) 159/78 (!) 165/82 (!) 156/71 (!) 160/87  Pulse: 69 69 69 69  Resp: 14 17 16 16   Temp:  98.1 F (36.7 C)    TempSrc:  Oral    SpO2: 98% 97% 94% 93%    Intake/Output Summary (Last 24 hours) at 12/07/2021 0751 Last data filed at 12/07/2021 6967 Gross per 24 hour  Intake 345.85 ml  Output --  Net 345.85 ml   There were no vitals filed for this visit.  Examination:  General exam: Appears calm and comfortable  Respiratory system: Clear to auscultation. Respiratory effort normal. Cardiovascular system: S1 & S2 heard, RRR. No JVD, murmurs, rubs, gallops or clicks. No pedal edema. Gastrointestinal system: Abdomen is nondistended, soft and mild tender, incision  Central nervous system: Alert and oriented.  Extremities: Symmetric 5 x 5 power.   Data Reviewed: I have personally reviewed following labs and imaging studies  CBC: Recent Labs  Lab 12/06/21 1920 12/07/21 0500  WBC 12.0* 9.5  HGB 10.3* 10.7*  HCT 32.4* 33.8*  MCV 97.6 101.2*  PLT 131* 893*   Basic Metabolic Panel: Recent Labs  Lab 12/06/21 1920 12/07/21 0527  NA 135 135  K 4.0 4.1  CL 96* 97*  CO2 30 28  GLUCOSE 151* 93  BUN  26* 31*  CREATININE 4.50* 5.62*  CALCIUM 8.8* 8.3*   GFR: CrCl cannot be calculated (Unknown ideal weight.). Liver Function Tests: Recent Labs  Lab 12/06/21 1920 12/07/21 0527  AST 27 24  ALT 49* 44  ALKPHOS 110 103  BILITOT 0.9 0.9  PROT 6.9 6.3*  ALBUMIN 3.6 2.9*   Recent Labs   Lab 12/06/21 1920  LIPASE 11   No results for input(s): AMMONIA in the last 168 hours. Coagulation Profile: No results for input(s): INR, PROTIME in the last 168 hours. Cardiac Enzymes: No results for input(s): CKTOTAL, CKMB, CKMBINDEX, TROPONINI in the last 168 hours. BNP (last 3 results) No results for input(s): PROBNP in the last 8760 hours. HbA1C: Recent Labs    12/07/21 0500  HGBA1C 7.9*   CBG: Recent Labs  Lab 12/06/21 2356 12/07/21 0452 12/07/21 0721  GLUCAP 118* 93 83   Lipid Profile: No results for input(s): CHOL, HDL, LDLCALC, TRIG, CHOLHDL, LDLDIRECT in the last 72 hours. Thyroid Function Tests: No results for input(s): TSH, T4TOTAL, FREET4, T3FREE, THYROIDAB in the last 72 hours. Anemia Panel: No results for input(s): VITAMINB12, FOLATE, FERRITIN, TIBC, IRON, RETICCTPCT in the last 72 hours. Sepsis Labs: No results for input(s): PROCALCITON, LATICACIDVEN in the last 168 hours.  Recent Results (from the past 240 hour(s))  Resp Panel by RT-PCR (Flu A&B, Covid) Nasopharyngeal Swab     Status: None   Collection Time: 12/06/21  7:26 PM   Specimen: Nasopharyngeal Swab; Nasopharyngeal(NP) swabs in vial transport medium  Result Value Ref Range Status   SARS Coronavirus 2 by RT PCR NEGATIVE NEGATIVE Final    Comment: (NOTE) SARS-CoV-2 target nucleic acids are NOT DETECTED.  The SARS-CoV-2 RNA is generally detectable in upper respiratory specimens during the acute phase of infection. The lowest concentration of SARS-CoV-2 viral copies this assay can detect is 138 copies/mL. A negative result does not preclude SARS-Cov-2 infection and should not be used as the sole basis for treatment or other patient management decisions. A negative result may occur with  improper specimen collection/handling, submission of specimen other than nasopharyngeal swab, presence of viral mutation(s) within the areas targeted by this assay, and inadequate number of viral copies(<138  copies/mL). A negative result must be combined with clinical observations, patient history, and epidemiological information. The expected result is Negative.  Fact Sheet for Patients:  EntrepreneurPulse.com.au  Fact Sheet for Healthcare Providers:  IncredibleEmployment.be  This test is no t yet approved or cleared by the [place] FDA and  has been authorized for detection and/or diagnosis of SARS-CoV-2 by FDA under an Emergency Use Authorization (EUA). This EUA will remain  in effect (meaning this test can be used) for the duration of the COVID-19 declaration under Section 564(b)(1) of the Act, 21 U.S.C.section 360bbb-3(b)(1), unless the authorization is terminated  or revoked sooner.       Influenza A by PCR NEGATIVE NEGATIVE Final   Influenza B by PCR NEGATIVE NEGATIVE Final    Comment: (NOTE) The Xpert Xpress SARS-CoV-2/FLU/RSV plus assay is intended as an aid in the diagnosis of influenza from Nasopharyngeal swab specimens and should not be used as a sole basis for treatment. Nasal washings and aspirates are unacceptable for Xpert Xpress SARS-CoV-2/FLU/RSV testing.  Fact Sheet for Patients: EntrepreneurPulse.com.au  Fact Sheet for Healthcare Providers: IncredibleEmployment.be  This test is not yet approved or cleared by the [place] FDA and has been authorized for detection and/or diagnosis of SARS-CoV-2 by FDA under an Emergency Use Authorization (EUA). This  EUA will remain in effect (meaning this test can be used) for the duration of the COVID-19 declaration under Section 564(b)(1) of the Act, 21 U.S.C. section 360bbb-3(b)(1), unless the authorization is terminated or revoked.  Performed at [facility], [address] [place], [place] [address]          Radiology Studies: No results found.      Scheduled Meds:  [MAR Hold] calcium carbonate  2,000 mg  Oral TID WC   [MAR Hold] carvedilol  37.5 mg Oral BID WC   [MAR Hold] insulin aspart  0-6 Units Subcutaneous Q4H   [MAR Hold] insulin glargine-yfgn  18 Units Subcutaneous QHS   [MAR Hold] ivabradine  5 mg Oral BID WC   [MAR Hold] lanthanum  2,000 mg Oral TID WC   [MAR Hold] sacubitril-valsartan  1 tablet Oral BID   Continuous Infusions:  [MAR Hold] ceFEPime (MAXIPIME) IV Stopped (12/07/21 0726)   [MAR Hold] metronidazole       LOS: 1 day    Time spent: 35 minutes    Merlin Ege A Wladyslaw Henrichs, MD [facility]   If 7PM-7AM, please contact night-coverage [email or web address]  12/07/2021, 7:51 AM

## 2021-12-07 NOTE — TOC Transition Note (Signed)
Transition of Care [facility]) - CM/SW Discharge Note   Patient Details  Name: [name] [name] MRN: [id number] Date of Birth: [birth year]  [facility] CM/SW Contact:  Marilu Favre, RN Phone Number: 12/07/2021, 11:14 AM   Clinical Narrative:      Transition of Care Department [facility]) has reviewed patient and no TOC needs have been identified at this time. We will continue to monitor patient advancement through interdisciplinary progression rounds. If new patient transition needs arise , please place a TOC consult               Patient Goals and CMS Choice        Discharge Placement                       Discharge Plan and Services                                     Social Determinants of Health (SDOH) Interventions     Readmission Risk Interventions No flowsheet data found.

## 2021-12-07 NOTE — Anesthesia Preprocedure Evaluation (Signed)
Anesthesia Evaluation  Patient identified by MRN, date of birth, ID band Patient awake    Reviewed: Allergy & Precautions, NPO status , Patient's Chart, lab work & pertinent test results  Airway Mallampati: II  TM Distance: >3 FB     Dental   Pulmonary    breath sounds clear to auscultation       Cardiovascular hypertension, +CHF   Rhythm:Regular Rate:Normal     Neuro/Psych    GI/Hepatic   Endo/Other  diabetes  Renal/GU Renal disease     Musculoskeletal   Abdominal   Peds  Hematology   Anesthesia Other Findings   Reproductive/Obstetrics                             Anesthesia Physical Anesthesia Plan  ASA: 3  Anesthesia Plan: General   Post-op Pain Management:    Induction: Intravenous  PONV Risk Score and Plan: 2 and Ondansetron, Dexamethasone and Midazolam  Airway Management Planned: Oral ETT  Additional Equipment:   Intra-op Plan:   Post-operative Plan: Extubation in OR  Informed Consent: I have reviewed the patients History and Physical, chart, labs and discussed the procedure including the risks, benefits and alternatives for the proposed anesthesia with the patient or authorized representative who has indicated his/her understanding and acceptance.     Dental advisory given  Plan Discussed with: CRNA and Anesthesiologist  Anesthesia Plan Comments:         Anesthesia Quick Evaluation

## 2021-12-07 NOTE — Progress Notes (Signed)
Hypoglycemic Event  CBG: 69  Treatment: 4 oz juice/soda  Symptoms: Hungry  Follow-up CBG: Time:1130 CBG Result:83  Possible Reasons for Event: Inadequate meal intake  Comments/MD notified:Dr. Regalado at bedside    [name] [name]

## 2021-12-07 NOTE — Progress Notes (Signed)
General Surgery Follow Up Note  Subjective:    Overnight Issues:   Objective:  Vital signs for last 24 hours: Temp:  [98.1 F (36.7 C)-98.3 F (36.8 C)] 98.1 F (36.7 C) (12/15 2022) Pulse Rate:  [66-71] 69 (12/16 0451) Resp:  [14-17] 16 (12/16 0451) BP: (152-165)/(71-87) 160/87 (12/16 0451) SpO2:  [93 %-98 %] 93 % (12/16 0451)  Hemodynamic parameters for last 24 hours:    Intake/Output from previous day: 12/15 0701 - 12/16 0700 In: 245.9 [I.V.:45.6; IV Piggyback:200.3] Out: -   Intake/Output this shift: Total I/O In: 100 [IV Piggyback:100] Out: -   Vent settings for last 24 hours:    Physical Exam:  Gen: comfortable, no distress Neuro: non-focal exam HEENT: PERRL Neck: supple CV: RRR Pulm: unlabored breathing Abd: soft, NT GU: clear yellow urine Extr: wwp, no edema   Results for orders placed or performed during the hospital encounter of 12/06/21 (from the past 24 hour(s))  Lipase, blood     Status: None   Collection Time: 12/06/21  7:20 PM  Result Value Ref Range   Lipase 11 11 - 51 U/L  Comprehensive metabolic panel     Status: Abnormal   Collection Time: 12/06/21  7:20 PM  Result Value Ref Range   Sodium 135 135 - 145 mmol/L   Potassium 4.0 3.5 - 5.1 mmol/L   Chloride 96 (L) 98 - 111 mmol/L   CO2 30 22 - 32 mmol/L   Glucose, Bld 151 (H) 70 - 99 mg/dL   BUN 26 (H) 6 - 20 mg/dL   Creatinine, Ser 4.50 (H) 0.61 - 1.24 mg/dL   Calcium 8.8 (L) 8.9 - 10.3 mg/dL   Total Protein 6.9 6.5 - 8.1 g/dL   Albumin 3.6 3.5 - 5.0 g/dL   AST 27 15 - 41 U/L   ALT 49 (H) 0 - 44 U/L   Alkaline Phosphatase 110 38 - 126 U/L   Total Bilirubin 0.9 0.3 - 1.2 mg/dL   GFR, Estimated 15 (L) >60 mL/min   Anion gap 9 5 - 15  CBC     Status: Abnormal   Collection Time: 12/06/21  7:20 PM  Result Value Ref Range   WBC 12.0 (H) 4.0 - 10.5 K/uL   RBC 3.32 (L) 4.22 - 5.81 MIL/uL   Hemoglobin 10.3 (L) 13.0 - 17.0 g/dL   HCT 32.4 (L) 39.0 - 52.0 %   MCV 97.6 80.0 - 100.0 fL    MCH 31.0 26.0 - 34.0 pg   MCHC 31.8 30.0 - 36.0 g/dL   RDW 14.4 11.5 - 15.5 %   Platelets 131 (L) 150 - 400 K/uL   nRBC 0.0 0.0 - 0.2 %  Resp Panel by RT-PCR (Flu A&B, Covid) Nasopharyngeal Swab     Status: None   Collection Time: 12/06/21  7:26 PM   Specimen: Nasopharyngeal Swab; Nasopharyngeal(NP) swabs in vial transport medium  Result Value Ref Range   SARS Coronavirus 2 by RT PCR NEGATIVE NEGATIVE   Influenza A by PCR NEGATIVE NEGATIVE   Influenza B by PCR NEGATIVE NEGATIVE  CBG monitoring, ED     Status: Abnormal   Collection Time: 12/06/21 11:56 PM  Result Value Ref Range   Glucose-Capillary 118 (H) 70 - 99 mg/dL  CBG monitoring, ED     Status: None   Collection Time: 12/07/21  4:52 AM  Result Value Ref Range   Glucose-Capillary 93 70 - 99 mg/dL  Hemoglobin A1c     Status: Abnormal  Collection Time: 12/07/21  5:00 AM  Result Value Ref Range   Hgb A1c MFr Bld 7.9 (H) 4.8 - 5.6 %   Mean Plasma Glucose 180.03 mg/dL  CBC     Status: Abnormal   Collection Time: 12/07/21  5:00 AM  Result Value Ref Range   WBC 9.5 4.0 - 10.5 K/uL   RBC 3.34 (L) 4.22 - 5.81 MIL/uL   Hemoglobin 10.7 (L) 13.0 - 17.0 g/dL   HCT 33.8 (L) 39.0 - 52.0 %   MCV 101.2 (H) 80.0 - 100.0 fL   MCH 32.0 26.0 - 34.0 pg   MCHC 31.7 30.0 - 36.0 g/dL   RDW 14.2 11.5 - 15.5 %   Platelets 101 (L) 150 - 400 K/uL   nRBC 0.0 0.0 - 0.2 %  Basic metabolic panel     Status: Abnormal   Collection Time: 12/07/21  5:27 AM  Result Value Ref Range   Sodium 135 135 - 145 mmol/L   Potassium 4.1 3.5 - 5.1 mmol/L   Chloride 97 (L) 98 - 111 mmol/L   CO2 28 22 - 32 mmol/L   Glucose, Bld 93 70 - 99 mg/dL   BUN 31 (H) 6 - 20 mg/dL   Creatinine, Ser 5.62 (H) 0.61 - 1.24 mg/dL   Calcium 8.3 (L) 8.9 - 10.3 mg/dL   GFR, Estimated 11 (L) >60 mL/min   Anion gap 10 5 - 15  Hepatic function panel     Status: Abnormal   Collection Time: 12/07/21  5:27 AM  Result Value Ref Range   Total Protein 6.3 (L) 6.5 - 8.1 g/dL    Albumin 2.9 (L) 3.5 - 5.0 g/dL   AST 24 15 - 41 U/L   ALT 44 0 - 44 U/L   Alkaline Phosphatase 103 38 - 126 U/L   Total Bilirubin 0.9 0.3 - 1.2 mg/dL   Bilirubin, Direct 0.2 0.0 - 0.2 mg/dL   Indirect Bilirubin 0.7 0.3 - 0.9 mg/dL  CBG monitoring, ED     Status: None   Collection Time: 12/07/21  7:21 AM  Result Value Ref Range   Glucose-Capillary 83 70 - 99 mg/dL    Assessment & Plan:  Present on Admission:  Acute appendicitis  Chronic combined systolic and diastolic CHF (congestive heart failure) (HCC)    LOS: 1 day   Additional comments:I reviewed the patient's new clinical lab test results.   and I reviewed the patients new imaging test results.    Acute appendicitis - lap appy today. Informed consent was obtained after detailed explanation of risks, including bleeding, infection, abscess, staple line leak, stump appendicitis, injury to surrounding structures, and need for conversion to open procedure. All questions answered to the patient's satisfaction. FEN - NPO DVT - SCDs, LMWH Dispo - OR today    Jesusita Oka, MD Trauma & General Surgery Please use [email or web address] to contact on call provider  12/07/2021  *Care during the described time interval was provided by me. I have reviewed this patient's available data, including medical history, events of note, physical examination and test results as part of my evaluation.

## 2021-12-07 NOTE — Progress Notes (Signed)
Out Patient Arrangements:  Noted pt is to d/c today. His HD clinic is aware & expecting him tomorrow @ his normal time.   Thanks Linus Orn [facility] [phone]

## 2021-12-07 NOTE — Anesthesia Postprocedure Evaluation (Signed)
Anesthesia Post Note  Patient: [name] [name]  Procedure(s) Performed: APPENDECTOMY LAPAROSCOPIC (Abdomen)     Patient location during evaluation: PACU Anesthesia Type: General Level of consciousness: awake Pain management: pain level controlled Vital Signs Assessment: post-procedure vital signs reviewed and stable Respiratory status: spontaneous breathing Cardiovascular status: stable Anesthetic complications: no   No notable events documented.  Last Vitals:  Vitals:   12/07/21 1006 12/07/21 1018  BP: (!) 152/86 (!) 156/88  Pulse: 62 62  Resp: 16 12  Temp: 36.4 C   SpO2: 100% 96%    Last Pain:  Vitals:   12/07/21 1006  TempSrc:   PainSc: Asleep                 Millianna Szymborski

## 2021-12-07 NOTE — Discharge Instructions (Addendum)
May shower beginning 12/08/2021. Do not peel off or scrub skin glue. May allow warm soapy water to run over incision, then rinse and pat dry. Do not soak in any water (tubs, hot tubs, pools, lakes, oceans) for one week.   No lifting greater than 5 pounds for six weeks.   Pain regimen: take over-the-counter tylenol (acetaminophen) 1000mg  every six hours, the prescription ibuprofen (600mg ) every six hours and the robaxin (methocarbamol) 750mg  every six hours. With all three of these, you should be taking something every two hours. Example: tylenol ( acetaminophen) at 8am, ibuprofen at 10am, robaxin (methocarbamol) at 12pm, tylenol (acetaminophen) again at 2pm, ibuprofen again at 4pm, robaxin (methocarbamol) at 6pm. You also have a prescription for oxycodone, which should be taken if the tylenol (acetaminophen), ibuprofen, and robaxin (methocarbamol) are not enough to control your pain. You may take the oxycodone as frequently as every four hours as needed, but if you are taking the other medications as above, you should not need the oxycodone this frequently. You have also been given a prescription for colace (docusate) which is a stool softener. Please take this as prescribed because the oxycodone can cause constipation and the colace (docusate) will minimize or prevent constipation.  Call the office at [phone] for temperature greater than 101.50F, worsening pain, redness or warmth at the incision site.  Please call [phone] to make an appointment for 2-3 weeks after surgery for wound check.    [facility]  Please arrive at least 30 min before your appointment to complete your check in paperwork.  If you are unable to arrive 30 min prior to your appointment time we may have to cancel or reschedule you. LAPAROSCOPIC SURGERY: POST OP INSTRUCTIONS Always review your discharge instruction sheet given to you by the facility where your surgery was performed. IF YOU HAVE  DISABILITY OR FAMILY LEAVE FORMS, YOU MUST BRING THEM TO THE OFFICE FOR PROCESSING.   DO NOT GIVE THEM TO YOUR DOCTOR.  PAIN CONTROL  First take acetaminophen (Tylenol) AND/or ibuprofen (Advil) to control your pain after surgery.  Follow directions on package.  Taking acetaminophen (Tylenol) and/or ibuprofen (Advil) regularly after surgery will help to control your pain and lower the amount of prescription pain medication you may need.  You should not take more than 4,000 mg (4 grams) of acetaminophen (Tylenol) in 24 hours.  You should not take ibuprofen (Advil), aleve, motrin, naprosyn or other NSAIDS if you have a history of stomach ulcers or chronic kidney disease.  A prescription for pain medication may be given to you upon discharge.  Take your pain medication as prescribed, if you still have uncontrolled pain after taking acetaminophen (Tylenol) or ibuprofen (Advil). Use ice packs to help control pain. If you need a refill on your pain medication, please contact your pharmacy.  They will contact our office to request authorization. Prescriptions will not be filled after 5pm or on week-ends.  HOME MEDICATIONS Take your usually prescribed medications unless otherwise directed.  DIET You should follow a light diet the first few days after arrival home.  Be sure to include lots of fluids daily. Avoid fatty, fried foods.   CONSTIPATION It is common to experience some constipation after surgery and if you are taking pain medication.  Increasing fluid intake and taking a stool softener (such as Colace) will usually help or prevent this problem from occurring.  A mild laxative (Milk of Magnesia or Miralax) should be taken according to package instructions if  there are no bowel movements after 48 hours.  WOUND/INCISION CARE Most patients will experience some swelling and bruising in the area of the incisions.  Ice packs will help.  Swelling and bruising can take several days to resolve.  Unless  discharge instructions indicate otherwise, follow guidelines below  STERI-STRIPS - you may remove your outer bandages 48 hours after surgery, and you may shower at that time.  You have steri-strips (small skin tapes) in place directly over the incision.  These strips should be left on the skin for 7-10 days.   DERMABOND/SKIN GLUE - you may shower in 24 hours.  The glue will flake off over the next 2-3 weeks. Any sutures or staples will be removed at the office during your follow-up visit.  ACTIVITIES You may resume regular (light) daily activities beginning the next day--such as daily self-care, walking, climbing stairs--gradually increasing activities as tolerated.  You may have sexual intercourse when it is comfortable.  Refrain from any heavy lifting or straining until approved by your doctor. You may drive when you are no longer taking prescription pain medication, you can comfortably wear a seatbelt, and you can safely maneuver your car and apply brakes.  FOLLOW-UP You should see your doctor in the office for a follow-up appointment approximately 2-3 weeks after your surgery.  You should have been given your post-op/follow-up appointment when your surgery was scheduled.  If you did not receive a post-op/follow-up appointment, make sure that you call for this appointment within a day or two after you arrive home to insure a convenient appointment time.   WHEN TO CALL YOUR DOCTOR: Fever over 101.0 Inability to urinate Continued bleeding from incision. Increased pain, redness, or drainage from the incision. Increasing abdominal pain  The clinic staff is available to answer your questions during regular business hours.  Please dont hesitate to call and ask to speak to one of the nurses for clinical concerns.  If you have a medical emergency, go to the nearest emergency room or call 911.  A surgeon from [facility] is always on call at the hospital. [address], [place], [place], [place]  [address] ? [address], [place], [place]   [address] [phone] ? [phone] ? FAX [phone]

## 2021-12-07 NOTE — Transfer of Care (Signed)
Immediate Anesthesia Transfer of Care Note  Patient: [name] [name]  Procedure(s) Performed: APPENDECTOMY LAPAROSCOPIC (Abdomen)  Patient Location: PACU  Anesthesia Type:General  Level of Consciousness: awake and drowsy  Airway & Oxygen Therapy: Patient Spontanous Breathing and Patient connected to face mask oxygen  Post-op Assessment: Report given to RN, Post -op Vital signs reviewed and stable and Patient moving all extremities  Post vital signs: Reviewed and stable  Last Vitals:  Vitals Value Taken Time  BP 152/86 12/07/21 1003  Temp    Pulse 63 12/07/21 1007  Resp 21 12/07/21 1007  SpO2 99 % 12/07/21 1007  Vitals shown include unvalidated device data.  Last Pain:  Vitals:   12/07/21 0812  TempSrc: Oral  PainSc:          Complications: No notable events documented.

## 2021-12-07 NOTE — Op Note (Signed)
° °  Operative Note   Date: 12/07/2021  Procedure: laparoscopic appendectomy  Pre-op diagnosis: acute appendicitis Post-op diagnosis: Grade 1b appendicitis, retained gallstones  Indication and clinical history: The patient is a 55 y.o. year old male with acute appendicitis     Surgeon: Jesusita Oka, MD  Anesthesiologist: Nyoka Cowden, MD Anesthesia: General  Findings:  Specimen: appendix, pelvic foreign bodies EBL: <5cc Drains/Implants: none  Disposition: PACU - hemodynamically stable.  Description of procedure: The patient was positioned supine on the operating room table. Time-out was performed verifying correct patient, procedure, signature of informed consent, and administration of pre-operative antibiotics. General anesthetic induction and intubation were uneventful. Foley catheter insertion  was not performed as the patient does not void  . The abdomen was prepped and draped in the usual sterile fashion. An infra-umbilical incision was made using an open technique using zero vicryl stay sutures on either side of the fascia and a 75mm Hassan port inserted. After establishing pneumoperitoneum, which the patient tolerated well, the abdominal cavity was inspected and no injury of any intra-abdominal structures was identified. Two additional five millimeter ports were placed under direct visualization and using local anesthetic in the suprapubic and left lower quadrant regions. The patient was repositioned to Trendelenburg with the left side down. Further inspection of the right lower quadrant revealed no abscess or purulent fluid and grade 1b appendicitis with the ascending colon adhesed to the abdominal wall. Dissection, mobilization, and identification of the appendix was performed. The appendix was dissected away from its mesoappendix and an endoscopic stapler used to divide the mesoappendix using a vascular load. A bowel load of the endoscopic stapler was used to staple across the appendix  at its base. Both staple lines were inspected and found to be intact and without bleeding. The appendix was placed in an endoscopic specimen retrieval bag, removed via the umbilical port site, and sent to pathology as a permanent specimen. The right lower quadrant was again inspected and hemostasis confirmed. Free fluid was identified in the pelvis and during removal of this fluid, multiple foreign bodies were identified in the pelvis that appeared to be gallstones. These were extracted and also sent to pathology. The suprapubic and left lower quadrant ports were removed under direct visualization and hemostasis confirmed. The umbilical port was removed last after desufflating the abdomen and the fascia re-approximated using the stay sutures. Additional local anesthetic was administered at the umbilical incision site. The skin of all port sites was closed with 4-0 monocryl. Sterile dressings were applied. All sponge and instrument counts were correct at the conclusion of the procedure. The patient was awakened from anesthesia, extubated uneventfully, and transported to the PACU in good condition. There were no complications.   Upon entering the abdomen (organ space), I encountered infection of the appendix .  CASE DATA:  Type of patient?: DOW CASE (Surgical Hospitalist [facility])  Status of Case? URGENT Add On  Infection Present At Time Of Surgery (PATOS)?  INFECTION of the appendix   Jesusita Oka, MD General and [place] [facility]

## 2021-12-08 ENCOUNTER — Encounter (HOSPITAL_COMMUNITY): Payer: Self-pay | Admitting: Surgery

## 2021-12-08 DIAGNOSIS — K3580 Unspecified acute appendicitis: Secondary | ICD-10-CM | POA: Diagnosis not present

## 2021-12-08 LAB — BASIC METABOLIC PANEL
Anion gap: 8 (ref 5–15)
BUN: 50 mg/dL — ABNORMAL HIGH (ref 6–20)
CO2: 31 mmol/L (ref 22–32)
Calcium: 7.9 mg/dL — ABNORMAL LOW (ref 8.9–10.3)
Chloride: 93 mmol/L — ABNORMAL LOW (ref 98–111)
Creatinine, Ser: 6.82 mg/dL — ABNORMAL HIGH (ref 0.61–1.24)
GFR, Estimated: 9 mL/min — ABNORMAL LOW (ref >60)
Glucose, Bld: 192 mg/dL — ABNORMAL HIGH (ref 70–99)
Potassium: 5 mmol/L (ref 3.5–5.1)
Sodium: 132 mmol/L — ABNORMAL LOW (ref 135–145)

## 2021-12-08 LAB — CBC
HCT: 32.6 % — ABNORMAL LOW (ref 39.0–52.0)
Hemoglobin: 10.7 g/dL — ABNORMAL LOW (ref 13.0–17.0)
MCH: 32.3 pg (ref 26.0–34.0)
MCHC: 32.8 g/dL (ref 30.0–36.0)
MCV: 98.5 fL (ref 80.0–100.0)
Platelets: 158 10*3/uL (ref 150–400)
RBC: 3.31 MIL/uL — ABNORMAL LOW (ref 4.22–5.81)
RDW: 14 % (ref 11.5–15.5)
WBC: 8.3 10*3/uL (ref 4.0–10.5)
nRBC: 0.4 % — ABNORMAL HIGH (ref 0.0–0.2)

## 2021-12-08 LAB — MRSA NEXT GEN BY PCR, NASAL: MRSA by PCR Next Gen: NOT DETECTED

## 2021-12-08 MED ORDER — INSULIN ASPART 100 UNIT/ML IJ SOLN: 0.0000 [IU] | Freq: Three times a day (TID) | INTRAMUSCULAR | Status: DC

## 2021-12-08 MED ORDER — TRESIBA FLEXTOUCH 100 UNIT/ML ~~LOC~~ SOPN: 30.0000 [IU] | PEN_INJECTOR | Freq: Every day | SUBCUTANEOUS | 1 refills | Status: DC

## 2021-12-08 MED ORDER — INSULIN ASPART 100 UNIT/ML IJ SOLN
0.0000 [IU] | Freq: Three times a day (TID) | INTRAMUSCULAR | Status: DC
  Administered 2021-12-08: 2 [IU] via SUBCUTANEOUS

## 2021-12-08 MED ORDER — NOVOLOG FLEXPEN 100 UNIT/ML ~~LOC~~ SOPN: PEN_INJECTOR | SUBCUTANEOUS | 1 refills | Status: DC

## 2021-12-08 MED ORDER — TRESIBA FLEXTOUCH 100 UNIT/ML ~~LOC~~ SOPN: 25.0000 [IU] | PEN_INJECTOR | Freq: Every day | SUBCUTANEOUS | 1 refills | Status: DC

## 2021-12-08 NOTE — Discharge Summary (Signed)
Physician Discharge Summary  [name] [name] MPN:[id number] DOB: 06-06-1966 DOA: 12/06/2021  PCP: [name], PA-C  Admit date: 12/06/2021 Discharge date: 12/08/2021  Admitted From: Home  Disposition: Home   Recommendations for Outpatient Follow-up:  Follow up with PCP in 1-2 weeks Please obtain BMP/CBC in one week Follow up with Sx post appendectomy     Discharge Condition: Stable.  CODE STATUS: Full code Diet recommendation: Heart Healthy   Brief/Interim Summary: 55 year old with past medical history significant for ESRD on hemodialysis, insulin-dependent diabetes mellitus, chronic combined systolic and diastolic heart failure, who presents to the ED complaining of abdominal pain, chills and outpatient CT concerning for acute appendicitis.   Patient presented with leukocytosis, afebrile, blood pressure stable.  Surgery and nephrology were consulted by the ED physician.  Patient was a started on IV ceftriaxone and Flagyl.  Patient was admitted for further management.    1-Acute Appendicitis: -Patient presented with lower abdominal pain, chills and outpatient CT concerning for acute appendicitis. -Surgery has been consulted and planning laparoscopic appendectomy 12/16. -received  IV antibiotics, n.p.o. -underwent appendectomy 12/16. Started on carb modified diet.  No further antibiotics ordered by surgery   stable for discharge.  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 Advised not do heavy lifting.   2-ESRD on hemodialysis. (T, Thu, Sat) Patient had a hemodialysis 12/06/2021. He will go Out patient HD> today   3-Chronic Combined Systolic and Diastolic Heart Failure: -Appears compensated. -Echo 06/2021: Ejection fraction 30 to 35% with grade 1 diastolic dysfunction. -Continue Coreg ivabradine and Entresto.  stable.   4-Insulin-Dependent Diabetes type 2: Hypoglycemia  -A1c 7.7 on July 2022 -Sliding scale insulin Resume tresiba 30 units  daily      S/P Cholecystectomy 06/2021     Discharge Diagnoses:  Principal Problem:   Acute appendicitis Active Problems:   Insulin-requiring or dependent type II diabetes mellitus ([place])   ESRD on dialysis ([place])   Chronic combined systolic and diastolic CHF (congestive heart failure) [facility])    Discharge Instructions  Discharge Instructions     Diet - low sodium heart healthy   Complete by: As directed    Discharge wound care:   Complete by: As directed    See Surgery recommendations   Increase activity slowly   Complete by: As directed       Allergies as of 12/08/2021       Reactions   Penicillins Rash, Other (See Comments)   Tolerating Ceftriaxone (03/12/12) Has patient had a PCN reaction causing immediate rash, facial/tongue/throat swelling, SOB or lightheadedness with hypotension: Yes Has patient had a PCN reaction causing severe rash involving mucus membranes or skin necrosis: Unk Has patient had a PCN reaction that required hospitalization: No Has patient had a PCN reaction occurring within the last 10 years: No If all of the above answers are "NO", then may proceed with Cephalosporin use.        Medication List     TAKE these medications    aspirin 81 MG tablet Take 81 mg by mouth at bedtime.   b complex-vitamin c-folic acid 0.8 MG Tabs tablet Take 1 tablet by mouth daily.   calcium elemental as carbonate 400 MG chewable tablet Commonly known as: BARIATRIC TUMS ULTRA Chew 2,000 mg by mouth 3 (three) times daily.   carvedilol 25 MG tablet Commonly known as: COREG Take 1.5 tablets (37.5 mg total) by mouth 2 (two) times daily with a meal.   docusate sodium 100 MG capsule Commonly  known as: Colace Take 1 capsule (100 mg total) by mouth 2 (two) times daily.   Entresto 49-51 MG Generic drug: sacubitril-valsartan Take 1 tablet by mouth 2 (two) times daily.   FreeStyle Libre 14 Day Reader Kerrin Mo 1 each by Does not apply route See admin instructions.  Use to monitor blood sugars.   FreeStyle Libre 2 Sensor Misc USE AS DIRECTED What changed:  how much to take how to take this when to take this   glucose blood test strip Use Onetouch verio test strips as instructed to check blood sugar twice daily. What changed:  how to take this when to take this   ibuprofen 600 MG tablet Commonly known as: ADVIL Take 1 tablet (600 mg total) by mouth every 6 (six) hours as needed.   ivabradine 5 MG Tabs tablet Commonly known as: CORLANOR Take 5 mg by mouth 2 (two) times daily with a meal.   lanthanum 1000 MG chewable tablet Commonly known as: FOSRENOL Chew 1 tablet (1,000 mg total) by mouth 3 (three) times daily with meals. What changed: how much to take   menthol-cetylpyridinium 3 MG lozenge Commonly known as: CEPACOL Take 1 lozenge (3 mg total) by mouth as needed for sore throat.   methocarbamol 750 MG tablet Commonly known as: Robaxin-750 Take 1 tablet (750 mg total) by mouth 4 (four) times daily.   NovoLOG FlexPen 100 UNIT/ML FlexPen Generic drug: insulin aspart 5 to 7 units for breakfast and lunch and 10 to 14 units at dinner based on meal size and carbohydrateS   CBG 70 - 120: 0 units  CBG 121 - 150: 0 units  CBG 151 - 200: 1 unit  CBG 201-250: 1 units  CBG 251-300: 2 units  CBG 301-350: 3 units  CBG 351-400: 4 units What changed: additional instructions   OneTouch Delica Lancets 75I Misc Use onetouch delica lancets to check blood sugar twice daily. What changed:  how much to take how to take this when to take this additional instructions   OneTouch Verio Flex System w/Device Kit Use to check blood sugar twice daily. What changed:  how much to take how to take this when to take this additional instructions   oxyCODONE 5 MG immediate release tablet Commonly known as: Roxicodone Take 1 tablet (5 mg total) by mouth every 4 (four) hours as needed for severe pain.   Tyler Aas FlexTouch 100 UNIT/ML FlexTouch  Pen Generic drug: insulin degludec Inject 30 Units into the skin daily. What changed: how much to take   vitamin C 1000 MG tablet Take 1,000 mg by mouth daily.               Discharge Care Instructions  (From admission, onward)           Start     Ordered   12/08/21 0000  Discharge wound care:       Comments: See Surgery recommendations   12/08/21 0724            Follow-up Information     Surgery, [facility] Follow up on 12/27/2021.   Specialty: General Surgery Why: 2:45pm, arrive by 2:15pm for paperwork and check in process.  Please bring your insurance card and photo ID Contact information: [address] [place] [address] [phone]                Allergies  Allergen Reactions   Penicillins Rash and Other (See Comments)    Tolerating Ceftriaxone (03/12/12) Has  patient had a PCN reaction causing immediate rash, facial/tongue/throat swelling, SOB or lightheadedness with hypotension: Yes Has patient had a PCN reaction causing severe rash involving mucus membranes or skin necrosis: Unk Has patient had a PCN reaction that required hospitalization: No Has patient had a PCN reaction occurring within the last 10 years: No If all of the above answers are "NO", then may proceed with Cephalosporin use.     Consultations: Surgery    Procedures/Studies: No results found.   Subjective: Report mild abdominal soreness. Passing gas.   Discharge Exam: Vitals:   12/07/21 2347 12/08/21 0450  BP: 133/73 139/81  Pulse: 64 64  Resp: 18 19  Temp: 98 F (36.7 C) 98.6 F (37 C)  SpO2: 98% 96%     General: Pt is alert, awake, not in acute distress Cardiovascular: RRR, S1/S2 +, no rubs, no gallops Respiratory: CTA bilaterally, no wheezing, no rhonchi Abdominal: Soft, NT, ND, bowel sounds + Extremities: no edema, no cyanosis    The results of significant diagnostics from this hospitalization (including imaging, microbiology,  ancillary and laboratory) are listed below for reference.     Microbiology: Recent Results (from the past 240 hour(s))  Resp Panel by RT-PCR (Flu A&B, Covid) Nasopharyngeal Swab     Status: None   Collection Time: 12/06/21  7:26 PM   Specimen: Nasopharyngeal Swab; Nasopharyngeal(NP) swabs in vial transport medium  Result Value Ref Range Status   SARS Coronavirus 2 by RT PCR NEGATIVE NEGATIVE Final    Comment: (NOTE) SARS-CoV-2 target nucleic acids are NOT DETECTED.  The SARS-CoV-2 RNA is generally detectable in upper respiratory specimens during the acute phase of infection. The lowest concentration of SARS-CoV-2 viral copies this assay can detect is 138 copies/mL. A negative result does not preclude SARS-Cov-2 infection and should not be used as the sole basis for treatment or other patient management decisions. A negative result may occur with  improper specimen collection/handling, submission of specimen other than nasopharyngeal swab, presence of viral mutation(s) within the areas targeted by this assay, and inadequate number of viral copies(<138 copies/mL). A negative result must be combined with clinical observations, patient history, and epidemiological information. The expected result is Negative.  Fact Sheet for Patients:  EntrepreneurPulse.com.au  Fact Sheet for Healthcare Providers:  IncredibleEmployment.be  This test is no t yet approved or cleared by the [place] FDA and  has been authorized for detection and/or diagnosis of SARS-CoV-2 by FDA under an Emergency Use Authorization (EUA). This EUA will remain  in effect (meaning this test can be used) for the duration of the COVID-19 declaration under Section 564(b)(1) of the Act, 21 U.S.C.section 360bbb-3(b)(1), unless the authorization is terminated  or revoked sooner.       Influenza A by PCR NEGATIVE NEGATIVE Final   Influenza B by PCR NEGATIVE NEGATIVE Final     Comment: (NOTE) The Xpert Xpress SARS-CoV-2/FLU/RSV plus assay is intended as an aid in the diagnosis of influenza from Nasopharyngeal swab specimens and should not be used as a sole basis for treatment. Nasal washings and aspirates are unacceptable for Xpert Xpress SARS-CoV-2/FLU/RSV testing.  Fact Sheet for Patients: EntrepreneurPulse.com.au  Fact Sheet for Healthcare Providers: IncredibleEmployment.be  This test is not yet approved or cleared by the [place] FDA and has been authorized for detection and/or diagnosis of SARS-CoV-2 by FDA under an Emergency Use Authorization (EUA). This EUA will remain in effect (meaning this test can be used) for the duration of the COVID-19 declaration under Section 564(b)(1)  of the Act, 21 U.S.C. section 360bbb-3(b)(1), unless the authorization is terminated or revoked.  Performed at [facility], [address] [place], [place] [address]   MRSA Next Gen by PCR, Nasal     Status: None   Collection Time: 12/07/21  1:30 PM   Specimen: Nasal Mucosa; Nasal Swab  Result Value Ref Range Status   MRSA by PCR Next Gen NOT DETECTED NOT DETECTED Final    Comment: (NOTE) The GeneXpert MRSA Assay (FDA approved for NASAL specimens only), is one component of a comprehensive MRSA colonization surveillance program. It is not intended to diagnose MRSA infection nor to guide or monitor treatment for MRSA infections. Test performance is not FDA approved in patients less than 68 years old. Performed at [facility], [place] [address] [place], [place] [address]      Labs: BNP (last 3 results) Recent Labs    06/23/21 0824  BNP 462.7*   Basic Metabolic Panel: Recent Labs  Lab 12/06/21 1920 12/07/21 0527 12/08/21 0159  NA 135 135 132*  K 4.0 4.1 5.0  CL 96* 97* 93*  CO2 _0 GLUCOSE 151* 93 192*  BUN 26* 31* 50*  CREATININE 4.50* 5.62* 6.82*  CALCIUM 8.8* 8.3* 7.9*   Liver  Function Tests: Recent Labs  Lab 12/06/21 1920 12/07/21 0527  AST 27 24  ALT 49* 44  ALKPHOS 110 103  BILITOT 0.9 0.9  PROT 6.9 6.3*  ALBUMIN 3.6 2.9*   Recent Labs  Lab 12/06/21 1920  LIPASE 11   No results for input(s): AMMONIA in the last 168 hours. CBC: Recent Labs  Lab 12/06/21 1920 12/07/21 0500 12/08/21 0159  WBC 12.0* 9.5 8.3  HGB 10.3* 10.7* 10.7*  HCT 32.4* 33.8* 32.6*  MCV 97.6 101.2* 98.5  PLT 131* 101* 158   Cardiac Enzymes: No results for input(s): CKTOTAL, CKMB, CKMBINDEX, TROPONINI in the last 168 hours. BNP: Invalid input(s): POCBNP CBG: Recent Labs  Lab 12/07/21 1006 12/07/21 1115 12/07/21 1130 12/07/21 1617 12/07/21 2217  GLUCAP 81 69* 83 278* 239*   D-Dimer No results for input(s): DDIMER in the last 72 hours. Hgb A1c Recent Labs    12/07/21 0500  HGBA1C 7.9*   Lipid Profile No results for input(s): CHOL, HDL, LDLCALC, TRIG, CHOLHDL, LDLDIRECT in the last 72 hours. Thyroid function studies No results for input(s): TSH, T4TOTAL, T3FREE, THYROIDAB in the last 72 hours.  Invalid input(s): FREET3 Anemia work up No results for input(s): VITAMINB12, FOLATE, FERRITIN, TIBC, IRON, RETICCTPCT in the last 72 hours. Urinalysis    Component Value Date/Time   COLORURINE YELLOW 05/20/2018 0711   APPEARANCEUR HAZY (A) 05/20/2018 0711   LABSPEC 1.015 05/20/2018 0711   PHURINE 5.5 05/20/2018 0711   GLUCOSEU NEGATIVE 05/20/2018 0711   HGBUR SMALL (A) 05/20/2018 0711   BILIRUBINUR NEGATIVE 05/20/2018 0711   BILIRUBINUR negative 04/20/2018 1452   KETONESUR NEGATIVE 05/20/2018 0711   PROTEINUR 100 (A) 05/20/2018 0711   UROBILINOGEN 0.2 04/20/2018 1452   UROBILINOGEN 0.2 04/09/2012 2229   NITRITE NEGATIVE 05/20/2018 0711   LEUKOCYTESUR NEGATIVE 05/20/2018 0711   Sepsis Labs Invalid input(s): PROCALCITONIN,  WBC,  LACTICIDVEN Microbiology Recent Results (from the past 240 hour(s))  Resp Panel by RT-PCR (Flu A&B, Covid) Nasopharyngeal Swab      Status: None   Collection Time: 12/06/21  7:26 PM   Specimen: Nasopharyngeal Swab; Nasopharyngeal(NP) swabs in vial transport medium  Result Value Ref Range Status   SARS Coronavirus 2 by  RT PCR NEGATIVE NEGATIVE Final    Comment: (NOTE) SARS-CoV-2 target nucleic acids are NOT DETECTED.  The SARS-CoV-2 RNA is generally detectable in upper respiratory specimens during the acute phase of infection. The lowest concentration of SARS-CoV-2 viral copies this assay can detect is 138 copies/mL. A negative result does not preclude SARS-Cov-2 infection and should not be used as the sole basis for treatment or other patient management decisions. A negative result may occur with  improper specimen collection/handling, submission of specimen other than nasopharyngeal swab, presence of viral mutation(s) within the areas targeted by this assay, and inadequate number of viral copies(<138 copies/mL). A negative result must be combined with clinical observations, patient history, and epidemiological information. The expected result is Negative.  Fact Sheet for Patients:  EntrepreneurPulse.com.au  Fact Sheet for Healthcare Providers:  IncredibleEmployment.be  This test is no t yet approved or cleared by the [place] FDA and  has been authorized for detection and/or diagnosis of SARS-CoV-2 by FDA under an Emergency Use Authorization (EUA). This EUA will remain  in effect (meaning this test can be used) for the duration of the COVID-19 declaration under Section 564(b)(1) of the Act, 21 U.S.C.section 360bbb-3(b)(1), unless the authorization is terminated  or revoked sooner.       Influenza A by PCR NEGATIVE NEGATIVE Final   Influenza B by PCR NEGATIVE NEGATIVE Final    Comment: (NOTE) The Xpert Xpress SARS-CoV-2/FLU/RSV plus assay is intended as an aid in the diagnosis of influenza from Nasopharyngeal swab specimens and should not be used as a sole  basis for treatment. Nasal washings and aspirates are unacceptable for Xpert Xpress SARS-CoV-2/FLU/RSV testing.  Fact Sheet for Patients: EntrepreneurPulse.com.au  Fact Sheet for Healthcare Providers: IncredibleEmployment.be  This test is not yet approved or cleared by the [place] FDA and has been authorized for detection and/or diagnosis of SARS-CoV-2 by FDA under an Emergency Use Authorization (EUA). This EUA will remain in effect (meaning this test can be used) for the duration of the COVID-19 declaration under Section 564(b)(1) of the Act, 21 U.S.C. section 360bbb-3(b)(1), unless the authorization is terminated or revoked.  Performed at [facility], [address], [place], [place] [address]   MRSA Next Gen by PCR, Nasal     Status: None   Collection Time: 12/07/21  1:30 PM   Specimen: Nasal Mucosa; Nasal Swab  Result Value Ref Range Status   MRSA by PCR Next Gen NOT DETECTED NOT DETECTED Final    Comment: (NOTE) The GeneXpert MRSA Assay (FDA approved for NASAL specimens only), is one component of a comprehensive MRSA colonization surveillance program. It is not intended to diagnose MRSA infection nor to guide or monitor treatment for MRSA infections. Test performance is not FDA approved in patients less than 23 years old. Performed at [facility], [place] [address] [place], [place] [address]      Time coordinating discharge: 40 minutes  SIGNED:   Elmarie Shiley, MD  [facility]

## 2021-12-08 NOTE — Progress Notes (Signed)
General Surgery Follow Up Note  Subjective:    Overnight Issues: Denies. Feels well. Tolerating diet.  Objective:  Vital signs for last 24 hours: Temp:  [97.6 F (36.4 C)-98.6 F (37 C)] 98.6 F (37 C) (12/17 0450) Pulse Rate:  [60-72] 64 (12/17 0450) Resp:  [12-19] 19 (12/17 0450) BP: (133-156)/(73-88) 139/81 (12/17 0450) SpO2:  [96 %-100 %] 96 % (12/17 0450) Weight:  [90.4 kg] 90.4 kg (12/17 0500)  Hemodynamic parameters for last 24 hours:    Intake/Output from previous day: 12/16 0701 - 12/17 0700 In: 340 [P.O.:240; IV Piggyback:100] Out: -   Intake/Output this shift: No intake/output data recorded.  Vent settings for last 24 hours:    Physical Exam:  Gen: comfortable, no distress Neuro: non-focal exam HEENT: PERRL Neck: supple CV: RRR Pulm: unlabored breathing Abd: soft, NT/ND GU: clear yellow urine Extr: wwp, no edema   Results for orders placed or performed during the hospital encounter of 12/06/21 (from the past 24 hour(s))  Glucose, capillary     Status: None   Collection Time: 12/07/21 10:06 AM  Result Value Ref Range   Glucose-Capillary 81 70 - 99 mg/dL  Glucose, capillary     Status: Abnormal   Collection Time: 12/07/21 11:15 AM  Result Value Ref Range   Glucose-Capillary 69 (L) 70 - 99 mg/dL  Glucose, capillary     Status: None   Collection Time: 12/07/21 11:30 AM  Result Value Ref Range   Glucose-Capillary 83 70 - 99 mg/dL  MRSA Next Gen by PCR, Nasal     Status: None   Collection Time: 12/07/21  1:30 PM   Specimen: Nasal Mucosa; Nasal Swab  Result Value Ref Range   MRSA by PCR Next Gen NOT DETECTED NOT DETECTED  Glucose, capillary     Status: Abnormal   Collection Time: 12/07/21  4:17 PM  Result Value Ref Range   Glucose-Capillary 278 (H) 70 - 99 mg/dL  Glucose, capillary     Status: Abnormal   Collection Time: 12/07/21 10:17 PM  Result Value Ref Range   Glucose-Capillary 239 (H) 70 - 99 mg/dL  Basic metabolic panel     Status:  Abnormal   Collection Time: 12/08/21  1:59 AM  Result Value Ref Range   Sodium 132 (L) 135 - 145 mmol/L   Potassium 5.0 3.5 - 5.1 mmol/L   Chloride 93 (L) 98 - 111 mmol/L   CO2 31 22 - 32 mmol/L   Glucose, Bld 192 (H) 70 - 99 mg/dL   BUN 50 (H) 6 - 20 mg/dL   Creatinine, Ser 6.82 (H) 0.61 - 1.24 mg/dL   Calcium 7.9 (L) 8.9 - 10.3 mg/dL   GFR, Estimated 9 (L) >60 mL/min   Anion gap 8 5 - 15  CBC     Status: Abnormal   Collection Time: 12/08/21  1:59 AM  Result Value Ref Range   WBC 8.3 4.0 - 10.5 K/uL   RBC 3.31 (L) 4.22 - 5.81 MIL/uL   Hemoglobin 10.7 (L) 13.0 - 17.0 g/dL   HCT 32.6 (L) 39.0 - 52.0 %   MCV 98.5 80.0 - 100.0 fL   MCH 32.3 26.0 - 34.0 pg   MCHC 32.8 30.0 - 36.0 g/dL   RDW 14.0 11.5 - 15.5 %   Platelets 158 150 - 400 K/uL   nRBC 0.4 (H) 0.0 - 0.2 %    Assessment & Plan:  Present on Admission:  Acute appendicitis  Chronic combined systolic and diastolic  CHF (congestive heart failure) ([place])    LOS: 1 day   Additional comments:I reviewed the patient's new clinical lab test results.   and I reviewed the patients new imaging test results.    Doing well, no complaints. No significant pain. Tolerating diet. Going home today. Follow-up information noted in AVS  Dispo - Home today    12/08/2021  *Care during the described time interval was provided by me. I have reviewed this patient's available data, including medical history, events of note, physical examination and test results as part of my evaluation.

## 2021-12-09 NOTE — Progress Notes (Signed)
Discharge instructions reviewed and given to pt/daughter, both acknowledge understanding

## 2021-12-10 LAB — SURGICAL PATHOLOGY

## 2021-12-10 NOTE — TOC Transition Note (Signed)
Transition of care contact from inpatient facility  Date of discharge: 12/08/21 Date of contact: 12/10/21 Method: Phone Spoke to: Patient  Patient contacted to discuss transition of care from recent inpatient hospitalization. Patient was admitted to [facility] from 12/06/21-12/08/21 with discharge diagnosis of acute appendicitis.  Medication changes were reviewed. Patient denied any medication refills.  Patient will follow up with his outpatient HD unit on: [facility] on 12/11/21.  Tobie Poet, NP

## 2022-12-18 ENCOUNTER — Telehealth: Payer: Self-pay

## 2022-12-18 ENCOUNTER — Other Ambulatory Visit: Payer: Self-pay | Admitting: Endocrinology

## 2022-12-18 NOTE — Telephone Encounter (Signed)
Patient requesting refill on tresiba. Has not been seen since April 2021. Has upcoming appt in March. Please advise

## 2022-12-19 ENCOUNTER — Other Ambulatory Visit: Payer: Self-pay | Admitting: Endocrinology

## 2022-12-19 NOTE — Telephone Encounter (Signed)
Sent to pharmacy 

## 2023-02-03 ENCOUNTER — Telehealth: Payer: Self-pay

## 2023-02-03 NOTE — Telephone Encounter (Signed)
Discussed Jardiance and indicated that this should be discussed with the nephrologist as unclear whether it will work with his end-stage renal failure

## 2023-02-03 NOTE — Telephone Encounter (Signed)
Pt stopped by the office to request clearance be provided to his cardiologist Dr Hessie Knows with [facility] to start [place]. Cardiologist is requesting a call at [phone].

## 2023-03-03 ENCOUNTER — Other Ambulatory Visit (INDEPENDENT_AMBULATORY_CARE_PROVIDER_SITE_OTHER): Payer: Medicare Other

## 2023-03-03 ENCOUNTER — Other Ambulatory Visit: Payer: Self-pay | Admitting: Endocrinology

## 2023-03-03 DIAGNOSIS — E1165 Type 2 diabetes mellitus with hyperglycemia: Secondary | ICD-10-CM

## 2023-03-03 DIAGNOSIS — Z794 Long term (current) use of insulin: Secondary | ICD-10-CM | POA: Diagnosis not present

## 2023-03-03 LAB — HEMOGLOBIN A1C: Hgb A1c MFr Bld: 7.6 % — ABNORMAL HIGH (ref 4.6–6.5)

## 2023-03-03 LAB — GLUCOSE, RANDOM: Glucose, Bld: 181 mg/dL — ABNORMAL HIGH (ref 70–99)

## 2023-03-04 LAB — FRUCTOSAMINE: Fructosamine: 559 umol/L — ABNORMAL HIGH (ref 0–285)

## 2023-03-05 ENCOUNTER — Ambulatory Visit (INDEPENDENT_AMBULATORY_CARE_PROVIDER_SITE_OTHER): Payer: Medicare Other | Admitting: Endocrinology

## 2023-03-05 ENCOUNTER — Encounter: Payer: Self-pay | Admitting: Endocrinology

## 2023-03-05 ENCOUNTER — Other Ambulatory Visit: Payer: Self-pay

## 2023-03-05 VITALS — BP 154/80 | HR 65 | Ht 74.0 in | Wt 195.6 lb

## 2023-03-05 DIAGNOSIS — E1165 Type 2 diabetes mellitus with hyperglycemia: Secondary | ICD-10-CM

## 2023-03-05 DIAGNOSIS — E7800 Pure hypercholesterolemia, unspecified: Secondary | ICD-10-CM

## 2023-03-05 DIAGNOSIS — Z794 Long term (current) use of insulin: Secondary | ICD-10-CM | POA: Diagnosis not present

## 2023-03-05 MED ORDER — TRESIBA FLEXTOUCH 100 UNIT/ML ~~LOC~~ SOPN: 35.0000 [IU] | PEN_INJECTOR | Freq: Every day | SUBCUTANEOUS | 0 refills | Status: AC

## 2023-03-05 MED ORDER — NOVOLOG FLEXPEN 100 UNIT/ML ~~LOC~~ SOPN: PEN_INJECTOR | SUBCUTANEOUS | 1 refills | Status: DC

## 2023-03-05 MED ORDER — NOVOLOG FLEXPEN 100 UNIT/ML ~~LOC~~ SOPN: PEN_INJECTOR | SUBCUTANEOUS | 1 refills | Status: AC

## 2023-03-05 NOTE — Progress Notes (Unsigned)
Patient ID: [name] [name], male   DOB: 08/07/1966, 57 y.o.   MRN: [id number]            Reason for Appointment: Follow-up for Type 2 Diabetes   History of Present Illness:          Date of diagnosis of type 2 diabetes mellitus: 2001       Background history:   He was started on insulin 2 or 3 years after his initial diagnosis He was on various insulin regimens, mostly Lantus and NovoLog and at some point was also on NPH and regular His blood sugar control has been typically poor with A1c as high as 15.7  Recent history:   INSULIN regimen is:  38 Tresiba at bedtime, NovoLog: 3  He works night shifts, 12 hours after 7 PM  Non-insulin hypoglycemic drugs the patient is taking are: None  Current management, blood sugar patterns and problems identified:  His A1c is  to 6.7 compared to 8.6, previously as low as 6.3  He was using the freestyle libre version 2 This appears to be reading significantly lower than the actual reading but he has not been aware of this and has not checked his fingersticks He is asymptomatic even when his blood sugars are reading as low as 40 in the morning Today in the office his blood sugar was 215 and on his home sensor it was about 157 He does not always take his NovoLog including today when his blood sugars are low normal or low This is despite his eating significant amount of carbohydrate like bagels or oatmeal with juice in the morning Also appears to be taking less NovoLog than he used to take previously He has started exercising at the gym  Mels 12-2 am, 6 pm, 8 am       No recent weight change   PRE-MEAL Fasting Lunch Dinner Bedtime Overall  Glucose range: 140-160      Mean/median:        POST-MEAL PC Breakfast PC Lunch PC Dinner  Glucose range:  >200 ?  Mean/median:       Glucose monitoring:  With freestyle libre  CGM use % of time  49  2-week average/SD  104  Time in range      67%  % Time Above 180  6  % Time above 250    % Time Below 70  27     PRE-MEAL Fasting Lunch Dinner Bedtime Overall  Glucose range:       Averages:  76  80  102     POST-MEAL PC Breakfast PC Lunch PC Dinner  Glucose range:     Averages:  85 ?  131      Self-care: The diet that the patient has been following is: tries to limit high-fat foods.     Typical meal intake: Breakfast is generally cream of wheat, egg toast at 8am, dinner 5 pm                Dietician visit, 11/2017               Exercise:     Weight history:  Wt Readings from Last 3 Encounters:  03/05/23 195 lb 9.6 oz (88.7 kg)  12/08/21 199 lb 4.7 oz (90.4 kg)  07/29/21 198 lb (89.8 kg)    Glycemic control:   Lab Results  Component Value Date   HGBA1C 7.6 (H) 03/03/2023   HGBA1C 7.9 (H) 12/07/2021   HGBA1C  7.7 (H) 06/23/2021   Lab Results  Component Value Date   MICROALBUR 63.31 (H) 03/13/2012   LDLCALC 97 04/10/2020   CREATININE 6.82 (H) 12/08/2021   Lab Results  Component Value Date   MICRALBCREAT 614.7 (H) 03/13/2012    Lab Results  Component Value Date   FRUCTOSAMINE 559 (H) 03/03/2023   FRUCTOSAMINE 446 (H) 04/10/2020   FRUCTOSAMINE 598 (H) 12/08/2019      Allergies as of 03/05/2023       Reactions   Penicillins Rash, Other (See Comments)   Tolerating Ceftriaxone (03/12/12) Has patient had a PCN reaction causing immediate rash, facial/tongue/throat swelling, SOB or lightheadedness with hypotension: Yes Has patient had a PCN reaction causing severe rash involving mucus membranes or skin necrosis: Unk Has patient had a PCN reaction that required hospitalization: No Has patient had a PCN reaction occurring within the last 10 years: No If all of the above answers are "NO", then may proceed with Cephalosporin use.        Medication List        Accurate as of March 05, 2023 10:44 AM. If you have any questions, ask your nurse or doctor.          aspirin 81 MG tablet Take 81 mg by mouth at bedtime.   b complex-vitamin  c-folic acid 0.8 MG Tabs tablet Take 1 tablet by mouth daily.   calcium elemental as carbonate 400 MG chewable tablet Commonly known as: BARIATRIC TUMS ULTRA Chew 2,000 mg by mouth 3 (three) times daily.   carvedilol 25 MG tablet Commonly known as: COREG Take 1.5 tablets (37.5 mg total) by mouth 2 (two) times daily with a meal.   Entresto 49-51 MG Generic drug: sacubitril-valsartan Take 1 tablet by mouth 2 (two) times daily.   FreeStyle Libre 14 Day Reader Kerrin Mo 1 each by Does not apply route See admin instructions. Use to monitor blood sugars.   FreeStyle Libre 2 Sensor Misc USE AS DIRECTED What changed:  how much to take how to take this when to take this   glucose blood test strip Use Onetouch verio test strips as instructed to check blood sugar twice daily. What changed:  how to take this when to take this   ibuprofen 600 MG tablet Commonly known as: ADVIL Take 1 tablet (600 mg total) by mouth every 6 (six) hours as needed.   ivabradine 5 MG Tabs tablet Commonly known as: CORLANOR Take 5 mg by mouth 2 (two) times daily with a meal.   lanthanum 1000 MG chewable tablet Commonly known as: FOSRENOL Chew 1 tablet (1,000 mg total) by mouth 3 (three) times daily with meals. What changed: how much to take   menthol-cetylpyridinium 3 MG lozenge Commonly known as: CEPACOL Take 1 lozenge (3 mg total) by mouth as needed for sore throat.   methocarbamol 750 MG tablet Commonly known as: Robaxin-750 Take 1 tablet (750 mg total) by mouth 4 (four) times daily.   NovoLOG FlexPen 100 UNIT/ML FlexPen Generic drug: insulin aspart 5 to 7 units for breakfast and lunch and 10 to 14 units at dinner based on meal size and carbohydrateS   CBG 70 - 120: 0 units  CBG 121 - 150: 0 units  CBG 151 - 200: 1 unit  CBG 201-250: 1 units  CBG 251-300: 2 units  CBG 301-350: 3 units  CBG 351-400: 4 units   OneTouch Delica Lancets 99991111 Misc Use onetouch delica lancets to check blood sugar  twice daily. What changed:  how much to take how to take this when to take this additional instructions   OneTouch Verio Flex System w/Device Kit Use to check blood sugar twice daily. What changed:  how much to take how to take this when to take this additional instructions   oxyCODONE 5 MG immediate release tablet Commonly known as: Roxicodone Take 1 tablet (5 mg total) by mouth every 4 (four) hours as needed for severe pain.   Tyler Aas FlexTouch 100 UNIT/ML FlexTouch Pen Generic drug: insulin degludec INJECT 30 UNITS INTO THE SKIN DAILY What changed: additional instructions   vitamin C 1000 MG tablet Take 1,000 mg by mouth daily.        Allergies:  Allergies  Allergen Reactions   Penicillins Rash and Other (See Comments)    Tolerating Ceftriaxone (03/12/12) Has patient had a PCN reaction causing immediate rash, facial/tongue/throat swelling, SOB or lightheadedness with hypotension: Yes Has patient had a PCN reaction causing severe rash involving mucus membranes or skin necrosis: Unk Has patient had a PCN reaction that required hospitalization: No Has patient had a PCN reaction occurring within the last 10 years: No If all of the above answers are "NO", then may proceed with Cephalosporin use.     Past Medical History:  Diagnosis Date   Bacteremia    Diabetes mellitus    Dialysis patient [facility])    Hypertension    Pneumonia 02/2012   Strep pneumoniae bilateral pneumonia complicated by bacteremia   Renal disorder    End stage, not on dialysis    Past Surgical History:  Procedure Laterality Date   AMPUTATION TOE     AV FISTULA PLACEMENT Left 05/22/2018   Procedure: Creation of BrachiaCephalic Fistula Left Arm;  Surgeon: Angelia Mould, MD;  Location: [place];  Service: Vascular;  Laterality: Left;   [place] Left 11/12/2018   Procedure: BASILIC VEIN TRANSPOSITION LEFT ARM;  Surgeon: Serafina Mitchell, MD;  Location: [place];  Service:  Vascular;  Laterality: Left;   CHEST TUBE INSERTION     placed during hospitalization 02/2012   CHOLECYSTECTOMY N/A 06/25/2021   Procedure: LAPAROSCOPIC CHOLECYSTECTOMY;  Surgeon: Stark Klein, MD;  Location: [place];  Service: General;  Laterality: N/A;   COLONOSCOPY  04/01/2012   Procedure: COLONOSCOPY;  Surgeon: Juanita Craver, MD;  Location: [facility];  Service: Endoscopy;  Laterality: N/A;   ERCP N/A 06/24/2021   Procedure: ENDOSCOPIC RETROGRADE CHOLANGIOPANCREATOGRAPHY (ERCP);  Surgeon: Clarene Essex, MD;  Location: [place];  Service: Endoscopy;  Laterality: N/A;   ESOPHAGOGASTRODUODENOSCOPY  03/31/2012   Procedure: ESOPHAGOGASTRODUODENOSCOPY (EGD);  Surgeon: Beryle Beams, MD;  Location: [facility] ENDOSCOPY;  Service: Endoscopy;  Laterality: N/A;   EXCHANGE OF A DIALYSIS CATHETER Right 05/22/2018   Procedure: EXCHANGE OF A DIALYSIS CATHETER;  Surgeon: Angelia Mould, MD;  Location: [place];  Service: Vascular;  Laterality: Right;   IR FLUORO GUIDE CV LINE RIGHT  05/20/2018   IR THROMBECTOMY AV FISTULA W/THROMBOLYSIS INC/SHUNT/IMG LEFT Left 09/10/2018   IR US GUIDE VASC ACCESS LEFT  09/10/2018   IR US GUIDE VASC ACCESS RIGHT  05/20/2018   LAPAROSCOPIC APPENDECTOMY N/A 12/07/2021   Procedure: APPENDECTOMY LAPAROSCOPIC;  Surgeon: Jesusita Oka, MD;  Location: [place];  Service: General;  Laterality: N/A;   PANCREATIC STENT PLACEMENT  06/24/2021   Procedure: PANCREATIC STENT PLACEMENT;  Surgeon: Clarene Essex, MD;  Location: [place];  Service: Endoscopy;;   REMOVAL OF STONES  06/24/2021   Procedure: REMOVAL OF STONES;  Surgeon: Clarene Essex,  MD;  Location: [place];  Service: Endoscopy;;   SPHINCTEROTOMY  06/24/2021   Procedure: Joan Mayans;  Surgeon: Clarene Essex, MD;  Location: [place];  Service: Endoscopy;;   STENT REMOVAL  06/24/2021   Procedure: STENT REMOVAL;  Surgeon: Clarene Essex, MD;  Location: [facility] ENDOSCOPY;  Service: Endoscopy;;    Family History  Problem Relation Age of Onset    Diabetes Maternal Grandmother     Social History:  reports that he has never smoked. He has never used smokeless tobacco. He reports current alcohol use. He reports that he does not use drugs.   Review of Systems  Lipid history: No treatment currently for hyperlipidemia, previously had taken atorvastatin No history of CAD    Lab Results  Component Value Date   CHOL 159 04/10/2020   HDL 40.90 04/10/2020   LDLCALC 97 04/10/2020   TRIG 104.0 04/10/2020   CHOLHDL 4 04/10/2020           Hypertension:Treated with carvedilol and amlodipine followed by nephrologist Has history of CHF, taking Entresto  BP Readings from Last 3 Encounters:  03/05/23 (!) 154/80  12/08/21 139/81  07/29/21 (!) 178/90     Most recent eye exam was reportedly in 2020 but no records available, has had an intervention done by a retina surgeon  Has had amputation of the left toe  Complications of diabetes: Neuropathy, Nephropathy, proliferative retinopathy  LABS:  Lab on 03/03/2023  Component Date Value Ref Range Status   Glucose, Bld 03/03/2023 181 (H)  70 - 99 mg/dL Final   Fructosamine 03/03/2023 559 (H)  0 - 285 umol/L Final   Comment: Published reference interval for apparently healthy subjects between age 70 and 25 is 33 - 285 umol/L and in a poorly controlled diabetic population is 228 - 563 umol/L with a mean of 396 umol/L.    Hgb A1c MFr Bld 03/03/2023 7.6 (H)  4.6 - 6.5 % Final   Glycemic Control Guidelines for People with Diabetes:Non Diabetic:  <6%Goal of Therapy: <7%Additional Action Suggested:  >8%     Physical Examination:  BP (!) 154/80 (BP Location: Left Arm, Patient Position: Sitting, Cuff Size: Normal)   Pulse 65   Ht '6\' 2"'$  (1.88 m)   Wt 195 lb 9.6 oz (88.7 kg)   SpO2 95%   BMI 25.11 kg/m   No ankle edema present      ASSESSMENT:  Diabetes type 2, uncontrolled, insulin-dependent for several years  See history of present illness for detailed discussion of current  diabetes management, blood sugar patterns and problems identified  A1c is 6.7, previously 8.6 However fructosamine is 446  He is coming back after about 5 months  He is back on his insulin with basal bolus regimen However even though his A1c looks excellent he is getting falsely low readings with his freestyle libre Blood sugar is in the office about 50 mg lower than the actual reading Also although his overnight blood sugars appear to be the lowest he has not had any symptoms of hypoglycemia  Discussed A1c and fructosamine levels and his actual blood sugars as well as blood sugar targets  LIPIDS: Better controlled with starting atorvastatin 10 mg and he will continue the same dose  HYPERTENSION: It is controlled, followed by cardiologist and nephrologist  PLAN:   Restart checking fingersticks with a FreeStyle meter using his sensor reader He will call the company to see why he has difficulties with accuracy In the meantime he can reduce the alarms for  low readings to be below 60 instead of 70 He will adjust his Tyler Aas once he has more reliable blood sugar readings and keep morning sugars between 80-120 Discussed that he needs to take NovoLog with every meal regardless of blood sugar reading since he is eating carbohydrates at most meals including breakfast Most likely he will need to adjust his suppertime dose based on the readings 2 hours later Likely needs to reduce carbohydrate in the morning if postprandial readings are continuing to be high Consider using Dexcom or guardian sensor if freestyle libre still continues to be inaccurate and once he is able to afford it, discussed the differences  There are no Patient Instructions on file for this visit.     Elayne Snare 03/05/2023, 10:44 AM   Note: This office note was prepared with Dragon voice recognition system technology. Any transcriptional errors that result from this process are unintentional.

## 2023-03-05 NOTE — Patient Instructions (Addendum)
Take 4-5 units of insulin to cover her lunch Also have mid night snack around 4 to 5 AM with protein to keep the sugar from getting low  Check the blood sugars to see if they are controlled after breakfast and dinner  Blood sugar targets under 140 before eating and under 180 after eating  Reduce Tresiba to 35 units

## 2023-03-06 MED ORDER — DEXCOM G7 SENSOR MISC: 1.0000 | 3 refills | Status: AC

## 2023-03-18 ENCOUNTER — Other Ambulatory Visit (HOSPITAL_COMMUNITY): Payer: Self-pay

## 2023-03-18 ENCOUNTER — Encounter (HOSPITAL_COMMUNITY): Payer: Self-pay

## 2023-03-18 ENCOUNTER — Telehealth: Payer: Self-pay

## 2023-03-18 NOTE — Telephone Encounter (Signed)
PA request received via fax for Dexcom G7 Sensor  PA not able to be submitted due to patient not checking sugars regularly or seeing provider regularly. Patient not adjusting insulin per meals.   Key: [id number]

## 2023-03-18 NOTE — Telephone Encounter (Signed)
Patient advised and verbalized understanding 

## 2023-07-07 ENCOUNTER — Other Ambulatory Visit: Payer: 59

## 2023-07-09 ENCOUNTER — Ambulatory Visit: Payer: 59 | Admitting: Endocrinology

## 2023-11-25 ENCOUNTER — Encounter (HOSPITAL_BASED_OUTPATIENT_CLINIC_OR_DEPARTMENT_OTHER): Payer: Self-pay | Admitting: Emergency Medicine

## 2023-11-25 ENCOUNTER — Other Ambulatory Visit: Payer: Self-pay

## 2023-11-25 ENCOUNTER — Emergency Department (HOSPITAL_BASED_OUTPATIENT_CLINIC_OR_DEPARTMENT_OTHER)
Admission: EM | Admit: 2023-11-25 | Discharge: 2023-11-25 | Disposition: A | Discharge status: Home / Self Care | Payer: 59 | Attending: Emergency Medicine | Admitting: Emergency Medicine

## 2023-11-25 DIAGNOSIS — Z7982 Long term (current) use of aspirin: Secondary | ICD-10-CM | POA: Diagnosis not present

## 2023-11-25 DIAGNOSIS — E875 Hyperkalemia: Secondary | ICD-10-CM | POA: Diagnosis present

## 2023-11-25 LAB — BASIC METABOLIC PANEL
Anion gap: 7 (ref 5–15)
BUN: 64 mg/dL — ABNORMAL HIGH (ref 6–20)
CO2: 22 mmol/L (ref 22–32)
Calcium: 8.9 mg/dL (ref 8.9–10.3)
Chloride: 109 mmol/L (ref 98–111)
Creatinine, Ser: 2.2 mg/dL — ABNORMAL HIGH (ref 0.61–1.24)
GFR, Estimated: 34 mL/min — ABNORMAL LOW (ref >60)
Glucose, Bld: 223 mg/dL — ABNORMAL HIGH (ref 70–99)
Potassium: 5.8 mmol/L — ABNORMAL HIGH (ref 3.5–5.1)
Sodium: 138 mmol/L (ref 135–145)

## 2023-11-25 NOTE — ED Triage Notes (Signed)
Pt reports recent lab draw today result of k+ of 6.8. Instructed to go to ED for lab check. Pt kidney transplant in May.

## 2023-11-25 NOTE — ED Provider Notes (Signed)
[place] EMERGENCY DEPARTMENT AT [facility] Provider Note   CSN: [id number] Arrival date & time: 11/25/23  1649     History  Chief Complaint  Patient presents with   Abnormal Lab    [name] [name] is a 57 y.o. male.   Abnormal Lab Time since result:  Elevated potassum Patient referred by:  Specialist Resulting agency:  External Resulting agency details:  6.7? Result type: chemistry   Chemistry:    Potassium:  High      Home Medications Prior to Admission medications   Medication Sig Start Date End Date Taking? Authorizing Provider  Ascorbic Acid (VITAMIN C) 1000 MG tablet Take 1,000 mg by mouth daily.    [provider]  aspirin 81 MG tablet Take 81 mg by mouth at bedtime.    [provider]  b complex-vitamin c-folic acid (NEPHRO-VITE) 0.8 MG TABS tablet Take 1 tablet by mouth daily.    [provider]  Blood Glucose Monitoring Suppl (ONETOUCH VERIO FLEX SYSTEM) w/Device KIT Use to check blood sugar twice daily. Patient taking differently: 1 each by Other route 2 (two) times daily. 04/21/20   Reather Littler, MD  calcium elemental as carbonate (BARIATRIC TUMS ULTRA) 400 MG chewable tablet Chew 2,000 mg by mouth 3 (three) times daily.    [provider]  carvedilol (COREG) 25 MG tablet Take 1.5 tablets (37.5 mg total) by mouth 2 (two) times daily with a meal. 03/25/17   Lennette Bihari, MD  Continuous Blood Gluc Receiver (FREESTYLE LIBRE 14 DAY READER) DEVI 1 each by Does not apply route See admin instructions. Use to monitor blood sugars. 03/13/20   Reather Littler, MD  Continuous Blood Gluc Sensor (DEXCOM G7 SENSOR) MISC 1 Device by Does not apply route as directed. Change sensor every 10 days 03/06/23   Reather Littler, MD  glucose blood test strip Use Onetouch verio test strips as instructed to check blood sugar twice daily. Patient taking differently: by Other route See admin instructions. Use Onetouch verio test strips as  instructed to check blood sugar twice daily. 04/21/20   Reather Littler, MD  ibuprofen (ADVIL) 600 MG tablet Take 1 tablet (600 mg total) by mouth every 6 (six) hours as needed. 12/07/21   Diamantina Monks, MD  insulin aspart (NOVOLOG FLEXPEN) 100 UNIT/ML FlexPen 5 to 7 units for breakfast and lunch and 10 to 14 units at dinner based on meal size and carbohydrateS CBG 70 - 120: 0 units CBG 121 - 150: 0 units CBG 151 - 200: 1 unit CBG 201-250: 1 units CBG 251-300: 2 units CBG 301-350: 3 units CBG 351-400: 4 units 03/05/23   Reather Littler, MD  insulin degludec (TRESIBA FLEXTOUCH) 100 UNIT/ML FlexTouch Pen Inject 35 Units into the skin daily. 03/05/23   Reather Littler, MD  ivabradine (CORLANOR) 5 MG TABS tablet Take 5 mg by mouth 2 (two) times daily with a meal.    [provider]  lanthanum (FOSRENOL) 1000 MG chewable tablet Chew 1 tablet (1,000 mg total) by mouth 3 (three) times daily with meals. Patient taking differently: Chew 2,000 mg by mouth 3 (three) times daily with meals. 05/25/18   Zannie Cove, MD  menthol-cetylpyridinium (CEPACOL) 3 MG lozenge Take 1 lozenge (3 mg total) by mouth as needed for sore throat. 06/26/21   Rolly Salter, MD  methocarbamol (ROBAXIN-750) 750 MG tablet Take 1 tablet (750 mg total) by mouth 4 (four) times daily. 12/07/21   Diamantina Monks, MD  OneTouch Delica Lancets 30G MISC Use onetouch delica lancets to check blood sugar twice daily. Patient taking differently: 1 each by Other route 2 (two) times daily. 04/21/20   Reather Littler, MD  oxyCODONE (ROXICODONE) 5 MG immediate release tablet Take 1 tablet (5 mg total) by mouth every 4 (four) hours as needed for severe pain. 12/07/21   Diamantina Monks, MD  sacubitril-valsartan (ENTRESTO) 49-51 MG Take 1 tablet by mouth 2 (two) times daily.    [provider]      Allergies    Penicillins    Review of Systems   Review of Systems  Constitutional:  Negative for chills, fatigue and fever.  HENT:  Negative for  congestion.   Respiratory:  Negative for cough, chest tightness, shortness of breath and wheezing.   Cardiovascular:  Negative for chest pain and palpitations.  Gastrointestinal:  Negative for abdominal pain, constipation, diarrhea, nausea and vomiting.  Genitourinary:  Negative for dysuria and flank pain.  Musculoskeletal:  Negative for back pain and neck pain.  Skin:  Negative for rash and wound.  Neurological:  Negative for dizziness, weakness, light-headedness and headaches.  Psychiatric/Behavioral:  Negative for agitation and confusion.   All other systems reviewed and are negative.   Physical Exam Updated Vital Signs BP (!) 154/60   Pulse 71   Temp 98.1 F (36.7 C)   Resp 16   SpO2 99%  Physical Exam Vitals and nursing note reviewed.  Constitutional:      General: He is not in acute distress.    Appearance: He is well-developed. He is not ill-appearing, toxic-appearing or diaphoretic.  HENT:     Head: Normocephalic and atraumatic.     Nose: No congestion or rhinorrhea.  Eyes:     Extraocular Movements: Extraocular movements intact.     Conjunctiva/sclera: Conjunctivae normal.     Pupils: Pupils are equal, round, and reactive to light.  Cardiovascular:     Rate and Rhythm: Normal rate and regular rhythm.     Pulses: Normal pulses.     Heart sounds: No murmur heard. Pulmonary:     Effort: Pulmonary effort is normal. No respiratory distress.     Breath sounds: Normal breath sounds. No wheezing, rhonchi or rales.  Chest:     Chest wall: No tenderness.  Abdominal:     General: Abdomen is flat.     Palpations: Abdomen is soft.     Tenderness: There is no abdominal tenderness. There is no right CVA tenderness, left CVA tenderness, guarding or rebound.  Musculoskeletal:        General: No swelling or tenderness.     Cervical back: Neck supple.     Right lower leg: No edema.     Left lower leg: No edema.  Skin:    General: Skin is warm and dry.     Capillary Refill:  Capillary refill takes less than 2 seconds.     Findings: No erythema or rash.  Neurological:     General: No focal deficit present.     Mental Status: He is alert.  Psychiatric:        Mood and Affect: Mood normal.     ED Results / Procedures / Treatments   Labs (all labs ordered are listed, but only abnormal results are displayed) Labs Reviewed  BASIC METABOLIC PANEL - Abnormal; Notable for the following components:      Result Value   Potassium 5.8 (*)    Glucose, Bld 223 (*)  BUN 64 (*)    Creatinine, Ser 2.20 (*)    GFR, Estimated 34 (*)    All other components within normal limits    EKG EKG Interpretation Date/Time:  Tuesday November 25 2023 17:44:25 EST Ventricular Rate:  69 PR Interval:  152 QRS Duration:  138 QT Interval:  470 QTC Calculation: 504 R Axis:   -78  Text Interpretation: Sinus rhythm Nonspecific IVCD with LAD Left ventricular hypertrophy PAced rhythm when compared to prior No STEMI Confirmed by Theda Belfast ([id number]) on 11/25/2023 5:45:45 PM  Radiology No results found.  Procedures Procedures    Medications Ordered in ED Medications - No data to display   ED Course/ Medical Decision Making/ A&P                                 Medical Decision Making Amount and/or Complexity of Data Reviewed Labs: ordered.    [name] [name] is a 57 y.o. male with a past medical history significant for previous A-fib/CHF with pacemaker/ICD, previous renal transplant managed by [facility], and diabetes who presents for suspected hyperkalemia.  According to patient, about a week ago he had blood work that showed potassium in the fives.  He saw his [facility] team today and had his infusion treatment for antirejection medication.  He reports they took blood right before infusion and although he has no symptoms whatsoever, was called this evening stating his potassium was 6.7 and he need to go to the emergency department for evaluation.  He reports that he  otherwise has no symptoms and feels fine.  He says that he has not been taking his Veltassa as well as he needs to due to not knowing how best to consume it.  This normally keeps his potassium down.  He says that otherwise he has no fevers, chills, chest pain, shortness rectum palpitations, nausea, vomiting, constipation, diarrhea, or urinary changes.  On exam, lungs clear.  Chest nontender.  Abdomen nontender.  Patient resting comfortably without acute distress.  We will get repeat metabolic panel to see if his potassium truly does significantly elevated.  If it is, anticipate discussion with his transplant team to discuss how best to proceed.  He reports that he does not want to admitted to any [facility] facility and would rather go to [facility] if he has to be admitted but he is hopeful that he is able to go home and he can go see them tomorrow.  Will wait for metabolic panel to return and then make a plan.  6:11 PM Potassium returned at 5.8 thus it is lower than it was earlier today.  He thinks this is similar to what was several weeks ago.  EKG did not show STEMI or significant sharp T waves.  It is paced.  I had a shared decision-making conversation with patient including offering to call his [facility] transplant team to get guidance on managing the hyperkalemia however as it is similar to what it was before, he would rather take his Veltassa tonight, increase his hydration, and call his team tomorrow.  He does not want to be admitted and does not want [place] to wait on calling [facility] and he will call his team in the morning.  He understand strict return precautions and follow-up instructions and given his lack of any symptoms whatsoever and reassuring vitals now, we feel he is safe for discharge home.  Patient agrees and was discharged in good  condition.         Final Clinical Impression(s) / ED Diagnoses Final diagnoses:  Serum potassium elevated    Rx / DC Orders ED Discharge Orders     None        Clinical Impression: 1. Serum potassium elevated     Disposition: Discharge  Condition: Good  I have discussed the results, Dx and Tx plan with the pt(& family if present). He/she/they expressed understanding and agree(s) with the plan. Discharge instructions discussed at great length. Strict return precautions discussed and pt &/or family have verbalized understanding of the instructions. No further questions at time of discharge.    New Prescriptions   No medications on file    Follow Up: your transplant team        Fabien Travelstead, Canary Brim, MD 11/25/23 [phone]

## 2023-11-25 NOTE — Discharge Instructions (Signed)
Your history, exam, and evaluation today are consistent with slightly elevated potassium but it does not appear to be as critically elevated as it seemed earlier today.  As you reported, your potassium was in the fives several weeks ago and then today it was found to be in the high 6 range.  You were told to come in for evaluation and fortunately, it was found to be only 5.8 today.  We offered to contact your [facility] transplant team to get guidance but given your lack of symptoms, improving potassium, and plan to take your potassium lowering medication and improve hydration tonight, we feel it is reasonable to have you call them tomorrow to discuss ongoing management as well as observe strict return precautions to come back if anything was to change or worsen.  Your EKG did not show evidence of severe hyperkalemia either thus we feel you are safe for discharge home at this time.  Please rest and follow-up with your team.

## 2024-01-18 ENCOUNTER — Emergency Department (HOSPITAL_BASED_OUTPATIENT_CLINIC_OR_DEPARTMENT_OTHER): Payer: Medicare Other | Admitting: Radiology

## 2024-01-18 ENCOUNTER — Encounter (HOSPITAL_BASED_OUTPATIENT_CLINIC_OR_DEPARTMENT_OTHER): Payer: Self-pay | Admitting: Emergency Medicine

## 2024-01-18 ENCOUNTER — Other Ambulatory Visit: Payer: Self-pay

## 2024-01-18 ENCOUNTER — Emergency Department (HOSPITAL_BASED_OUTPATIENT_CLINIC_OR_DEPARTMENT_OTHER)
Admission: EM | Admit: 2024-01-18 | Discharge: 2024-01-18 | Disposition: A | Discharge status: Other Acute Inpatient Hospital | Payer: Medicare Other | Attending: Emergency Medicine | Admitting: Emergency Medicine

## 2024-01-18 DIAGNOSIS — N186 End stage renal disease: Secondary | ICD-10-CM | POA: Diagnosis not present

## 2024-01-18 DIAGNOSIS — E162 Hypoglycemia, unspecified: Secondary | ICD-10-CM | POA: Diagnosis not present

## 2024-01-18 DIAGNOSIS — Z940 Kidney transplant status: Secondary | ICD-10-CM | POA: Insufficient documentation

## 2024-01-18 DIAGNOSIS — L03119 Cellulitis of unspecified part of limb: Secondary | ICD-10-CM

## 2024-01-18 DIAGNOSIS — M79671 Pain in right foot: Secondary | ICD-10-CM | POA: Diagnosis present

## 2024-01-18 DIAGNOSIS — Z7982 Long term (current) use of aspirin: Secondary | ICD-10-CM | POA: Diagnosis not present

## 2024-01-18 DIAGNOSIS — L03115 Cellulitis of right lower limb: Secondary | ICD-10-CM | POA: Insufficient documentation

## 2024-01-18 LAB — COMPREHENSIVE METABOLIC PANEL
ALT: 19 U/L (ref 0–44)
AST: 25 U/L (ref 15–41)
Albumin: 4.2 g/dL (ref 3.5–5.0)
Alkaline Phosphatase: 68 U/L (ref 38–126)
Anion gap: 9 (ref 5–15)
BUN: 43 mg/dL — ABNORMAL HIGH (ref 6–20)
CO2: 29 mmol/L (ref 22–32)
Calcium: 10.5 mg/dL — ABNORMAL HIGH (ref 8.9–10.3)
Chloride: 103 mmol/L (ref 98–111)
Creatinine, Ser: 2.44 mg/dL — ABNORMAL HIGH (ref 0.61–1.24)
GFR, Estimated: 30 mL/min — ABNORMAL LOW (ref >60)
Glucose, Bld: 58 mg/dL — ABNORMAL LOW (ref 70–99)
Potassium: 4.6 mmol/L (ref 3.5–5.1)
Sodium: 141 mmol/L (ref 135–145)
Total Bilirubin: 0.7 mg/dL (ref 0.0–1.2)
Total Protein: 7.1 g/dL (ref 6.5–8.1)

## 2024-01-18 LAB — CBC WITH DIFFERENTIAL/PLATELET
Abs Immature Granulocytes: 0.08 10*3/uL — ABNORMAL HIGH (ref 0.00–0.07)
Basophils Absolute: 0 10*3/uL (ref 0.0–0.1)
Basophils Relative: 0 %
Eosinophils Absolute: 0.2 10*3/uL (ref 0.0–0.5)
Eosinophils Relative: 2 %
HCT: 31.1 % — ABNORMAL LOW (ref 39.0–52.0)
Hemoglobin: 10.1 g/dL — ABNORMAL LOW (ref 13.0–17.0)
Immature Granulocytes: 1 %
Lymphocytes Relative: 8 %
Lymphs Abs: 0.6 10*3/uL — ABNORMAL LOW (ref 0.7–4.0)
MCH: 33.2 pg (ref 26.0–34.0)
MCHC: 32.5 g/dL (ref 30.0–36.0)
MCV: 102.3 fL — ABNORMAL HIGH (ref 80.0–100.0)
Monocytes Absolute: 0.4 10*3/uL (ref 0.1–1.0)
Monocytes Relative: 6 %
Neutro Abs: 6.2 10*3/uL (ref 1.7–7.7)
Neutrophils Relative %: 83 %
Platelets: 216 10*3/uL (ref 150–400)
RBC: 3.04 MIL/uL — ABNORMAL LOW (ref 4.22–5.81)
RDW: 14.6 % (ref 11.5–15.5)
WBC: 7.5 10*3/uL (ref 4.0–10.5)
nRBC: 0 % (ref 0.0–0.2)

## 2024-01-18 LAB — CBG MONITORING, ED: Glucose-Capillary: 44 mg/dL — CL (ref 70–99)

## 2024-01-18 LAB — LACTIC ACID, PLASMA: Lactic Acid, Venous: 1 mmol/L (ref 0.5–1.9)

## 2024-01-18 MED ORDER — VANCOMYCIN HCL IN DEXTROSE 1-5 GM/200ML-% IV SOLN
1000.0000 mg | Freq: Once | INTRAVENOUS | Status: AC
  Administered 2024-01-18: 1000 mg via INTRAVENOUS
  Filled 2024-01-18: qty 200

## 2024-01-18 MED ORDER — DOXYCYCLINE HYCLATE 100 MG PO CAPS
100.0000 mg | ORAL_CAPSULE | Freq: Two times a day (BID) | ORAL | 0 refills | Status: AC
Start: 2024-01-18 — End: ?

## 2024-01-18 MED ORDER — ONDANSETRON HCL 4 MG/2ML IJ SOLN
4.0000 mg | Freq: Once | INTRAMUSCULAR | Status: AC
  Administered 2024-01-18: 4 mg via INTRAVENOUS
  Filled 2024-01-18: qty 2

## 2024-01-18 MED ORDER — SODIUM CHLORIDE 0.9 % IV SOLN
2.0000 g | Freq: Once | INTRAVENOUS | Status: AC
  Administered 2024-01-18: 2 g via INTRAVENOUS
  Filled 2024-01-18: qty 12.5

## 2024-01-18 NOTE — ED Triage Notes (Signed)
Pt via pov from home with blister on his right foot since Friday. He states it is causing his leg to be painful and swell. Pt is 8 months post kidney transplant; called [facility] and was told to come to ED. Pt requested peanut butter crackers upon entering the triage room, stated his sugar was plummeting (has CGM). Pt given peanut butter crackers, and his CBG at triage was 44. Pt alert & oriented, nad noted.

## 2024-01-18 NOTE — ED Notes (Signed)
Report given to [facility] RN at [facility]... MD Silverio Lay gave permission for transfer POV.. IV secured... Pt understood he needed to drive straight to [facility] hospital... Pt Caox4.

## 2024-01-18 NOTE — ED Provider Notes (Addendum)
[place] EMERGENCY DEPARTMENT AT [facility] Provider Note   CSN: [id number] Arrival date & time: 01/18/24  1442     History  Chief Complaint  Patient presents with   Leg Swelling    [name] [name] is a 58 y.o. male history of ESRD status post kidney transplant at [facility], here presenting with right foot pain and swelling.  Patient states that he just finished a treatment for CMV at [facility].  He states that he noticed an ulcer on the base of his right fifth toe 2 days ago.  He states that it became painful and there is purulent discharge and a smell to it.  Patient called [facility] and was sent here for further evaluation.  Patient is not currently on any oral antibiotics.  Patient has no fever at home.  Patient was noted to be hypoglycemic in triage.  He states that he has not eaten all day.  The history is provided by the patient.       Home Medications Prior to Admission medications   Medication Sig Start Date End Date Taking? Authorizing Provider  Ascorbic Acid (VITAMIN C) 1000 MG tablet Take 1,000 mg by mouth daily.    [provider]  aspirin 81 MG tablet Take 81 mg by mouth at bedtime.    [provider]  b complex-vitamin c-folic acid (NEPHRO-VITE) 0.8 MG TABS tablet Take 1 tablet by mouth daily.    [provider]  Blood Glucose Monitoring Suppl (ONETOUCH VERIO FLEX SYSTEM) w/Device KIT Use to check blood sugar twice daily. Patient taking differently: 1 each by Other route 2 (two) times daily. 04/21/20   Reather Littler, MD  calcium elemental as carbonate (BARIATRIC TUMS ULTRA) 400 MG chewable tablet Chew 2,000 mg by mouth 3 (three) times daily.    [provider]  carvedilol (COREG) 25 MG tablet Take 1.5 tablets (37.5 mg total) by mouth 2 (two) times daily with a meal. 03/25/17   Lennette Bihari, MD  Continuous Blood Gluc Receiver (FREESTYLE LIBRE 14 DAY READER) DEVI 1 each by Does not apply route See admin instructions. Use to monitor  blood sugars. 03/13/20   Reather Littler, MD  Continuous Blood Gluc Sensor (DEXCOM G7 SENSOR) MISC 1 Device by Does not apply route as directed. Change sensor every 10 days 03/06/23   Reather Littler, MD  glucose blood test strip Use Onetouch verio test strips as instructed to check blood sugar twice daily. Patient taking differently: by Other route See admin instructions. Use Onetouch verio test strips as instructed to check blood sugar twice daily. 04/21/20   Reather Littler, MD  ibuprofen (ADVIL) 600 MG tablet Take 1 tablet (600 mg total) by mouth every 6 (six) hours as needed. 12/07/21   Diamantina Monks, MD  insulin aspart (NOVOLOG FLEXPEN) 100 UNIT/ML FlexPen 5 to 7 units for breakfast and lunch and 10 to 14 units at dinner based on meal size and carbohydrateS CBG 70 - 120: 0 units CBG 121 - 150: 0 units CBG 151 - 200: 1 unit CBG 201-250: 1 units CBG 251-300: 2 units CBG 301-350: 3 units CBG 351-400: 4 units 03/05/23   Reather Littler, MD  insulin degludec (TRESIBA FLEXTOUCH) 100 UNIT/ML FlexTouch Pen Inject 35 Units into the skin daily. 03/05/23   Reather Littler, MD  ivabradine (CORLANOR) 5 MG TABS tablet Take 5 mg by mouth 2 (two) times daily with a meal.    [provider]  lanthanum (FOSRENOL) 1000 MG chewable tablet Chew  1 tablet (1,000 mg total) by mouth 3 (three) times daily with meals. Patient taking differently: Chew 2,000 mg by mouth 3 (three) times daily with meals. 05/25/18   Zannie Cove, MD  menthol-cetylpyridinium (CEPACOL) 3 MG lozenge Take 1 lozenge (3 mg total) by mouth as needed for sore throat. 06/26/21   Rolly Salter, MD  methocarbamol (ROBAXIN-750) 750 MG tablet Take 1 tablet (750 mg total) by mouth 4 (four) times daily. 12/07/21   Diamantina Monks, MD  OneTouch Delica Lancets 30G MISC Use onetouch delica lancets to check blood sugar twice daily. Patient taking differently: 1 each by Other route 2 (two) times daily. 04/21/20   Reather Littler, MD  oxyCODONE (ROXICODONE) 5 MG immediate release  tablet Take 1 tablet (5 mg total) by mouth every 4 (four) hours as needed for severe pain. 12/07/21   Diamantina Monks, MD  sacubitril-valsartan (ENTRESTO) 49-51 MG Take 1 tablet by mouth 2 (two) times daily.    [provider]      Allergies    Penicillins    Review of Systems   Review of Systems  Skin:  Positive for wound.  All other systems reviewed and are negative.   Physical Exam Updated Vital Signs BP (!) 170/84 (BP Location: Right Arm)   Pulse 72   Temp 98.3 F (36.8 C)   Resp 16   Ht 6\' 2"  (1.88 m)   Wt 90.7 kg   SpO2 100%   BMI 25.68 kg/m  Physical Exam Vitals and nursing note reviewed.  Constitutional:      Comments: Chronically ill-appearing  HENT:     Head: Normocephalic.     Nose: Nose normal.     Mouth/Throat:     Mouth: Mucous membranes are moist.  Eyes:     Extraocular Movements: Extraocular movements intact.     Pupils: Pupils are equal, round, and reactive to light.  Cardiovascular:     Rate and Rhythm: Normal rate and regular rhythm.     Heart sounds: Normal heart sounds.  Pulmonary:     Effort: Pulmonary effort is normal.     Breath sounds: Normal breath sounds.  Abdominal:     General: Abdomen is flat.     Palpations: Abdomen is soft.  Musculoskeletal:     Cervical back: Normal range of motion and neck supple.     Comments: Patient has an ulcer underneath the right fifth metatarsal.  There is an area of fluctuance adjacent to the ulcer.  There is some purulent discharge as well  Skin:    General: Skin is warm.     Capillary Refill: Capillary refill takes less than 2 seconds.     Findings: Erythema present.  Neurological:     General: No focal deficit present.     Mental Status: He is oriented to person, place, and time.  Psychiatric:        Mood and Affect: Mood normal.        Behavior: Behavior normal.     ED Results / Procedures / Treatments   Labs (all labs ordered are listed, but only abnormal results are  displayed) Labs Reviewed  CBC WITH DIFFERENTIAL/PLATELET - Abnormal; Notable for the following components:      Result Value   RBC 3.04 (*)    Hemoglobin 10.1 (*)    HCT 31.1 (*)    MCV 102.3 (*)    Lymphs Abs 0.6 (*)    Abs Immature Granulocytes 0.08 (*)  All other components within normal limits  CBG MONITORING, ED - Abnormal; Notable for the following components:   Glucose-Capillary 44 (*)    All other components within normal limits  CULTURE, BLOOD (ROUTINE X 2)  CULTURE, BLOOD (ROUTINE X 2)  COMPREHENSIVE METABOLIC PANEL  LACTIC ACID, PLASMA  LACTIC ACID, PLASMA  CBG MONITORING, ED    EKG None  Radiology DG Foot Complete Right Result Date: 01/18/2024 CLINICAL DATA:  Ulcer on right foot at base of fifth toe (lateral) for 3 days. Foot swelling and pain. History of renal transplant 8 months ago. Diabetes. EXAM: RIGHT FOOT COMPLETE - 3+ VIEW COMPARISON:  Right foot radiographs 02/02/2013 FINDINGS: There is mild-to-moderate soft tissue swelling lateral to the fifth metatarsal head. On remote prior 02/02/2013 radiographs, there was similar soft tissue swelling and a reported wound. However, now there is new soft tissue air overlying the lateral aspect of the fifth metatarsal head. Mild hallux valgus. No definite cortical erosion is seen. Minimal chronic enthesopathic changes at the plantar fascia and Achilles tendon insertions on the calcaneus. Mild atherosclerotic calcifications. No acute fracture or dislocation. IMPRESSION: 1. Mild-to-moderate soft tissue swelling lateral to the fifth metatarsal head, similar to 02/02/2013 remote radiographs. However, now there is new soft tissue air overlying the lateral aspect of the fifth metatarsal head. This is concerning for soft tissue infection with gas-forming organism. 2. No definite cortical erosion is seen to indicate radiographic evidence of acute osteomyelitis, with attention to the fifth metatarsal head. Electronically Signed   By: Neita Garnet M.D.   On: 01/18/2024 16:39    Procedures Procedures    CRITICAL CARE Performed by: Richardean Canal   Total critical care time: 39 minutes  Critical care time was exclusive of separately billable procedures and treating other patients.  Critical care was necessary to treat or prevent imminent or life-threatening deterioration.  Critical care was time spent personally by me on the following activities: development of treatment plan with patient and/or surrogate as well as nursing, discussions with consultants, evaluation of patient's response to treatment, examination of patient, obtaining history from patient or surrogate, ordering and performing treatments and interventions, ordering and review of laboratory studies, ordering and review of radiographic studies, pulse oximetry and re-evaluation of patient's condition.   Medications Ordered in ED Medications  vancomycin (VANCOCIN) IVPB 1000 mg/200 mL premix (1,000 mg Intravenous New Bag/Given 01/18/24 1616)  ceFEPIme (MAXIPIME) 2 g in sodium chloride 0.9 % 100 mL IVPB (has no administration in time range)  ondansetron (ZOFRAN) injection 4 mg (4 mg Intravenous Given 01/18/24 1630)    ED Course/ Medical Decision Making/ A&P                                 Medical Decision Making RUSSELL QUINNEY is a 58 y.o. male here presenting with right fifth metatarsal ulcer with surrounding erythema.  Concern for possible osteomyelitis versus cellulitis versus abscess.  Plan to get CBC and CMP and lactate and cultures and right foot x-ray  4:57 PM Right foot x-ray showed soft tissue air on the lateral aspect of the fifth metatarsal head.  This is concerning for possible necrotizing infection.  Patient received Vanco and cefepime.  5:51 PM  WBC is 7.5, lactate nl. Cr 2.4. Glucose is 58.  He ate some food and glucose is up to 130 per his monitor.  I am concerned for the possible necrotizing infection.  I  felt that patient should be  admitted to the hospital.  He states that there is no way he would be admitted at this time.  He states that he was just admitted for CMV and he does not want to stay in the hospital under any circumstances.  He states that he would rather just take antibiotics.  He wants me to call [facility] to make sure the antibiotics are safe with his kidney.   6:32 PM I called the [facility] transplant center.  Discussed with Dr. Alda Ponder.  She also talked to the patient.  Patient is adamant that he does not want to stay in the hospital.  She wants to continue his Bactrim and is agreeable to adding doxycycline.  Patient states that he left a message with his podiatrist and will see tomorrow.  He states that if his podiatry recommend admission, he will go straight to [facility]  7:00 PM Patient now changed his mind and states that he is agreeable to be admitted.  I discussed with [facility] and he is able to get a bed at the transplant floor.  Daughter wants to transport the patient over so the nurse will wrap the IV.   Problems Addressed: Cellulitis of foot: acute illness or injury Hypoglycemia: acute illness or injury  Amount and/or Complexity of Data Reviewed Labs: ordered. Decision-making details documented in ED Course. Radiology: ordered and independent interpretation performed. Decision-making details documented in ED Course.  Risk Prescription drug management.    Final Clinical Impression(s) / ED Diagnoses Final diagnoses:  None    Rx / DC Orders ED Discharge Orders     None         Charlynne Pander, MD 01/18/24 1833    Charlynne Pander, MD 01/18/24 Mikle Bosworth

## 2024-01-18 NOTE — ED Notes (Addendum)
Pts glucometer, Dexcom 134 CBG... Provider aware... Provider also okay with using Pt device.

## 2024-01-18 NOTE — ED Notes (Signed)
Contacted [facility] for post transplant coordinator to speak with Chaney Malling, MD

## 2024-01-18 NOTE — Progress Notes (Signed)
ED Pharmacy Antibiotic Sign Off An antibiotic consult was received from an ED provider for cefepime per pharmacy dosing for sepsis. A chart review was completed to assess appropriateness.   The following one time order(s) were placed:  Cefepime 2g IV x 1  Further antibiotic and/or antibiotic pharmacy consults should be ordered by the admitting provider if indicated.   Thank you for allowing pharmacy to be a part of this patient's care.   Daylene Posey, [facility]  Clinical Pharmacist 01/18/24 4:25 PM

## 2024-01-18 NOTE — Discharge Instructions (Signed)
As we discussed, please see your podiatrist tomorrow.  We prefer you stay in the hospital for further evaluation but we respect your preference to see your podiatrist tomorrow  If your podiatrist recommend admission, please go to [facility] or return to the closest ER  I have prescribed doxycycline twice daily for a week after discussion with your kidney transplant doctor  Return to ER if you have severe pain, fever, purulent discharge from the foot

## 2024-01-18 NOTE — ED Notes (Signed)
Failed 2nd IV... Provider aware of not able to get the second blood culture.Marland KitchenMarland Kitchen

## 2024-01-23 LAB — CULTURE, BLOOD (ROUTINE X 2)
Culture: NO GROWTH
Special Requests: ADEQUATE

## 2024-04-09 ENCOUNTER — Other Ambulatory Visit: Payer: Self-pay

## 2024-04-09 ENCOUNTER — Encounter (HOSPITAL_BASED_OUTPATIENT_CLINIC_OR_DEPARTMENT_OTHER): Payer: Self-pay

## 2024-04-09 ENCOUNTER — Emergency Department (HOSPITAL_BASED_OUTPATIENT_CLINIC_OR_DEPARTMENT_OTHER)
Admission: EM | Admit: 2024-04-09 | Discharge: 2024-04-09 | Disposition: A | Discharge status: Home / Self Care | Attending: Emergency Medicine | Admitting: Emergency Medicine

## 2024-04-09 DIAGNOSIS — E1122 Type 2 diabetes mellitus with diabetic chronic kidney disease: Secondary | ICD-10-CM | POA: Diagnosis not present

## 2024-04-09 DIAGNOSIS — N186 End stage renal disease: Secondary | ICD-10-CM | POA: Diagnosis not present

## 2024-04-09 DIAGNOSIS — Z7982 Long term (current) use of aspirin: Secondary | ICD-10-CM | POA: Insufficient documentation

## 2024-04-09 DIAGNOSIS — Z940 Kidney transplant status: Secondary | ICD-10-CM | POA: Diagnosis not present

## 2024-04-09 DIAGNOSIS — Z794 Long term (current) use of insulin: Secondary | ICD-10-CM | POA: Insufficient documentation

## 2024-04-09 DIAGNOSIS — E875 Hyperkalemia: Secondary | ICD-10-CM | POA: Insufficient documentation

## 2024-04-09 DIAGNOSIS — Z992 Dependence on renal dialysis: Secondary | ICD-10-CM | POA: Insufficient documentation

## 2024-04-09 DIAGNOSIS — Z950 Presence of cardiac pacemaker: Secondary | ICD-10-CM | POA: Insufficient documentation

## 2024-04-09 DIAGNOSIS — Z79899 Other long term (current) drug therapy: Secondary | ICD-10-CM | POA: Insufficient documentation

## 2024-04-09 DIAGNOSIS — R7989 Other specified abnormal findings of blood chemistry: Secondary | ICD-10-CM | POA: Diagnosis present

## 2024-04-09 DIAGNOSIS — I120 Hypertensive chronic kidney disease with stage 5 chronic kidney disease or end stage renal disease: Secondary | ICD-10-CM | POA: Insufficient documentation

## 2024-04-09 LAB — BASIC METABOLIC PANEL WITH GFR
Anion gap: 6 (ref 5–15)
BUN: 49 mg/dL — ABNORMAL HIGH (ref 6–20)
CO2: 22 mmol/L (ref 22–32)
Calcium: 9.2 mg/dL (ref 8.9–10.3)
Chloride: 108 mmol/L (ref 98–111)
Creatinine, Ser: 2.25 mg/dL — ABNORMAL HIGH (ref 0.61–1.24)
GFR, Estimated: 33 mL/min — ABNORMAL LOW (ref >60)
Glucose, Bld: 91 mg/dL (ref 70–99)
Potassium: 5.8 mmol/L — ABNORMAL HIGH (ref 3.5–5.1)
Sodium: 136 mmol/L (ref 135–145)

## 2024-04-09 NOTE — ED Provider Notes (Signed)
  EMERGENCY DEPARTMENT AT [facility] Provider Note   CSN: [id number] Arrival date & time: 04/09/24  1530     History  Chief Complaint  Patient presents with   Abnormal Lab    [name] [name] is a 58 y.o. male.  Patient is a 58 year old male with a history of diabetes, hypertension, end-stage renal disease who had been on dialysis and got a renal transplant 1 year ago who is presenting today because he has been following up at [facility] and they have been weaning down on his medications he reports his potassium is been all over the place he had his blood work checked yesterday and was called today telling him his potassium was 6.4 and he needed to come to the emergency room for evaluation.  He reports that he feels fine he is still compliant with his medications and he is currently on Prograf and prednisone.  He reports he is eating and drinking well making urine and having no issues.  The history is provided by the patient and medical records.  Abnormal Lab      Home Medications Prior to Admission medications   Medication Sig Start Date End Date Taking? Authorizing Provider  Ascorbic Acid (VITAMIN C) 1000 MG tablet Take 1,000 mg by mouth daily.    [provider]  aspirin  81 MG tablet Take 81 mg by mouth at bedtime.    [provider]  b complex-vitamin c-folic acid  (NEPHRO-VITE) 0.8 MG TABS tablet Take 1 tablet by mouth daily.    [provider]  Blood Glucose Monitoring Suppl (ONETOUCH VERIO FLEX SYSTEM) w/Device KIT Use to check blood sugar twice daily. Patient taking differently: 1 each by Other route 2 (two) times daily. 04/21/20   Lajean Pike, MD  calcium  elemental as carbonate (BARIATRIC TUMS ULTRA) 400 MG chewable tablet Chew 2,000 mg by mouth 3 (three) times daily.    [provider]  carvedilol  (COREG ) 25 MG tablet Take 1.5 tablets (37.5 mg total) by mouth 2 (two) times daily with a meal. 03/25/17   Millicent Ally, MD   Continuous Blood Gluc Receiver (FREESTYLE LIBRE 14 DAY READER) DEVI 1 each by Does not apply route See admin instructions. Use to monitor blood sugars. 03/13/20   Lajean Pike, MD  Continuous Blood Gluc Sensor (DEXCOM G7 SENSOR) MISC 1 Device by Does not apply route as directed. Change sensor every 10 days 03/06/23   Lajean Pike, MD  doxycycline  (VIBRAMYCIN ) 100 MG capsule Take 1 capsule (100 mg total) by mouth 2 (two) times daily. One po bid x 7 days 01/18/24   Dalene Duck, MD  glucose blood test strip Use Onetouch verio test strips as instructed to check blood sugar twice daily. Patient taking differently: by Other route See admin instructions. Use Onetouch verio test strips as instructed to check blood sugar twice daily. 04/21/20   Lajean Pike, MD  ibuprofen  (ADVIL ) 600 MG tablet Take 1 tablet (600 mg total) by mouth every 6 (six) hours as needed. 12/07/21   Anda Bamberg, MD  insulin  aspart (NOVOLOG  FLEXPEN) 100 UNIT/ML FlexPen 5 to 7 units for breakfast and lunch and 10 to 14 units at dinner based on meal size and carbohydrateS CBG 70 - 120: 0 units CBG 121 - 150: 0 units CBG 151 - 200: 1 unit CBG 201-250: 1 units CBG 251-300: 2 units CBG 301-350: 3 units CBG 351-400: 4 units 03/05/23   Lajean Pike, MD  insulin  degludec (TRESIBA  FLEXTOUCH) 100 UNIT/ML  FlexTouch Pen Inject 35 Units into the skin daily. 03/05/23   Lajean Pike, MD  ivabradine  (CORLANOR ) 5 MG TABS tablet Take 5 mg by mouth 2 (two) times daily with a meal.    [provider]  lanthanum  (FOSRENOL ) 1000 MG chewable tablet Chew 1 tablet (1,000 mg total) by mouth 3 (three) times daily with meals. Patient taking differently: Chew 2,000 mg by mouth 3 (three) times daily with meals. 05/25/18   Deforest Fast, MD  menthol -cetylpyridinium (CEPACOL) 3 MG lozenge Take 1 lozenge (3 mg total) by mouth as needed for sore throat. 06/26/21   Kraig Peru, MD  methocarbamol  (ROBAXIN -750) 750 MG tablet Take 1 tablet (750 mg total) by mouth 4  (four) times daily. 12/07/21   Anda Bamberg, MD  OneTouch Delica Lancets 30G MISC Use onetouch delica lancets to check blood sugar twice daily. Patient taking differently: 1 each by Other route 2 (two) times daily. 04/21/20   Lajean Pike, MD  oxyCODONE  (ROXICODONE ) 5 MG immediate release tablet Take 1 tablet (5 mg total) by mouth every 4 (four) hours as needed for severe pain. 12/07/21   Anda Bamberg, MD  sacubitril -valsartan  (ENTRESTO ) 49-51 MG Take 1 tablet by mouth 2 (two) times daily.    [provider]      Allergies    Patient has no active allergies.    Review of Systems   Review of Systems  Physical Exam Updated Vital Signs BP (!) 155/82   Pulse 75   Resp 18   SpO2 100%  Physical Exam Vitals and nursing note reviewed.  Constitutional:      General: He is not in acute distress.    Appearance: He is well-developed.  HENT:     Head: Normocephalic and atraumatic.  Eyes:     Conjunctiva/sclera: Conjunctivae normal.     Pupils: Pupils are equal, round, and reactive to light.  Cardiovascular:     Rate and Rhythm: Normal rate and regular rhythm.     Pulses: Normal pulses.     Heart sounds: No murmur heard. Pulmonary:     Effort: Pulmonary effort is normal. No respiratory distress.     Breath sounds: Normal breath sounds. No wheezing or rales.     Comments: Pacemaker present in the anterior chest Abdominal:     General: There is no distension.     Palpations: Abdomen is soft.     Tenderness: There is no abdominal tenderness. There is no guarding or rebound.     Comments: No tenderness over the transplanted kidney  Musculoskeletal:        General: No tenderness. Normal range of motion.     Cervical back: Normal range of motion and neck supple.     Comments: Fistula present in the left upper extremity  Skin:    General: Skin is warm and dry.     Findings: No erythema or rash.  Neurological:     Mental Status: He is alert and oriented to person, place,  and time.  Psychiatric:        Behavior: Behavior normal.     ED Results / Procedures / Treatments   Labs (all labs ordered are listed, but only abnormal results are displayed) Labs Reviewed  BASIC METABOLIC PANEL WITH GFR - Abnormal; Notable for the following components:      Result Value   Potassium 5.8 (*)    BUN 49 (*)    Creatinine, Ser 2.25 (*)    GFR, Estimated 33 (*)  All other components within normal limits    EKG EKG Interpretation Date/Time:  Friday April 09 2024 15:39:27 EDT Ventricular Rate:  75 PR Interval:  149 QRS Duration:  148 QT Interval:  454 QTC Calculation: 508 R Axis:   -82  Text Interpretation: Electronic ventricular pacemaker No significant change since last tracing Confirmed by Almond Army ([id number]) on 04/09/2024 3:48:51 PM  Radiology No results found.  Procedures Procedures    Medications Ordered in ED Medications - No data to display  ED Course/ Medical Decision Making/ A&P                                 Medical Decision Making Amount and/or Complexity of Data Reviewed Labs: ordered. Decision-making details documented in ED Course.   Pt with multiple medical problems and comorbidities and presenting today with a complaint that caries a high risk for morbidity and mortality.  Presenting today due to an abnormal lab with hyperkalemia.  This was drawn yesterday.  Patient does have a history of renal transplant but based on [facility]'s records creatinine has not changed.  He is persistently taking his medication as prescribed.  Will first make sure this was not hemolysis.  I independently interpreted patient's EKG which shows a paced rhythm.  5:08 PM I independently interpreted patient's labs and repeat potassium here is 5.8 with preserved creatinine of 2.24 which is improved from prior.  Patient is on Veltassa already twice a day but we started discussing what he eats and he is eating yogurt and raisins regularly which are both high in  potassium which he will discontinue.  He will continue the Veltassa at this time and follow-up with the [facility] transplant team for repeat blood testing next week.  At this time he appears stable for discharge home.         Final Clinical Impression(s) / ED Diagnoses Final diagnoses:  Hyperkalemia    Rx / DC Orders ED Discharge Orders     None         Almond Army, MD 04/09/24 1708

## 2024-04-09 NOTE — Discharge Instructions (Signed)
 Your creatinine today was 2.25 and your potassium was 5.8.  Stop eating the yogurt and raisins.  Also take an extra dose of your Veltassa.

## 2024-04-09 NOTE — ED Triage Notes (Signed)
 Pt "just got off the phone w [facility] transplant team, I have high potassium. 6.4-6.6." pt denies associated symptoms  R kidney transplant 05/13/23, denies issue

## 2024-05-06 ENCOUNTER — Encounter (HOSPITAL_BASED_OUTPATIENT_CLINIC_OR_DEPARTMENT_OTHER): Payer: Self-pay | Admitting: Emergency Medicine

## 2024-05-06 ENCOUNTER — Emergency Department (HOSPITAL_BASED_OUTPATIENT_CLINIC_OR_DEPARTMENT_OTHER)
Admission: EM | Admit: 2024-05-06 | Discharge: 2024-05-06 | Disposition: A | Discharge status: Home / Self Care | Attending: Emergency Medicine | Admitting: Emergency Medicine

## 2024-05-06 ENCOUNTER — Other Ambulatory Visit: Payer: Self-pay

## 2024-05-06 DIAGNOSIS — Z79899 Other long term (current) drug therapy: Secondary | ICD-10-CM | POA: Diagnosis not present

## 2024-05-06 DIAGNOSIS — Z940 Kidney transplant status: Secondary | ICD-10-CM | POA: Insufficient documentation

## 2024-05-06 DIAGNOSIS — N186 End stage renal disease: Secondary | ICD-10-CM | POA: Insufficient documentation

## 2024-05-06 DIAGNOSIS — Z992 Dependence on renal dialysis: Secondary | ICD-10-CM | POA: Diagnosis not present

## 2024-05-06 DIAGNOSIS — Z794 Long term (current) use of insulin: Secondary | ICD-10-CM | POA: Diagnosis not present

## 2024-05-06 DIAGNOSIS — Z0000 Encounter for general adult medical examination without abnormal findings: Secondary | ICD-10-CM | POA: Insufficient documentation

## 2024-05-06 DIAGNOSIS — I5042 Chronic combined systolic (congestive) and diastolic (congestive) heart failure: Secondary | ICD-10-CM | POA: Insufficient documentation

## 2024-05-06 DIAGNOSIS — I132 Hypertensive heart and chronic kidney disease with heart failure and with stage 5 chronic kidney disease, or end stage renal disease: Secondary | ICD-10-CM | POA: Diagnosis not present

## 2024-05-06 DIAGNOSIS — E1122 Type 2 diabetes mellitus with diabetic chronic kidney disease: Secondary | ICD-10-CM | POA: Insufficient documentation

## 2024-05-06 LAB — BASIC METABOLIC PANEL WITH GFR
Anion gap: 11 (ref 5–15)
BUN: 69 mg/dL — ABNORMAL HIGH (ref 6–20)
CO2: 21 mmol/L — ABNORMAL LOW (ref 22–32)
Calcium: 8.7 mg/dL — ABNORMAL LOW (ref 8.9–10.3)
Chloride: 109 mmol/L (ref 98–111)
Creatinine, Ser: 3 mg/dL — ABNORMAL HIGH (ref 0.61–1.24)
GFR, Estimated: 23 mL/min — ABNORMAL LOW (ref >60)
Glucose, Bld: 166 mg/dL — ABNORMAL HIGH (ref 70–99)
Potassium: 5.2 mmol/L — ABNORMAL HIGH (ref 3.5–5.1)
Sodium: 141 mmol/L (ref 135–145)

## 2024-05-06 LAB — CBC WITH DIFFERENTIAL/PLATELET
Abs Immature Granulocytes: 0.29 10*3/uL — ABNORMAL HIGH (ref 0.00–0.07)
Basophils Absolute: 0 10*3/uL (ref 0.0–0.1)
Basophils Relative: 1 %
Eosinophils Absolute: 0.1 10*3/uL (ref 0.0–0.5)
Eosinophils Relative: 4 %
HCT: 31.7 % — ABNORMAL LOW (ref 39.0–52.0)
Hemoglobin: 10 g/dL — ABNORMAL LOW (ref 13.0–17.0)
Immature Granulocytes: 11 %
Lymphocytes Relative: 26 %
Lymphs Abs: 0.7 10*3/uL (ref 0.7–4.0)
MCH: 32.4 pg (ref 26.0–34.0)
MCHC: 31.5 g/dL (ref 30.0–36.0)
MCV: 102.6 fL — ABNORMAL HIGH (ref 80.0–100.0)
Monocytes Absolute: 0.4 10*3/uL (ref 0.1–1.0)
Monocytes Relative: 16 %
Neutro Abs: 1.1 10*3/uL — ABNORMAL LOW (ref 1.7–7.7)
Neutrophils Relative %: 42 %
Platelets: 115 10*3/uL — ABNORMAL LOW (ref 150–400)
RBC: 3.09 MIL/uL — ABNORMAL LOW (ref 4.22–5.81)
RDW: 12.9 % (ref 11.5–15.5)
WBC: 2.7 10*3/uL — ABNORMAL LOW (ref 4.0–10.5)
nRBC: 0 % (ref 0.0–0.2)

## 2024-05-06 NOTE — Discharge Instructions (Addendum)
 Your potassium today was 5.2.  Your creatinine was 3.0, your BUN was 69, and your GFR was 23.  Please call your transplant team today to let them know about these numbers.

## 2024-05-06 NOTE — ED Provider Notes (Signed)
 [place] EMERGENCY DEPARTMENT AT [facility] Provider Note  CSN: [id number] Arrival date & time: 05/06/24 4782  Chief Complaint(s) Abnormal Lab  HPI [name] [name] is a 58 y.o. male history of renal transplant, gets his care at [facility].  Patient had routine blood work drawn, was told that his potassium was 6.2 and to go to the emergency room.  Patient endorses good urinary output.     Past Medical History Past Medical History:  Diagnosis Date   Bacteremia    Diabetes mellitus    Dialysis patient [facility])    Hypertension    Pneumonia 02/2012   Strep pneumoniae bilateral pneumonia complicated by bacteremia   Renal disorder    End stage, not on dialysis   Patient Active Problem List   Diagnosis Date Noted   Acute appendicitis 12/06/2021   Mixed diabetic hyperlipidemia associated with type 2 diabetes mellitus (HCC) 06/23/2021   Abnormal liver enzymes 06/23/2021   Diabetic polyneuropathy associated with type 2 diabetes mellitus (HCC) 06/23/2021   NICM (nonischemic cardiomyopathy) (HCC) 05/17/2021   Chronic combined systolic and diastolic CHF (congestive heart failure) (HCC) 03/06/2021   Elbow pain 07/03/2018   Nausea vomiting and diarrhea 05/20/2018   Type 2 diabetes mellitus with diabetic polyneuropathy, with long-term current use of insulin  (HCC) 05/19/2018   Anemia of chronic disease 05/19/2018   Hypocalcemia 05/19/2018   HCAP (healthcare-associated pneumonia) 05/19/2018   ESRD on dialysis (HCC)    CKD (chronic kidney disease), stage V (HCC) 03/02/2018   Chronic diastolic CHF (congestive heart failure) (HCC) 03/02/2018   CAP (community acquired pneumonia) 03/02/2018   AKI (acute kidney injury) (HCC) 01/29/2017   Palpitations 01/13/2017   Essential hypertension 10/26/2013   Dyspnea 06/09/2012   Dizziness 06/09/2012   Pneumonia, organism unspecified(486) 04/07/2012   Pleural effusion 04/07/2012   Parapneumonic effusion 03/28/2012   Symptomatic anemia  03/28/2012   Hypoalbuminemia 03/28/2012   Total bilirubin, elevated 03/28/2012   Bilateral leg edema 03/23/2012   Cyst of skin 03/23/2012   Bacteremia due to Streptococcus pneumoniae 03/15/2012   Hyponatremia 03/15/2012   Acute kidney failure (HCC) 03/13/2012   Elevated LFTs 03/13/2012   Pneumococcal pneumonia (HCC) 03/12/2012   Insulin -requiring or dependent type II diabetes mellitus (HCC) 03/12/2012   Home Medication(s) Prior to Admission medications   Medication Sig Start Date End Date Taking? Authorizing Provider  amLODipine  (NORVASC ) 10 MG tablet Take 5 mg by mouth daily. 02/02/24 02/01/25 Yes [provider]  carvedilol  (COREG ) 12.5 MG tablet Take 25 mg by mouth 2 (two) times daily with a meal. 02/02/24 02/02/25 Yes [provider]  ferrous sulfate  325 (65 FE) MG tablet Take 325 mg by mouth every morning. 03/29/24  Yes [provider]  FIASP  FLEXTOUCH 100 UNIT/ML FlexTouch Pen See admin instructions. Inject 16 units for breakfast, 18 units for lunch, and 20 units for dinner + Correction (2 units 50>200). Inject before meals. Max Daily Dose 100 units 04/18/24  Yes [provider]  gabapentin  (NEURONTIN ) 100 MG capsule Take 1 capsule by mouth at bedtime. 02/18/23 03/27/25 Yes [provider]  isoniazid (NYDRAZID) 300 MG tablet Take 300 mg by mouth daily. 05/01/24  Yes [provider]  predniSONE (DELTASONE) 5 MG tablet Take by mouth. 04/28/24  Yes [provider]  tacrolimus (PROGRAF) 1 MG capsule Take by mouth See admin instructions. Take 3 capsules (3 mg total) by mouth every morning AND 2 capsules (2 mg total) every evening. 07/25/23 03/03/25 Yes [provider]  Ascorbic Acid (VITAMIN  C) 1000 MG tablet Take 1,000 mg by mouth daily.    [provider]  aspirin  81 MG tablet Take 81 mg by mouth at bedtime.    [provider]  atorvastatin  (LIPITOR) 20 MG tablet Take 20 mg by mouth daily.    [provider]  b complex-vitamin c-folic acid  (NEPHRO-VITE) 0.8 MG TABS tablet Take 1 tablet by mouth daily.    [provider]  Blood Glucose Monitoring Suppl (ONETOUCH VERIO FLEX SYSTEM) w/Device KIT Use to check blood sugar twice daily. Patient taking differently: 1 each by Other route 2 (two) times daily. 04/21/20   Lajean Pike, MD  calcium  elemental as carbonate (BARIATRIC TUMS ULTRA) 400 MG chewable tablet Chew 2,000 mg by mouth 3 (three) times daily.    [provider]  carvedilol  (COREG ) 25 MG tablet Take 1.5 tablets (37.5 mg total) by mouth 2 (two) times daily with a meal. 03/25/17   Millicent Ally, MD  Continuous Blood Gluc Receiver (FREESTYLE LIBRE 14 DAY READER) DEVI 1 each by Does not apply route See admin instructions. Use to monitor blood sugars. 03/13/20   Lajean Pike, MD  Continuous Blood Gluc Sensor (DEXCOM G7 SENSOR) MISC 1 Device by Does not apply route as directed. Change sensor every 10 days 03/06/23   Lajean Pike, MD  doxycycline  (VIBRAMYCIN ) 100 MG capsule Take 1 capsule (100 mg total) by mouth 2 (two) times daily. One po bid x 7 days 01/18/24   Dalene Duck, MD  famotidine (PEPCID) 20 MG tablet Take 20 mg by mouth 2 (two) times daily.    [provider]  glucose blood test strip Use Onetouch verio test strips as instructed to check blood sugar twice daily. Patient taking differently: by Other route See admin instructions. Use Onetouch verio test strips as instructed to check blood sugar twice daily. 04/21/20   Lajean Pike, MD  ibuprofen  (ADVIL ) 600 MG tablet Take 1 tablet (600 mg total) by mouth every 6 (six) hours as needed. 12/07/21   Anda Bamberg, MD  insulin  aspart (NOVOLOG  FLEXPEN) 100 UNIT/ML FlexPen 5 to 7 units for breakfast and lunch and 10 to 14 units at dinner based on meal size and carbohydrateS CBG 70 - 120: 0 units CBG 121 - 150: 0 units CBG 151 - 200: 1 unit CBG 201-250: 1 units CBG 251-300: 2 units CBG 301-350: 3 units CBG 351-400: 4 units  03/05/23   Lajean Pike, MD  insulin  degludec (TRESIBA  FLEXTOUCH) 100 UNIT/ML FlexTouch Pen Inject 35 Units into the skin daily. 03/05/23   Lajean Pike, MD  ivabradine  (CORLANOR ) 5 MG TABS tablet Take 5 mg by mouth 2 (two) times daily with a meal.    [provider]  lanthanum  (FOSRENOL ) 1000 MG chewable tablet Chew 1 tablet (1,000 mg total) by mouth 3 (three) times daily with meals. Patient taking differently: Chew 2,000 mg by mouth 3 (three) times daily with meals. 05/25/18   Deforest Fast, MD  menthol -cetylpyridinium (CEPACOL) 3 MG lozenge Take 1 lozenge (3 mg total) by mouth as needed for sore throat. 06/26/21   Kraig Peru, MD  methocarbamol  (ROBAXIN -750) 750 MG tablet Take 1 tablet (750 mg total) by mouth 4 (four) times daily. 12/07/21   Anda Bamberg, MD  OneTouch Delica Lancets 30G MISC Use onetouch delica lancets to check blood sugar twice daily. Patient taking differently: 1 each by Other route 2 (two) times daily. 04/21/20   Lajean Pike, MD  oxyCODONE  (ROXICODONE ) 5 MG  immediate release tablet Take 1 tablet (5 mg total) by mouth every 4 (four) hours as needed for severe pain. 12/07/21   Anda Bamberg, MD  pyridOXINE (VITAMIN B6) 50 MG tablet Take 50 mg by mouth daily.    [provider]  sacubitril -valsartan  (ENTRESTO ) 49-51 MG Take 1 tablet by mouth 2 (two) times daily.    [provider]  valGANciclovir (VALCYTE) 450 MG tablet Take 450 mg by mouth daily.    [provider]                                                                                                                                    Past Surgical History Past Surgical History:  Procedure Laterality Date   AMPUTATION TOE     AV FISTULA PLACEMENT Left 05/22/2018   Procedure: Creation of BrachiaCephalic Fistula Left Arm;  Surgeon: Dannis Dy, MD;  Location[facility] OR;  Service: Vascular;  Laterality: Left;   BASCILIC VEIN TRANSPOSITION Left 11/12/2018   Procedure:  BASILIC VEIN TRANSPOSITION LEFT ARM;  Surgeon: Margherita Shell, MD;  Location: [facility] OR;  Service: Vascular;  Laterality: Left;   CHEST TUBE INSERTION     placed during hospitalization 02/2012   CHOLECYSTECTOMY N/A 06/25/2021   Procedure: LAPAROSCOPIC CHOLECYSTECTOMY;  Surgeon: Lockie Rima, MD;  Location: [facility] OR;  Service: General;  Laterality: N/A;   COLONOSCOPY  04/01/2012   Procedure: COLONOSCOPY;  Surgeon: Tami Falcon, MD;  Location: [facility] ENDOSCOPY;  Service: Endoscopy;  Laterality: N/A;   ERCP N/A 06/24/2021   Procedure: ENDOSCOPIC RETROGRADE CHOLANGIOPANCREATOGRAPHY (ERCP);  Surgeon: Ozell Blunt, MD;  Location: [facility] ENDOSCOPY;  Service: Endoscopy;  Laterality: N/A;   ESOPHAGOGASTRODUODENOSCOPY  03/31/2012   Procedure: ESOPHAGOGASTRODUODENOSCOPY (EGD);  Surgeon: Almeda Aris, MD;  Location: [facility] ENDOSCOPY;  Service: Endoscopy;  Laterality: N/A;   EXCHANGE OF A DIALYSIS CATHETER Right 05/22/2018   Procedure: EXCHANGE OF A DIALYSIS CATHETER;  Surgeon: Dannis Dy, MD;  Location: [facility] OR;  Service: Vascular;  Laterality: Right;   IR FLUORO GUIDE CV LINE RIGHT  05/20/2018   IR THROMBECTOMY AV FISTULA W/THROMBOLYSIS INC/SHUNT/IMG LEFT Left 09/10/2018   IR US  GUIDE VASC ACCESS LEFT  09/10/2018   IR US  GUIDE VASC ACCESS RIGHT  05/20/2018   LAPAROSCOPIC APPENDECTOMY N/A 12/07/2021   Procedure: APPENDECTOMY LAPAROSCOPIC;  Surgeon: Anda Bamberg, MD;  Location: [facility] OR;  Service: General;  Laterality: N/A;   PANCREATIC STENT PLACEMENT  06/24/2021   Procedure: PANCREATIC STENT PLACEMENT;  Surgeon: Ozell Blunt, MD;  Location: [facility] ENDOSCOPY;  Service: Endoscopy;;   REMOVAL OF STONES  06/24/2021   Procedure: REMOVAL OF STONES;  Surgeon: Ozell Blunt, MD;  Location: [facility] ENDOSCOPY;  Service: Endoscopy;;   SPHINCTEROTOMY  06/24/2021   Procedure: [address];  Surgeon: Ozell Blunt, MD;  Location: [facility] ENDOSCOPY;  Service: Endoscopy;;   STENT REMOVAL  06/24/2021   Procedure: STENT REMOVAL;  Surgeon: Ozell Blunt, MD;  Location: [facility] ENDOSCOPY;  Service: Endoscopy;;   Family History Family History  Problem Relation Age of Onset   Diabetes Maternal Grandmother     Social History Social History   Tobacco Use   Smoking status: Never   Smokeless tobacco: Never  Vaping Use   Vaping status: Never Used  Substance Use Topics   Alcohol use: Yes    Comment: rare    Drug use: No   Allergies Nsaids  Review of Systems Review of Systems  Physical Exam Vital Signs  I have reviewed the triage vital signs BP (!) 115/55 (BP Location: Right Arm)   Pulse 68   Temp 98 F (36.7 C)   Resp 17   Ht 6\' 2"  (1.88 m)   Wt 88.5 kg   SpO2 100%   BMI 25.04 kg/m   Physical Exam Vitals reviewed.  Cardiovascular:     Rate and Rhythm: Normal rate.  Pulmonary:     Effort: Pulmonary effort is normal.  Abdominal:     General: Abdomen is flat. There is no distension.     Palpations: Abdomen is soft.     Tenderness: There is no abdominal tenderness. There is no guarding.  Neurological:     General: No focal deficit present.     Mental Status: He is alert.     ED Results and Treatments Labs (all labs ordered are listed, but only abnormal results are displayed) Labs Reviewed  CBC WITH DIFFERENTIAL/PLATELET - Abnormal; Notable for the following components:      Result Value   WBC 2.7 (*)    RBC 3.09 (*)    Hemoglobin 10.0 (*)    HCT 31.7 (*)    MCV 102.6 (*)    Platelets 115 (*)    Neutro Abs 1.1 (*)    Abs Immature Granulocytes 0.29 (*)    All other components within normal limits  BASIC METABOLIC PANEL WITH GFR - Abnormal; Notable for the following components:   Potassium 5.2 (*)    CO2 21 (*)    Glucose, Bld 166 (*)    BUN 69 (*)    Creatinine, Ser 3.00 (*)    Calcium  8.7 (*)    GFR, Estimated 23 (*)    All other components within normal limits                                                                                                                          Radiology No results  found.  Pertinent labs & imaging results that were available during my care of the patient were reviewed by me and considered in my medical decision making (see MDM for details).  Medications Ordered in ED Medications - No data to display  Procedures Procedures  (including critical care time)  Medical Decision Making / ED Course   This patient presents to the ED for concern of hyperkalemia, this involves an extensive number of treatment options, and is a complaint that carries with it a high risk of complications and morbidity.  The differential diagnosis includes hyperkalemia, decreased renal function, decreased potassium clearance.  MDM: Overall patient looks well.  His EKG does not show peaked T waves.  Will check a BMP on the patient to determine there was some degree of hemolysis.  Patient was seen here previously for this at the end of April.  His potassium here was 5.2.  His creatinine was a little bit higher at 3.0.  Patient tells me that he has often had creatinines at that level when he has follow-up with [facility].  Patient assures me that he will call his transplant team today with this update.  He is scheduled to see them on the 20th.  Will discharge patient.   Additional history obtained: -Additional history obtained from  -External records from outside source obtained and reviewed including: Chart review including previous notes, labs, imaging, consultation notes   Lab Tests: -I ordered, reviewed, and interpreted labs.   The pertinent results include:   Labs Reviewed  CBC WITH DIFFERENTIAL/PLATELET - Abnormal; Notable for the following components:      Result Value   WBC 2.7 (*)    RBC 3.09 (*)    Hemoglobin 10.0 (*)    HCT 31.7 (*)    MCV 102.6 (*)    Platelets 115 (*)    Neutro Abs 1.1 (*)    Abs Immature Granulocytes 0.29 (*)    All  other components within normal limits  BASIC METABOLIC PANEL WITH GFR - Abnormal; Notable for the following components:   Potassium 5.2 (*)    CO2 21 (*)    Glucose, Bld 166 (*)    BUN 69 (*)    Creatinine, Ser 3.00 (*)    Calcium  8.7 (*)    GFR, Estimated 23 (*)    All other components within normal limits      EKG paced rhythm, no evidence of acute ischemia  EKG Interpretation Date/Time:  Thursday May 06 2024 10:19:30 EDT Ventricular Rate:  68 PR Interval:  152 QRS Duration:  148 QT Interval:  474 QTC Calculation: 504 R Axis:   -75  Text Interpretation: Atrial-sensed ventricular-paced rhythm Biventricular pacemaker detected Abnormal ECG When compared with ECG of 09-Apr-2024 15:39, PREVIOUS ECG IS PRESENT Confirmed by Afton Horse [phone]) on 05/06/2024 10:29:44 AM         Medicines ordered and prescription drug management: No orders of the defined types were placed in this encounter.   -I have reviewed the patients home medicines and have made adjustments as needed   Cardiac Monitoring: The patient was maintained on a cardiac monitor.  I personally viewed and interpreted the cardiac monitored which showed an underlying rhythm of: Normal sinus rhythm  Social Determinants of Health:  Factors impacting patients care include: Multiple comorbidities including renal transplant   Reevaluation: After the interventions noted above, I reevaluated the patient and found that they have :improved  Co morbidities that complicate the patient evaluation  Past Medical History:  Diagnosis Date   Bacteremia    Diabetes mellitus    Dialysis patient [facility])    Hypertension    Pneumonia 02/2012   Strep pneumoniae bilateral pneumonia complicated by bacteremia   Renal disorder    End stage,  not on dialysis      Dispostion: I considered admission for this patient, however the patient is appropriate for outpatient follow-up with his transplant team.     Final Clinical  Impression(s) / ED Diagnoses Final diagnoses:  General medical exam     @PCDICTATION @    Afton Horse T, DO 05/06/24 1126

## 2024-05-06 NOTE — ED Triage Notes (Signed)
 Pt reports [facility] transplant team just called him and states potasium 6.2  Hx Kidney Transplant 05/14/23- voicing no complains.

## 2024-06-18 ENCOUNTER — Encounter (HOSPITAL_BASED_OUTPATIENT_CLINIC_OR_DEPARTMENT_OTHER): Payer: Self-pay | Admitting: Emergency Medicine

## 2024-06-18 ENCOUNTER — Emergency Department (HOSPITAL_BASED_OUTPATIENT_CLINIC_OR_DEPARTMENT_OTHER)
Admission: EM | Admit: 2024-06-18 | Discharge: 2024-06-18 | Disposition: A | Discharge status: Home / Self Care | Attending: Emergency Medicine | Admitting: Emergency Medicine

## 2024-06-18 ENCOUNTER — Encounter (HOSPITAL_COMMUNITY): Payer: Self-pay

## 2024-06-18 DIAGNOSIS — Y92410 Unspecified street and highway as the place of occurrence of the external cause: Secondary | ICD-10-CM | POA: Diagnosis not present

## 2024-06-18 DIAGNOSIS — Z7982 Long term (current) use of aspirin: Secondary | ICD-10-CM | POA: Insufficient documentation

## 2024-06-18 DIAGNOSIS — Z794 Long term (current) use of insulin: Secondary | ICD-10-CM | POA: Insufficient documentation

## 2024-06-18 DIAGNOSIS — R1031 Right lower quadrant pain: Secondary | ICD-10-CM | POA: Insufficient documentation

## 2024-06-18 LAB — URINALYSIS, ROUTINE W REFLEX MICROSCOPIC
Bilirubin Urine: NEGATIVE
Glucose, UA: NEGATIVE mg/dL
Hgb urine dipstick: NEGATIVE
Ketones, ur: NEGATIVE mg/dL
Nitrite: NEGATIVE
Protein, ur: 30 mg/dL — AB
Specific Gravity, Urine: 1.014 (ref 1.005–1.030)
pH: 6 (ref 5.0–8.0)
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
## 2024-06-18 NOTE — ED Triage Notes (Signed)
 Concerned R side kidney transplant stating kidney is RLQ abd.
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
## 2024-11-25 ENCOUNTER — Other Ambulatory Visit (HOSPITAL_BASED_OUTPATIENT_CLINIC_OR_DEPARTMENT_OTHER): Payer: Self-pay

## 2024-11-25 MED ORDER — COMIRNATY 30 MCG/0.3ML IM SUSY
0.3000 mL | PREFILLED_SYRINGE | Freq: Once | INTRAMUSCULAR | 0 refills | Status: AC
  Filled 2024-11-25: qty 0.3, 1d supply, fill #0
